# Patient Record
Sex: Male | Born: 1945 | Race: White | Hispanic: No | Marital: Married | Smoking: Former smoker
Health system: Southern US, Community
[De-identification: ages and names within clinical notes are randomized; demographics above are authoritative.]

## PROBLEM LIST (undated history)

## (undated) DIAGNOSIS — I2510 Atherosclerotic heart disease of native coronary artery without angina pectoris: Secondary | ICD-10-CM

## (undated) DIAGNOSIS — I4891 Unspecified atrial fibrillation: Secondary | ICD-10-CM

## (undated) DIAGNOSIS — I719 Aortic aneurysm of unspecified site, without rupture: Secondary | ICD-10-CM

## (undated) DIAGNOSIS — F329 Major depressive disorder, single episode, unspecified: Secondary | ICD-10-CM

## (undated) DIAGNOSIS — F32A Depression, unspecified: Secondary | ICD-10-CM

## (undated) DIAGNOSIS — I10 Essential (primary) hypertension: Secondary | ICD-10-CM

## (undated) DIAGNOSIS — J849 Interstitial pulmonary disease, unspecified: Secondary | ICD-10-CM

## (undated) DIAGNOSIS — C801 Malignant (primary) neoplasm, unspecified: Secondary | ICD-10-CM

## (undated) DIAGNOSIS — M3130 Wegener's granulomatosis without renal involvement: Secondary | ICD-10-CM

## (undated) DIAGNOSIS — E785 Hyperlipidemia, unspecified: Secondary | ICD-10-CM

## (undated) DIAGNOSIS — G4730 Sleep apnea, unspecified: Secondary | ICD-10-CM

## (undated) DIAGNOSIS — I5042 Chronic combined systolic (congestive) and diastolic (congestive) heart failure: Secondary | ICD-10-CM

## (undated) DIAGNOSIS — M1990 Unspecified osteoarthritis, unspecified site: Secondary | ICD-10-CM

## (undated) HISTORY — DX: Unspecified atrial fibrillation: I48.91

## (undated) HISTORY — PX: EYE SURGERY: SHX253

## (undated) HISTORY — DX: Chronic combined systolic (congestive) and diastolic (congestive) heart failure: I50.42

## (undated) HISTORY — DX: Essential (primary) hypertension: I10

## (undated) HISTORY — DX: Aortic aneurysm of unspecified site, without rupture: I71.9

## (undated) HISTORY — DX: Wegener's granulomatosis without renal involvement: M31.30

## (undated) HISTORY — PX: CORONARY ANGIOPLASTY: SHX604

## (undated) HISTORY — PX: CARPAL TUNNEL RELEASE: SHX101

---

## 1999-08-19 ENCOUNTER — Emergency Department (HOSPITAL_COMMUNITY): Admission: EM | Admit: 1999-08-19 | Discharge: 1999-08-19 | Payer: Self-pay | Admitting: Emergency Medicine

## 2001-05-26 ENCOUNTER — Ambulatory Visit (HOSPITAL_COMMUNITY): Admission: RE | Admit: 2001-05-26 | Discharge: 2001-05-27 | Payer: Self-pay | Admitting: Cardiology

## 2001-06-23 ENCOUNTER — Ambulatory Visit (HOSPITAL_COMMUNITY): Admission: RE | Admit: 2001-06-23 | Discharge: 2001-06-24 | Payer: Self-pay | Admitting: Cardiology

## 2001-06-23 ENCOUNTER — Encounter: Payer: Self-pay | Admitting: Cardiology

## 2002-06-03 ENCOUNTER — Inpatient Hospital Stay (HOSPITAL_COMMUNITY): Admission: EM | Admit: 2002-06-03 | Discharge: 2002-06-06 | Payer: Self-pay | Admitting: Emergency Medicine

## 2002-06-03 ENCOUNTER — Encounter: Payer: Self-pay | Admitting: Cardiology

## 2002-11-28 ENCOUNTER — Ambulatory Visit (HOSPITAL_COMMUNITY): Admission: RE | Admit: 2002-11-28 | Discharge: 2002-11-29 | Payer: Self-pay | Admitting: Cardiology

## 2003-03-08 ENCOUNTER — Ambulatory Visit (HOSPITAL_COMMUNITY): Admission: RE | Admit: 2003-03-08 | Discharge: 2003-03-08 | Payer: Self-pay | Admitting: Cardiology

## 2003-07-17 ENCOUNTER — Ambulatory Visit (HOSPITAL_COMMUNITY): Admission: RE | Admit: 2003-07-17 | Discharge: 2003-07-18 | Payer: Self-pay | Admitting: Cardiology

## 2003-12-20 ENCOUNTER — Ambulatory Visit: Payer: Self-pay | Admitting: Internal Medicine

## 2003-12-27 ENCOUNTER — Encounter: Admission: RE | Admit: 2003-12-27 | Discharge: 2003-12-27 | Payer: Self-pay | Admitting: Internal Medicine

## 2004-01-05 HISTORY — PX: CORONARY ARTERY BYPASS GRAFT: SHX141

## 2004-05-21 ENCOUNTER — Observation Stay (HOSPITAL_COMMUNITY): Admission: EM | Admit: 2004-05-21 | Discharge: 2004-05-29 | Payer: Self-pay | Admitting: Emergency Medicine

## 2004-10-01 ENCOUNTER — Ambulatory Visit: Payer: Self-pay | Admitting: Internal Medicine

## 2004-12-03 ENCOUNTER — Ambulatory Visit: Payer: Self-pay | Admitting: Internal Medicine

## 2004-12-08 ENCOUNTER — Ambulatory Visit: Payer: Self-pay | Admitting: Internal Medicine

## 2005-01-19 ENCOUNTER — Ambulatory Visit: Payer: Self-pay | Admitting: Internal Medicine

## 2005-03-01 ENCOUNTER — Ambulatory Visit: Payer: Self-pay | Admitting: Internal Medicine

## 2005-06-09 ENCOUNTER — Ambulatory Visit: Payer: Self-pay | Admitting: Internal Medicine

## 2005-12-23 IMAGING — CR DG CHEST 1V PORT
1 series · 1 of 1 positions shown · non-contrast
Comparison: 05/25/2004

CLINICAL DATA: CABG

PORTABLE CHEST - 1 VIEW:

[view not recorded]
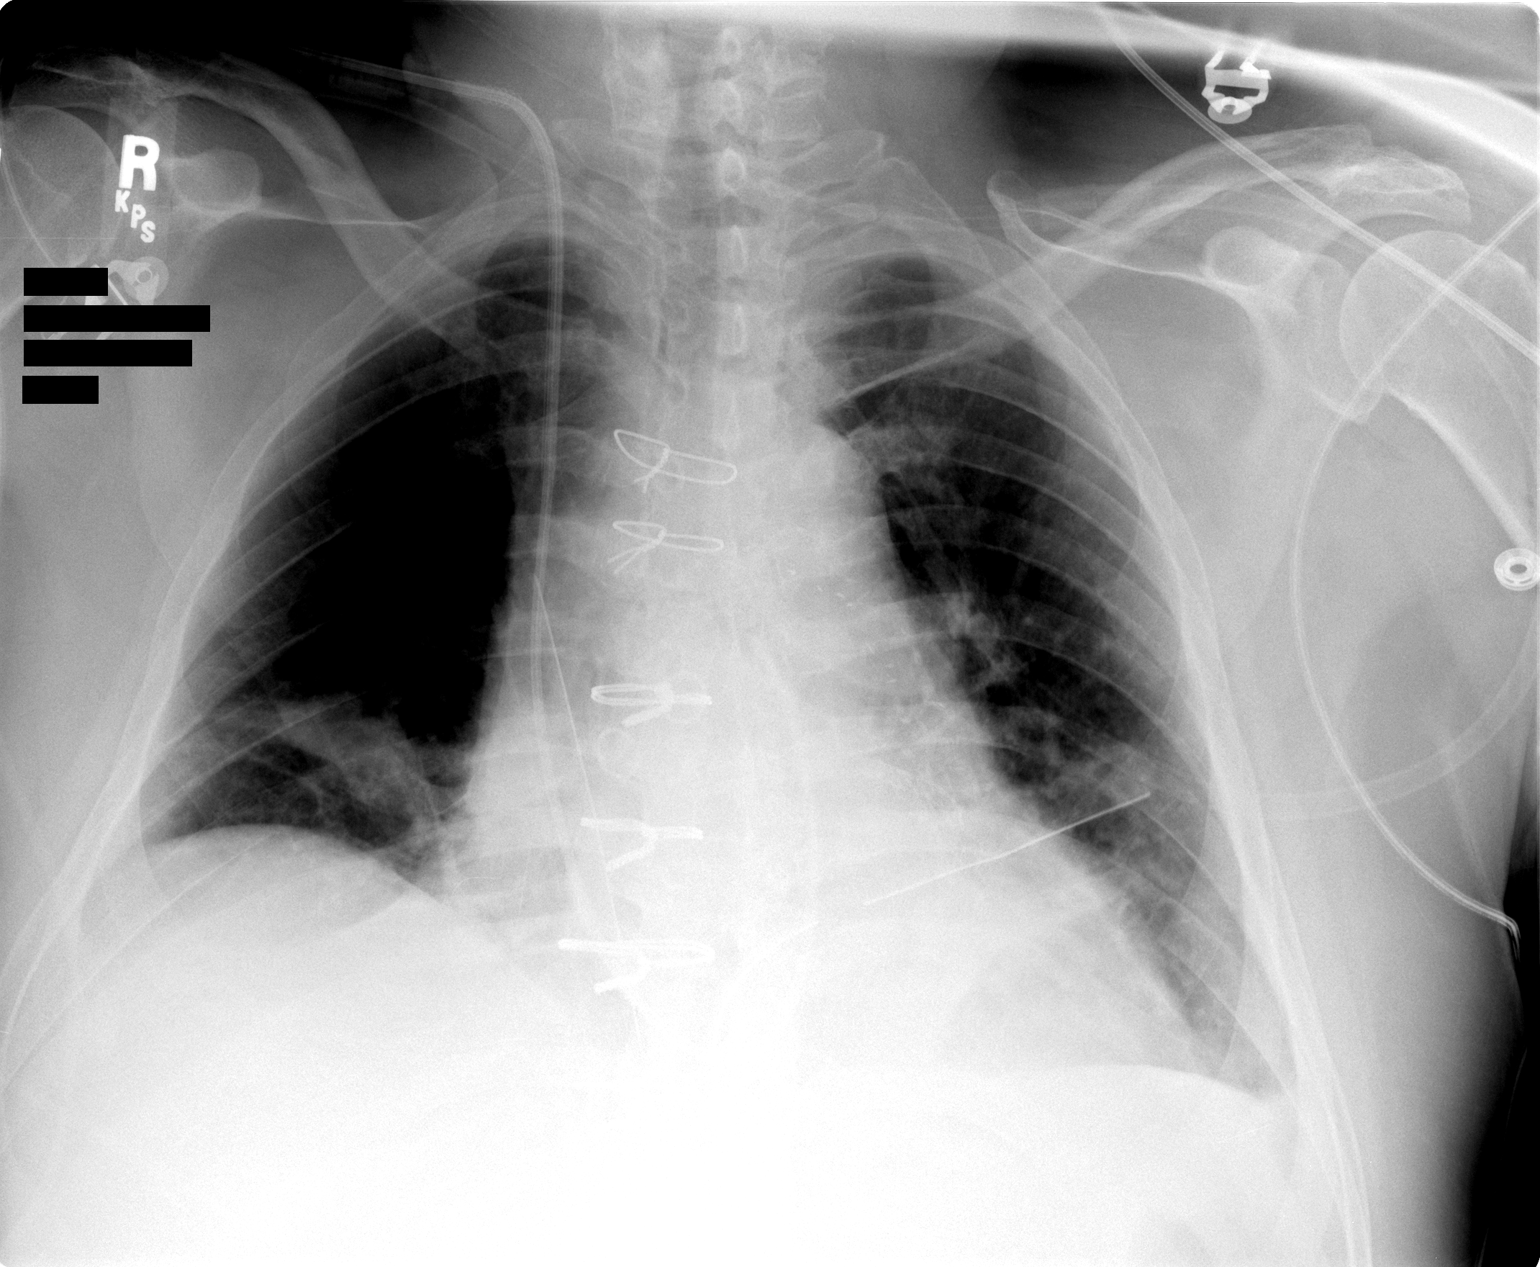

[1 of 1 positions shown; findings below may reference images not displayed]

FINDINGS: Interval removal of endotracheal tube and NG tube. The chest tube
remains in place without pneumothorax. Bibasilar atelectasis again noted. Small
left effusion likely present.
IMPRESSION: Interval intubation. Stable bibasilar atelectasis.

## 2006-11-22 ENCOUNTER — Encounter: Admission: RE | Admit: 2006-11-22 | Discharge: 2006-11-22 | Payer: Self-pay | Admitting: Internal Medicine

## 2010-02-05 ENCOUNTER — Other Ambulatory Visit: Payer: Self-pay | Admitting: Dermatology

## 2010-05-22 NOTE — Cardiovascular Report (Signed)
Gary [name], Gary [name]             ACCOUNT NO.:  [id number]   MEDICAL RECORD NO.:  [id number]          PATIENT TYPE:  INP   LOCATION:  3737                         FACILITY:  [facility]   PHYSICIAN:  Lyn Records III, M.D.DATE OF BIRTH:  [birth year]   DATE OF PROCEDURE:  05/22/2004  DATE OF DISCHARGE:                              CARDIAC CATHETERIZATION   INDICATION FOR CARDIAC CATHETERIZATION:  Unstable angina, progressive over  the past 2 weeks in this patient with history of extensive mid and distal  RCA stenting and proximal and mid LAD stenting primarily, and on 2  subsequent occasions for restenosis.   PROCEDURE PERFORMED:  1.  Left heart catheterization  2.  Selective coronary angiography.  3.  Left ventriculography.  4.  AngioSeal arteriotomy closure.   DESCRIPTION:  After informed consent, a 6-French sheath was placed in the  right femoral artery using a modified Seldinger technique.  A 6-French A2  multipurpose catheter was then used for hemodynamic recordings, left  ventriculography by hand injection, and selective right coronary  angiography.  A #4 left Judkins catheter was used for left coronary  angiography.  The patient tolerated the procedure without complications.  Following digital review the angiogram images, the case was terminated and  AngioSeal arteriotomy closure was performed without complications.   RESULTS:   I. HEMODYNAMIC DATA:  A.  Aortic pressure 155/79.  B.  Left ventricular pressure 156/25.   II. LEFT VENTRICULOGRAPHY:  Left ventricular cavity size and function are  normal.  EF is 55% to 65%.  No MR is noted.   III. CORONARY ANGIOGRAPHY:  A.  Left main coronary:  Widely patent.  B.  Left anterior descending coronary:  The LAD is severely and diffusely  diseased throughout the entire stented proximal and mid-vessel region.  There are multiple overlapping stents within this region.  Some of the  regions have 2 layers of stents.  The degree of  stenosis in multiple sites  is greater than 90%.  Flow to the distal LAD is TIMI grade 2.5 to 3.  Collaterals from the right coronary to the LAD are present.  The first  diagonal contains 90+ percent ostial narrowing.  C.  Circumflex artery:  The circumflex is a small- to moderate-sized vessel  that contains 60% to 70% proximal stenosis.  The circumflex gives origin to  a small first obtuse marginal.  D.  Ramus intermedius branch:  The ramus intermedius contains segmental 50%  proximal narrowing and 50% mid-narrowing.  No high-grade obstruction is  seen.  E.  Right coronary:  The right coronary is a large dominant vessel giving  PDA and at least 2 significant-sized left ventricular branches.  Proximal to  the long region of stents in the right coronary is concentric, relatively  focal 98% stenosis.  TIMI grade 3 flow is noted.   CONCLUSION:  1.  Severe two-vessel coronary artery disease with diffuse in-stent      restenosis involving the proximal and mid left anterior descending      stented regions and also high-grade obstruction in the proximal right      coronary  at the origin of stents within this vessel.  The ramus      intermedius contains moderate proximal disease and the circumflex is      small and also contains moderate proximal disease.  2.  Normal left ventricular function.  3.  Successful Angio-Seal.   PLAN:  CVTS consultation for surgical revascularization.       HWS/MEDQ  D:  05/22/2004  T:  05/23/2004  Job:  [id number]   cc:   Titus Dubin. Alwyn Ren, M.D. [facility]

## 2010-05-22 NOTE — H&P (Signed)
NAMEJAMY, CLECKLER NO.:  [id number]   MEDICAL RECORD NO.:  [id number]          PATIENT TYPE:  EMS   LOCATION:  MAJO                         FACILITY:  [facility]   PHYSICIAN:  Lyn Records, M.D.   DATE OF BIRTH:  1945/10/04   DATE OF ADMISSION:  05/21/2004  DATE OF DISCHARGE:                                HISTORY & PHYSICAL   ADMITTING PHYSICIAN:  Lyn Records, M.D.   PRIMARY CARDIOLOGIST:  [name], M.D.   PRIMARY CARE PHYSICIAN:  Titus Dubin. Alwyn Ren, M.D.   CHIEF COMPLAINT:  Chest pain.   HISTORY OF PRESENT ILLNESS:  Mr. Schappell is a 65 year old male patient of  Dr. Amil Amen, last seen in the office in December of 2005.  He has a history  of typically exertional chest pain when he has required any type of coronary  intervention.  He has had a PCI and stent to the distal RCA in 2003, PCI and  stent to the RCA and LAD in November of 2004, and a PCI and stent to the  proximal mid and distal LAD in July of 2005.  Please refer to those  respective cath reports for details.  He comes into the ER today complaining  of a 2-week history of intermittent chest pain, left anterior chest,  radiating to the left axilla with no specific pattern.  He says sometimes  the pain will occur at rest, sometimes it occurs with exertion, sometimes he  can do the same exertion without experiencing a reproduction of this pain.  He does report that this chest pain is similar to his chest pain at present  in July of 2005, in which he again had stent placement done.  In the past  one week, he has also noticed some increased chest pain after eating.  This  pain usually goes away with rest.  If it does not go away within 3 minutes,  he will take a nitroglycerin, and each time he has taken a nitroglycerin,  the pain has resolved.  He has had no episodes of prolonged chest pain  greater than 3 minutes for several hours.  Over the past 2 nights, he has  slept in a chair secondary to  onset of chest pain when supine.  He has not  had any associated symptoms such as shortness of breath, diaphoresis, or  nausea with this pain.  No dizziness or tachypalpitations.  At the follow up  in December of 2005 with Dr. Amil Amen, he was scheduled for an outpatient  stress Cardiolite to be done in early 2006, but he had to cancel this due to  some work constraints.  The EKG that was done in the ER is actually improved  when compared to the EKG in July of 2005.  He is currently chest pain free  since he took nitroglycerin prior to arrival in the ER.  No additional  laboratory analysis has been done by the ER physician.   REVIEW OF SYSTEMS:  Noncontributory.  No constitutional symptoms.  No recent  fevers, chills, coughs, cold, or flu.  No myalgias.  No  reflux or  indigestion.  No abdominal pain.  No dark/tarry stools.  No bloody stools.  No hematuria.  No dysuria.  No significant lower extremity swelling.  Some  mild trace swelling over the past 24 hours, mainly were the socks come in at  the calves.  Again, no dizziness, syncope, near syncope, tachypalpitations,  dyspnea on exertion, or orthopnea.   FAMILY MEDICAL HISTORY:  Positive for both parents with CAD.   SOCIAL HISTORY:  The patient is married.  His wife works here at [employer].  He is a former tobacco user, and does not drink alcohol.   PAST MEDICAL HISTORY:  1.  Stent to distal RCA in 2003.  2.  Stent to the RCA and LAD in November 2004.  3.  Stent to the proximal mid and distal LAD in July of 2005.  4.  Normal left ventricular ejection fraction of 66% per cath in July of      2005.  5.  Hypertension.  6.  Bradycardia secondary to medications.  7.  Dyslipidemia.  8.  Obesity.  9.  Former tobacco abuse.  10. Hypothyroidism.   ALLERGIES:  No known drug allergies.   CURRENT MEDICATIONS:  1.  Synthroid 50 mcg daily.  2.  Norvasc 5 mg daily.  3.  Lipitor 20 mg daily.  The patient states he thought Dr. Amil Amen  had told      him to double up on this medicine, so he is taking it twice daily.  4.  Zetia 10 mg daily.  5.  Coreg 12.5 mg b.i.d.  6.  Lexapro 10 mg daily.  7.  Stopped Plavix in January of 2006.  8.  Sublingual nitroglycerin p.r.n.   PHYSICAL EXAMINATION:  GENERAL:  Pleasant, well-developed male currently  without chest pain.  VITAL SIGNS:  Temperature 98.7, blood pressure 147/82, pulse 58 and regular,  respirations 18.  HEENT:  Head is normocephalic.  Sclerae are noninjected.  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  NECK:  Supple without adenopathy.  NEUROLOGIC:  The patient is alert and oriented x3, moving all extremities  x4.  No focal neurological deficits.  Strength is 5/5 and symmetrical.  Gait  was not assessment.  CHEST:  Bilateral lung sounds are clear to auscultation posteriorly.  Respiratory effort is non-labored.  CARDIAC:  Heart sounds are S1 and S2.  No rubs, murmurs, thrills, or  gallops.  No JVD.  Carotids 2+ bilaterally without bruits.  ABDOMEN:  Obese, soft, nontender, nondistended, with bowel sounds present in  all four quadrants.  No obvious hepatosplenomegaly, masses, or bruits.  EXTREMITIES:  Symmetrical in appearance with trace edema in the legs;  otherwise, no edema.  Pulses are palpable at 1+ bilaterally, radial,  femoral, and pedal.   LABORATORY DATA:  None have been ordered in the ER.   DIAGNOSTICS:  EKG in the ER today shows sinus bradycardia, ventricular rate  55, __________ R waves in the septal leads.  No ischemic changes.  EKG from  July of 2005 revealed sinus bradycardia with subtle ST elevation and T wave  inversion in V2 and V3.  Myocardial perfusion studies from 2003 showed a  fixed partially reversible inferior wall defect.  Myocardial perfusion study  from July of 2005 revealed the patient had positive stress changes on the EKG, which were not described in the body of the catheterization report, and  I do not have  that actual perfusion study report available at the time  of  dictation.  He also had widespread reversible anteroseptal and apical defect  noted on the perfusion studies.   IMPRESSION:  1.  Chest pain, probable unstable angina, in a patient with known two vessel      coronary artery disease, currently chest pain free.  2.  Hypertension.  3.  Dyslipidemia.  4.  Former tobacco abuse.  5.  Hypothyroidism.  6.  Obesity.  7.  Normal left ventricular ejection fraction with normal systolic function,      ejection fraction 66%.  8.  Bradycardia secondary to medications.   PLAN:  1.  Admit patient to the telemetry unit to Dr. Katrinka Blazing in Dr. Amil Amen'      absence.  2.  Perform serial cardiac isoenzymes and a stress Cardiolite in the      morning.  3.  Continue his home medications; add aspirin.  4.  Lovenox subcutaneous q.12h. per pharmacy.  5.  P.R.N. sublingual nitroglycerin; if has recurrent episodes of chest      pain, will need to add IV nitroglycerin instead.  6.  Will go ahead and check a fasting lipid panel since this is due.  7.  Get baseline CBC, CMET, PT, PTT, and two view chest x-ray.       ALE/MEDQ  D:  05/21/2004  T:  05/21/2004  Job:  [id number]   cc:   Titus Dubin. Alwyn Ren, M.D. [facility]

## 2010-05-22 NOTE — Discharge Summary (Signed)
Gary [name], Gary [name]             ACCOUNT NO.:  [id number]   MEDICAL RECORD NO.:  [id number]          PATIENT TYPE:  INP   LOCATION:  2001                         FACILITY:  [facility]   PHYSICIAN:  Salvatore Decent. [name], M.D.DATE OF BIRTH:  Feb 18, 1945   DATE OF ADMISSION:  05/21/2004  DATE OF DISCHARGE:  05/29/2004                                 DISCHARGE SUMMARY   HISTORY OF PRESENT ILLNESS:  The patient is a 65 year old male patient of  Dr. Amil Amen who is seen in the office in December 2005.  He has a history of  typically exertional chest pain when he has required any type of coronary  intervention.  He has had a PCI and stent of the distal right coronary  artery in 2003, PCI and stent of the right coronary artery and LAD in  November 2004, and a PCI and stent of the proximal mid and distal LAD in  July 2005.  He presented to the emergency room on the day of admission  complaining of a two week history of intermittent chest pain in the left  anterior chest with radiation into left axilla with no noted specific  pattern.  Sometimes, the pain would occur at rest, sometimes with exertion,  but at other times a similar exertion would not result with pain.  The pain  is, however, described as similar to the chest pain on presentation in July  2005 at which time he had a stent placed.  In the past week prior to  admission, he noticed some increased chest pain after eating.  The pain  usually went away with rest.  If the pain did not go away within three  minutes, he would take nitroglycerin and each time he took this, the pain  fully resolved.  He had no episodes of prolonged chest pain greater than  three minutes for several hours.  On the two nights prior to admission, he  had slept in a chair secondary to the onset of chest pain when supine.  He  did not have associated symptoms such as shortness of breath, diaphoresis,  or nausea with this pain.  He denied dizziness or tachy  palpitations.  He  was noted to have an outpatient stress Cardiolite to be done in early 2006  but this was cancelled due to work constraints.  EKG done in the emergency  room  was actually improved when compared to an EKG done in July 2005.  He  was felt to require admission, seen in consultation by Dr. Katrinka Blazing, and  admitted to the hospital.   PAST MEDICAL HISTORY:  1.  Coronary artery disease as described above.  2.  Left ventricular  ejection fraction 66% on catheterization July 2005.  3.  Hypertension.  4.  History of bradycardia secondary to medications.  5.  Dyslipidemia.  6.  Obesity.  7.  Former tobacco abuse.  8.  Hypothyroidism.   ALLERGIES:  No known drug allergies.   MEDICATIONS:  1.  Synthroid 50 mcg daily.  2.  Norvasc 5 mg daily.  3.  Lipitor 20 mg b.i.d.  4.  Zetia  10 mg daily.  5.  Coreg 12.5 mg b.i.d.  6.  Lexapro 10 mg daily.  7.  Stopped taking Plavix in January 2006.  8.  Sublingual nitroglycerin p.r.n.   For family history, social history, review of systems, and physical exam,  please see the history and physical done at the time of admission.   HOSPITAL COURSE:  The patient was admitted to the hospital for further  evaluation and treatment as described.  The patient was felt to require CVTS  evaluation.  The patient was seen initially by Dr. Laneta Simmers and subsequently  referred to Dr. [name] due to scheduling.  A cardiac catheterization  was done on May 22, 2004.  This showed several findings including diffuse  LAD in stent restenosis and severe proximal right coronary lesion, mild to  moderate circumflex proximal disease, and normal left ventricular function.  After reviewing these findings, the procedure was scheduled and on May 25, 2004, was taken to the operating room where he underwent coronary artery  bypass grafting x 3 with the following grafts placed:  1.  Left internal mammary artery to the LAD.  2.  Saphenous vein graft to the posterior  descending artery.  3.  Saphenous vein graft to the obtuse marginal.   The patient tolerated the procedure well and was taken to the surgical  intensive care unit in stable condition.  Postoperative hospital course, the  patient did quite well.  He remained hemodynamically stable.  He was weaned  from the ventilator without difficulties.  His laboratory values revealed a  moderate postoperative anemia.  His most recent hemoglobin and hematocrit  May 28, 2004, are 8.9 and 26 respectively.  The patient is tolerating a  gentle diuresis which will continue post discharge.  All incisions are  healing well without evidence of infection.  He is tolerating routine  activities commiserate with level of postoperative convalescence,  progressing well with cardiac rehabilitation phase 1 modalities.  The  patient's routine lines, monitors, and drains have all been discontinued in  a standard fashion.  Overall, he is felt to be stable for tentative  discharge on the morning of May 29, 2004, pending morning round evaluation.   MEDICATIONS ON DISCHARGE:  1.  Lipitor 40 mg daily.  2.  Lexapro 10 mg daily.  3.  Zetia 10 mg daily.  4.  Synthroid 50 mcg daily.  5.  Aspirin 325 mg daily.  6.  Coreg 6.25 mg b.i.d.  7.  Lasix 40 mg daily for seven days.  8.  K-Dur 20 mEq daily for seven days.  9.  Ultram 50 mg q.4-6h. p.r.n. needed for pain.   DISCHARGE INSTRUCTIONS:  The patient is to follow up with Dr. Amil Amen in two  weeks, Dr. [name] on June 19, 2004, at 11:45.  The patient received  written instructions regarding medications, activity, diet, wound care, and  follow up.   FINAL DIAGNOSIS:  Severe multi-vessel coronary artery disease as described  status post multiple percutaneous procedures and now status post surgical  revascularization.   OTHER DIAGNOSES:  1.  Hypertension.  2.  Dyslipidemia.  3.  Obesity.  4.  Former tobacco abuse. 5.  Hypothyroidism.  6.  Postoperative  anemia.      WEG/MEDQ  D:  05/28/2004  T:  05/28/2004  Job:  [id number]   cc:   Gary [name]. Alwyn Ren, M.D. [facility]   [name], M.D.  [address]  [address]  [place]  [place] [address]  Fax: [phone]

## 2010-05-22 NOTE — Cardiovascular Report (Signed)
NAME:  Gary [name], Gary [name]                       ACCOUNT NO.:  [id number]   MEDICAL RECORD NO.:  [id number]                   PATIENT TYPE:  OIB   LOCATION:  6525                                 FACILITY:  [facility]   PHYSICIAN:  [name], M.D.               DATE OF BIRTH:  12/16/1945   DATE OF PROCEDURE:  07/17/2003  DATE OF DISCHARGE:                              CARDIAC CATHETERIZATION   PROCEDURE PERFORMED:  1. Left heart catheterization.  2. Coronary angiography.  3. Left ventriculogram.  4. PCI/drug-eluting stent implantation x2.  5. Intravascular ultrasound.  6. Percutaneous closure of right femoral artery.   INDICATION:  Mr. Gary [name] is an unfortunate 65 year old man who has  two-vessel coronary disease and is status post three previous interventions  in the anterior descending artery and two interventions in the right  coronary artery.  Despite having a reasonably normal appearing angiogram  with wide patency of the stented segments in early March 2005 at which time  he was complaining of relatively typical angina he has had a recrudescence  and then a worsening of his angina pectoris over the past month. A  myocardial perfusion study with exercise in the office was positive  electrocardiographically and clinically.  As well, there was a relatively  widespread reversible anteroseptal and apical defect.  The more apical  portion of the defect, however, was relatively fixed.  He is brought now to  the cardiac catheterization laboratory again to identify progressive  disease, restenosis, and provide for further therapeutic options.   PROCEDURAL NOTE:  The patient was brought to the cardiac catheterization  laboratory in the fasting state.  The right groin was prepped and draped in  the usual sterile fashion.  Local anesthesia was obtained with the  infiltration of 1% lidocaine.  A 5 French catheter sheath was inserted  percutaneously into the right femoral artery  utilizing an anterior approach  over a guiding J wire.  A 110-cm pigtail catheter was used to measure  pressures in the ascending aorta and in the left ventricle both prior to and  following the ventriculogram.  A 30-degree RAO cine left ventriculogram was  performed utilizing a power injector.  42 mL of contrast material were  injected at 13 mL per second.  The pigtail catheter was then exchanged for a  5 French #4 left Judkins catheter.  Cineangiography of the left coronary  artery was conducted in multiple LAO and RAO projections.  The left Judkins  catheter was then exchanged for a 6 French #4 right Judkins catheter.  Cineangiography of the right coronary artery was conducted in multiple LAO  and RAO projections.  All catheter manipulations were performed using  fluoroscopic observation and exchanges performed over long guiding J wire.   I reviewed the cineangiograms and previous studies and then discussed the  clinical situation with the patient.  There was focal and diffuse restenosis  in the mid  and distal portion of the extensively stented segment in the left  anterior descending artery as would be expected.  Options were given for  repeat (#4) percutaneous intervention versus single vessel coronary bypass.  Left circumflex and right coronary arteries while diseased were widely  patent.  It was discussed that the likelihood of re-restenosis in this  vessel is in the range of 30-40% despite an adequate angiographic result.  He still elected and wished to pursue an interventional option.  Therefore,  we prepared for percutaneous coronary intervention.   The 5 French catheter sheath was exchanged for a 7 French catheter sheath.  The patient received 5500 units of heparin intravenously and a bolus of  Aggrastat at 25 mcg/kg followed by a constant infusion of 0.1 mcg/kg/minute.  This resulted in an ACT of 263 seconds.  A 7 [place] CLS #4 guiding catheter  was advanced through the  ascending aorta where the left coronary os was  engaged.  A 0.014 inch Scimed Luge intracoronary guide wire was passed  across the lesion without difficulty.  Initial balloon dilatation was  performed with a 3.0/15 mm Scimed Maverick intracoronary balloon.  This was  inflated to 8 atmospheres for approximately one minute.  This balloon was  removed and intravascular ultrasound performed with a Scimed Altantis  catheter. A full mechanical pullback run was obtained.  This revealed  extensive atherosclerotic disease which was physiologically significant in  the very distal portion of the stent and extending slightly distal from that  point.  The stented segment measured in the 2.5 x 3.0 range throughout the  remainder.  There was extensive in-stent restenosis tissue present.  I then  proceeded with cutting balloon angioplasty.  A 3.25/15 mm cutting balloon  was advanced into the distal segment and allowed to extend slightly beyond  that to treat the more distal native lesion.  He was then inflated there to  5 atmospheres for approximately 2 minutes.  It was deflated and withdrawn  and inflated four more times through the extended portion of the stent which  was over at least 40 mm.  Each inflation was 8 atmospheres for approximately  2 minutes.  The cutting balloon was removed and initial angiography revealed  what appeared to be a degree of tight coronary spasm distal to the stented  segment.  However, it was resistant to intracoronary nitroglycerin and  intracoronary Verapamil.  The 3.0/15 mm balloon was advanced to this portion  which was distal to the stented segment and inflated to approximately 2-3  atmospheres for approximately one minute.  This balloon was deflated and  removed.  However, it did not resolve what was presumed to be intracoronary  spasm.  In fact, it was not obvious that this was a distal dissection.  Therefore, the Maverick balloon was removed and I prepared for  intracoronary stenting.  A 3.0/32 mm Taxus intracoronary drug-eluting stent was then  advanced into place and extended distally to cover the distal dissection.  It was inflated at this region to 9 atmospheres for approximately one  minute.  This Taxus balloon was again inflated to 9 atmospheres again for  about 30 seconds and then it was removed.  A second Taxus intracoronary  stent measuring 3.5 x 28-mm was advanced in the more proximal section of the  lesion and inflated there to 14 atmospheres for approximately one minute.  Intravascular ultrasound was repeated.  These images revealed wide stent  patency and good apposition with luminal dimensions measuring from  2.7 to  3.1 mm distally to 3.0 x 3.0 mm proximally.  Therefore, a 3.25/15 mm [place]  Monorail balloon was advanced into the distal segment and inflated twice  there to a peak pressure of 16 atmospheres for approximately 30 seconds  each.  This was removed and exchanged for a 3.5/15 mm [place] Monorail which was  inflated in the proximal segment twice to 16 atmospheres for approximately  30 seconds each.  Intravascular ultrasound was repeated and there was  improved luminal dimension especially throughout the more proximal and mid  portion of the stented segment which now extended about 60 mm.  The lumen  measured 3.4 x 3.3 proximally, 3.2 x 3.1 in the mid portion.  However,  distally it was still in the 2.6 x 2.6 mm range.  Therefore, the 3.25/15 mm  [place] Monorail was readvanced in the distal segment.  However, it ruptured at  approximately 8 atmospheres.  It was therefore removed and a 3.25/9 mm [place]  Monorail (only length available in this size) was advanced in the distal  segment and inflated three times over approximately 15 mm of stent to a  maximum pressure of 16 atmospheres.   These maneuvers ultimately resulted in no significant stenosis being evident  angiographically.  In fact, the stent appeared to taper nicely from about  3.5 mm  to 3.0 mm distally.  There was TIMI-3 flow.  The patient received an  additional mcg of IC nitroglycerin and repeat angiography performed both  with and without guide wire in place orthogonally.  The guiding catheter was  then removed and a 45-degree RAO cineangiogram of the femoral artery was  performed via the catheter sheath after it was flushed well.  Final ACT was  obtained and found to be 222 seconds.  The right femoral cineangiogram  documented the arteriotomy site to be well above the bifurcation into the  profunda femoris and superficial femoral arteries.  There was no significant  atherosclerotic disease or obstruction in the femoral artery.  I then  proceeded with percutaneous closure utilizing the Angio-Seal device which  was entirely successful.  The patient was then transferred to the recovery  area in stable condition with an intact distal pulse.   HEMODYNAMICS:  Systemic arterial pressure was 135/75 with a mean of 100 mmHg.  There was no systolic gradient across the aortic valve.  The left  ventricular end-diastolic pressure was 16 mmHg pre ventriculogram.   ANGIOGRAPHY:  The left ventriculogram demonstrated normal chamber size and  normal global systolic function with apical akinesis present.  A widely  stented segment in the proximal and mid LAD as well as proximal mid and  distal right coronary were obvious.  There was no significant coronary  calcification.  The aortic valve was trileaflet and opened normally during  systole.  There was no mitral regurgitation.  The calculated ejection  fraction utilizing a single plane cine method was 66%.   There was a right dominant system present.  The left main left coronary  contained a 30% mid vessel focal stenosis.   The left anterior descending artery and its branches were highly diseased.  The stented segment which extends over about 35-40 mm contained a 99% mid  portion stenosis and diffuse restenosis in the more distal  segment of the  stent extending over about 15 mm.  However, this was underfilled because of  the tight more proximal stenosis.  Several small diagonals and septal  perforators arise all of which have  ostial disease especially within the  stented segment.  The ongoing anterior descending was underfilled, but did  reach and barely traversed the apex.   The left circumflex coronary artery amounted to really a single large ramus  intermedius which was widely patent and had diffuse disease with a 25%  proximal narrowing extending over about 15 mm.  The left circumflex ongoing  itself was small and gave rise to only a small marginal branch that did not  leave the basal portion of the LV.   The right coronary artery and its branches were diseased, but widely patent.  The stented segment extends from the crux to the mid portion of the right  coronary.  This stent is widely patent.  There is a 30% tubular stenosis.  The vessel branches into a moderate size posterior descending artery and a  moderate size posterior lateral segment with two small to moderate size left  ventricular branches.  Again, no significant obstructions are seen in these  distal branches.  There is mild in-stent restenosis, but not greater than  25% seen.   Collateral vessels are seen from the distal posterior descending artery to  the distal LAD via the septal arcade.   INTRAVASCULAR ULTRASOUND:  Please see above discussion within the procedure  note.  It should be noted that all stented segments were widely expanded and  there was excellent apposition to the arterial wall within the layered  stented segments.   FINAL IMPRESSION:  1. Atherosclerotic coronary vascular disease, two vessel.  2. Status post successful extensive drug-eluting stent     implantation/percutaneous coronary intervention in the proximal, mid and     distal portion of the left anterior descending artery extending over     approximately 60 mm. 3.  Intact left ventricular size and global systolic function with regional     wall motion abnormality as noted.  4. Typical angina was reproduced with device insertion and balloon     inflation.                                               [name], M.D.    JHE/MEDQ  D:  07/17/2003  T:  07/17/2003  Job:  [id number]   cc:   Titus Dubin. Alwyn Ren, M.D. [facility]

## 2010-05-22 NOTE — Cardiovascular Report (Signed)
NAME:  Gary [name], Gary [name]                       ACCOUNT NO.:  [id number]   MEDICAL RECORD NO.:  [id number]                   PATIENT TYPE:  INP   LOCATION:  2038                                 FACILITY:  [facility]   PHYSICIAN:  [name], M.D.               DATE OF BIRTH:  1945/06/12   DATE OF PROCEDURE:  06/05/2002  DATE OF DISCHARGE:                              CARDIAC CATHETERIZATION   PROCEDURES:  1. Left heart catheterization.  2. Coronary angiography.  3. Left ventriculogram.  4. Percutaneous transluminal coronary angioplasty/cutting balloon mid left     anterior descending.   INDICATIONS:  The patient is a 65 year old man who is now one year PTCA  stent, LAD and RCA.  The RCA was for a presentation with unstable angina.  He has had recurrent atypical angina with left axillary, left arm discomfort  both with exertion and at rest.  He has been admitted to the hospital with  these symptoms, and myocardial infarction ruled out.  He is brought now to  the catheterization laboratory at this time to identify the extent of  disease and provide for further therapeutic options.   PROCEDURAL NOTE:  The patient was brought to the cardiac catheterization  laboratory in the postabsorptive state.  The right groin was prepped and  draped in the usual sterile fashion.  Local anesthesia was attained with  infiltration of 1% lidocaine.  A 6-French catheter sheath was inserted  percutaneously into the right femoral artery utilizing and anterior  posterior guiding J-wire.  A 6-French 110 cm pigtail catheter was used to  measures pressures in the ascending aorta and in the left ventricle, both  prior to and following the ventriculogram.  A 30-degree RAO cine left  ventriculogram was performed utilizing a power injector.  Following the  sublingual administration of 0.4 mg of nitroglycerin, cine angiography of  each coronary artery was conducted in multiple LAO and RAO projections  utilizing  a 6-French #4 left and right Judkins catheters.  We then proceeded  with coronary intervention.  A 6-French #4 CLS guiding catheter was advanced  to the ascending aorta where the left coronary os was engaged.  A 0.014 inch  Scimed luge intracoronary guidewire was passed across the lesion and the mid-  LAD without significant difficulty.  The patient had previously received  5700 units of heparin intravenously, and a double bolus of Integrilin  followed by a constant infusion.  He had also been given Plavix since his  admission through the emergency room.   A 3.25/10 mm Scimed cutting balloon was then advanced across the lesion and  carefully positioned.  It was inflated there to a peak pressure of 8  atmospheres for a peak duration of 2.5 minutes.  This was deflated and  removed, and followed by a 3.25/20 mm Scimed Quantum Maverick intracoronary  balloon.  This was positioned in the same region, and inflated to a  peak  pressure of 16 atmospheres for one minute.  Following confirmation of  adequate patency, __________ views both with and without the guidewire in  place, the guiding catheter was removed.  Hemostasis was achieved by  suturing the sheath in place.  The patient was transported to the recovery  area in stable condition with an intact distal pulse.  The initial ACT was  248 seconds.  The closing ACT was 323 seconds.   HEMODYNAMICS:  Systemic arterial pressure was 172/88 with a mean of 120  mmHg.  There was no systolic gradient across the aortic valve.  The left  ventricular end diastolic pressure was 22 mmHg.   ANGIOGRAPHY:  The left ventriculogram demonstrated normal chamber size and  normal global systolic function without regional wall motion abnormality.  There was no significant mitral regurgitation.  A visually estimated  ejection fraction is 60%.  A long stented segment in the anterior descending  artery and in the right coronary is easily visible.  There was no  anatomic  abnormality of the aortic valve, and there was no significant mitral  regurgitation.   There was a right dominant coronary system present.  The main left coronary  artery showed a 10% mid vessel narrowing.  The left anterior descending  artery and its branches were highly diseased.  Two overlapping stents extend  from the junction of the mid and distal segment to the proximal portion of  the vessel.  In the middle of this stented segment, there is a focal 95%  stenosis.  It does involve the origin of the second septal perforator.  Two  diagonal branches arise from the proximal and mid portion, neither one of  which is more than small to moderate in size.  The ongoing anterior  descending artery reaches and barely traverses apex.   The left circumflex coronary artery and its branches show some diffuse  narrowing proximally, and this is in a proximal portion of a large ramus  intermedius.  This is the dominant lateral wall vessel.  The circumflex  artery itself is small and gives rise to two small marginal branches.  The  degree of narrowing in the proximal ramus intermedius is approximately 30%.   The right coronary artery and its branches were moderately diseased.  This  vessel contains a 40% tubular mid vessel stenosis.  The stented segment in  the distal and mid portion is widely patent.  There is a proximal 30%  narrowing as well.  The vessel bifurcates distally into a moderate-sized  posterior descending artery and moderate-sized posterolateral segment.  There are two left ventricular branches that are small to moderate in size,  and none of these distal right coronary branches contain any significant  obstructions.   Collateral vessels are not seen.   Following cutting balloon dilatation of the left anterior descending artery,  there was 10% residual stenosis, and there was transient occlusion of the second septal perforator.   FINAL IMPRESSION:  1. Atherosclerotic  coronary vascular disease, two vessel.  2. Status post successful cutting balloon PTCA, mid anterior descending     artery for focal in-stent restenosis, late.  3. Atypical angina was reproduced with the vice insertion and balloon     inflation.  4. Intact left ventricular size and global systolic function with ejection     fraction of 60%.  [name], M.D.    JHE/MEDQ  D:  06/05/2002  T:  06/06/2002  Job:  [id number]   cc:   Titus Dubin. Alwyn Ren, M.D. [facility]   Cardiac Catheterization Laboratory

## 2010-05-22 NOTE — Cardiovascular Report (Signed)
[facility]  Patient:    Gary [name], HAIL Visit Number: [id number] MRN: [id number]          Service Type: CAT Location: [id number] Attending Physician:  Corliss Marcus Dictated by:   [name], M.D. Proc. Date: 05/26/01 Admit Date:  05/26/2001 Discharge Date: 05/27/2001   CC:         Titus Dubin. Alwyn Ren, M.D. [facility]  Cardiac Catheterization Laboratory   Cardiac Catheterization  PROCEDURES PERFORMED: 1. Left heart catheterization. 2. Coronary angiography. 3. Left ventriculogram. 4. Percutaneous transluminal coronary angioplasty/drug-eluting stent    implantation, distal right coronary artery x2.  INDICATIONS: The patient is a 65 year old man who has developed exertional chest, mid substernal burning discomfort, especially with exertion, but occasionally at rest. He has had several episodes even after a heavy meal, but at rest. A myocardial perfusion study with exercise was undertaken, and this revealed a primarily fixed partially reversible inferior wall defect. He is brought now to the cardiac catheterization laboratory to identify the extent of disease and provide for further therapeutic options.  DESCRIPTION OF PROCEDURE: Left heart catheterization was performed following the percutaneous insertion of a 5 French catheter sheath utilizing an anterior approach over a guiding J wire into the right femoral artery. A 110 cm pigtail catheter was used to measure pressures in the ascending aorta and in the left ventricle both prior to and following the ventriculogram. A 30-degree RAO cine left ventriculogram was performed utilizing a power injector. Following the sublingual administration of 0.4 mg nitroglycerin, cineangiography of each coronary artery was conducted in multiple LAO and RAO projections utilizing 5 [place] #4 left and right Judkins catheters. All catheter manipulations were performed using fluoroscopic observation and  exchanges performed over a long guiding J wire.  We then prepared for coronary intervention. The 5 French catheter sheath was exchanged over a long guiding J wire for a 6 French catheter sheath. The patient received 79.5 mg of Angiomax/bivalrudin as well as a continuous infusion during the procedure. A 6 [place], Kathryne Gin, right, #4 horizontal, Mach 1, Scimed guiding catheter was advanced to the ascending aorta where the right coronary os was engaged. A 0.014 inch Scimed luge intracoronary guide wire was passed across the lesion in the distal right coronary with some difficulty. Initial balloon dilatation was performed using a 2.5/15 mm ACS Guidant CrossSail. The balloon was inflated to 8 atmospheres for 1 minute. It was removed and a 3.0/18 mm Cypher, Cordis, drug-eluting intracoronary stent was advanced across the lesion in the distal right coronary and at the crux of the vessel. After carefully positioning it, it was deployed at a peak pressure of 16 atmospheres for 1 minute. This balloon was removed and a second Cordis, Cypher drug-eluting stent, 3.0/33 mm was manipulated into position in the distal right coronary with about 3 mm of overlap with the more distal stent. This stent also was deployed at 16 atmospheres for 1 minute. The balloon was deflated and advanced slightly and again inflated to 20 atmospheres for 30 seconds. The balloon was removed and following conformation of adequate patency in orthogonal views both with and without the guide wire in place, the guiding catheter was removed. Hemostasis was achieved by suturing the sheath in place and the patient was transported to the recovery area in stable condition. ACT at close was 310 seconds.  HEMODYNAMICS: Systemic arterial pressure was 149/75 with a mean of 104 mmHg. There was no systolic gradient across the aortic valve. The left ventricular  end-diastolic pressure was 20 mmHg.  LEFT VENTRICULOGRAM: The left ventriculogram  demonstrated normal chamber size and normal global systolic function with a visually estimated ejection fraction of 65-70%. There was inferobasal severe hypokinesis. Mild left coronary calcification is seen. There is no mitral regurgitation.  There was a right dominant coronary system present. The main left coronary artery was normal.  The left anterior descending artery and its branches were highly diseased. The vessel gives rise to a small first diagonal branch, which has a 80% stenosis at its ostium. The ongoing anterior descending artery then displays a diffuse 25 mm in length, 70% stenotic irregular stenosis that involves the origin of the second septal perforator. The midportion of the anterior descending gives rise to a moderate sized diagonal branch without significant obstruction. The ongoing anterior descending is large, transapical and diseased.  The left circumflex coronary artery and its branches were moderately diseased. The circumflex really amounts to a large ramus intermedius but has a diffuse 30-40% stenosis proximally. This is the major vessel in the distal lateral wall of the heart. The ongoing circumflex is small and gives rise only to a small single circumflex marginal. There does appear to be a third small marginal branch who origin arises from the circumflex about 5 mm up to the second marginal and this vessel is completely occluded filing by left to left collateral flow.  The right coronary artery and its branches are highly diseased. The vessel has a tubular 30% proximal stenosis. It then, in the midportion, displays a irregular, diffuse 50-70% stenosis, which extends well into the distal segment. At the crux, there is a 99% focal stenosis just before the origin of the posterolateral segment and the posterior descending artery. The posterolateral segment and posterior descending artery are small to moderate in size and without significant  obstruction.   Following balloon dilatation and stent dilatation, there was no stenosis in the distal portion of the artery. There was a 50% stenosis at the proximal end of the Cypher stent.  FINAL IMPRESSION: 1. Atherosclerotic coronary vascular disease, three-vessel. 2. Intact left ventricular size and global systolic function. Ejection    fraction 65-70%. 3. Status post successful percutaneous transluminal coronary angioplasty and    stent implantation in distal and mid right coronary artery. 4. Typical angina was reproduced with device insertion and balloon inflation. Dictated by:   [name], M.D. Attending Physician:  Corliss Marcus DD:  05/26/01 TD:  05/29/01 Job: [id number]

## 2010-05-22 NOTE — Cardiovascular Report (Signed)
NAME:  Gary [name], Gary [name]                       ACCOUNT NO.:  [id number]   MEDICAL RECORD NO.:  [id number]                   PATIENT TYPE:  OIB   LOCATION:  2899                                 FACILITY:  [facility]   PHYSICIAN:  [name], M.D.               DATE OF BIRTH:  1945-12-25   DATE OF PROCEDURE:  03/08/2003  DATE OF DISCHARGE:  03/08/2003                              CARDIAC CATHETERIZATION   PROCEDURES PERFORMED:  1. Left heart catheterization.  2. Coronary angiography.  3. Left ventriculogram.   CARDIOLOGIST:  [name], M.D.   INDICATIONS:  Mr. Gary [name] is a 65 year old man who has had multiple  revascularizations of the right and anterior descending arteries.  The last  procedure was in November 2004 wherein he had drug-eluting stents placed in  the anterior descending (stent sandwich) and right coronary artery.  He has  had recurrent angina pectoris described as a left subscapular discomfort  that radiates to the left chest and also discomfort in the right jaw.  This  has occurred with exertion on multiple occasions and resolves with rest.  Because of the recent procedures and the rather typical nature of his chest  discomfort he is brought now to the catheterization laboratory to identify  possible early restenosis or progressive disease.   PROCEDURAL NOTE:  The patient was brought to the cardiac catheterization  laboratory in the fasting state.  The right groin was prepped and draped in  the usual sterile fashion.  Local anesthesia was obtained with the  infiltration of 1% lidocaine.  A 5 French catheter sheath was inserted  percutaneously into the right femoral artery utilizing an anterior approach  over a guiding J-wire.  The 110 cm pigtail catheter was used to measure  pressures in the ascending aorta and in the left ventricle both prior to and  following the ventriculogram.  A 30-degree RAO cine left ventriculogram was  performed utilizing a  power injector.  Following the sublingual  administration of 0.4 mg nitroglycerin cine angiography of each coronary  artery was conducted in multiple LAO and RAO projections utilizing 5 [place]  #4 left and right Judkins catheters.   At the completion of the procedure the catheters and the catheter sheaths  were removed.  Hemostasis was achieved by direct pressure.  The patient was  transported to the recovery area in stable condition with an intact distal  pulse.   HEMODYNAMIC DATA:  Systemic arterial pressure was 149/106 mmHg.  There was  no systolic gradient across the aortic valve.  The left ventricular end  diastolic pressure was 13 mmHg pre- and post ventriculogram.   ANGIOGRAPHIC DATA:  The left ventriculogram demonstrated normal chamber size  and normal global systolic function without regional wall motion  abnormality.  A visual estimate of the ejection fraction is 55-60%.  There  is no mitral regurgitation.  Multiple metallic stents are easily visible in  the  midportion of the anterior descending artery and in the mid and distal  portions of the right coronary artery.  The aortic valve is trileaflet and  opens normally during systole.   There is a right dominant coronary system present.   The main left coronary artery is normal.   The left anterior descending artery is highly diseased, but adequately  patent.  There is approximately 30-35 mm of stent which extends from the  first diagonal branch into the mid-to-distal portion.  The more proximal  portion of this stented segment is where the previous stent was placed in  November.  The entire portion of the artery is widely patent.  There is a 20-  30% narrowing about 20 mm from the proximal opening of the stent.  This is  rather discrete and certainly not physiologically significant.  The ongoing  anterior descending artery reaches and barely traverses the apex.  There are  two small diagonal branches, which arise, both of  which have ostial stenoses  in the range of 70-80%.  There is a small second septal perforator that is  placed in stent jail.  This has a 90% stenosis at its origin.   The left circumflex coronary artery and its branches are diseased, but  widely patent.  The vessel really amounts to a single ramus intermedius that  has a diffuse 20-30% stenosis in the proximal segment.  It gives rise to a  very large vessel which is the major vessel on the lateral wall of the  heart.  There are no significant obstructions in the more distal portion of  this vessel.  The ongoing circumflex gives rise to a single small first  marginal branch and then is dimunitive in the AV groove.   The right coronary artery is diffusely diseased, but again widely patent.  There are multiple 20-30% stenoses throughout the proximal, mid and distal  portions of the vessel.  The vessel is stented from the junction of the  proximal and mid down to the crux.  The previously placed stent in November  in the midportion is widely patent.  The distal vessel bifurcates into a  moderate-to-large-sized posterior descending artery and then a moderate-  sized posterolateral segment.  The posterolateral segment gives rise to two  moderate-sized left ventricular branches.  There are no significant  obstructions in the PDA or posterolateral segment or branches.   Collateral vessels are not seen.   FINAL IMPRESSION:  1. Atherosclerotic coronary vascular disease, two-vessel.  2. Widely patent extensive stented segments of the right coronary and     midportion of the anterior descending artery.  3. No obvious physiologically significant lesion to explain recent onset of     symptoms.  He does have a small-to-moderate-sized sepal perforator in     stent jail, but this is not a new finding.   PLAN:  The patient was presented with this gratifying news.  He will receive continued medical therapy and consideration will be given to exercise   treadmill testing in the office to confirm these findings, and see if these  symptoms can be reproduced.                                               [name], M.D.    JHE/MEDQ  D:  03/08/2003  T:  03/09/2003  Job:  [id number]   cc:   Titus Dubin. Alwyn Ren, M.D. [facility]   Cardiac Catheterization Laboratory

## 2010-05-22 NOTE — Consult Note (Signed)
Gary [name], BRAR             ACCOUNT NO.:  [id number]   MEDICAL RECORD NO.:  [id number]          PATIENT TYPE:  INP   LOCATION:  3737                         FACILITY:  [facility]   PHYSICIAN:  Evelene Croon, M.D.     DATE OF BIRTH:  [birth year]   DATE OF CONSULTATION:  05/22/2004  DATE OF DISCHARGE:                                   CONSULTATION   REFERRING PHYSICIAN:  Dr. Elwanda Brooklyn. Katrinka Blazing, III.   PRIMARY CARDIOLOGIST:  Dr. [name].   REASON FOR CONSULTATION:  Three-vessel coronary artery disease with unstable  angina.   HISTORY OF PRESENT ILLNESS:  The patient is 65 year old gentleman with  history of coronary artery disease who underwent stenting of his distal  right coronary in 2003, followed by stenting of the right coronary artery  and LAD in November of 2004.  The patient subsequently underwent PCI and  stenting of the proximal, mid and distal LAD in July of 2005.  He reports  approximately a 2-week history of intermittent substernal chest pain  radiating to left axilla that has occurred typically with exertion, but also  at rest.  He has also noted pain after eating for the week prior to  admission.  All of his symptoms have been relieved with nitroglycerin within  a few minutes.  The past several nights prior to admission, he has slept in  a chair due to chest pain occurring when he is supine.  His enzymes were  negative.  He underwent cardiac catheterization today.  The LAD had diffuse  in-stent restenosis in the proximal and mid LAD of greater than 90%.  The  right coronary artery has 90% proximal stenosis just before the stented  area, as well as extensive disease within the mid and distal right coronary  stent.  The left circumflex had 50% proximal and mid stenosis.  Left  ventricular ejection fraction was normal with no mitral regurgitation.   REVIEW OF SYSTEMS:  GENERAL:  He denies any fever or chills.  He has had no  recent weight changes.  He has had  months of marked fatigue.  EYES:  Negative.  ENT:  Negative.  ENDOCRINE:  He denies diabetes.  He does have  hypothyroidism.  CARDIOVASCULAR:  As above.  He denies PND or orthopnea.  He  has had nocturnal chest pain.  He denies palpitations and peripheral edema.  RESPIRATORY:  He denies cough and sputum production.  GI:  He has had no  nausea or vomiting.  He denies melena or bright red blood per rectum.  GU:  He denies dysuria and hematuria.  MUSCULOSKELETAL:  He denies arthralgias  and myalgias.  ALLERGIES:  None.  PSYCHIATRIC:  Negative.  NEUROLOGICAL:  He denies any focal weakness  or numbness.  He denies dizziness or syncope.   PAST MEDICAL HISTORY:  Significant for coronary disease as documented above.  He has a history of hypertension and dyslipidemia.  He has a history of  hypothyroidism.  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  He  has had no prior surgeries.   FAMILY HISTORY:  Positive  for coronary artery disease.   SOCIAL HISTORY:  He is married and his wife is here today.  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  He quit smoking over 30  years ago.  He denies alcohol abuse.   PHYSICAL EXAMINATION:  VITAL SIGNS:  Blood pressure was 145/80.  His pulse  was 60 and regular.  Respiratory rate is 18 per minute.  GENERAL:  He is a  large-framed white male in no distress.  HEENT:  The head is normocephalic and atraumatic.  Pupils are equal, round  and reactive to light and accommodation.  Extraocular muscles are intact.  Throat is clear.  NECK:  Exam shows normal carotid pulses bilaterally.  There are no bruits.  There is no adenopathy or thyromegaly.  CARDIAC:  Exam shows regular rate and rhythm with normal S1 and S2.  There  is no murmur, rub or gallop. LUNGS:  Clear.  ABDOMEN:  Exam shows active bowel sounds.  His abdomen is soft and  moderately obese, and there appears there are no palpable masses or  organomegaly.  EXTREMITIES:   Extremity exam shows no peripheral edema.  Pedal pulses are  palpable bilaterally.  SKIN:  Warm and dry.  NEUROLOGIC:  Exam shows him to be alert and oriented x3.  Motor and sensory  are grossly normal.   LABORATORY AND ACCESSORY CLINICAL DATA:  Electrocardiogram shows sinus  bradycardia at rate of 53 beats per minute and EKG is otherwise normal.   Laboratory examination is pending.   IMPRESSION:  Mr. Wojcicki has severe three-vessel coronary artery disease  with unstable angina.  I agree that coronary artery bypass graft surgery is  the best treatment.  I discussed the operative procedure with the patient  and with the daughter including alternatives, benefits and risks including  bleeding, blood transfusion, infection, stroke, myocardial infarction, graft  failure, and death.  I told them that I would not be able to do surgery on  him, but Dr. [name] could do it Monday and the patient and his family  are in agreement with that.  He should be continued on heparin and  nitroglycerin and we will plan surgery on Monday.       BB/MEDQ  D:  05/22/2004  T:  05/23/2004  Job:  [id number]   cc:   [name], M.D.  [address]  [address]  [place]  [place] [address]  Fax: [phone]

## 2010-05-22 NOTE — H&P (Signed)
Gary [name], Gary [name] NO.:  [id number]   MEDICAL RECORD NO.:  [id number]          PATIENT TYPE:  INP   LOCATION:  1828                         FACILITY:  [facility]   PHYSICIAN:  Lyn Records, M.D.   DATE OF BIRTH:  [birth year]   DATE OF ADMISSION:  05/21/2004  DATE OF DISCHARGE:                                HISTORY & PHYSICAL   ADDENDUM:  I had previously dictated that Dr. Katrinka Blazing would be admitting Mr. [name] in  Dr. Arminda Resides absence but Dr. Arminda Resides will be available today and therefore  will be admitting his own patient.       ALE/MEDQ  D:  05/21/2004  T:  05/21/2004  Job:  [id number]

## 2010-05-22 NOTE — H&P (Signed)
NAME:  Gary [name], Gary [name]                       ACCOUNT NO.:  [id number]   MEDICAL RECORD NO.:  [id number]                   PATIENT TYPE:  INP   LOCATION:  2038                                 FACILITY:  [facility]   PHYSICIAN:  Lyn Records, M.D.                DATE OF BIRTH:  1945/04/14   DATE OF ADMISSION:  06/03/2002  DATE OF DISCHARGE:                                HISTORY & PHYSICAL   ADMISSION DIAGNOSES:  1. Atypical chest pain, rule out myocardial infarction.  2. Known coronary artery disease.  3. Hyperlipidemia.  4. History of tobacco abuse remotely.   CHIEF COMPLAINT:  Chest pain.   HISTORY OF PRESENT ILLNESS:  This is a 65 year old gentleman with a history  of coronary artery disease and drug-eluting stent placement x2 of the distal  RCA and LAD in June 2003.  Recently, he had left chest and arm pain.  He was  evaluated by Dr. Amil Amen in the office and the decision was made to do  elective cardiac catheterization on  June 08, 2002; however, the patient came to the emergency room this morning  due to recurrent left arm pain which seemed to be positional but there was  also an exertional component.  No other complaints.   ALLERGIES:  None.   MEDICATIONS:  1. Toprol.  2. Zocor.  3. Celexa.  4. Plavix.  5. Aspirin.  6. Synthroid.   PAST MEDICAL HISTORY:  1. Hyperlipidemia.  2. Hypertension.  3. Coronary artery disease, as mentioned above.  4. Hypothyroidism.  5. Osteoarthritis.   FAMILY HISTORY:  Significant for CAD, as the mom had CAD.  Father had an MI  and CVA.   SOCIAL HISTORY:  The patient is a prior smoker.  He works for [employer].  No current tobacco use or alcohol use.   REVIEW OF SYSTEMS:  See HPI.  He has had increased stress lately.   PHYSICAL EXAMINATION:  (Performed by Dr. Katrinka Blazing)  VITAL SIGNS:  Blood pressure 160/80, heart rate 52.  GENERAL:  The patient is alert and oriented x3.  HEENT:  Normocephalic, atraumatic.  LUNGS:   Clear to auscultation.  CARDIOVASCULAR:  Regular rate and rhythm without murmur or gallop.  ABDOMEN:  Soft, nontender.  No masses.  EXTREMITIES:  Without edema.  Distal pulses intact.  NEUROLOGIC:  Nonfocal.  Mentation intact.  SKIN:  Within normal limits.   LABORATORY DATA:  EKG shows normal sinus rhythm with no acute ST or T-wave  abnormality (actually sinus bradycardia).   Labs are pending.   PLAN:  The patient will be admitted for atypical chest pain with known  coronary artery disease, rule out myocardial infarction.  We will place the  patient on Lovenox, cycle cardiac enzymes, and plan cardiac catheterization  by Dr. Amil Amen on June 05, 2002.   Of note, the patient had a gated exercise Cardiolite study performed  December 14, 2001 which showed evidence of focal anterior wall infarction  with distal occlusion of diagonal branch and peri-infarction ischemia.  Ejection fraction 55%.     Georgiann Cocker Jernejcic, P.A.                   Lyn Records, M.D.    TCJ/MEDQ  D:  06/05/2002  T:  06/05/2002  Job:  [id number]   cc:   Titus Dubin. Alwyn Ren, M.D. [facility]

## 2010-05-22 NOTE — Op Note (Signed)
NAMELONALD, TROIANI             ACCOUNT NO.:  [id number]   MEDICAL RECORD NO.:  [id number]          PATIENT TYPE:  INP   LOCATION:  2311                         FACILITY:  [facility]   PHYSICIAN:  Salvatore Decent. [name], M.D.DATE OF BIRTH:  July 06, 1945   DATE OF PROCEDURE:  05/25/2004  DATE OF DISCHARGE:                                 OPERATIVE REPORT   PREOPERATIVE DIAGNOSIS:  Three-vessel coronary disease with unstable angina.   POSTOPERATIVE DIAGNOSIS:  Three-vessel coronary disease with unstable  angina.   PROCEDURE:  Median sternotomy, extracorporeal circulation, coronary artery  bypass grafting x3 (left internal mammary artery to left anterior  descending, saphenous vein graft to obtuse marginal 1, saphenous vein graft  to posterior descending), endoscopic vein harvest, right thigh.   SURGEON:  Salvatore Decent. [name], M.D.   ASSISTANT:  Pecola Leisure, P.A.   ANESTHESIA:  General.   FINDINGS:  Good quality conduits, good quality targets, left ventricular  hypertrophy.   CLINICAL NOTE:  Mr. Swoveland is a 65 year old gentleman with a history of  coronary disease with previous stents in his right coronary and LAD.  He now  presents with a two-week history of angina, progressive and unstable  symptoms, and a catheterization with severe in-stent restenosis in both the  right coronary and the LAD, also approximately a 50% proximal stenosis in  the large obtuse marginal branch.  The patient was referred for coronary  artery bypass grafting.  He was seen in consultation by Evelene Croon, M.D.  The indications, risks, benefits, and alternatives were discussed in detail  with the patient.  He was felt to be a good candidate to proceed.  I met  with the patient on the morning of surgery, reviewed the chart, cine's, and  met briefly with the patient.  He understood the risks of surgery and agreed  to proceed.   OPERATIVE NOTE:  Mr. Nez was brought to the preoperative holding  area  on May 25, 2004.  Sterile lines were placed to monitor arterial, central  venous, and pulmonary arterial pressure.  EKG leads were placed for  continuous telemetry.  Intravenous antibiotics were administered.  The  patient was taken to the operating room, anesthetized, and intubated.  A  Foley catheter was placed.  The chest, abdomen, and legs were prepped and  draped in the usual fashion.   A median sternotomy was performed, and the left internal mammary artery was  harvested using standard technique.  The mammary was a good quality vessel.  Simultaneously, an incision was made in the medial aspect of the right leg,  and the greater saphenous vein was harvested from the right thigh  endoscopically.  The vein was also a good quality vessel.   After harvesting of the conduits, the patient was heparinized.  The  pericardium was opened.  The ascending aorta was inspected and palpated.  There was no palpable atherosclerotic disease.  The aorta was cannulated via  pledgeted pursestring sutures.  A dual-stage venous cannula was placed via a  pursestring suture in the right septal appendage.  Cardiopulmonary bypass  was instituted, and the patient was cooled  to 32 degrees celsius.  The  coronary arteries were inspected, and anastomotic sites were chosen.  The  conduits were inspected and cut to length.  A foam pad was placed in the  pericardium to protect the left root and nerve, and the temperature probe  was placed in the myocardial septum.  A cardioplegia cannula was placed in  the ascending aorta.   The aorta was cross-clamped.  The left ventricle was emptied via the aortic  root, and cardiac arrest then achieved with antegrade blood cardioplegia and  topical iced saline.  One liter was cardioplegia was administered.  The  myocardial septal temperature was 11 degrees celsius.   Next, a reverse saphenous vein graft was placed end-to-side to the posterior  descending branch of the  right coronary.  This was a 2-mm good quality  target.  The vein was of good quality.  The anastomosis was performed with a  running 7-0 Prolene suture.  The anastomosis probed easily proximally and  distally.  At its completion, cardioplegia was administered.  There was good  flow.  A small leak was repaired with a 7-0 Prolene suture.   Next, a reverse saphenous vein graft was placed end-to-side to the first  obtuse marginal branch of the left circumflex coronary artery.  This was the  only graftable branch off the circumflex.  It had a 50% proximal stenosis.  It was a 2-mm good quality target at the site of the anastomosis.  The vein  graft was anastomosed end-to-side with a running 7-0 Prolene suture.  The  anastomosis probed easily proximally and distally.  Cardioplegia was  administered.  There was good hemostasis and good flow.   Additional cardioplegia was administered down the aortic root.  The left  internal mammary artery then was brought through a window in the  pericardium.  The distal end was spatulated.  It was anastomosed end-to-side  to the LAD just beyond the last stent.  The mid-LAD was a 1.5-mm good  quality target.  The mammary was a 2-mm good quality conduit.  The  anastomosis was performed with a running 8-0 Prolene suture.  At the  completion of the mammary to LAD anastomosis, the Bulldog clamp was briefly  removed and inspected for hemostasis.  Immediate rapid septal rewarming was  noted.  Lidocaine was administered.  Additional cardioplegia was  administered.  The mammary pedicle was tacked to the epicardial surface of  the heart with 6-0 Prolene sutures.   After once again achieving adequate septal cooling, the vein grafts were cut  to length and the proximal vein graft anastomoses were performed to 4.5-mm  punch aortotomies with running 6-0 Prolene sutures.  At the completion of  the final proximal anastomosis, the patient was placed in steep Trendelenburg  position.  Lidocaine was administered.  The aortic root was de-  aired.  The Bulldog clamp was again removed from the left mammary artery.  Immediate rapid septal rewarming was once again noted.  After de-airing the  aortic root, the aortic cross-clamp was removed.  Total cross-clamp time was  58 minutes.   The patient initially fibrillated but returned to sinus rhythm with a single  defibrillation with 20 joules.  He then remained in sinus rhythm thereafter.  While the patient was being rewarmed, all proximal and distal anastomoses  were inspected for hemostasis.  Epicardial pacing wires were placed on the  right ventricle and right atrium.  When the patient had rewarmed to a core  temperature  of 37 degrees celsius, he was weaned from cardiopulmonary bypass  without difficulty.  He was paced but on no inotropic support at the time of  separation from bypass.  Total bypass time was 92 minutes.  The initial  cardiac index was greater than 2 LPM per meter squared, and the patient  remained hemodynamically stable throughout the post-bypass period.   Post-bypass supportive Protamine was administered and was well tolerated.  The atrium and aortic cannulae were removed.  The remainder of the Protamine  was administered without incident.  The chest was irrigated with one liter  of warm normal saline containing 1 g of vancomycin.  Hemostasis was  achieved.  A left pleural and two mediastinal chest tubes were placed  through separate subcostal incisions.  The pericardium was partially  reapproximated with interrupted 3-0 silk sutures.  The sternum then was  closed with a combination of single and double heavy gauge stainless steel  wires.  The remainder of the incision was closed in standard fashion.  A  subcuticular closure was used for the skin.  The patient had a transient  drop in his cardiac index without other hemodynamic changes.  With this  closure of the chest, this responded  appropriately to volume  administrations.  All sponge, needle, and instrument counts were correct at  the end of the procedure.  The patient was taken from the operating room to  the surgical intensive care unit in critical but stable condition.      SCH/MEDQ  D:  05/25/2004  T:  05/25/2004  Job:  [id number]   cc:   [name], M.D.  [address]  [place]  [place] [address]  Fax: [phone]   Magdalene Patricia, M.D.

## 2010-05-22 NOTE — Cardiovascular Report (Signed)
[place]  Patient:    Gary [name], Gary [name] Visit Number: [id number] MRN: [id number]          Service Type: CAT Location: [id number] Attending Physician:  Corliss Marcus Dictated by:   [name], M.D. Proc. Date: 06/23/01 Admit Date:  06/23/2001 Discharge Date: 06/24/2001   CC:         Titus Dubin. Alwyn Ren, M.D. [facility]  Cardiopulmonary Laboratory   Cardiac Catheterization  PROCEDURE PERFORMED: Percutaneous coronary intervention/drug-eluting stent implantation, mid left anterior descending artery.  INDICATIONS: The patient is a 65 year old man who returns now to the cardiac catheterization laboratory to complete revascularization after presenting with angina pectoris and undergoing stent implantation in the right coronary approximately one month ago.  DESCRIPTION OF PROCEDURE: The patient was brought to the cardiac catheterization laboratory in a postabsorptive state. The right groin was prepped and draped in the usual sterile fashion. Local anesthesia was obtained with the infiltration of 1% lidocaine. A 7 French catheter sheath was inserted percutaneously into the right femoral artery utilizing an anterior approach over a guiding J wire. A 7 [place], #4, CLS, Scimed Mach I guiding catheter was advanced to the ascending aorta where the left coronary os was engaged. The patient received 79.5 mg of Angiomax intravenously and was placed on a constant infusion at 1.75 mg/kg per hour during the remainder of the procedure. Initial ACT was 378 seconds.  A 0.014 inch Scimed luge intracoronary guide wire was passed across the lesion without difficulty in the midportion of the anterior descending artery. Initial balloon dilatation was performed with a 3.0/30 mm ACS Guidant CrossSail intracoronary balloon catheter. This was inflated to 6 atmospheres for approximately 1 minute. The balloon was deflated and removed. The patient had already begun to  have angina after wire passage. It intensified at this point. Repeat cineangiography revealed a near complete occlusion distal to the balloon dilated portion of the artery. The CrossSail balloon was advanced again into this near complete occlusion that persisted despite two intracoronary nitroglycerin doses of 0.2 mg each. The balloon again was inflated for about 1 minute at 4 atmospheres. After this balloon inflation, the near complete occlusion persisted and it was apparent there was a small proximal dissection. The whole dissection and near complete occlusion itself was distal, however, to the treated segment that was initially dilated. Therefore, the 3.0/33 mm Cordis Cypher drug-eluting stent was advanced into place more distally and across this dissected portion. It was inflated and deployed there for 1 minute at a peak pressure of 11 atmospheres. This stent balloon was removed. It maneuver did result in restoration of antegrade flow in the anterior descending artery. However, a moderate sized diagonal branch was placed in "stent jail." A second Cordis Cypher stent this time, 3.0/18 mm was advanced in the more proximal segment of the lesion treated. It was deployed there at a peak pressure of 20 atmospheres for approximately 1 minute. This balloon was deflated and advanced into the more distal portion of the treated segment still covered by the drug-eluting stent. It was inflated there to 16 atmospheres for about 30 seconds. Finally, this balloon was removed. Excellent antegrade flow had been established in the anterior descending artery. The diagonal branch itself was still highly stenotic and probably experiencing spasm. An initial dose of intracoronary nitroglycerin was given without significant change. An attempt was made to pass the wire into the diagonal. This was partially successful but I could not advance the wire into the larger  section of the artery. Therefore, further  attempts were discontinued and the wire and guide catheter were removed. Hemostasis was achieved by suturing the sheath in place. Orthogonal views did prove the anterior descending artery to be widely patent after the guide wire was removed. The patient was then removed from the catheterization table and transported to the recovery area in stable condition. He was still experiencing some angina at the time he was removed from the table, but it had significantly decreased.  ANGIOGRAPHY: As mentioned, the lesion treated was in the midportion of the anterior descending and there was a long lesion measuring about 27 mm in length. Initial plans to treat with a 33 mm 3.0 Cypher stent alone had to be discarded after the artery developed a distal dissection. However, following the tandem stent implantation, there was wide patency without residual obstruction and good TIMI grade 3 distal flow.  FINAL IMPRESSION: 1. Atherosclerotic coronary vascular disease, two-vessel. 2. Status post successful percutaneous transluminal coronary angioplasty and    drug-eluting stent implantation in the mid left anterior descending artery. 3. Typical and rather severe angina was reproduced device insertion and    balloon inflation. Dictated by:   [name], M.D. Attending Physician:  Corliss Marcus DD:  06/23/01 TD:  06/25/01 Job: [id number]

## 2010-05-22 NOTE — Discharge Summary (Signed)
NAME:  YERIK, ZERINGUE                       ACCOUNT NO.:  [id number]   MEDICAL RECORD NO.:  [id number]                   PATIENT TYPE:  INP   LOCATION:  6524                                 FACILITY:  [facility]   PHYSICIAN:  [name], M.D.               DATE OF BIRTH:  01/20/1945   DATE OF ADMISSION:  06/03/2002  DATE OF DISCHARGE:  06/06/2002                                 DISCHARGE SUMMARY   ADMISSION DIAGNOSES:  1. Atypical chest pain.  2. Known coronary artery disease.  3. Hyperlipidemia.  4. Obesity.  5. History of tobacco use.   DISCHARGE DIAGNOSES:  1. Atypical chest pain, resolved.  Myocardial infarction ruled out.  2. Status post cardiac catheterization revealing high-grade, 95% end-stent     restenosis in the mid left anterior descending, status post successful     cutting-balloon intervention by Dr. Amil Amen.  3. Known coronary artery disease.  4. Hyperlipidemia.  5. Obesity.  6. History of tobacco use.   HISTORY OF PRESENT ILLNESS:  Mr. Lazcano is a very pleasant 65 year old  gentleman who has known coronary artery disease, status post drug-eluding  stent placement times two to the distal RCA and a drug-eluding stent to the  LAD in June of '03.  Recently, he began complaining of left chest and arm  pain.  He was evaluated by Dr. Amil Amen in the office, and plans were made  for an elective cardiac catheterization on June 08, 2002; however, the  patient presented to [facility] emergency room on Jun 03, 2002, with  complaints of recurrent left arm pain.  Based on his symptoms and plans for  diagnostic cath in the near future, Dr. Katrinka Blazing admitted the patient to rule  out MI and scheduled cardiac catheterization for June 05, 2002.   PROCEDURES:  Cardiac catheterization, June 05, 2002, by Dr. Amil Amen.   COMPLICATIONS:  None.   CONSULTATIONS:  None.   HOSPITAL COURSE:  Mr. Macha was admitted to [facility] on Jun 03, 2002, because of recurrent chest  pain.  EKG was nonacute.  Lovenox was  dosed, per pharmacy, and home medications were continued.  (Patient was  chest pain-free, and therefore nitroglycerin was not started; it was ordered  on a p.r.n. basis.)   Laboratory studies showed cardiac enzymes to be negative times three.  WBC  8.0, hemoglobin 14.0, and platelets 326.  Potassium 4.1, BUN 16, creatinine  1.1.   Total cholesterol 150, triglycerides 107, HDL 41, and LDL 88.   Coagulation studies were within normal limits, and the patient was taken to  the cardiac cath lab by Dr. Amil Amen on June 05, 2002, electively; this  revealed a normal LV with an EF of 60%.  The mid LAD had a 95% end-stent  restenosis.  The circumflex had no significant obstructive lesions, and the  RCA had a 30 to 40% lesion.  Dr. Amil Amen proceeded with cutting-balloon  intervention to the mid LAD, reducing the 95% lesion to less than 10%.  Patient tolerated the procedure well.  He did have some atypical angina  during the procedure, but this resolved post balloon inflation.  Distal  pulses remained intact.   Post procedure, patient remained stable.  Patient tolerated the sheath pull  and had no development of hematoma.   On the morning of June 06, 2002, the patient had slight oozing from the right  groin, and pressure was reapplied.  Hemodynamics remained stable, hemoglobin  was 12.6, and platelets 318.   Once the Integrilin infusion has been completed, the patient will ambulate;  if the groin remains stable, and hemodynamics are stable, the patient will  be discharged home today.   DISCHARGE MEDICATIONS:  1. Plavix 75 mg per day.  2. Aspirin 325 mg per day.  3. Celexa 20 mg per day.  4. Zocor 40 mg per day.  5. Toprol XL 50 mg per day.  6. Synthroid 0.15 mg per day.  7. Nitroglycerin as needed for chest pain.   ACTIVITY:  1. No strenuous activity, lifting over 5 pounds, or driving for two days.  2. He may shower.   DIET:  Low-fat,  low-cholesterol, low-salt diet.   DISCHARGE INSTRUCTIONS:  Advised to call the office if new problems or  questions.   FOLLOW UP:  Follow up with Dr. Amil Amen on Friday, June 22, 2002, at 2  o'clock.     Georgiann Cocker Jernejcic, P.A.                   [name], M.D.    TCJ/MEDQ  D:  06/06/2002  T:  06/06/2002  Job:  [id number]   cc:   [name], M.D.  [address]  [address]  [place]  [place] [address]  Fax: [phone]   Titus Dubin. Alwyn Ren, M.D. [facility]

## 2010-05-27 ENCOUNTER — Other Ambulatory Visit: Payer: Self-pay | Admitting: Internal Medicine

## 2010-05-27 DIAGNOSIS — R7989 Other specified abnormal findings of blood chemistry: Secondary | ICD-10-CM

## 2010-06-03 ENCOUNTER — Ambulatory Visit
Admission: RE | Admit: 2010-06-03 | Discharge: 2010-06-03 | Disposition: A | Discharge status: Home / Self Care | Payer: Medicare Other | Source: Ambulatory Visit | Attending: Internal Medicine | Admitting: Internal Medicine

## 2010-06-03 DIAGNOSIS — R7989 Other specified abnormal findings of blood chemistry: Secondary | ICD-10-CM

## 2010-06-03 MED ORDER — GADOBENATE DIMEGLUMINE 529 MG/ML IV SOLN: 10.0000 mL | Freq: Once | INTRAVENOUS | Status: AC | PRN

## 2010-09-01 ENCOUNTER — Other Ambulatory Visit: Payer: Self-pay | Admitting: Dermatology

## 2011-01-27 DIAGNOSIS — E291 Testicular hypofunction: Secondary | ICD-10-CM | POA: Diagnosis not present

## 2011-01-27 DIAGNOSIS — L989 Disorder of the skin and subcutaneous tissue, unspecified: Secondary | ICD-10-CM | POA: Diagnosis not present

## 2011-01-27 DIAGNOSIS — G4733 Obstructive sleep apnea (adult) (pediatric): Secondary | ICD-10-CM | POA: Diagnosis not present

## 2011-01-27 DIAGNOSIS — E039 Hypothyroidism, unspecified: Secondary | ICD-10-CM | POA: Diagnosis not present

## 2011-01-27 DIAGNOSIS — R7301 Impaired fasting glucose: Secondary | ICD-10-CM | POA: Diagnosis not present

## 2011-02-12 DIAGNOSIS — I10 Essential (primary) hypertension: Secondary | ICD-10-CM | POA: Diagnosis not present

## 2011-02-12 DIAGNOSIS — E785 Hyperlipidemia, unspecified: Secondary | ICD-10-CM | POA: Diagnosis not present

## 2011-02-12 DIAGNOSIS — I2510 Atherosclerotic heart disease of native coronary artery without angina pectoris: Secondary | ICD-10-CM | POA: Diagnosis not present

## 2011-02-12 DIAGNOSIS — E669 Obesity, unspecified: Secondary | ICD-10-CM | POA: Diagnosis not present

## 2011-02-12 DIAGNOSIS — Z79899 Other long term (current) drug therapy: Secondary | ICD-10-CM | POA: Diagnosis not present

## 2011-02-12 DIAGNOSIS — E782 Mixed hyperlipidemia: Secondary | ICD-10-CM | POA: Diagnosis not present

## 2011-02-16 ENCOUNTER — Other Ambulatory Visit: Payer: Self-pay | Admitting: Dermatology

## 2011-02-16 DIAGNOSIS — Z85828 Personal history of other malignant neoplasm of skin: Secondary | ICD-10-CM | POA: Diagnosis not present

## 2011-02-16 DIAGNOSIS — C44519 Basal cell carcinoma of skin of other part of trunk: Secondary | ICD-10-CM | POA: Diagnosis not present

## 2011-02-16 DIAGNOSIS — D239 Other benign neoplasm of skin, unspecified: Secondary | ICD-10-CM | POA: Diagnosis not present

## 2011-03-04 DIAGNOSIS — C44519 Basal cell carcinoma of skin of other part of trunk: Secondary | ICD-10-CM | POA: Diagnosis not present

## 2011-04-21 DIAGNOSIS — F325 Major depressive disorder, single episode, in full remission: Secondary | ICD-10-CM | POA: Diagnosis not present

## 2011-04-21 DIAGNOSIS — R209 Unspecified disturbances of skin sensation: Secondary | ICD-10-CM | POA: Diagnosis not present

## 2011-04-21 DIAGNOSIS — G479 Sleep disorder, unspecified: Secondary | ICD-10-CM | POA: Diagnosis not present

## 2011-04-21 DIAGNOSIS — I209 Angina pectoris, unspecified: Secondary | ICD-10-CM | POA: Diagnosis not present

## 2011-04-21 DIAGNOSIS — E039 Hypothyroidism, unspecified: Secondary | ICD-10-CM | POA: Diagnosis not present

## 2011-04-21 DIAGNOSIS — G4733 Obstructive sleep apnea (adult) (pediatric): Secondary | ICD-10-CM | POA: Diagnosis not present

## 2011-04-21 DIAGNOSIS — I10 Essential (primary) hypertension: Secondary | ICD-10-CM | POA: Diagnosis not present

## 2011-04-21 DIAGNOSIS — R7301 Impaired fasting glucose: Secondary | ICD-10-CM | POA: Diagnosis not present

## 2011-06-24 DIAGNOSIS — IMO0002 Reserved for concepts with insufficient information to code with codable children: Secondary | ICD-10-CM | POA: Diagnosis not present

## 2011-07-26 DIAGNOSIS — M999 Biomechanical lesion, unspecified: Secondary | ICD-10-CM | POA: Diagnosis not present

## 2011-07-26 DIAGNOSIS — M5137 Other intervertebral disc degeneration, lumbosacral region: Secondary | ICD-10-CM | POA: Diagnosis not present

## 2011-07-29 DIAGNOSIS — M5137 Other intervertebral disc degeneration, lumbosacral region: Secondary | ICD-10-CM | POA: Diagnosis not present

## 2011-07-29 DIAGNOSIS — M999 Biomechanical lesion, unspecified: Secondary | ICD-10-CM | POA: Diagnosis not present

## 2011-07-30 DIAGNOSIS — M5137 Other intervertebral disc degeneration, lumbosacral region: Secondary | ICD-10-CM | POA: Diagnosis not present

## 2011-07-30 DIAGNOSIS — M999 Biomechanical lesion, unspecified: Secondary | ICD-10-CM | POA: Diagnosis not present

## 2011-08-14 DIAGNOSIS — M169 Osteoarthritis of hip, unspecified: Secondary | ICD-10-CM | POA: Diagnosis not present

## 2011-08-14 DIAGNOSIS — M1610 Unilateral primary osteoarthritis, unspecified hip: Secondary | ICD-10-CM | POA: Diagnosis not present

## 2011-08-17 DIAGNOSIS — M169 Osteoarthritis of hip, unspecified: Secondary | ICD-10-CM | POA: Diagnosis not present

## 2011-08-17 DIAGNOSIS — M1610 Unilateral primary osteoarthritis, unspecified hip: Secondary | ICD-10-CM | POA: Diagnosis not present

## 2011-08-25 DIAGNOSIS — I10 Essential (primary) hypertension: Secondary | ICD-10-CM | POA: Diagnosis not present

## 2011-08-25 DIAGNOSIS — M79609 Pain in unspecified limb: Secondary | ICD-10-CM | POA: Diagnosis not present

## 2011-08-27 DIAGNOSIS — M1610 Unilateral primary osteoarthritis, unspecified hip: Secondary | ICD-10-CM | POA: Diagnosis not present

## 2011-08-27 DIAGNOSIS — M169 Osteoarthritis of hip, unspecified: Secondary | ICD-10-CM | POA: Diagnosis not present

## 2011-09-03 DIAGNOSIS — E7800 Pure hypercholesterolemia, unspecified: Secondary | ICD-10-CM | POA: Diagnosis not present

## 2011-09-29 DIAGNOSIS — Z23 Encounter for immunization: Secondary | ICD-10-CM | POA: Diagnosis not present

## 2011-10-11 DIAGNOSIS — M5137 Other intervertebral disc degeneration, lumbosacral region: Secondary | ICD-10-CM | POA: Diagnosis not present

## 2011-10-12 DIAGNOSIS — M5137 Other intervertebral disc degeneration, lumbosacral region: Secondary | ICD-10-CM | POA: Diagnosis not present

## 2011-10-18 DIAGNOSIS — M5137 Other intervertebral disc degeneration, lumbosacral region: Secondary | ICD-10-CM | POA: Diagnosis not present

## 2011-10-18 DIAGNOSIS — M1610 Unilateral primary osteoarthritis, unspecified hip: Secondary | ICD-10-CM | POA: Diagnosis not present

## 2011-10-18 DIAGNOSIS — M169 Osteoarthritis of hip, unspecified: Secondary | ICD-10-CM | POA: Diagnosis not present

## 2011-10-28 ENCOUNTER — Other Ambulatory Visit: Payer: Self-pay | Admitting: Dermatology

## 2011-10-28 DIAGNOSIS — D1801 Hemangioma of skin and subcutaneous tissue: Secondary | ICD-10-CM | POA: Diagnosis not present

## 2011-10-28 DIAGNOSIS — L821 Other seborrheic keratosis: Secondary | ICD-10-CM | POA: Diagnosis not present

## 2011-10-28 DIAGNOSIS — D485 Neoplasm of uncertain behavior of skin: Secondary | ICD-10-CM | POA: Diagnosis not present

## 2011-10-28 DIAGNOSIS — D239 Other benign neoplasm of skin, unspecified: Secondary | ICD-10-CM | POA: Diagnosis not present

## 2011-10-28 DIAGNOSIS — C44611 Basal cell carcinoma of skin of unspecified upper limb, including shoulder: Secondary | ICD-10-CM | POA: Diagnosis not present

## 2011-10-28 DIAGNOSIS — D0460 Carcinoma in situ of skin of unspecified upper limb, including shoulder: Secondary | ICD-10-CM | POA: Diagnosis not present

## 2011-10-29 DIAGNOSIS — Z01810 Encounter for preprocedural cardiovascular examination: Secondary | ICD-10-CM | POA: Diagnosis not present

## 2011-10-29 DIAGNOSIS — E7800 Pure hypercholesterolemia, unspecified: Secondary | ICD-10-CM | POA: Diagnosis not present

## 2011-10-29 DIAGNOSIS — I10 Essential (primary) hypertension: Secondary | ICD-10-CM | POA: Diagnosis not present

## 2011-10-29 DIAGNOSIS — I2510 Atherosclerotic heart disease of native coronary artery without angina pectoris: Secondary | ICD-10-CM | POA: Diagnosis not present

## 2011-11-02 DIAGNOSIS — I2510 Atherosclerotic heart disease of native coronary artery without angina pectoris: Secondary | ICD-10-CM | POA: Diagnosis not present

## 2011-11-02 DIAGNOSIS — Z01810 Encounter for preprocedural cardiovascular examination: Secondary | ICD-10-CM | POA: Diagnosis not present

## 2011-11-02 DIAGNOSIS — E7800 Pure hypercholesterolemia, unspecified: Secondary | ICD-10-CM | POA: Diagnosis not present

## 2011-11-02 DIAGNOSIS — I10 Essential (primary) hypertension: Secondary | ICD-10-CM | POA: Diagnosis not present

## 2011-11-15 DIAGNOSIS — E291 Testicular hypofunction: Secondary | ICD-10-CM | POA: Diagnosis not present

## 2011-11-15 DIAGNOSIS — I10 Essential (primary) hypertension: Secondary | ICD-10-CM | POA: Diagnosis not present

## 2011-11-15 DIAGNOSIS — Z0000 Encounter for general adult medical examination without abnormal findings: Secondary | ICD-10-CM | POA: Diagnosis not present

## 2011-11-15 DIAGNOSIS — E039 Hypothyroidism, unspecified: Secondary | ICD-10-CM | POA: Diagnosis not present

## 2011-11-18 DIAGNOSIS — E039 Hypothyroidism, unspecified: Secondary | ICD-10-CM | POA: Diagnosis not present

## 2011-11-18 DIAGNOSIS — I10 Essential (primary) hypertension: Secondary | ICD-10-CM | POA: Diagnosis not present

## 2011-11-18 DIAGNOSIS — E291 Testicular hypofunction: Secondary | ICD-10-CM | POA: Diagnosis not present

## 2011-12-23 ENCOUNTER — Encounter (HOSPITAL_COMMUNITY): Payer: Self-pay

## 2011-12-23 NOTE — Patient Instructions (Addendum)
20 FIDENCIO DUDDY  12/24/2011   Your procedure is scheduled on:  01/03/12   monday  Report to [facility] at 0940      AM.  Call this number if you have problems the morning of surgery: [phone]       Remember: Bring cpap mask mask with tubing with you  Do not eat food  Or drink :After Midnight.   Sunday NIGHT   Take these medicines the morning of surgery with A SIP OF WATER: Carvedilol, Synthyroid   .  Contacts, dentures or partial plates can not be worn to surgery  Leave suitcase in the car. After surgery it may be brought to your room.  For patients admitted to the hospital, checkout time is 11:00 AM day of  discharge.             SPECIAL INSTRUCTIONS- SEE [place] PREPARING FOR SURGERY INSTRUCTION SHEET-     DO NOT WEAR JEWELRY, LOTIONS, POWDERS, OR PERFUMES.  WOMEN-- DO NOT SHAVE LEGS OR UNDERARMS FOR 12 HOURS BEFORE SHOWERS. MEN MAY SHAVE FACE.  Patients discharged the day of surgery will not be allowed to drive home. IF going home the day of surgery, you must have a driver and someone to stay with you for the first 24 hours  Name and phone number of your driver:   wife                                                                     Please read over the following fact sheets that you were given: MRSA Information, Incentive Spirometry Sheet, Blood Transfusion Sheet  Information                                                                                   Johnnay Pleitez  PST 336  [phone]                 FAILURE TO FOLLOW THESE INSTRUCTIONS MAY RESULT IN  CANCELLATION   OF YOUR SURGERY                                                  Patient Signature _____________________________

## 2011-12-24 ENCOUNTER — Encounter (HOSPITAL_COMMUNITY)
Admission: RE | Admit: 2011-12-24 | Discharge: 2011-12-24 | Disposition: A | Discharge status: Home / Self Care | Payer: Medicare Other | Source: Ambulatory Visit | Attending: Orthopedic Surgery | Admitting: Orthopedic Surgery

## 2011-12-24 ENCOUNTER — Ambulatory Visit (HOSPITAL_COMMUNITY)
Admission: RE | Admit: 2011-12-24 | Discharge: 2011-12-24 | Disposition: A | Discharge status: Home / Self Care | Payer: Medicare Other | Source: Ambulatory Visit | Attending: Surgical | Admitting: Surgical

## 2011-12-24 ENCOUNTER — Encounter (HOSPITAL_COMMUNITY): Payer: Self-pay

## 2011-12-24 DIAGNOSIS — Z951 Presence of aortocoronary bypass graft: Secondary | ICD-10-CM | POA: Diagnosis not present

## 2011-12-24 DIAGNOSIS — I517 Cardiomegaly: Secondary | ICD-10-CM | POA: Diagnosis not present

## 2011-12-24 DIAGNOSIS — Z01812 Encounter for preprocedural laboratory examination: Secondary | ICD-10-CM | POA: Insufficient documentation

## 2011-12-24 DIAGNOSIS — J42 Unspecified chronic bronchitis: Secondary | ICD-10-CM | POA: Diagnosis not present

## 2011-12-24 DIAGNOSIS — I10 Essential (primary) hypertension: Secondary | ICD-10-CM | POA: Insufficient documentation

## 2011-12-24 DIAGNOSIS — Z01818 Encounter for other preprocedural examination: Secondary | ICD-10-CM | POA: Insufficient documentation

## 2011-12-24 HISTORY — DX: Essential (primary) hypertension: I10

## 2011-12-24 HISTORY — DX: Depression, unspecified: F32.A

## 2011-12-24 HISTORY — DX: Sleep apnea, unspecified: G47.30

## 2011-12-24 HISTORY — DX: Major depressive disorder, single episode, unspecified: F32.9

## 2011-12-24 HISTORY — DX: Unspecified osteoarthritis, unspecified site: M19.90

## 2011-12-24 HISTORY — DX: Malignant (primary) neoplasm, unspecified: C80.1

## 2011-12-24 HISTORY — DX: Hyperlipidemia, unspecified: E78.5

## 2011-12-24 HISTORY — DX: Atherosclerotic heart disease of native coronary artery without angina pectoris: I25.10

## 2011-12-24 LAB — COMPREHENSIVE METABOLIC PANEL
ALT: 24 U/L (ref 0–53)
AST: 17 U/L (ref 0–37)
Albumin: 3.9 g/dL (ref 3.5–5.2)
Alkaline Phosphatase: 86 U/L (ref 39–117)
BUN: 13 mg/dL (ref 6–23)
CO2: 30 mEq/L (ref 19–32)
Calcium: 9.1 mg/dL (ref 8.4–10.5)
Chloride: 99 mEq/L (ref 96–112)
Creatinine, Ser: 0.69 mg/dL (ref 0.50–1.35)
GFR calc Af Amer: >90 mL/min (ref >90)
GFR calc non Af Amer: >90 mL/min (ref >90)
Glucose, Bld: 116 mg/dL — ABNORMAL HIGH (ref 70–99)
Potassium: 4.4 mEq/L (ref 3.5–5.1)
Sodium: 136 mEq/L (ref 135–145)
Total Bilirubin: 0.3 mg/dL (ref 0.3–1.2)
Total Protein: 7.3 g/dL (ref 6.0–8.3)

## 2011-12-24 LAB — URINALYSIS, ROUTINE W REFLEX MICROSCOPIC
Bilirubin Urine: NEGATIVE
Glucose, UA: NEGATIVE mg/dL
Hgb urine dipstick: NEGATIVE
Ketones, ur: NEGATIVE mg/dL
Leukocytes, UA: NEGATIVE
Nitrite: NEGATIVE
Protein, ur: NEGATIVE mg/dL
Specific Gravity, Urine: 1.022 (ref 1.005–1.030)
Urobilinogen, UA: 0.2 mg/dL (ref 0.0–1.0)
pH: 7 (ref 5.0–8.0)

## 2011-12-24 LAB — PROTIME-INR
INR: 0.91 (ref 0.00–1.49)
Prothrombin Time: 12.2 seconds (ref 11.6–15.2)

## 2011-12-24 LAB — APTT: aPTT: 34 seconds (ref 24–37)

## 2011-12-24 LAB — CBC
Platelets: 336 10*3/uL (ref 150–400)
RDW: 14.7 % (ref 11.5–15.5)
WBC: 7.7 10*3/uL (ref 4.0–10.5)

## 2011-12-24 LAB — SURGICAL PCR SCREEN
MRSA, PCR: NEGATIVE
Staphylococcus aureus: POSITIVE — AB

## 2011-12-24 NOTE — Progress Notes (Signed)
States will be stopping supplements, vitamins and aspirin with last dose 12/30/11

## 2011-12-24 NOTE — Progress Notes (Signed)
Clearance Dr Anne Fu with note and LOV 10/13, EKG 10/13 chart,, stress test report 10/13 chart,  CPAP/BIPAP interpretion report on chart with note setting are at 8.   Last cath 2006

## 2011-12-27 NOTE — Progress Notes (Signed)
Notified patients wife of surgery change time and to be at short stay at 1015 am with nothing to eat or drink after midnight morning of surgery

## 2012-01-02 NOTE — H&P (Signed)
TOTAL HIP ADMISSION H&P  Patient is admitted for right total hip arthroplasty.  Subjective:  Chief Complaint: right hip pain  HPI: Gary [name], 66 y.o. male, has a history of pain and functional disability in the right hip(s) due to arthritis and patient has failed non-surgical conservative treatments for greater than 12 weeks to include NSAID's and/or analgesics, use of assistive devices and activity modification.  Onset of symptoms was gradual starting 3 years ago with gradually worsening course since that time.The patient noted no past surgery on the right hip(s).  Patient currently rates pain in the right hip at 8 out of 10 with activity. Patient has night pain, worsening of pain with activity and weight bearing, pain that interfers with activities of daily living and pain with passive range of motion. Patient has evidence of periarticular osteophytes, joint subluxation and joint space narrowing by imaging studies. This condition presents safety issues increasing the risk of falls. There is no current active infection.  Past Medical History  Diagnosis Date  . Hypertension   . Coronary artery disease   . Depression   . Hyperlipidemia   . Arthritis   . Sleep apnea     CPAP 'Uses half the time"  last study 2009  . Cancer     skin    Past Surgical History  Procedure Date  . Coronary artery bypass graft 2006  . Coronary angioplasty     multiple stents  . Carpal tunnel release   . Eye surgery     right eye-  muscular repair     Current outpatient prescriptions: acetaminophen (TYLENOL) 500 MG tablet, Take 1,000 mg by mouth every 6 (six) hours as needed. Pain, Disp: , Rfl: ;   APPLE CIDER VINEGAR PO, Take 1 tablet by mouth 2 (two) times daily., Disp: , Rfl: ;   aspirin EC 325 MG tablet, Take 325 mg by mouth every evening., Disp: , Rfl: ;   carvedilol (COREG) 12.5 MG tablet, Take 12.5 mg by mouth 2 (two) times daily with a meal., Disp: , Rfl:  cholecalciferol (VITAMIN D) 1000  UNITS tablet, Take 4,000 Units by mouth every morning., Disp: , Rfl: ;  CINNAMON PO, Take 1,000 mg by mouth 2 (two) times daily., Disp: , Rfl: ;   Coenzyme Q10 (CO Q 10) 100 MG CAPS, Take 200 mg by mouth 2 (two) times daily., Disp: , Rfl: ;  cyanocobalamin ([employer] VITAMIN B12) 2000 MCG tablet, Take 2,000 mcg by mouth daily., Disp: , Rfl: ;  ezetimibe (ZETIA) 10 MG tablet, Take 10 mg by mouth every evening., Disp: , Rfl:  Ginger, Zingiber officinalis, (GINGER ROOT) 550 MG CAPS, Take 550 mg by mouth 2 (two) times daily., Disp: , Rfl: ;   Glucos-Chondroit-Hyaluron-MSM (GLUCOSAMINE CHONDROITIN JOINT PO), Take 1 tablet by mouth 2 (two) times daily., Disp: , Rfl: ;   indomethacin (INDOCIN) 25 MG capsule, Take 25 mg by mouth 2 (two) times daily with a meal., Disp: , Rfl:  levothyroxine (SYNTHROID, LEVOTHROID) 137 MCG tablet, Take 137-205.5 mcg by mouth See admin instructions. Pt takes an additional  1/2 tablet on Monday Wednesday  Friday, Disp: , Rfl: ;  Multiple Vitamin (MULTIVITAMIN WITH MINERALS) TABS, Take 1 tablet by mouth every morning., Disp: , Rfl: ;   Omega-3 Fatty Acids (FISH OIL) 1000 MG CAPS, Take 2,000 mg by mouth 2 (two) times daily., Disp: , Rfl:  OVER THE COUNTER MEDICATION, Take 1,800 mg by mouth 3 (three) times daily. cholestoff 1800 mg natural plant,  Disp: , Rfl: ;   oxymetazoline (AFRIN) 0.05 % nasal spray, Place 2 sprays into the nose 2 (two) times daily as needed. Congestion, Disp: , Rfl: ;   pravastatin (PRAVACHOL) 80 MG tablet, Take 80 mg by mouth every evening., Disp: , Rfl:  testosterone cypionate (DEPOTESTOTERONE CYPIONATE) 100 MG/ML injection, Inject 200 mg into the muscle every 14 (fourteen) days. For IM use only, Disp: , Rfl: ;   venlafaxine XR (EFFEXOR-XR) 150 MG 24 hr capsule, Take 150 mg by mouth every evening., Disp: , Rfl:   No Known Allergies  History  Substance Use Topics  . Smoking status: Former Smoker    Types: Cigarettes    Quit date: 12/23/1968  . Smokeless tobacco:  Never Used  . Alcohol Use: No    Family History Father deceased age 27 due to complications from MI and CVA Mother deceased age 28 dur to CHF Brother deceased age 2 due to MI   Review of Systems  Constitutional: Negative.   HENT: Negative.  Negative for neck pain.   Eyes: Negative.   Respiratory: Negative.   Cardiovascular: Negative.   Gastrointestinal: Negative.   Genitourinary: Negative.   Musculoskeletal: Positive for myalgias, back pain and joint pain. Negative for falls.       Right hip pain  Neurological: Negative.   Endo/Heme/Allergies: Negative.   Psychiatric/Behavioral: Negative.     Objective:  Physical Exam  Constitutional: He is oriented to person, place, and time. He appears well-developed and well-nourished. No distress.  HENT:  Head: Normocephalic and atraumatic.  Right Ear: External ear normal.  Left Ear: External ear normal.  Nose: Nose normal.  Mouth/Throat: Oropharynx is clear and moist.  Eyes: Conjunctivae normal and EOM are normal.       Blind in right eye  Neck: Neck supple. No tracheal deviation present. No thyromegaly present.  Cardiovascular: Normal rate, regular rhythm, normal heart sounds and intact distal pulses.   No murmur heard. Respiratory: Breath sounds normal. No respiratory distress. He has no wheezes. He exhibits no tenderness.  GI: Soft. Bowel sounds are normal. He exhibits no distension and no mass. There is no tenderness.  Musculoskeletal:       Right hip: He exhibits decreased range of motion and decreased strength. He exhibits no tenderness.       Left hip: Normal.       Right knee: Normal.       Left knee: Normal.       Legs: Lymphadenopathy:    He has no cervical adenopathy.  Neurological: He is alert and oriented to person, place, and time. He has normal strength and normal reflexes. No sensory deficit.  Skin: No rash noted. No erythema.  Psychiatric: He has a normal mood and affect. His behavior is normal.     Vitals Weight: 235 lb Height: 70 in Body Surface Area: 2.29 m Body Mass Index: 33.72 kg/m Pulse: 62 (Regular) Resp.: 17 (Unlabored) BP: 142/80 (Sitting, Left Arm, Standard)  Imaging Review Plain radiographs demonstrate severe degenerative joint disease of the right hip(s). The bone quality appears to be good for age and reported activity level.  Assessment/Plan:  End stage arthritis, right hip(s)  The patient history, physical examination, clinical judgement of the provider and imaging studies are consistent with end stage degenerative joint disease of the right hip(s) and total hip arthroplasty is deemed medically necessary. The treatment options including medical management, injection therapy, arthroscopy and arthroplasty were discussed at length. The risks and benefits of total hip  arthroplasty were presented and reviewed. The risks due to aseptic loosening, infection, stiffness, dislocation/subluxation,  thromboembolic complications and other imponderables were discussed.  The patient acknowledged the explanation, agreed to proceed with the plan and consent was signed. Patient is being admitted for inpatient treatment for surgery, pain control, PT, OT, prophylactic antibiotics, VTE prophylaxis, progressive ambulation and ADL's and discharge planning.The patient is planning to be discharged home with home health services     [place], [place]

## 2012-01-03 ENCOUNTER — Inpatient Hospital Stay (HOSPITAL_COMMUNITY): Payer: Medicare Other | Admitting: *Deleted

## 2012-01-03 ENCOUNTER — Encounter (HOSPITAL_COMMUNITY): Payer: Self-pay | Admitting: Orthopedic Surgery

## 2012-01-03 ENCOUNTER — Encounter (HOSPITAL_COMMUNITY): Payer: Self-pay | Admitting: *Deleted

## 2012-01-03 ENCOUNTER — Encounter (HOSPITAL_COMMUNITY)
Admission: RE | Disposition: A | Discharge status: Home Health | Payer: Self-pay | Source: Ambulatory Visit | Attending: Orthopedic Surgery

## 2012-01-03 ENCOUNTER — Inpatient Hospital Stay (HOSPITAL_COMMUNITY)
Admission: RE | Admit: 2012-01-03 | Discharge: 2012-01-06 | DRG: 470 | Disposition: A | Discharge status: Home Health | Payer: Medicare Other | Source: Ambulatory Visit | Attending: Orthopedic Surgery | Admitting: Orthopedic Surgery

## 2012-01-03 ENCOUNTER — Inpatient Hospital Stay (HOSPITAL_COMMUNITY): Payer: Medicare Other

## 2012-01-03 DIAGNOSIS — Z471 Aftercare following joint replacement surgery: Secondary | ICD-10-CM | POA: Diagnosis not present

## 2012-01-03 DIAGNOSIS — I2510 Atherosclerotic heart disease of native coronary artery without angina pectoris: Secondary | ICD-10-CM | POA: Diagnosis present

## 2012-01-03 DIAGNOSIS — M25559 Pain in unspecified hip: Secondary | ICD-10-CM | POA: Diagnosis not present

## 2012-01-03 DIAGNOSIS — Z951 Presence of aortocoronary bypass graft: Secondary | ICD-10-CM | POA: Diagnosis not present

## 2012-01-03 DIAGNOSIS — M1610 Unilateral primary osteoarthritis, unspecified hip: Secondary | ICD-10-CM | POA: Diagnosis not present

## 2012-01-03 DIAGNOSIS — I10 Essential (primary) hypertension: Secondary | ICD-10-CM | POA: Diagnosis present

## 2012-01-03 DIAGNOSIS — M1611 Unilateral primary osteoarthritis, right hip: Secondary | ICD-10-CM | POA: Diagnosis present

## 2012-01-03 DIAGNOSIS — Z96649 Presence of unspecified artificial hip joint: Secondary | ICD-10-CM | POA: Diagnosis not present

## 2012-01-03 DIAGNOSIS — M169 Osteoarthritis of hip, unspecified: Secondary | ICD-10-CM | POA: Diagnosis present

## 2012-01-03 HISTORY — PX: TOTAL HIP ARTHROPLASTY: SHX124

## 2012-01-03 LAB — CREATININE, SERUM: Creatinine, Ser: 0.8 mg/dL (ref 0.50–1.35)

## 2012-01-03 LAB — TYPE AND SCREEN
ABO/RH(D): O NEG
Antibody Screen: NEGATIVE

## 2012-01-03 LAB — CBC
MCH: 26.9 pg (ref 26.0–34.0)
MCHC: 33.5 g/dL (ref 30.0–36.0)
MCV: 80.3 fL (ref 78.0–100.0)
Platelets: 310 10*3/uL (ref 150–400)

## 2012-01-03 SURGERY — ARTHROPLASTY, HIP, TOTAL,POSTERIOR APPROACH
Anesthesia: General | Site: Hip | Laterality: Right | Wound class: Clean

## 2012-01-03 MED ORDER — SODIUM CHLORIDE 0.9 % IR SOLN
Status: DC | PRN
  Administered 2012-01-03: 15:00:00

## 2012-01-03 MED ORDER — ALUM & MAG HYDROXIDE-SIMETH 200-200-20 MG/5ML PO SUSP: 30.0000 mL | ORAL | Status: DC | PRN

## 2012-01-03 MED ORDER — OXYCODONE-ACETAMINOPHEN 5-325 MG PO TABS
2.0000 | ORAL_TABLET | ORAL | Status: DC | PRN
  Administered 2012-01-04: 2 via ORAL
  Filled 2012-01-03: qty 2

## 2012-01-03 MED ORDER — CEFAZOLIN SODIUM-DEXTROSE 2-3 GM-% IV SOLR
2.0000 g | INTRAVENOUS | Status: AC
  Administered 2012-01-03: 2 g via INTRAVENOUS

## 2012-01-03 MED ORDER — LACTATED RINGERS IV SOLN
INTRAVENOUS | Status: DC
  Administered 2012-01-03 – 2012-01-05 (×4): via INTRAVENOUS

## 2012-01-03 MED ORDER — HYDROCODONE-ACETAMINOPHEN 7.5-325 MG PO TABS
1.0000 | ORAL_TABLET | ORAL | Status: DC | PRN
  Administered 2012-01-03 – 2012-01-04 (×3): 2 via ORAL
  Administered 2012-01-04: 1 via ORAL
  Administered 2012-01-04: 2 via ORAL
  Administered 2012-01-05 – 2012-01-06 (×3): 1 via ORAL
  Filled 2012-01-03 (×3): qty 2
  Filled 2012-01-03: qty 1
  Filled 2012-01-03 (×3): qty 2
  Filled 2012-01-03 (×2): qty 1

## 2012-01-03 MED ORDER — MENTHOL 3 MG MT LOZG
1.0000 | LOZENGE | OROMUCOSAL | Status: DC | PRN
  Filled 2012-01-03: qty 9

## 2012-01-03 MED ORDER — ONDANSETRON HCL 4 MG/2ML IJ SOLN
4.0000 mg | Freq: Four times a day (QID) | INTRAMUSCULAR | Status: DC | PRN
  Administered 2012-01-04: 4 mg via INTRAVENOUS
  Filled 2012-01-03: qty 2

## 2012-01-03 MED ORDER — HYDROMORPHONE HCL PF 1 MG/ML IJ SOLN
1.0000 mg | INTRAMUSCULAR | Status: DC | PRN
  Administered 2012-01-03 – 2012-01-04 (×4): 1 mg via INTRAVENOUS
  Filled 2012-01-03 (×4): qty 1

## 2012-01-03 MED ORDER — PNEUMOCOCCAL VAC POLYVALENT 25 MCG/0.5ML IJ INJ
0.5000 mL | INJECTION | INTRAMUSCULAR | Status: AC
  Administered 2012-01-04: 0.5 mL via INTRAMUSCULAR
  Filled 2012-01-03: qty 0.5

## 2012-01-03 MED ORDER — KETOROLAC TROMETHAMINE 30 MG/ML IJ SOLN: 15.0000 mg | Freq: Once | INTRAMUSCULAR | Status: DC | PRN

## 2012-01-03 MED ORDER — PHENYLEPHRINE HCL 10 MG/ML IJ SOLN
INTRAMUSCULAR | Status: DC | PRN
  Administered 2012-01-03: 120 ug via INTRAVENOUS
  Administered 2012-01-03: 40 ug via INTRAVENOUS

## 2012-01-03 MED ORDER — DEXAMETHASONE SODIUM PHOSPHATE 4 MG/ML IJ SOLN
INTRAMUSCULAR | Status: DC | PRN
  Administered 2012-01-03: 10 mg via INTRAVENOUS

## 2012-01-03 MED ORDER — ACETAMINOPHEN 325 MG PO TABS: 650.0000 mg | ORAL_TABLET | Freq: Four times a day (QID) | ORAL | Status: DC | PRN

## 2012-01-03 MED ORDER — LEVOTHYROXINE SODIUM 137 MCG PO TABS
137.0000 ug | ORAL_TABLET | ORAL | Status: DC
  Administered 2012-01-04 – 2012-01-06 (×2): 137 ug via ORAL
  Filled 2012-01-03 (×2): qty 1

## 2012-01-03 MED ORDER — HYDROMORPHONE HCL PF 2 MG/ML IJ SOLN
2.0000 mg | Freq: Once | INTRAMUSCULAR | Status: AC
  Administered 2012-01-03: 2 mg via INTRAVENOUS
  Filled 2012-01-03: qty 1

## 2012-01-03 MED ORDER — CARVEDILOL 12.5 MG PO TABS
12.5000 mg | ORAL_TABLET | Freq: Two times a day (BID) | ORAL | Status: DC
  Administered 2012-01-04 – 2012-01-06 (×5): 12.5 mg via ORAL
  Filled 2012-01-03 (×7): qty 1

## 2012-01-03 MED ORDER — FERROUS SULFATE 325 (65 FE) MG PO TABS
325.0000 mg | ORAL_TABLET | Freq: Three times a day (TID) | ORAL | Status: DC
  Administered 2012-01-04 – 2012-01-06 (×7): 325 mg via ORAL
  Filled 2012-01-03 (×10): qty 1

## 2012-01-03 MED ORDER — BUPIVACAINE LIPOSOME 1.3 % IJ SUSP
20.0000 mL | Freq: Once | INTRAMUSCULAR | Status: AC
  Administered 2012-01-03: 20 mL
  Filled 2012-01-03: qty 20

## 2012-01-03 MED ORDER — SODIUM CHLORIDE 0.9 % IJ SOLN
INTRAMUSCULAR | Status: DC | PRN
  Administered 2012-01-03: 20 mL via INTRAVENOUS

## 2012-01-03 MED ORDER — LEVOTHYROXINE SODIUM 137 MCG PO TABS: 137.0000 ug | ORAL_TABLET | ORAL | Status: DC

## 2012-01-03 MED ORDER — ONDANSETRON HCL 4 MG/2ML IJ SOLN
INTRAMUSCULAR | Status: DC | PRN
  Administered 2012-01-03 (×2): 2 mg via INTRAVENOUS

## 2012-01-03 MED ORDER — METOCLOPRAMIDE HCL 5 MG/ML IJ SOLN
INTRAMUSCULAR | Status: DC | PRN
  Administered 2012-01-03: 10 mg via INTRAVENOUS

## 2012-01-03 MED ORDER — ROCURONIUM BROMIDE 100 MG/10ML IV SOLN
INTRAVENOUS | Status: DC | PRN
  Administered 2012-01-03: 50 mg via INTRAVENOUS

## 2012-01-03 MED ORDER — METHOCARBAMOL 100 MG/ML IJ SOLN
500.0000 mg | Freq: Four times a day (QID) | INTRAVENOUS | Status: DC | PRN
  Administered 2012-01-03 – 2012-01-04 (×2): 500 mg via INTRAVENOUS
  Filled 2012-01-03 (×2): qty 5

## 2012-01-03 MED ORDER — THROMBIN 5000 UNITS EX SOLR
CUTANEOUS | Status: DC | PRN
  Administered 2012-01-03: 10000 [IU] via TOPICAL

## 2012-01-03 MED ORDER — HYDROMORPHONE HCL PF 1 MG/ML IJ SOLN
INTRAMUSCULAR | Status: DC | PRN
  Administered 2012-01-03: 0.5 mg via INTRAVENOUS
  Administered 2012-01-03: 1 mg via INTRAVENOUS
  Administered 2012-01-03: 0.5 mg via INTRAVENOUS

## 2012-01-03 MED ORDER — EZETIMIBE 10 MG PO TABS
10.0000 mg | ORAL_TABLET | Freq: Every day | ORAL | Status: DC
  Administered 2012-01-03 – 2012-01-05 (×3): 10 mg via ORAL
  Filled 2012-01-03 (×5): qty 1

## 2012-01-03 MED ORDER — VENLAFAXINE HCL ER 150 MG PO CP24
150.0000 mg | ORAL_CAPSULE | Freq: Every day | ORAL | Status: DC
  Administered 2012-01-03 – 2012-01-05 (×3): 150 mg via ORAL
  Filled 2012-01-03 (×5): qty 1

## 2012-01-03 MED ORDER — MIDAZOLAM HCL 5 MG/5ML IJ SOLN
INTRAMUSCULAR | Status: DC | PRN
  Administered 2012-01-03: 2 mg via INTRAVENOUS

## 2012-01-03 MED ORDER — LEVOTHYROXINE SODIUM 137 MCG PO TABS
205.5000 ug | ORAL_TABLET | ORAL | Status: DC
  Administered 2012-01-05: 205.5 ug via ORAL
  Filled 2012-01-03 (×2): qty 1.5

## 2012-01-03 MED ORDER — RIVAROXABAN 10 MG PO TABS
10.0000 mg | ORAL_TABLET | Freq: Every day | ORAL | Status: DC
  Administered 2012-01-04 – 2012-01-06 (×3): 10 mg via ORAL
  Filled 2012-01-03 (×4): qty 1

## 2012-01-03 MED ORDER — CEFAZOLIN SODIUM 1-5 GM-% IV SOLN
1.0000 g | Freq: Four times a day (QID) | INTRAVENOUS | Status: AC
  Administered 2012-01-03 – 2012-01-04 (×2): 1 g via INTRAVENOUS
  Filled 2012-01-03 (×2): qty 50

## 2012-01-03 MED ORDER — FENTANYL CITRATE 0.05 MG/ML IJ SOLN
INTRAMUSCULAR | Status: DC | PRN
  Administered 2012-01-03 (×5): 50 ug via INTRAVENOUS

## 2012-01-03 MED ORDER — LACTATED RINGERS IV SOLN
INTRAVENOUS | Status: DC
  Administered 2012-01-03: 1000 mL via INTRAVENOUS

## 2012-01-03 MED ORDER — ACETAMINOPHEN 650 MG RE SUPP: 650.0000 mg | Freq: Four times a day (QID) | RECTAL | Status: DC | PRN

## 2012-01-03 MED ORDER — KETAMINE HCL 10 MG/ML IJ SOLN
INTRAMUSCULAR | Status: DC | PRN
  Administered 2012-01-03: 1 mg via INTRAVENOUS
  Administered 2012-01-03: 25 mg via INTRAVENOUS
  Administered 2012-01-03 (×4): 1 mg via INTRAVENOUS

## 2012-01-03 MED ORDER — HYDROMORPHONE HCL PF 1 MG/ML IJ SOLN: 0.2500 mg | INTRAMUSCULAR | Status: DC | PRN

## 2012-01-03 MED ORDER — PROMETHAZINE HCL 25 MG/ML IJ SOLN: 6.2500 mg | INTRAMUSCULAR | Status: DC | PRN

## 2012-01-03 MED ORDER — PROPOFOL 10 MG/ML IV BOLUS
INTRAVENOUS | Status: DC | PRN
  Administered 2012-01-03: 200 mg via INTRAVENOUS

## 2012-01-03 MED ORDER — ACETAMINOPHEN 10 MG/ML IV SOLN
INTRAVENOUS | Status: DC | PRN
  Administered 2012-01-03: 1000 mg via INTRAVENOUS

## 2012-01-03 MED ORDER — ONDANSETRON HCL 4 MG PO TABS
4.0000 mg | ORAL_TABLET | Freq: Four times a day (QID) | ORAL | Status: DC | PRN
  Administered 2012-01-04: 4 mg via ORAL
  Filled 2012-01-03: qty 1

## 2012-01-03 MED ORDER — PHENOL 1.4 % MT LIQD
1.0000 | OROMUCOSAL | Status: DC | PRN
  Filled 2012-01-03: qty 177

## 2012-01-03 MED ORDER — METHOCARBAMOL 500 MG PO TABS
500.0000 mg | ORAL_TABLET | Freq: Four times a day (QID) | ORAL | Status: DC | PRN
  Administered 2012-01-04 – 2012-01-06 (×6): 500 mg via ORAL
  Filled 2012-01-03 (×6): qty 1

## 2012-01-03 MED ORDER — SIMVASTATIN 5 MG PO TABS
5.0000 mg | ORAL_TABLET | Freq: Every day | ORAL | Status: DC
  Administered 2012-01-03 – 2012-01-05 (×3): 5 mg via ORAL
  Filled 2012-01-03 (×4): qty 1

## 2012-01-03 SURGICAL SUPPLY — 63 items
BAG ZIPLOCK 12X15 (MISCELLANEOUS) ×2 IMPLANT
BLADE SAW SAG 73X25 THK (BLADE) ×1
BLADE SAW SGTL 73X25 THK (BLADE) ×1 IMPLANT
CLOTH BEACON ORANGE TIMEOUT ST (SAFETY) ×2 IMPLANT
DERMABOND ADVANCED (GAUZE/BANDAGES/DRESSINGS) ×1
DERMABOND ADVANCED .7 DNX12 (GAUZE/BANDAGES/DRESSINGS) ×1 IMPLANT
DRAPE INCISE IOBAN 66X45 STRL (DRAPES) ×2 IMPLANT
DRAPE INCISE IOBAN 85X60 (DRAPES) ×2 IMPLANT
DRAPE ORTHO SPLIT 77X108 STRL (DRAPES) ×2
DRAPE POUCH INSTRU U-SHP 10X18 (DRAPES) ×2 IMPLANT
DRAPE SURG 17X11 SM STRL (DRAPES) ×2 IMPLANT
DRAPE SURG ORHT 6 SPLT 77X108 (DRAPES) ×2 IMPLANT
DRAPE U-SHAPE 47X51 STRL (DRAPES) ×2 IMPLANT
DRSG AQUACEL AG ADV 3.5X14 (GAUZE/BANDAGES/DRESSINGS) ×2 IMPLANT
DRSG EMULSION OIL 3X16 NADH (GAUZE/BANDAGES/DRESSINGS) ×2 IMPLANT
DRSG PAD ABDOMINAL 8X10 ST (GAUZE/BANDAGES/DRESSINGS) ×4 IMPLANT
DRSG TEGADERM 4X4.75 (GAUZE/BANDAGES/DRESSINGS) ×2 IMPLANT
DURAPREP 26ML APPLICATOR (WOUND CARE) ×2 IMPLANT
ELECT BLADE TIP CTD 4 INCH (ELECTRODE) ×2 IMPLANT
ELECT REM PT RETURN 9FT ADLT (ELECTROSURGICAL) ×2
ELECTRODE REM PT RTRN 9FT ADLT (ELECTROSURGICAL) ×1 IMPLANT
EVACUATOR 1/8 PVC DRAIN (DRAIN) ×2 IMPLANT
FACESHIELD LNG OPTICON STERILE (SAFETY) ×8 IMPLANT
FLOSEAL 10ML (HEMOSTASIS) ×2 IMPLANT
GAUZE SPONGE 2X2 8PLY STRL LF (GAUZE/BANDAGES/DRESSINGS) ×1 IMPLANT
GLOVE BIOGEL M 6.5 STRL (GLOVE) ×2 IMPLANT
GLOVE BIOGEL PI IND STRL 7.0 (GLOVE) ×2 IMPLANT
GLOVE BIOGEL PI IND STRL 7.5 (GLOVE) ×1 IMPLANT
GLOVE BIOGEL PI IND STRL 8 (GLOVE) ×2 IMPLANT
GLOVE BIOGEL PI IND STRL 8.5 (GLOVE) ×1 IMPLANT
GLOVE BIOGEL PI INDICATOR 7.0 (GLOVE) ×2
GLOVE BIOGEL PI INDICATOR 7.5 (GLOVE) ×1
GLOVE BIOGEL PI INDICATOR 8 (GLOVE) ×2
GLOVE BIOGEL PI INDICATOR 8.5 (GLOVE) ×1
GLOVE ECLIPSE 8.0 STRL XLNG CF (GLOVE) ×10 IMPLANT
GLOVE ORTHO TXT STRL SZ7.5 (GLOVE) ×4 IMPLANT
GLOVE SURG SS PI 6.5 STRL IVOR (GLOVE) ×4 IMPLANT
GOWN PREVENTION PLUS LG XLONG (DISPOSABLE) ×4 IMPLANT
GOWN STRL NON-REIN LRG LVL3 (GOWN DISPOSABLE) ×4 IMPLANT
GOWN STRL REIN 2XL XLG LVL4 (GOWN DISPOSABLE) ×2 IMPLANT
GOWN STRL REIN XL XLG (GOWN DISPOSABLE) ×2 IMPLANT
IMMOBILIZER KNEE 20 (SOFTGOODS) ×2
IMMOBILIZER KNEE 20 THIGH 36 (SOFTGOODS) ×1 IMPLANT
KIT BASIN OR (CUSTOM PROCEDURE TRAY) ×2 IMPLANT
MANIFOLD NEPTUNE II (INSTRUMENTS) ×2 IMPLANT
NEEDLE HYPO 22GX1.5 SAFETY (NEEDLE) ×2 IMPLANT
PACK TOTAL JOINT (CUSTOM PROCEDURE TRAY) ×2 IMPLANT
POSITIONER SURGICAL ARM (MISCELLANEOUS) ×2 IMPLANT
SPONGE GAUZE 2X2 STER 10/PKG (GAUZE/BANDAGES/DRESSINGS) ×1
SPONGE LAP 18X18 X RAY DECT (DISPOSABLE) ×4 IMPLANT
SPONGE LAP 4X18 X RAY DECT (DISPOSABLE) ×2 IMPLANT
SPONGE SURGIFOAM ABS GEL 100 (HEMOSTASIS) ×2 IMPLANT
STAPLER VISISTAT 35W (STAPLE) ×2 IMPLANT
SUCTION FRAZIER TIP 10 FR DISP (SUCTIONS) ×2 IMPLANT
SUT MNCRL AB 4-0 PS2 18 (SUTURE) ×2 IMPLANT
SUT VIC AB 0 CT1 27 (SUTURE) ×2
SUT VIC AB 0 CT1 27XBRD ANTBC (SUTURE) ×2 IMPLANT
SUT VIC AB 1 CT1 27 (SUTURE) ×6
SUT VIC AB 1 CT1 27XBRD ANTBC (SUTURE) ×6 IMPLANT
SYR 20CC LL (SYRINGE) ×2 IMPLANT
TOWEL OR 17X26 10 PK STRL BLUE (TOWEL DISPOSABLE) ×4 IMPLANT
TRAY FOLEY CATH 14FRSI W/METER (CATHETERS) ×2 IMPLANT
WATER STERILE IRR 1500ML POUR (IV SOLUTION) ×2 IMPLANT

## 2012-01-03 NOTE — Interval H&P Note (Signed)
History and Physical Interval Note:  01/03/2012 12:36 PM  Gary [name]  has presented today for surgery, with the diagnosis of OA OF THE RIGHT HIP  The various methods of treatment have been discussed with the patient and family. After consideration of risks, benefits and other options for treatment, the patient has consented to  Procedure(s) (LRB) with comments: TOTAL HIP ARTHROPLASTY (Right) as a surgical intervention .  The patient's history has been reviewed, patient examined, no change in status, stable for surgery.  I have reviewed the patient's chart and labs.  Questions were answered to the patient's satisfaction.     Draden Cottingham A

## 2012-01-03 NOTE — Interval H&P Note (Signed)
History and Physical Interval Note:  01/03/2012 12:35 PM  Gary [name]  has presented today for surgery, with the diagnosis of OA OF THE RIGHT HIP  The various methods of treatment have been discussed with the patient and family. After consideration of risks, benefits and other options for treatment, the patient has consented to  Procedure(s) (LRB) with comments: TOTAL HIP ARTHROPLASTY (Right) as a surgical intervention .  The patient's history has been reviewed, patient examined, no change in status, stable for surgery.  I have reviewed the patient's chart and labs.  Questions were answered to the patient's satisfaction.     [name] A

## 2012-01-03 NOTE — Brief Op Note (Signed)
01/03/2012  3:10 PM  PATIENT:  Gary [name]  66 y.o. male  PRE-OPERATIVE DIAGNOSIS:  OA OF THE RIGHT HIP Complex  POST-OPERATIVE DIAGNOSIS:  Osteoarthritis of the right hip,Complex  PROCEDURE:  Procedure(s) (LRB) with comments: TOTAL HIP ARTHROPLASTY (Right)  SURGEON:  Surgeon(s) and Role:    * Shelda Pal, MD - Assisting    * Jacki Cones, MD - Primary  PHYSICIAN ASSISTANT: Freddie Breech PA  ASSISTANTS: Durene Romans MD   ANESTHESIA:   general  EBL:  Total I/O In: 700 [I.V.:700] Out: 500 [Urine:200; Blood:300]  BLOOD ADMINISTERED:none  DRAINS: (one) Hemovact drain(s) in the right with  Suction Open   LOCAL MEDICATIONS USED:  BUPIVICAINE 20cc mixed with 20cc Normal Saline   SPECIMEN:  No Specimen  DISPOSITION OF SPECIMEN:  N/A  COUNTS:  YES  TOURNIQUET:  * No tourniquets in log *  DICTATION: .Other Dictation: Dictation Number [id number]  PLAN OF CARE: Admit to inpatient   PATIENT DISPOSITION:  Stable in OR   Delay start of Pharmacological VTE agent (>24hrs) due to surgical blood loss or risk of bleeding: yes

## 2012-01-03 NOTE — Anesthesia Postprocedure Evaluation (Signed)
  Anesthesia Post-op Note  Patient: Gary [name]  Procedure(s) Performed: Procedure(s) (LRB): TOTAL HIP ARTHROPLASTY (Right)  Patient Location: PACU  Anesthesia Type: General  Level of Consciousness: awake and alert   Airway and Oxygen Therapy: Patient Spontanous Breathing  Post-op Pain: mild  Post-op Assessment: Post-op Vital signs reviewed, Patient's Cardiovascular Status Stable, Respiratory Function Stable, Patent Airway and No signs of Nausea or vomiting  Last Vitals:  Filed Vitals:   01/03/12 1649  BP: 147/73  Pulse: 66  Temp: 36.6 C  Resp: 16    Post-op Vital Signs: stable   Complications: No apparent anesthesia complications

## 2012-01-03 NOTE — Preoperative (Addendum)
Beta Blockers   Reason not to administer Beta Blockers:Not Applicable, took BB today

## 2012-01-03 NOTE — Progress Notes (Signed)
Pt complaining of severe pain at surgical site. Nurse has exhausted current pain med options, nursing comfort measures also provided. Paged on-call PA and reported to next nurse medications given and the fact that the PA has been paged. Med schedule explained to pt. New orders received from PA (Additional pain medicine ordered). Call bell in reach. Will continue to assess pt. Opportunities for questions provided to pt.

## 2012-01-03 NOTE — Transfer of Care (Signed)
Immediate Anesthesia Transfer of Care Note  Patient: Gary [name]  Procedure(s) Performed: Procedure(s) (LRB) with comments: TOTAL HIP ARTHROPLASTY (Right)  Patient Location: PACU  Anesthesia Type:General  Level of Consciousness: awake, alert , oriented and patient cooperative  Airway & Oxygen Therapy: Patient Spontanous Breathing and Patient connected to face mask oxygen  Post-op Assessment: Report given to PACU RN, Post -op Vital signs reviewed and stable, Patient moving all extremities and Patient moving all extremities X 4  Post vital signs: Reviewed and stable  Complications: No apparent anesthesia complications

## 2012-01-03 NOTE — Anesthesia Preprocedure Evaluation (Signed)
Anesthesia Evaluation  Patient identified by MRN, date of birth, ID band Patient awake    Reviewed: Allergy & Precautions, H&P , NPO status , Patient's Chart, lab work & pertinent test results  Airway Mallampati: III TM Distance: <3 FB Neck ROM: Full    Dental No notable dental hx.    Pulmonary sleep apnea ,  breath sounds clear to auscultation  Pulmonary exam normal       Cardiovascular hypertension, + CAD, + Cardiac Stents, + CABG and +CHF negative cardio ROS  Rhythm:Regular Rate:Normal     Neuro/Psych negative neurological ROS  negative psych ROS   GI/Hepatic negative GI ROS, Neg liver ROS,   Endo/Other  Morbid obesity  Renal/GU negative Renal ROS  negative genitourinary   Musculoskeletal negative musculoskeletal ROS (+)   Abdominal   Peds negative pediatric ROS (+)  Hematology negative hematology ROS (+)   Anesthesia Other Findings   Reproductive/Obstetrics negative OB ROS                           Anesthesia Physical Anesthesia Plan  ASA: III  Anesthesia Plan: General   Post-op Pain Management:    Induction: Intravenous  Airway Management Planned: Oral ETT  Additional Equipment:   Intra-op Plan:   Post-operative Plan: Extubation in OR  Informed Consent: I have reviewed the patients History and Physical, chart, labs and discussed the procedure including the risks, benefits and alternatives for the proposed anesthesia with the patient or authorized representative who has indicated his/her understanding and acceptance.   Dental advisory given  Plan Discussed with: CRNA and Surgeon  Anesthesia Plan Comments: (Fluid restrict)        Anesthesia Quick Evaluation

## 2012-01-04 ENCOUNTER — Encounter (HOSPITAL_COMMUNITY): Payer: Self-pay | Admitting: Orthopedic Surgery

## 2012-01-04 LAB — BASIC METABOLIC PANEL
BUN: 24 mg/dL — ABNORMAL HIGH (ref 6–23)
Calcium: 8.4 mg/dL (ref 8.4–10.5)
Creatinine, Ser: 0.93 mg/dL (ref 0.50–1.35)
GFR calc non Af Amer: 84 mL/min — ABNORMAL LOW (ref >90)
Glucose, Bld: 176 mg/dL — ABNORMAL HIGH (ref 70–99)

## 2012-01-04 LAB — CBC
HCT: 34.6 % — ABNORMAL LOW (ref 39.0–52.0)
Hemoglobin: 11.1 g/dL — ABNORMAL LOW (ref 13.0–17.0)
MCH: 26.2 pg (ref 26.0–34.0)
MCHC: 32.1 g/dL (ref 30.0–36.0)
MCV: 81.8 fL (ref 78.0–100.0)

## 2012-01-04 MED ORDER — PREDNISONE (PAK) 5 MG PO TABS
5.0000 mg | ORAL_TABLET | ORAL | Status: AC
  Administered 2012-01-04: 5 mg via ORAL

## 2012-01-04 MED ORDER — ZOLPIDEM TARTRATE 5 MG PO TABS
5.0000 mg | ORAL_TABLET | Freq: Every evening | ORAL | Status: DC | PRN
  Administered 2012-01-04 – 2012-01-05 (×2): 5 mg via ORAL
  Filled 2012-01-04 (×2): qty 1

## 2012-01-04 MED ORDER — PREDNISONE (PAK) 5 MG PO TABS
5.0000 mg | ORAL_TABLET | Freq: Four times a day (QID) | ORAL | Status: DC
  Administered 2012-01-06: 5 mg via ORAL

## 2012-01-04 MED ORDER — PREDNISONE (PAK) 5 MG PO TABS
10.0000 mg | ORAL_TABLET | Freq: Every evening | ORAL | Status: AC
  Administered 2012-01-04: 10 mg via ORAL

## 2012-01-04 MED ORDER — PREDNISONE (PAK) 5 MG PO TABS
10.0000 mg | ORAL_TABLET | Freq: Every morning | ORAL | Status: AC
  Administered 2012-01-04: 10 mg via ORAL
  Filled 2012-01-04: qty 21

## 2012-01-04 MED ORDER — PREDNISONE (PAK) 5 MG PO TABS
10.0000 mg | ORAL_TABLET | Freq: Every evening | ORAL | Status: AC
  Administered 2012-01-05: 10 mg via ORAL

## 2012-01-04 MED ORDER — ZOLPIDEM TARTRATE 10 MG PO TABS: 10.0000 mg | ORAL_TABLET | Freq: Every evening | ORAL | Status: DC | PRN

## 2012-01-04 MED ORDER — PREDNISONE (PAK) 5 MG PO TABS
5.0000 mg | ORAL_TABLET | Freq: Three times a day (TID) | ORAL | Status: AC
  Administered 2012-01-05 (×3): 5 mg via ORAL

## 2012-01-04 NOTE — Progress Notes (Signed)
Subjective: 1 Day Post-Op Procedure(s) (LRB): TOTAL HIP ARTHROPLASTY (Right) Patient reports pain as 3 on 0-10 scale.  Numbness over Dorsum of Right foot and no other areas of numbness. Minimal weakness of right foot dorsiflexors but he is able to Plantar flex and dorsiflex his foot.His hemocac was Dcd and he has some bloody drainage from the drain site.  Objective: Vital signs in last 24 hours: Temp:  [97.4 F (36.3 C)-98.8 F (37.1 C)] 98 F (36.7 C) (12/31 1414) Pulse Rate:  [55-97] 55  (12/31 1414) Resp:  [16-19] 16  (12/31 1414) BP: (107-157)/(65-85) 114/66 mmHg (12/31 1414) SpO2:  [95 %-100 %] 96 % (12/31 1414)  Intake/Output from previous day: 12/30 0701 - 12/31 0700 In: 4251.7 [P.O.:960; I.V.:3236.7; IV Piggyback:55] Out: 1280 [Urine:825; Emesis/NG output:50; Drains:105; Blood:300] Intake/Output this shift: Total I/O In: 1197 [P.O.:480; I.V.:660; IV Piggyback:57] Out: -    Basename 01/04/12 0419 01/03/12 1743  HGB 11.1* 13.0    Basename 01/04/12 0419 01/03/12 1743  WBC 15.8* 23.3*  RBC 4.23 4.83  HCT 34.6* 38.8*  PLT 309 310    Basename 01/04/12 0419 01/03/12 1743  NA 137 --  K 4.5 --  CL 103 --  CO2 27 --  BUN 24* --  CREATININE 0.93 0.80  GLUCOSE 176* --  CALCIUM 8.4 --   No results found for this basename: LABPT:2,INR:2 in the last 72 hours  Compartment soft  Assessment/Plan: 1 Day Post-Op Procedure(s) (LRB): TOTAL HIP ARTHROPLASTY (Right) Up with therapydiscontinued plans for Thursday,if stable.  Amadea Keagy A 01/04/2012, 4:53 PM

## 2012-01-04 NOTE — Op Note (Signed)
NAMEELMAN, DETTMAN             ACCOUNT NO.:  [id number]  MEDICAL RECORD NO.:  [id number]  LOCATION:  1612                         FACILITY:  [facility]  PHYSICIAN:  Georges Lynch. Sahmir Weatherbee, M.D.DATE OF BIRTH:  June 21, 1945  DATE OF PROCEDURE:  01/03/2012 DATE OF DISCHARGE:                              OPERATIVE REPORT   SURGEON:  Georges Lynch. Darrelyn Hillock, M.D.  ASSISTANT:  Madlyn Frankel. Charlann Boxer, M.D. and Lanney Gins, [place].  PREOPERATIVE DIAGNOSIS:  Complex severe degenerative arthritis, bone-on- bone hip on the right.  POSTOPERATIVE DIAGNOSIS:  Complex severe degenerative arthritis, bone-on- bone hip on the right.  OPERATION:  Right total hip arthroplasty utilizing the size 5 Tri-Lock stem high offset with a +5 36 mm diameter ceramic ball.  The cup was a 52 mm pinnacle cup with 1 screw and a hole eliminator used.  The insert was a 36 mm inside diameter AltrX polyethylene liner.  PROCEDURE:  Under general anesthesia, the patient on left side and right side up, routine orthopedic prep and draping of the right hip was carried out.  He had 2 g of IV Ancef.  Appropriate time-out was carried out prior to surgery.  I also marked the appropriate right leg in the holding area.  Posterolateral approach to the hip was carried out. Bleeders were identified and cauterized.  At this time, the incision was carried through the iliotibial band.  I partially detached the external rotators.  Great care was taken not to injure the sciatic nerve.  At this time, I did a capsulectomy, dislocated the hip, amputated the femoral head at the appropriate neck length.  Following that, I utilized my box osteotome to remove the cancellous bone from the greater trochanter.  I then utilized my widening reamer and then my canal finder.  I thoroughly irrigated out the canal.  I then rasped the canal up to a size 5 Tri-Lock stem.  I thoroughly irrigated out the canal, packed the canal, opened and reamed the acetabulum up to 51 mm  for a 52 mm cup.  We completed the capsulectomy.  At this time, we then inserted our permanent Pinnacle cup, size 52 mm in diameter.  We utilized the Charnley guide to make sure we had the appropriate alignment of the cup. Following that, we then made our 1st drill hole into the acetabulum and 1 screw was inserted for the cup fixation.  Following that, we inserted our hole eliminator.  At that particular time, we then inserted our AltrX cup.  We then went through several trials.  We first started out with a 1.5 and then went to a size +5 ball for stability purposes.  We measured leg lengths at all times as well.  We felt that the most stable construct that we could use was the +5 ball.  At this time, we removed our trial components, inserted our permanent Tri-Lock stem with the appropriate anteversion.  We then went through trials once again with a 1.5 and a +5 ball and felt that the +5 ball was most stable.  We also checked leg lengths.  Following that, we also noted we did have a leg length shortening at the beginning of the procedure approximately 0.25  to 0.5 inch and more likely 0.5 inch short.  Following that, we then inserted our permanent 36 mm diameter ceramic ball.  We cleared the acetabulum, made sure there was no soft tissue present.  We reduced the hip, took the hip through motion, and had excellent stability and excellent leg lengths.  We then closed the wound in layers in usual fashion.  I injected a mixture of 20 mL of Exparel with 20 mL of normal saline into the soft tissue structures. Subcu was closed with running locking Monocryl suture.          ______________________________ Georges Lynch Darrelyn Hillock, M.D.     RAG/MEDQ  D:  01/03/2012  T:  01/04/2012  Job:  [id number]

## 2012-01-04 NOTE — Evaluation (Signed)
Physical Therapy Evaluation Patient Details Name: Gary [name] MRN: [id number] DOB: Apr 18, 1945 Today's Date: 01/04/2012 Time: 0911-0940 PT Time Calculation (min): 29 min  PT Assessment / Plan / Recommendation Clinical Impression  pt is s/p right THA and will benefit from PT to maximize independence for home wtih wife assist prn    PT Assessment  Patient needs continued PT services    Follow Up Recommendations  Home health PT    Does the patient have the potential to tolerate intense rehabilitation      Barriers to Discharge        Equipment Recommendations  None recommended by PT    Recommendations for Other Services     Frequency 7X/week    Precautions / Restrictions Precautions Precautions: Posterior Hip Precaution Comments: sign in room Restrictions Weight Bearing Restrictions: No RLE Weight Bearing: Partial weight bearing   Pertinent Vitals/Pain       Mobility  Bed Mobility Bed Mobility: Supine to Sit Supine to Sit: 4: Min assist Details for Bed Mobility Assistance: cues for technique, THP Transfers Transfers: Sit to Stand;Stand to Sit Sit to Stand: 4: Min assist;From bed Stand to Sit: 4: Min assist;To chair/3-in-1 Details for Transfer Assistance: cues for hand placement and THP, RLE position Ambulation/Gait Ambulation/Gait Assistance: 4: Min assist;4: Min Government social research officer (Feet): 55 Feet Assistive device: Rolling walker Ambulation/Gait Assistance Details: cues for sequence, RW distance from self and PWB Gait Pattern: Step-to pattern Gait velocity: decreased    Shoulder Instructions     Exercises Total Joint Exercises Ankle Circles/Pumps: AROM;Both;10 reps Heel Slides: 10 reps;AAROM;Right   PT Diagnosis: Difficulty walking  PT Problem List: Decreased strength;Decreased range of motion;Decreased activity tolerance;Decreased balance;Decreased mobility;Decreased knowledge of use of DME;Decreased knowledge of precautions PT Treatment  Interventions: DME instruction;Gait training;Stair training;Functional mobility training;Therapeutic activities;Therapeutic exercise;Patient/family education   PT Goals Acute Rehab PT Goals PT Goal Formulation: With patient Time For Goal Achievement: 01/11/12 Potential to Achieve Goals: Good Pt will go Supine/Side to Sit: with supervision PT Goal: Supine/Side to Sit - Progress: Goal set today Pt will go Sit to Supine/Side: with supervision PT Goal: Sit to Supine/Side - Progress: Goal set today Pt will go Sit to Stand: with supervision PT Goal: Sit to Stand - Progress: Goal set today Pt will go Stand to Sit: with supervision PT Goal: Stand to Sit - Progress: Goal set today Pt will Ambulate: 51 - 150 feet;with supervision;with rolling walker PT Goal: Ambulate - Progress: Goal set today Pt will Go Up / Down Stairs: 1-2 stairs;with min assist;with least restrictive assistive device PT Goal: Up/Down Stairs - Progress: Goal set today  Visit Information  Last PT Received On: 01/04/12 Assistance Needed: +1    Subjective Data  Subjective: I am ok, hurting Patient Stated Goal: home   Prior Functioning  Home Living Lives With: Spouse Available Help at Discharge: Family Type of Home: House Home Access: Stairs to enter Secretary/administrator of Steps: 2 Home Layout: Two level;Able to live on main level with bedroom/bathroom Home Adaptive Equipment: Walker - rolling;Bedside commode/3-in-1 Prior Function Level of Independence: Independent with assistive device(s) Able to Take Stairs?: Yes Driving: Yes Communication Communication: No difficulties    Cognition  Overall Cognitive Status: Appears within functional limits for tasks assessed/performed Arousal/Alertness: Awake/alert Orientation Level: Appears intact for tasks assessed Behavior During Session: [facility] for tasks performed    Extremity/Trunk Assessment Right Upper Extremity Assessment RUE ROM/Strength/Tone: [facility] for tasks  assessed Left Upper Extremity Assessment LUE ROM/Strength/Tone: [facility] for tasks assessed  Right Lower Extremity Assessment RLE ROM/Strength/Tone: Deficits;Unable to fully assess;Due to pain RLE ROM/Strength/Tone Deficits: ankle AROM WFL, AAROM hip and WFL within precautions see below for deficits RLE Sensation: Deficits RLE Sensation Deficits: intact to light touch,  but states that leg feels "funny, numb and tingly";  Left Lower Extremity Assessment LLE ROM/Strength/Tone: WFL for tasks assessed   Balance    End of Session PT - End of Session Equipment Utilized During Treatment: Gait belt Activity Tolerance: Patient tolerated treatment well Patient left: in chair;with call bell/phone within reach;with family/visitor present Nurse Communication: Mobility status  GP     [facility] 01/04/2012, 9:47 AM

## 2012-01-04 NOTE — Care Management Note (Signed)
    Page 1 of 2   01/07/2012     12:21:21 PM   CARE MANAGEMENT NOTE 01/07/2012  Patient:  Gary [name], Gary [name]   Account Number:  [id number]  Date Initiated:  01/04/2012  Documentation initiated by:  Colleen Can  Subjective/Objective Assessment:   dx total hip replacemnt on day of admission     Action/Plan:   Cm spoke with patient and spouse. Plans  are for patient to return to his home in [place] where spouse will be caregiver. He already has DME including RW.   Anticipated DC Date:  01/06/2011   Anticipated DC Plan:  HOME W HOME HEALTH SERVICES  In-house referral  Clinical Social Worker      DC Planning Services  CM consult      [facility] Choice  HOME HEALTH   Choice offered to / List presented to:  C-1 Patient   DME arranged  NA      DME agency  NA     HH arranged  HH-2 PT      [facility] agency  [facility]   Status of service:  Completed, signed off Medicare Important Message given?  NA - LOS <3 / Initial given by admissions (If response is "NO", the following Medicare IM given date fields will be blank) Date Medicare IM given:   Date Additional Medicare IM given:    Discharge Disposition:  HOME W HOME HEALTH SERVICES  Per UR Regulation:    If discussed at Long Length of Stay Meetings, dates discussed:    Comments:  01/07/2012 Colleen Can BSN RN CCM [phone] Genevieve Norlander will start [facility] services today 01/07/2012.

## 2012-01-04 NOTE — Progress Notes (Signed)
   Subjective: 1 Day Post-Op Procedure(s) (LRB): TOTAL HIP ARTHROPLASTY (Right) Patient reports pain as moderate.   Patient seen in rounds without Dr. Darrelyn Hillock. Patient is feeling better today in regards to his right hip but is having problems with numbness in the right foot. He denies having had this before surgery and just   Noticed it last night. He denies numbness and tingling in the left foot or even in the legs. No issues with chest pain or shortness of breath. He reports that he was in a lot of pain last night and therefore did not sleep well but his pain is better controlled today. He walked with therapy this morning and reports that it went well.  Plan is to go Home after hospital stay.  Objective: Vital signs in last 24 hours: Temp:  [97.4 F (36.3 C)-98.9 F (37.2 C)] 98.8 F (37.1 C) (12/31 1040) Pulse Rate:  [56-97] 70  (12/31 1040) Resp:  [12-19] 16  (12/31 1040) BP: (107-159)/(65-97) 111/65 mmHg (12/31 1040) SpO2:  [95 %-100 %] 99 % (12/31 1040) Weight:  [108.41 kg (239 lb)] 108.41 kg (239 lb) (12/30 1649)  Intake/Output from previous day:  Intake/Output Summary (Last 24 hours) at 01/04/12 1219 Last data filed at 01/04/12 0910  Gross per 24 hour  Intake 4491.67 ml  Output   1280 ml  Net 3211.67 ml    Intake/Output this shift: Total I/O In: 240 [P.O.:240] Out: -   Labs:  Basename 01/04/12 0419 01/03/12 1743  HGB 11.1* 13.0    Basename 01/04/12 0419 01/03/12 1743  WBC 15.8* 23.3*  RBC 4.23 4.83  HCT 34.6* 38.8*  PLT 309 310    Basename 01/04/12 0419 01/03/12 1743  NA 137 --  K 4.5 --  CL 103 --  CO2 27 --  BUN 24* --  CREATININE 0.93 0.80  GLUCOSE 176* --  CALCIUM 8.4 --    EXAM General - Patient is Alert and Oriented Extremity - Neurologically intact Dorsiflexion/Plantar flexion intact No cellulitis present Dressing - moderate drainage from hemovac site once hemovac removed Motor Function - intact, moving foot and toes well on exam.    Decreased sensation in the right foot but no motor deficits appreciated on exam.   Past Medical History  Diagnosis Date  . Hypertension   . Coronary artery disease   . Depression   . Hyperlipidemia   . Arthritis   . Sleep apnea     CPAP 'Uses half the time"  last study 2009  . Cancer     skin    Assessment/Plan: 1 Day Post-Op Procedure(s) (LRB): TOTAL HIP ARTHROPLASTY (Right) Active Problems:  Osteoarthritis of right hip  Estimated Body mass index is 34.29 kg/(m^2) as calculated from the following:   Height as of this encounter: 5\' 10" (1.778 m).   Weight as of this encounter: 239 lb(108.41 kg). Advance diet Up with therapy  DVT Prophylaxis - Xarelto and TED hose PWB 50% right leg Hemovac Pulled Begin Therapy Hip Preacutions  He is doing much better today than last night. We will continue to monitor the numbness in the right foot. No motor dysfunction at this point. hemovac site has moderate bloody drainage and will reinforce as tolerated. Will recheck labs in the morning.   Elad Macphail LAUREN 01/04/2012, 12:19 PM

## 2012-01-04 NOTE — Progress Notes (Signed)
Utilization review completed.  

## 2012-01-04 NOTE — Progress Notes (Signed)
01/04/12 1307  PT Visit Information  Last PT Received On 01/04/12  Assistance Needed +1  PT Time Calculation  PT Start Time 1200  PT Stop Time 1248  PT Time Calculation (min) 48 min  Subjective Data  Patient Stated Goal home  Precautions  Precautions Posterior Hip  Precaution Comments sign in room  Restrictions  RLE Weight Bearing PWB  Other Position/Activity Restrictions instructed in hip precautions again  Cognition  Overall Cognitive Status Appears within functional limits for tasks assessed/performed  Arousal/Alertness Awake/alert  Orientation Level Appears intact for tasks assessed  Behavior During Session [facility] for tasks performed  Bed Mobility  Bed Mobility Not assessed  Transfers  Transfers Sit to Stand;Stand to Sit  Sit to Stand 4: Min assist;4: Min guard;From chair/3-in-1  Stand to Sit 4: Min guard;4: Min assist;To chair/3-in-1;With upper extremity assist  Details for Transfer Assistance cues for hand placement and THP, RLE position  Ambulation/Gait  Ambulation/Gait Assistance 4: Min assist;4: Min guard  Ambulation Distance (Feet) 110 Feet  Assistive device Rolling walker  Ambulation/Gait Assistance Details  cues for sequence, RW distance from self and PWB ; LOB x 1 with min to recover;  Gait Pattern Step-to pattern  Gait velocity decreased  General Gait Details pt bleeding from site where hemovac removed; assisted PA in applying pressure dsg and RN notified  Exercises  Exercises Total Joint  Total Joint Exercises  Ankle Circles/Pumps AROM;Both;10 reps  Quad Sets AROM;Both;10 reps  PT - End of Session  Equipment Utilized During Treatment Gait belt  Activity Tolerance Patient tolerated treatment well  Patient left in chair;with call bell/phone within reach;with family/visitor present  Nurse Communication Other (comment) (bleeding; )  PT - Assessment/Plan  Comments on Treatment Session further ther ex deferred due to bleeding; pt  continues to be concernedabout  numbness R foot  PT Plan Discharge plan remains appropriate;Frequency remains appropriate  PT Frequency 7X/week  Follow Up Recommendations Home health PT  PT equipment None recommended by PT  Acute Rehab PT Goals  Time For Goal Achievement 01/11/12  Potential to Achieve Goals Good  Pt will go Sit to Stand with supervision  PT Goal: Sit to Stand - Progress Progressing toward goal  Pt will go Stand to Sit with supervision  PT Goal: Stand to Sit - Progress Progressing toward goal  Pt will Ambulate 51 - 150 feet;with supervision;with rolling walker  PT Goal: Ambulate - Progress Progressing toward goal  PT General Charges  $$ ACUTE PT VISIT 1 Procedure  PT Treatments  $Gait Training 23-37 mins  $Therapeutic Activity 8-22 mins

## 2012-01-04 NOTE — Evaluation (Signed)
Occupational Therapy Evaluation Patient Details Name: Gary [name] MRN: [id number] DOB: 06/29/1945 Today's Date: 01/04/2012 Time: 5621-3086 OT Time Calculation (min): 30 min  OT Assessment / Plan / Recommendation Clinical Impression  This 66 year old man was admitted for posterior approach R THA.  He has THPs and is PWB.  He will benefit from skilled OT to increase safety and independence with adls to reach a supervision to min guard level in acute.      OT Assessment  Patient needs continued OT Services    Follow Up Recommendations  Home health OT    Barriers to Discharge      Equipment Recommendations  None recommended by OT (has 3:1 commode)    Recommendations for Other Services    Frequency  Min 2X/week    Precautions / Restrictions Precautions Precautions: Posterior Hip Precaution Comments: sign in room Restrictions RLE Weight Bearing: Partial weight bearing   Pertinent Vitals/Pain R hip sore    ADL  Grooming: Simulated;Set up Where Assessed - Grooming: Supported sitting Upper Body Bathing: Simulated;Set up Where Assessed - Upper Body Bathing: Supported sitting Lower Body Bathing: Simulated;Minimal assistance (with AE) Where Assessed - Lower Body Bathing: Supported sit to stand Upper Body Dressing: Simulated;Set up Where Assessed - Upper Body Dressing: Supported sitting Lower Body Dressing: Simulated;Moderate assistance (performed one sock with sock aide; has all other AE) Where Assessed - Lower Body Dressing: Supported sit to stand Toileting - Architect and Hygiene: Simulated;Minimal assistance Where Assessed - Engineer, mining and Hygiene: Sit to stand from 3-in-1 or toilet Equipment Used: Sock aid;Reacher;Rolling walker Transfers/Ambulation Related to ADLs: Performed sit to stand and stepped 2 steps forward and backwards to reinforce sequence.   ADL Comments: Pt has all AE except for sock aid at home.  Educated on ADLs with THPs      OT Diagnosis: Generalized weakness  OT Problem List: Decreased strength;Decreased activity tolerance;Decreased knowledge of use of DME or AE;Pain;Decreased knowledge of precautions OT Treatment Interventions: Self-care/ADL training;DME and/or AE instruction;Therapeutic activities;Patient/family education   OT Goals Acute Rehab OT Goals OT Goal Formulation: With patient Time For Goal Achievement: 01/11/12 Potential to Achieve Goals: Good ADL Goals Pt Will Perform Lower Body Bathing: with supervision;Sit to stand from chair;with adaptive equipment ADL Goal: Lower Body Bathing - Progress: Goal set today Pt Will Perform Lower Body Dressing: with min assist;Sit to stand from chair;with adaptive equipment ADL Goal: Lower Body Dressing - Progress: Goal set today Pt Will Transfer to Toilet: with min assist;Ambulation;3-in-1;Maintaining hip precautions (min guard) ADL Goal: Toilet Transfer - Progress: Goal set today Pt Will Perform Toileting - Hygiene: with supervision;Standing at 3-in-1/toilet ADL Goal: Toileting - Hygiene - Progress: Goal set today Pt Will Perform Tub/Shower Transfer: Shower transfer;with min assist;Ambulation;Shower seat with back (min guard) ADL Goal: Tub/Shower Transfer - Progress: Goal set today  Visit Information  Last OT Received On: 01/04/12 Assistance Needed: +1    Subjective Data  Subjective: Let me ask you this.Marland KitchenMarland KitchenMarland KitchenI don't want to dislocate this! Patient Stated Goal: none stated   Prior Functioning     Home Living Lives With: Spouse Available Help at Discharge: Family Type of Home: House Home Access: [name] to enter [employer] of Steps: 2 Home Layout: Two level;Able to live on main level with bedroom/bathroom Bathroom Shower/Tub: Health visitor: Standard (with 3:1) Home Adaptive Equipment: Walker - rolling;Bedside commode/3-in-1 Prior Function Level of Independence: Independent with assistive device(s) Able to Take  [name]?: Yes Driving: Yes Communication Communication: No  difficulties         Vision/Perception     Cognition  Overall Cognitive Status: Appears within functional limits for tasks assessed/performed Arousal/Alertness: Awake/alert Orientation Level: Appears intact for tasks assessed Behavior During Session: [name] for tasks performed    Extremity/Trunk Assessment Right Upper Extremity Assessment RUE ROM/Strength/Tone: [name] for tasks assessed Left Upper Extremity Assessment LUE ROM/Strength/Tone: [name] for tasks assessed Right Lower Extremity Assessment RLE ROM/Strength/Tone: Deficits;Unable to fully assess;Due to pain RLE ROM/Strength/Tone Deficits: ankle AROM WFL, AAROM hip and WFL within precautions see below for deficits RLE Sensation: Deficits RLE Sensation Deficits: intact to light touch,  but states that leg feels "funny, numb and tingly";  Left Lower Extremity Assessment LLE ROM/Strength/Tone: [name] for tasks assessed     Mobility Bed Mobility Bed Mobility: Not assessed Supine to Sit: 4: Min assist Details for Bed Mobility Assistance: cues for technique, THP Transfers Sit to Stand: 4: Min assist;From bed Stand to Sit: 4: Min assist;To chair/3-in-1 Details for Transfer Assistance: min cues for UE/LE placement     Shoulder Instructions     Exercise    Balance     End of Session OT - End of Session Activity Tolerance: Patient tolerated treatment well Patient left: in chair;with call bell/phone within reach  GO     Harinder Romas 01/04/2012, 1:09 PM Marica Otter, OTR/L [phone] 01/04/2012

## 2012-01-05 LAB — CBC
HCT: 28.4 % — ABNORMAL LOW (ref 39.0–52.0)
Hemoglobin: 9.3 g/dL — ABNORMAL LOW (ref 13.0–17.0)
MCHC: 32.7 g/dL (ref 30.0–36.0)
RDW: 15.5 % (ref 11.5–15.5)
WBC: 12 10*3/uL — ABNORMAL HIGH (ref 4.0–10.5)

## 2012-01-05 LAB — BASIC METABOLIC PANEL
Chloride: 95 mEq/L — ABNORMAL LOW (ref 96–112)
GFR calc Af Amer: 71 mL/min — ABNORMAL LOW (ref >90)
GFR calc non Af Amer: 61 mL/min — ABNORMAL LOW (ref >90)
Glucose, Bld: 136 mg/dL — ABNORMAL HIGH (ref 70–99)
Potassium: 4.7 mEq/L (ref 3.5–5.1)
Sodium: 131 mEq/L — ABNORMAL LOW (ref 135–145)

## 2012-01-05 MED ORDER — POLYETHYLENE GLYCOL 3350 17 G PO PACK
17.0000 g | PACK | Freq: Every day | ORAL | Status: DC
  Administered 2012-01-05 – 2012-01-06 (×2): 17 g via ORAL

## 2012-01-05 NOTE — Progress Notes (Signed)
Physical Therapy Treatment Patient Details Name: Gary [name] MRN: [id number] DOB: [birth year] Today's Date: 01/05/2012 Time: 1100-1115 PT Time Calculation (min): 15 min  PT Assessment / Plan / Recommendation Comments on Treatment Session  Progressing well.  Will plan to practice steps during pm session. Continues to report R dorsal foot numbness-great toe, 2nd toe and up forefoot. Plan is for d/c home tomorrow.     Follow Up Recommendations  Home health PT     Does the patient have the potential to tolerate intense rehabilitation     Barriers to Discharge        Equipment Recommendations  None recommended by PT    Recommendations for Other Services    Frequency 7X/week   Plan Discharge plan remains appropriate    Precautions / Restrictions Precautions Precautions: Posterior Hip Precaution Comments: Verbally reviewed hip precautions. Pt able to recall 2/3 Restrictions Weight Bearing Restrictions: No RLE Weight Bearing: Partial weight bearing RLE Partial Weight Bearing Percentage or Pounds: 50%   Pertinent Vitals/Pain R hip-unrated    Mobility  Bed Mobility Bed Mobility: Not assessed Details for Bed Mobility Assistance: pt sitting in recliner Transfers Transfers: Sit to Stand;Stand to Sit Sit to Stand: 4: Min guard;From chair/3-in-1;With upper extremity assist;With armrests Stand to Sit: 4: Min guard;To chair/3-in-1;With armrests;With upper extremity assist Details for Transfer Assistance: VCs safety, technique, hand/R LE placement.  Ambulation/Gait Ambulation/Gait Assistance: 4: Min guard Ambulation Distance (Feet): 115 Feet Assistive device: Rolling walker Ambulation/Gait Assistance Details: VCs safety, technique, sequence, adherence to PWB. 1 short standing rest break midway through ambulation distance.  Gait Pattern: Step-to pattern;Decreased stride length;Decreased step length - right    Exercises Total Joint Exercises Ankle Circles/Pumps: AROM;Both;10  reps;Supine Quad Sets: AROM;Both;10 reps;Supine Short Arc Quad: AROM;Right;10 reps;Supine Heel Slides: AAROM;Right;10 reps;Supine Hip ABduction/ADduction: AAROM;Right;Supine   PT Diagnosis:    PT Problem List:   PT Treatment Interventions:     PT Goals Acute Rehab PT Goals Pt will go Sit to Stand: with supervision PT Goal: Sit to Stand - Progress: Progressing toward goal Pt will go Stand to Sit: with supervision PT Goal: Stand to Sit - Progress: Progressing toward goal Pt will Ambulate: 51 - 150 feet;with supervision;with rolling walker PT Goal: Ambulate - Progress: Progressing toward goal  Visit Information  Last PT Received On: 01/05/12 Assistance Needed: +1    Subjective Data  Subjective: "That foot is still numb" Patient Stated Goal: home tomorrow   Cognition  Overall Cognitive Status: Appears within functional limits for tasks assessed/performed Arousal/Alertness: Awake/alert Orientation Level: Appears intact for tasks assessed Behavior During Session: [facility] for tasks performed    Balance     End of Session PT - End of Session Equipment Utilized During Treatment: Gait belt Activity Tolerance: Patient tolerated treatment well Patient left: in chair;with call bell/phone within reach;with family/visitor present   GP     Rebeca Alert [facility] 01/05/2012, 12:10 PM [phone]

## 2012-01-05 NOTE — Progress Notes (Signed)
Physical Therapy Treatment Patient Details Name: Gary [name] MRN: [id number] DOB: [birth year] Today's Date: 01/05/2012 Time: 3086-5784 PT Time Calculation (min): 24 min  PT Assessment / Plan / Recommendation Comments on Treatment Session  Progressing well. Practiced steps. Plan is for d/c home tomorrow.     Follow Up Recommendations  Home health PT     Does the patient have the potential to tolerate intense rehabilitation     Barriers to Discharge        Equipment Recommendations  None recommended by PT    Recommendations for Other Services    Frequency 7X/week   Plan Discharge plan remains appropriate    Precautions / Restrictions Precautions Precautions: Posterior Hip Restrictions Weight Bearing Restrictions: No RLE Weight Bearing: Partial weight bearing RLE Partial Weight Bearing Percentage or Pounds: 50%   Pertinent Vitals/Pain "sore"-R hip    Mobility  Bed Mobility Bed Mobility: Sit to Supine Sit to Supine: 4: Min assist Details for Bed Mobility Assistance: Assist for R LE onto bed. VCs safety.  Transfers Transfers: Sit to Stand;Stand to Sit Sit to Stand: 4: Min guard;From chair/3-in-1 Stand to Sit: To bed Details for Transfer Assistance: VCS safety, technique, hand/ R LE placement.  Ambulation/Gait Ambulation/Gait Assistance: 4: Min guard Ambulation Distance (Feet): 115 Feet Assistive device: Rolling walker Ambulation/Gait Assistance Details: VCs safety, technique, sequence, adherence to PWB. 1 short standing rest break midway through ambulation distance Stairs: Yes Stairs Assistance: 4: Min assist Stairs Assistance Details (indicate cue type and reason): Practiced steps-once with RW, once sideways with 1 rail. Pt states he has a post to hold onto to simulate rail.  Stair Management Technique: Backwards;Sideways;One rail Right;With walker Number of Stairs: 3     Exercises     PT Diagnosis:    PT Problem List:   PT Treatment Interventions:     PT  Goals Acute Rehab PT Goals Pt will go Sit to Supine/Side: with supervision PT Goal: Sit to Supine/Side - Progress: Progressing toward goal Pt will go Sit to Stand: with supervision PT Goal: Sit to Stand - Progress: Progressing toward goal Pt will go Stand to Sit: with supervision PT Goal: Stand to Sit - Progress: Progressing toward goal Pt will Ambulate: 51 - 150 feet;with supervision;with rolling walker PT Goal: Ambulate - Progress: Progressing toward goal Pt will Go Up / Down Stairs: 1-2 stairs;with min assist;with least restrictive assistive device PT Goal: Up/Down Stairs - Progress: Progressing toward goal  Visit Information  Last PT Received On: 01/05/12 Assistance Needed: +1    Subjective Data  Subjective: "I haven't been able to get in the bed" Patient Stated Goal: home   Cognition  Overall Cognitive Status: Appears within functional limits for tasks assessed/performed Arousal/Alertness: Awake/alert Orientation Level: Appears intact for tasks assessed Behavior During Session: [name] for tasks performed    Balance     End of Session PT - End of Session Equipment Utilized During Treatment: Gait belt Activity Tolerance: Patient tolerated treatment well Patient left: in bed;with call bell/phone within reach;with family/visitor present   GP     [name] 01/05/2012, 4:21 PM [phone]

## 2012-01-05 NOTE — Progress Notes (Signed)
Subjective: 2 Days Post-Op Procedure(s) (LRB): TOTAL HIP ARTHROPLASTY (Right) Patient reports pain as 4 on 0-10 scale.    Objective: Vital signs in last 24 hours: Temp:  [98 F (36.7 C)-99.1 F (37.3 C)] 99.1 F (37.3 C) (01/01 0515) Pulse Rate:  [55-70] 64  (01/01 0515) Resp:  [16-18] 18  (01/01 0732) BP: (110-138)/(65-77) 138/77 mmHg (01/01 0515) SpO2:  [95 %-99 %] 97 % (01/01 0515)  Intake/Output from previous day: 12/31 0701 - 01/01 0700 In: 1917 [P.O.:1200; I.V.:660; IV Piggyback:57] Out: 675 [Urine:675] Intake/Output this shift:     Basename 01/05/12 0505 01/04/12 0419 01/03/12 1743  HGB 9.3* 11.1* 13.0    Basename 01/05/12 0505 01/04/12 0419  WBC 12.0* 15.8*  RBC 3.49* 4.23  HCT 28.4* 34.6*  PLT 285 309    Basename 01/05/12 0505 01/04/12 0419  NA 131* 137  K 4.7 4.5  CL 95* 103  CO2 30 27  BUN 31* 24*  CREATININE 1.19 0.93  GLUCOSE 136* 176*  CALCIUM 8.9 8.4   No results found for this basename: LABPT:2,INR:2 in the last 72 hours  Compartment soft  Assessment/Plan: 2 Days Post-Op Procedure(s) (LRB): TOTAL HIP ARTHROPLASTY (Right) Up with therapy.continues to have slight weakness and decrease sensation over great toe of right foot. Prednisone dosepak started.Plan to Dc Thursday.  Jaquez Farrington A 01/05/2012, 8:23 AM

## 2012-01-06 LAB — CBC
HCT: 25.3 % — ABNORMAL LOW (ref 39.0–52.0)
Hemoglobin: 8.4 g/dL — ABNORMAL LOW (ref 13.0–17.0)
RBC: 3.14 MIL/uL — ABNORMAL LOW (ref 4.22–5.81)

## 2012-01-06 MED ORDER — METHOCARBAMOL 500 MG PO TABS: 500.0000 mg | ORAL_TABLET | Freq: Four times a day (QID) | ORAL | Status: DC | PRN

## 2012-01-06 MED ORDER — RIVAROXABAN 10 MG PO TABS: 10.0000 mg | ORAL_TABLET | Freq: Every day | ORAL | Status: DC

## 2012-01-06 MED ORDER — POLYETHYLENE GLYCOL 3350 17 G PO PACK: 17.0000 g | PACK | Freq: Every day | ORAL | Status: DC

## 2012-01-06 MED ORDER — FERROUS SULFATE 325 (65 FE) MG PO TABS: 325.0000 mg | ORAL_TABLET | Freq: Three times a day (TID) | ORAL | Status: DC

## 2012-01-06 MED ORDER — PREDNISONE (PAK) 5 MG PO TABS: 5.0000 mg | ORAL_TABLET | Freq: Four times a day (QID) | ORAL | Status: DC

## 2012-01-06 MED ORDER — OXYCODONE-ACETAMINOPHEN 5-325 MG PO TABS: 1.0000 | ORAL_TABLET | ORAL | Status: DC | PRN

## 2012-01-06 MED ORDER — BISACODYL 10 MG RE SUPP
10.0000 mg | Freq: Once | RECTAL | Status: AC
  Administered 2012-01-06: 10 mg via RECTAL
  Filled 2012-01-06: qty 1

## 2012-01-06 NOTE — Progress Notes (Signed)
Subjective: 3 Days Post-Op Procedure(s) (LRB): TOTAL HIP ARTHROPLASTY (Right) Patient reports pain as 1 on 0-10 scale. Much better in regards to dorsiflexion in right foot. Still has numbness in first web space of right foot. No sciatica today. Ambulating with walker .Hbg 8.4.   Objective: Vital signs in last 24 hours: Temp:  [97.6 F (36.4 C)-99 F (37.2 C)] 98.2 F (36.8 C) (01/02 4540) Pulse Rate:  [70-81] 70  (01/02 0614) Resp:  [16-20] 17  (01/02 0614) BP: (116-130)/(63-72) 130/70 mmHg (01/02 0614) SpO2:  [96 %-98 %] 97 % (01/02 0614)  Intake/Output from previous day: 01/01 0701 - 01/02 0700 In: 720 [P.O.:720] Out: 1550 [Urine:1550] Intake/Output this shift:     Basename 01/06/12 0405 01/05/12 0505 01/04/12 0419 01/03/12 1743  HGB 8.4* 9.3* 11.1* 13.0    Basename 01/06/12 0405 01/05/12 0505  WBC 12.0* 12.0*  RBC 3.14* 3.49*  HCT 25.3* 28.4*  PLT 287 285    Basename 01/05/12 0505 01/04/12 0419  NA 131* 137  K 4.7 4.5  CL 95* 103  CO2 30 27  BUN 31* 24*  CREATININE 1.19 0.93  GLUCOSE 136* 176*  CALCIUM 8.9 8.4   No results found for this basename: LABPT:2,INR:2 in the last 72 hours  Dorsiflexion/Plantar flexion intact No cellulitis present Compartment soft  Assessment/Plan: 3 Days Post-Op Procedure(s) (LRB): TOTAL HIP ARTHROPLASTY (Right) Discharge home with home health  Gary [name] A 01/06/2012, 7:10 AM

## 2012-01-06 NOTE — Progress Notes (Signed)
Physical Therapy Treatment Patient Details Name: Gary [name] MRN: [id number] DOB: 1945/06/27 Today's Date: 01/06/2012 Time: 1191-4782 PT Time Calculation (min): 38 min  PT Assessment / Plan / Recommendation Comments on Treatment Session  Reviewed car transfers with pt and spouse.  Pt and spouse with multiple small question/concerns - answerred and addressed    Follow Up Recommendations  Home health PT     Does the patient have the potential to tolerate intense rehabilitation     Barriers to Discharge        Equipment Recommendations  None recommended by PT    Recommendations for Other Services    Frequency 7X/week   Plan Discharge plan remains appropriate    Precautions / Restrictions Precautions Precautions: Posterior Hip Precaution Comments: reviewed THPs with pt and spouse Restrictions Weight Bearing Restrictions: No RLE Weight Bearing: Partial weight bearing RLE Partial Weight Bearing Percentage or Pounds: 50%   Pertinent Vitals/Pain Min c/o pain - "its just sore"    Mobility  Bed Mobility Bed Mobility: Supine to Sit;Sit to Supine Supine to Sit: 4: Min guard Sit to Supine: 4: Min guard Details for Bed Mobility Assistance: cues for sequence and use of L LE - guard to insure adherence to THP Transfers Transfers: Sit to Stand;Stand to Sit Sit to Stand: 4: Min guard;With upper extremity assist;From bed Stand to Sit: 4: Min guard;With upper extremity assist;To chair/3-in-1;To bed Details for Transfer Assistance: verbal cues for THPs and hand placement Ambulation/Gait Ambulation/Gait Assistance: 4: Min guard;5: Supervision Ambulation Distance (Feet): 144 Feet Assistive device: Rolling walker Ambulation/Gait Assistance Details: cues for posture, position from RW, ER on R and adherence to [facility] Gait Pattern: Step-to pattern Stairs:  (pt states he is comfortable with ability after session yeste)    Exercises Total Joint Exercises Ankle Circles/Pumps:  AROM;Both;Supine;20 reps Quad Sets: AROM;Both;10 reps;Supine Gluteal Sets: AROM;10 reps;Supine;Both Heel Slides: AAROM;Right;Supine;20 reps Hip ABduction/ADduction: AAROM;Right;Supine;20 reps Long Arc Quad: AROM;AAROM;20 reps;Supine;Both   PT Diagnosis:    PT Problem List:   PT Treatment Interventions:     PT Goals Acute Rehab PT Goals PT Goal Formulation: With patient Time For Goal Achievement: 01/11/12 Potential to Achieve Goals: Good Pt will go Supine/Side to Sit: with supervision PT Goal: Supine/Side to Sit - Progress: Progressing toward goal Pt will go Sit to Supine/Side: with supervision PT Goal: Sit to Supine/Side - Progress: Progressing toward goal Pt will go Sit to Stand: with supervision PT Goal: Sit to Stand - Progress: Met Pt will go Stand to Sit: with supervision PT Goal: Stand to Sit - Progress: Met Pt will Ambulate: 51 - 150 feet;with supervision;with rolling walker PT Goal: Ambulate - Progress: Met Pt will Go Up / Down Stairs: 1-2 stairs;with min assist;with least restrictive assistive device PT Goal: Up/Down Stairs - Progress: Met  Visit Information  Last PT Received On: 01/06/12 Assistance Needed: +1    Subjective Data  Subjective: I'm going home today.  I have some questions........ Patient Stated Goal: home   Cognition  Overall Cognitive Status: Appears within functional limits for tasks assessed/performed Arousal/Alertness: Awake/alert Orientation Level: Appears intact for tasks assessed Behavior During Session: [facility] for tasks performed Cognition - Other Comments: Pt mildly impulsive    Balance  Balance Balance Assessed: Yes Dynamic Standing Balance Dynamic Standing - Level of Assistance: 5: Stand by assistance  End of Session PT - End of Session Activity Tolerance: Patient tolerated treatment well Patient left: in bed;with call bell/phone within reach;with family/visitor present Nurse Communication: Mobility status  GP      Rabecka Brendel 01/06/2012, 12:37 PM

## 2012-01-06 NOTE — Progress Notes (Signed)
Occupational Therapy Treatment Patient Details Name: Gary [name] MRN: [id number] DOB: [birth year] Today's Date: 01/06/2012 Time: 4098-1191 OT Time Calculation (min): 37 min  OT Assessment / Plan / Recommendation Comments on Treatment Session Pt did well overall with session but could benefit from [facility] to reinforce hip precautions and safety with ADL.    Follow Up Recommendations  Home health OT;Supervision/Assistance - 24 hour    Barriers to Discharge       Equipment Recommendations  None recommended by OT    Recommendations for Other Services    Frequency Min 2X/week   Plan Discharge plan remains appropriate    Precautions / Restrictions Precautions Precautions: Posterior Hip Precaution Comments: reviewed THPs with pt and spouse Restrictions Weight Bearing Restrictions: No RLE Weight Bearing: Partial weight bearing RLE Partial Weight Bearing Percentage or Pounds: 50%        ADL  Lower Body Dressing: Performed;Maximal assistance;Other (comment) (see notes below) Where Assessed - Lower Body Dressing: Supported sit to stand Tub/Shower Transfer: Performed;Minimal assistance Equipment Used: Rolling walker;Long-handled shoe horn;Long-handled sponge;Reacher ADL Comments: Pt donned boxers and jeans with min assist and reacher with mod verbal cues to not break THPs especially 90 degrees at hip. He had difficult with donning tennis shoe on R foot due to dificulty lifting R foot off the floor and possibly some edema in the foot. Tried bedroom slippers with back on them but pt still having difficulty getting R foot in slipper so max assist for shoes overall. He states he doesnt plan to wear socks at home. he has a longer shoe horn to use at home. Wife states she can help with donning shoes/slippers until pt able. Pt asking about shower chair. He has a metal folding chair but as pt describes chair, do not feel this is a safe option for pt. Explained 3in1 can be used as a shower chair but  pt's wife has had a somewhat recent wrist fracture and is still in a splint. Advised  pt and wife that she shouldnt lift 3in1 to place in shower. Pt states his son can come and help with showering and moving 3in1 as needed. Also discussed possbility of purchasing a separate shower chair so they dont have to worry about switching it from toilet to shower. They state they may decide to purchase one on their own. Pt is slightly impulsive at times and needs cues for safety with RW and to adhere to THPs.     OT Diagnosis:    OT Problem List:   OT Treatment Interventions:     OT Goals ADL Goals ADL Goal: Lower Body Dressing - Progress: Not progressing (difficulty with shoes; did well with pants.) ADL Goal: Tub/Shower Transfer - Progress: Progressing toward goals  Visit Information  Last OT Received On: 01/06/12 Assistance Needed: +1    Subjective Data  Subjective: I do have all the pieces except a sock aid Patient Stated Goal: none stated. agreeable to work on getting dressed   Prior Functioning       Cognition  Overall Cognitive Status: Appears within functional limits for tasks assessed/performed Arousal/Alertness: Awake/alert Orientation Level: Appears intact for tasks assessed Behavior During Session: [facility] for tasks performed    Mobility  Shoulder Instructions Transfers Transfers: Sit to Stand;Stand to Sit Sit to Stand: 4: Min guard;With upper extremity assist;From bed Stand to Sit: 4: Min guard;With upper extremity assist;To chair/3-in-1;To bed Details for Transfer Assistance: verbal cues for THPs and hand placement       Exercises  Balance Balance Balance Assessed: Yes Dynamic Standing Balance Dynamic Standing - Level of Assistance: 5: Stand by assistance   End of Session OT - End of Session Activity Tolerance: Patient tolerated treatment well Patient left: in chair;with call bell/phone within reach;with family/visitor present  GO     Lennox Laity [phone] 01/06/2012, 11:49 AM

## 2012-01-07 DIAGNOSIS — F329 Major depressive disorder, single episode, unspecified: Secondary | ICD-10-CM | POA: Diagnosis not present

## 2012-01-07 DIAGNOSIS — Z96649 Presence of unspecified artificial hip joint: Secondary | ICD-10-CM | POA: Diagnosis not present

## 2012-01-07 DIAGNOSIS — IMO0001 Reserved for inherently not codable concepts without codable children: Secondary | ICD-10-CM | POA: Diagnosis not present

## 2012-01-07 DIAGNOSIS — F3289 Other specified depressive episodes: Secondary | ICD-10-CM | POA: Diagnosis not present

## 2012-01-07 DIAGNOSIS — Z7901 Long term (current) use of anticoagulants: Secondary | ICD-10-CM | POA: Diagnosis not present

## 2012-01-07 DIAGNOSIS — Z471 Aftercare following joint replacement surgery: Secondary | ICD-10-CM | POA: Diagnosis not present

## 2012-01-10 DIAGNOSIS — Z7901 Long term (current) use of anticoagulants: Secondary | ICD-10-CM | POA: Diagnosis not present

## 2012-01-10 DIAGNOSIS — Z96649 Presence of unspecified artificial hip joint: Secondary | ICD-10-CM | POA: Diagnosis not present

## 2012-01-10 DIAGNOSIS — Z471 Aftercare following joint replacement surgery: Secondary | ICD-10-CM | POA: Diagnosis not present

## 2012-01-10 DIAGNOSIS — F329 Major depressive disorder, single episode, unspecified: Secondary | ICD-10-CM | POA: Diagnosis not present

## 2012-01-10 DIAGNOSIS — F3289 Other specified depressive episodes: Secondary | ICD-10-CM | POA: Diagnosis not present

## 2012-01-10 DIAGNOSIS — IMO0001 Reserved for inherently not codable concepts without codable children: Secondary | ICD-10-CM | POA: Diagnosis not present

## 2012-01-10 NOTE — Discharge Summary (Signed)
Physician Discharge Summary   Patient ID: RHEN KAWECKI MRN: [id number] DOB/AGE: 1945/05/24 67 y.o.  Admit date: 01/03/2012 Discharge date: 01/06/2012  Primary Diagnosis: Osteoarthritis, right hip  Admission Diagnoses:  Past Medical History  Diagnosis Date  . Hypertension   . Coronary artery disease   . Depression   . Hyperlipidemia   . Arthritis   . Sleep apnea     CPAP 'Uses half the time"  last study 2009  . 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   Weight as of this encounter: 239 lb(108.41 kg).  Classification of overweight in adults according to BMI (WHO, 1998)   Procedure: Procedure(s) (LRB): TOTAL HIP ARTHROPLASTY (Right)   Consults: None  HPI: [name], 67 y.o. male, has a history of pain and functional disability in the right hip(s) due to arthritis and patient has failed non-surgical conservative treatments for greater than 12 weeks to include NSAID's and/or analgesics, use of assistive devices and activity modification. Onset of symptoms was gradual starting 3 years ago with gradually worsening course since that time.The patient noted no past surgery on the right hip(s). Patient currently rates pain in the right hip at 8 out of 10 with activity. Patient has night pain, worsening of pain with activity and weight bearing, pain that interfers with activities of daily living and pain with passive range of motion. Patient has evidence of periarticular osteophytes, joint subluxation and joint space narrowing by imaging studies. This condition presents safety issues increasing the risk of falls. There is no current active infection.  Laboratory Data: Admission on 01/03/2012, Discharged on 01/06/2012  Component Date Value Range Status  . ABO/RH(D)  01/03/2012 O NEG   Final  . Antibody Screen 01/03/2012 NEG   Final  . Sample Expiration 01/03/2012 01/06/2012   Final  . ABO/RH(D) 01/03/2012 O NEG   Final  . WBC 01/04/2012 15.8* 4.0 - 10.5 K/uL Final  . RBC 01/04/2012 4.23  4.22 - 5.81 MIL/uL Final  . Hemoglobin 01/04/2012 11.1* 13.0 - 17.0 g/dL Final  . HCT 40/98/1191 34.6* 39.0 - 52.0 % Final  . MCV 01/04/2012 81.8  78.0 - 100.0 fL Final  . MCH 01/04/2012 26.2  26.0 - 34.0 pg Final  . MCHC 01/04/2012 32.1  30.0 - 36.0 g/dL Final  . RDW 47/82/9562 15.3  11.5 - 15.5 % Final  . Platelets 01/04/2012 309  150 - 400 K/uL Final  . Sodium 01/04/2012 137  135 - 145 mEq/L Final  . Potassium 01/04/2012 4.5  3.5 - 5.1 mEq/L Final  . Chloride 01/04/2012 103  96 - 112 mEq/L Final  . CO2 01/04/2012 27  19 - 32 mEq/L Final  . Glucose, Bld 01/04/2012 176* 70 - 99 mg/dL Final  . BUN 13/08/6576 24* 6 - 23 mg/dL Final  . Creatinine, Ser 01/04/2012 0.93  0.50 - 1.35 mg/dL Final  . Calcium 46/96/2952 8.4  8.4 - 10.5 mg/dL Final  . GFR calc non Af Amer 01/04/2012 84* >90 mL/min Final  . GFR calc Af Amer 01/04/2012 >90  >90 mL/min Final   Comment:  The eGFR has been calculated                          using the CKD EPI equation.                          This calculation has not been                          validated in all clinical                          situations.                          eGFR's persistently                          <90 mL/min signify                          possible Chronic Kidney Disease.  . WBC 01/03/2012 23.3* 4.0 - 10.5 K/uL Final  . RBC 01/03/2012 4.83  4.22 - 5.81 MIL/uL Final  . Hemoglobin 01/03/2012 13.0  13.0 - 17.0 g/dL Final  . HCT 78/29/5621 38.8* 39.0 - 52.0 % Final  . MCV 01/03/2012 80.3  78.0 - 100.0 fL Final  . MCH 01/03/2012 26.9  26.0 - 34.0 pg Final  . MCHC 01/03/2012 33.5  30.0 - 36.0 g/dL Final  . RDW 30/86/5784 15.0  11.5 - 15.5 % Final  . Platelets 01/03/2012 310  150 -  400 K/uL Final  . Creatinine, Ser 01/03/2012 0.80  0.50 - 1.35 mg/dL Final  . GFR calc non Af Amer 01/03/2012 90* >90 mL/min Final  . GFR calc Af Amer 01/03/2012 >90  >90 mL/min Final   Comment:                                 The eGFR has been calculated                          using the CKD EPI equation.                          This calculation has not been                          validated in all clinical                          situations.                          eGFR's persistently                          <90 mL/min signify                          possible Chronic Kidney Disease.  . WBC 01/05/2012 12.0* 4.0 - 10.5 K/uL Final  . RBC 01/05/2012 3.49* 4.22 - 5.81 MIL/uL Final  . Hemoglobin  01/05/2012 9.3* 13.0 - 17.0 g/dL Final  . HCT 45/40/9811 28.4* 39.0 - 52.0 % Final  . MCV 01/05/2012 81.4  78.0 - 100.0 fL Final  . MCH 01/05/2012 26.6  26.0 - 34.0 pg Final  . MCHC 01/05/2012 32.7  30.0 - 36.0 g/dL Final  . RDW 91/47/8295 15.5  11.5 - 15.5 % Final  . Platelets 01/05/2012 285  150 - 400 K/uL Final  . Sodium 01/05/2012 131* 135 - 145 mEq/L Final  . Potassium 01/05/2012 4.7  3.5 - 5.1 mEq/L Final  . Chloride 01/05/2012 95* 96 - 112 mEq/L Final   DELTA CHECK NOTED  . CO2 01/05/2012 30  19 - 32 mEq/L Final  . Glucose, Bld 01/05/2012 136* 70 - 99 mg/dL Final  . BUN 62/13/0865 31* 6 - 23 mg/dL Final  . Creatinine, Ser 01/05/2012 1.19  0.50 - 1.35 mg/dL Final  . Calcium 78/46/9629 8.9  8.4 - 10.5 mg/dL Final  . GFR calc non Af Amer 01/05/2012 61* >90 mL/min Final  . GFR calc Af Amer 01/05/2012 71* >90 mL/min Final   Comment:                                 The eGFR has been calculated                          using the CKD EPI equation.                          This calculation has not been                          validated in all clinical                          situations.                          eGFR's persistently                          <90 mL/min signify                           possible Chronic Kidney Disease.  . WBC 01/06/2012 12.0* 4.0 - 10.5 K/uL Final  . RBC 01/06/2012 3.14* 4.22 - 5.81 MIL/uL Final  . Hemoglobin 01/06/2012 8.4* 13.0 - 17.0 g/dL Final  . HCT 52/84/1324 25.3* 39.0 - 52.0 % Final  . MCV 01/06/2012 80.6  78.0 - 100.0 fL Final  . MCH 01/06/2012 26.8  26.0 - 34.0 pg Final  . MCHC 01/06/2012 33.2  30.0 - 36.0 g/dL Final  . RDW 40/10/2723 15.5  11.5 - 15.5 % Final  . Platelets 01/06/2012 287  150 - 400 K/uL Final  Hospital Outpatient Visit on 12/24/2011  Component Date Value Range Status  . aPTT 12/24/2011 34  24 - 37 seconds Final  . Sodium 12/24/2011 136  135 - 145 mEq/L Final  . Potassium 12/24/2011 4.4  3.5 - 5.1 mEq/L Final  . Chloride 12/24/2011 99  96 - 112 mEq/L Final  . CO2 12/24/2011 30  19 - 32 mEq/L Final  . Glucose, Bld 12/24/2011 116* 70 - 99 mg/dL Final  .  BUN 12/24/2011 13  6 - 23 mg/dL Final  . Creatinine, Ser 12/24/2011 0.69  0.50 - 1.35 mg/dL Final  . Calcium 16/10/9602 9.1  8.4 - 10.5 mg/dL Final  . Total Protein 12/24/2011 7.3  6.0 - 8.3 g/dL Final  . Albumin 54/09/8117 3.9  3.5 - 5.2 g/dL Final  . AST 14/78/2956 17  0 - 37 U/L Final  . ALT 12/24/2011 24  0 - 53 U/L Final  . Alkaline Phosphatase 12/24/2011 86  39 - 117 U/L Final  . Total Bilirubin 12/24/2011 0.3  0.3 - 1.2 mg/dL Final  . GFR calc non Af Amer 12/24/2011 >90  >90 mL/min Final  . GFR calc Af Amer 12/24/2011 >90  >90 mL/min Final   Comment:                                 The eGFR has been calculated                          using the CKD EPI equation.                          This calculation has not been                          validated in all clinical                          situations.                          eGFR's persistently                          <90 mL/min signify                          possible Chronic Kidney Disease.  Marland Kitchen Prothrombin Time 12/24/2011 12.2  11.6 - 15.2 seconds Final  . INR 12/24/2011 0.91  0.00 - 1.49  Final  . Color, Urine 12/24/2011 YELLOW  YELLOW Final  . APPearance 12/24/2011 CLEAR  CLEAR Final  . Specific Gravity, Urine 12/24/2011 1.022  1.005 - 1.030 Final  . pH 12/24/2011 7.0  5.0 - 8.0 Final  . Glucose, UA 12/24/2011 NEGATIVE  NEGATIVE mg/dL Final  . Hgb urine dipstick 12/24/2011 NEGATIVE  NEGATIVE Final  . Bilirubin Urine 12/24/2011 NEGATIVE  NEGATIVE Final  . Ketones, ur 12/24/2011 NEGATIVE  NEGATIVE mg/dL Final  . Protein, ur 21/30/8657 NEGATIVE  NEGATIVE mg/dL Final  . Urobilinogen, UA 12/24/2011 0.2  0.0 - 1.0 mg/dL Final  . Nitrite 84/69/6295 NEGATIVE  NEGATIVE Final  . Leukocytes, UA 12/24/2011 NEGATIVE  NEGATIVE Final   MICROSCOPIC NOT DONE ON URINES WITH NEGATIVE PROTEIN, BLOOD, LEUKOCYTES, NITRITE, OR GLUCOSE <1000 mg/dL.  Marland Kitchen MRSA, PCR 12/24/2011 NEGATIVE  NEGATIVE Final  . Staphylococcus aureus 12/24/2011 POSITIVE* NEGATIVE Final   Comment:                                 The Xpert SA Assay (FDA  approved for NASAL specimens                          in patients over 54 years of age),                          is one component of                          a comprehensive surveillance                          program.  Test performance has                          been validated by [employer] for patients greater                          than or equal to 59 year old.                          It is not intended                          to diagnose infection nor to                          guide or monitor treatment.  . WBC 12/24/2011 7.7  4.0 - 10.5 K/uL Final  . RBC 12/24/2011 5.38  4.22 - 5.81 MIL/uL Final  . Hemoglobin 12/24/2011 14.3  13.0 - 17.0 g/dL Final  . HCT 30/86/5784 43.2  39.0 - 52.0 % Final  . MCV 12/24/2011 80.3  78.0 - 100.0 fL Final  . MCH 12/24/2011 26.6  26.0 - 34.0 pg Final  . MCHC 12/24/2011 33.1  30.0 - 36.0 g/dL Final  . RDW 69/62/9528 14.7  11.5 - 15.5 % Final  . Platelets 12/24/2011 336  150  - 400 K/uL Final     X-Rays:Dg Chest 2 View  12/24/2011  *RADIOLOGY REPORT*  Clinical Data: Preoperative evaluation for hip replacement, hypertension, coronary disease, status post bypass  CHEST - 2 VIEW  Comparison: 05/27/2004  Findings: Coronary bypass changes noted.  Stable cardiomegaly with normal vascularity.  Negative for CHF or focal pneumonia.  Chronic central bronchitic changes.  No focal pneumonia, collapse, consolidation, effusion, pneumothorax.  Trachea is midline.  IMPRESSION: Cardiomegaly without CHF or pneumonia  Prior coronary bypass  Chronic bronchitic changes   Original Report Authenticated By: Judie Petit. Shick, M.D.    Dg Hip Portable 1 View Right  01/03/2012  *RADIOLOGY REPORT*  Clinical Data: Postop  PORTABLE RIGHT HIP - 1 VIEW  Comparison: 11/22/2006  Findings: Right total hip arthroplasty is in place.  No breakage or loosening of the hardware. Anatomic alignment of the osseous and prosthetic structures.  IMPRESSION: Right total hip arthroplasty anatomically aligned.   Original Report Authenticated By: Jolaine Click, M.D.     Hospital Course: Patient was admitted to [facility] and taken to the OR and underwent the above state procedure without complications.  Patient tolerated the procedure well and was  later transferred to the recovery room and then to the orthopaedic floor for postoperative care.  They were given PO and IV analgesics for pain control following their surgery.  They were given 24 hours of postoperative antibiotics of  Anti-infectives     Start     Dose/Rate Route Frequency Ordered Stop   01/03/12 2000   ceFAZolin (ANCEF) IVPB 1 g/50 mL premix        1 g 100 mL/hr over 30 Minutes Intravenous Every 6 hours 01/03/12 1727 01/04/12 0316   01/03/12 1443   polymyxin B 500,000 Units, bacitracin 50,000 Units in sodium chloride irrigation 0.9 % 500 mL irrigation  Status:  Discontinued          As needed 01/03/12 1443 01/03/12 1554   01/03/12 1014   ceFAZolin (ANCEF)  IVPB 2 g/50 mL premix        2 g 100 mL/hr over 30 Minutes Intravenous 60 min pre-op 01/03/12 1014 01/03/12 1342         and started on DVT prophylaxis in the form of Xarelto.   PT and OT were ordered for total hip protocol.  The patient was allowed to be PWB 50% with therapy. Discharge planning was consulted to help with postop disposition and equipment needs.  Patient had a rough night on the evening of surgery, with little sleep and development of numbness over dorsum of right foot.  He started to get up OOB with therapy on day one.  Hemovac drain was pulled without difficulty. He had a significant amount of bloody drainage from the hemovac site, requiring a compression dressing.  The knee immobilizer was removed and discontinued.  Continued to work with therapy into day two.  Dressing was changed on day two and the incision was clean and dry with minimal bloody drainage.  By day three, the patient had progressed with therapy and meeting their goals.  Incision was healing well.  Patient was seen in rounds and was ready to go home.   Discharge Medications: Prior to Admission medications   Medication Sig Start Date End Date Taking? Authorizing Provider  carvedilol (COREG) 12.5 MG tablet Take 12.5 mg by mouth 2 (two) times daily with a meal.   Yes Historical Provider, MD  ezetimibe (ZETIA) 10 MG tablet Take 10 mg by mouth every evening.   Yes Historical Provider, MD  levothyroxine (SYNTHROID, LEVOTHROID) 137 MCG tablet Take 137-205.5 mcg by mouth See admin instructions. Pt takes an additional  1/2 tablet on Monday Wednesday  Friday   Yes Historical Provider, MD  oxymetazoline (AFRIN) 0.05 % nasal spray Place 2 sprays into the nose 2 (two) times daily as needed. Congestion   Yes Historical Provider, MD  pravastatin (PRAVACHOL) 80 MG tablet Take 80 mg by mouth every evening.   Yes Historical Provider, MD  testosterone cypionate (DEPOTESTOTERONE CYPIONATE) 100 MG/ML injection Inject 200 mg into the  muscle every 14 (fourteen) days. For IM use only   Yes Historical Provider, MD  venlafaxine XR (EFFEXOR-XR) 150 MG 24 hr capsule Take 150 mg by mouth every evening.   Yes Historical Provider, MD  ferrous sulfate 325 (65 FE) MG tablet Take 1 tablet (325 mg total) by mouth 3 (three) times daily after meals. 01/06/12   Altus Zaino Tamala Ser, PA  methocarbamol (ROBAXIN) 500 MG tablet Take 1 tablet (500 mg total) by mouth every 6 (six) hours as needed. 01/06/12   Akiem Urieta Tamala Ser, PA  oxyCODONE-acetaminophen (PERCOCET/ROXICET) 5-325 MG per tablet Take 1-2 tablets  by mouth every 4 (four) hours as needed. 01/06/12   Ralpheal Zappone Tamala Ser, PA  polyethylene glycol (MIRALAX / GLYCOLAX) packet Take 17 g by mouth daily. 01/06/12   Daelyn Pettaway Tamala Ser, PA  predniSONE (STERAPRED UNI-PAK) 5 MG TABS Take 1 tablet (5 mg total) by mouth taper from 4 doses each day to 1 dose and stop. 01/06/12   Santos Hardwick Tamala Ser, PA  rivaroxaban (XARELTO) 10 MG TABS tablet Take 1 tablet (10 mg total) by mouth daily before breakfast. 01/06/12   Madix Blowe Tamala Ser, PA    Diet: Cardiac diet Activity:PWB 50% No bending hip over 90 degrees- A "L" Angle Do not cross legs Do not let foot roll inward When turning these patients a pillow should be placed between the patient's legs to prevent crossing. Patients should have the affected knee fully extended when trying to sit or stand from all surfaces to prevent excessive hip flexion. When ambulating and turning toward the affected side the affected leg should have the toes turned out prior to moving the walker and the rest of patient's body as to prevent internal rotation/ turning in of the leg. Abduction pillows are the most effective way to prevent a patient from not crossing legs or turning toes in at rest. If an abduction pillow is not ordered placing a regular pillow length wise between the patient's legs is also an effective reminder. It is imperative that these precautions be  maintained so that the surgical hip does not dislocate. Follow-up:in 2 weeks Disposition - Home Discharged Condition: fair   Discharge Orders    Future Orders Please Complete By Expires   Diet - low sodium heart healthy      Call MD / Call 911      Comments:   If you experience chest pain or shortness of breath, CALL 911 and be transported to the hospital emergency room.  If you develope a fever above 101 F, pus (white drainage) or increased drainage or redness at the wound, or calf pain, call your surgeon's office.   Constipation Prevention      Comments:   Drink plenty of fluids.  Prune juice may be helpful.  You may use a stool softener, such as Colace (over the counter) 100 mg twice a day.  Use MiraLax (over the counter) for constipation as needed.   Increase activity slowly as tolerated      Discharge instructions      Comments:   Walk with your walker. Weight bearing as instructed. 50% [facility] will follow you at home for your therapy. Change your dressing daily. You may leave the dressing over the incision up until one week from surgery. Shower only, no tub bath. Apply saran wrap over the wound for 2 days when showering.  Call if any temperatures greater than 101 or any wound complications: [phone] during the day and ask for Dr. Jeannetta Ellis nurse, [facility]. Follow up in office with Dr. Darrelyn Hillock 2 weeks after surgery   Driving restrictions      Comments:   No driving until checked in the office   Follow the hip precautions as taught in Physical Therapy          Medication List     As of 01/10/2012  7:48 AM    STOP taking these medications         acetaminophen 500 MG tablet   Commonly known as: TYLENOL      APPLE CIDER VINEGAR PO  aspirin EC 325 MG tablet      cholecalciferol 1000 UNITS tablet   Commonly known as: VITAMIN D      CINNAMON PO      Co Q 10 100 MG Caps      [employer] VITAMIN B12 2000 MCG tablet   Generic drug: cyanocobalamin       Fish Oil 1000 MG Caps      Ginger Root 550 MG Caps      GLUCOSAMINE CHONDROITIN JOINT PO      indomethacin 25 MG capsule   Commonly known as: INDOCIN      multivitamin with minerals Tabs      OVER THE COUNTER MEDICATION      TAKE these medications         carvedilol 12.5 MG tablet   Commonly known as: COREG   Take 12.5 mg by mouth 2 (two) times daily with a meal.      ezetimibe 10 MG tablet   Commonly known as: ZETIA   Take 10 mg by mouth every evening.      ferrous sulfate 325 (65 FE) MG tablet   Take 1 tablet (325 mg total) by mouth 3 (three) times daily after meals.      levothyroxine 137 MCG tablet   Commonly known as: SYNTHROID, LEVOTHROID   Take 137-205.5 mcg by mouth See admin instructions. Pt takes an additional  1/2 tablet on Monday Wednesday  Friday      methocarbamol 500 MG tablet   Commonly known as: ROBAXIN   Take 1 tablet (500 mg total) by mouth every 6 (six) hours as needed.      oxyCODONE-acetaminophen 5-325 MG per tablet   Commonly known as: PERCOCET/ROXICET   Take 1-2 tablets by mouth every 4 (four) hours as needed.      oxymetazoline 0.05 % nasal spray   Commonly known as: AFRIN   Place 2 sprays into the nose 2 (two) times daily as needed. Congestion      polyethylene glycol packet   Commonly known as: MIRALAX / GLYCOLAX   Take 17 g by mouth daily.      pravastatin 80 MG tablet   Commonly known as: PRAVACHOL   Take 80 mg by mouth every evening.      predniSONE 5 MG Tabs   Commonly known as: STERAPRED UNI-PAK   Take 1 tablet (5 mg total) by mouth taper from 4 doses each day to 1 dose and stop.      rivaroxaban 10 MG Tabs tablet   Commonly known as: XARELTO   Take 1 tablet (10 mg total) by mouth daily before breakfast.      testosterone cypionate 100 MG/ML injection   Commonly known as: DEPOTESTOTERONE CYPIONATE   Inject 200 mg into the muscle every 14 (fourteen) days. For IM use only      venlafaxine XR 150 MG 24 hr capsule   Commonly  known as: EFFEXOR-XR   Take 150 mg by mouth every evening.         SignedCeledonio Savage, Michael Walrath LAUREN 01/10/2012, 7:48 AM

## 2012-01-11 DIAGNOSIS — Z7901 Long term (current) use of anticoagulants: Secondary | ICD-10-CM | POA: Diagnosis not present

## 2012-01-11 DIAGNOSIS — Z471 Aftercare following joint replacement surgery: Secondary | ICD-10-CM | POA: Diagnosis not present

## 2012-01-11 DIAGNOSIS — F329 Major depressive disorder, single episode, unspecified: Secondary | ICD-10-CM | POA: Diagnosis not present

## 2012-01-11 DIAGNOSIS — IMO0001 Reserved for inherently not codable concepts without codable children: Secondary | ICD-10-CM | POA: Diagnosis not present

## 2012-01-11 DIAGNOSIS — Z96649 Presence of unspecified artificial hip joint: Secondary | ICD-10-CM | POA: Diagnosis not present

## 2012-01-11 DIAGNOSIS — F3289 Other specified depressive episodes: Secondary | ICD-10-CM | POA: Diagnosis not present

## 2012-01-13 DIAGNOSIS — Z7901 Long term (current) use of anticoagulants: Secondary | ICD-10-CM | POA: Diagnosis not present

## 2012-01-13 DIAGNOSIS — Z96649 Presence of unspecified artificial hip joint: Secondary | ICD-10-CM | POA: Diagnosis not present

## 2012-01-13 DIAGNOSIS — F3289 Other specified depressive episodes: Secondary | ICD-10-CM | POA: Diagnosis not present

## 2012-01-13 DIAGNOSIS — Z471 Aftercare following joint replacement surgery: Secondary | ICD-10-CM | POA: Diagnosis not present

## 2012-01-13 DIAGNOSIS — F329 Major depressive disorder, single episode, unspecified: Secondary | ICD-10-CM | POA: Diagnosis not present

## 2012-01-13 DIAGNOSIS — IMO0001 Reserved for inherently not codable concepts without codable children: Secondary | ICD-10-CM | POA: Diagnosis not present

## 2012-01-18 DIAGNOSIS — IMO0001 Reserved for inherently not codable concepts without codable children: Secondary | ICD-10-CM | POA: Diagnosis not present

## 2012-01-18 DIAGNOSIS — Z96649 Presence of unspecified artificial hip joint: Secondary | ICD-10-CM | POA: Diagnosis not present

## 2012-01-18 DIAGNOSIS — F329 Major depressive disorder, single episode, unspecified: Secondary | ICD-10-CM | POA: Diagnosis not present

## 2012-01-18 DIAGNOSIS — Z471 Aftercare following joint replacement surgery: Secondary | ICD-10-CM | POA: Diagnosis not present

## 2012-01-18 DIAGNOSIS — Z7901 Long term (current) use of anticoagulants: Secondary | ICD-10-CM | POA: Diagnosis not present

## 2012-01-18 DIAGNOSIS — F3289 Other specified depressive episodes: Secondary | ICD-10-CM | POA: Diagnosis not present

## 2012-01-19 DIAGNOSIS — F329 Major depressive disorder, single episode, unspecified: Secondary | ICD-10-CM | POA: Diagnosis not present

## 2012-01-19 DIAGNOSIS — IMO0001 Reserved for inherently not codable concepts without codable children: Secondary | ICD-10-CM | POA: Diagnosis not present

## 2012-01-19 DIAGNOSIS — Z471 Aftercare following joint replacement surgery: Secondary | ICD-10-CM | POA: Diagnosis not present

## 2012-01-19 DIAGNOSIS — Z96649 Presence of unspecified artificial hip joint: Secondary | ICD-10-CM | POA: Diagnosis not present

## 2012-01-19 DIAGNOSIS — Z7901 Long term (current) use of anticoagulants: Secondary | ICD-10-CM | POA: Diagnosis not present

## 2012-01-19 DIAGNOSIS — F3289 Other specified depressive episodes: Secondary | ICD-10-CM | POA: Diagnosis not present

## 2012-01-24 DIAGNOSIS — Z96649 Presence of unspecified artificial hip joint: Secondary | ICD-10-CM | POA: Diagnosis not present

## 2012-01-24 DIAGNOSIS — Z471 Aftercare following joint replacement surgery: Secondary | ICD-10-CM | POA: Diagnosis not present

## 2012-01-24 DIAGNOSIS — IMO0001 Reserved for inherently not codable concepts without codable children: Secondary | ICD-10-CM | POA: Diagnosis not present

## 2012-01-24 DIAGNOSIS — F3289 Other specified depressive episodes: Secondary | ICD-10-CM | POA: Diagnosis not present

## 2012-01-24 DIAGNOSIS — F329 Major depressive disorder, single episode, unspecified: Secondary | ICD-10-CM | POA: Diagnosis not present

## 2012-01-24 DIAGNOSIS — Z7901 Long term (current) use of anticoagulants: Secondary | ICD-10-CM | POA: Diagnosis not present

## 2012-01-26 DIAGNOSIS — IMO0001 Reserved for inherently not codable concepts without codable children: Secondary | ICD-10-CM | POA: Diagnosis not present

## 2012-01-26 DIAGNOSIS — Z7901 Long term (current) use of anticoagulants: Secondary | ICD-10-CM | POA: Diagnosis not present

## 2012-01-26 DIAGNOSIS — Z471 Aftercare following joint replacement surgery: Secondary | ICD-10-CM | POA: Diagnosis not present

## 2012-01-26 DIAGNOSIS — F329 Major depressive disorder, single episode, unspecified: Secondary | ICD-10-CM | POA: Diagnosis not present

## 2012-01-26 DIAGNOSIS — F3289 Other specified depressive episodes: Secondary | ICD-10-CM | POA: Diagnosis not present

## 2012-01-26 DIAGNOSIS — Z96649 Presence of unspecified artificial hip joint: Secondary | ICD-10-CM | POA: Diagnosis not present

## 2012-01-28 DIAGNOSIS — Z7901 Long term (current) use of anticoagulants: Secondary | ICD-10-CM | POA: Diagnosis not present

## 2012-01-28 DIAGNOSIS — F3289 Other specified depressive episodes: Secondary | ICD-10-CM | POA: Diagnosis not present

## 2012-01-28 DIAGNOSIS — IMO0001 Reserved for inherently not codable concepts without codable children: Secondary | ICD-10-CM | POA: Diagnosis not present

## 2012-01-28 DIAGNOSIS — F329 Major depressive disorder, single episode, unspecified: Secondary | ICD-10-CM | POA: Diagnosis not present

## 2012-01-28 DIAGNOSIS — Z471 Aftercare following joint replacement surgery: Secondary | ICD-10-CM | POA: Diagnosis not present

## 2012-01-28 DIAGNOSIS — Z96649 Presence of unspecified artificial hip joint: Secondary | ICD-10-CM | POA: Diagnosis not present

## 2012-01-31 DIAGNOSIS — M25569 Pain in unspecified knee: Secondary | ICD-10-CM | POA: Diagnosis not present

## 2012-01-31 DIAGNOSIS — Z4789 Encounter for other orthopedic aftercare: Secondary | ICD-10-CM | POA: Diagnosis not present

## 2012-02-01 DIAGNOSIS — Z471 Aftercare following joint replacement surgery: Secondary | ICD-10-CM | POA: Diagnosis not present

## 2012-02-01 DIAGNOSIS — IMO0001 Reserved for inherently not codable concepts without codable children: Secondary | ICD-10-CM | POA: Diagnosis not present

## 2012-02-01 DIAGNOSIS — Z7901 Long term (current) use of anticoagulants: Secondary | ICD-10-CM | POA: Diagnosis not present

## 2012-02-01 DIAGNOSIS — F329 Major depressive disorder, single episode, unspecified: Secondary | ICD-10-CM | POA: Diagnosis not present

## 2012-02-01 DIAGNOSIS — Z96649 Presence of unspecified artificial hip joint: Secondary | ICD-10-CM | POA: Diagnosis not present

## 2012-02-01 DIAGNOSIS — F3289 Other specified depressive episodes: Secondary | ICD-10-CM | POA: Diagnosis not present

## 2012-02-04 DIAGNOSIS — Z7901 Long term (current) use of anticoagulants: Secondary | ICD-10-CM | POA: Diagnosis not present

## 2012-02-04 DIAGNOSIS — Z96649 Presence of unspecified artificial hip joint: Secondary | ICD-10-CM | POA: Diagnosis not present

## 2012-02-04 DIAGNOSIS — F329 Major depressive disorder, single episode, unspecified: Secondary | ICD-10-CM | POA: Diagnosis not present

## 2012-02-04 DIAGNOSIS — IMO0001 Reserved for inherently not codable concepts without codable children: Secondary | ICD-10-CM | POA: Diagnosis not present

## 2012-02-04 DIAGNOSIS — Z471 Aftercare following joint replacement surgery: Secondary | ICD-10-CM | POA: Diagnosis not present

## 2012-02-04 DIAGNOSIS — F3289 Other specified depressive episodes: Secondary | ICD-10-CM | POA: Diagnosis not present

## 2012-02-11 DIAGNOSIS — E7800 Pure hypercholesterolemia, unspecified: Secondary | ICD-10-CM | POA: Diagnosis not present

## 2012-02-11 DIAGNOSIS — I10 Essential (primary) hypertension: Secondary | ICD-10-CM | POA: Diagnosis not present

## 2012-02-11 DIAGNOSIS — I2510 Atherosclerotic heart disease of native coronary artery without angina pectoris: Secondary | ICD-10-CM | POA: Diagnosis not present

## 2012-02-11 DIAGNOSIS — I428 Other cardiomyopathies: Secondary | ICD-10-CM | POA: Diagnosis not present

## 2012-02-25 DIAGNOSIS — Z96649 Presence of unspecified artificial hip joint: Secondary | ICD-10-CM | POA: Diagnosis not present

## 2012-03-06 DIAGNOSIS — E7800 Pure hypercholesterolemia, unspecified: Secondary | ICD-10-CM | POA: Diagnosis not present

## 2012-04-25 DIAGNOSIS — Z96649 Presence of unspecified artificial hip joint: Secondary | ICD-10-CM | POA: Diagnosis not present

## 2012-05-16 ENCOUNTER — Other Ambulatory Visit: Payer: Self-pay | Admitting: Dermatology

## 2012-05-16 DIAGNOSIS — Z85828 Personal history of other malignant neoplasm of skin: Secondary | ICD-10-CM | POA: Diagnosis not present

## 2012-05-16 DIAGNOSIS — C44519 Basal cell carcinoma of skin of other part of trunk: Secondary | ICD-10-CM | POA: Diagnosis not present

## 2012-05-16 DIAGNOSIS — D485 Neoplasm of uncertain behavior of skin: Secondary | ICD-10-CM | POA: Diagnosis not present

## 2012-05-16 DIAGNOSIS — D1801 Hemangioma of skin and subcutaneous tissue: Secondary | ICD-10-CM | POA: Diagnosis not present

## 2012-05-16 DIAGNOSIS — C44611 Basal cell carcinoma of skin of unspecified upper limb, including shoulder: Secondary | ICD-10-CM | POA: Diagnosis not present

## 2012-05-16 DIAGNOSIS — L738 Other specified follicular disorders: Secondary | ICD-10-CM | POA: Diagnosis not present

## 2012-08-31 DIAGNOSIS — M169 Osteoarthritis of hip, unspecified: Secondary | ICD-10-CM | POA: Diagnosis not present

## 2012-08-31 DIAGNOSIS — M1610 Unilateral primary osteoarthritis, unspecified hip: Secondary | ICD-10-CM | POA: Diagnosis not present

## 2012-10-16 DIAGNOSIS — Z23 Encounter for immunization: Secondary | ICD-10-CM | POA: Diagnosis not present

## 2012-11-01 DIAGNOSIS — Z86010 Personal history of colonic polyps: Secondary | ICD-10-CM | POA: Diagnosis not present

## 2012-11-01 DIAGNOSIS — Z09 Encounter for follow-up examination after completed treatment for conditions other than malignant neoplasm: Secondary | ICD-10-CM | POA: Diagnosis not present

## 2012-11-14 DIAGNOSIS — D1801 Hemangioma of skin and subcutaneous tissue: Secondary | ICD-10-CM | POA: Diagnosis not present

## 2012-11-14 DIAGNOSIS — L821 Other seborrheic keratosis: Secondary | ICD-10-CM | POA: Diagnosis not present

## 2012-11-14 DIAGNOSIS — L819 Disorder of pigmentation, unspecified: Secondary | ICD-10-CM | POA: Diagnosis not present

## 2012-11-14 DIAGNOSIS — Z85828 Personal history of other malignant neoplasm of skin: Secondary | ICD-10-CM | POA: Diagnosis not present

## 2012-11-22 DIAGNOSIS — E039 Hypothyroidism, unspecified: Secondary | ICD-10-CM | POA: Diagnosis not present

## 2012-11-22 DIAGNOSIS — E7800 Pure hypercholesterolemia, unspecified: Secondary | ICD-10-CM | POA: Diagnosis not present

## 2012-11-22 DIAGNOSIS — F329 Major depressive disorder, single episode, unspecified: Secondary | ICD-10-CM | POA: Diagnosis not present

## 2012-11-22 DIAGNOSIS — I10 Essential (primary) hypertension: Secondary | ICD-10-CM | POA: Diagnosis not present

## 2012-11-22 DIAGNOSIS — I2510 Atherosclerotic heart disease of native coronary artery without angina pectoris: Secondary | ICD-10-CM | POA: Diagnosis not present

## 2012-11-22 DIAGNOSIS — Z0000 Encounter for general adult medical examination without abnormal findings: Secondary | ICD-10-CM | POA: Diagnosis not present

## 2012-11-22 DIAGNOSIS — G4733 Obstructive sleep apnea (adult) (pediatric): Secondary | ICD-10-CM | POA: Diagnosis not present

## 2012-11-27 DIAGNOSIS — M1610 Unilateral primary osteoarthritis, unspecified hip: Secondary | ICD-10-CM | POA: Diagnosis not present

## 2012-11-27 DIAGNOSIS — M169 Osteoarthritis of hip, unspecified: Secondary | ICD-10-CM | POA: Diagnosis not present

## 2012-12-15 ENCOUNTER — Telehealth: Payer: Self-pay | Admitting: Cardiology

## 2012-12-15 NOTE — Telephone Encounter (Signed)
Spoke with patient advised i will send to Dr. Anne Fu for Review

## 2012-12-15 NOTE — Telephone Encounter (Signed)
New message  Due to extreme pain in right arm patient has stopped taken his statin. He's been through a lot of medication and doesn't want the muscle pain. He would like to know what he can do ? The only protection he is taken OTC cholest off. He's not sure how effective they are. Please call and advise.

## 2012-12-19 ENCOUNTER — Telehealth: Payer: Self-pay | Admitting: Cardiology

## 2012-12-19 NOTE — Telephone Encounter (Signed)
Spoke with patient ,stop pravastatin, Start back on Zetia 10mg  once a day.  Sample are at the front desk. Patient stopped Zetia due to cost but will soon be out of donut whole with medicare.

## 2013-01-02 DIAGNOSIS — G5600 Carpal tunnel syndrome, unspecified upper limb: Secondary | ICD-10-CM | POA: Diagnosis not present

## 2013-01-02 DIAGNOSIS — M25539 Pain in unspecified wrist: Secondary | ICD-10-CM | POA: Diagnosis not present

## 2013-01-02 DIAGNOSIS — R209 Unspecified disturbances of skin sensation: Secondary | ICD-10-CM | POA: Diagnosis not present

## 2013-01-05 DIAGNOSIS — G5600 Carpal tunnel syndrome, unspecified upper limb: Secondary | ICD-10-CM | POA: Diagnosis not present

## 2013-01-24 DIAGNOSIS — G5600 Carpal tunnel syndrome, unspecified upper limb: Secondary | ICD-10-CM | POA: Diagnosis not present

## 2013-01-28 ENCOUNTER — Other Ambulatory Visit: Payer: Self-pay | Admitting: *Deleted

## 2013-01-28 DIAGNOSIS — E785 Hyperlipidemia, unspecified: Secondary | ICD-10-CM

## 2013-01-28 DIAGNOSIS — Z79899 Other long term (current) drug therapy: Secondary | ICD-10-CM

## 2013-03-06 ENCOUNTER — Other Ambulatory Visit: Payer: Medicare Other

## 2013-03-21 DIAGNOSIS — I10 Essential (primary) hypertension: Secondary | ICD-10-CM | POA: Diagnosis not present

## 2013-03-21 DIAGNOSIS — G4733 Obstructive sleep apnea (adult) (pediatric): Secondary | ICD-10-CM | POA: Diagnosis not present

## 2013-03-21 DIAGNOSIS — E7800 Pure hypercholesterolemia, unspecified: Secondary | ICD-10-CM | POA: Diagnosis not present

## 2013-04-23 DIAGNOSIS — L821 Other seborrheic keratosis: Secondary | ICD-10-CM | POA: Diagnosis not present

## 2013-04-23 DIAGNOSIS — D1801 Hemangioma of skin and subcutaneous tissue: Secondary | ICD-10-CM | POA: Diagnosis not present

## 2013-04-23 DIAGNOSIS — Z85828 Personal history of other malignant neoplasm of skin: Secondary | ICD-10-CM | POA: Diagnosis not present

## 2013-07-22 IMAGING — CR DG CHEST 2V
2 series · 2 of 2 positions shown · non-contrast
Comparison: 05/27/2004

CLINICAL DATA: Preoperative evaluation for hip replacement,
hypertension, coronary disease, status post bypass

CHEST - 2 VIEW

[w chest pa]
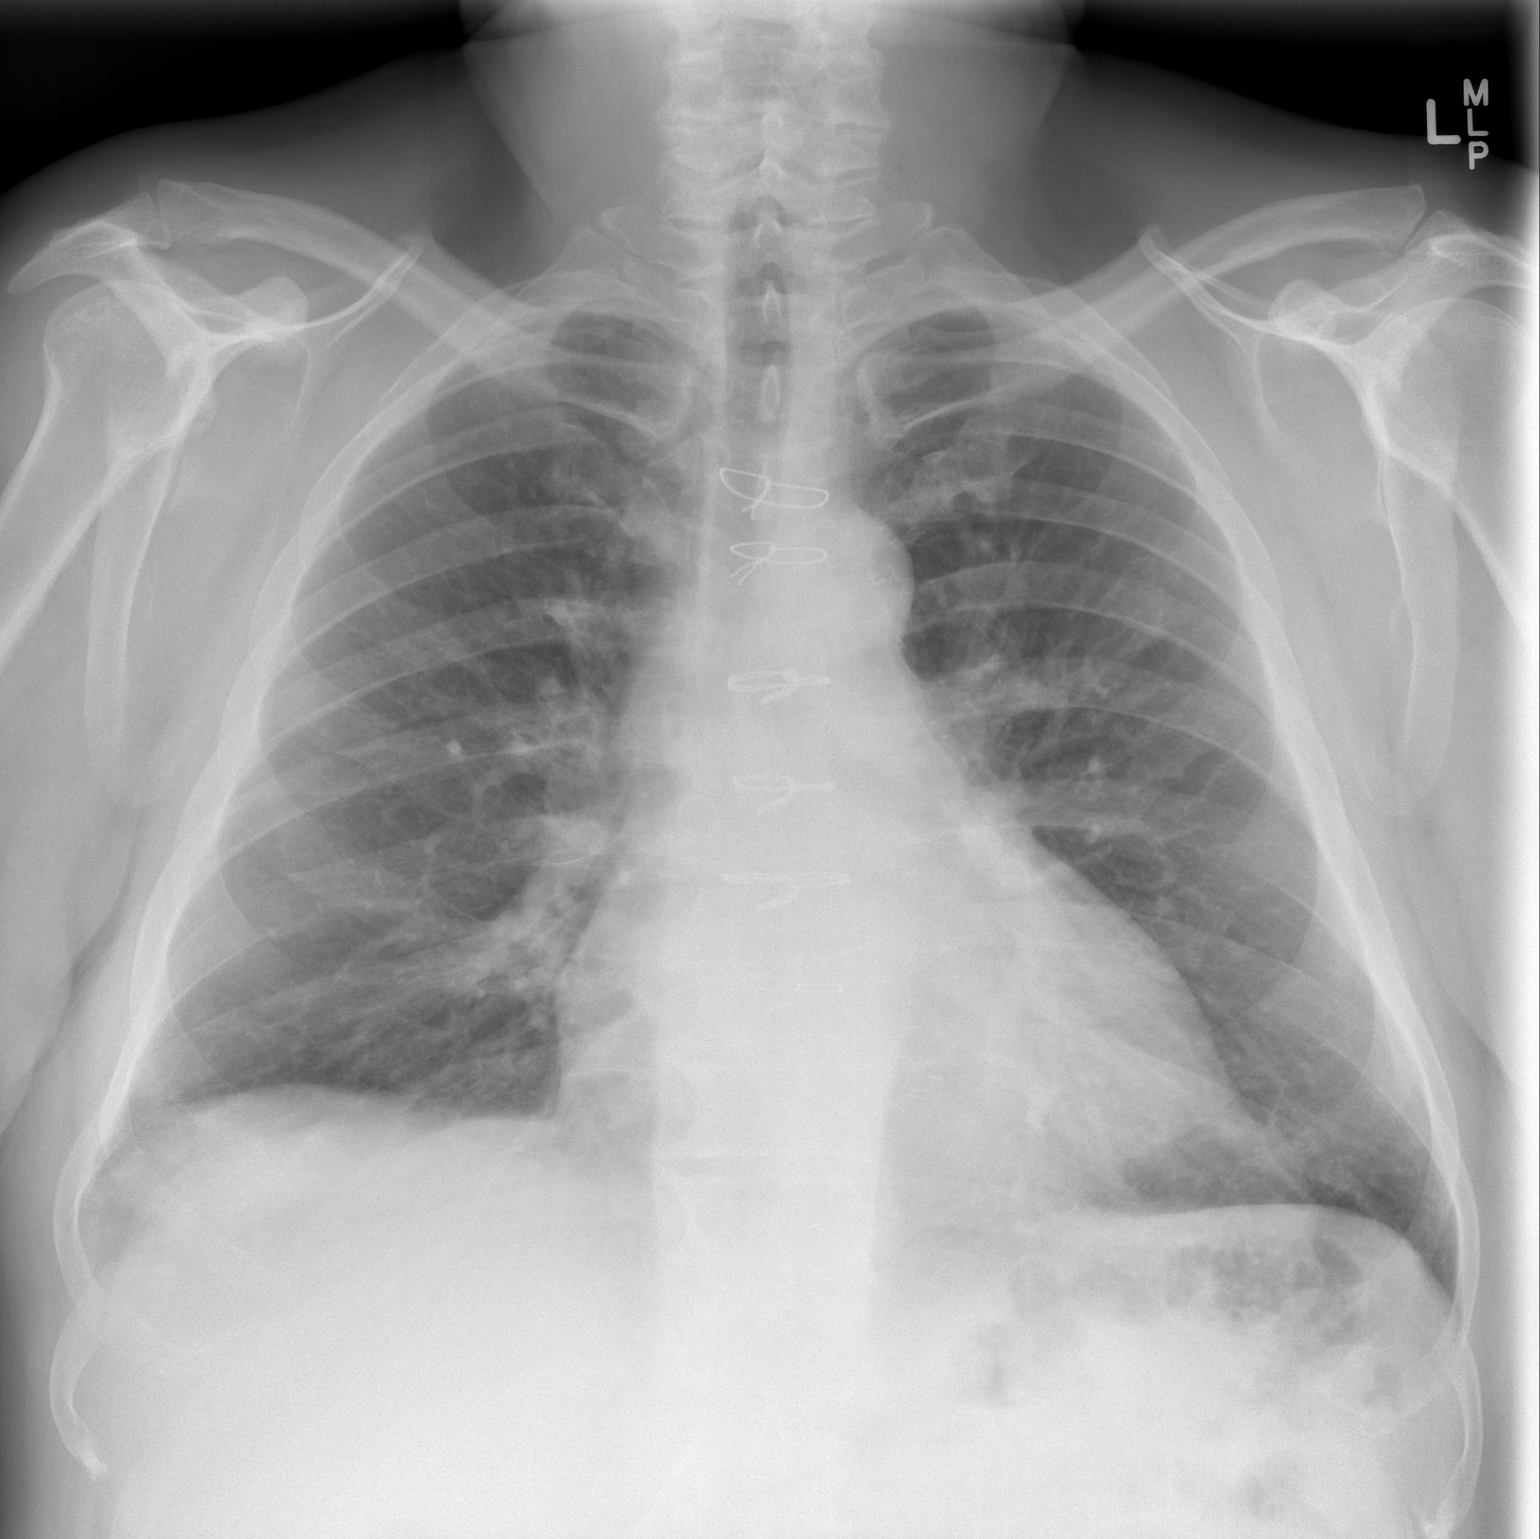

[w chest lat]
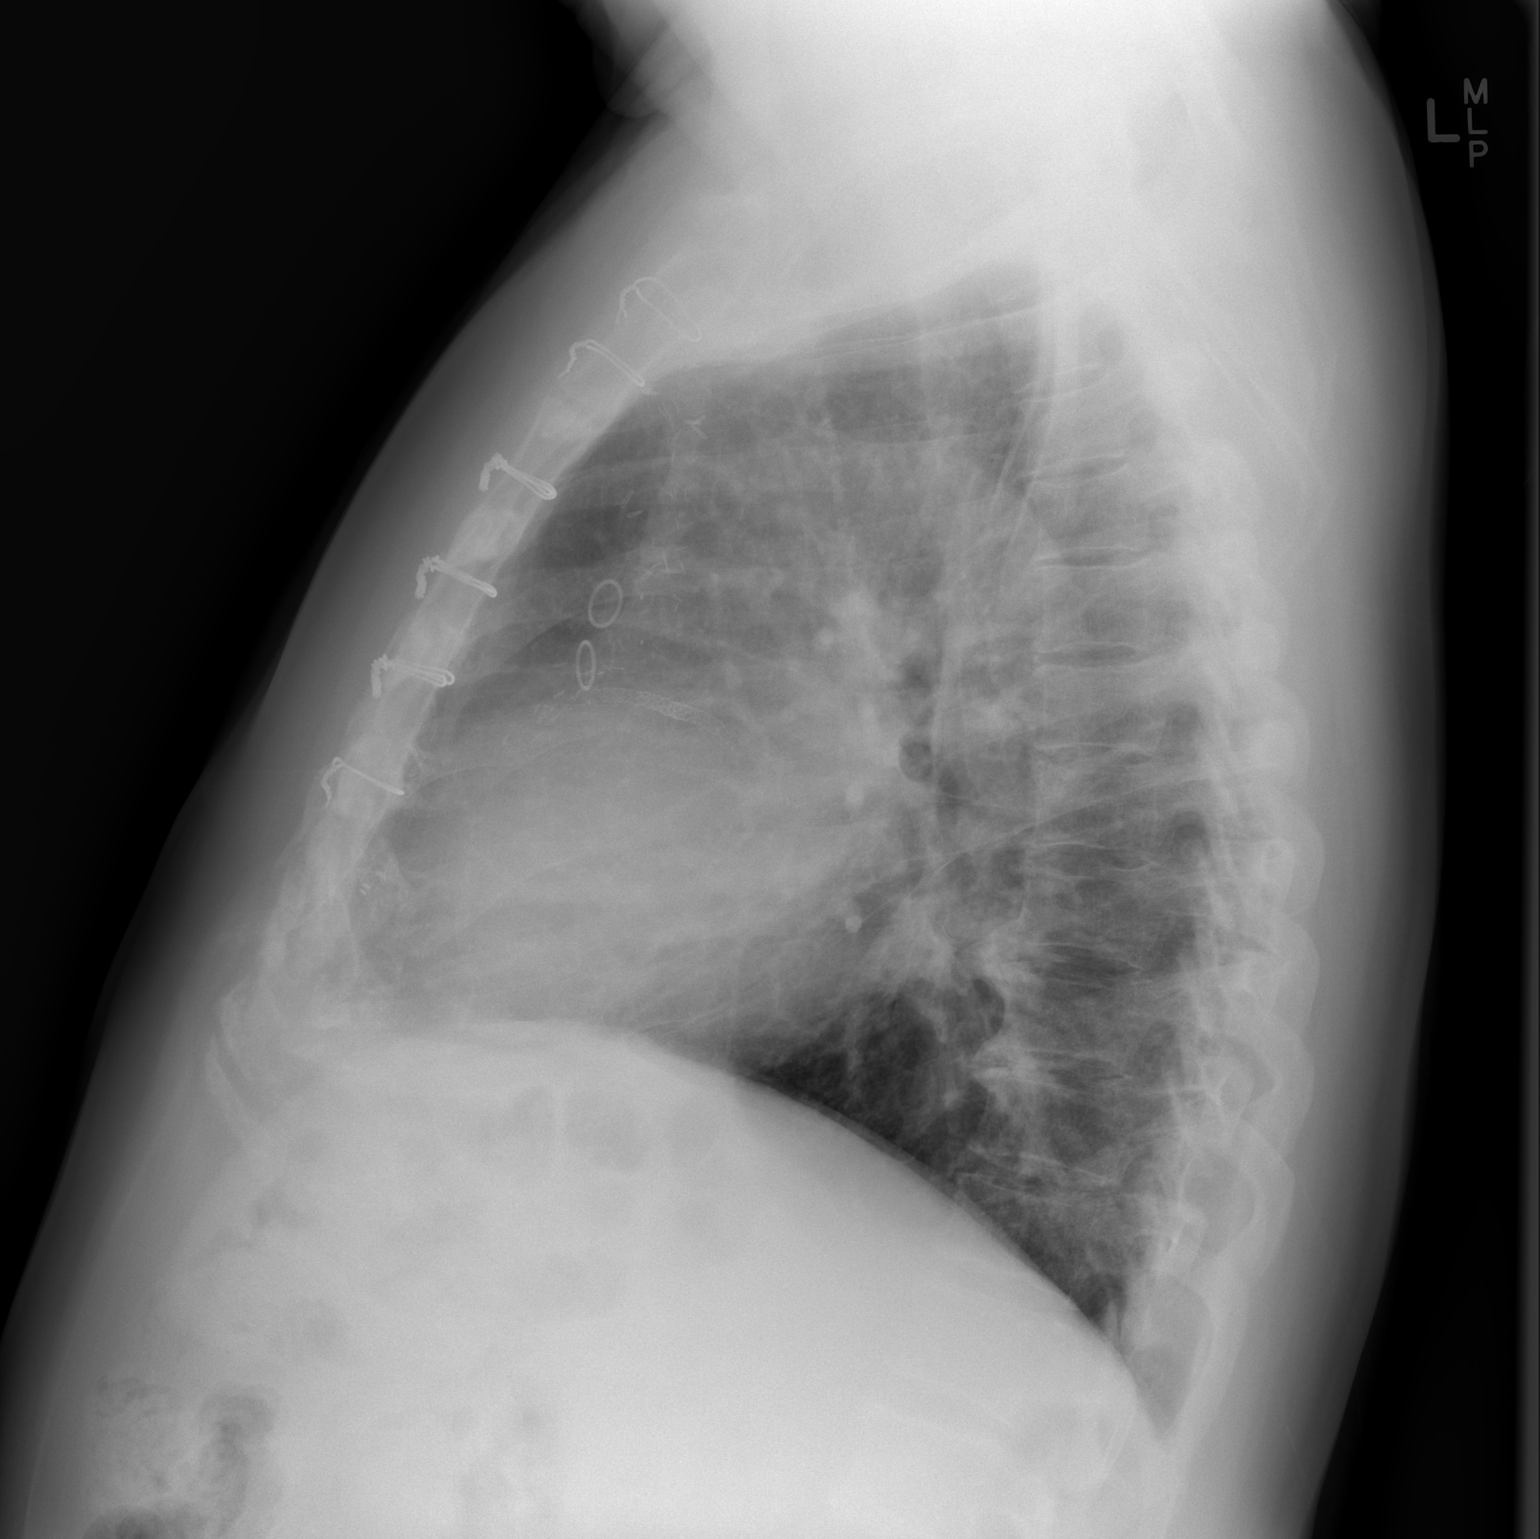

[2 of 2 positions shown; findings below may reference images not displayed]

FINDINGS: Coronary bypass changes noted.  Stable cardiomegaly with
normal vascularity.  Negative for CHF or focal pneumonia.  Chronic
central bronchitic changes.  No focal pneumonia, collapse,
consolidation, effusion, pneumothorax.  Trachea is midline.
IMPRESSION: Cardiomegaly without CHF or pneumonia

Prior coronary bypass

Chronic bronchitic changes

## 2013-08-15 DIAGNOSIS — I10 Essential (primary) hypertension: Secondary | ICD-10-CM | POA: Diagnosis not present

## 2013-10-29 DIAGNOSIS — Z23 Encounter for immunization: Secondary | ICD-10-CM | POA: Diagnosis not present

## 2014-01-02 DIAGNOSIS — M546 Pain in thoracic spine: Secondary | ICD-10-CM | POA: Diagnosis not present

## 2014-01-28 DIAGNOSIS — G4733 Obstructive sleep apnea (adult) (pediatric): Secondary | ICD-10-CM | POA: Diagnosis not present

## 2014-01-28 DIAGNOSIS — I10 Essential (primary) hypertension: Secondary | ICD-10-CM | POA: Diagnosis not present

## 2014-01-28 DIAGNOSIS — E291 Testicular hypofunction: Secondary | ICD-10-CM | POA: Diagnosis not present

## 2014-01-28 DIAGNOSIS — F324 Major depressive disorder, single episode, in partial remission: Secondary | ICD-10-CM | POA: Diagnosis not present

## 2014-01-28 DIAGNOSIS — N400 Enlarged prostate without lower urinary tract symptoms: Secondary | ICD-10-CM | POA: Diagnosis not present

## 2014-01-28 DIAGNOSIS — E039 Hypothyroidism, unspecified: Secondary | ICD-10-CM | POA: Diagnosis not present

## 2014-01-28 DIAGNOSIS — Z0000 Encounter for general adult medical examination without abnormal findings: Secondary | ICD-10-CM | POA: Diagnosis not present

## 2014-01-28 DIAGNOSIS — Z1389 Encounter for screening for other disorder: Secondary | ICD-10-CM | POA: Diagnosis not present

## 2014-01-28 DIAGNOSIS — Z23 Encounter for immunization: Secondary | ICD-10-CM | POA: Diagnosis not present

## 2014-01-28 DIAGNOSIS — I25810 Atherosclerosis of coronary artery bypass graft(s) without angina pectoris: Secondary | ICD-10-CM | POA: Diagnosis not present

## 2014-01-28 DIAGNOSIS — E780 Pure hypercholesterolemia: Secondary | ICD-10-CM | POA: Diagnosis not present

## 2014-01-28 DIAGNOSIS — R7309 Other abnormal glucose: Secondary | ICD-10-CM | POA: Diagnosis not present

## 2014-02-04 DIAGNOSIS — E291 Testicular hypofunction: Secondary | ICD-10-CM | POA: Diagnosis not present

## 2014-02-04 DIAGNOSIS — R7309 Other abnormal glucose: Secondary | ICD-10-CM | POA: Diagnosis not present

## 2014-02-04 DIAGNOSIS — I10 Essential (primary) hypertension: Secondary | ICD-10-CM | POA: Diagnosis not present

## 2014-02-04 DIAGNOSIS — N400 Enlarged prostate without lower urinary tract symptoms: Secondary | ICD-10-CM | POA: Diagnosis not present

## 2014-02-04 DIAGNOSIS — E039 Hypothyroidism, unspecified: Secondary | ICD-10-CM | POA: Diagnosis not present

## 2014-02-04 DIAGNOSIS — E780 Pure hypercholesterolemia: Secondary | ICD-10-CM | POA: Diagnosis not present

## 2014-03-07 DIAGNOSIS — K1329 Other disturbances of oral epithelium, including tongue: Secondary | ICD-10-CM | POA: Diagnosis not present

## 2014-03-18 DIAGNOSIS — E039 Hypothyroidism, unspecified: Secondary | ICD-10-CM | POA: Diagnosis not present

## 2014-04-15 ENCOUNTER — Other Ambulatory Visit: Payer: Self-pay | Admitting: Dermatology

## 2014-04-15 DIAGNOSIS — Z85828 Personal history of other malignant neoplasm of skin: Secondary | ICD-10-CM | POA: Diagnosis not present

## 2014-04-15 DIAGNOSIS — D1801 Hemangioma of skin and subcutaneous tissue: Secondary | ICD-10-CM | POA: Diagnosis not present

## 2014-04-15 DIAGNOSIS — L821 Other seborrheic keratosis: Secondary | ICD-10-CM | POA: Diagnosis not present

## 2014-04-15 DIAGNOSIS — L578 Other skin changes due to chronic exposure to nonionizing radiation: Secondary | ICD-10-CM | POA: Diagnosis not present

## 2014-04-15 DIAGNOSIS — L738 Other specified follicular disorders: Secondary | ICD-10-CM | POA: Diagnosis not present

## 2014-04-15 DIAGNOSIS — D485 Neoplasm of uncertain behavior of skin: Secondary | ICD-10-CM | POA: Diagnosis not present

## 2014-04-15 DIAGNOSIS — L812 Freckles: Secondary | ICD-10-CM | POA: Diagnosis not present

## 2014-06-11 DIAGNOSIS — R7309 Other abnormal glucose: Secondary | ICD-10-CM | POA: Diagnosis not present

## 2014-06-11 DIAGNOSIS — F324 Major depressive disorder, single episode, in partial remission: Secondary | ICD-10-CM | POA: Diagnosis not present

## 2014-06-11 DIAGNOSIS — G4733 Obstructive sleep apnea (adult) (pediatric): Secondary | ICD-10-CM | POA: Diagnosis not present

## 2014-06-11 DIAGNOSIS — E039 Hypothyroidism, unspecified: Secondary | ICD-10-CM | POA: Diagnosis not present

## 2014-06-11 DIAGNOSIS — E780 Pure hypercholesterolemia: Secondary | ICD-10-CM | POA: Diagnosis not present

## 2014-06-11 DIAGNOSIS — I10 Essential (primary) hypertension: Secondary | ICD-10-CM | POA: Diagnosis not present

## 2014-06-11 DIAGNOSIS — Z1389 Encounter for screening for other disorder: Secondary | ICD-10-CM | POA: Diagnosis not present

## 2014-10-24 DIAGNOSIS — Z23 Encounter for immunization: Secondary | ICD-10-CM | POA: Diagnosis not present

## 2014-11-12 DIAGNOSIS — I4891 Unspecified atrial fibrillation: Secondary | ICD-10-CM | POA: Diagnosis not present

## 2014-11-12 DIAGNOSIS — R7309 Other abnormal glucose: Secondary | ICD-10-CM | POA: Diagnosis not present

## 2014-11-12 DIAGNOSIS — I25810 Atherosclerosis of coronary artery bypass graft(s) without angina pectoris: Secondary | ICD-10-CM | POA: Diagnosis not present

## 2014-11-12 DIAGNOSIS — I10 Essential (primary) hypertension: Secondary | ICD-10-CM | POA: Diagnosis not present

## 2014-11-12 DIAGNOSIS — E039 Hypothyroidism, unspecified: Secondary | ICD-10-CM | POA: Diagnosis not present

## 2014-11-12 NOTE — Progress Notes (Signed)
Cardiology Office Note   Date:  11/13/2014   ID:  BEACHER EVERY, DOB January 19, 1945, MRN [id number]  PCP:  Horton Finer, MD  Cardiologist:   Sharol Harness, MD   Chief Complaint  Patient presents with  . New Evaluation    no lightheadedness or dizziness  . Chest Pain    pt c/o no chest pain but states he has pain under the left arm  . Edema    no edema  . Shortness of Breath    no SOB but states lack of energy      History of Present Illness: KEVORK JOYCE is a 69 y.o. male with hypertension, CAD, hyperlipidemia and OSA non-compliant with CPAP who presents for management of atrial fibrillation.  Mr. Cullers saw [name] yesterday, where he was diagnosed with atrial fibrillation and started on Eliquis.  He has been feeling mostly well but has been feeling tired.  He also notes a tired feeling in his legs.  He denies palpitations but notes occasional twinges of chest pain with exertion. This is similar to one of the times when he needed a stent in the past.  These episodes occurred 3 times in the last month, which is increased from his baseline. There is no associated shortness of breath, nausea, vomiting or diaphoresis. The pain is under his left arm and across his left chest.  He commonly notices it when he is doing yard work. This is the most strenuous physical activity that he does. He denies significant shortness of breath.  He sleeps better in a recliner than laying in the bed.  He denies PND. He is noncompliant with his CPAP and his wife notes that he snores loudly.  Mr. Bohlman denies lower extremity edema. He has gained 10 pounds in the last year, though he attributes this to poor diet.  Mr. Steedman had several stents placed in the past (he thinks there were 7).  The last was placed approximately 10 years ago.  He subsequently unerwent CABG due to in-stent restenosis.  He was previously cared for by [name] at [place].   Past Medical History    Diagnosis Date  . Hypertension   . Coronary artery disease   . Depression   . Hyperlipidemia   . Arthritis   . Sleep apnea     CPAP 'Uses half the time"  last study 2009  . Cancer ([place])     skin  . Essential hypertension 11/13/2014  . Hyperlipidemia 11/13/2014    Past Surgical History  Procedure Laterality Date  . Coronary artery bypass graft  2006  . Coronary angioplasty      multiple stents  . Carpal tunnel release    . Eye surgery      right eye-  muscular repair  . Total hip arthroplasty  01/03/2012    Procedure: TOTAL HIP ARTHROPLASTY;  Surgeon: [name], MD;  Location: [facility];  Service: Orthopedics;  Laterality: Right;     Current Outpatient Prescriptions  Medication Sig Dispense Refill  . apixaban (ELIQUIS) 5 MG TABS tablet Take 5 mg by mouth 2 (two) times daily.    . carvedilol (COREG) 12.5 MG tablet Take 12.5 mg by mouth 2 (two) times daily with a meal.    . ezetimibe (ZETIA) 10 MG tablet Take 10 mg by mouth every evening.    Marland Kitchen levothyroxine (SYNTHROID, LEVOTHROID) 137 MCG tablet Take 1 tablet by mouth daily.    Marland Kitchen losartan (COZAAR) 25 MG  tablet Take 25 mg by mouth daily.    Marland Kitchen testosterone cypionate (DEPOTESTOSTERONE CYPIONATE) 200 MG/ML injection Inject 200 mg into the muscle every 14 (fourteen) days.    Marland Kitchen venlafaxine XR (EFFEXOR-XR) 150 MG 24 hr capsule Take 150 mg by mouth every evening.     No current facility-administered medications for this visit.    Allergies:   Review of patient's allergies indicates no known allergies.    Social History:  The patient  reports that he quit smoking about 45 years ago. His smoking use included Cigarettes. He has never used smokeless tobacco. He reports that he does not drink alcohol or use illicit drugs.   Family History:  The patient's family history includes Heart attack in his maternal grandfather, mother, paternal grandfather, and paternal grandmother; Other in his father; Stroke in his maternal grandmother.     ROS:  Please see the history of present illness.   Otherwise, review of systems are positive for none.   All other systems are reviewed and negative.    PHYSICAL EXAM: VS:  BP 124/84 mmHg  Pulse 66  Ht 5\' 10"  (1.778 m)  Wt 110.224 kg (243 lb)  BMI 34.87 kg/m2 , BMI Body mass index is 34.87 kg/(m^2). GENERAL:  Well appearing HEENT:  Pupils equal round and reactive, fundi not visualized, oral mucosa unremarkable NECK:  No jugular venous distention, waveform within normal limits, carotid upstroke brisk and symmetric, no bruits, no thyromegaly LYMPHATICS:  No cervical adenopathy LUNGS:  Clear to auscultation bilaterally HEART:  Irregularly irregular.  PMI not displaced or sustained,S1 and S2 within normal limits, no S3, no S4, no clicks, no rubs, no murmurs ABD:  Flat, positive bowel sounds normal in frequency in pitch, no bruits, no rebound, no guarding, no midline pulsatile mass, no hepatomegaly, no splenomegaly EXT:  2 plus pulses throughout, no edema, no cyanosis no clubbing SKIN:  No rashes no nodules NEURO:  Cranial nerves II through XII grossly intact, motor grossly intact throughout PSYCH:  Cognitively intact, oriented to person place and time    EKG:  EKG is ordered today. The ekg ordered today demonstrates atrial fibrillation, rate 66 bpm. Prior septal infarct.   Recent Labs: No results found for requested labs within last 365 days.    Lipid Panel No results found for: CHOL, TRIG, HDL, CHOLHDL, VLDL, LDLCALC, LDLDIRECT    Wt Readings from Last 3 Encounters:  11/13/14 110.224 kg (243 lb)  01/03/12 108.41 kg (239 lb)  12/24/11 108.41 kg (239 lb)      ASSESSMENT AND PLAN:  # Atrial fibrillation: Mr. Fults continues to be in atrial fibrillation today. His rate is well controlled and he was appropriately started on Eliquis. He does however seem to be symptomatic atrial fibrillation, as he reports fatigue. He discussed options for getting him back into sinus  rhythm. He would like to remain on Eliquis for 3 weeks. After that we will set him up for DC cardioversion.  We'll also order an echo, check his thyroid function, CBC, basic metabolic panel and magnesium.  Additionally I recommend that he use his CPAP machine more faithfully.   He will continue on Eliquis and carvedilol.  This patients CHA2DS2-VASc Score and unadjusted Ischemic Stroke Rate (% per year) is equal to 3.2 % stroke rate/year from a score of 3  Above score calculated as 1 point each if present [CHF, HTN, DM, Vascular=MI/PAD/Aortic Plaque, Age if 44-74, or Male.  Above score calculated as 2 points each if present [Age >  75, or Stroke/TIA/TE]  # Orthopnea:  Mr. Macintyre reports orthopnea and sleeping in a recliner.  He does not have significant lower extremity edema and his JVP is not elevated. We are obtaining an echo as above and we'll also check a BNP for evidence of heart failure.  # Chest pain/CAD s/p CABG and PCI: Mr. Digilio has atypical chest pain. However he does have a significant history of coronary artery disease including stenting and coronary artery bypass. Therefore we will refer him for Lexiscan Myoview  to evaluate for ischemia.  Continue carvedilol.  Aspirin was stopped after he was started on Eliquis yesterday. He is not on a statin. We did not address this today. He is on Zetia, which I presume is due to statin intolerance. We will discuss this at his follow-up appointment.  # Hypertension: Blood pressure well-controlled. Continue carvedilol and losartan.    to evaluate for ischemia.Current medicines are reviewed at length with the patient today.  The patient does not have concerns regarding medicines.  The following changes have been made:  no change  Labs/ tests ordered today include:   Orders Placed This Encounter  Procedures  . ELECTRICAL CARDIOVERSION  . Basic metabolic panel  . Brain natriuretic peptide  . TSH  . CBC  . Magnesium  . Protime-INR  . APTT   . CBC  . Basic metabolic panel  . Myocardial Perfusion Imaging  . ECHOCARDIOGRAM COMPLETE     Disposition:   FU with Desirai Traxler C. Oval Linsey, MD in 3 months   Signed, Sharol Harness, MD  11/13/2014 12:52 PM    [place]

## 2014-11-13 ENCOUNTER — Telehealth: Payer: Self-pay | Admitting: *Deleted

## 2014-11-13 ENCOUNTER — Encounter: Payer: Self-pay | Admitting: Cardiovascular Disease

## 2014-11-13 ENCOUNTER — Ambulatory Visit (INDEPENDENT_AMBULATORY_CARE_PROVIDER_SITE_OTHER): Payer: Medicare Other | Admitting: Cardiovascular Disease

## 2014-11-13 VITALS — BP 124/84 | HR 66 | Ht 70.0 in | Wt 243.0 lb

## 2014-11-13 DIAGNOSIS — R072 Precordial pain: Secondary | ICD-10-CM | POA: Diagnosis not present

## 2014-11-13 DIAGNOSIS — I480 Paroxysmal atrial fibrillation: Secondary | ICD-10-CM | POA: Insufficient documentation

## 2014-11-13 DIAGNOSIS — I25118 Atherosclerotic heart disease of native coronary artery with other forms of angina pectoris: Secondary | ICD-10-CM | POA: Diagnosis not present

## 2014-11-13 DIAGNOSIS — E785 Hyperlipidemia, unspecified: Secondary | ICD-10-CM

## 2014-11-13 DIAGNOSIS — I2510 Atherosclerotic heart disease of native coronary artery without angina pectoris: Secondary | ICD-10-CM | POA: Insufficient documentation

## 2014-11-13 DIAGNOSIS — I4891 Unspecified atrial fibrillation: Secondary | ICD-10-CM | POA: Diagnosis not present

## 2014-11-13 DIAGNOSIS — R0602 Shortness of breath: Secondary | ICD-10-CM | POA: Diagnosis not present

## 2014-11-13 DIAGNOSIS — I10 Essential (primary) hypertension: Secondary | ICD-10-CM

## 2014-11-13 HISTORY — DX: Unspecified atrial fibrillation: I48.91

## 2014-11-13 HISTORY — DX: Essential (primary) hypertension: I10

## 2014-11-13 HISTORY — DX: Hyperlipidemia, unspecified: E78.5

## 2014-11-13 LAB — CBC
HEMATOCRIT: 46.7 % (ref 39.0–52.0)
Hemoglobin: 15.6 g/dL (ref 13.0–17.0)
MCH: 27 pg (ref 26.0–34.0)
MCHC: 33.4 g/dL (ref 30.0–36.0)
MCV: 80.9 fL (ref 78.0–100.0)
MPV: 9.1 fL (ref 8.6–12.4)
Platelets: 401 10*3/uL — ABNORMAL HIGH (ref 150–400)
RBC: 5.77 MIL/uL (ref 4.22–5.81)
RDW: 15.4 % (ref 11.5–15.5)
WBC: 9.5 10*3/uL (ref 4.0–10.5)

## 2014-11-13 LAB — MAGNESIUM: Magnesium: 2.1 mg/dL (ref 1.5–2.5)

## 2014-11-13 LAB — BASIC METABOLIC PANEL
BUN: 14 mg/dL (ref 7–25)
CALCIUM: 9.6 mg/dL (ref 8.6–10.3)
CO2: 27 mmol/L (ref 20–31)
Chloride: 101 mmol/L (ref 98–110)
Creat: 0.89 mg/dL (ref 0.70–1.18)
GLUCOSE: 123 mg/dL — AB (ref 65–99)
Potassium: 4.8 mmol/L (ref 3.5–5.3)
Sodium: 139 mmol/L (ref 135–146)

## 2014-11-13 LAB — TSH: TSH: 0.749 u[IU]/mL (ref 0.350–4.500)

## 2014-11-13 NOTE — Telephone Encounter (Signed)
Spoke to patient  He is aware of cardioversion 12/06/2014. Lab work will be done 11/28/1/6

## 2014-11-13 NOTE — Patient Instructions (Addendum)
Your physician recommends that you schedule a follow-up appointment in [place]. CANCEL APPOINTMENT WITH DR Gillian Shields   Labs--BMP,MG CBC,TSH,BNP  Your physician has requested that you have an echocardiogram at [facility] [address]. Echocardiography is a painless test that uses sound waves to create images of your heart. It provides your doctor with information about the size and shape of your heart and how well your heart's chambers and valves are working. This procedure takes approximately one hour. There are no restrictions for this procedure.  Your physician has requested that you have a lexiscan myoview. For further information please visit [email or web address]. Please follow instruction sheet, as given.   Schedule in 3 weeks-Your physician has recommended that you have a Cardioversion (DCCV). Electrical Cardioversion uses a jolt of electricity to your heart either through paddles or wired patches attached to your chest. This is a controlled, usually prescheduled, procedure. Defibrillation is done under light anesthesia in the hospital, and you usually go home the day of the procedure. This is done to get your heart back into a normal rhythm. You are not awake for the procedure. Please see the instruction sheet given to you today.   If you need a refill on your cardiac medications before your next appointment, please call your pharmacy.

## 2014-11-14 ENCOUNTER — Encounter: Payer: Self-pay | Admitting: Cardiovascular Disease

## 2014-11-14 LAB — BRAIN NATRIURETIC PEPTIDE: Brain Natriuretic Peptide: 242.6 pg/mL — ABNORMAL HIGH (ref 0.0–100.0)

## 2014-11-18 ENCOUNTER — Other Ambulatory Visit: Payer: Self-pay | Admitting: *Deleted

## 2014-11-18 DIAGNOSIS — Z01818 Encounter for other preprocedural examination: Secondary | ICD-10-CM

## 2014-11-20 ENCOUNTER — Ambulatory Visit (HOSPITAL_COMMUNITY): Payer: Medicare Other | Attending: Cardiovascular Disease

## 2014-11-20 ENCOUNTER — Other Ambulatory Visit: Payer: Self-pay

## 2014-11-20 ENCOUNTER — Telehealth: Payer: Self-pay | Admitting: *Deleted

## 2014-11-20 DIAGNOSIS — I4891 Unspecified atrial fibrillation: Secondary | ICD-10-CM | POA: Diagnosis not present

## 2014-11-20 DIAGNOSIS — I517 Cardiomegaly: Secondary | ICD-10-CM | POA: Insufficient documentation

## 2014-11-20 DIAGNOSIS — R0600 Dyspnea, unspecified: Secondary | ICD-10-CM | POA: Diagnosis present

## 2014-11-20 DIAGNOSIS — Z87891 Personal history of nicotine dependence: Secondary | ICD-10-CM | POA: Diagnosis not present

## 2014-11-20 DIAGNOSIS — I77810 Thoracic aortic ectasia: Secondary | ICD-10-CM | POA: Insufficient documentation

## 2014-11-20 DIAGNOSIS — E785 Hyperlipidemia, unspecified: Secondary | ICD-10-CM | POA: Insufficient documentation

## 2014-11-20 DIAGNOSIS — I10 Essential (primary) hypertension: Secondary | ICD-10-CM | POA: Insufficient documentation

## 2014-11-20 DIAGNOSIS — R0602 Shortness of breath: Secondary | ICD-10-CM | POA: Diagnosis not present

## 2014-11-20 DIAGNOSIS — R072 Precordial pain: Secondary | ICD-10-CM | POA: Diagnosis not present

## 2014-11-20 MED ORDER — FUROSEMIDE 20 MG PO TABS: 20.0000 mg | ORAL_TABLET | Freq: Every day | ORAL | Status: DC | PRN

## 2014-11-20 NOTE — Telephone Encounter (Signed)
Spoke to patient. Result given . Verbalized understanding\  AWARE OF HOW TO TAKE FUROSEMIDE. PATIENT WAS NOT FASTING WHEN LABS WERE DONE ROUTED TO PCP

## 2014-11-20 NOTE — Telephone Encounter (Signed)
-----   Message from [name], MD sent at 11/20/2014  9:03 AM EST ----- Labs show that he has heart failure.  Lasix 20 mg daily for 3 days.  Then daily as needed if his weight increases by more than 2 lb in a day or 5 lb in a week.  Blood glucose is elevated.  PLease follow up with PCP.

## 2014-11-22 ENCOUNTER — Telehealth (HOSPITAL_COMMUNITY): Payer: Self-pay

## 2014-11-22 NOTE — Telephone Encounter (Signed)
Encounter complete. 

## 2014-11-25 DIAGNOSIS — L570 Actinic keratosis: Secondary | ICD-10-CM | POA: Diagnosis not present

## 2014-11-25 DIAGNOSIS — Z85828 Personal history of other malignant neoplasm of skin: Secondary | ICD-10-CM | POA: Diagnosis not present

## 2014-11-26 ENCOUNTER — Telehealth (HOSPITAL_COMMUNITY): Payer: Self-pay

## 2014-11-26 NOTE — Telephone Encounter (Signed)
Encounter complete. 

## 2014-11-27 ENCOUNTER — Ambulatory Visit (HOSPITAL_COMMUNITY)
Admission: RE | Admit: 2014-11-27 | Discharge: 2014-11-27 | Disposition: A | Discharge status: Home / Self Care | Payer: Medicare Other | Source: Ambulatory Visit | Attending: Cardiovascular Disease | Admitting: Cardiovascular Disease

## 2014-11-27 DIAGNOSIS — R0602 Shortness of breath: Secondary | ICD-10-CM | POA: Insufficient documentation

## 2014-11-27 DIAGNOSIS — Z8249 Family history of ischemic heart disease and other diseases of the circulatory system: Secondary | ICD-10-CM | POA: Diagnosis not present

## 2014-11-27 DIAGNOSIS — R072 Precordial pain: Secondary | ICD-10-CM | POA: Diagnosis not present

## 2014-11-27 DIAGNOSIS — I10 Essential (primary) hypertension: Secondary | ICD-10-CM | POA: Insufficient documentation

## 2014-11-27 DIAGNOSIS — R002 Palpitations: Secondary | ICD-10-CM | POA: Insufficient documentation

## 2014-11-27 DIAGNOSIS — E669 Obesity, unspecified: Secondary | ICD-10-CM | POA: Insufficient documentation

## 2014-11-27 DIAGNOSIS — Z87891 Personal history of nicotine dependence: Secondary | ICD-10-CM | POA: Insufficient documentation

## 2014-11-27 DIAGNOSIS — Z6834 Body mass index (BMI) 34.0-34.9, adult: Secondary | ICD-10-CM | POA: Diagnosis not present

## 2014-11-27 DIAGNOSIS — I4891 Unspecified atrial fibrillation: Secondary | ICD-10-CM

## 2014-11-27 DIAGNOSIS — R5383 Other fatigue: Secondary | ICD-10-CM | POA: Diagnosis not present

## 2014-11-27 DIAGNOSIS — R0609 Other forms of dyspnea: Secondary | ICD-10-CM | POA: Insufficient documentation

## 2014-11-27 LAB — MYOCARDIAL PERFUSION IMAGING
CHL CUP RESTING HR STRESS: 71 {beats}/min
CSEPPHR: 86 {beats}/min
NUC STRESS TID: 1.23
SDS: 1
SRS: 5
SSS: 6

## 2014-11-27 MED ORDER — TECHNETIUM TC 99M SESTAMIBI GENERIC - CARDIOLITE
10.9000 | Freq: Once | INTRAVENOUS | Status: AC | PRN
Start: 2014-11-27 — End: 2014-11-27
  Administered 2014-11-27: 10.9 via INTRAVENOUS

## 2014-11-27 MED ORDER — TECHNETIUM TC 99M SESTAMIBI GENERIC - CARDIOLITE
30.4000 | Freq: Once | INTRAVENOUS | Status: AC | PRN
  Administered 2014-11-27: 30.4 via INTRAVENOUS

## 2014-11-27 MED ORDER — REGADENOSON 0.4 MG/5ML IV SOLN
0.4000 mg | Freq: Once | INTRAVENOUS | Status: AC
  Administered 2014-11-27: 0.4 mg via INTRAVENOUS

## 2014-11-27 MED ORDER — APIXABAN 5 MG PO TABS: 5.0000 mg | ORAL_TABLET | Freq: Two times a day (BID) | ORAL | Status: DC

## 2014-11-27 NOTE — Telephone Encounter (Signed)
Spoke to patient.  Both Results given . Verbalized understanding  patient also request samples of ELIQUIS Informed patient need to continue with medication without any interruption Patient verbalized  Understanding.

## 2014-11-27 NOTE — Telephone Encounter (Signed)
-----   Message from [name], MD sent at 11/27/2014  1:56 PM EST ----- Low risk stress test.

## 2014-11-27 NOTE — Addendum Note (Signed)
Addended by: Janett Labella A on: 11/27/2014 08:18 AM   Modules accepted: Orders

## 2014-12-02 DIAGNOSIS — R0602 Shortness of breath: Secondary | ICD-10-CM | POA: Diagnosis not present

## 2014-12-02 DIAGNOSIS — I4891 Unspecified atrial fibrillation: Secondary | ICD-10-CM | POA: Diagnosis not present

## 2014-12-02 LAB — BASIC METABOLIC PANEL
BUN: 17 mg/dL (ref 7–25)
CHLORIDE: 103 mmol/L (ref 98–110)
CO2: 28 mmol/L (ref 20–31)
Calcium: 9.5 mg/dL (ref 8.6–10.3)
Creat: 0.79 mg/dL (ref 0.70–1.18)
GLUCOSE: 126 mg/dL — AB (ref 65–99)
POTASSIUM: 5 mmol/L (ref 3.5–5.3)
Sodium: 139 mmol/L (ref 135–146)

## 2014-12-02 LAB — CBC
HEMATOCRIT: 46.1 % (ref 39.0–52.0)
HEMOGLOBIN: 15.3 g/dL (ref 13.0–17.0)
MCH: 27.3 pg (ref 26.0–34.0)
MCHC: 33.2 g/dL (ref 30.0–36.0)
MCV: 82.2 fL (ref 78.0–100.0)
MPV: 9.1 fL (ref 8.6–12.4)
Platelets: 357 10*3/uL (ref 150–400)
RBC: 5.61 MIL/uL (ref 4.22–5.81)
RDW: 14.7 % (ref 11.5–15.5)
WBC: 9.1 10*3/uL (ref 4.0–10.5)

## 2014-12-02 LAB — PROTIME-INR
INR: 1.12 (ref <1.50)
Prothrombin Time: 14.5 seconds (ref 11.6–15.2)

## 2014-12-02 LAB — APTT: aPTT: 41 seconds — ABNORMAL HIGH (ref 24–37)

## 2014-12-05 ENCOUNTER — Telehealth: Payer: Self-pay | Admitting: *Deleted

## 2014-12-05 MED ORDER — APIXABAN 5 MG PO TABS: 5.0000 mg | ORAL_TABLET | Freq: Two times a day (BID) | ORAL | Status: DC

## 2014-12-05 NOTE — Telephone Encounter (Signed)
E-sent 90 day supply Eliquis  #180  confirmed with Dr Oval Linsey- will be on medication indefinitely

## 2014-12-06 ENCOUNTER — Ambulatory Visit (HOSPITAL_COMMUNITY)
Admission: RE | Admit: 2014-12-06 | Discharge: 2014-12-06 | Disposition: A | Discharge status: Home / Self Care | Payer: Medicare Other | Source: Ambulatory Visit | Attending: Cardiovascular Disease | Admitting: Cardiovascular Disease

## 2014-12-06 ENCOUNTER — Ambulatory Visit (HOSPITAL_COMMUNITY): Payer: Medicare Other | Admitting: Certified Registered Nurse Anesthetist

## 2014-12-06 ENCOUNTER — Encounter (HOSPITAL_COMMUNITY): Payer: Self-pay | Admitting: Certified Registered Nurse Anesthetist

## 2014-12-06 ENCOUNTER — Telehealth: Payer: Self-pay | Admitting: *Deleted

## 2014-12-06 ENCOUNTER — Encounter (HOSPITAL_COMMUNITY)
Admission: RE | Disposition: A | Discharge status: Home / Self Care | Payer: Self-pay | Source: Ambulatory Visit | Attending: Cardiovascular Disease

## 2014-12-06 DIAGNOSIS — Z79899 Other long term (current) drug therapy: Secondary | ICD-10-CM | POA: Insufficient documentation

## 2014-12-06 DIAGNOSIS — Z951 Presence of aortocoronary bypass graft: Secondary | ICD-10-CM | POA: Diagnosis not present

## 2014-12-06 DIAGNOSIS — I10 Essential (primary) hypertension: Secondary | ICD-10-CM | POA: Diagnosis not present

## 2014-12-06 DIAGNOSIS — F329 Major depressive disorder, single episode, unspecified: Secondary | ICD-10-CM | POA: Diagnosis not present

## 2014-12-06 DIAGNOSIS — Z96641 Presence of right artificial hip joint: Secondary | ICD-10-CM | POA: Insufficient documentation

## 2014-12-06 DIAGNOSIS — E785 Hyperlipidemia, unspecified: Secondary | ICD-10-CM | POA: Diagnosis not present

## 2014-12-06 DIAGNOSIS — Z85828 Personal history of other malignant neoplasm of skin: Secondary | ICD-10-CM | POA: Insufficient documentation

## 2014-12-06 DIAGNOSIS — Z01818 Encounter for other preprocedural examination: Secondary | ICD-10-CM

## 2014-12-06 DIAGNOSIS — I2510 Atherosclerotic heart disease of native coronary artery without angina pectoris: Secondary | ICD-10-CM | POA: Insufficient documentation

## 2014-12-06 DIAGNOSIS — M1990 Unspecified osteoarthritis, unspecified site: Secondary | ICD-10-CM | POA: Diagnosis not present

## 2014-12-06 DIAGNOSIS — G4733 Obstructive sleep apnea (adult) (pediatric): Secondary | ICD-10-CM | POA: Insufficient documentation

## 2014-12-06 DIAGNOSIS — I4891 Unspecified atrial fibrillation: Secondary | ICD-10-CM | POA: Insufficient documentation

## 2014-12-06 HISTORY — PX: CARDIOVERSION: SHX1299

## 2014-12-06 SURGERY — CARDIOVERSION
Anesthesia: Monitor Anesthesia Care

## 2014-12-06 MED ORDER — LIDOCAINE HCL (CARDIAC) 20 MG/ML IV SOLN
INTRAVENOUS | Status: DC | PRN
  Administered 2014-12-06: 60 mg via INTRAVENOUS

## 2014-12-06 MED ORDER — HYDROCORTISONE 1 % EX CREA: 1.0000 "application " | TOPICAL_CREAM | Freq: Three times a day (TID) | CUTANEOUS | Status: DC | PRN

## 2014-12-06 MED ORDER — SODIUM CHLORIDE 0.9 % IJ SOLN: 3.0000 mL | Freq: Two times a day (BID) | INTRAMUSCULAR | Status: DC

## 2014-12-06 MED ORDER — SODIUM CHLORIDE 0.9 % IV SOLN
INTRAVENOUS | Status: DC
  Administered 2014-12-06: 12:00:00 via INTRAVENOUS

## 2014-12-06 MED ORDER — PROPOFOL 10 MG/ML IV BOLUS
INTRAVENOUS | Status: DC | PRN
  Administered 2014-12-06: 30 mg via INTRAVENOUS
  Administered 2014-12-06: 70 mg via INTRAVENOUS

## 2014-12-06 NOTE — Discharge Instructions (Signed)
Electrical Cardioversion, Care After °Refer to this sheet in the next few weeks. These instructions provide you with information on caring for yourself after your procedure. Your health care provider may also give you more specific instructions. Your treatment has been planned according to current medical practices, but problems sometimes occur. Call your health care provider if you have any problems or questions after your procedure. °WHAT TO EXPECT AFTER THE PROCEDURE °After your procedure, it is typical to have the following sensations: °· Some redness on the skin where the shocks were delivered. If this is tender, a sunburn lotion or hydrocortisone cream may help. °· Possible return of an abnormal heart rhythm within hours or days after the procedure. °HOME CARE INSTRUCTIONS °· Take medicines only as directed by your health care provider. Be sure you understand how and when to take your medicine. °· Learn how to feel your pulse and check it often. °· Limit your activity for 48 hours after the procedure or as directed by your health care provider. °· Avoid or minimize caffeine and other stimulants as directed by your health care provider. °SEEK MEDICAL CARE IF: °· You feel like your heart is beating too fast or your pulse is not regular. °· You have any questions about your medicines. °· You have bleeding that will not stop. °SEEK IMMEDIATE MEDICAL CARE IF: °· You are dizzy or feel faint. °· It is hard to breathe or you feel short of breath. °· There is a change in discomfort in your chest. °· Your speech is slurred or you have trouble moving an arm or leg on one side of your body. °· You get a serious muscle cramp that does not go away. °· Your fingers or toes turn cold or blue. °  °This information is not intended to replace advice given to you by your health care provider. Make sure you discuss any questions you have with your health care provider. °  °Document Released: 10/11/2012 Document Revised: 01/11/2014  Document Reviewed: 10/11/2012 °Elsevier Interactive Patient Education ©2016 [employer] Inc[employer] ° °

## 2014-12-06 NOTE — Telephone Encounter (Signed)
-----   Message from [name], MD sent at 12/06/2014  1:14 PM EST ----- Manley Mason,  I just cardioverted Mr. Jani today.  He is unable to afford Eliquis.  He has Medicare but no private insurance for medications.  Do you know if any of the other NOACs will be any more affordable?  If not, we need to bring him in to start warfarin.  I'd like to at least get him one month of NOAC to ensure he is adequately anticoagulated post cardioversion.  He has 2 weeks of samples and may need 2 more.  Lets have him come to clinic within 2 weeks to discuss.  Thanks, [employer]

## 2014-12-06 NOTE — CV Procedure (Signed)
Electrical Cardioversion Procedure Note Gary DUBOW [id number] May 08, 1945  Procedure: Electrical Cardioversion Indications:  Atrial Fibrillation  Procedure Details Consent: Risks of procedure as well as the alternatives and risks of each were explained to the (patient/caregiver).  Consent for procedure obtained. Time Out: Verified patient identification, verified procedure, site/side was marked, verified correct patient position, special equipment/implants available, medications/allergies/relevent history reviewed, required imaging and test results available.  Performed  Patient placed on cardiac monitor, pulse oximetry, supplemental oxygen as necessary.  Sedation given: Propofol Pacer pads placed anterior and posterior chest.  Cardioverted 1 time(s).  Cardioverted at 150J.  Evaluation Findings: Post procedure EKG shows: NSR Complications: None Patient did tolerate procedure well.   Sharol Harness, MD 12/06/2014, 1:12 PM

## 2014-12-06 NOTE — H&P (View-Only) (Signed)
  Cardiology Office Note   Date:  11/13/2014   ID:  Gary [name], DOB 02/11/1945, MRN [id number]  PCP:  OSBORNE,JAMES CHARLES, MD  Cardiologist:   [facility], Bransen Fassnacht P, MD   Chief Complaint  Patient presents with  . New Evaluation    no lightheadedness or dizziness  . Chest Pain    pt c/o no chest pain but states he has pain under the left arm  . Edema    no edema  . Shortness of Breath    no SOB but states lack of energy      History of Present Illness: Gary [name] is a 69 y.o. male with hypertension, CAD, hyperlipidemia and OSA non-compliant with CPAP who presents for management of atrial fibrillation.  Gary [name] saw Dr. Osborne yesterday, where he was diagnosed with atrial fibrillation and started on Eliquis.  He has been feeling mostly well but has been feeling tired.  He also notes a tired feeling in his legs.  He denies palpitations but notes occasional twinges of chest pain with exertion. This is similar to one of the times when he needed a stent in the past.  These episodes occurred 3 times in the last month, which is increased from his baseline. There is no associated shortness of breath, nausea, vomiting or diaphoresis. The pain is under his left arm and across his left chest.  He commonly notices it when he is doing yard work. This is the most strenuous physical activity that he does. He denies significant shortness of breath.  He sleeps better in a recliner than laying in the bed.  He denies PND. He is noncompliant with his CPAP and his wife notes that he snores loudly.  Gary [name] denies lower extremity edema. He has gained 10 pounds in the last year, though he attributes this to poor diet.  Gary [name] had several stents placed in the past (he thinks there were 7).  The last was placed approximately 10 years ago.  He subsequently unerwent CABG due to in-stent restenosis.  He was previously cared for by Dr. Edmond at [facility].   Past Medical History    Diagnosis Date  . Hypertension   . Coronary artery disease   . Depression   . Hyperlipidemia   . Arthritis   . Sleep apnea     CPAP 'Uses half the time"  last study 2009  . Cancer ([facility])     skin  . Essential hypertension 11/13/2014  . Hyperlipidemia 11/13/2014    Past Surgical History  Procedure Laterality Date  . Coronary artery bypass graft  2006  . Coronary angioplasty      multiple stents  . Carpal tunnel release    . Eye surgery      right eye-  muscular repair  . Total hip arthroplasty  01/03/2012    Procedure: TOTAL HIP ARTHROPLASTY;  Surgeon: Ronald A Gioffre, MD;  Location: [facility];  Service: Orthopedics;  Laterality: Right;     Current Outpatient Prescriptions  Medication Sig Dispense Refill  . apixaban (ELIQUIS) 5 MG TABS tablet Take 5 mg by mouth 2 (two) times daily.    . carvedilol (COREG) 12.5 MG tablet Take 12.5 mg by mouth 2 (two) times daily with a meal.    . ezetimibe (ZETIA) 10 MG tablet Take 10 mg by mouth every evening.    . levothyroxine (SYNTHROID, LEVOTHROID) 137 MCG tablet Take 1 tablet by mouth daily.    . losartan (COZAAR) 25 MG   tablet Take 25 mg by mouth daily.    . testosterone cypionate (DEPOTESTOSTERONE CYPIONATE) 200 MG/ML injection Inject 200 mg into the muscle every 14 (fourteen) days.    . venlafaxine XR (EFFEXOR-XR) 150 MG 24 hr capsule Take 150 mg by mouth every evening.     No current facility-administered medications for this visit.    Allergies:   Review of patient's allergies indicates no known allergies.    Social History:  The patient  reports that he quit smoking about 45 years ago. His smoking use included Cigarettes. He has never used smokeless tobacco. He reports that he does not drink alcohol or use illicit drugs.   Family History:  The patient's family history includes Heart attack in his maternal grandfather, mother, paternal grandfather, and paternal grandmother; Other in his father; Stroke in his maternal grandmother.     ROS:  Please see the history of present illness.   Otherwise, review of systems are positive for none.   All other systems are reviewed and negative.    PHYSICAL EXAM: VS:  BP 124/84 mmHg  Pulse 66  Ht 5' 10" (1.778 m)  Wt 110.224 kg (243 lb)  BMI 34.87 kg/m2 , BMI Body mass index is 34.87 kg/(m^2). GENERAL:  Well appearing HEENT:  Pupils equal round and reactive, fundi not visualized, oral mucosa unremarkable NECK:  No jugular venous distention, waveform within normal limits, carotid upstroke brisk and symmetric, no bruits, no thyromegaly LYMPHATICS:  No cervical adenopathy LUNGS:  Clear to auscultation bilaterally HEART:  Irregularly irregular.  PMI not displaced or sustained,S1 and S2 within normal limits, no S3, no S4, no clicks, no rubs, no murmurs ABD:  Flat, positive bowel sounds normal in frequency in pitch, no bruits, no rebound, no guarding, no midline pulsatile mass, no hepatomegaly, no splenomegaly EXT:  2 plus pulses throughout, no edema, no cyanosis no clubbing SKIN:  No rashes no nodules NEURO:  Cranial nerves II through XII grossly intact, motor grossly intact throughout PSYCH:  Cognitively intact, oriented to person place and time    EKG:  EKG is ordered today. The ekg ordered today demonstrates atrial fibrillation, rate 66 bpm. Prior septal infarct.   Recent Labs: No results found for requested labs within last 365 days.    Lipid Panel No results found for: CHOL, TRIG, HDL, CHOLHDL, VLDL, LDLCALC, LDLDIRECT    Wt Readings from Last 3 Encounters:  11/13/14 110.224 kg (243 lb)  01/03/12 108.41 kg (239 lb)  12/24/11 108.41 kg (239 lb)      ASSESSMENT AND PLAN:  # Atrial fibrillation: Gary [name] continues to be in atrial fibrillation today. His rate is well controlled and he was appropriately started on Eliquis. He does however seem to be symptomatic atrial fibrillation, as he reports fatigue. He discussed options for getting him back into sinus  rhythm. He would like to remain on Eliquis for 3 weeks. After that we will set him up for DC cardioversion.  We'll also order an echo, check his thyroid function, CBC, basic metabolic panel and magnesium.  Additionally I recommend that he use his CPAP machine more faithfully.   He will continue on Eliquis and carvedilol.  This patients CHA2DS2-VASc Score and unadjusted Ischemic Stroke Rate (% per year) is equal to 3.2 % stroke rate/year from a score of 3  Above score calculated as 1 point each if present [CHF, HTN, DM, Vascular=MI/PAD/Aortic Plaque, Age if 65-74, or Male.  Above score calculated as 2 points each if present [Age >   75, or Stroke/TIA/TE]  # Orthopnea:  Gary [name] reports orthopnea and sleeping in a recliner.  He does not have significant lower extremity edema and his JVP is not elevated. We are obtaining an echo as above and we'll also check a BNP for evidence of heart failure.  # Chest pain/CAD s/p CABG and PCI: Gary [name] has atypical chest pain. However he does have a significant history of coronary artery disease including stenting and coronary artery bypass. Therefore we will refer him for Lexiscan Myoview  to evaluate for ischemia.  Continue carvedilol.  Aspirin was stopped after he was started on Eliquis yesterday. He is not on a statin. We did not address this today. He is on Zetia, which I presume is due to statin intolerance. We will discuss this at his follow-up appointment.  # Hypertension: Blood pressure well-controlled. Continue carvedilol and losartan.    to evaluate for ischemia.Current medicines are reviewed at length with the patient today.  The patient does not have concerns regarding medicines.  The following changes have been made:  no change  Labs/ tests ordered today include:   Orders Placed This Encounter  Procedures  . ELECTRICAL CARDIOVERSION  . Basic metabolic panel  . Brain natriuretic peptide  . TSH  . CBC  . Magnesium  . Protime-INR  . APTT   . CBC  . Basic metabolic panel  . Myocardial Perfusion Imaging  . ECHOCARDIOGRAM COMPLETE     Disposition:   FU with Alisyn Lequire C. Cricket, MD in 3 months   Signed, [place], Rhyleigh Grassel P, MD  11/13/2014 12:52 PM    [facility]  

## 2014-12-06 NOTE — Transfer of Care (Addendum)
Immediate Anesthesia Transfer of Care Note  Patient: Gary [name]  Procedure(s) Performed: Procedure(s): CARDIOVERSION (N/A)  Patient Location: Endoscopy Unit  Anesthesia Type:MAC  Level of Consciousness: awake, alert , oriented and patient cooperative  Airway & Oxygen Therapy: Patient Spontanous Breathing  Post-op Assessment: Report given to RN and Post -op Vital signs reviewed and stable  Post vital signs: Reviewed and stable  Last Vitals:  Filed Vitals:   12/06/14 1308 12/06/14 1311  BP:    Pulse: 76 73  Temp:  36.6 C  Resp: 19 16    Complications: No apparent anesthesia complications

## 2014-12-06 NOTE — Interval H&P Note (Signed)
History and Physical Interval Note:  12/06/2014 1:11 PM  Gary [name]  has presented today for surgery, with the diagnosis of AFIB  The various methods of treatment have been discussed with the patient and family. After consideration of risks, benefits and other options for treatment, the patient has consented to  Procedure(s): CARDIOVERSION (N/A) as a surgical intervention .  The patient's history has been reviewed, patient examined, no change in status, stable for surgery.  I have reviewed the patient's chart and labs.  Questions were answered to the patient's satisfaction.     Sharol Harness, MD

## 2014-12-06 NOTE — Telephone Encounter (Signed)
SPOKE TO PATIENT APPOINTMENT MADE TO DISCUSS WITH Gary [name] PATIENT UNABLE TO AFFORD ELIQIUS - $1400 FOR 3 MONTH SUPPLY - HIT THE "DOUNT HOLE" PATIENT HAS ENOUGH MEDICATION UNTIL 12/20/14 APPOINTMENT. APPOINTMENT ALSO SCHEDULE WITH DR [facility] 12/116/16 PATIENT VERBALIZED UNDERSTANDING.

## 2014-12-06 NOTE — Anesthesia Preprocedure Evaluation (Signed)
Anesthesia Evaluation  Patient identified by MRN, date of birth, ID band Patient awake    Reviewed: Allergy & Precautions, NPO status , Patient's Chart, lab work & pertinent test results  Airway Mallampati: II   Neck ROM: full    Dental   Pulmonary sleep apnea , former smoker,    breath sounds clear to auscultation       Cardiovascular hypertension, + CAD   Rhythm:irregular Rate:Normal     Neuro/Psych PSYCHIATRIC DISORDERS Depression    GI/Hepatic   Endo/Other  Obese.  Renal/GU      Musculoskeletal  (+) Arthritis ,   Abdominal   Peds  Hematology   Anesthesia Other Findings   Reproductive/Obstetrics                             Anesthesia Physical Anesthesia Plan  ASA: III  Anesthesia Plan: MAC   Post-op Pain Management:    Induction: Intravenous  Airway Management Planned: Mask  Additional Equipment:   Intra-op Plan:   Post-operative Plan:   Informed Consent: I have reviewed the patients History and Physical, chart, labs and discussed the procedure including the risks, benefits and alternatives for the proposed anesthesia with the patient or authorized representative who has indicated his/her understanding and acceptance.     Plan Discussed with: CRNA, Anesthesiologist and Surgeon  Anesthesia Plan Comments:         Anesthesia Quick Evaluation

## 2014-12-09 ENCOUNTER — Encounter (HOSPITAL_COMMUNITY): Payer: Self-pay | Admitting: Cardiovascular Disease

## 2014-12-09 NOTE — Anesthesia Postprocedure Evaluation (Signed)
Anesthesia Post Note  Patient: Gary [name]  Procedure(s) Performed: Procedure(s) (LRB): CARDIOVERSION (N/A)  Patient location during evaluation: PACU Anesthesia Type: MAC Level of consciousness: awake and alert Pain management: pain level controlled Vital Signs Assessment: post-procedure vital signs reviewed and stable Respiratory status: spontaneous breathing, nonlabored ventilation, respiratory function stable and patient connected to nasal cannula oxygen Cardiovascular status: stable and blood pressure returned to baseline Anesthetic complications: no    Last Vitals:  Filed Vitals:   12/06/14 1311 12/06/14 1320  BP:  132/71  Pulse: 73 76  Temp: 36.6 C   Resp: 16 19    Last Pain: There were no vitals filed for this visit.               [place]

## 2014-12-20 ENCOUNTER — Encounter: Payer: Self-pay | Admitting: Pharmacist Clinician (PhC)/ Clinical Pharmacy Specialist

## 2014-12-20 ENCOUNTER — Encounter: Payer: Self-pay | Admitting: Physician Assistant

## 2014-12-20 ENCOUNTER — Ambulatory Visit (INDEPENDENT_AMBULATORY_CARE_PROVIDER_SITE_OTHER): Payer: Medicare Other | Admitting: Pharmacist Clinician (PhC)/ Clinical Pharmacy Specialist

## 2014-12-20 ENCOUNTER — Ambulatory Visit (INDEPENDENT_AMBULATORY_CARE_PROVIDER_SITE_OTHER): Payer: Medicare Other | Admitting: Physician Assistant

## 2014-12-20 ENCOUNTER — Ambulatory Visit: Payer: Medicare Other | Admitting: Cardiovascular Disease

## 2014-12-20 VITALS — BP 150/84 | HR 66 | Ht 70.0 in | Wt 236.5 lb

## 2014-12-20 DIAGNOSIS — I4891 Unspecified atrial fibrillation: Secondary | ICD-10-CM

## 2014-12-20 DIAGNOSIS — I2510 Atherosclerotic heart disease of native coronary artery without angina pectoris: Secondary | ICD-10-CM

## 2014-12-20 DIAGNOSIS — E785 Hyperlipidemia, unspecified: Secondary | ICD-10-CM | POA: Diagnosis not present

## 2014-12-20 DIAGNOSIS — I10 Essential (primary) hypertension: Secondary | ICD-10-CM | POA: Diagnosis not present

## 2014-12-20 DIAGNOSIS — I480 Paroxysmal atrial fibrillation: Secondary | ICD-10-CM

## 2014-12-20 DIAGNOSIS — I2583 Coronary atherosclerosis due to lipid rich plaque: Secondary | ICD-10-CM

## 2014-12-20 NOTE — Progress Notes (Signed)
Pt in for OV with [name] post DCCV.  Has been on Eliquis, but is in donut hole with insurance, copay would be $1400 for 90 day supply.  We will give him samples x 4 weeks, then he can re-order in January thru his insurance.  He is looking to get several of his generic medications thur [facility] next year, to see if that will help him avoid the donut hole later in 2017.  Advised that he call and we can send those prescriptions to [facility] in January.

## 2014-12-20 NOTE — Patient Instructions (Signed)
Keep scheduled appointment with Dr. Oval Linsey on February 10th. No changes were made today in your therapy.

## 2014-12-20 NOTE — Progress Notes (Signed)
Patient ID: Gary [name], male   DOB: April 24, 1945, 69 y.o.   MRN: [id number]    Date:  12/20/2014   ID:  Gary [name], DOB Nov 30, 1945, MRN [id number]  PCP:  [name], MD  Primary Cardiologist:   Oval Linsey  Chief Complaint  Patient presents with  . Follow-up    post cardioversion//pt states no Sx.     History of Present Illness: Gary [name] is a 69 y.o. male with hypertension, CAD, hyperlipidemia and OSA non-compliant with CPAP who presented for management of atrial fibrillation on Nov 9. Gary [name] saw Gary [name] Nov 8, where he was diagnosed with atrial fibrillation and started on Eliquis.  He is noncompliant with his CPAP and his wife notes that he snores loudly.  Gary [name] had several stents placed in the past (he thinks there were 7). The last was placed approximately 10 years ago. He subsequently unerwent CABG due to in-stent restenosis.    patient underwent DC CV  On 12/06/2014.Is here for follow-up today and is maintaing sinus rhytm on his EKG.  He also underwent a stress test which revealed a small size, moderate intensity fixed inferior lateral wall defect. There was no significant reversible ischemia stress test was considered low risk.   According to our scale the patient has lost 9 pounds since his last visit.   He feels well and has no particular complaints.  He denies nausea, vomiting, fever, chest pain, shortness of breath, orthopnea, dizziness, PND, cough, congestion, abdominal pain, hematochezia, melena, lower extremity edema.  Wt Readings from Last 3 Encounters:  12/20/14 236 lb 8 oz (107.276 kg)  12/06/14 234 lb (106.142 kg)  11/27/14 243 lb (110.224 kg)     Past Medical History  Diagnosis Date  . Hypertension   . Coronary artery disease   . Depression   . Hyperlipidemia   . Arthritis   . Sleep apnea     CPAP 'Uses half the time"  last study 2009  . Cancer ([place])     skin  . Essential hypertension 11/13/2014  . Hyperlipidemia 11/13/2014    . Atrial fibrillation ([place]) 11/13/2014    Current Outpatient Prescriptions  Medication Sig Dispense Refill  . apixaban (ELIQUIS) 5 MG TABS tablet Take 1 tablet (5 mg total) by mouth 2 (two) times daily. 180 tablet 3  . APPLE CIDER VINEGAR PO Take 625 mg by mouth daily.    Marland Kitchen CALCIUM-MAGNESIUM-ZINC PO Take 1 tablet by mouth daily.    . carvedilol (COREG) 12.5 MG tablet Take 12.5 mg by mouth 2 (two) times daily with a meal.    . Cholecalciferol (VITAMIN D3) 5000 UNITS TABS Take 5,000 Units by mouth daily.    . Cinnamon 500 MG capsule Take 500 mg by mouth daily.    Marland Kitchen co-enzyme Q-10 30 MG capsule Take 30 mg by mouth daily.    Marland Kitchen ezetimibe (ZETIA) 10 MG tablet Take 10 mg by mouth every evening.    . furosemide (LASIX) 20 MG tablet Take 1 tablet (20 mg total) by mouth daily as needed. (Patient taking differently: Take 20 mg by mouth daily as needed for fluid or edema (over weight of 239 pounds). ) 30 tablet 3  . levothyroxine (SYNTHROID, LEVOTHROID) 137 MCG tablet Take 137 mcg by mouth daily. Take 1 1/2 tablet only on wednesday    . losartan (COZAAR) 25 MG tablet Take 25 mg by mouth daily.    . Multiple Vitamin (MULTIVITAMIN WITH MINERALS) TABS tablet Take 1 tablet by  mouth 2 (two) times daily.    Marland Kitchen testosterone cypionate (DEPOTESTOSTERONE CYPIONATE) 200 MG/ML injection Inject 200 mg into the muscle every 14 (fourteen) days.    Marland Kitchen venlafaxine XR (EFFEXOR-XR) 150 MG 24 hr capsule Take 150 mg by mouth every evening.     No current facility-administered medications for this visit.    Allergies:   No Known Allergies  Social History:  The patient  reports that he quit smoking about 46 years ago. His smoking use included Cigarettes. He has never used smokeless tobacco. He reports that he does not drink alcohol or use illicit drugs.   Family history:   Family History  Problem Relation Age of Onset  . Heart attack Mother   . Other Father     odd stomach disorder  . Stroke Maternal Grandmother   .  Heart attack Maternal Grandfather   . Heart attack Paternal Grandmother   . Heart attack Paternal Grandfather     ROS:  Please see the history of present illness.  All other systems reviewed and negative.   PHYSICAL EXAM: VS:  BP 150/84 mmHg  Pulse 66  Ht 5\' 10"  ([id number] m)  Wt 236 lb 8 oz (107.276 kg)  BMI 33.93 kg/m2 Well nourished, well developed, in no acute distress HEENT: Pupils are equal round react to light accommodation extraocular movements are intact.  Neck: no JVDNo cervical lymphadenopathy. Cardiac: Regular rate and rhythm without murmurs rubs or gallops. Lungs:  clear to auscultation bilaterally, no wheezing, rhonchi or rales Abd: soft, nontender, positive bowel sounds all quadrants, no hepatosplenomegaly Ext: no lower extremity edema.  2+ radial and dorsalis pedis pulses. Skin: warm and dry Neuro:  Grossly normal  EKG:   Normal sinus rhythm rate 66 bpm.     ASSESSMENT AND PLAN:  Problem List Items Addressed This Visit    Hyperlipidemia   Essential hypertension   CAD (coronary artery disease)   Atrial fibrillation ([place]) - Primary   Relevant Orders   EKG 12-Lead     Patient presented for post cardioversion follow-up. He is maintaining normal sinus rhythm at 66 bpm. Patient feels much better. His weight is dropped 9 pounds since our last office visit. He feels this is due to fluid. He does not watch his sodium intake and we discussed this. He'll continue taking the 20 mg of Lasix as needed. His blood pressure is elevated this morning however at his last office visit  it was controlled.  No medication changes at this time.  Potentially could go up on his ARB.   Continues on zetie as well as eliquis.

## 2015-01-07 ENCOUNTER — Other Ambulatory Visit: Payer: Self-pay | Admitting: Pharmacist Clinician (PhC)/ Clinical Pharmacy Specialist

## 2015-01-07 MED ORDER — APIXABAN 5 MG PO TABS: 5.0000 mg | ORAL_TABLET | Freq: Two times a day (BID) | ORAL | Status: DC

## 2015-01-13 ENCOUNTER — Encounter: Payer: Self-pay | Admitting: Physician Assistant

## 2015-01-16 ENCOUNTER — Telehealth: Payer: Self-pay | Admitting: Pharmacist Clinician (PhC)/ Clinical Pharmacy Specialist

## 2015-01-16 MED ORDER — APIXABAN 5 MG PO TABS: 5.0000 mg | ORAL_TABLET | Freq: Two times a day (BID) | ORAL | Status: DC

## 2015-01-16 NOTE — Telephone Encounter (Signed)
Pt was unable to fill Eliquis rx thru [facility].  States when he called they told him that because he cancelled a previous order for Eliquis within the last 60 days, they would not fill it for 60 days (from now or that date - not sure).  [facility] [address], he is to call if unable to pick up there.

## 2015-02-13 NOTE — Progress Notes (Signed)
Cardiology Office Note   Date:  02/14/2015   ID:  Gary [name], DOB 1945/10/26, MRN [id number]  PCP:  [name], MD  Cardiologist:   Sharol Harness, MD   Chief Complaint  Patient presents with  . 3 month visit    no chest pain , no sob , no swelling  . Atrial Fibrillation      Patient ID: Gary [name] is a 70 y.o. male with hypertension, CAD, hyperlipidemia and OSA non-compliant with CPAP who presents for management of atrial fibrillation.  Interval History 02/14/15:  After his last appointment, [name] underwent successful cardioversion on 12/3.  He followed up with [name] on 12/16 and was doing well.  At that time he was still in sinus rhythm and had lost 9 lb.  He was referred for [facility] that revealed a fixed inferolateral defect consistent with either artifact or scar.  There was no inducible ischemia.  He has been doing well and denies any chest pain or shortness of breath.  He has not noticed any lower extremity edema, orthopnea or PND.  He thinks that he has remained in sinus rhythm.  He has difficulty affording Eliquis and has been taking 2.5mg  in the am on some days along with aspirin in order to stretch out the pills.   History of Present Illness 11/13/14: Gary [name] saw [name] yesterday, where he was diagnosed with atrial fibrillation and started on Eliquis.  He has been feeling mostly well but has been feeling tired.  He also notes a tired feeling in his legs.  He denies palpitations but notes occasional twinges of chest pain with exertion. This is similar to one of the times when he needed a stent in the past.  These episodes occurred 3 times in the last month, which is increased from his baseline. There is no associated shortness of breath, nausea, vomiting or diaphoresis. The pain is under his left arm and across his left chest.  He commonly notices it when he is doing yard work. This is the most strenuous physical activity that he  does. He denies significant shortness of breath.  He sleeps better in a recliner than laying in the bed.  He denies PND. He is noncompliant with his CPAP and his wife notes that he snores loudly.  Gary [name] denies lower extremity edema. He has gained 10 pounds in the last year, though he attributes this to poor diet.  Gary [name] had several stents placed in the past (he thinks there were 7).  The last was placed approximately 10 years ago.  He subsequently unerwent CABG due to in-stent restenosis.  He was previously cared for by [name] at [facility].   Past Medical History  Diagnosis Date  . Hypertension   . Coronary artery disease   . Depression   . Hyperlipidemia   . Arthritis   . Sleep apnea     CPAP 'Uses half the time"  last study 2009  . Cancer ([place])     skin  . Essential hypertension 11/13/2014  . Hyperlipidemia 11/13/2014  . Atrial fibrillation ([name]) 11/13/2014    Past Surgical History  Procedure Laterality Date  . Coronary artery bypass graft  2006  . Coronary angioplasty      multiple stents  . Carpal tunnel release    . Eye surgery      right eye-  muscular repair  . Total hip arthroplasty  01/03/2012    Procedure: TOTAL HIP  ARTHROPLASTY;  Surgeon: [name], MD;  Location: [facility] ORS;  Service: Orthopedics;  Laterality: Right;  . Cardioversion N/A 12/06/2014    Procedure: CARDIOVERSION;  Surgeon: [name], MD;  Location: [facility] ENDOSCOPY;  Service: Cardiovascular;  Laterality: N/A;     Current Outpatient Prescriptions  Medication Sig Dispense Refill  . apixaban (ELIQUIS) 5 MG TABS tablet Take 1 tablet (5 mg total) by mouth 2 (two) times daily. 180 tablet 3  . APPLE CIDER VINEGAR PO Take 625 mg by mouth daily.    Marland Kitchen CALCIUM-MAGNESIUM-ZINC PO Take 1 tablet by mouth daily.    . carvedilol (COREG) 12.5 MG tablet Take 12.5 mg by mouth 2 (two) times daily with a meal.    . Cholecalciferol (VITAMIN D3) 5000 UNITS TABS Take 5,000 Units by mouth daily.      . Cinnamon 500 MG capsule Take 500 mg by mouth daily.    Marland Kitchen co-enzyme Q-10 30 MG capsule Take 30 mg by mouth daily.    Marland Kitchen ezetimibe (ZETIA) 10 MG tablet Take 5 mg by mouth every evening.     . furosemide (LASIX) 20 MG tablet Take 1 tablet (20 mg total) by mouth daily as needed. (Patient taking differently: Take 20 mg by mouth daily as needed for fluid or edema (over weight of 239 pounds). ) 30 tablet 3  . levothyroxine (SYNTHROID, LEVOTHROID) 137 MCG tablet Take 137 mcg by mouth daily. Take 1 1/2 tablet only on wednesday    . losartan (COZAAR) 25 MG tablet Take 25 mg by mouth daily.    . Multiple Vitamin (MULTIVITAMIN WITH MINERALS) TABS tablet Take 1 tablet by mouth 2 (two) times daily.    Marland Kitchen OVER THE COUNTER MEDICATION Take 1 tablet by mouth at bedtime. Take BLOOD SUGAR SUPPLEMENT    . testosterone cypionate (DEPOTESTOSTERONE CYPIONATE) 200 MG/ML injection Inject 200 mg into the muscle every 14 (fourteen) days.    Marland Kitchen venlafaxine XR (EFFEXOR-XR) 150 MG 24 hr capsule Take 150 mg by mouth every evening.     No current facility-administered medications for this visit.    Allergies:   Review of patient's allergies indicates no known allergies.    Social History:  The patient  reports that he quit smoking about 31 years ago. His smoking use included Cigarettes. He has never used smokeless tobacco. He reports that he does not drink alcohol or use illicit drugs.   Family History:  The patient's family history includes Heart attack in his maternal grandfather, mother, paternal grandfather, and paternal grandmother; Other in his father; Stroke in his maternal grandmother.    ROS:  Please see the history of present illness.   Otherwise, review of systems are positive for none.   All other systems are reviewed and negative.    PHYSICAL EXAM: VS:  BP 120/70 mmHg  Ht 5\' 10"  (1.778 m)  Wt 111.267 kg (245 lb 4.8 oz)  BMI 35.20 kg/m2 , BMI Body mass index is 35.2 kg/(m^2). GENERAL:  Well  appearing HEENT:  Pupils equal round and reactive, fundi not visualized, oral mucosa unremarkable NECK:  No jugular venous distention, waveform within normal limits, carotid upstroke brisk and symmetric, R carotid bruit noted LYMPHATICS:  No cervical adenopathy LUNGS:  Clear to auscultation bilaterally HEART:  RRR.  PMI not displaced or sustained,S1 and S2 within normal limits, no S3, no S4, no clicks, no rubs, no murmurs ABD:  Flat, positive bowel sounds normal in frequency in pitch, no bruits, no rebound, no guarding, no midline  pulsatile mass, no hepatomegaly, no splenomegaly EXT:  2 plus pulses throughout, no edema, no cyanosis no clubbing SKIN:  No rashes no nodules NEURO:  Cranial nerves II through XII grossly intact, motor grossly intact throughout PSYCH:  Cognitively intact, oriented to person place and time   EKG:  EKG is ordered today. The ekg demonstrated sinus bradycardia with sinus arrhythmia.  Rate 59 bpm.  Lexiscan Myoview 11/27/14:  There was no ST segment deviation noted during stress.  No T wave inversion was noted during stress.  Defect 1: There is a small defect of moderate severity.  Small size, moderate-intensity fixed inferolateral wall defect - could represent artifact or scar. The study was non-gated, therefore LVEF was not calculated and wall motion abnormalities could not be determined. Based on the lack of significant reversible ischemia, this is a low risk study.  Recent Labs: 11/13/2014: Magnesium 2.1; TSH 0.749 12/02/2014: BUN 17; Creat 0.79; Hemoglobin 15.3; Platelets 357; Potassium 5.0; Sodium 139    Lipid Panel No results found for: CHOL, TRIG, HDL, CHOLHDL, VLDL, LDLCALC, LDLDIRECT    Wt Readings from Last 3 Encounters:  02/14/15 111.267 kg (245 lb 4.8 oz)  12/20/14 107.276 kg (236 lb 8 oz)  12/06/14 106.142 kg (234 lb)      ASSESSMENT AND PLAN:  # Atrial fibrillation: Mr. Penza remains in NSR after DCCV.  We discussed the importance  of taking Eliquis as prescribed and will provide him samples.  He is not interested in switching to warfarin.  Continue carvedilol.  This patients CHA2DS2-VASc Score and unadjusted Ischemic Stroke Rate (% per year) is equal to 3.2 % stroke rate/year from a score of 3  Above score calculated as 1 point each if present [CHF, HTN, DM, Vascular=MI/PAD/Aortic Plaque, Age if 56-74, or Male.  Above score calculated as 2 points each if present [Age > 75, or Stroke/TIA/TE]  # Chest pain/CAD s/p CABG and PCI: Resolved.  Cardiolite showed a fixed defect, either infarct or artifact but no ischemia.  Continue losartan and carvedilol.  No aspirin on Eliquis.    # Carotid bruit: Mr. Hollan was noted to have a right carotid bruit.  Will refer for carotid ultrasound.  # Hypertension: Blood pressure well-controlled. Continue carvedilol and losartan.  # Hyperlipidemia: Check lipids. He has been intolerant of statins and may be a candidate for PCSK9 inhibitor.  However, this may not be an affordable option for him at this time.  For now continue Zetia.  Will decide on additional therapies once lipids are available.   The patient does not have concerns regarding medicines.  The following changes have been made:  no change  Labs/ tests ordered today include:   Orders Placed This Encounter  Procedures  . Lipid panel  . Comprehensive metabolic panel  . EKG 12-Lead     Disposition:   FU with Latrise Bowland C. Oval Linsey, MD in 1 year.   Signed, Sharol Harness, MD  02/14/2015 9:36 AM    [facility]

## 2015-02-14 ENCOUNTER — Encounter: Payer: Self-pay | Admitting: Cardiovascular Disease

## 2015-02-14 ENCOUNTER — Ambulatory Visit (INDEPENDENT_AMBULATORY_CARE_PROVIDER_SITE_OTHER): Payer: Medicare Other | Admitting: Cardiovascular Disease

## 2015-02-14 VITALS — BP 120/70 | Ht 70.0 in | Wt 245.3 lb

## 2015-02-14 DIAGNOSIS — Z79899 Other long term (current) drug therapy: Secondary | ICD-10-CM | POA: Diagnosis not present

## 2015-02-14 DIAGNOSIS — I119 Hypertensive heart disease without heart failure: Secondary | ICD-10-CM

## 2015-02-14 DIAGNOSIS — I2510 Atherosclerotic heart disease of native coronary artery without angina pectoris: Secondary | ICD-10-CM | POA: Diagnosis not present

## 2015-02-14 DIAGNOSIS — I2583 Coronary atherosclerosis due to lipid rich plaque: Secondary | ICD-10-CM

## 2015-02-14 DIAGNOSIS — E785 Hyperlipidemia, unspecified: Secondary | ICD-10-CM

## 2015-02-14 DIAGNOSIS — R0989 Other specified symptoms and signs involving the circulatory and respiratory systems: Secondary | ICD-10-CM | POA: Diagnosis not present

## 2015-02-14 DIAGNOSIS — I480 Paroxysmal atrial fibrillation: Secondary | ICD-10-CM

## 2015-02-14 MED ORDER — APIXABAN 5 MG PO TABS: 5.0000 mg | ORAL_TABLET | Freq: Two times a day (BID) | ORAL | Status: DC

## 2015-02-14 NOTE — Patient Instructions (Addendum)
LABS- lipids, cmp- do not eat or drink the morning of the test  Your physician has requested that you have a carotid duplex. This test is an ultrasound of the carotid arteries in your neck. It looks at blood flow through these arteries that supply the brain with blood. Allow one hour for this exam. There are no restrictions or special instructions.  Will call with results  Your physician wants you to follow-up in 12 months with Dr Buddy Duty.  You will receive a reminder letter in the mail two months in advance. If you don't receive a letter, please call our office to schedule the follow-up appointment.  If you need a refill on your cardiac medications before your next appointment, please call your pharmacy.

## 2015-02-17 ENCOUNTER — Telehealth: Payer: Self-pay

## 2015-02-17 NOTE — Telephone Encounter (Signed)
Eliquis has been approved through 01/04/2016.

## 2015-02-19 ENCOUNTER — Ambulatory Visit (HOSPITAL_COMMUNITY)
Admission: RE | Admit: 2015-02-19 | Discharge: 2015-02-19 | Disposition: A | Discharge status: Home / Self Care | Payer: Medicare Other | Source: Ambulatory Visit | Attending: Cardiovascular Disease | Admitting: Cardiovascular Disease

## 2015-02-19 DIAGNOSIS — Z79899 Other long term (current) drug therapy: Secondary | ICD-10-CM

## 2015-02-19 DIAGNOSIS — R0989 Other specified symptoms and signs involving the circulatory and respiratory systems: Secondary | ICD-10-CM | POA: Diagnosis not present

## 2015-02-19 DIAGNOSIS — E785 Hyperlipidemia, unspecified: Secondary | ICD-10-CM

## 2015-02-19 DIAGNOSIS — I6523 Occlusion and stenosis of bilateral carotid arteries: Secondary | ICD-10-CM | POA: Diagnosis not present

## 2015-02-19 DIAGNOSIS — I10 Essential (primary) hypertension: Secondary | ICD-10-CM | POA: Diagnosis not present

## 2015-02-20 LAB — COMPREHENSIVE METABOLIC PANEL
ALT: 14 U/L (ref 9–46)
AST: 14 U/L (ref 10–35)
Albumin: 4 g/dL (ref 3.6–5.1)
Alkaline Phosphatase: 92 U/L (ref 40–115)
BILIRUBIN TOTAL: 0.4 mg/dL (ref 0.2–1.2)
BUN: 17 mg/dL (ref 7–25)
CALCIUM: 8.9 mg/dL (ref 8.6–10.3)
CO2: 27 mmol/L (ref 20–31)
Chloride: 105 mmol/L (ref 98–110)
Creat: 0.8 mg/dL (ref 0.70–1.18)
GLUCOSE: 126 mg/dL — AB (ref 65–99)
Potassium: 4.5 mmol/L (ref 3.5–5.3)
Sodium: 140 mmol/L (ref 135–146)
Total Protein: 6.7 g/dL (ref 6.1–8.1)

## 2015-02-20 LAB — LIPID PANEL
CHOL/HDL RATIO: 5.4 ratio — AB (ref <=5.0)
CHOLESTEROL: 193 mg/dL (ref 125–200)
HDL: 36 mg/dL — ABNORMAL LOW (ref >=40)
LDL CALC: 135 mg/dL — AB (ref <130)
Triglycerides: 110 mg/dL (ref <150)
VLDL: 22 mg/dL (ref <30)

## 2015-02-21 ENCOUNTER — Telehealth: Payer: Self-pay | Admitting: *Deleted

## 2015-02-21 NOTE — Telephone Encounter (Signed)
-----   Message from [name], MD sent at 02/21/2015  4:17 PM EST ----- Cholesterol levels are above goal given his heart history.  Please refer to lipid clinic to discuss options.  He may be a candidate for PCSK9 inhibitors.

## 2015-02-21 NOTE — Telephone Encounter (Signed)
-----   Message from [name], MD sent at 02/19/2015  7:33 PM EST ----- Very mild blockages.  No need to change any meds yet.

## 2015-02-21 NOTE — Telephone Encounter (Signed)
LEFT MESSAGE TO CALL BACK RESULTS OF LABS,CAROTID RESULTS

## 2015-02-22 DIAGNOSIS — H109 Unspecified conjunctivitis: Secondary | ICD-10-CM | POA: Diagnosis not present

## 2015-02-24 ENCOUNTER — Telehealth: Payer: Self-pay | Admitting: Cardiovascular Disease

## 2015-02-24 ENCOUNTER — Encounter: Payer: Self-pay | Admitting: Cardiology

## 2015-02-24 NOTE — Telephone Encounter (Signed)
Follow up     Patient returning call back to nurse from Friday evening.

## 2015-02-24 NOTE — Telephone Encounter (Signed)
Patient called and notified of results. Voiced understanding & agreed to appointment with Erasmo Downer for lipid management.   Staff message sent to scheduling pool to arrange.

## 2015-03-10 ENCOUNTER — Ambulatory Visit (INDEPENDENT_AMBULATORY_CARE_PROVIDER_SITE_OTHER): Payer: Medicare Other | Admitting: Pharmacist Clinician (PhC)/ Clinical Pharmacy Specialist

## 2015-03-10 VITALS — Ht 70.0 in | Wt 243.4 lb

## 2015-03-10 DIAGNOSIS — E785 Hyperlipidemia, unspecified: Secondary | ICD-10-CM | POA: Diagnosis not present

## 2015-03-10 DIAGNOSIS — I2510 Atherosclerotic heart disease of native coronary artery without angina pectoris: Secondary | ICD-10-CM

## 2015-03-11 ENCOUNTER — Encounter: Payer: Self-pay | Admitting: Pharmacist Clinician (PhC)/ Clinical Pharmacy Specialist

## 2015-03-11 NOTE — Assessment & Plan Note (Signed)
Gary [name] is willing to take a PCSK-9 inhibitor except that it will most likely be cost prohibitive.  He already has difficulty with costs of Eliquis and ezetimibe.  We reviewed the option of the CLEAR trial at [facility] and he is happy to go that route.  I will send his information to them today.  We also reviewed dietary choices to help with cholesterol and diabetes control.  We will discuss further options should he not be a candidate for the research group.

## 2015-03-11 NOTE — Progress Notes (Signed)
03/11/2015 [name] 1945/03/10 [id number]   HPI:  Gary [name] is a 70 y.o. male patient of Dr Oval Linsey who presents today for a lipid clinic evaluation.  Mr Obie cardiac history is significant for CABG in 2006 and multiple stents.  He believes there were 7 in total, with the last being placed about 10 years ago.  He is concerned that his cholesterol labs drawn on February 15 are not a reflection of his normal diet, as he ate "all the wrong foods" in December and much of January.  He has been advised to watch his diet as his A1c is rising, but he has not yet made any dietary adjustments.  He also has atrial fibrillation for which he takes Eliquis.  He admits that it is costly to him.    Meds: ezetimibe 5 mg daily, Cholestoff 3 tablets daily   Intolerant/previously tried: pravastatin 80 mg; rosuvastatin, simvastatin and atorvastatin - doses unknown, tried while a patient of Dr. Marlou Porch at [facility] and Dr. Quay Burow  Family history: father had first MI at 74, debilitating stroke at 11, died at 23; mother with HF and MI in her 61's; brother died from MI at 72  Diet: admits it is not what it should be; avoids high fiber foods for GI issues; eats out 75% of meals, likes pastas   Exercise: no specific exercise, does yard and house work, goes for occasional walk    Labs:  02/2015:  TC 193, TG 110, HDL 36, LDL 135   Current Outpatient Prescriptions  Medication Sig Dispense Refill  . apixaban (ELIQUIS) 5 MG TABS tablet Take 1 tablet (5 mg total) by mouth 2 (two) times daily. 180 tablet 3  . APPLE CIDER VINEGAR PO Take 625 mg by mouth daily.    Marland Kitchen CALCIUM-MAGNESIUM-ZINC PO Take 1 tablet by mouth daily.    . carvedilol (COREG) 12.5 MG tablet Take 12.5 mg by mouth 2 (two) times daily with a meal.    . Cholecalciferol (VITAMIN D3) 5000 UNITS TABS Take 5,000 Units by mouth daily.    . Cinnamon 500 MG capsule Take 500 mg by mouth daily.    Marland Kitchen co-enzyme Q-10 30 MG capsule Take 30 mg by mouth  daily.    Marland Kitchen ezetimibe (ZETIA) 10 MG tablet Take 5 mg by mouth every evening.     . furosemide (LASIX) 20 MG tablet Take 1 tablet (20 mg total) by mouth daily as needed. (Patient taking differently: Take 20 mg by mouth daily as needed for fluid or edema (over weight of 239 pounds). ) 30 tablet 3  . levothyroxine (SYNTHROID, LEVOTHROID) 137 MCG tablet Take 137 mcg by mouth daily. Take 1 1/2 tablet only on wednesday    . losartan (COZAAR) 25 MG tablet Take 25 mg by mouth daily.    . Multiple Vitamin (MULTIVITAMIN WITH MINERALS) TABS tablet Take 1 tablet by mouth 2 (two) times daily.    Marland Kitchen OVER THE COUNTER MEDICATION Take 1 tablet by mouth at bedtime. Take BLOOD SUGAR SUPPLEMENT    . testosterone cypionate (DEPOTESTOSTERONE CYPIONATE) 200 MG/ML injection Inject 200 mg into the muscle every 14 (fourteen) days.    Marland Kitchen venlafaxine XR (EFFEXOR-XR) 150 MG 24 hr capsule Take 150 mg by mouth every evening.     No current facility-administered medications for this visit.    No Known Allergies  Past Medical History  Diagnosis Date  . Hypertension   . Coronary artery disease   . Depression   .  Hyperlipidemia   . Arthritis   . Sleep apnea     CPAP 'Uses half the time"  last study 2009  . Cancer ([place])     skin  . Essential hypertension 11/13/2014  . Hyperlipidemia 11/13/2014  . Atrial fibrillation ([place]) 11/13/2014    Height 5\' 10"  (1.778 m), weight 243 lb 6.4 oz (110.406 kg).   Tommy Medal PharmD CPP [facility]

## 2015-04-07 DIAGNOSIS — L570 Actinic keratosis: Secondary | ICD-10-CM | POA: Diagnosis not present

## 2015-04-07 DIAGNOSIS — Z85828 Personal history of other malignant neoplasm of skin: Secondary | ICD-10-CM | POA: Diagnosis not present

## 2015-04-07 DIAGNOSIS — L821 Other seborrheic keratosis: Secondary | ICD-10-CM | POA: Diagnosis not present

## 2015-04-07 DIAGNOSIS — L738 Other specified follicular disorders: Secondary | ICD-10-CM | POA: Diagnosis not present

## 2015-04-07 DIAGNOSIS — D1801 Hemangioma of skin and subcutaneous tissue: Secondary | ICD-10-CM | POA: Diagnosis not present

## 2015-04-07 DIAGNOSIS — L812 Freckles: Secondary | ICD-10-CM | POA: Diagnosis not present

## 2015-04-16 DIAGNOSIS — E7800 Pure hypercholesterolemia, unspecified: Secondary | ICD-10-CM | POA: Diagnosis not present

## 2015-04-16 DIAGNOSIS — F324 Major depressive disorder, single episode, in partial remission: Secondary | ICD-10-CM | POA: Diagnosis not present

## 2015-04-16 DIAGNOSIS — I4891 Unspecified atrial fibrillation: Secondary | ICD-10-CM | POA: Diagnosis not present

## 2015-04-16 DIAGNOSIS — E039 Hypothyroidism, unspecified: Secondary | ICD-10-CM | POA: Diagnosis not present

## 2015-04-16 DIAGNOSIS — Z125 Encounter for screening for malignant neoplasm of prostate: Secondary | ICD-10-CM | POA: Diagnosis not present

## 2015-04-16 DIAGNOSIS — R739 Hyperglycemia, unspecified: Secondary | ICD-10-CM | POA: Diagnosis not present

## 2015-04-16 DIAGNOSIS — I25810 Atherosclerosis of coronary artery bypass graft(s) without angina pectoris: Secondary | ICD-10-CM | POA: Diagnosis not present

## 2015-04-16 DIAGNOSIS — I10 Essential (primary) hypertension: Secondary | ICD-10-CM | POA: Diagnosis not present

## 2015-04-16 DIAGNOSIS — Z1389 Encounter for screening for other disorder: Secondary | ICD-10-CM | POA: Diagnosis not present

## 2015-04-16 DIAGNOSIS — Z0000 Encounter for general adult medical examination without abnormal findings: Secondary | ICD-10-CM | POA: Diagnosis not present

## 2015-04-16 DIAGNOSIS — F322 Major depressive disorder, single episode, severe without psychotic features: Secondary | ICD-10-CM | POA: Diagnosis not present

## 2015-04-16 DIAGNOSIS — E291 Testicular hypofunction: Secondary | ICD-10-CM | POA: Diagnosis not present

## 2015-07-03 DIAGNOSIS — G5631 Lesion of radial nerve, right upper limb: Secondary | ICD-10-CM | POA: Diagnosis not present

## 2015-08-01 DIAGNOSIS — G5631 Lesion of radial nerve, right upper limb: Secondary | ICD-10-CM | POA: Diagnosis not present

## 2015-08-01 DIAGNOSIS — R200 Anesthesia of skin: Secondary | ICD-10-CM | POA: Diagnosis not present

## 2015-09-01 DIAGNOSIS — H2512 Age-related nuclear cataract, left eye: Secondary | ICD-10-CM | POA: Diagnosis not present

## 2015-09-01 DIAGNOSIS — H5212 Myopia, left eye: Secondary | ICD-10-CM | POA: Diagnosis not present

## 2015-09-01 DIAGNOSIS — H25811 Combined forms of age-related cataract, right eye: Secondary | ICD-10-CM | POA: Diagnosis not present

## 2015-09-01 DIAGNOSIS — H52222 Regular astigmatism, left eye: Secondary | ICD-10-CM | POA: Diagnosis not present

## 2015-10-21 DIAGNOSIS — E291 Testicular hypofunction: Secondary | ICD-10-CM | POA: Diagnosis not present

## 2015-10-21 DIAGNOSIS — I10 Essential (primary) hypertension: Secondary | ICD-10-CM | POA: Diagnosis not present

## 2015-10-21 DIAGNOSIS — E039 Hypothyroidism, unspecified: Secondary | ICD-10-CM | POA: Diagnosis not present

## 2015-10-21 DIAGNOSIS — G4733 Obstructive sleep apnea (adult) (pediatric): Secondary | ICD-10-CM | POA: Diagnosis not present

## 2015-10-21 DIAGNOSIS — G5600 Carpal tunnel syndrome, unspecified upper limb: Secondary | ICD-10-CM | POA: Diagnosis not present

## 2015-10-21 DIAGNOSIS — Z23 Encounter for immunization: Secondary | ICD-10-CM | POA: Diagnosis not present

## 2015-10-21 DIAGNOSIS — F324 Major depressive disorder, single episode, in partial remission: Secondary | ICD-10-CM | POA: Diagnosis not present

## 2015-10-21 DIAGNOSIS — F322 Major depressive disorder, single episode, severe without psychotic features: Secondary | ICD-10-CM | POA: Diagnosis not present

## 2015-10-21 DIAGNOSIS — I25810 Atherosclerosis of coronary artery bypass graft(s) without angina pectoris: Secondary | ICD-10-CM | POA: Diagnosis not present

## 2015-10-21 DIAGNOSIS — I4891 Unspecified atrial fibrillation: Secondary | ICD-10-CM | POA: Diagnosis not present

## 2015-10-21 DIAGNOSIS — R7303 Prediabetes: Secondary | ICD-10-CM | POA: Diagnosis not present

## 2015-10-21 DIAGNOSIS — E7800 Pure hypercholesterolemia, unspecified: Secondary | ICD-10-CM | POA: Diagnosis not present

## 2015-10-27 DIAGNOSIS — J029 Acute pharyngitis, unspecified: Secondary | ICD-10-CM | POA: Diagnosis not present

## 2015-12-04 DIAGNOSIS — G5631 Lesion of radial nerve, right upper limb: Secondary | ICD-10-CM | POA: Diagnosis not present

## 2015-12-11 DIAGNOSIS — M79631 Pain in right forearm: Secondary | ICD-10-CM | POA: Diagnosis not present

## 2015-12-15 ENCOUNTER — Encounter: Payer: Self-pay | Admitting: Cardiovascular Disease

## 2015-12-19 ENCOUNTER — Telehealth: Payer: Self-pay | Admitting: *Deleted

## 2015-12-19 DIAGNOSIS — G5631 Lesion of radial nerve, right upper limb: Secondary | ICD-10-CM | POA: Diagnosis not present

## 2015-12-19 MED ORDER — LOSARTAN POTASSIUM 50 MG PO TABS: 50.0000 mg | ORAL_TABLET | Freq: Every day | ORAL | 3 refills | Status: DC

## 2015-12-19 NOTE — Telephone Encounter (Signed)
Received patient as a walk in-requesting new RX for Losartan, states it was recently increased and he is running out.  Per MD on 12/11 pt was to increase Losartan from 25mg  to 50mg .  Pt verbalized understanding.  New rx sent to preferred pharmacy, advised to keep BP log and call with further questions/concerns.    Appt made for 2 week f/u with pharmacy.

## 2016-01-07 NOTE — Progress Notes (Addendum)
Patient ID: TERRIL JUNKINS                 DOB: October 06, 1945                      MRN: [id number]     HPI: Gary [name] is a 71 y.o. male referred by Dr. Lucretia Field to HTN clinic.  PMH includes HTN, hyperlipidemia, CAD with multiple stents, CABG in 2006 and Afib.  Losartan dose increased from 25 mg daily to 50 mg daily on Dec/12/2015.  Patient presents to clinic for evaluation.   Denies shortness of breath, dizziness or fatigue. Tolerating losartan 50mg  without problems. Patient stated taking furosemide 20mg  daily schedule ,not PRN, since December when his blood pressure was ~200s.   Current HTN meds: losartan 50 mg daily, furosemide 20 mg daily (not PRN), carvedilol 12.5mg  twice daily  Previously tried: losartan 25mg   BP goal: <130/80  Family History: Family history: father had first MI at 63, debilitating stroke at 56, died at 37; mother with HF and MI in her 48's; brother died from MI at 98  Diet: cutting back on salt, uses table, avoids high fiber foods for GI issues; eats out 75% of meals, likes pastas   Exercise: no specific exercise, does yard and house work, goes for occasional walk   Home BP readings: 15 reading; average 131/68 with range 112-148/54-81   Wt Readings from Last 3 Encounters:  03/11/15 243 lb 6.4 oz (110.4 kg)  02/14/15 245 lb 4.8 oz (111.3 kg)  12/20/14 236 lb 8 oz (107.3 kg)   BP Readings from Last 3 Encounters:  02/14/15 120/70  12/20/14 (!) 150/84  12/06/14 132/71   Pulse Readings from Last 3 Encounters:  12/20/14 66  12/06/14 76  11/13/14 66     Past Medical History:  Diagnosis Date  . Arthritis   . Atrial fibrillation ([place]) 11/13/2014  . Cancer ([place])    skin  . Coronary artery disease   . Depression   . Essential hypertension 11/13/2014  . Hyperlipidemia   . Hyperlipidemia 11/13/2014  . Hypertension   . Sleep apnea    CPAP 'Uses half the time"  last study 2009    Current Outpatient Prescriptions on File Prior to Visit  Medication Sig  Dispense Refill  . apixaban (ELIQUIS) 5 MG TABS tablet Take 1 tablet (5 mg total) by mouth 2 (two) times daily. 180 tablet 3  . APPLE CIDER VINEGAR PO Take 625 mg by mouth daily.    Marland Kitchen CALCIUM-MAGNESIUM-ZINC PO Take 1 tablet by mouth daily.    . carvedilol (COREG) 12.5 MG tablet Take 12.5 mg by mouth 2 (two) times daily with a meal.    . Cholecalciferol (VITAMIN D3) 5000 UNITS TABS Take 5,000 Units by mouth daily.    . Cinnamon 500 MG capsule Take 500 mg by mouth daily.    Marland Kitchen co-enzyme Q-10 30 MG capsule Take 30 mg by mouth daily.    Marland Kitchen ezetimibe (ZETIA) 10 MG tablet Take 5 mg by mouth every evening.     . furosemide (LASIX) 20 MG tablet Take 1 tablet (20 mg total) by mouth daily as needed. (Patient taking differently: Take 20 mg by mouth daily as needed for fluid or edema (over weight of 239 pounds). ) 30 tablet 3  . levothyroxine (SYNTHROID, LEVOTHROID) 137 MCG tablet Take 137 mcg by mouth daily. Take 1 1/2 tablet only on wednesday    . losartan (COZAAR) 50 MG tablet  Take 1 tablet (50 mg total) by mouth daily. 90 tablet 3  . Multiple Vitamin (MULTIVITAMIN WITH MINERALS) TABS tablet Take 1 tablet by mouth 2 (two) times daily.    Marland Kitchen OVER THE COUNTER MEDICATION Take 1 tablet by mouth at bedtime. Take BLOOD SUGAR SUPPLEMENT    . testosterone cypionate (DEPOTESTOSTERONE CYPIONATE) 200 MG/ML injection Inject 200 mg into the muscle every 14 (fourteen) days.    Marland Kitchen venlafaxine XR (EFFEXOR-XR) 150 MG 24 hr capsule Take 150 mg by mouth every evening.     No current facility-administered medications on file prior to visit.     No Known Allergies   Assessment/Plan:  1. Hypertension -  Blood pressure above goal of < 130/80 during office visit. Patient took all morning medication about 1 hours prior to visit except for furosemide 20mg . Original order for furosemide 20mg  was PRN ,but patient had been taking daily since mid-December.  Home BP readings are at desired goal when patient using losartan 50mg ,  carvedilol 12.5mg  and furosemide 20mg .  Will continue current anti-hypertension regimen with furosemide 20mg  daily instead of as needed.  BMET in 2 weeks to assess potassium level and renal function, but patient prefers to coordinate with PCP for insurance reasons.  Clinic follow up in 3 weeks, patient will call back to schedule appoinment once BMET and lipid labs ordered by PCP.     Naika Noto Rodriguez-Guzman PharmD, [place] [id number] 01/08/2016 8:54 AM   Patient called back. He is taking Mucinex D for a cold and forgot to tell us. Also stated he will prefer to hold on blood work until mid-February.  Instructed to go back to take Coricidin D and stop taking Mucinex.  Okay to start taking  furosemide 20mg  daily on February 1 (as requested by patient), and have BMET and lipids test on February 9-12 prior to appointment with Dr Oval Linsey. Will assess renal function, BP and potassium level 2 weeks after furosemide initiated.  Jennell Janosik Rodriguez-Guzman PharmD, [place] [id number] 01/08/2016 4:34 PM

## 2016-01-08 ENCOUNTER — Ambulatory Visit (INDEPENDENT_AMBULATORY_CARE_PROVIDER_SITE_OTHER): Payer: Medicare Other | Admitting: Pharmacist

## 2016-01-08 VITALS — BP 142/80 | HR 61

## 2016-01-08 DIAGNOSIS — I10 Essential (primary) hypertension: Secondary | ICD-10-CM

## 2016-01-08 MED ORDER — FUROSEMIDE 20 MG PO TABS: 20.0000 mg | ORAL_TABLET | Freq: Every day | ORAL | 3 refills | Status: DC

## 2016-01-08 NOTE — Patient Instructions (Addendum)
Blood pressure today was 142/80 ; pulse 61  1.  Continue taking carvedilol 12.5mg  twice daily and losartan 50 mg daily  2.  Start furosemide 20 mg daily in the morning  3.  Blood work in 2 weeks (coordinate with primary care physician)  4. Continue to monitor BP at home and keep records for next office visit  5. Follow up with hypertension clinic in 3 weeks (patient to call for appointment day and time) [phone]    Hypertension Hypertension, commonly called high blood pressure, is when the force of blood pumping through your arteries is too strong. Your arteries are the blood vessels that carry blood from your heart throughout your body. A blood pressure reading consists of a higher number over a lower number, such as 110/72. The higher number (systolic) is the pressure inside your arteries when your heart pumps. The lower number (diastolic) is the pressure inside your arteries when your heart relaxes. Ideally you want your blood pressure below 120/80. Hypertension forces your heart to work harder to pump blood. Your arteries may become narrow or stiff. Having untreated or uncontrolled hypertension can cause heart attack, stroke, kidney disease, and other problems. What increases the risk? Some risk factors for high blood pressure are controllable. Others are not. Risk factors you cannot control include:  Race. You may be at higher risk if you are African American.  Age. Risk increases with age.  Gender. Men are at higher risk than women before age 39 years. After age 62, women are at higher risk than men. Risk factors you can control include:  Not getting enough exercise or physical activity.  Being overweight.  Getting too much fat, sugar, calories, or salt in your diet.  Drinking too much alcohol. What are the signs or symptoms? Hypertension does not usually cause signs or symptoms. Extremely high blood pressure (hypertensive crisis) may cause headache, anxiety, shortness  of breath, and nosebleed. How is this diagnosed? To check if you have hypertension, your health care provider will measure your blood pressure while you are seated, with your arm held at the level of your heart. It should be measured at least twice using the same arm. Certain conditions can cause a difference in blood pressure between your right and left arms. A blood pressure reading that is higher than normal on one occasion does not mean that you need treatment. If it is not clear whether you have high blood pressure, you may be asked to return on a different day to have your blood pressure checked again. Or, you may be asked to monitor your blood pressure at home for 1 or more weeks. How is this treated? Treating high blood pressure includes making lifestyle changes and possibly taking medicine. Living a healthy lifestyle can help lower high blood pressure. You may need to change some of your habits. Lifestyle changes may include:  Following the DASH diet. This diet is high in fruits, vegetables, and whole grains. It is low in salt, red meat, and added sugars.  Keep your sodium intake below 2,300 mg per day.  Getting at least 30-45 minutes of aerobic exercise at least 4 times per week.  Losing weight if necessary.  Not smoking.  Limiting alcoholic beverages.  Learning ways to reduce stress. Your health care provider may prescribe medicine if lifestyle changes are not enough to get your blood pressure under control, and if one of the following is true:  You are 68-70 years of age and your systolic blood pressure  is above 140.  You are 44 years of age or older, and your systolic blood pressure is above 150.  Your diastolic blood pressure is above 90.  You have diabetes, and your systolic blood pressure is over [id number] or your diastolic blood pressure is over 90.  You have kidney disease and your blood pressure is above 140/90.  You have heart disease and your blood pressure is above  140/90. Your personal target blood pressure may vary depending on your medical conditions, your age, and other factors. Follow these instructions at home:  Have your blood pressure rechecked as directed by your health care provider.  Take medicines only as directed by your health care provider. Follow the directions carefully. Blood pressure medicines must be taken as prescribed. The medicine does not work as well when you skip doses. Skipping doses also puts you at risk for problems.  Do not smoke.  Monitor your blood pressure at home as directed by your health care provider. Contact a health care provider if:  You think you are having a reaction to medicines taken.  You have recurrent headaches or feel dizzy.  You have swelling in your ankles.  You have trouble with your vision. Get help right away if:  You develop a severe headache or confusion.  You have unusual weakness, numbness, or feel faint.  You have severe chest or abdominal pain.  You vomit repeatedly.  You have trouble breathing. This information is not intended to replace advice given to you by your health care provider. Make sure you discuss any questions you have with your health care provider. Document Released: 12/21/2004 Document Revised: 05/29/2015 Document Reviewed: 10/13/2012 Elsevier Interactive Patient Education  2017 [employer].

## 2016-02-10 ENCOUNTER — Telehealth: Payer: Self-pay | Admitting: Pharmacist

## 2016-02-10 ENCOUNTER — Encounter: Payer: Self-pay | Admitting: Pharmacist

## 2016-02-10 NOTE — Telephone Encounter (Signed)
Patient repots BP in [id number] systolic after patient stopped taking high dose Mucinex.  Not taking furosemide 20 mg at this time and feeling well.   Patient instructed to bring copy of most recent lab work from PCP ( nov/2017) and records of home BP to next clinic appointment.

## 2016-02-20 ENCOUNTER — Ambulatory Visit: Payer: Medicare Other | Admitting: Cardiovascular Disease

## 2016-02-20 ENCOUNTER — Ambulatory Visit (INDEPENDENT_AMBULATORY_CARE_PROVIDER_SITE_OTHER): Payer: Medicare Other | Admitting: Cardiovascular Disease

## 2016-02-20 ENCOUNTER — Encounter: Payer: Self-pay | Admitting: Cardiovascular Disease

## 2016-02-20 VITALS — BP 126/72 | HR 66 | Ht 70.0 in | Wt 245.0 lb

## 2016-02-20 DIAGNOSIS — Z951 Presence of aortocoronary bypass graft: Secondary | ICD-10-CM | POA: Diagnosis not present

## 2016-02-20 DIAGNOSIS — I480 Paroxysmal atrial fibrillation: Secondary | ICD-10-CM | POA: Diagnosis not present

## 2016-02-20 DIAGNOSIS — Z955 Presence of coronary angioplasty implant and graft: Secondary | ICD-10-CM

## 2016-02-20 DIAGNOSIS — I6523 Occlusion and stenosis of bilateral carotid arteries: Secondary | ICD-10-CM | POA: Diagnosis not present

## 2016-02-20 DIAGNOSIS — E7800 Pure hypercholesterolemia, unspecified: Secondary | ICD-10-CM | POA: Diagnosis not present

## 2016-02-20 DIAGNOSIS — I712 Thoracic aortic aneurysm, without rupture: Secondary | ICD-10-CM | POA: Diagnosis not present

## 2016-02-20 DIAGNOSIS — I5042 Chronic combined systolic (congestive) and diastolic (congestive) heart failure: Secondary | ICD-10-CM | POA: Diagnosis not present

## 2016-02-20 DIAGNOSIS — I719 Aortic aneurysm of unspecified site, without rupture: Secondary | ICD-10-CM

## 2016-02-20 DIAGNOSIS — I7121 Aneurysm of the ascending aorta, without rupture: Secondary | ICD-10-CM

## 2016-02-20 DIAGNOSIS — I5032 Chronic diastolic (congestive) heart failure: Secondary | ICD-10-CM | POA: Insufficient documentation

## 2016-02-20 HISTORY — DX: Chronic combined systolic (congestive) and diastolic (congestive) heart failure: I50.42

## 2016-02-20 HISTORY — DX: Aortic aneurysm of unspecified site, without rupture: I71.9

## 2016-02-20 NOTE — Progress Notes (Signed)
Cardiology Office Note   1020 Date:  02/20/2016   ID:  Gary [name], DOB 30-Apr-1945, MRN [id number]  PCP:  Gary Low, MD  Cardiologist:   Gary Latch, MD   Chief Complaint  Patient presents with  . Follow-up    1 year.     Years. Patient ID: Gary [name] is a 71 y.o. male with hypertension, CAD s/p PCI and CABG, Mild ascending aortic aneurysm, chronic systolic and diastolic heart failure, hyperlipidemia, paroxysmal atrial fibrillation, and OSA non-compliant with CPAP who presents for follow up.  Gary [name] was first seen 11/2014 after being diagnosed with atrial fibrillation.  He felt tired and underwent successful DCCV on 12/2014.  He had an echo 11/2014 that revealed LVEF 45-50% with inferolateral hypokinesis.  He also was noted to have an ascending aorta aneurysm (4.0cm).  He then had a Lexiscan Myoview 11/27/14 that revealed a small, fixed inferolateral defect.  At his last appointment he was noted to have a carotid bruit so he was referred for carotid Doppler 02/2015 that revealed 1-39% ICA stenosis bilaterally.  Gary [name] has been doing well. He denies chest pain, shortness of breath, or palpitations. He hasn't experienced any lightheadedness or dizziness and does not think he's had any recurrent atrial fibrillation. He hasn't been exercising regularly and hopes to start another the weather is improving. He uses Lasix as needed. He doesn't typically noticed edema but does sometimes have weight gain and shortness of breath. He takes Lasix approximately 12 times per month. He has been working with the pharmacist for improved blood pressure control and increase losartan to 50 mg daily. Blood pressure log shows that most of his blood pressures have been less than 130/80 with occasional episodes in the 130s.     Past Medical History:  Diagnosis Date  . Aortic aneurysm ([place]) 02/20/2016   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  . Arthritis   . Atrial fibrillation ([place])  11/13/2014  . Cancer ([place])    skin  . Chronic combined systolic and diastolic heart failure ([place]) 02/20/2016   LVEF 45-50% 11/2014  . Coronary artery disease   . Depression   . Essential hypertension 11/13/2014  . Hyperlipidemia   . Hyperlipidemia 11/13/2014  . Hypertension   . Sleep apnea    CPAP 'Uses half the time"  last study 2009    Past Surgical History:  Procedure Laterality Date  . CARDIOVERSION N/A 12/06/2014   Procedure: CARDIOVERSION;  Surgeon: Gary Latch, MD;  Location: [facility];  Service: Cardiovascular;  Laterality: N/A;  . CARPAL TUNNEL RELEASE    . CORONARY ANGIOPLASTY     multiple stents  . CORONARY ARTERY BYPASS GRAFT  2006  . EYE SURGERY     right eye-  muscular repair  . TOTAL HIP ARTHROPLASTY  01/03/2012   Procedure: TOTAL HIP ARTHROPLASTY;  Surgeon: [name], MD;  Location: [facility];  Service: Orthopedics;  Laterality: Right;     Current Outpatient Prescriptions  Medication Sig Dispense Refill  . apixaban (ELIQUIS) 5 MG TABS tablet Take 1 tablet (5 mg total) by mouth 2 (two) times daily. 180 tablet 3  . B Complex Vitamins (B COMPLEX PO) Take 1 tablet by mouth daily.    . carvedilol (COREG) 12.5 MG tablet Take 12.5 mg by mouth 2 (two) times daily with a meal.    . Cholecalciferol (VITAMIN D3) 5000 UNITS TABS Take 5,000 Units by mouth daily.    Marland Kitchen co-enzyme Q-10 30 MG capsule Take 30  mg by mouth daily.    Marland Kitchen ezetimibe (ZETIA) 10 MG tablet Take 10 mg by mouth every evening.     Marland Kitchen levothyroxine (SYNTHROID, LEVOTHROID) 137 MCG tablet Take 137 mcg by mouth daily. Take 1 1/2 tablet only on wednesday    . losartan (COZAAR) 50 MG tablet Take 1 tablet (50 mg total) by mouth daily. (Patient taking differently: Take 25 mg by mouth 2 (two) times daily. ) 90 tablet 3  . Misc Natural Products (TART CHERRY ADVANCED PO) Take 1,200 mg by mouth 2 (two) times daily.    . Multiple Vitamin (MULTIVITAMIN WITH MINERALS) TABS tablet Take 1 tablet by mouth 2 (two) times  daily.    Marland Kitchen OVER THE COUNTER MEDICATION Take 1 tablet by mouth at bedtime. Take BLOOD SUGAR SUPPLEMENT    . testosterone cypionate (DEPOTESTOSTERONE CYPIONATE) 200 MG/ML injection Inject 200 mg into the muscle every 14 (fourteen) days.    . TURMERIC PO Take 1 tablet by mouth daily.    Marland Kitchen venlafaxine XR (EFFEXOR-XR) 150 MG 24 hr capsule Take 150 mg by mouth every evening.     No current facility-administered medications for this visit.     Allergies:   Patient has no known allergies.    Social History:  The patient  reports that he quit smoking about 32 years ago. His smoking use included Cigarettes. He has never used smokeless tobacco. He reports that he does not drink alcohol or use drugs.   Family History:  The patient's family history includes Heart attack in his maternal grandfather, mother, paternal grandfather, and paternal grandmother; Other in his father; Stroke in his maternal grandmother.    ROS:  Please see the history of present illness.   Otherwise, review of systems are positive for none.   All other systems are reviewed and negative.    PHYSICAL EXAM: VS:  BP 126/72   Pulse 66   Ht 5\' 10"  (1.778 m)   Wt 111.1 kg (245 lb)   BMI 35.15 kg/m  , BMI Body mass index is 35.15 kg/m. GENERAL:  Well appearing HEENT:  Pupils equal round and reactive, fundi not visualized, oral mucosa unremarkable NECK:  No jugular venous distention, waveform within normal limits, carotid upstroke brisk and symmetric, mild R carotid bruit noted LYMPHATICS:  No cervical adenopathy LUNGS:  Clear to auscultation bilaterally HEART:  RRR.  PMI not displaced or sustained,S1 and S2 within normal limits, no S3, no S4, no clicks, no rubs, no murmurs ABD:  Flat, positive bowel sounds normal in frequency in pitch, no bruits, no rebound, no guarding, no midline pulsatile mass, no hepatomegaly, no splenomegaly EXT:  2 plus pulses throughout, no edema, no cyanosis no clubbing SKIN:  No rashes no  nodules NEURO:  Cranial nerves II through XII grossly intact, motor grossly intact throughout PSYCH:  Cognitively intact, oriented to person place and time   EKG:  EKG is ordered today. 02/20/16: Sinus rhythm. Rate 66 bpm. Left ventricular hypertrophy with repolarization abnormality.   Echo 11/20/14: Study Conclusions  - Left ventricle: The cavity size was normal. Wall thickness was   increased in a pattern of moderate LVH. Systolic function was   mildly reduced. The estimated ejection fraction was in the range   of 45% to 50%. Inferiorlateral hypokinesis. Abnormal GLPSS at   -11%, with inferolateral strain abnormality. The study is not   technically sufficient to allow evaluation of LV diastolic   function. - Aorta: Ascending aortic diameter: 40 mm (S). - Ascending  aorta: The ascending aorta was mildly dilated. - Left atrium: The atrium was normal in size. - Right atrium: Severely dilated at 30 cm2. - Systemic veins: The IVC measures <2.1 cm, but does not collapse   >50%, suggesting an elevated RA pressure of 8 mmHg.  Lexiscan Myoview 11/27/14:  There was no ST segment deviation noted during stress.  No T wave inversion was noted during stress.  Defect 1: There is a small defect of moderate severity.  Small size, moderate-intensity fixed inferolateral wall defect - could represent artifact or scar. The study was non-gated, therefore LVEF was not calculated and wall motion abnormalities could not be determined. Based on the lack of significant reversible ischemia, this is a [name] risk study.  Carotid Doppler 02/19/15: 1-39% ICA stenosis bilaterally  Recent Labs: No results found for requested labs within last 8760 hours.    Lipid Panel    Component Value Date/Time   CHOL 193 02/19/2015 0821   TRIG 110 02/19/2015 0821   HDL 36 (L) 02/19/2015 0821   CHOLHDL 5.4 (H) 02/19/2015 0821   VLDL 22 02/19/2015 0821   LDLCALC 135 (H) 02/19/2015 0821      Wt Readings from Last  3 Encounters:  02/20/16 111.1 kg (245 lb)  03/11/15 110.4 kg (243 lb 6.4 oz)  02/14/15 111.3 kg (245 lb 4.8 oz)      ASSESSMENT AND PLAN:  # Atrial fibrillation: Mr. Vaswani remains in NSR after DCCV.  Continue Eliquis and carvedilol.  This patients CHA2DS2-VASc Score and unadjusted Ischemic Stroke Rate (% per year) is equal to 3.2 % stroke rate/year from a score of 3  Above score calculated as 1 point each if present [CHF, HTN, DM, Vascular=MI/PAD/Aortic Plaque, Age if 25-74, or Male.  Above score calculated as 2 points each if present [Age > 75, or Stroke/TIA/TE]  # Chest pain/CAD s/p CABG and PCI: Resolved.  Cardiolite showed a fixed defect, either infarct or artifact but no ischemia.  Continue losartan and carvedilol.  No aspirin on Eliquis.    # Carotid stenosis:  Mild carotid stenosis noted on carotid Dopplers 02/2015. We'll repeat 02/2017.  # Hypertension: Blood pressure well-controlled. Continue carvedilol and losartan.  # Hyperlipidemia: We will get a copy of his lipids from 11/2015.  He hasn't tolerated statins.  He is willing to talk with our pharmacist regarding a PCSK9 inhibitor.   # Aortic aneurysm: Ascending aorta was 4.0 cm on echocardiogram 11/2013. We will repeat his echo at this time.  The patient does not have concerns regarding medicines.  The following changes have been made:  no change  Labs/ tests ordered today include:    Disposition:   FU with Khara Renaud C. Oval Linsey, MD in  6 months.  F/U with Raquel for hyperlipidemia.   Signed, Gary Latch, MD  02/20/2016 11:55 AM    [place]

## 2016-02-20 NOTE — Patient Instructions (Addendum)
Medication Instructions:  Your physician recommends that you continue on your current medications as directed. Please refer to the Current Medication list given to you today.  Labwork: NONE  Testing/Procedures: Your physician has requested that you have an echocardiogram. Echocardiography is a painless test that uses sound waves to create images of your heart. It provides your doctor with information about the size and shape of your heart and how well your heart's chambers and valves are working. This procedure takes approximately one hour. There are no restrictions for this procedure. [place] [address]  Follow-Up: Your physician recommends that you schedule a follow-up appointment in: PHARM D, SHE WILL CALL YOU WITH A DATE AND TIME   Your physician wants you to follow-up in: [place] will receive a reminder letter in the mail two months in advance. If you don't receive a letter, please call our office to schedule the follow-up appointment.  If you need a refill on your cardiac medications before your next appointment, please call your pharmacy.

## 2016-02-21 ENCOUNTER — Other Ambulatory Visit: Payer: Self-pay | Admitting: Cardiovascular Disease

## 2016-02-23 NOTE — Telephone Encounter (Signed)
Please review for refill. Thanks!  

## 2016-02-27 ENCOUNTER — Ambulatory Visit: Payer: Medicare Other | Admitting: Cardiovascular Disease

## 2016-03-02 ENCOUNTER — Ambulatory Visit (INDEPENDENT_AMBULATORY_CARE_PROVIDER_SITE_OTHER): Payer: Medicare Other | Admitting: Pharmacist

## 2016-03-02 DIAGNOSIS — E7800 Pure hypercholesterolemia, unspecified: Secondary | ICD-10-CM | POA: Diagnosis not present

## 2016-03-02 DIAGNOSIS — I6523 Occlusion and stenosis of bilateral carotid arteries: Secondary | ICD-10-CM | POA: Diagnosis not present

## 2016-03-02 NOTE — Progress Notes (Signed)
Patient ID: Gary [name]                 DOB: 1945-08-28                    MRN: [id number]     HPI:  Gary [name] is a 71 y.o. male patient referred to lipid clinic by Dr Oval Linsey.  Prior medical history includes CAD s/p PCI and CABG, hypertension, Afib, CHF, aortic aneurysm and hyperlipidemia.  Patient currently taking ezetimibe 10mg  every evening. Reports intolerance to statins with prior trial to pravastatin 80mg  daily, pravastatin 40mg  daily, atorvastatin 10mg  daily, and rosuvastatin 10mg  daily. Patient present to lipid clinic for evaluation and potential PCSK9i initiation.  Current Medications: ezetimibe 10mg  qPM  Intolerances:  Atorvastatin 10mg  - severe upper body myalgia  Rosuvastatin 10mg  - pain and shoulder pain Pravastatin 80mg  - sever upper body myalgias Pravastatin 40mg  - able to tolerate for > 6 months but not enough potency  Risk Factors: CAD s/p PCI and CABG, hypertension, hyperlipidemia  LDL goal: <70  Diet: decrease portion size , low carbohydrates, low fat diet  Exercise: yard work and some walk with good weather  Family History: mother MI , father stroke and MI  Social History: quit smoking about 32 years ago. His smoking use included Cigarettes. He has never used smokeless tobacco. Denies alcohol or illicit drugs.   Labs: CHO 220; TG 162, HDL 36, LDL 151 (October/17/2017)  Past Medical History:  Diagnosis Date  . Aortic aneurysm ([place]) 02/20/2016   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  . Arthritis   . Atrial fibrillation ([place]) 11/13/2014  . Cancer ([place])    skin  . Chronic combined systolic and diastolic heart failure ([place]) 02/20/2016   LVEF 45-50% 11/2014  . Coronary artery disease   . Depression   . Essential hypertension 11/13/2014  . Hyperlipidemia   . Hyperlipidemia 11/13/2014  . Hypertension   . Sleep apnea    CPAP 'Uses half the time"  last study 2009    Current Outpatient Prescriptions on File Prior to Visit  Medication Sig Dispense Refill    . B Complex Vitamins (B COMPLEX PO) Take 1 tablet by mouth daily.    . carvedilol (COREG) 12.5 MG tablet Take 12.5 mg by mouth 2 (two) times daily with a meal.    . Cholecalciferol (VITAMIN D3) 5000 UNITS TABS Take 5,000 Units by mouth daily.    Marland Kitchen co-enzyme Q-10 30 MG capsule Take 30 mg by mouth daily.    Marland Kitchen ELIQUIS 5 MG TABS tablet TAKE 1 TABLET BY MOUTH TWICE A DAY 180 tablet 1  . ezetimibe (ZETIA) 10 MG tablet Take 10 mg by mouth every evening.     Marland Kitchen levothyroxine (SYNTHROID, LEVOTHROID) 137 MCG tablet Take 137 mcg by mouth daily. Take 1 1/2 tablet only on wednesday    . losartan (COZAAR) 50 MG tablet Take 1 tablet (50 mg total) by mouth daily. (Patient taking differently: Take 25 mg by mouth 2 (two) times daily. ) 90 tablet 3  . Misc Natural Products (TART CHERRY ADVANCED PO) Take 1,200 mg by mouth 2 (two) times daily.    . Multiple Vitamin (MULTIVITAMIN WITH MINERALS) TABS tablet Take 1 tablet by mouth 2 (two) times daily.    Marland Kitchen OVER THE COUNTER MEDICATION Take 1 tablet by mouth at bedtime. Take BLOOD SUGAR SUPPLEMENT    . testosterone cypionate (DEPOTESTOSTERONE CYPIONATE) 200 MG/ML injection Inject 200 mg into the muscle every 14 (fourteen)  days.    . TURMERIC PO Take 1 tablet by mouth daily.    Marland Kitchen venlafaxine XR (EFFEXOR-XR) 150 MG 24 hr capsule Take 150 mg by mouth every evening.     No current facility-administered medications on file prior to visit.     No Known Allergies  Hyperlipidemia:  Patient with significant cardiac history including CAD s/p PCI and CABG, hypertension, Afib, CHF, aortic aneurysm and hyperlipidemia.  Intolerance to statin documented. Patient current on ezetimibe 10mg  with LDL above desired goal of <70.  Insurance prior authorization, administration, potential ADR, and storage for Praluent and Repatha discussed during this office visit. Also discussed patient assistant programs available. Will start paper for insurance prior authorization and keep patient undated by  phone.  Kissy Cielo Rodriguez-Guzman PharmD, [place] [address] 03/02/2016 7:58 AM

## 2016-03-04 NOTE — Patient Instructions (Addendum)
Cholesterol Cholesterol is a fat. Your body needs a small amount of cholesterol. Cholesterol (plaque) may build up in your blood vessels (arteries). That makes you more likely to have a heart attack or stroke. You cannot feel your cholesterol level. Having a blood test is the only way to find out if your level is high. Keep your test results. Work with your doctor to keep your cholesterol at a good level. What do the results mean?  Total cholesterol is how much cholesterol is in your blood.  LDL is bad cholesterol. This is the type that can build up. Try to have low LDL.  HDL is good cholesterol. It cleans your blood vessels and carries LDL away. Try to have high HDL.  Triglycerides are fat that the body can store or burn for energy. What are good levels of cholesterol?  Total cholesterol below 200.  LDL below 100 is good for people who have health risks. LDL below 70 is good for people who have very high risks.  HDL above 40 is good. It is best to have HDL of 60 or higher.  Triglycerides below 150. How can I lower my cholesterol? Diet  Follow your diet program as told by your doctor.  Choose fish, white meat chicken, or turkey that is roasted or baked. Try not to eat red meat, fried foods, sausage, or lunch meats.  Eat lots of fresh fruits and vegetables.  Choose whole grains, beans, pasta, potatoes, and cereals.  Choose olive oil, corn oil, or canola oil. Only use small amounts.  Try not to eat butter, mayonnaise, shortening, or palm kernel oils.  Try not to eat foods with trans fats.  Choose low-fat or nonfat dairy foods.  Drink skim or nonfat milk.  Eat low-fat or nonfat yogurt and cheeses.  Try not to drink whole milk or cream.  Try not to eat ice cream, egg yolks, or full-fat cheeses.  Healthy desserts include angel food cake, ginger snaps, animal crackers, hard candy, popsicles, and low-fat or nonfat frozen yogurt. Try not to eat pastries, cakes, pies, and  cookies. Exercise  Follow your exercise program as told by your doctor.  Be more active. Try gardening, walking, and taking the stairs.  Ask your doctor about ways that you can be more active. Medicine  Take over-the-counter and prescription medicines only as told by your doctor. This information is not intended to replace advice given to you by your health care provider. Make sure you discuss any questions you have with your health care provider. Document Released: 03/19/2008 Document Revised: 07/23/2015 Document Reviewed: 07/03/2015 Elsevier Interactive Patient Education  2017 [employer] Inc.  

## 2016-03-08 ENCOUNTER — Telehealth: Payer: Self-pay | Admitting: Pharmacist

## 2016-03-08 MED ORDER — ALIROCUMAB 150 MG/ML ~~LOC~~ SOPN: 1.0000 "pen " | PEN_INJECTOR | SUBCUTANEOUS | 11 refills | Status: DC

## 2016-03-08 NOTE — Telephone Encounter (Signed)
Praluent was pre-approved by insurance.  Patient informed we sent Rx to specialty pharmacy today. Will need co-pay information prior to submit paperwork for Patient assistant program.

## 2016-03-09 ENCOUNTER — Other Ambulatory Visit (HOSPITAL_COMMUNITY): Payer: Medicare Other

## 2016-03-09 ENCOUNTER — Other Ambulatory Visit: Payer: Self-pay | Admitting: Pharmacist

## 2016-03-09 MED ORDER — ALIROCUMAB 150 MG/ML ~~LOC~~ SOPN: 1.0000 "pen " | PEN_INJECTOR | SUBCUTANEOUS | 11 refills | Status: DC

## 2016-03-17 ENCOUNTER — Telehealth: Payer: Self-pay | Admitting: Pharmacist

## 2016-03-17 NOTE — Telephone Encounter (Signed)
Left message to call back  

## 2016-03-17 NOTE — Telephone Encounter (Signed)
Rescheduled echo for 3/16  Patient aware of date, time, and location

## 2016-03-17 NOTE — Telephone Encounter (Signed)
Patient called. He missed his appointment for an ECHO on 03/09/2016 by mistake.   Please call patient with new appointment when possible.

## 2016-03-19 ENCOUNTER — Other Ambulatory Visit (HOSPITAL_COMMUNITY): Payer: Medicare Other

## 2016-04-02 ENCOUNTER — Other Ambulatory Visit: Payer: Self-pay

## 2016-04-02 ENCOUNTER — Ambulatory Visit (HOSPITAL_COMMUNITY): Payer: Medicare Other | Attending: Cardiology

## 2016-04-02 DIAGNOSIS — E785 Hyperlipidemia, unspecified: Secondary | ICD-10-CM | POA: Insufficient documentation

## 2016-04-02 DIAGNOSIS — I110 Hypertensive heart disease with heart failure: Secondary | ICD-10-CM | POA: Diagnosis not present

## 2016-04-02 DIAGNOSIS — I340 Nonrheumatic mitral (valve) insufficiency: Secondary | ICD-10-CM | POA: Insufficient documentation

## 2016-04-02 DIAGNOSIS — I4891 Unspecified atrial fibrillation: Secondary | ICD-10-CM | POA: Diagnosis not present

## 2016-04-02 DIAGNOSIS — I2510 Atherosclerotic heart disease of native coronary artery without angina pectoris: Secondary | ICD-10-CM | POA: Diagnosis not present

## 2016-04-02 DIAGNOSIS — I7121 Aneurysm of the ascending aorta, without rupture: Secondary | ICD-10-CM

## 2016-04-02 DIAGNOSIS — I712 Thoracic aortic aneurysm, without rupture: Secondary | ICD-10-CM | POA: Diagnosis not present

## 2016-04-02 DIAGNOSIS — I5042 Chronic combined systolic (congestive) and diastolic (congestive) heart failure: Secondary | ICD-10-CM | POA: Insufficient documentation

## 2016-04-12 DIAGNOSIS — L821 Other seborrheic keratosis: Secondary | ICD-10-CM | POA: Diagnosis not present

## 2016-04-12 DIAGNOSIS — L738 Other specified follicular disorders: Secondary | ICD-10-CM | POA: Diagnosis not present

## 2016-04-12 DIAGNOSIS — Z85828 Personal history of other malignant neoplasm of skin: Secondary | ICD-10-CM | POA: Diagnosis not present

## 2016-04-12 DIAGNOSIS — D1801 Hemangioma of skin and subcutaneous tissue: Secondary | ICD-10-CM | POA: Diagnosis not present

## 2016-04-12 DIAGNOSIS — L812 Freckles: Secondary | ICD-10-CM | POA: Diagnosis not present

## 2016-04-14 ENCOUNTER — Telehealth: Payer: Self-pay | Admitting: Cardiovascular Disease

## 2016-04-14 NOTE — Telephone Encounter (Signed)
Patient calling states that the new prolene medication has arrived and he will take his first shot tomorrow (04-15-16). Patient wanted to know if he should stop taking the cholecalciferol and the zetia. Patient is aware that ou are out the office today and still wanted me to a send a message back. Patient will be available tomorrow from 1-5pm. Thanks.

## 2016-04-15 NOTE — Telephone Encounter (Signed)
Talked to patient today. Okay to continue taking Zetia and okay to stop cholestoff OTC . Will f/u lipid panel and liver enzymes in 3 months.

## 2016-04-21 DIAGNOSIS — E039 Hypothyroidism, unspecified: Secondary | ICD-10-CM | POA: Diagnosis not present

## 2016-04-21 DIAGNOSIS — I4891 Unspecified atrial fibrillation: Secondary | ICD-10-CM | POA: Diagnosis not present

## 2016-04-21 DIAGNOSIS — I10 Essential (primary) hypertension: Secondary | ICD-10-CM | POA: Diagnosis not present

## 2016-04-21 DIAGNOSIS — I25810 Atherosclerosis of coronary artery bypass graft(s) without angina pectoris: Secondary | ICD-10-CM | POA: Diagnosis not present

## 2016-04-21 DIAGNOSIS — G4733 Obstructive sleep apnea (adult) (pediatric): Secondary | ICD-10-CM | POA: Diagnosis not present

## 2016-04-21 DIAGNOSIS — E236 Other disorders of pituitary gland: Secondary | ICD-10-CM | POA: Diagnosis not present

## 2016-04-21 DIAGNOSIS — R7303 Prediabetes: Secondary | ICD-10-CM | POA: Diagnosis not present

## 2016-04-21 DIAGNOSIS — F324 Major depressive disorder, single episode, in partial remission: Secondary | ICD-10-CM | POA: Diagnosis not present

## 2016-04-21 DIAGNOSIS — N400 Enlarged prostate without lower urinary tract symptoms: Secondary | ICD-10-CM | POA: Diagnosis not present

## 2016-04-21 DIAGNOSIS — Z0000 Encounter for general adult medical examination without abnormal findings: Secondary | ICD-10-CM | POA: Diagnosis not present

## 2016-04-21 DIAGNOSIS — Z1389 Encounter for screening for other disorder: Secondary | ICD-10-CM | POA: Diagnosis not present

## 2016-04-21 DIAGNOSIS — E291 Testicular hypofunction: Secondary | ICD-10-CM | POA: Diagnosis not present

## 2016-04-21 DIAGNOSIS — E7800 Pure hypercholesterolemia, unspecified: Secondary | ICD-10-CM | POA: Diagnosis not present

## 2016-05-12 DIAGNOSIS — G4733 Obstructive sleep apnea (adult) (pediatric): Secondary | ICD-10-CM | POA: Diagnosis not present

## 2016-07-01 ENCOUNTER — Telehealth: Payer: Self-pay | Admitting: Pharmacist

## 2016-07-01 DIAGNOSIS — E7800 Pure hypercholesterolemia, unspecified: Secondary | ICD-10-CM

## 2016-07-01 NOTE — Telephone Encounter (Signed)
1st praluent 150mg  injection on April/19/2018. Due to repeat lipid panel and LFT.   Orders entered in [place]. LMOM for patient to stop by the office within next 2 weeks to complete blood work.

## 2016-07-21 DIAGNOSIS — R7303 Prediabetes: Secondary | ICD-10-CM | POA: Diagnosis not present

## 2016-07-21 DIAGNOSIS — E7800 Pure hypercholesterolemia, unspecified: Secondary | ICD-10-CM | POA: Diagnosis not present

## 2016-07-21 DIAGNOSIS — I10 Essential (primary) hypertension: Secondary | ICD-10-CM | POA: Diagnosis not present

## 2016-07-21 DIAGNOSIS — E039 Hypothyroidism, unspecified: Secondary | ICD-10-CM | POA: Diagnosis not present

## 2016-07-21 DIAGNOSIS — N400 Enlarged prostate without lower urinary tract symptoms: Secondary | ICD-10-CM | POA: Diagnosis not present

## 2016-07-21 DIAGNOSIS — E291 Testicular hypofunction: Secondary | ICD-10-CM | POA: Diagnosis not present

## 2016-07-21 DIAGNOSIS — Z125 Encounter for screening for malignant neoplasm of prostate: Secondary | ICD-10-CM | POA: Diagnosis not present

## 2016-08-19 ENCOUNTER — Other Ambulatory Visit: Payer: Self-pay | Admitting: Pharmacist

## 2016-08-19 MED ORDER — APIXABAN 5 MG PO TABS: 5.0000 mg | ORAL_TABLET | Freq: Two times a day (BID) | ORAL | 2 refills | Status: DC

## 2016-08-24 DIAGNOSIS — F324 Major depressive disorder, single episode, in partial remission: Secondary | ICD-10-CM | POA: Diagnosis not present

## 2016-08-24 DIAGNOSIS — M1990 Unspecified osteoarthritis, unspecified site: Secondary | ICD-10-CM | POA: Diagnosis not present

## 2016-08-24 DIAGNOSIS — N400 Enlarged prostate without lower urinary tract symptoms: Secondary | ICD-10-CM | POA: Diagnosis not present

## 2016-08-24 DIAGNOSIS — I10 Essential (primary) hypertension: Secondary | ICD-10-CM | POA: Diagnosis not present

## 2016-08-24 DIAGNOSIS — F322 Major depressive disorder, single episode, severe without psychotic features: Secondary | ICD-10-CM | POA: Diagnosis not present

## 2016-08-24 DIAGNOSIS — I4891 Unspecified atrial fibrillation: Secondary | ICD-10-CM | POA: Diagnosis not present

## 2016-08-24 DIAGNOSIS — I25810 Atherosclerosis of coronary artery bypass graft(s) without angina pectoris: Secondary | ICD-10-CM | POA: Diagnosis not present

## 2016-08-24 DIAGNOSIS — E039 Hypothyroidism, unspecified: Secondary | ICD-10-CM | POA: Diagnosis not present

## 2016-08-30 ENCOUNTER — Ambulatory Visit (INDEPENDENT_AMBULATORY_CARE_PROVIDER_SITE_OTHER): Payer: Medicare Other | Admitting: Cardiovascular Disease

## 2016-08-30 ENCOUNTER — Encounter: Payer: Self-pay | Admitting: Cardiovascular Disease

## 2016-08-30 VITALS — BP 138/74 | HR 66 | Ht 70.0 in | Wt 245.8 lb

## 2016-08-30 DIAGNOSIS — I6523 Occlusion and stenosis of bilateral carotid arteries: Secondary | ICD-10-CM | POA: Diagnosis not present

## 2016-08-30 DIAGNOSIS — I5042 Chronic combined systolic (congestive) and diastolic (congestive) heart failure: Secondary | ICD-10-CM | POA: Diagnosis not present

## 2016-08-30 DIAGNOSIS — E7800 Pure hypercholesterolemia, unspecified: Secondary | ICD-10-CM

## 2016-08-30 DIAGNOSIS — I480 Paroxysmal atrial fibrillation: Secondary | ICD-10-CM | POA: Diagnosis not present

## 2016-08-30 DIAGNOSIS — I7121 Aneurysm of the ascending aorta, without rupture: Secondary | ICD-10-CM

## 2016-08-30 DIAGNOSIS — I712 Thoracic aortic aneurysm, without rupture: Secondary | ICD-10-CM | POA: Diagnosis not present

## 2016-08-30 DIAGNOSIS — Z951 Presence of aortocoronary bypass graft: Secondary | ICD-10-CM

## 2016-08-30 NOTE — Progress Notes (Signed)
Cardiology Office Note   1020 Date:  08/30/2016   ID:  Gary [name], DOB [birth year], MRN [id number]  PCP:  [name], MD  Cardiologist:   [name], MD   Chief Complaint  Patient presents with  . 6 month visit    pt states no Sx.     Patient ID: Gary [name] is a 71 y.o. male with hypertension, CAD s/p PCI and CABG, mild ascending aortic aneurysm, chronic systolic and diastolic heart failure, hyperlipidemia, paroxysmal atrial fibrillation, and OSA non-compliant with CPAP who presents for follow up.  [name] was first seen 11/2014 after being diagnosed with atrial fibrillation.  He felt tired and underwent successful DCCV on 12/2014.  He had an echo 11/2014 that revealed LVEF 45-50% with inferolateral hypokinesis.  He also was noted to have an ascending aorta aneurysm (4.0cm).  Repeat echo 03/2016 was unchanged. He then had a Lexiscan Myoview 11/27/14 that revealed a small, fixed inferolateral defect.  He was noted to have a carotid bruit so he was referred for carotid Doppler 02/2015 that revealed 1-39% ICA stenosis bilaterally.    Since his last appointment [name] started [employer].  He had lipids checked 07/21/16 where his LDL was found to be 49.  He stopped taking Zetia.  He has been feeling well and denies chest pain or shortness of breath.  When in atrial fibrillation he has profound fatigue.  He hasn't experienced that since his cardioversion. He hasn't been exercising, which he attributes to hot weather.  He plans to start exercising once it cools off.  He denies lower extremity edema, orthopnea or PND.  His blood pressure at home has been in the 116-120s/130s/50s-60s.   Past Medical History:  Diagnosis Date  . Aortic aneurysm ([place]) 02/20/2016   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  . Arthritis   . Atrial fibrillation ([place]) 11/13/2014  . Cancer ([place])    skin  . Chronic combined systolic and diastolic heart failure ([place]) 02/20/2016   LVEF 45-50% 11/2014   . Coronary artery disease   . Depression   . Essential hypertension 11/13/2014  . Hyperlipidemia   . Hyperlipidemia 11/13/2014  . Hypertension   . Sleep apnea    CPAP 'Uses half the time"  last study 2009    Past Surgical History:  Procedure Laterality Date  . CARDIOVERSION N/A 12/06/2014   Procedure: CARDIOVERSION;  Surgeon: [name], MD;  Location: [facility];  Service: Cardiovascular;  Laterality: N/A;  . CARPAL TUNNEL RELEASE    . CORONARY ANGIOPLASTY     multiple stents  . CORONARY ARTERY BYPASS GRAFT  2006  . EYE SURGERY     right eye-  muscular repair  . TOTAL HIP ARTHROPLASTY  01/03/2012   Procedure: TOTAL HIP ARTHROPLASTY;  Surgeon: [name], MD;  Location: [facility];  Service: Orthopedics;  Laterality: Right;     Current Outpatient Prescriptions  Medication Sig Dispense Refill  . Alirocumab (PRALUENT) 150 MG/ML SOPN Inject 1 pen into the skin every 14 (fourteen) days. 2 pen 11  . apixaban (ELIQUIS) 5 MG TABS tablet Take 1 tablet (5 mg total) by mouth 2 (two) times daily. 180 tablet 2  . B Complex Vitamins (B COMPLEX PO) Take 1 tablet by mouth daily.    . carvedilol (COREG) 12.5 MG tablet Take 12.5 mg by mouth 2 (two) times daily with a meal.    . Cholecalciferol (VITAMIN D3) 5000 UNITS TABS Take 5,000 Units by mouth daily.    Marland Kitchen  levothyroxine (SYNTHROID, LEVOTHROID) 137 MCG tablet Take 137 mcg by mouth daily. Take 1 1/2 tablet only on wednesday    . Misc Natural Products (TART CHERRY ADVANCED PO) Take 1,200 mg by mouth 2 (two) times daily.    . Multiple Vitamin (MULTIVITAMIN WITH MINERALS) TABS tablet Take 1 tablet by mouth 2 (two) times daily.    Marland Kitchen OVER THE COUNTER MEDICATION Take 1 tablet by mouth at bedtime. Take BLOOD SUGAR SUPPLEMENT    . testosterone cypionate (DEPOTESTOSTERONE CYPIONATE) 200 MG/ML injection Inject 200 mg into the muscle every 14 (fourteen) days.    . TURMERIC PO Take 1 tablet by mouth daily.    Marland Kitchen venlafaxine XR (EFFEXOR-XR) 150 MG  24 hr capsule Take 150 mg by mouth every evening.    Marland Kitchen losartan (COZAAR) 50 MG tablet Take 1 tablet (50 mg total) by mouth daily. (Patient taking differently: Take 25 mg by mouth 2 (two) times daily. ) 90 tablet 3   No current facility-administered medications for this visit.     Allergies:   Patient has no known allergies.    Social History:  The patient  reports that he quit smoking about 32 years ago. His smoking use included Cigarettes. He has never used smokeless tobacco. He reports that he does not drink alcohol or use drugs.   Family History:  The patient's family history includes Heart attack in his maternal grandfather, mother, paternal grandfather, and paternal grandmother; Other in his father; Stroke in his maternal grandmother.    ROS:  Please see the history of present illness.   Otherwise, review of systems are positive for none.   All other systems are reviewed and negative.    PHYSICAL EXAM: VS:  BP 138/74 (BP Location: Left Arm, Patient Position: Sitting, Cuff Size: Normal)   Pulse 66   Ht 5\' 10"  (1.778 m)   Wt 111.5 kg (245 lb 12.8 oz)   BMI 35.27 kg/m  , BMI Body mass index is 35.27 kg/m. GENERAL:  Well appearing. No acute distress. HEENT:  Pupils equal round and reactive, fundi not visualized, oral mucosa unremarkable NECK:  No jugular venous distention, waveform within normal limits, carotid upstroke brisk and symmetric, mild R carotid bruit noted LYMPHATICS:  No cervical adenopathy LUNGS:  Clear to auscultation bilaterally.  No crackles, wheezes, or rhonchi. HEART:  RRR.  PMI not displaced or sustained,S1 and S2 within normal limits, no S3, no S4, no clicks, no rubs, no murmurs ABD:  Flat, positive bowel sounds normal in frequency in pitch, no bruits, no rebound, no guarding, no midline pulsatile mass, no hepatomegaly, no splenomegaly EXT:  2 plus pulses throughout, no edema, no cyanosis no clubbing SKIN:  No rashes no nodules NEURO:  Cranial nerves II through  XII grossly intact, motor grossly intact throughout PSYCH:  Cognitively intact, oriented to person place and time   EKG:  EKG is ordered today. 02/20/16: Sinus rhythm. Rate 66 bpm. Left ventricular hypertrophy with repolarization abnormality. 08/30/16:  Sinus rhythm. Rate 66 bpm. LAFB. Poor R wave progression.   Echo 11/20/14: Study Conclusions  - Left ventricle: The cavity size was normal. Wall thickness was   increased in a pattern of moderate LVH. Systolic function was   mildly reduced. The estimated ejection fraction was in the range   of 45% to 50%. Inferiorlateral hypokinesis. Abnormal GLPSS at   -11%, with inferolateral strain abnormality. The study is not   technically sufficient to allow evaluation of LV diastolic   function. - Aorta:  Ascending aortic diameter: 40 mm (S). - Ascending aorta: The ascending aorta was mildly dilated. - Left atrium: The atrium was normal in size. - Right atrium: Severely dilated at 30 cm2. - Systemic veins: The IVC measures <2.1 cm, but does not collapse   >50%, suggesting an elevated RA pressure of 8 mmHg.  Lexiscan Myoview 11/27/14:  There was no ST segment deviation noted during stress.  No T wave inversion was noted during stress.  Defect 1: There is a small defect of moderate severity.  Small size, moderate-intensity fixed inferolateral wall defect - could represent artifact or scar. The study was non-gated, therefore LVEF was not calculated and wall motion abnormalities could not be determined. Based on the lack of significant reversible ischemia, this is a low risk study.  Carotid Doppler 02/19/15: 1-39% ICA stenosis bilaterally  Recent Labs: No results found for requested labs within last 8760 hours.    Lipid Panel    Component Value Date/Time   CHOL 193 02/19/2015 0821   TRIG 110 02/19/2015 0821   HDL 36 (L) 02/19/2015 0821   CHOLHDL 5.4 (H) 02/19/2015 0821   VLDL 22 02/19/2015 0821   LDLCALC 135 (H) 02/19/2015 3810      07/21/16: Total cholesterol 105, triglycerides 94, HDL 36, LDL 49  Wt Readings from Last 3 Encounters:  08/30/16 111.5 kg (245 lb 12.8 oz)  02/20/16 111.1 kg (245 lb)  03/11/15 110.4 kg (243 lb 6.4 oz)      ASSESSMENT AND PLAN:  # Atrial fibrillation: Mr. Kaito Schulenburg to be in sinus rhythm. He is doing well and denies any recurrent episodes.  Continue Eliquis and carvedilol.  This patients CHA2DS2-VASc Score and unadjusted Ischemic Stroke Rate (% per year) is equal to 3.2 % stroke rate/year from a score of 3  Above score calculated as 1 point each if present [CHF, HTN, DM, Vascular=MI/PAD/Aortic Plaque, Age if 70-74, or Male.  Above score calculated as 2 points each if present [Age > 75, or Stroke/TIA/TE]  # Chest pain/CAD s/p CABG and PCI: Resolved.  Cardiolite showed a fixed defect, either infarct or artifact but no ischemia.  Continue losartan and carvedilol.  No aspirin on Eliquis.    # Carotid stenosis:  Mild carotid stenosis noted on carotid Dopplers 02/2015.  Repeat 02/2017.  # Hypertension: Blood pressure is slightly above goal but has been well-controlled at home.   Continue carvedilol and losartan.  Recommended that he increase his exercise to 30-40 minutes most days of the week.  # Hyperlipidemia: Lipids are very well-controlled since starting Praluent. Stop Co-Q10.  # Aortic aneurysm: Ascending aorta was 4.0 cm 11/2013 and 03/2016.  The patient does not have concerns regarding medicines.  The following changes have been made:  no change  Labs/ tests ordered today include:    Disposition:   FU with Saralyn Willison C. Oval Linsey, MD in  6 months.   Signed, [name], MD  08/30/2016 9:24 AM    [facility]

## 2016-08-30 NOTE — Patient Instructions (Signed)

## 2016-09-21 DIAGNOSIS — E039 Hypothyroidism, unspecified: Secondary | ICD-10-CM | POA: Diagnosis not present

## 2016-09-21 DIAGNOSIS — I10 Essential (primary) hypertension: Secondary | ICD-10-CM | POA: Diagnosis not present

## 2016-09-21 DIAGNOSIS — F322 Major depressive disorder, single episode, severe without psychotic features: Secondary | ICD-10-CM | POA: Diagnosis not present

## 2016-09-21 DIAGNOSIS — F324 Major depressive disorder, single episode, in partial remission: Secondary | ICD-10-CM | POA: Diagnosis not present

## 2016-09-21 DIAGNOSIS — M1990 Unspecified osteoarthritis, unspecified site: Secondary | ICD-10-CM | POA: Diagnosis not present

## 2016-09-21 DIAGNOSIS — N400 Enlarged prostate without lower urinary tract symptoms: Secondary | ICD-10-CM | POA: Diagnosis not present

## 2016-09-21 DIAGNOSIS — I4891 Unspecified atrial fibrillation: Secondary | ICD-10-CM | POA: Diagnosis not present

## 2016-09-21 DIAGNOSIS — I25810 Atherosclerosis of coronary artery bypass graft(s) without angina pectoris: Secondary | ICD-10-CM | POA: Diagnosis not present

## 2016-09-28 ENCOUNTER — Other Ambulatory Visit: Payer: Self-pay | Admitting: Internal Medicine

## 2016-09-28 ENCOUNTER — Ambulatory Visit
Admission: RE | Admit: 2016-09-28 | Discharge: 2016-09-28 | Disposition: A | Discharge status: Home / Self Care | Payer: Medicare Other | Source: Ambulatory Visit | Attending: Internal Medicine | Admitting: Internal Medicine

## 2016-09-28 DIAGNOSIS — M79601 Pain in right arm: Secondary | ICD-10-CM | POA: Diagnosis not present

## 2016-09-28 DIAGNOSIS — G4733 Obstructive sleep apnea (adult) (pediatric): Secondary | ICD-10-CM | POA: Diagnosis not present

## 2016-09-28 DIAGNOSIS — M25521 Pain in right elbow: Secondary | ICD-10-CM | POA: Diagnosis not present

## 2016-09-28 DIAGNOSIS — S59901A Unspecified injury of right elbow, initial encounter: Secondary | ICD-10-CM | POA: Diagnosis not present

## 2016-09-30 DIAGNOSIS — M25521 Pain in right elbow: Secondary | ICD-10-CM | POA: Diagnosis not present

## 2016-10-20 DIAGNOSIS — F324 Major depressive disorder, single episode, in partial remission: Secondary | ICD-10-CM | POA: Diagnosis not present

## 2016-10-20 DIAGNOSIS — I4891 Unspecified atrial fibrillation: Secondary | ICD-10-CM | POA: Diagnosis not present

## 2016-10-20 DIAGNOSIS — E7800 Pure hypercholesterolemia, unspecified: Secondary | ICD-10-CM | POA: Diagnosis not present

## 2016-10-20 DIAGNOSIS — G4733 Obstructive sleep apnea (adult) (pediatric): Secondary | ICD-10-CM | POA: Diagnosis not present

## 2016-10-20 DIAGNOSIS — I25810 Atherosclerosis of coronary artery bypass graft(s) without angina pectoris: Secondary | ICD-10-CM | POA: Diagnosis not present

## 2016-10-20 DIAGNOSIS — Z23 Encounter for immunization: Secondary | ICD-10-CM | POA: Diagnosis not present

## 2016-10-20 DIAGNOSIS — I10 Essential (primary) hypertension: Secondary | ICD-10-CM | POA: Diagnosis not present

## 2016-10-20 DIAGNOSIS — E039 Hypothyroidism, unspecified: Secondary | ICD-10-CM | POA: Diagnosis not present

## 2016-10-20 DIAGNOSIS — E291 Testicular hypofunction: Secondary | ICD-10-CM | POA: Diagnosis not present

## 2017-03-01 ENCOUNTER — Ambulatory Visit: Payer: Medicare Other | Admitting: Cardiovascular Disease

## 2017-03-21 DIAGNOSIS — J329 Chronic sinusitis, unspecified: Secondary | ICD-10-CM | POA: Diagnosis not present

## 2017-04-12 ENCOUNTER — Encounter: Payer: Self-pay | Admitting: Cardiovascular Disease

## 2017-04-12 ENCOUNTER — Ambulatory Visit (INDEPENDENT_AMBULATORY_CARE_PROVIDER_SITE_OTHER): Payer: Medicare Other | Admitting: Cardiovascular Disease

## 2017-04-12 VITALS — BP 166/80 | HR 65 | Ht 70.0 in | Wt 245.0 lb

## 2017-04-12 DIAGNOSIS — I10 Essential (primary) hypertension: Secondary | ICD-10-CM | POA: Diagnosis not present

## 2017-04-12 DIAGNOSIS — I6523 Occlusion and stenosis of bilateral carotid arteries: Secondary | ICD-10-CM | POA: Diagnosis not present

## 2017-04-12 DIAGNOSIS — Z951 Presence of aortocoronary bypass graft: Secondary | ICD-10-CM

## 2017-04-12 DIAGNOSIS — I7121 Aneurysm of the ascending aorta, without rupture: Secondary | ICD-10-CM

## 2017-04-12 DIAGNOSIS — I712 Thoracic aortic aneurysm, without rupture: Secondary | ICD-10-CM | POA: Diagnosis not present

## 2017-04-12 DIAGNOSIS — I480 Paroxysmal atrial fibrillation: Secondary | ICD-10-CM | POA: Diagnosis not present

## 2017-04-12 DIAGNOSIS — I5042 Chronic combined systolic (congestive) and diastolic (congestive) heart failure: Secondary | ICD-10-CM | POA: Diagnosis not present

## 2017-04-12 DIAGNOSIS — Z955 Presence of coronary angioplasty implant and graft: Secondary | ICD-10-CM

## 2017-04-12 NOTE — Patient Instructions (Signed)
Medication Instructions:  Your physician recommends that you continue on your current medications as directed. Please refer to the Current Medication list given to you today.  Labwork: NONE  Testing/Procedures: Your physician has requested that you have a carotid duplex. This test is an ultrasound of the carotid arteries in your neck. It looks at blood flow through these arteries that supply the brain with blood. Allow one hour for this exam. There are no restrictions or special instructions.  Follow-Up: Your physician recommends that you schedule a follow-up appointment in: 2-4 [place] D  Your physician recommends that you schedule a follow-up appointment in: 3 MONTH OV   Any Other Special Instructions Will Be Listed Below (If Applicable). MONITOR AND TRACK YOUR BLOOD PRESSURE TWICE A DAY. BRING THE READINGS TO YOUR FOLLOW UP WITH PHARM D  If you need a refill on your cardiac medications before your next appointment, please call your pharmacy.

## 2017-04-12 NOTE — Progress Notes (Signed)
Cardiology Office Note   Date:  04/12/2017   ID:  CONLIN BRAHM, DOB 1945/06/08, MRN [id number]  PCP:  [name], MD  Cardiologist:   [name], MD   No chief complaint on file.    Patient ID: Gary [name] is a 72 y.o. male with hypertension, CAD s/p PCI and CABG, mild ascending aortic aneurysm, chronic systolic and diastolic heart failure, hyperlipidemia, paroxysmal atrial fibrillation, and OSA non-compliant with CPAP who presents for follow up.  Gary [name] was first seen 11/2014 after being diagnosed with atrial fibrillation.  He felt tired and underwent successful DCCV on 12/2014.  He had an echo 11/2014 that revealed LVEF 45-50% with inferolateral hypokinesis.  He also was noted to have an ascending aorta aneurysm (4.0cm).  Repeat echo 03/2016 was unchanged. He then had a Lexiscan Myoview 11/27/14 that revealed a small, fixed inferolateral defect.  He was noted to have a carotid bruit so he was referred for carotid Doppler 02/2015 that revealed 1-39% ICA stenosis bilaterally.    Gary [name] has been feeling well.  He has no chest pain but does get short of breath with activity.  He attributes this to being overweight.  His PCP told him that he needs to lose 7% of his body weight in order to avoid taking diabetes medication.  He has not been exercising.  He plans to start back walking soon.  He has not expressed any lower extremity edema, orthopnea, or PND.  He denies any recurrent atrial fibrillation.  He checks his blood pressure at home and on average it is in the 120s-130s over 50s-70s.  It has been as low as 778 systolic.  He got a new blood pressure cuff and is unsure if whether this is accurate.  He does try to limit the salt in his diet.   Past Medical History:  Diagnosis Date  . Aortic aneurysm ([place]) 02/20/2016   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  . Arthritis   . Atrial fibrillation ([place]) 11/13/2014  . Cancer ([place])    skin  . Chronic combined systolic and  diastolic heart failure ([place]) 02/20/2016   LVEF 45-50% 11/2014  . Coronary artery disease   . Depression   . Essential hypertension 11/13/2014  . Hyperlipidemia   . Hyperlipidemia 11/13/2014  . Hypertension   . Sleep apnea    CPAP 'Uses half the time"  last study 2009    Past Surgical History:  Procedure Laterality Date  . CARDIOVERSION N/A 12/06/2014   Procedure: CARDIOVERSION;  Surgeon: [name], MD;  Location: [facility];  Service: Cardiovascular;  Laterality: N/A;  . CARPAL TUNNEL RELEASE    . CORONARY ANGIOPLASTY     multiple stents  . CORONARY ARTERY BYPASS GRAFT  2006  . EYE SURGERY     right eye-  muscular repair  . TOTAL HIP ARTHROPLASTY  01/03/2012   Procedure: TOTAL HIP ARTHROPLASTY;  Surgeon: [name], MD;  Location: [facility];  Service: Orthopedics;  Laterality: Right;     Current Outpatient Medications  Medication Sig Dispense Refill  . Alirocumab (PRALUENT) 150 MG/ML SOPN Inject 1 pen into the skin every 14 (fourteen) days. 2 pen 11  . apixaban (ELIQUIS) 5 MG TABS tablet Take 1 tablet (5 mg total) by mouth 2 (two) times daily. 180 tablet 2  . B Complex Vitamins (B COMPLEX PO) Take 1 tablet by mouth daily.    . carvedilol (COREG) 12.5 MG tablet Take 12.5 mg by mouth 2 (two) times  daily with a meal.    . Cholecalciferol (VITAMIN D3) 5000 UNITS TABS Take 5,000 Units by mouth daily.    Marland Kitchen levothyroxine (SYNTHROID, LEVOTHROID) 137 MCG tablet Take 137 mcg by mouth daily. Take 1 1/2 tablet only on wednesday    . losartan (COZAAR) 25 MG tablet Take 25 mg by mouth 2 (two) times daily.    . Misc Natural Products (TART CHERRY ADVANCED PO) Take 1,200 mg by mouth 2 (two) times daily.    . Multiple Vitamin (MULTIVITAMIN WITH MINERALS) TABS tablet Take 1 tablet by mouth 2 (two) times daily.    Marland Kitchen OVER THE COUNTER MEDICATION Take 1 tablet by mouth at bedtime. Take BLOOD SUGAR SUPPLEMENT    . testosterone cypionate (DEPOTESTOSTERONE CYPIONATE) 200 MG/ML injection Inject  200 mg into the muscle every 14 (fourteen) days.    . TURMERIC PO Take 1 tablet by mouth daily.    Marland Kitchen venlafaxine XR (EFFEXOR-XR) 150 MG 24 hr capsule Take 150 mg by mouth every evening.     No current facility-administered medications for this visit.     Allergies:   Patient has no known allergies.    Social History:  The patient  reports that he quit smoking about 33 years ago. His smoking use included cigarettes. He has never used smokeless tobacco. He reports that he does not drink alcohol or use drugs.   Family History:  The patient's family history includes Heart attack in his maternal grandfather, mother, paternal grandfather, and paternal grandmother; Other in his father; Stroke in his maternal grandmother.    ROS:  Please see the history of present illness.   Otherwise, review of systems are positive for none.   All other systems are reviewed and negative.    PHYSICAL EXAM: VS:  BP (!) 166/80   Pulse 65   Ht 5\' 10"  (1.778 m)   Wt 245 lb (111.1 kg)   BMI 35.15 kg/m  , BMI Body mass index is 35.15 kg/m. GENERAL:  Well appearing HEENT: Pupils equal round and reactive, fundi not visualized, oral mucosa unremarkable NECK:  No jugular venous distention, waveform within normal limits, carotid upstroke brisk and symmetric, no bruits LUNGS:  Clear to auscultation bilaterally HEART:  RRR.  PMI not displaced or sustained,S1 and S2 within normal limits, no S3, no S4, no clicks, no rubs, no murmurs ABD:  Flat, positive bowel sounds normal in frequency in pitch, no bruits, no rebound, no guarding, no midline pulsatile mass, no hepatomegaly, no splenomegaly EXT:  2 plus pulses throughout, no edema, no cyanosis no clubbing SKIN:  No rashes no nodules NEURO:  Cranial nerves II through XII grossly intact, motor grossly intact throughout PSYCH:  Cognitively intact, oriented to person place and time   EKG:  EKG is ordered today. 02/20/16: Sinus rhythm. Rate 66 bpm. Left ventricular  hypertrophy with repolarization abnormality. 08/30/16:  Sinus rhythm. Rate 66 bpm. LAFB. Poor R wave progression. 04/12/17: Sinus rhythm.  Rate 65 bpm.  Prior anteroseptal infarct.  Nonspecific ST changes.  No recurrent episodes   Echo 11/20/14: Study Conclusions  - Left ventricle: The cavity size was normal. Wall thickness was   increased in a pattern of moderate LVH. Systolic function was   mildly reduced. The estimated ejection fraction was in the range   of 45% to 50%. Inferiorlateral hypokinesis. Abnormal GLPSS at   -11%, with inferolateral strain abnormality. The study is not   technically sufficient to allow evaluation of LV diastolic   function. - Aorta:  Ascending aortic diameter: 40 mm (S). - Ascending aorta: The ascending aorta was mildly dilated. - Left atrium: The atrium was normal in size. - Right atrium: Severely dilated at 30 cm2. - Systemic veins: The IVC measures <2.1 cm, but does not collapse   >50%, suggesting an elevated RA pressure of 8 mmHg.  Lexiscan Myoview 11/27/14:  There was no ST segment deviation noted during stress.  No T wave inversion was noted during stress.  Defect 1: There is a small defect of moderate severity.  Small size, moderate-intensity fixed inferolateral wall defect - could represent artifact or scar. The study was non-gated, therefore LVEF was not calculated and wall motion abnormalities could not be determined. Based on the lack of significant reversible ischemia, this is a low risk study.  Carotid Doppler 02/19/15: 1-39% ICA stenosis bilaterally  Recent Labs: No results found for requested labs within last 8760 hours.    Lipid Panel    Component Value Date/Time   CHOL 193 02/19/2015 0821   TRIG 110 02/19/2015 0821   HDL 36 (L) 02/19/2015 0821   CHOLHDL 5.4 (H) 02/19/2015 0821   VLDL 22 02/19/2015 0821   LDLCALC 135 (H) 02/19/2015 0786     07/21/16: Total cholesterol 105, triglycerides 94, HDL 36, LDL 49  Wt Readings from  Last 3 Encounters:  04/12/17 245 lb (111.1 kg)  08/30/16 245 lb 12.8 oz (111.5 kg)  02/20/16 245 lb (111.1 kg)      ASSESSMENT AND PLAN:  # Atrial fibrillation: No recurrent episodes at this time.  Continue carvedilol and Eliquis.   This patients CHA2DS2-VASc Score and unadjusted Ischemic Stroke Rate (% per year) is equal to 3.2 % stroke rate/year from a score of 3  Above score calculated as 1 point each if present [CHF, HTN, DM, Vascular=MI/PAD/Aortic Plaque, Age if 72-74, or Male.  Above score calculated as 2 points each if present [Age > 75, or Stroke/TIA/TE]  # CAD s/p CABG and PCI: 11/2014 Gary [name] has not expands any chest pain recently.  Cardiolite  showed a fixed defect, either infarct or artifact but no ischemia.  Continue losartan and carvedilol.  No aspirin on Eliquis.    # Carotid stenosis:  Mild carotid stenosis noted on carotid Dopplers 02/2015.  Repeat carotid Dopplers.  # Hypertension: Blood pressure is above goal.  He reports that it has been well controlled at home.  He will check his blood pressures twice daily for 2-4 weeks and bring this to a follow-up appointment with our pharmacist.  He will also bring his blood pressure machine at that appointment.  Goal is <130/80.  # Hyperlipidemia: Lipids are very well-controlled since starting Praluent. Repeat lipids 07/2017.  # Aortic aneurysm: Ascending aorta was 4.0 cm 11/2013 and 03/2016.  The patient does not have concerns regarding medicines.  The following changes have been made:  no change  Labs/ tests ordered today include:    Disposition:   FU with Brevon Dewald C. Oval Linsey, MD in  3 months.  PharmD in 2-4 weeks.    Signed, [name], MD  04/12/2017 8:51 AM    [place]

## 2017-04-18 ENCOUNTER — Ambulatory Visit (HOSPITAL_COMMUNITY)
Admission: RE | Admit: 2017-04-18 | Discharge: 2017-04-18 | Disposition: A | Discharge status: Home / Self Care | Payer: Medicare Other | Source: Ambulatory Visit | Attending: Cardiovascular Disease | Admitting: Cardiovascular Disease

## 2017-04-18 DIAGNOSIS — I6523 Occlusion and stenosis of bilateral carotid arteries: Secondary | ICD-10-CM | POA: Insufficient documentation

## 2017-05-03 ENCOUNTER — Encounter (HOSPITAL_BASED_OUTPATIENT_CLINIC_OR_DEPARTMENT_OTHER): Payer: Self-pay | Admitting: Emergency Medicine

## 2017-05-03 ENCOUNTER — Emergency Department (HOSPITAL_BASED_OUTPATIENT_CLINIC_OR_DEPARTMENT_OTHER): Payer: Medicare Other

## 2017-05-03 ENCOUNTER — Other Ambulatory Visit (HOSPITAL_BASED_OUTPATIENT_CLINIC_OR_DEPARTMENT_OTHER): Payer: Self-pay | Admitting: Radiology

## 2017-05-03 ENCOUNTER — Other Ambulatory Visit: Payer: Self-pay

## 2017-05-03 ENCOUNTER — Inpatient Hospital Stay (HOSPITAL_BASED_OUTPATIENT_CLINIC_OR_DEPARTMENT_OTHER)
Admission: EM | Admit: 2017-05-03 | Discharge: 2017-05-06 | DRG: 291 | Disposition: A | Discharge status: Home / Self Care | Payer: Medicare Other | Attending: Internal Medicine | Admitting: Internal Medicine

## 2017-05-03 DIAGNOSIS — I248 Other forms of acute ischemic heart disease: Secondary | ICD-10-CM | POA: Diagnosis present

## 2017-05-03 DIAGNOSIS — I480 Paroxysmal atrial fibrillation: Secondary | ICD-10-CM | POA: Diagnosis not present

## 2017-05-03 DIAGNOSIS — J9600 Acute respiratory failure, unspecified whether with hypoxia or hypercapnia: Secondary | ICD-10-CM | POA: Diagnosis present

## 2017-05-03 DIAGNOSIS — J209 Acute bronchitis, unspecified: Secondary | ICD-10-CM | POA: Diagnosis not present

## 2017-05-03 DIAGNOSIS — R509 Fever, unspecified: Secondary | ICD-10-CM | POA: Diagnosis not present

## 2017-05-03 DIAGNOSIS — I110 Hypertensive heart disease with heart failure: Principal | ICD-10-CM | POA: Diagnosis present

## 2017-05-03 DIAGNOSIS — E785 Hyperlipidemia, unspecified: Secondary | ICD-10-CM | POA: Diagnosis present

## 2017-05-03 DIAGNOSIS — Z8249 Family history of ischemic heart disease and other diseases of the circulatory system: Secondary | ICD-10-CM | POA: Diagnosis not present

## 2017-05-03 DIAGNOSIS — I719 Aortic aneurysm of unspecified site, without rupture: Secondary | ICD-10-CM | POA: Diagnosis present

## 2017-05-03 DIAGNOSIS — I509 Heart failure, unspecified: Secondary | ICD-10-CM | POA: Diagnosis not present

## 2017-05-03 DIAGNOSIS — J810 Acute pulmonary edema: Secondary | ICD-10-CM | POA: Diagnosis not present

## 2017-05-03 DIAGNOSIS — Z955 Presence of coronary angioplasty implant and graft: Secondary | ICD-10-CM

## 2017-05-03 DIAGNOSIS — E039 Hypothyroidism, unspecified: Secondary | ICD-10-CM | POA: Diagnosis not present

## 2017-05-03 DIAGNOSIS — I6529 Occlusion and stenosis of unspecified carotid artery: Secondary | ICD-10-CM | POA: Diagnosis present

## 2017-05-03 DIAGNOSIS — I429 Cardiomyopathy, unspecified: Secondary | ICD-10-CM | POA: Diagnosis present

## 2017-05-03 DIAGNOSIS — Z951 Presence of aortocoronary bypass graft: Secondary | ICD-10-CM

## 2017-05-03 DIAGNOSIS — I2510 Atherosclerotic heart disease of native coronary artery without angina pectoris: Secondary | ICD-10-CM | POA: Diagnosis present

## 2017-05-03 DIAGNOSIS — Z96649 Presence of unspecified artificial hip joint: Secondary | ICD-10-CM | POA: Diagnosis present

## 2017-05-03 DIAGNOSIS — Z823 Family history of stroke: Secondary | ICD-10-CM | POA: Diagnosis not present

## 2017-05-03 DIAGNOSIS — J208 Acute bronchitis due to other specified organisms: Secondary | ICD-10-CM | POA: Diagnosis not present

## 2017-05-03 DIAGNOSIS — I501 Left ventricular failure: Secondary | ICD-10-CM | POA: Diagnosis present

## 2017-05-03 DIAGNOSIS — G4733 Obstructive sleep apnea (adult) (pediatric): Secondary | ICD-10-CM | POA: Diagnosis present

## 2017-05-03 DIAGNOSIS — Z87891 Personal history of nicotine dependence: Secondary | ICD-10-CM | POA: Diagnosis not present

## 2017-05-03 DIAGNOSIS — J811 Chronic pulmonary edema: Secondary | ICD-10-CM | POA: Diagnosis not present

## 2017-05-03 DIAGNOSIS — F329 Major depressive disorder, single episode, unspecified: Secondary | ICD-10-CM | POA: Diagnosis present

## 2017-05-03 DIAGNOSIS — Z7901 Long term (current) use of anticoagulants: Secondary | ICD-10-CM | POA: Diagnosis not present

## 2017-05-03 DIAGNOSIS — I5043 Acute on chronic combined systolic (congestive) and diastolic (congestive) heart failure: Secondary | ICD-10-CM | POA: Diagnosis present

## 2017-05-03 DIAGNOSIS — J9601 Acute respiratory failure with hypoxia: Secondary | ICD-10-CM | POA: Diagnosis not present

## 2017-05-03 DIAGNOSIS — I5033 Acute on chronic diastolic (congestive) heart failure: Secondary | ICD-10-CM | POA: Diagnosis not present

## 2017-05-03 DIAGNOSIS — J9690 Respiratory failure, unspecified, unspecified whether with hypoxia or hypercapnia: Secondary | ICD-10-CM | POA: Diagnosis present

## 2017-05-03 LAB — CBC WITH DIFFERENTIAL/PLATELET
Basophils Absolute: 0 10*3/uL (ref 0.0–0.1)
Basophils Relative: 0 %
Eosinophils Absolute: 0.2 10*3/uL (ref 0.0–0.7)
Eosinophils Relative: 2 %
HEMATOCRIT: 41.3 % (ref 39.0–52.0)
HEMOGLOBIN: 13.9 g/dL (ref 13.0–17.0)
LYMPHS ABS: 0.9 10*3/uL (ref 0.7–4.0)
LYMPHS PCT: 10 %
MCH: 27.7 pg (ref 26.0–34.0)
MCHC: 33.7 g/dL (ref 30.0–36.0)
MCV: 82.3 fL (ref 78.0–100.0)
MONOS PCT: 11 %
Monocytes Absolute: 1 10*3/uL (ref 0.1–1.0)
NEUTROS ABS: 7 10*3/uL (ref 1.7–7.7)
Neutrophils Relative %: 77 %
Platelets: 283 10*3/uL (ref 150–400)
RBC: 5.02 MIL/uL (ref 4.22–5.81)
RDW: 16.1 % — ABNORMAL HIGH (ref 11.5–15.5)
WBC: 9 10*3/uL (ref 4.0–10.5)

## 2017-05-03 LAB — MRSA PCR SCREENING: MRSA BY PCR: NEGATIVE

## 2017-05-03 LAB — URINALYSIS, ROUTINE W REFLEX MICROSCOPIC
BILIRUBIN URINE: NEGATIVE
Glucose, UA: NEGATIVE mg/dL
Ketones, ur: NEGATIVE mg/dL
Leukocytes, UA: NEGATIVE
Nitrite: NEGATIVE
Protein, ur: NEGATIVE mg/dL
SPECIFIC GRAVITY, URINE: 1.02 (ref 1.005–1.030)
pH: 6 (ref 5.0–8.0)

## 2017-05-03 LAB — COMPREHENSIVE METABOLIC PANEL
ALBUMIN: 3.5 g/dL (ref 3.5–5.0)
ALK PHOS: 97 U/L (ref 38–126)
ALT: 25 U/L (ref 17–63)
AST: 31 U/L (ref 15–41)
Anion gap: 9 (ref 5–15)
BILIRUBIN TOTAL: 0.6 mg/dL (ref 0.3–1.2)
BUN: 17 mg/dL (ref 6–20)
CALCIUM: 8.4 mg/dL — AB (ref 8.9–10.3)
CO2: 23 mmol/L (ref 22–32)
Chloride: 104 mmol/L (ref 101–111)
Creatinine, Ser: 0.7 mg/dL (ref 0.61–1.24)
GFR calc non Af Amer: >60 mL/min (ref >60)
GLUCOSE: 166 mg/dL — AB (ref 65–99)
Potassium: 3.6 mmol/L (ref 3.5–5.1)
Sodium: 136 mmol/L (ref 135–145)
TOTAL PROTEIN: 6.9 g/dL (ref 6.5–8.1)

## 2017-05-03 LAB — URINALYSIS, MICROSCOPIC (REFLEX): WBC, UA: NONE SEEN WBC/hpf (ref 0–5)

## 2017-05-03 LAB — INFLUENZA PANEL BY PCR (TYPE A & B)
INFLAPCR: NEGATIVE
Influenza B By PCR: NEGATIVE

## 2017-05-03 LAB — BRAIN NATRIURETIC PEPTIDE: B Natriuretic Peptide: 350.9 pg/mL — ABNORMAL HIGH (ref 0.0–100.0)

## 2017-05-03 LAB — TROPONIN I
Troponin I: 0.07 ng/mL (ref <0.03)
Troponin I: 0.08 ng/mL (ref <0.03)

## 2017-05-03 MED ORDER — FUROSEMIDE 10 MG/ML IJ SOLN
40.0000 mg | Freq: Once | INTRAMUSCULAR | Status: AC
  Administered 2017-05-03: 40 mg via INTRAVENOUS
  Filled 2017-05-03: qty 4

## 2017-05-03 MED ORDER — IPRATROPIUM-ALBUTEROL 0.5-2.5 (3) MG/3ML IN SOLN: 3.0000 mL | RESPIRATORY_TRACT | Status: DC | PRN

## 2017-05-03 MED ORDER — NITROGLYCERIN 2 % TD OINT
0.5000 [in_us] | TOPICAL_OINTMENT | Freq: Once | TRANSDERMAL | Status: DC
  Filled 2017-05-03: qty 1

## 2017-05-03 MED ORDER — VENLAFAXINE HCL ER 150 MG PO CP24
150.0000 mg | ORAL_CAPSULE | Freq: Every evening | ORAL | Status: DC
  Administered 2017-05-04 – 2017-05-05 (×2): 150 mg via ORAL
  Filled 2017-05-03 (×5): qty 1

## 2017-05-03 MED ORDER — ONDANSETRON HCL 4 MG/2ML IJ SOLN: 4.0000 mg | Freq: Four times a day (QID) | INTRAMUSCULAR | Status: DC | PRN

## 2017-05-03 MED ORDER — LEVOTHYROXINE SODIUM 25 MCG PO TABS
137.0000 ug | ORAL_TABLET | Freq: Every day | ORAL | Status: DC
  Administered 2017-05-03 – 2017-05-06 (×4): 137 ug via ORAL
  Filled 2017-05-03 (×6): qty 1

## 2017-05-03 MED ORDER — ADULT MULTIVITAMIN W/MINERALS CH
1.0000 | ORAL_TABLET | Freq: Two times a day (BID) | ORAL | Status: DC
  Administered 2017-05-03 – 2017-05-06 (×6): 1 via ORAL
  Filled 2017-05-03 (×5): qty 1

## 2017-05-03 MED ORDER — ACETAMINOPHEN 325 MG PO TABS
650.0000 mg | ORAL_TABLET | Freq: Once | ORAL | Status: AC
  Administered 2017-05-03: 650 mg via ORAL
  Filled 2017-05-03: qty 2

## 2017-05-03 MED ORDER — APIXABAN 5 MG PO TABS
5.0000 mg | ORAL_TABLET | Freq: Two times a day (BID) | ORAL | Status: DC
  Administered 2017-05-03 – 2017-05-06 (×7): 5 mg via ORAL
  Filled 2017-05-03 (×8): qty 1

## 2017-05-03 MED ORDER — SODIUM CHLORIDE 0.9% FLUSH: 3.0000 mL | INTRAVENOUS | Status: DC | PRN

## 2017-05-03 MED ORDER — LOSARTAN POTASSIUM 25 MG PO TABS
25.0000 mg | ORAL_TABLET | Freq: Every day | ORAL | Status: DC
  Administered 2017-05-04: 25 mg via ORAL
  Filled 2017-05-03: qty 1

## 2017-05-03 MED ORDER — IPRATROPIUM-ALBUTEROL 0.5-2.5 (3) MG/3ML IN SOLN
3.0000 mL | Freq: Four times a day (QID) | RESPIRATORY_TRACT | Status: DC
  Administered 2017-05-03 (×5): 3 mL via RESPIRATORY_TRACT
  Filled 2017-05-03 (×5): qty 3

## 2017-05-03 MED ORDER — FUROSEMIDE 10 MG/ML IJ SOLN
40.0000 mg | Freq: Two times a day (BID) | INTRAMUSCULAR | Status: DC
  Administered 2017-05-03 – 2017-05-04 (×3): 40 mg via INTRAVENOUS
  Filled 2017-05-03 (×4): qty 4

## 2017-05-03 MED ORDER — ACETAMINOPHEN 500 MG PO TABS
1000.0000 mg | ORAL_TABLET | Freq: Once | ORAL | Status: AC
  Administered 2017-05-03: 1000 mg via ORAL
  Filled 2017-05-03: qty 2

## 2017-05-03 MED ORDER — GUAIFENESIN-DM 100-10 MG/5ML PO SYRP
5.0000 mL | ORAL_SOLUTION | Freq: Four times a day (QID) | ORAL | Status: DC
  Administered 2017-05-03: 5 mL via ORAL
  Filled 2017-05-03: qty 10

## 2017-05-03 MED ORDER — BENZONATATE 100 MG PO CAPS
100.0000 mg | ORAL_CAPSULE | Freq: Once | ORAL | Status: AC
  Administered 2017-05-03: 100 mg via ORAL
  Filled 2017-05-03: qty 1

## 2017-05-03 MED ORDER — BENZONATATE 100 MG PO CAPS
200.0000 mg | ORAL_CAPSULE | Freq: Once | ORAL | Status: AC
  Administered 2017-05-03: 200 mg via ORAL
  Filled 2017-05-03: qty 2

## 2017-05-03 MED ORDER — ACETAMINOPHEN 325 MG PO TABS: 650.0000 mg | ORAL_TABLET | Freq: Four times a day (QID) | ORAL | Status: DC | PRN

## 2017-05-03 MED ORDER — POTASSIUM CHLORIDE CRYS ER 20 MEQ PO TBCR
40.0000 meq | EXTENDED_RELEASE_TABLET | Freq: Once | ORAL | Status: AC
  Administered 2017-05-03: 40 meq via ORAL
  Filled 2017-05-03: qty 2

## 2017-05-03 MED ORDER — LOSARTAN POTASSIUM 25 MG PO TABS
25.0000 mg | ORAL_TABLET | Freq: Two times a day (BID) | ORAL | Status: DC
  Administered 2017-05-03: 25 mg via ORAL
  Filled 2017-05-03 (×3): qty 1

## 2017-05-03 MED ORDER — ONDANSETRON HCL 4 MG PO TABS: 4.0000 mg | ORAL_TABLET | Freq: Four times a day (QID) | ORAL | Status: DC | PRN

## 2017-05-03 MED ORDER — SODIUM CHLORIDE 0.9 % IV SOLN: 250.0000 mL | INTRAVENOUS | Status: DC | PRN

## 2017-05-03 MED ORDER — ACETAMINOPHEN 650 MG RE SUPP: 650.0000 mg | Freq: Four times a day (QID) | RECTAL | Status: DC | PRN

## 2017-05-03 MED ORDER — SODIUM CHLORIDE 0.9% FLUSH: 3.0000 mL | Freq: Two times a day (BID) | INTRAVENOUS | Status: DC

## 2017-05-03 MED ORDER — CARVEDILOL 12.5 MG PO TABS
12.5000 mg | ORAL_TABLET | Freq: Two times a day (BID) | ORAL | Status: DC
  Administered 2017-05-03 – 2017-05-06 (×6): 12.5 mg via ORAL
  Filled 2017-05-03 (×7): qty 1

## 2017-05-03 NOTE — ED Notes (Signed)
Pt's wife to nurse's station and sts "monitor says afib"; HR noted to be irregular, rate in the 60s; Pt denies pain or other change in condition. EDP made aware.

## 2017-05-03 NOTE — ED Provider Notes (Addendum)
[facility] EMERGENCY DEPARTMENT Provider Note   CSN: [id number] Arrival date & time: 05/03/17  0401     History   Chief Complaint Chief Complaint  Patient presents with  . Shortness of Breath    HPI Gary [name] is a 71 y.o. male.  The history is provided by the patient and the spouse.  Shortness of Breath  This is a new problem. The average episode lasts 6 days. The problem occurs continuously.The current episode started more than 2 days ago. The problem has been gradually worsening. Associated symptoms include a fever, cough and wheezing. Pertinent negatives include no rhinorrhea, no sore throat, no ear pain, no sputum production, no hemoptysis, no chest pain, no syncope, no vomiting, no abdominal pain, no rash, no leg pain and no leg swelling. It is unknown what precipitated the problem. Treatments tried: coricidin hbp. The treatment provided no relief.  Patient with known CAD status post stenting and CABG and CHF who presents with cough congestion and fever.  Wife states patient has an non productive cough since Wednesday and with chest congestion.  Patient states it started more like Friday.  Patient reports fevers on Sunday, then went away on Monday and now have returned.  States he just completed treatment for a sinus infection.  No leg swelling/pain nor chest pain.  No abdominal pain.  No n/v/d.    Past Medical History:  Diagnosis Date  . Aortic aneurysm ([place]) 02/20/2016   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  . Arthritis   . Atrial fibrillation ([place]) 11/13/2014  . Cancer ([place])    skin  . Chronic combined systolic and diastolic heart failure ([place]) 02/20/2016   LVEF 45-50% 11/2014  . Coronary artery disease   . Depression   . Essential hypertension 11/13/2014  . Hyperlipidemia   . Hyperlipidemia 11/13/2014  . Hypertension   . Sleep apnea    CPAP 'Uses half the time"  last study 2009    Patient Active Problem List   Diagnosis Date Noted  . Chronic combined  systolic and diastolic heart failure ([place]) 02/20/2016  . Aortic aneurysm ([place]) 02/20/2016  . CAD (coronary artery disease) 11/13/2014  . Essential hypertension 11/13/2014  . Hyperlipidemia 11/13/2014  . Atrial fibrillation ([place]) 11/13/2014  . Osteoarthritis of right hip 01/03/2012    Past Surgical History:  Procedure Laterality Date  . CARDIOVERSION N/A 12/06/2014   Procedure: CARDIOVERSION;  Surgeon: [name], MD;  Location: [facility];  Service: Cardiovascular;  Laterality: N/A;  . CARPAL TUNNEL RELEASE    . CORONARY ANGIOPLASTY     multiple stents  . CORONARY ARTERY BYPASS GRAFT  2006  . EYE SURGERY     right eye-  muscular repair  . TOTAL HIP ARTHROPLASTY  01/03/2012   Procedure: TOTAL HIP ARTHROPLASTY;  Surgeon: [name], MD;  Location: [facility];  Service: Orthopedics;  Laterality: Right;        Home Medications    Prior to Admission medications   Medication Sig Start Date End Date Taking? Authorizing Provider  Alirocumab (PRALUENT) 150 MG/ML SOPN Inject 1 pen into the skin every 14 (fourteen) days. 03/09/16   [name], MD  apixaban (ELIQUIS) 5 MG TABS tablet Take 1 tablet (5 mg total) by mouth 2 (two) times daily. 08/19/16   [name], MD  B Complex Vitamins (B COMPLEX PO) Take 1 tablet by mouth daily.    [provider]  carvedilol (COREG) 12.5 MG tablet Take 12.5 mg by mouth 2 (two)  times daily with a meal.    [provider]  Cholecalciferol (VITAMIN D3) 5000 UNITS TABS Take 5,000 Units by mouth daily.    [provider]  levothyroxine (SYNTHROID, LEVOTHROID) 137 MCG tablet Take 137 mcg by mouth daily. Take 1 1/2 tablet only on wednesday 10/27/14   [provider]  losartan (COZAAR) 25 MG tablet Take 25 mg by mouth 2 (two) times daily.    [provider]  Misc Natural Products (TART CHERRY ADVANCED PO) Take 1,200 mg by mouth 2 (two) times daily.    [provider]  Multiple Vitamin  (MULTIVITAMIN WITH MINERALS) TABS tablet Take 1 tablet by mouth 2 (two) times daily.    [provider]  OVER THE COUNTER MEDICATION Take 1 tablet by mouth at bedtime. Take BLOOD SUGAR SUPPLEMENT    [provider]  testosterone cypionate (DEPOTESTOSTERONE CYPIONATE) 200 MG/ML injection Inject 200 mg into the muscle every 14 (fourteen) days. 10/09/14   [provider]  TURMERIC PO Take 1 tablet by mouth daily.    [provider]  venlafaxine XR (EFFEXOR-XR) 150 MG 24 hr capsule Take 150 mg by mouth every evening.    [provider]    Family History Family History  Problem Relation Age of Onset  . Heart attack Mother   . Other Father        odd stomach disorder  . Stroke Maternal Grandmother   . Heart attack Maternal Grandfather   . Heart attack Paternal Grandmother   . Heart attack Paternal Grandfather     Social History Social History   Tobacco Use  . Smoking status: Former Smoker    Types: Cigarettes    Last attempt to quit: 12/24/1983    Years since quitting: 33.3  . Smokeless tobacco: Never Used  Substance Use Topics  . Alcohol use: No    Alcohol/week: 0.0 oz  . Drug use: No     Allergies   Patient has no known allergies.   Review of Systems Review of Systems  Constitutional: Positive for fever. Negative for diaphoresis.  HENT: Negative for ear pain, facial swelling, rhinorrhea and sore throat.   Respiratory: Positive for cough, shortness of breath and wheezing. Negative for hemoptysis, sputum production and stridor.   Cardiovascular: Negative for chest pain, palpitations, leg swelling and syncope.  Gastrointestinal: Negative for abdominal pain and vomiting.  Genitourinary: Negative for dysuria.  Skin: Negative for rash.  All other systems reviewed and are negative.    Physical Exam Updated Vital Signs BP (!) 180/84 (BP Location: Right Arm)   Pulse 77   Temp (!) 101.8 F (38.8 C) (Oral)   Resp (!) 32   Ht 5'  10" (1.778 m)   Wt 108.9 kg (240 lb)   SpO2 94%   BMI 34.44 kg/m   Physical Exam  Constitutional: He is oriented to person, place, and time. He appears well-developed and well-nourished. No distress.  HENT:  Head: Normocephalic and atraumatic.  Nose: Nose normal.  Mouth/Throat: No oropharyngeal exudate.  Eyes: Pupils are equal, round, and reactive to light. Conjunctivae are normal.  Neck: Normal range of motion. Neck supple. No JVD present.  Cardiovascular: Normal rate, regular rhythm, normal heart sounds and intact distal pulses.  Pulmonary/Chest: Tachypnea noted. He has wheezes. He has rhonchi.  Abdominal: Soft. Bowel sounds are normal. He exhibits no mass. There is no tenderness. There is no rebound and no guarding.  Musculoskeletal: Normal range of motion.  Lymphadenopathy:  He has no cervical adenopathy.  Neurological: He is alert and oriented to person, place, and time. He displays normal reflexes.  Skin: Skin is warm and dry. Capillary refill takes less than 2 seconds.  Psychiatric: He has a normal mood and affect.     ED Treatments / Results  Labs (all labs ordered are listed, but only abnormal results are displayed) Results for orders placed or performed during the hospital encounter of 05/03/17  CBC with Differential/Platelet  Result Value Ref Range   WBC 9.0 4.0 - 10.5 K/uL   RBC 5.02 4.22 - 5.81 MIL/uL   Hemoglobin 13.9 13.0 - 17.0 g/dL   HCT 41.3 39.0 - 52.0 %   MCV 82.3 78.0 - 100.0 fL   MCH 27.7 26.0 - 34.0 pg   MCHC 33.7 30.0 - 36.0 g/dL   RDW 16.1 (H) 11.5 - 15.5 %   Platelets 283 150 - 400 K/uL   Neutrophils Relative % 77 %   Neutro Abs 7.0 1.7 - 7.7 K/uL   Lymphocytes Relative 10 %   Lymphs Abs 0.9 0.7 - 4.0 K/uL   Monocytes Relative 11 %   Monocytes Absolute 1.0 0.1 - 1.0 K/uL   Eosinophils Relative 2 %   Eosinophils Absolute 0.2 0.0 - 0.7 K/uL   Basophils Relative 0 %   Basophils Absolute 0.0 0.0 - 0.1 K/uL  Comprehensive metabolic panel    Result Value Ref Range   Sodium 136 135 - 145 mmol/L   Potassium 3.6 3.5 - 5.1 mmol/L   Chloride 104 101 - 111 mmol/L   CO2 23 22 - 32 mmol/L   Glucose, Bld 166 (H) 65 - 99 mg/dL   BUN 17 6 - 20 mg/dL   Creatinine, Ser 0.70 0.61 - 1.24 mg/dL   Calcium 8.4 (L) 8.9 - 10.3 mg/dL   Total Protein 6.9 6.5 - 8.1 g/dL   Albumin 3.5 3.5 - 5.0 g/dL   AST 31 15 - 41 U/L   ALT 25 17 - 63 U/L   Alkaline Phosphatase 97 38 - 126 U/L   Total Bilirubin 0.6 0.3 - 1.2 mg/dL   GFR calc non Af Amer >60 >60 mL/min   GFR calc Af Amer >60 >60 mL/min   Anion gap 9 5 - 15  Troponin I  Result Value Ref Range   Troponin I 0.07 (HH) <0.03 ng/mL  Brain natriuretic peptide  Result Value Ref Range   B Natriuretic Peptide 350.9 (H) 0.0 - 100.0 pg/mL   No results found.  EKG EKG Interpretation  Date/Time:  Tuesday Tamecka Milham 30 2019 04:13:18 EDT Ventricular Rate:  73 PR Interval:    QRS Duration: 95 QT Interval:  377 QTC Calculation: 416 R Axis:   33 Text Interpretation:  Sinus rhythm Probable anteroseptal infarct, old Confirmed by Randal Buba, Davelyn Gwinn ([id number]) on 05/03/2017 4:20:19 AM  Procedures Procedures (including critical care time)  Medications Ordered in ED Medications  ipratropium-albuterol (DUONEB) 0.5-2.5 (3) MG/3ML nebulizer solution 3 mL (has no administration in time range)  nitroGLYCERIN (NITROGLYN) 2 % ointment 0.5 inch (has no administration in time range)  acetaminophen (TYLENOL) tablet 1,000 mg (1,000 mg Oral Given 05/03/17 0432)  benzonatate (TESSALON) capsule 200 mg (200 mg Oral Given 05/03/17 0527)  furosemide (LASIX) injection 40 mg (40 mg Intravenous Given 05/03/17 0532)    MDM Reviewed: previous chart, nursing note and vitals Interpretation: labs, ECG and x-ray (elevated BNP and troponin, negative white count.  Pulmonary edema by me on cxr) Total time providing  critical care: 75-105 minutes (bipap initiated by me in the ED). This excludes time spent performing separately reportable  procedures and services. Consults: admitting MD   CRITICAL CARE Performed by: Carlisle Beers Total critical care time: 75 minutes Critical care time was exclusive of separately billable procedures and treating other patients. Critical care was necessary to treat or prevent imminent or life-threatening deterioration. Critical care was time spent personally by me on the following activities: development of treatment plan with patient and/or surrogate as well as nursing, discussions with consultants, evaluation of patient's response to treatment, examination of patient, obtaining history from patient or surrogate, ordering and performing treatments and interventions, ordering and review of laboratory studies, ordering and review of radiographic studies, pulse oximetry and re-evaluation of patient's condition.  Final Clinical Impressions(s) / ED Diagnoses   Influenza sent.  CXR, exam, vitals and labs are consistent with pulmonary edema.  Patient likely has secondary problem in the form of a viral febrile illness.  The patient is definitively not septic, patient is hypertensive with normal HR.  RR is elevated secondary to pulmonary edema.  Patient denies, urinary and abdominal complaints.  UA is negative for UTI.  Will admit to step down as on BIPAP which has markedly decreased patient's WOB.     Mykayla Brinton, MD 05/03/17 [id number]    Veatrice Kells, MD 05/03/17 [place], Caylyn Tedeschi, MD 05/03/17 0569

## 2017-05-03 NOTE — ED Notes (Signed)
Date and time results received: 05/03/17 0506 (use smartphrase ".now" to insert current time)  Test: troponin Critical Value: 0.07  Name of Provider Notified: Dr. Randal Buba  Orders Received? Or Actions Taken?: no new orders

## 2017-05-03 NOTE — Plan of Care (Signed)
72 yo M presents to ED with cough of several days duration.  Has fever, cough started several days before fever, non productive.  Today CXR shows pulm edema.  EDP and radiologist "no pneumonia on x ray" despite fever.  Patient on BIPAP.  Got lasix, flu PCR pending.  Hypertensive with nl HR in ED.  Will put on list for transfer to SDU, no SDUs at [facility] either.

## 2017-05-03 NOTE — ED Triage Notes (Signed)
Pt c/o cold like symptoms since last Friday, with chest congestion, SOB and fever. Denies any pain at this time.

## 2017-05-03 NOTE — H&P (Signed)
History and Physical    AVON MERGENTHALER BJS:[id number] DOB: June 13, 1945 DOA: 05/03/2017  PCP: [name], MD   Patient coming from: [facility]  Chief Complaint: dyspnea   HPI: Gary [name] is a 72 y.o. male with medical history significant for hypertension, coronary artery disease status post PCI and CABG, diastolic heart failure, dyslipidemia, paroxysmal atrial fibrillation and sleep apnea.  Patient has been developing worsening dyspnea for the last 6 days prior to hospitalization, moderate to severe in intensity, no improving factors, worse with exertion and coughing, reports about 6 pound weight gain, orthopnea and PND.  No lower extremity edema.  Over the last 48 hours he was noted to have elevated temperature up to 100 F.  Denies any sick contacts.  He was seen at the [facility], and referred for further hospitalization.    ED Course: Patient was found volume overloaded, received 40 mg of intravenous furosemide.  He was referred for further admission and management.  Review of Systems:  1. General: Positive for chills, and elevated temperature, positive weight gain 6 lbs.   2. ENT: No runny nose or sore throat, no hearing disturbances 3. Pulmonary: positive dyspnea, dry cough, and severe wheezing, no hemoptysis 4. Cardiovascular: No angina, claudication, lower extremity edema, positive pnd and orthopnea 5. Gastrointestinal: No nausea or vomiting, no diarrhea or constipation 6. Hematology: No easy bruisability or frequent infections 7. Urology: No dysuria, hematuria or increased urinary frequency 8. Dermatology: No rashes. 9. Neurology: No seizures or paresthesias 10. Musculoskeletal: No joint pain or deformities  Past Medical History:  Diagnosis Date  . Aortic aneurysm ([place]) 02/20/2016   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  . Arthritis   . Atrial fibrillation ([place]) 11/13/2014  . Cancer ([place])    skin  . Chronic combined  systolic and diastolic heart failure ([place]) 02/20/2016   LVEF 45-50% 11/2014  . Coronary artery disease   . Depression   . Essential hypertension 11/13/2014  . Hyperlipidemia   . Hyperlipidemia 11/13/2014  . Hypertension   . Sleep apnea    CPAP 'Uses half the time"  last study 2009    Past Surgical History:  Procedure Laterality Date  . CARDIOVERSION N/A 12/06/2014   Procedure: CARDIOVERSION;  Surgeon: [name], MD;  Location: [place];  Service: Cardiovascular;  Laterality: N/A;  . CARPAL TUNNEL RELEASE    . CORONARY ANGIOPLASTY     multiple stents  . CORONARY ARTERY BYPASS GRAFT  2006  . EYE SURGERY     right eye-  muscular repair  . TOTAL HIP ARTHROPLASTY  01/03/2012   Procedure: TOTAL HIP ARTHROPLASTY;  Surgeon: [name], MD;  Location: [facility];  Service: Orthopedics;  Laterality: Right;     reports that he quit smoking about 33 years ago. His smoking use included cigarettes. He has never used smokeless tobacco. He reports that he does not drink alcohol or use drugs.  No Known Allergies  Family History  Problem Relation Age of Onset  . Heart attack Mother   . Other Father        odd stomach disorder  . Stroke Maternal Grandmother   . Heart attack Maternal Grandfather   . Heart attack Paternal Grandmother   . Heart attack Paternal Grandfather      Prior to Admission medications   Medication Sig Start Date End Date Taking? Authorizing Provider  apixaban (ELIQUIS) 5 MG TABS tablet Take 1 tablet (5 mg total) by mouth  2 (two) times daily. 08/19/16  Yes [name], MD  B Complex Vitamins (B COMPLEX PO) Take 1 tablet by mouth daily.   Yes [provider]  carvedilol (COREG) 12.5 MG tablet Take 12.5 mg by mouth 2 (two) times daily with a meal.   Yes [provider]  Cholecalciferol (VITAMIN D3) 5000 UNITS TABS Take 5,000 Units by mouth daily.   Yes [provider]  Dextromethorphan-guaiFENesin (CORICIDIN HBP CONGESTION/COUGH)  10-200 MG CAPS Take 2 tablets by mouth as needed.   Yes [provider]  ibuprofen (ADVIL,MOTRIN) 200 MG tablet Take 200 mg by mouth every 6 (six) hours as needed.   Yes [provider]  levothyroxine (SYNTHROID, LEVOTHROID) 137 MCG tablet Take 137 mcg by mouth daily. Take 1 1/2 tablet only on wednesday 10/27/14  Yes [provider]  losartan (COZAAR) 25 MG tablet Take 25 mg by mouth daily.    Yes [provider]  Multiple Vitamin (MULTIVITAMIN WITH MINERALS) TABS tablet Take 1 tablet by mouth 2 (two) times daily.   Yes [provider]  OVER THE COUNTER MEDICATION Take 1 tablet by mouth 2 (two) times daily. Take BLOOD SUGAR SUPPLEMENT    Yes [provider]  OVER THE COUNTER MEDICATION Take 1 tablet by mouth 2 (two) times daily.   Yes [provider]  sodium chloride (OCEAN) 0.65 % SOLN nasal spray Place 1 spray into both nostrils daily.   Yes [provider]  TURMERIC PO Take 500 mg by mouth 2 (two) times daily.    Yes [provider]  venlafaxine XR (EFFEXOR-XR) 150 MG 24 hr capsule Take 150 mg by mouth every morning.    Yes [provider]  Alirocumab (PRALUENT) 150 MG/ML SOPN Inject 1 pen into the skin every 14 (fourteen) days. Patient taking differently: Inject 1 pen into the skin every 14 (fourteen) days. On Thursday 03/09/16   [name], MD  testosterone cypionate (DEPOTESTOSTERONE CYPIONATE) 200 MG/ML injection Inject 200 mg into the muscle every 14 (fourteen) days. 10/09/14   [provider]    Physical Exam: Vitals:   05/03/17 1415 05/03/17 1500 05/03/17 1505 05/03/17 1548  BP: 125/76 140/66    Pulse: 73 65    Resp: (!) 27 (!) 23    Temp:   99.2 F (37.3 C)   TempSrc:   Oral   SpO2: 96% 94%  100%  Weight:      Height:        Constitutional: deconditioned and ill looking appearing Vitals:   05/03/17 1415 05/03/17 1500 05/03/17 1505 05/03/17 1548  BP: 125/76 140/66      Pulse: 73 65    Resp: (!) 27 (!) 23    Temp:   99.2 F (37.3 C)   TempSrc:   Oral   SpO2: 96% 94%  100%  Weight:      Height:       Eyes: PERRL, lids and conjunctivae normal Head normocephalic, nose and ears with no deformities ENMT: Mucous membranes are dry. Posterior pharynx clear of any exudate or lesions.Normal dentition.  Neck: normal, supple, no masses, no thyromegaly Respiratory: decreased breath sounds auscultation bilaterally, positive expiratory wheezing, basilar crackles. Normal respiratory effort. No accessory muscle use.  Cardiovascular: Regular rate and rhythm, no murmurs / rubs / gallops. No extremity edema. 2+ pedal pulses. No carotid bruits.  Abdomen: protuberant with no tenderness, no masses palpated. No hepatosplenomegaly. Bowel sounds positive.  Musculoskeletal: no clubbing / cyanosis. No joint deformity upper  and lower extremities. Good ROM, no contractures. Normal muscle tone.  Skin: no rashes, lesions, ulcers. No induration Neurologic: CN 2-12 grossly intact. Sensation intact, DTR normal. Strength 5/5 in all 4.     Labs on Admission: I have personally reviewed following labs and imaging studies  CBC: Recent Labs  Lab 05/03/17 0424  WBC 9.0  NEUTROABS 7.0  HGB 13.9  HCT 41.3  MCV 82.3  PLT 132   Basic Metabolic Panel: Recent Labs  Lab 05/03/17 0424  NA 136  K 3.6  CL 104  CO2 23  GLUCOSE 166*  BUN 17  CREATININE 0.70  CALCIUM 8.4*   GFR: Estimated Creatinine Clearance: 100.1 mL/min (by C-G formula based on SCr of 0.7 mg/dL). Liver Function Tests: Recent Labs  Lab 05/03/17 0424  AST 31  ALT 25  ALKPHOS 97  BILITOT 0.6  PROT 6.9  ALBUMIN 3.5   No results for input(s): LIPASE, AMYLASE in the last 168 hours. No results for input(s): AMMONIA in the last 168 hours. Coagulation Profile: No results for input(s): INR, PROTIME in the last 168 hours. Cardiac Enzymes: Recent Labs  Lab 05/03/17 0428 05/03/17 1022  TROPONINI 0.07* 0.08*    BNP (last 3 results) No results for input(s): PROBNP in the last 8760 hours. HbA1C: No results for input(s): HGBA1C in the last 72 hours. CBG: No results for input(s): GLUCAP in the last 168 hours. Lipid Profile: No results for input(s): CHOL, HDL, LDLCALC, TRIG, CHOLHDL, LDLDIRECT in the last 72 hours. Thyroid Function Tests: No results for input(s): TSH, T4TOTAL, FREET4, T3FREE, THYROIDAB in the last 72 hours. Anemia Panel: No results for input(s): VITAMINB12, FOLATE, FERRITIN, TIBC, IRON, RETICCTPCT in the last 72 hours. Urine analysis:    Component Value Date/Time   COLORURINE YELLOW 05/03/2017 0610   APPEARANCEUR CLEAR 05/03/2017 0610   LABSPEC 1.020 05/03/2017 0610   PHURINE 6.0 05/03/2017 0610   GLUCOSEU NEGATIVE 05/03/2017 0610   HGBUR TRACE (A) 05/03/2017 0610   BILIRUBINUR NEGATIVE 05/03/2017 0610   KETONESUR NEGATIVE 05/03/2017 0610   PROTEINUR NEGATIVE 05/03/2017 0610   UROBILINOGEN 0.2 12/24/2011 0805   NITRITE NEGATIVE 05/03/2017 0610   LEUKOCYTESUR NEGATIVE 05/03/2017 0610    Radiological Exams on Admission: Dg Chest 2 View  Result Date: 05/03/2017 CLINICAL DATA:  Cold like symptoms for 5 days. Fever, shortness of breath, and chest congestion. EXAM: CHEST - 2 VIEW COMPARISON:  12/24/2011 FINDINGS: Postoperative changes in the mediastinum. Cardiac enlargement with prominent pulmonary vascular congestion and diffuse interstitial edema most prominent in the bases. No focal consolidation. No blunting of costophrenic angles. No pneumothorax. Degenerative changes in the spine and shoulders. IMPRESSION: Congestive changes with cardiac enlargement, pulmonary vascular congestion, and interstitial edema. No focal consolidation. Electronically Signed   By: Lucienne Capers M.D.   On: 05/03/2017 05:53    EKG: Independently reviewed.  Sinus rhythm, normal axis, normal intervals, poor R wave progression.  Assessment/Plan Active Problems:   Acute respiratory failure (HCC)    Pulmonary edema cardiac cause (HCC)   Respiratory failure ([place])  71 year old male who presents with worsening dyspnea for the last 6 days, associated with wheezing, PND, orthopnea and 6 pound weight gain.  On his initial physical examination his temperature was 101.8 F, blood pressure 180/84, heart rate 77, respiratory rate 32, oxygen saturation 94% on room air.  Dry mucous membranes, unable to appreciate JVD, his lungs had decreased breath sounds, poor air movement and positive expiratory wheezing, and bibasilar rales.  Heart S1-S2 present and rhythmic,  the abdomen protuberant, nontender, no lower extremity edema.  Sodium 136, potassium 3.6, chloride 104, bicarb 23, glucose 166, BUN 17, creatinine 0.70, troponin 0.07, BNP 350, white count 9.0, hemoglobin 13.9, hematocrit 41.3, platelets 283.  Influenza a and B were negative.  Urinalysis specific gravity 1.020, negative protein, 0-5 red cells, no white cells.  EKG sinus rhythm, no ischemic changes.  Chest x-ray with cardiomegaly, increase bilateral interstitial markings, vascular congestion, fluid in the right fissure, peribronchial cuffing.   Patient will be admitted to the stepdown unit with the working diagnosis of decompensated acute on chronic systolic heart failure, complicated by cardiogenic pulmonary edema and fever.   1.  Acute on chronic diastolic heart failure decompensation.  Patient will be admitted to the stepdown unit, aggressive diuresis with furosemide, 40 mg intravenously twice daily, will target a negative fluid balance.  Continue heart failure management with carvedilol 12.5 g twice daily, and 25 mg of losartan daily.  Strict and outs plus daily weights.  Last echocardiography March 2018 with left ventricle ejection fraction 45 to 50%, diffuse hypokinesis (old records personally reviewed).  Will repeat echocardiography on this admission.  Close monitoring of electrolytes, target potassium of 4 and magnesium of 2.   2.  Acute  cardiogenic pulmonary edema.  Present on admission.  Will continue with aggressive diuresis with furosemide, oximetry monitoring and submental oxygen per nasal cannula.  Bronchodilator therapy every 6 hours and as needed every 2 hours.  3.  Fever.  Patient tested negative for influenza, will send a viral respiratory panel, it is likely that a viral illness had a trigger his heart failure decompensation.  Continue supportive medical care, cough suppressive therapy.   4.  Paroxysmal atrial fibrillation.  Currently patient's heart rate is controlled with carvedilol, continue anticoagulation with apixaban, close telemetry monitoring.  5.  Hypothyroidism.  Continue levothyroxine  6.  Depression.  Continue venlafaxine.    DVT prophylaxis: apixaban Code Status:  full  Family Communication: I spoke with patient's wife at the bedside and all questions were addressed.   Disposition Plan: Inpatient step down unit  Consults called: none  Admission status: Inpatient     Sabin Gibeault Gerome Apley MD [facility] Pager [phone]  If 7PM-7AM, please contact night-coverage [email or web address] Password [email or web address]  05/03/2017, 5:30 PM

## 2017-05-03 NOTE — ED Notes (Signed)
Pt removed from Bipap at this time. No complications noted. Pt placed on 2lpm .

## 2017-05-04 ENCOUNTER — Inpatient Hospital Stay (HOSPITAL_COMMUNITY): Payer: Medicare Other

## 2017-05-04 DIAGNOSIS — I5043 Acute on chronic combined systolic (congestive) and diastolic (congestive) heart failure: Secondary | ICD-10-CM

## 2017-05-04 DIAGNOSIS — I480 Paroxysmal atrial fibrillation: Secondary | ICD-10-CM

## 2017-05-04 LAB — COMPREHENSIVE METABOLIC PANEL
ALT: 23 U/L (ref 17–63)
AST: 29 U/L (ref 15–41)
Albumin: 3.5 g/dL (ref 3.5–5.0)
Alkaline Phosphatase: 81 U/L (ref 38–126)
Anion gap: 10 (ref 5–15)
BILIRUBIN TOTAL: 0.4 mg/dL (ref 0.3–1.2)
BUN: 13 mg/dL (ref 6–20)
CO2: 25 mmol/L (ref 22–32)
CREATININE: 0.8 mg/dL (ref 0.61–1.24)
Calcium: 8.8 mg/dL — ABNORMAL LOW (ref 8.9–10.3)
Chloride: 104 mmol/L (ref 101–111)
GFR calc Af Amer: >60 mL/min (ref >60)
Glucose, Bld: 127 mg/dL — ABNORMAL HIGH (ref 65–99)
POTASSIUM: 3.6 mmol/L (ref 3.5–5.1)
Sodium: 139 mmol/L (ref 135–145)
TOTAL PROTEIN: 7.5 g/dL (ref 6.5–8.1)

## 2017-05-04 LAB — RESPIRATORY PANEL BY PCR
Adenovirus: NOT DETECTED
BORDETELLA PERTUSSIS-RVPCR: NOT DETECTED
CHLAMYDOPHILA PNEUMONIAE-RVPPCR: NOT DETECTED
Coronavirus 229E: NOT DETECTED
Coronavirus HKU1: NOT DETECTED
Coronavirus NL63: NOT DETECTED
Coronavirus OC43: NOT DETECTED
INFLUENZA A-RVPPCR: NOT DETECTED
Influenza B: NOT DETECTED
Metapneumovirus: DETECTED — AB
Mycoplasma pneumoniae: NOT DETECTED
PARAINFLUENZA VIRUS 3-RVPPCR: NOT DETECTED
PARAINFLUENZA VIRUS 4-RVPPCR: NOT DETECTED
Parainfluenza Virus 1: NOT DETECTED
Parainfluenza Virus 2: NOT DETECTED
RHINOVIRUS / ENTEROVIRUS - RVPPCR: NOT DETECTED
Respiratory Syncytial Virus: NOT DETECTED

## 2017-05-04 LAB — ECHOCARDIOGRAM COMPLETE
Height: 70 in
Weight: 3858.93 oz

## 2017-05-04 LAB — MAGNESIUM: MAGNESIUM: 1.8 mg/dL (ref 1.7–2.4)

## 2017-05-04 MED ORDER — GUAIFENESIN-DM 100-10 MG/5ML PO SYRP
5.0000 mL | ORAL_SOLUTION | ORAL | Status: DC | PRN
  Administered 2017-05-04 – 2017-05-05 (×4): 5 mL via ORAL
  Filled 2017-05-04 (×4): qty 10

## 2017-05-04 MED ORDER — LOSARTAN POTASSIUM 50 MG PO TABS
50.0000 mg | ORAL_TABLET | Freq: Every day | ORAL | Status: DC
  Administered 2017-05-05 – 2017-05-06 (×2): 50 mg via ORAL
  Filled 2017-05-04 (×2): qty 1

## 2017-05-04 MED ORDER — IPRATROPIUM-ALBUTEROL 0.5-2.5 (3) MG/3ML IN SOLN
3.0000 mL | Freq: Four times a day (QID) | RESPIRATORY_TRACT | Status: DC
  Administered 2017-05-04: 3 mL via RESPIRATORY_TRACT
  Filled 2017-05-04: qty 3

## 2017-05-04 MED ORDER — PERFLUTREN LIPID MICROSPHERE
1.0000 mL | INTRAVENOUS | Status: AC | PRN
  Administered 2017-05-04: 2 mL via INTRAVENOUS
  Filled 2017-05-04: qty 10

## 2017-05-04 MED ORDER — DM-GUAIFENESIN ER 30-600 MG PO TB12
1.0000 | ORAL_TABLET | Freq: Two times a day (BID) | ORAL | Status: DC
  Administered 2017-05-04 – 2017-05-06 (×5): 1 via ORAL
  Filled 2017-05-04 (×5): qty 1

## 2017-05-04 MED ORDER — IPRATROPIUM-ALBUTEROL 0.5-2.5 (3) MG/3ML IN SOLN
3.0000 mL | Freq: Three times a day (TID) | RESPIRATORY_TRACT | Status: DC
  Administered 2017-05-04 – 2017-05-05 (×2): 3 mL via RESPIRATORY_TRACT
  Filled 2017-05-04 (×2): qty 3

## 2017-05-04 MED ORDER — AZITHROMYCIN 250 MG PO TABS
500.0000 mg | ORAL_TABLET | Freq: Every day | ORAL | Status: DC
  Administered 2017-05-04 – 2017-05-06 (×3): 500 mg via ORAL
  Filled 2017-05-04 (×3): qty 2

## 2017-05-04 NOTE — Progress Notes (Signed)
  Echocardiogram 2D Echocardiogram has been performed.  Gary [name] 05/04/2017, 3:11 PM

## 2017-05-04 NOTE — Consult Note (Addendum)
Cardiology Consultation:   Patient ID: KAIGE WHISTLER; [id number]; [birth year]   Admit date: 05/03/2017 Date of Consult: 05/04/2017  Primary Care Provider: [name], MD Primary Cardiologist: [name], MD   Patient Profile:   Gary [name] is a 72 y.o. 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 Horris Latino.  History of Present Illness:   Mr. Orsino presented to the [facility] at [facility] with worsening dyspnea over the last 6 days, worse with exertion and coughing, reported 6 pound weight gain, orthopnea and PND.  The patient also noted an elevated temperature up to 100 F for the prior 48 hours.  He was referred for hospitalization and admitted at [facility] on 05/03/2017.    He was febrile on admission with temperature 101.8 F and BP was elevated at 180/84.  He tested negative for flu.  His BNP was measured at 350.9.  He had mildly elevated troponins and flat pattern 0.07, 0.08.  Chest x-ray showed congestive changes with cardiac enlargement, pulmonary vascular congestion and interstitial edema, no focal consolidation.  EKG showed atrial fibrillation at 56 bpm with borderline repolarization abnormality.   Upon my assessment he tells me that he started with a cough last week. He was using bottled water in his CPAP instead of distilled and wondered if he was developing a respiratory infection related to that. His cough got progressively worse and bothersome. On Monday it was much worse with wheezing and fever and he was unable to sleep due to cough. Yesterday around 4am he felt really bad with shortness of breath and very frequent cough and weakness and decided to go the urgent care. He does not feel like he had dyspnea with  exertion, it was more related to coughing. He denies orthopnea, PND or edema. He has abdominal pain from coughing but no chest pain. He states that he does not feel any symptoms that he relates to his heart. Currently he is breathing better and cough is better but still present. He still feels a little wheezy.   He reports that his home BP's have been well controlled with SBP 110-120. He even got a new BP cuff and it reports similar readings. It is a wrist cuff.   He was last seen in the office on 04/12/2017 by Dr. Oval Linsey at which time he reported some shortness of breath with activity that he attributed to being overweight.  He had no orthopnea, PND or lower extremity edema and he denied any recurrent atrial fibrillation.  Blood pressure was well controlled.  His most recent echocardiogram on 04/02/2016 showed moderate concentric hypertrophy with mildly reduced LV systolic function, EF 09-32%, diffuse hypokinesis and grade 1 diastolic dysfunction.  This was stable compared to his previous echocardiogram in 11/2014.  He had a low risk Myoview in 11/2014.  He had a cardioversion in 12/2014  Past Medical History:  Diagnosis Date  . Aortic aneurysm ([place]) 02/20/2016   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  . Arthritis   . Atrial fibrillation ([place]) 11/13/2014  . Cancer ([place])    skin  . 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 Coronary artery disease   . Depression   . Essential hypertension 11/13/2014  .  Hyperlipidemia   . Hyperlipidemia 11/13/2014  . Hypertension   . Sleep apnea    CPAP 'Uses half the time"  last study 2009    Past Surgical History:  Procedure Laterality Date  . CARDIOVERSION N/A 12/06/2014   Procedure: CARDIOVERSION;  Surgeon: [name], MD;  Location: [facility];  Service: Cardiovascular;  Laterality: N/A;  . CARPAL TUNNEL RELEASE    . CORONARY ANGIOPLASTY     multiple stents  . CORONARY ARTERY BYPASS GRAFT  2006    . EYE SURGERY     right eye-  muscular repair  . TOTAL HIP ARTHROPLASTY  01/03/2012   Procedure: TOTAL HIP ARTHROPLASTY;  Surgeon: [name], MD;  Location: [facility];  Service: Orthopedics;  Laterality: Right;     Home Medications:  Prior to Admission medications   Medication Sig Start Date End Date Taking? Authorizing Provider  apixaban (ELIQUIS) 5 MG TABS tablet Take 1 tablet (5 mg total) by mouth 2 (two) times daily. 08/19/16  Yes [name], MD  B Complex Vitamins (B COMPLEX PO) Take 1 tablet by mouth daily.   Yes [provider]  carvedilol (COREG) 12.5 MG tablet Take 12.5 mg by mouth 2 (two) times daily with a meal.   Yes [provider]  Cholecalciferol (VITAMIN D3) 5000 UNITS TABS Take 5,000 Units by mouth daily.   Yes [provider]  Dextromethorphan-guaiFENesin (CORICIDIN HBP CONGESTION/COUGH) 10-200 MG CAPS Take 2 tablets by mouth as needed.   Yes [provider]  ibuprofen (ADVIL,MOTRIN) 200 MG tablet Take 200 mg by mouth every 6 (six) hours as needed.   Yes [provider]  levothyroxine (SYNTHROID, LEVOTHROID) 137 MCG tablet Take 137 mcg by mouth daily. Take 1 1/2 tablet only on wednesday 10/27/14  Yes [provider]  losartan (COZAAR) 25 MG tablet Take 25 mg by mouth daily.    Yes [provider]  Multiple Vitamin (MULTIVITAMIN WITH MINERALS) TABS tablet Take 1 tablet by mouth 2 (two) times daily.   Yes [provider]  OVER THE COUNTER MEDICATION Take 1 tablet by mouth 2 (two) times daily. Take BLOOD SUGAR SUPPLEMENT    Yes [provider]  OVER THE COUNTER MEDICATION Take 1 tablet by mouth 2 (two) times daily.   Yes [provider]  sodium chloride (OCEAN) 0.65 % SOLN nasal spray Place 1 spray into both nostrils daily.   Yes [provider]  TURMERIC PO Take 500 mg by mouth 2 (two) times daily.    Yes [provider]  venlafaxine XR (EFFEXOR-XR) 150 MG 24  hr capsule Take 150 mg by mouth every morning.    Yes [provider]  Alirocumab (PRALUENT) 150 MG/ML SOPN Inject 1 pen into the skin every 14 (fourteen) days. Patient taking differently: Inject 1 pen into the skin every 14 (fourteen) days. On Thursday 03/09/16   [name], MD  testosterone cypionate (DEPOTESTOSTERONE CYPIONATE) 200 MG/ML injection Inject 200 mg into the muscle every 14 (fourteen) days. 10/09/14   [provider]    Inpatient Medications: Scheduled Meds: . apixaban  5 mg Oral BID  . azithromycin  500 mg Oral Daily  . carvedilol  12.5 mg Oral BID WC  . dextromethorphan-guaiFENesin  1 tablet Oral BID  . furosemide  40 mg Intravenous Q12H  . ipratropium-albuterol  3 mL Nebulization TID  . levothyroxine  137 mcg Oral Daily  . losartan  25 mg Oral Daily  . multivitamin with minerals  1 tablet  Oral BID  . nitroGLYCERIN  0.5 inch Topical Once  . venlafaxine XR  150 mg Oral QPM   Continuous Infusions:  PRN Meds: acetaminophen **OR** acetaminophen, guaiFENesin-dextromethorphan, ipratropium-albuterol, ondansetron **OR** ondansetron (ZOFRAN) IV  Allergies:   No Known Allergies  Social History:   Social History   Socioeconomic History  . Marital status: Married    Spouse name: Not on file  . Number of children: Not on file  . Years of education: Not on file  . Highest education level: Not on file  Occupational History  . Not on file  Social Needs  . Financial resource strain: Not on file  . Food insecurity:    Worry: Not on file    Inability: Not on file  . Transportation needs:    Medical: Not on file    Non-medical: Not on file  Tobacco Use  . Smoking status: Former Smoker    Types: Cigarettes    Last attempt to quit: 12/24/1983    Years since quitting: 33.3  . Smokeless tobacco: Never Used  Substance and Sexual Activity  . Alcohol use: No    Alcohol/week: 0.0 oz  . Drug use: No  . Sexual activity: Not on file  Lifestyle  .  Physical activity:    Days per week: Not on file    Minutes per session: Not on file  . Stress: Not on file  Relationships  . Social connections:    Talks on phone: Not on file    Gets together: Not on file    Attends religious service: Not on file    Active member of club or organization: Not on file    Attends meetings of clubs or organizations: Not on file    Relationship status: Not on file  . Intimate partner violence:    Fear of current or ex partner: Not on file    Emotionally abused: Not on file    Physically abused: Not on file    Forced sexual activity: Not on file  Other Topics Concern  . Not on file  Social History Narrative  . Not on file    Family History:    Family History  Problem Relation Age of Onset  . Heart attack Mother   . Other Father        odd stomach disorder  . Stroke Maternal Grandmother   . Heart attack Maternal Grandfather   . Heart attack Paternal Grandmother   . Heart attack Paternal Grandfather      ROS:  Please see the history of present illness.   All other ROS reviewed and negative.     Physical Exam/Data:   Vitals:   05/04/17 0400 05/04/17 0800 05/04/17 0839 05/04/17 1000  BP: (!) 178/122 (!) 146/104  (!) 165/74  Pulse: 99 84  84  Resp: (!) 22 (!) 22  18  Temp: 98.6 F (37 C) 97.9 F (36.6 C)    TempSrc: Oral Oral    SpO2: 95% 93% 97% 94%  Weight:      Height:        Intake/Output Summary (Last 24 hours) at 05/04/2017 1146 Last data filed at 05/04/2017 1000 Gross per 24 hour  Intake 540 ml  Output 2000 ml  Net -1460 ml   Filed Weights   05/03/17 0408 05/03/17 1800  Weight: 240 lb (108.9 kg) 241 lb 2.9 oz (109.4 kg)   Body mass index is 34.61 kg/m.  General:  Well nourished, well developed, in no acute distress  HEENT: normal Lymph: no adenopathy Neck: no JVD Endocrine:  No thryomegaly Vascular: No carotid bruits; FA pulses 2+ bilaterally without bruits  Cardiac:  normal S1, S2; irregularly irregular rhythm; no  murmur  Lungs:  clear to auscultation bilaterally except for scattered end inspiratory and end expiratory wheezes. Frequent NP cough Abd: soft, nontender, no hepatomegaly  Ext: no edema Musculoskeletal:  No deformities, BUE and BLE strength normal and equal Skin: warm and dry  Neuro:  CNs 2-12 intact, no focal abnormalities noted Psych:  Normal affect   EKG:  The EKG was personally reviewed and demonstrates:  atrial fibrillation at 56 bpm with borderline repolarization abnormality.  Telemetry:  Telemetry was personally reviewed and demonstrates:  Atrial fibrillation in the 80's.   Relevant CV Studies:  Echocardiogram 04/02/2016 Study Conclusions - Left ventricle: The cavity size was normal. There was moderate   concentric hypertrophy. Systolic function was mildly reduced. The   estimated ejection fraction was in the range of 45% to 50%.   Diffuse hypokinesis. Doppler parameters are consistent with   abnormal left ventricular relaxation (grade 1 diastolic   dysfunction). - Aortic valve: Trileaflet; mildly thickened, mildly calcified   leaflets. - Aorta: Ascending aortic diameter: 41 mm (S). - Ascending aorta: The ascending aorta was mildly dilated. - Mitral valve: Calcified annulus. There was mild regurgitation. - Left atrium: The atrium was moderately dilated. - Pulmonary arteries: Systolic pressure was mildly increased. PA   peak pressure: 39 mm Hg (S).  Impressions: - No significant change from prior echocardiogram.  Nuclear stress test 11/27/2014  There was no ST segment deviation noted during stress.  No T wave inversion was noted during stress.  Defect 1: There is a small defect of moderate severity.   Small size, moderate-intensity fixed inferolateral wall defect - could represent artifact or scar. The study was non-gated, therefore LVEF was not calculated and wall motion abnormalities could not be determined. Based on the lack of significant reversible ischemia, this  is a low risk study.  Laboratory Data:  Chemistry Recent Labs  Lab 05/03/17 0424 05/04/17 0324  NA 136 139  K 3.6 3.6  CL 104 104  CO2 23 25  GLUCOSE 166* 127*  BUN 17 13  CREATININE 0.70 0.80  CALCIUM 8.4* 8.8*  GFRNONAA >60 >60  GFRAA >60 >60  ANIONGAP 9 10    Recent Labs  Lab 05/03/17 0424 05/04/17 0324  PROT 6.9 7.5  ALBUMIN 3.5 3.5  AST 31 29  ALT 25 23  ALKPHOS 97 81  BILITOT 0.6 0.4   Hematology Recent Labs  Lab 05/03/17 0424  WBC 9.0  RBC 5.02  HGB 13.9  HCT 41.3  MCV 82.3  MCH 27.7  MCHC 33.7  RDW 16.1*  PLT 283   Cardiac Enzymes Recent Labs  Lab 05/03/17 0428 05/03/17 1022  TROPONINI 0.07* 0.08*   No results for input(s): TROPIPOC in the last 168 hours.  BNP Recent Labs  Lab 05/03/17 0420  BNP 350.9*    DDimer No results for input(s): DDIMER in the last 168 hours.  Radiology/Studies:  Dg Chest 2 View  Result Date: 05/03/2017 CLINICAL DATA:  Cold like symptoms for 5 days. Fever, shortness of breath, and chest congestion. EXAM: CHEST - 2 VIEW COMPARISON:  12/24/2011 FINDINGS: Postoperative changes in the mediastinum. Cardiac enlargement with prominent pulmonary vascular congestion and diffuse interstitial edema most prominent in the bases. No focal consolidation. No blunting of costophrenic angles. No pneumothorax. Degenerative changes in the spine and shoulders.  IMPRESSION: Congestive changes with cardiac enlargement, pulmonary vascular congestion, and interstitial edema. No focal consolidation. Electronically Signed   By: Lucienne Capers M.D.   On: 05/03/2017 05:53    Assessment and Plan:   Acute on chronic combined systolic and diastolic heart failure -Most recent echocardiogram in 03/2016 showed moderate concentric hypertrophy with mildly reduced LV systolic function, EF 93-23%, diffuse hypokinesis and grade 1 diastolic dysfunction.  This was stable compared to his previous echocardiogram in 11/2014.  -Home management includes  carvedilol 12.25 mg twice daily, losartan 25 mg daily.  Not routinely on a diuretic. -BNP 350.9.  Mildly elevated troponins and flat pattern 0.07, 0.08, consistent with CHF. -Weight is 241 pounds.  Office weight on 4/9 was 245 pounds and in 08/2016 was 245 pounds. -The patient has had fever, cough and wheezing, tested negative for flu.  It is likely that viral illness has triggered his heart failure decompensation, but also afib could be contributing.  -chest x-ray showed congestive changes with cardiac enlargement, pulmonary vascular congestion and interstitial edema, no focal consolidation.   -he does not appear signifiantly volume overloaded at present.  -Aggressive diuresis has been initiated by the primary team with Lasix 40 mg IV twice daily.  So far he is diuresing well with 2.4 L urine output in last 24 hours and a -2.8 L fluid balance since admission. -Beta-blocker and ARB are continued. -Echocardiogram ordered. -Continue with current diuretic therapy with monitoring of renal functions and electrolytes.  Paroxysmal atrial fibrillation -By recent office notes the patient has been maintaining sinus rhythm however he is currently in atrial fibrillation, rate controlled.  -Office notes indicate that patient has had profound fatigue when in atrial fibrillation in the past.  He was cardioverted in 12/2014 for this reason. -His being in A. fib is likely related to the viral respiratory illness and hopefully will resolve once he recovers. -Continue carvedilol and monitoring on telemetry -CHA2DS2/VAS Stroke Risk Score is 4 (CHF, vasc dz, HTN, age).  He is anticoagulated with Eliquis 5 mg twice daily. He has not missed any doses. We will follow up after recovery from this viral illness and if he continues in afib would consider arranging for cardioversion.    CAD S/P CABG and PCI  -Had low risk Myoview in 11/2014. -He is currently treated with carvedilol and losartan, no aspirin due to need for  anticoagulation.  On PCSK9 inhibitor. -No chest pain or exertional symptoms.   Hypertension -He continues on his home carvedilol 12.5 mg twice daily and losartan 25 mg daily along with IV diuresis. -Blood pressure is elevated, possibly related to his viral illness.  We will continue to monitor and consider increasing his losartan to 50 mg daily with normal renal function. -Elevated BP could be contributing to heart failure.  OSA -Pt reports compliance with CPAP.   Hyperlipidemia -Previous intolerance to atorvastatin, rosuvastatin, pravastatin.  Patient is on PCSK9 inhibitor therapy.  LDL very well controlled- 49 by labs in 07/2016.  LDL at goal of less than 70.  Carotid artery stenosis -Recent carotid ultrasound in 04/2017 showed stable mild disease   Aortic aneurysm -Ascending aorta was 4.0 cm 11/2013 and 03/2016. Stable.   For questions or updates, please contact [place] Please consult [email or web address] for contact info under Cardiology/STEMI.   Signed, Daune Perch, NP  05/04/2017 11:46 AM  Personally seen and examined. Agree with above.  72 year old with paroxysmal atrial fibrillation, has been in sinus rhythm for the past 2 years with return of atrial  fibrillation, fever, cough, dyspnea on exertion on chronic anticoagulation with Eliquis with ejection fraction 45 to 50% at his post CABG in 02/2004.  Currently is feeling better.  Still having some trouble with his breathing.  He also notes that he has had 3 loose stools a day for the past 3 weeks.  He also wonders if perhaps heavy activity in his garden over the past weekend tipped him over the edge so to speak.  GEN: Well nourished, well developed, in no acute distress  HEENT: normal  Neck: no JVD, carotid bruits, or masses Cardiac: irreg; no murmurs, rubs, or gallops,no lower extremity edema, CABG scar noted Respiratory: Mild end expiratory wheeze bilaterally, normal work of breathing GI: soft, nontender, nondistended, +  BS MS: no deformity or atrophy  Skin: warm and dry, no rash Neuro:  Alert and Oriented x 3, Strength and sensation are intact Psych: euthymic mood, full affect  Labs: Personally viewed ECG: Personally viewed-atrial fibrillation heart rate 56 bpm Telemetry: Personally viewed-currently atrial fibrillation heart rate in the 70s  Assessment and plan:  Acute on chronic diastolic/systolic heart failure - Ejection fraction mildly reduced.  Agree with IV Lasix.  Goal urine output between 1 to 2 L/day.  Still has mild wheezing and expiratory noted in his lung fields.  Heart failure alone should not cause elevated temperature unless there is been an inflammatory response associated with this.  Continue to treat for possible bronchitis as well, possible viral illness.  Paroxysmal atrial fibrillation - Interestingly, has been maintaining sinus rhythm over the past 2 years he states since last cardioversion.  Currently in atrial fibrillation with good rate control.  He just saw Dr. Oval Linsey and he was in normal rhythm then.  After he is over this current episode, if he still remains in atrial fibrillation as an outpatient, we can set him up for a cardioversion.  He has not missed any doses of Eliquis.  It is also possible that return of atrial fibrillation may have tipped him over to diastolic heart failure since he is losing his atrial kick.  If we are unable to make him feel better with diuresis alone, we may consider cardioversion here during this hospitalization.  Hopefully this will not need to be the case.  CAD -Post CABG, stable.  Candee Furbish, MD

## 2017-05-04 NOTE — Progress Notes (Addendum)
PROGRESS NOTE  ERRON [name] EZM:[id number] DOB: 11-30-1945 DOA: 05/03/2017 PCP: [name], MD  HPI/Recap of past 24 hours: MCIHAEL [name] is a 72 y.o. male with medical history significant for hypertension, coronary artery disease status post PCI and CABG, diastolic heart failure, dyslipidemia, paroxysmal atrial fibrillation and sleep apnea.  Patient has been developing worsening dyspnea for the last 6 days prior to hospitalization, moderate to severe in intensity, no improving factors, worse with exertion and coughing, reports about 6 pound weight gain, orthopnea and PND.  No lower extremity edema.  Over the last 48 hours he was noted to have elevated temperature up to 101.8 F.  Denies any sick contacts.  He was seen at the [facility] at [facility], and referred for further hospitalization. In the ED, CXR showed vascular congestion, admitted for further management  Today, pt still noted have dyspnea with persistent non-productive cough. Denies any L sided chest pain, but does report his sides hurting due to cough. No fever noted, denies any abdominal pain/N/V   Assessment/Plan: Active Problems:   Acute respiratory failure (HCC)   Pulmonary edema cardiac cause (HCC)   Respiratory failure (HCC)  Acute decompensated combined CHF BNP 350, CXR vascular congestion Likely triggered by ?acute bronchitis Troponin 0.07-->0.08, EKG no acute ST changes ECHO 03/2016: EF of 45 to 50%, diffuse hypokinesis Repeat echo pending Continue IV Lasix 40 mg twice daily Continue Coreg, losartan Strict I's and O's, daily weights Requesting cardiology consult, will see patient  ?Acute bronchitis Persistent non-productive cough Currently afebrile, with no leukocytosis  Influenza negative, ordered resp viral panel CXR personally reviewed, no obvious infiltrate seen Start PO Azithromycin for 5 days Cough meds, duonebs   Paroxysmal atrial fibrillation HR controlled, goes up with activity,  in afib Continue carvedilol, apixaban for [facility]  HTN Continue losartan, coreg  HLD LDL at goal On PCSK9 inhibitor therapy  Hypothyroidism Continue levothyroxine  OSA Advised to bring home CPAP  Depression Continue venlafaxine     Code Status: Full  Family Communication: Wife at bedside  Disposition Plan: Home once significant improvement   Consultants: Cardiology consulted  Procedures:  Known  Antimicrobials:  Azithromycin  DVT prophylaxis: Eliquis   Objective: Vitals:   05/04/17 0400 05/04/17 0800 05/04/17 0839 05/04/17 1000  BP: (!) 178/122 (!) 146/104  (!) 165/74  Pulse: 99 84  84  Resp: (!) 22 (!) 22  18  Temp: 98.6 F (37 C) 97.9 F (36.6 C)    TempSrc: Oral Oral    SpO2: 95% 93% 97% 94%  Weight:      Height:        Intake/Output Summary (Last 24 hours) at 05/04/2017 1254 Last data filed at 05/04/2017 1000 Gross per 24 hour  Intake 540 ml  Output 2000 ml  Net -1460 ml   Filed Weights   05/03/17 0408 05/03/17 1800  Weight: 108.9 kg (240 lb) 109.4 kg (241 lb 2.9 oz)    Exam:   General: Mild distress due to dyspnea  Cardiovascular: S1, S2 irregular  Respiratory: Diminished breath sounds bilaterally with very mild wheezing noted  Abdomen: Soft, obese, nontender, bowel sounds present  Musculoskeletal: No pedal edema bilaterally  Skin: Normal  Psychiatry: Normal mood   Data Reviewed: CBC: Recent Labs  Lab 05/03/17 0424  WBC 9.0  NEUTROABS 7.0  HGB 13.9  HCT 41.3  MCV 82.3  PLT 503   Basic Metabolic Panel: Recent Labs  Lab 05/03/17 0424 05/04/17 0324  NA 136 139  K 3.6 3.6  CL 104 104  CO2 23 25  GLUCOSE 166* 127*  BUN 17 13  CREATININE 0.70 0.80  CALCIUM 8.4* 8.8*  MG  --  1.8   GFR: Estimated Creatinine Clearance: 100.4 mL/min (by C-G formula based on SCr of 0.8 mg/dL). Liver Function Tests: Recent Labs  Lab 05/03/17 0424 05/04/17 0324  AST 31 29  ALT 25 23  ALKPHOS 97 81  BILITOT 0.6 0.4  PROT  6.9 7.5  ALBUMIN 3.5 3.5   No results for input(s): LIPASE, AMYLASE in the last 168 hours. No results for input(s): AMMONIA in the last 168 hours. Coagulation Profile: No results for input(s): INR, PROTIME in the last 168 hours. Cardiac Enzymes: Recent Labs  Lab 05/03/17 0428 05/03/17 1022  TROPONINI 0.07* 0.08*   BNP (last 3 results) No results for input(s): PROBNP in the last 8760 hours. HbA1C: No results for input(s): HGBA1C in the last 72 hours. CBG: No results for input(s): GLUCAP in the last 168 hours. Lipid Profile: No results for input(s): CHOL, HDL, LDLCALC, TRIG, CHOLHDL, LDLDIRECT in the last 72 hours. Thyroid Function Tests: No results for input(s): TSH, T4TOTAL, FREET4, T3FREE, THYROIDAB in the last 72 hours. Anemia Panel: No results for input(s): VITAMINB12, FOLATE, FERRITIN, TIBC, IRON, RETICCTPCT in the last 72 hours. Urine analysis:    Component Value Date/Time   COLORURINE YELLOW 05/03/2017 0610   APPEARANCEUR CLEAR 05/03/2017 0610   LABSPEC 1.020 05/03/2017 0610   PHURINE 6.0 05/03/2017 0610   GLUCOSEU NEGATIVE 05/03/2017 0610   HGBUR TRACE (A) 05/03/2017 0610   BILIRUBINUR NEGATIVE 05/03/2017 0610   KETONESUR NEGATIVE 05/03/2017 0610   PROTEINUR NEGATIVE 05/03/2017 0610   UROBILINOGEN 0.2 12/24/2011 0805   NITRITE NEGATIVE 05/03/2017 0610   LEUKOCYTESUR NEGATIVE 05/03/2017 0610   Sepsis Labs: @LABRCNTIP (procalcitonin:4,lacticidven:4)  ) Recent Results (from the past 240 hour(s))  MRSA PCR Screening     Status: None   Collection Time: 05/03/17  5:47 PM  Result Value Ref Range Status   MRSA by PCR NEGATIVE NEGATIVE Final    Comment:        The GeneXpert MRSA Assay (FDA approved for NASAL specimens only), is one component of a comprehensive MRSA colonization surveillance program. It is not intended to diagnose MRSA infection nor to guide or monitor treatment for MRSA infections. Performed at [facility], [place] [address] [place], [place] [address]       Studies: No results found.  Scheduled Meds: . apixaban  5 mg Oral BID  . azithromycin  500 mg Oral Daily  . carvedilol  12.5 mg Oral BID WC  . dextromethorphan-guaiFENesin  1 tablet Oral BID  . furosemide  40 mg Intravenous Q12H  . ipratropium-albuterol  3 mL Nebulization TID  . levothyroxine  137 mcg Oral Daily  . losartan  25 mg Oral Daily  . multivitamin with minerals  1 tablet Oral BID  . nitroGLYCERIN  0.5 inch Topical Once  . venlafaxine XR  150 mg Oral QPM    Continuous Infusions:   LOS: 1 day     Alma Friendly, MD [facility]   If 7PM-7AM, please contact night-coverage [email or web address] Password [email or web address] 05/04/2017, 12:54 PM

## 2017-05-04 NOTE — Progress Notes (Signed)
SBAR REPORT RECEIVED FROM Assunta Found, RN; CARE ASSUMED. PATEINT UP TO ROOM COMMODE; DENIES NEEDS. WIFE AT BEDSIDE.

## 2017-05-05 DIAGNOSIS — J9601 Acute respiratory failure with hypoxia: Secondary | ICD-10-CM

## 2017-05-05 LAB — CBC WITH DIFFERENTIAL/PLATELET
Basophils Absolute: 0.1 10*3/uL (ref 0.0–0.1)
Basophils Relative: 2 %
EOS ABS: 0.2 10*3/uL (ref 0.0–0.7)
Eosinophils Relative: 3 %
HEMATOCRIT: 47.2 % (ref 39.0–52.0)
HEMOGLOBIN: 15.5 g/dL (ref 13.0–17.0)
LYMPHS PCT: 32 %
Lymphs Abs: 2.3 10*3/uL (ref 0.7–4.0)
MCH: 27.1 pg (ref 26.0–34.0)
MCHC: 32.8 g/dL (ref 30.0–36.0)
MCV: 82.5 fL (ref 78.0–100.0)
Monocytes Absolute: 1.2 10*3/uL — ABNORMAL HIGH (ref 0.1–1.0)
Monocytes Relative: 16 %
Neutro Abs: 3.5 10*3/uL (ref 1.7–7.7)
Neutrophils Relative %: 47 %
Platelets: 323 10*3/uL (ref 150–400)
RBC: 5.72 MIL/uL (ref 4.22–5.81)
RDW: 15.5 % (ref 11.5–15.5)
WBC: 7.3 10*3/uL (ref 4.0–10.5)

## 2017-05-05 LAB — BASIC METABOLIC PANEL
Anion gap: 12 (ref 5–15)
BUN: 19 mg/dL (ref 6–20)
CHLORIDE: 101 mmol/L (ref 101–111)
CO2: 26 mmol/L (ref 22–32)
CREATININE: 0.81 mg/dL (ref 0.61–1.24)
Calcium: 9 mg/dL (ref 8.9–10.3)
GFR calc Af Amer: >60 mL/min (ref >60)
GFR calc non Af Amer: >60 mL/min (ref >60)
Glucose, Bld: 139 mg/dL — ABNORMAL HIGH (ref 65–99)
Potassium: 3.5 mmol/L (ref 3.5–5.1)
SODIUM: 139 mmol/L (ref 135–145)

## 2017-05-05 MED ORDER — FUROSEMIDE 10 MG/ML IJ SOLN
60.0000 mg | Freq: Two times a day (BID) | INTRAMUSCULAR | Status: DC
  Administered 2017-05-05 – 2017-05-06 (×2): 60 mg via INTRAVENOUS
  Filled 2017-05-05 (×2): qty 6

## 2017-05-05 NOTE — Progress Notes (Signed)
PROGRESS NOTE  Gary [name] BOF:[id number] DOB: May 08, 1945 DOA: 05/03/2017 PCP: [name], MD  HPI/Recap of past 24 hours: Gary [name] is a 72 y.o. male with medical history significant for hypertension, coronary artery disease status post PCI and CABG, diastolic heart failure, dyslipidemia, paroxysmal atrial fibrillation and sleep apnea.  Patient has been developing worsening dyspnea for the last 6 days prior to hospitalization, moderate to severe in intensity, no improving factors, worse with exertion and coughing, reports about 6 pound weight gain, orthopnea and PND.  No lower extremity edema.  Over the last 48 hours he was noted to have elevated temperature up to 101.8 F.  Denies any sick contacts.  He was seen at the [facility] at [facility], and referred for further hospitalization. In the ED, CXR showed vascular congestion, admitted for further management  Today, pt reported feeling better, cough with some improvement. Denies any L sided chest pain, worsening SOB, fever/chills.   Assessment/Plan: Active Problems:   Acute respiratory failure (HCC)   Pulmonary edema cardiac cause (HCC)   Respiratory failure (HCC)  Acute decompensated combined CHF BNP 350, CXR vascular congestion Likely triggered by ?acute bronchitis Troponin 0.07-->0.08, EKG no acute ST changes ECHO: EF of 45 to 50%, diffuse hypokinesis, unchanged from previous in 3/18 Continue IV Lasix 40 mg twice daily Continue Coreg, losartan Strict I's and O's, daily weights Cardiology on board, appreciate rces  Acute viral bronchitis Improving non-productive cough Currently afebrile, with no leukocytosis  Resp viral panel positive for metapneumovirus Influenza negative CXR personally reviewed, no obvious infiltrate seen Continue PO Azithromycin for 5 days Cough meds, duonebs   Paroxysmal atrial fibrillation HR controlled, goes up with activity, in afib Continue carvedilol, apixaban for  [facility]  HTN Continue losartan, coreg  HLD LDL at goal On PCSK9 inhibitor therapy  Hypothyroidism Continue levothyroxine  OSA Compliant with home CPAP  Depression Continue venlafaxine     Code Status: Full  Family Communication: None at bedside  Disposition Plan: Home once significant improvement   Consultants: Cardiology  Procedures:  NOne  Antimicrobials:  Azithromycin  DVT prophylaxis: Eliquis   Objective: Vitals:   05/05/17 0800 05/05/17 0853 05/05/17 0858 05/05/17 1200  BP: 122/68   (!) 146/83  Pulse: 72   (!) 50  Resp: 19   18  Temp: 98.2 F (36.8 C)     TempSrc: Oral     SpO2: 95% 98% 98% 95%  Weight:      Height:        Intake/Output Summary (Last 24 hours) at 05/05/2017 1428 Last data filed at 05/05/2017 1300 Gross per 24 hour  Intake 740 ml  Output 200 ml  Net 540 ml   Filed Weights   05/03/17 0408 05/03/17 1800  Weight: 108.9 kg (240 lb) 109.4 kg (241 lb 2.9 oz)    Exam:   General: NAD  Cardiovascular: S1, S2 irregular  Respiratory: Diminished breath sounds bilaterally with very mild wheezing noted  Abdomen: Soft, obese, nontender, bowel sounds present  Musculoskeletal: No pedal edema bilaterally  Skin: Normal  Psychiatry: Normal mood   Data Reviewed: CBC: Recent Labs  Lab 05/03/17 0424 05/05/17 0317  WBC 9.0 7.3  NEUTROABS 7.0 3.5  HGB 13.9 15.5  HCT 41.3 47.2  MCV 82.3 82.5  PLT 283 778   Basic Metabolic Panel: Recent Labs  Lab 05/03/17 0424 05/04/17 0324 05/05/17 0317  NA 136 139 139  K 3.6 3.6 3.5  CL 104 104 101  CO2 23  25 26  GLUCOSE 166* 127* 139*  BUN 17 13 19   CREATININE 0.70 0.80 0.81  CALCIUM 8.4* 8.8* 9.0  MG  --  1.8  --    GFR: Estimated Creatinine Clearance: 99.1 mL/min (by C-G formula based on SCr of 0.81 mg/dL). Liver Function Tests: Recent Labs  Lab 05/03/17 0424 05/04/17 0324  AST 31 29  ALT 25 23  ALKPHOS 97 81  BILITOT 0.6 0.4  PROT 6.9 7.5  ALBUMIN 3.5 3.5   No  results for input(s): LIPASE, AMYLASE in the last 168 hours. No results for input(s): AMMONIA in the last 168 hours. Coagulation Profile: No results for input(s): INR, PROTIME in the last 168 hours. Cardiac Enzymes: Recent Labs  Lab 05/03/17 0428 05/03/17 1022  TROPONINI 0.07* 0.08*   BNP (last 3 results) No results for input(s): PROBNP in the last 8760 hours. HbA1C: No results for input(s): HGBA1C in the last 72 hours. CBG: No results for input(s): GLUCAP in the last 168 hours. Lipid Profile: No results for input(s): CHOL, HDL, LDLCALC, TRIG, CHOLHDL, LDLDIRECT in the last 72 hours. Thyroid Function Tests: No results for input(s): TSH, T4TOTAL, FREET4, T3FREE, THYROIDAB in the last 72 hours. Anemia Panel: No results for input(s): VITAMINB12, FOLATE, FERRITIN, TIBC, IRON, RETICCTPCT in the last 72 hours. Urine analysis:    Component Value Date/Time   COLORURINE YELLOW 05/03/2017 0610   APPEARANCEUR CLEAR 05/03/2017 0610   LABSPEC 1.020 05/03/2017 0610   PHURINE 6.0 05/03/2017 0610   GLUCOSEU NEGATIVE 05/03/2017 0610   HGBUR TRACE (A) 05/03/2017 0610   BILIRUBINUR NEGATIVE 05/03/2017 0610   KETONESUR NEGATIVE 05/03/2017 0610   PROTEINUR NEGATIVE 05/03/2017 0610   UROBILINOGEN 0.2 12/24/2011 0805   NITRITE NEGATIVE 05/03/2017 0610   LEUKOCYTESUR NEGATIVE 05/03/2017 0610   Sepsis Labs: @LABRCNTIP (procalcitonin:4,lacticidven:4)  ) Recent Results (from the past 240 hour(s))  MRSA PCR Screening     Status: None   Collection Time: 05/03/17  5:47 PM  Result Value Ref Range Status   MRSA by PCR NEGATIVE NEGATIVE Final    Comment:        The GeneXpert MRSA Assay (FDA approved for NASAL specimens only), is one component of a comprehensive MRSA colonization surveillance program. It is not intended to diagnose MRSA infection nor to guide or monitor treatment for MRSA infections. Performed at [facility], [place] [address], [place], [place] [address]    Respiratory Panel by PCR     Status: Abnormal   Collection Time: 05/04/17  1:09 PM  Result Value Ref Range Status   Adenovirus NOT DETECTED NOT DETECTED Final   Coronavirus 229E NOT DETECTED NOT DETECTED Final   Coronavirus HKU1 NOT DETECTED NOT DETECTED Final   Coronavirus NL63 NOT DETECTED NOT DETECTED Final   Coronavirus OC43 NOT DETECTED NOT DETECTED Final   Metapneumovirus DETECTED (A) NOT DETECTED Final   Rhinovirus / Enterovirus NOT DETECTED NOT DETECTED Final   Influenza A NOT DETECTED NOT DETECTED Final   Influenza B NOT DETECTED NOT DETECTED Final   Parainfluenza Virus 1 NOT DETECTED NOT DETECTED Final   Parainfluenza Virus 2 NOT DETECTED NOT DETECTED Final   Parainfluenza Virus 3 NOT DETECTED NOT DETECTED Final   Parainfluenza Virus 4 NOT DETECTED NOT DETECTED Final   Respiratory Syncytial Virus NOT DETECTED NOT DETECTED Final   Bordetella pertussis NOT DETECTED NOT DETECTED Final   Chlamydophila pneumoniae NOT DETECTED NOT DETECTED Final   Mycoplasma pneumoniae NOT DETECTED NOT DETECTED Final  Studies: No results found.  Scheduled Meds: . apixaban  5 mg Oral BID  . azithromycin  500 mg Oral Daily  . carvedilol  12.5 mg Oral BID WC  . dextromethorphan-guaiFENesin  1 tablet Oral BID  . furosemide  60 mg Intravenous Q12H  . levothyroxine  137 mcg Oral Daily  . losartan  50 mg Oral Daily  . multivitamin with minerals  1 tablet Oral BID  . nitroGLYCERIN  0.5 inch Topical Once  . venlafaxine XR  150 mg Oral QPM    Continuous Infusions:   LOS: 2 days     Alma Friendly, MD [facility]   If 7PM-7AM, please contact night-coverage [email or web address] Password [email or web address] 05/05/2017, 2:28 PM

## 2017-05-05 NOTE — Progress Notes (Signed)
Transferred to room 1429 via wheelchair. Report given to RN.

## 2017-05-05 NOTE — Progress Notes (Addendum)
Progress Note  Patient Name: Gary [name] Date of Encounter: 05/05/2017  Primary Cardiologist: [name], MD   Subjective   Cough is better, but still present. He was able to sleep last night. No chest pain or shortness of breath.   Inpatient Medications    Scheduled Meds: . apixaban  5 mg Oral BID  . azithromycin  500 mg Oral Daily  . carvedilol  12.5 mg Oral BID WC  . dextromethorphan-guaiFENesin  1 tablet Oral BID  . furosemide  40 mg Intravenous Q12H  . ipratropium-albuterol  3 mL Nebulization TID  . levothyroxine  137 mcg Oral Daily  . losartan  50 mg Oral Daily  . multivitamin with minerals  1 tablet Oral BID  . nitroGLYCERIN  0.5 inch Topical Once  . venlafaxine XR  150 mg Oral QPM   Continuous Infusions:  PRN Meds: acetaminophen **OR** acetaminophen, guaiFENesin-dextromethorphan, ipratropium-albuterol, ondansetron **OR** ondansetron (ZOFRAN) IV   Vital Signs    Vitals:   05/05/17 0353 05/05/17 0600 05/05/17 0738 05/05/17 0800  BP:  (!) 166/99 (!) 166/99 122/68  Pulse:  68  72  Resp:  18  19  Temp: 97.9 F (36.6 C)     TempSrc: Oral     SpO2:  94%  95%  Weight:      Height:        Intake/Output Summary (Last 24 hours) at 05/05/2017 0841 Last data filed at 05/05/2017 0800 Gross per 24 hour  Intake 580 ml  Output 200 ml  Net 380 ml   Filed Weights   05/03/17 0408 05/03/17 1800  Weight: 240 lb (108.9 kg) 241 lb 2.9 oz (109.4 kg)    Telemetry    Atrial fibrillation in the 70's-80's - Personally Reviewed  ECG    No new tracings - Personally Reviewed  Physical Exam   GEN: No acute distress.   Neck: No JVD Cardiac: irregularly irregular rhythm, no murmurs, rubs, or gallops.  Respiratory: Scattered wheezes, crackle in the bases GI: Soft, nontender, non-distended  MS: No edema; No deformity. Neuro:  Nonfocal  Psych: Normal affect   Labs    Chemistry Recent Labs  Lab 05/03/17 0424 05/04/17 0324 05/05/17 0317  NA 136 139 139    K 3.6 3.6 3.5  CL 104 104 101  CO2 23 25 26   GLUCOSE 166* 127* 139*  BUN 17 13 19   CREATININE 0.70 0.80 0.81  CALCIUM 8.4* 8.8* 9.0  PROT 6.9 7.5  --   ALBUMIN 3.5 3.5  --   AST 31 29  --   ALT 25 23  --   ALKPHOS 97 81  --   BILITOT 0.6 0.4  --   GFRNONAA >60 >60 >60  GFRAA >60 >60 >60  ANIONGAP 9 10 12      Hematology Recent Labs  Lab 05/03/17 0424 05/05/17 0317  WBC 9.0 7.3  RBC 5.02 5.72  HGB 13.9 15.5  HCT 41.3 47.2  MCV 82.3 82.5  MCH 27.7 27.1  MCHC 33.7 32.8  RDW 16.1* 15.5  PLT 283 323    Cardiac Enzymes Recent Labs  Lab 05/03/17 0428 05/03/17 1022  TROPONINI 0.07* 0.08*   No results for input(s): TROPIPOC in the last 168 hours.   BNP Recent Labs  Lab 05/03/17 0420  BNP 350.9*     DDimer No results for input(s): DDIMER in the last 168 hours.   Radiology    No results found.  Cardiac Studies   Echocardiogram 05/04/17 Study Conclusions  -  Left ventricle: The cavity size was normal. There was moderate   concentric hypertrophy. Systolic function was mildly to   moderately reduced. The estimated ejection fraction was in the   range of 40% to 45%. Diffuse hypokinesis. - Aortic valve: Transvalvular velocity was within the normal range.   There was no stenosis. There was no regurgitation. - Mitral valve: Calcified annulus. Transvalvular velocity was   within the normal range. There was no evidence for stenosis.   There was no regurgitation. - Right ventricle: The cavity size was normal. Wall thickness was   normal. Systolic function was mildly reduced. - Atrial septum: No defect or patent foramen ovale was identified   by color flow Doppler. - Tricuspid valve: There was no regurgitation.  Echo 04/02/17: moderate concentric hypertrophy, EF 45-50%, grade 1 DD, moderately dilated LA.   Low risk Myoview 11/2014  Patient Profile     72 y.o. male with a hx of CAD s/p multiple PCI and CABG (2006), mild ascending aortic aneurysm, chronic  systolic and diastolic heart failure EF 45-50%, dyslipidemia, paroxysmal atrial fibrillation on Eliquis and OSA on CPAP who is being seen today for the evaluation of CHFand afib in the setting of possible respiratory illness.with fever. BNP 350.  Assessment & Plan    Acute on chronic combined systolic and diastolic CHF -In setting of possible bronchitis.  -Echo done yesterday is unchanged from previous Echo in 03/2016, EF 40-45% with moderate concentric hypertrophy.  -He is continued on carvedilol and losartan. Has not needed diuretic as an outpatient in the past.  -Diuresing with lasix 40 mg IV BID. Has had only 600 ml documented but pt says there was more not documented, although the pt feels that his urine output has not responded as well as he expected. 2.4L prior day. Wt is unchanged.  -Lungs with crackles more prominent in left base, not sure hos much is fluid and how much is respiratory illness.  -Renal function stable with SCr 0.81. Will increase lasix to 60 mg IV BID  Paroxysmal atrial fibrillation  -Hx of poorly tolerated afib in 2016, was cardioverted and has been maintaining SR on carvedilol until this episode.  -Currently continues in afib, rate controlled in the 70's -May have be stimulated by his respiratory illness.  -would not increase carvedilol at this time with his current wheezing. - After he is over this current episode, if he still remains in atrial fibrillation as an outpatient, we can set him up for a cardioversion. He has not missed any doses of Eliquis.  It is also possible that return of atrial fibrillation may have tipped him over to diastolic heart failure since he is losing his atrial kick.  If we are unable to make him feel better with diuresis alone, we may consider cardioversion here during this hospitalization.  Hopefully this will not need to be the case.  Hypertension -blood pressure has been elevated. Losartan increased from 25 to 50 mg. First dose today.  Monitor response  Possible Acute bronchitis -Being managed by primary team -Negative for flu but positive for Metpneumovirus -PO azithromycin was started. Nebs.  -Cough is improving. Still wheezing.  CAD -No chest discomfort  OSA -continue CPAP   For questions or updates, please contact [place] Please consult [email or web address] for contact info under Cardiology/STEMI.      Signed, Daune Perch, NP  05/05/2017, 8:41 AM    Personally seen and examined. Agree with above.  Mild improvement. Treating bronchitis and CHF.  Continue with  IV lasix. Ensure accurate I:0 Irreg irreg. PAF Agree with increased losartan to 50. HTN  Candee Furbish, MD

## 2017-05-05 NOTE — Plan of Care (Signed)

## 2017-05-05 NOTE — Progress Notes (Signed)
Patient arrived to 4W29 from ICU at this time. VSS. Safety precautions and orders reviewed with pt/family. TELE applied and confirmed. Will continue to monitor.  Samantha Ragen. RN

## 2017-05-06 ENCOUNTER — Telehealth: Payer: Self-pay | Admitting: Cardiovascular Disease

## 2017-05-06 DIAGNOSIS — J208 Acute bronchitis due to other specified organisms: Secondary | ICD-10-CM

## 2017-05-06 DIAGNOSIS — J209 Acute bronchitis, unspecified: Secondary | ICD-10-CM

## 2017-05-06 DIAGNOSIS — I5033 Acute on chronic diastolic (congestive) heart failure: Secondary | ICD-10-CM

## 2017-05-06 DIAGNOSIS — I509 Heart failure, unspecified: Secondary | ICD-10-CM

## 2017-05-06 DIAGNOSIS — I501 Left ventricular failure: Secondary | ICD-10-CM

## 2017-05-06 LAB — BASIC METABOLIC PANEL
ANION GAP: 12 (ref 5–15)
BUN: 23 mg/dL — ABNORMAL HIGH (ref 6–20)
CALCIUM: 9 mg/dL (ref 8.9–10.3)
CO2: 27 mmol/L (ref 22–32)
Chloride: 102 mmol/L (ref 101–111)
Creatinine, Ser: 0.79 mg/dL (ref 0.61–1.24)
GFR calc Af Amer: >60 mL/min (ref >60)
GLUCOSE: 119 mg/dL — AB (ref 65–99)
POTASSIUM: 3.3 mmol/L — AB (ref 3.5–5.1)
Sodium: 141 mmol/L (ref 135–145)

## 2017-05-06 LAB — CBC WITH DIFFERENTIAL/PLATELET
Basophils Absolute: 0.1 10*3/uL (ref 0.0–0.1)
Basophils Relative: 1 %
EOS ABS: 0.2 10*3/uL (ref 0.0–0.7)
Eosinophils Relative: 2 %
HCT: 46.6 % (ref 39.0–52.0)
Hemoglobin: 15.4 g/dL (ref 13.0–17.0)
LYMPHS ABS: 2.3 10*3/uL (ref 0.7–4.0)
Lymphocytes Relative: 24 %
MCH: 27.3 pg (ref 26.0–34.0)
MCHC: 33 g/dL (ref 30.0–36.0)
MCV: 82.6 fL (ref 78.0–100.0)
Monocytes Absolute: 1.3 10*3/uL (ref 0.1–1.0)
Monocytes Relative: 13 %
NEUTROS PCT: 60 %
Neutro Abs: 5.8 10*3/uL (ref 1.7–7.7)
PLATELETS: 361 10*3/uL (ref 150–400)
RBC: 5.64 MIL/uL (ref 4.22–5.81)
RDW: 15.3 % (ref 11.5–15.5)
WBC: 9.6 10*3/uL (ref 4.0–10.5)

## 2017-05-06 MED ORDER — FUROSEMIDE 20 MG PO TABS: 20.0000 mg | ORAL_TABLET | Freq: Every day | ORAL | Status: DC

## 2017-05-06 MED ORDER — AZITHROMYCIN 500 MG PO TABS: 500.0000 mg | ORAL_TABLET | Freq: Every day | ORAL | 0 refills | Status: AC

## 2017-05-06 MED ORDER — AZITHROMYCIN 500 MG PO TABS: 500.0000 mg | ORAL_TABLET | Freq: Every day | ORAL | 0 refills | Status: DC

## 2017-05-06 MED ORDER — FUROSEMIDE 20 MG PO TABS: 20.0000 mg | ORAL_TABLET | Freq: Every day | ORAL | 0 refills | Status: DC

## 2017-05-06 MED ORDER — LOSARTAN POTASSIUM 50 MG PO TABS: 50.0000 mg | ORAL_TABLET | Freq: Every day | ORAL | 0 refills | Status: DC

## 2017-05-06 MED ORDER — POTASSIUM CHLORIDE CRYS ER 20 MEQ PO TBCR
40.0000 meq | EXTENDED_RELEASE_TABLET | Freq: Once | ORAL | Status: AC
  Administered 2017-05-06: 40 meq via ORAL
  Filled 2017-05-06: qty 2

## 2017-05-06 MED ORDER — ALBUTEROL SULFATE HFA 108 (90 BASE) MCG/ACT IN AERS: 2.0000 | INHALATION_SPRAY | Freq: Four times a day (QID) | RESPIRATORY_TRACT | 0 refills | Status: DC | PRN

## 2017-05-06 NOTE — Telephone Encounter (Signed)
New Message   Patient is calling in reference to scheduling a cardioversion. Please call to discuss.

## 2017-05-06 NOTE — Progress Notes (Addendum)
Progress Note  Patient Name: Gary [name] Date of Encounter: 05/06/2017  Primary Cardiologist: [name], MD   Subjective   Pt up in room, he is feeling better. He still has a cough and feels weak but overall feels like he can go home.   Inpatient Medications    Scheduled Meds: . apixaban  5 mg Oral BID  . azithromycin  500 mg Oral Daily  . carvedilol  12.5 mg Oral BID WC  . dextromethorphan-guaiFENesin  1 tablet Oral BID  . furosemide  60 mg Intravenous Q12H  . levothyroxine  137 mcg Oral Daily  . losartan  50 mg Oral Daily  . multivitamin with minerals  1 tablet Oral BID  . nitroGLYCERIN  0.5 inch Topical Once  . potassium chloride  40 mEq Oral Once  . venlafaxine XR  150 mg Oral QPM   Continuous Infusions:  PRN Meds: acetaminophen **OR** acetaminophen, guaiFENesin-dextromethorphan, ipratropium-albuterol, ondansetron **OR** ondansetron (ZOFRAN) IV   Vital Signs    Vitals:   05/05/17 1200 05/05/17 1552 05/05/17 2051 05/06/17 0414  BP: (!) 146/83 139/77 119/76 135/74  Pulse: (!) 50 70 73 72  Resp: 18 18 20 18   Temp: 98.1 F (36.7 C) 97.8 F (36.6 C) 97.9 F (36.6 C) 98.2 F (36.8 C)  TempSrc: Oral Oral Oral Oral  SpO2: 95% 96% 94% 96%  Weight:  239 lb 6.4 oz (108.6 kg)    Height:  5\' 10"  (1.778 m)      Intake/Output Summary (Last 24 hours) at 05/06/2017 0820 Last data filed at 05/05/2017 2238 Gross per 24 hour  Intake 1120 ml  Output 700 ml  Net 420 ml   Filed Weights   05/03/17 0408 05/03/17 1800 05/05/17 1552  Weight: 240 lb (108.9 kg) 241 lb 2.9 oz (109.4 kg) 239 lb 6.4 oz (108.6 kg)    Telemetry    AF, VR 70-95, PVCs - Personally Reviewed  ECG    Non new - Personally Reviewed  Physical Exam   GEN: Overweight male, No acute distress.   Neck: No JVD Cardiac: irregularly iregular Respiratory: faint wheezing throughout GI: Soft, nontender, non-distended  MS: No edema; No deformity. Neuro:  Nonfocal  Psych: Normal affect   Labs      Chemistry Recent Labs  Lab 05/03/17 0424 05/04/17 0324 05/05/17 0317 05/06/17 0413  NA 136 139 139 141  K 3.6 3.6 3.5 3.3*  CL 104 104 101 102  CO2 23 25 26 27   GLUCOSE 166* 127* 139* 119*  BUN 17 13 19  23*  CREATININE 0.70 0.80 0.81 0.79  CALCIUM 8.4* 8.8* 9.0 9.0  PROT 6.9 7.5  --   --   ALBUMIN 3.5 3.5  --   --   AST 31 29  --   --   ALT 25 23  --   --   ALKPHOS 97 81  --   --   BILITOT 0.6 0.4  --   --   GFRNONAA >60 >60 >60 >60  GFRAA >60 >60 >60 >60  ANIONGAP 9 10 12 12      Hematology Recent Labs  Lab 05/03/17 0424 05/05/17 0317  WBC 9.0 7.3  RBC 5.02 5.72  HGB 13.9 15.5  HCT 41.3 47.2  MCV 82.3 82.5  MCH 27.7 27.1  MCHC 33.7 32.8  RDW 16.1* 15.5  PLT 283 323    Cardiac Enzymes Recent Labs  Lab 05/03/17 0428 05/03/17 1022  TROPONINI 0.07* 0.08*   No results for input(s):  TROPIPOC in the last 168 hours.   BNP Recent Labs  Lab 05/03/17 0420  BNP 350.9*     DDimer No results for input(s): DDIMER in the last 168 hours.   Radiology    No results found.  Cardiac Studies   Echo 05/04/17- Study Conclusions  - Left ventricle: The cavity size was normal. There was moderate   concentric hypertrophy. Systolic function was mildly to   moderately reduced. The estimated ejection fraction was in the   range of 40% to 45%. Diffuse hypokinesis. - Aortic valve: Transvalvular velocity was within the normal range.   There was no stenosis. There was no regurgitation. - Mitral valve: Calcified annulus. Transvalvular velocity was   within the normal range. There was no evidence for stenosis.   There was no regurgitation. - Right ventricle: The cavity size was normal. Wall thickness was   normal. Systolic function was mildly reduced. - Atrial septum: No defect or patent foramen ovale was identified   by color flow Doppler. - Tricuspid valve: There was no regurgitation.  Patient Profile     72 y.o. male with a history of CAD and PAF, admitted  05/03/17 with recurrent AF, CHF, in setting of viral URI.   Assessment & Plan    Acute CHF- In the setting of recurrent atrial fibrillation and viral URI. He has improved symptomatically with 2L diuresis  Cardiomyopathy EF 40-45% in AF (45-50% in NSR)  Viral URI- Metapneumovirus detected  CAD- CABG '06. Slight bump in Troponin- suspect this was demand ischemia from AF  PAF-  He had been in NSR for the past couple of years till this episode  Chronic anticoagulation- He has not missed Eliquis  Plan: He looks ready to go. I would probably discharge him on low dose Lasix- 20 mg daily (on no diuretic prior to admission). We will arrange for OP follow with Dr Oval Linsey in 1-2 weeks then consider OP DCCV if he has recovered completely from his URI. MD to review above recommendations.     For questions or updates, please contact [place] Please consult [email or web address] for contact info under Cardiology/STEMI.      Signed, Kerin Ransom, PA-C  05/06/2017, 8:20 AM    Personally seen and examined. Agree with above.  Agree with diuretic at home, Lasix 20 mg a day is reasonable.  Daily weights. Feels much better.  No chest pain, decreased shortness of breath. Mild wheezes still heard on exam but improved EF 40 to 45% when atrial fibrillation, slightly better in normal sinus rhythm 45-50. I think would be reasonable to perform DC cardioversion in 3 weeks if he still remains in atrial fibrillation.  Quite well for the past 2 years in sinus rhythm.  His current upper respiratory virus likely kicked in his atrial fibrillation.  Prior CABG CAD is stable.  Very low subtle increase in troponin, early demand ischemia in the setting of his underlying medical illness.  Continue with Eliquis.  Okay with discharge from my perspective.  Consider inhaler on discharge  Candee Furbish, MD

## 2017-05-06 NOTE — Discharge Summary (Signed)
Discharge Summary  Gary [name]:[id number] DOB: 06-Nov-1945  PCP: [name], MD  Admit date: 05/03/2017 Discharge date: 05/06/2017  Time spent: 35 mins  Recommendations for Outpatient Follow-up:  1. PCP 2. Cardiology  Discharge Diagnoses:  Active Hospital Problems   Diagnosis Date Noted  . Acute respiratory failure (Gary [name]) 05/03/2017  . Pulmonary edema cardiac cause (Gary [name]) 05/03/2017  . Respiratory failure ([place]) 05/03/2017    Resolved Hospital Problems  No resolved problems to display.    Discharge Condition: Stable  Diet recommendation: Heart healthy   Vitals:   05/05/17 2051 05/06/17 0414  BP: 119/76 135/74  Pulse: 73 72  Resp: 20 18  Temp: 97.9 F (36.6 C) 98.2 F (36.8 C)  SpO2: 94% 96%    History of present illness:  Gary [name] a 72 y.o.malewith medical history significantfor hypertension, coronary artery disease status post PCI and CABG,diastolicheart failure, dyslipidemia, paroxysmal atrial fibrillationand sleep apnea.Patient has been developing worsening dyspnea for the last 6 days prior to hospitalization,moderate to severe in intensity,no improving factors, worse with exertion and coughing,reports about 6 pound weight gain,orthopnea and PND.No lower extremity edema.Over the last 48 hours he was noted to have elevated temperature up to 101.8 F.Denies any sick contacts.He was [facility] at the [facility], and referred for further hospitalization. In the ED, CXR showed vascular congestion, admitted for further management  Today, pt reported feeling better overall, improvement in cough. Denies any L sided chest pain, worsening SOB, fever/chills. Pt stable for discharge to follow up with PCP and cardiology   Hospital Course:  Active Problems:   Acute respiratory failure (HCC)   Pulmonary edema cardiac cause (HCC)   Respiratory failure (HCC)  Acute decompensated combined CHF BNP 350, CXR vascular  congestion Likely triggered by ?acute viral bronchitis Troponin 0.07-->0.08, EKG no acute ST changes ECHO: EF of 45 to 50%, diffuse hypokinesis, unchanged from previous in 3/18 Pt is -1.87L after diuresis with IV Lasix Continue with PO Lasix 20 mg daily Continue Coreg, losartan Follow up with cardiology  Acute viral bronchitis Improving non-productive cough Currently afebrile, with no leukocytosis  Resp viral panel positive for metapneumovirus Influenza negative CXR personally reviewed, no obvious infiltrate seen Continue PO Azithromycin for 5 days, last dose on 05/08/17 Cough meds, started on prn inhaler  Paroxysmal atrial fibrillation HR controlled, still in afib Continue carvedilol, apixaban for [facility] will follow up in 1-2 weeks and plan for outpt cardioversion if pt hasn't converted to NSR  HTN Increased losartan to 50 mg daily, continue coreg PCP to follow up  HLD LDL at goal On PCSK9 inhibitor therapy  Hypothyroidism Continue levothyroxine  OSA Compliant with home CPAP  Depression Continue venlafaxine     Procedures:  None   Consultations:  Cardiology  Discharge Exam: BP 135/74 (BP Location: Left Arm)   Pulse 72   Temp 98.2 F (36.8 C) (Oral)   Resp 18   Ht 5\' 10"  (1.778 m)   Wt 108.6 kg (239 lb 6.4 oz)   SpO2 96%   BMI 34.35 kg/m   General: NAD  Cardiovascular: S1, S2 present, irregular Respiratory: Diminished BS b/l with very mild wheezing noted   Discharge Instructions You were cared for by a hospitalist during your hospital stay. If you have any questions about your discharge medications or the care you received while you were in the hospital after you are discharged, you can call the unit and asked to speak with the hospitalist on call if the  hospitalist that took care of you is not available. Once you are discharged, your primary care physician will handle any further medical issues. Please note that NO REFILLS for any  discharge medications will be authorized once you are discharged, as it is imperative that you return to your primary care physician (or establish a relationship with a primary care physician if you do not have one) for your aftercare needs so that they can reassess your need for medications and monitor your lab values.  Discharge Instructions    Diet - low sodium heart healthy   Complete by:  As directed    Increase activity slowly   Complete by:  As directed      Allergies as of 05/06/2017   No Known Allergies     Medication List    TAKE these medications   albuterol 108 (90 Base) MCG/ACT inhaler Commonly known as:  PROVENTIL HFA;VENTOLIN HFA Inhale 2 puffs into the lungs every 6 (six) hours as needed for wheezing or shortness of breath.   Alirocumab 150 MG/ML Sopn Commonly known as:  PRALUENT Inject 1 pen into the skin every 14 (fourteen) days. What changed:  additional instructions   apixaban 5 MG Tabs tablet Commonly known as:  ELIQUIS Take 1 tablet (5 mg total) by mouth 2 (two) times daily.   azithromycin 500 MG tablet Commonly known as:  ZITHROMAX Take 1 tablet (500 mg total) by mouth daily for 2 days. Start taking on:  05/07/2017   B COMPLEX PO Take 1 tablet by mouth daily.   carvedilol 12.5 MG tablet Commonly known as:  COREG Take 12.5 mg by mouth 2 (two) times daily with a meal.   CORICIDIN HBP CONGESTION/COUGH 10-200 MG Caps Generic drug:  Dextromethorphan-guaiFENesin Take 2 tablets by mouth as needed.   furosemide 20 MG tablet Commonly known as:  LASIX Take 1 tablet (20 mg total) by mouth daily. Start taking on:  05/07/2017   ibuprofen 200 MG tablet Commonly known as:  ADVIL,MOTRIN Take 200 mg by mouth every 6 (six) hours as needed.   levothyroxine 137 MCG tablet Commonly known as:  SYNTHROID, LEVOTHROID Take 137 mcg by mouth daily. Take 1 1/2 tablet only on wednesday   losartan 50 MG tablet Commonly known as:  COZAAR Take 1 tablet (50 mg total) by  mouth daily. What changed:    medication strength  how much to take   multivitamin with minerals Tabs tablet Take 1 tablet by mouth 2 (two) times daily.   OVER THE COUNTER MEDICATION Take 1 tablet by mouth 2 (two) times daily. Take BLOOD SUGAR SUPPLEMENT   OVER THE COUNTER MEDICATION Take 1 tablet by mouth 2 (two) times daily.   sodium chloride 0.65 % Soln nasal spray Commonly known as:  OCEAN Place 1 spray into both nostrils daily.   testosterone cypionate 200 MG/ML injection Commonly known as:  DEPOTESTOSTERONE CYPIONATE Inject 200 mg into the muscle every 14 (fourteen) days.   TURMERIC PO Take 500 mg by mouth 2 (two) times daily.   venlafaxine XR 150 MG 24 hr capsule Commonly known as:  EFFEXOR-XR Take 150 mg by mouth every morning.   Vitamin D3 5000 units Tabs Take 5,000 Units by mouth daily.      No Known Allergies Follow-up Information    [name], MD. Schedule an appointment as soon as possible for a visit in 1 week(s).   Specialty:  Internal Medicine Contact information: [address] [place] [address] [phone]  [name], MD .   Specialty:  Cardiology Contact information: [address] [place] [address] [phone]            The results of significant diagnostics from this hospitalization (including imaging, microbiology, ancillary and laboratory) are listed below for reference.    Significant Diagnostic Studies: Dg Chest 2 View  Result Date: 05/03/2017 CLINICAL DATA:  Cold like symptoms for 5 days. Fever, shortness of breath, and chest congestion. EXAM: CHEST - 2 VIEW COMPARISON:  12/24/2011 FINDINGS: Postoperative changes in the mediastinum. Cardiac enlargement with prominent pulmonary vascular congestion and diffuse interstitial edema most prominent in the bases. No focal consolidation. No blunting of costophrenic angles. No pneumothorax. Degenerative changes in the spine and  shoulders. IMPRESSION: Congestive changes with cardiac enlargement, pulmonary vascular congestion, and interstitial edema. No focal consolidation. Electronically Signed   By: Lucienne Capers M.D.   On: 05/03/2017 05:53    Microbiology: Recent Results (from the past 240 hour(s))  MRSA PCR Screening     Status: None   Collection Time: 05/03/17  5:47 PM  Result Value Ref Range Status   MRSA by PCR NEGATIVE NEGATIVE Final    Comment:        The GeneXpert MRSA Assay (FDA approved for NASAL specimens only), is one component of a comprehensive MRSA colonization surveillance program. It is not intended to diagnose MRSA infection nor to guide or monitor treatment for MRSA infections. Performed at [facility], [place] [address], [place],  [address]   Respiratory Panel by PCR     Status: Abnormal   Collection Time: 05/04/17  1:09 PM  Result Value Ref Range Status   Adenovirus NOT DETECTED NOT DETECTED Final   Coronavirus 229E NOT DETECTED NOT DETECTED Final   Coronavirus HKU1 NOT DETECTED NOT DETECTED Final   Coronavirus NL63 NOT DETECTED NOT DETECTED Final   Coronavirus OC43 NOT DETECTED NOT DETECTED Final   Metapneumovirus DETECTED (A) NOT DETECTED Final   Rhinovirus / Enterovirus NOT DETECTED NOT DETECTED Final   Influenza A NOT DETECTED NOT DETECTED Final   Influenza B NOT DETECTED NOT DETECTED Final   Parainfluenza Virus 1 NOT DETECTED NOT DETECTED Final   Parainfluenza Virus 2 NOT DETECTED NOT DETECTED Final   Parainfluenza Virus 3 NOT DETECTED NOT DETECTED Final   Parainfluenza Virus 4 NOT DETECTED NOT DETECTED Final   Respiratory Syncytial Virus NOT DETECTED NOT DETECTED Final   Bordetella pertussis NOT DETECTED NOT DETECTED Final   Chlamydophila pneumoniae NOT DETECTED NOT DETECTED Final   Mycoplasma pneumoniae NOT DETECTED NOT DETECTED Final     Labs: Basic Metabolic Panel: Recent Labs  Lab 05/03/17 0424 05/04/17 0324 05/05/17 0317  05/06/17 0413  NA 136 139 139 141  K 3.6 3.6 3.5 3.3*  CL 104 104 101 102  CO2 23 25 26 27   GLUCOSE 166* 127* 139* 119*  BUN 17 13 19  23*  CREATININE 0.70 0.80 0.81 0.79  CALCIUM 8.4* 8.8* 9.0 9.0  MG  --  1.8  --   --    Liver Function Tests: Recent Labs  Lab 05/03/17 0424 05/04/17 0324  AST 31 29  ALT 25 23  ALKPHOS 97 81  BILITOT 0.6 0.4  PROT 6.9 7.5  ALBUMIN 3.5 3.5   No results for input(s): LIPASE, AMYLASE in the last 168 hours. No results for input(s): AMMONIA in the last 168 hours. CBC: Recent Labs  Lab 05/03/17 0424 05/05/17 0317 05/06/17 0413  WBC 9.0 7.3  9.6  NEUTROABS 7.0 3.5 5.8  HGB 13.9 15.5 15.4  HCT 41.3 47.2 46.6  MCV 82.3 82.5 82.6  PLT 283 323 361   Cardiac Enzymes: Recent Labs  Lab 05/03/17 0428 05/03/17 1022  TROPONINI 0.07* 0.08*   BNP: BNP (last 3 results) Recent Labs    05/03/17 0420  BNP 350.9*    ProBNP (last 3 results) No results for input(s): PROBNP in the last 8760 hours.  CBG: No results for input(s): GLUCAP in the last 168 hours.     Signed:  Alma Friendly, MD [facility] 05/06/2017, 11:20 AM

## 2017-05-06 NOTE — Telephone Encounter (Signed)
Left message to call back  

## 2017-05-09 ENCOUNTER — Encounter: Payer: Self-pay | Admitting: Physician Assistant

## 2017-05-10 ENCOUNTER — Ambulatory Visit: Payer: Medicare Other

## 2017-05-10 NOTE — Telephone Encounter (Signed)
Spoke with Pt who informed he has an appointment on 5/16 with Rosaria Ferries, PA and they will discuss then the possibility of a cardioversion.

## 2017-05-16 DIAGNOSIS — M1990 Unspecified osteoarthritis, unspecified site: Secondary | ICD-10-CM | POA: Diagnosis not present

## 2017-05-16 DIAGNOSIS — Z1211 Encounter for screening for malignant neoplasm of colon: Secondary | ICD-10-CM | POA: Diagnosis not present

## 2017-05-16 DIAGNOSIS — E291 Testicular hypofunction: Secondary | ICD-10-CM | POA: Diagnosis not present

## 2017-05-16 DIAGNOSIS — I10 Essential (primary) hypertension: Secondary | ICD-10-CM | POA: Diagnosis not present

## 2017-05-16 DIAGNOSIS — E039 Hypothyroidism, unspecified: Secondary | ICD-10-CM | POA: Diagnosis not present

## 2017-05-16 DIAGNOSIS — G4733 Obstructive sleep apnea (adult) (pediatric): Secondary | ICD-10-CM | POA: Diagnosis not present

## 2017-05-16 DIAGNOSIS — Z0000 Encounter for general adult medical examination without abnormal findings: Secondary | ICD-10-CM | POA: Diagnosis not present

## 2017-05-16 DIAGNOSIS — F324 Major depressive disorder, single episode, in partial remission: Secondary | ICD-10-CM | POA: Diagnosis not present

## 2017-05-16 DIAGNOSIS — Z125 Encounter for screening for malignant neoplasm of prostate: Secondary | ICD-10-CM | POA: Diagnosis not present

## 2017-05-16 DIAGNOSIS — R7309 Other abnormal glucose: Secondary | ICD-10-CM | POA: Diagnosis not present

## 2017-05-16 DIAGNOSIS — Z1389 Encounter for screening for other disorder: Secondary | ICD-10-CM | POA: Diagnosis not present

## 2017-05-16 DIAGNOSIS — I25810 Atherosclerosis of coronary artery bypass graft(s) without angina pectoris: Secondary | ICD-10-CM | POA: Diagnosis not present

## 2017-05-16 DIAGNOSIS — E7800 Pure hypercholesterolemia, unspecified: Secondary | ICD-10-CM | POA: Diagnosis not present

## 2017-05-16 DIAGNOSIS — N400 Enlarged prostate without lower urinary tract symptoms: Secondary | ICD-10-CM | POA: Diagnosis not present

## 2017-05-16 DIAGNOSIS — I4891 Unspecified atrial fibrillation: Secondary | ICD-10-CM | POA: Diagnosis not present

## 2017-05-18 ENCOUNTER — Encounter: Payer: Self-pay | Admitting: Physician Assistant

## 2017-05-19 ENCOUNTER — Ambulatory Visit (INDEPENDENT_AMBULATORY_CARE_PROVIDER_SITE_OTHER): Payer: Medicare Other | Admitting: Physician Assistant

## 2017-05-19 ENCOUNTER — Encounter: Payer: Self-pay | Admitting: Physician Assistant

## 2017-05-19 VITALS — BP 124/74 | HR 73 | Ht 70.0 in | Wt 242.2 lb

## 2017-05-19 DIAGNOSIS — Z7901 Long term (current) use of anticoagulants: Secondary | ICD-10-CM

## 2017-05-19 DIAGNOSIS — I481 Persistent atrial fibrillation: Secondary | ICD-10-CM | POA: Diagnosis not present

## 2017-05-19 DIAGNOSIS — I10 Essential (primary) hypertension: Secondary | ICD-10-CM | POA: Diagnosis not present

## 2017-05-19 DIAGNOSIS — I4819 Other persistent atrial fibrillation: Secondary | ICD-10-CM

## 2017-05-19 DIAGNOSIS — I6523 Occlusion and stenosis of bilateral carotid arteries: Secondary | ICD-10-CM

## 2017-05-19 DIAGNOSIS — I5042 Chronic combined systolic (congestive) and diastolic (congestive) heart failure: Secondary | ICD-10-CM

## 2017-05-19 NOTE — H&P (Signed)
Cardiology History and Physical   Date:  05/19/2017   ID:  Gary [name], DOB [birth year], MRN [id number]  PCP:  [name], MD  Cardiologist: Dr. Oval Linsey, 08/30/2016 Rosaria Ferries, PA-C   Chief Complaint  Patient presents with  .  Atrial fibrillation    pt denies chest pains, pt does complain of SOB/weakness, denies swelling in hands/feet    History of Present Illness: Gary [name] is a 72 y.o. 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 fib and outpatient visit to discuss cardioversion planned, losartan increased for better blood pressure control, EF 40-45%  Gary [name] presents for cardiology follow up.  He saw Dr Deforest Hoyles last week, had complete blood work done.   He sometimes feels better, but cannot exert himself significantly. Had to hire someone to do yard work.   Feels his SOB is better, but still has significant fatigue. Feels the fatigue is from the atrial fib.   Still coughing but it is now a dry cough, was a wet cough till a few days ago. No LE edema, no orthopnea, no PND. Is not doing daily weights, but he is weighing regularly. He does not think his weight has changed much although he notices it will jump up 3-4 lbs temporarily.   He is interested in the DCCV, is sure he has not missed any doses of Eliquis since d/c and was on Eliquis during his stay.   His weight is stable within about a 4 lb range. He has become unable to eat some things, such as ham, because they taste very salty.  He is very conscious of not adding salt to foods and choosing foods that are naturally low in sodium.  He never gets chest pain. His angina was a stinging sensation, he has not had that since the CABG.   No bleeding issues on the Eliquis.   Past Medical  History:  Diagnosis Date  . Aortic aneurysm ([place]) 02/20/2016   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  . Arthritis   . Atrial fibrillation ([place]) 11/13/2014  . Cancer ([place])    skin  . Chronic combined systolic and diastolic heart failure ([place]) 02/20/2016   LVEF 45-50% 11/2014  . Coronary artery disease   . Depression   . Essential hypertension 11/13/2014  . Hyperlipidemia   . Hyperlipidemia 11/13/2014  . Hypertension   . Sleep apnea    CPAP 'Uses half the time"  last study 2009    Past Surgical History:  Procedure Laterality Date  . CARDIOVERSION N/A 12/06/2014   Procedure: CARDIOVERSION;  Surgeon: [name], MD;  Location: [facility];  Service: Cardiovascular;  Laterality: N/A;  . CARPAL TUNNEL RELEASE    . CORONARY ANGIOPLASTY     multiple stents  . CORONARY ARTERY BYPASS GRAFT  2006  . EYE SURGERY     right eye-  muscular repair  . TOTAL HIP ARTHROPLASTY  01/03/2012   Procedure: TOTAL HIP ARTHROPLASTY;  Surgeon: [name], MD;  Location: [facility];  Service: Orthopedics;  Laterality: Right;    Current Outpatient Medications  Medication Sig Dispense Refill  . albuterol (PROVENTIL HFA;VENTOLIN HFA) 108 (90 Base) MCG/ACT inhaler Inhale 2 puffs into the lungs every 6 (six) hours as needed for wheezing or shortness of breath. 1 Inhaler 0  .  Alirocumab (PRALUENT) 150 MG/ML SOPN Inject 1 pen into the skin every 14 (fourteen) days. (Patient taking differently: Inject 1 pen into the skin every 14 (fourteen) days. On Thursday) 2 pen 11  . apixaban (ELIQUIS) 5 MG TABS tablet Take 1 tablet (5 mg total) by mouth 2 (two) times daily. 180 tablet 2  . carvedilol (COREG) 12.5 MG tablet Take 12.5 mg by mouth 2 (two) times daily with a meal.    . furosemide (LASIX) 20 MG tablet Take 1 tablet (20 mg total) by mouth daily. 30 tablet 0  . levothyroxine (SYNTHROID, LEVOTHROID) 137 MCG tablet Take 137 mcg by mouth daily. Take 1 1/2 tablet only on wednesday    . losartan (COZAAR) 50 MG tablet  Take 1 tablet (50 mg total) by mouth daily. 30 tablet 0  . Multiple Vitamin (MULTIVITAMIN WITH MINERALS) TABS tablet Take 1 tablet by mouth 2 (two) times daily.    Marland Kitchen OVER THE COUNTER MEDICATION Take 1 tablet by mouth 2 (two) times daily. Take BLOOD SUGAR SUPPLEMENT     . sodium chloride (OCEAN) 0.65 % SOLN nasal spray Place 1 spray into both nostrils daily.    Marland Kitchen testosterone cypionate (DEPOTESTOSTERONE CYPIONATE) 200 MG/ML injection Inject 200 mg into the muscle every 14 (fourteen) days.    . TURMERIC PO Take 500 mg by mouth 2 (two) times daily.     Marland Kitchen venlafaxine XR (EFFEXOR-XR) 150 MG 24 hr capsule Take 150 mg by mouth every morning.     . B Complex Vitamins (B COMPLEX PO) Take 1 tablet by mouth daily.    . Cholecalciferol (VITAMIN D3) 5000 UNITS TABS Take 5,000 Units by mouth daily.    Marland Kitchen Dextromethorphan-guaiFENesin (CORICIDIN HBP CONGESTION/COUGH) 10-200 MG CAPS Take 2 tablets by mouth as needed.    Marland Kitchen ibuprofen (ADVIL,MOTRIN) 200 MG tablet Take 200 mg by mouth every 6 (six) hours as needed.     No current facility-administered medications for this visit.     Allergies:   Patient has no known allergies.    Social History:  The patient  reports that he quit smoking about 33 years ago. His smoking use included cigarettes. He has never used smokeless tobacco. He reports that he does not drink alcohol or use drugs.   Family Status: The patient indicated that his mother is deceased. He indicated that his father is deceased. He indicated that his brother is deceased. He indicated that his maternal grandmother is deceased. He indicated that his maternal grandfather is deceased. He indicated that his paternal grandmother is deceased. He indicated that his paternal grandfather is deceased.  Family History:  The patient's family history includes Heart attack in his maternal grandfather, mother, paternal grandfather, and paternal grandmother; Other in his father; Stroke in his maternal grandmother.     ROS:  Please see the history of present illness. All other systems are reviewed and negative.    PHYSICAL EXAM: VS:  BP 124/74 (BP Location: Left Arm)   Pulse 73   Ht 5\' 10"  (1.778 m)   Wt 242 lb 3.2 oz (109.9 kg)   BMI 34.75 kg/m  , BMI Body mass index is 34.75 kg/m. GEN: Well nourished, well developed, male in no acute distress  HEENT: normal for age  Neck: no JVD, no carotid bruit, no masses Cardiac: Irregular rate and rhythm; no murmur, no rubs, or gallops Respiratory:  clear to auscultation bilaterally, normal work of breathing GI: soft, nontender, nondistended, + BS MS: no deformity or atrophy; no  edema; distal pulses are 2+ in all 4 extremities   Skin: warm and dry, no rash Neuro:  Strength and sensation are intact Psych: euthymic mood, full affect   EKG:  EKG is ordered today. The ekg ordered today demonstrates atrial fib, heart rate 73, no significant morphology changes from 05/03/2017  ECHO: 05/04/2017 - Left ventricle: The cavity size was normal. There was moderate   concentric hypertrophy. Systolic function was mildly to   moderately reduced. The estimated ejection fraction was in the   range of 40% to 45%. Diffuse hypokinesis. - Aortic valve: Transvalvular velocity was within the normal range.   There was no stenosis. There was no regurgitation. - Mitral valve: Calcified annulus. Transvalvular velocity was   within the normal range. There was no evidence for stenosis.   There was no regurgitation. - Right ventricle: The cavity size was normal. Wall thickness was   normal. Systolic function was mildly reduced. - Atrial septum: No defect or patent foramen ovale was identified   by color flow Doppler. - Tricuspid valve: There was no regurgitation.   Recent Labs: 05/03/2017: B Natriuretic Peptide 350.9 05/04/2017: ALT 23; Magnesium 1.8 05/06/2017: BUN 23; Creatinine, Ser 0.79; Hemoglobin 15.4; Platelets 361; Potassium 3.3; Sodium 141    Lipid Panel    Component  Value Date/Time   CHOL 193 02/19/2015 0821   TRIG 110 02/19/2015 0821   HDL 36 (L) 02/19/2015 0821   CHOLHDL 5.4 (H) 02/19/2015 0821   VLDL 22 02/19/2015 0821   LDLCALC 135 (H) 02/19/2015 0821     Wt Readings from Last 3 Encounters:  05/19/17 242 lb 3.2 oz (109.9 kg)  05/05/17 239 lb 6.4 oz (108.6 kg)  04/12/17 245 lb (111.1 kg)     Other studies Reviewed: Additional studies/ records that were reviewed today include: Office notes, hospital records and testing.  ASSESSMENT AND PLAN:  1.  Persistent atrial fibrillation: He is having significant fatigue despite baseline volume status.  He believes this is from the atrial fib.  He wishes to pursue sinus rhythm.  I explained that if he waits until May 23, he does not have to have a TEE and this is preferable for him. -He prefers Dr. Oval Linsey to the cardioversion, she was contacted and agreed to do so. -It was set up for May 23 at 1 PM, he was given instructions. -Labs were drawn in the office today.  2.  Chronic anticoagulation: He was on Eliquis while he was in the hospital 5/1 and has not missed any doses since discharge.  He will have 3 weeks of continuous anticoagulation by 5/23  3.  Chronic combined systolic and diastolic CHF: By exam, his volume status is at baseline.  He is encouraged to do daily weights to follow this more carefully.  No medication changes.  Current medicines are reviewed at length with the patient today.  The patient does not have concerns regarding medicines.  The following changes have been made:  no change  Labs/ tests at his office visit 5/16 include:   Orders Placed This Encounter  Procedures  . INR/PT  . PT and PTT  . CBC  . Basic metabolic panel  . EKG 12-Lead    Disposition:   FU with Dr. Oval Linsey  Signed, Rosaria Ferries, PA-C  05/19/2017 4:54 PM    [place] Phone: [phone]; Fax: [phone]  This note was written with the assistance of speech  recognition software. Please excuse any transcriptional errors.

## 2017-05-19 NOTE — Patient Instructions (Addendum)
Medication Instructions:  Your physician recommends that you continue on your current medications as directed. Please refer to the Current Medication list given to you today.  Labwork: Your physician recommends that you return for lab work in: TODAY-PT/INR, BMP, CBC  Testing/Procedures: Your physician has recommended that you have a Cardioversion (DCCV). Electrical Cardioversion uses a jolt of electricity to your heart either through paddles or wired patches attached to your chest. This is a controlled, usually prescheduled, procedure. Defibrillation is done under light anesthesia in the hospital, and you usually go home the day of the procedure. This is done to get your heart back into a normal rhythm. You are not awake for the procedure. Please see the instruction sheet given to you today.  Follow-Up: KEEP YOUR FOLLOW UP APPOINTMENT AS SCHEDULED  Any Other Special Instructions Will Be Listed Below (If Applicable).  If you need a refill on your cardiac medications before your next appointment, please call your pharmacy.

## 2017-05-19 NOTE — Progress Notes (Signed)
Cardiology Office Note   Date:  05/19/2017   ID:  CADENCE Gary [name], DOB [birth year], MRN [id number]  PCP:  [name], MD  Cardiologist: Dr. Oval Linsey, 08/30/2016 Rosaria Ferries, PA-C   Chief Complaint  Patient presents with  . office visit    pt denies chest pains, pt does complain of SOB/weakness, denies swelling in hands/feet    History of Present Illness: Gary [name] is a 72 y.o. 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 fib and outpatient visit to discuss cardioversion planned, losartan increased for better blood pressure control, EF 40-45%  [name] presents for cardiology follow up.  He saw Dr Deforest Hoyles last week, had complete blood work done.   He sometimes feels better, but cannot exert himself significantly. Had to hire someone to do yard work.   Feels his SOB is better, but still has significant fatigue. Feels the fatigue is from the atrial fib.   Still coughing but it is now a dry cough, was a wet cough till a few days ago. No LE edema, no orthopnea, no PND. Is not doing daily weights, but he is weighing regularly. He does not think his weight has changed much although he notices it will jump up 3-4 lbs temporarily.   He is interested in the DCCV, is sure he has not missed any doses of Eliquis since d/c and was on Eliquis during his stay.   His weight is stable within about a 4 lb range. He has become unable to eat some things, such as ham, because they taste very salty.  He is very conscious of not adding salt to foods and choosing foods that are naturally low in sodium.  He never gets chest pain. His angina was a stinging sensation, he has not had that since the CABG.   No bleeding issues on the Eliquis.   Past Medical History:    Diagnosis Date  . Aortic aneurysm ([place]) 02/20/2016   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  . Arthritis   . Atrial fibrillation (Gary [name]) 11/13/2014  . Cancer (Gary [name])    skin  . Chronic combined systolic and diastolic heart failure (Gary [name]) 02/20/2016   LVEF 45-50% 11/2014  . Coronary artery disease   . Depression   . Essential hypertension 11/13/2014  . Hyperlipidemia   . Hyperlipidemia 11/13/2014  . Hypertension   . Sleep apnea    CPAP 'Uses half the time"  last study 2009    Past Surgical History:  Procedure Laterality Date  . CARDIOVERSION N/A 12/06/2014   Procedure: CARDIOVERSION;  Surgeon: Gary Latch, MD;  Location: [facility];  Service: Cardiovascular;  Laterality: N/A;  . CARPAL TUNNEL RELEASE    . CORONARY ANGIOPLASTY     multiple stents  . CORONARY ARTERY BYPASS GRAFT  2006  . EYE SURGERY     right eye-  muscular repair  . TOTAL HIP ARTHROPLASTY  01/03/2012   Procedure: TOTAL HIP ARTHROPLASTY;  Surgeon: Gary Bastos, MD;  Location: [facility];  Service: Orthopedics;  Laterality: Right;    Current Outpatient Medications  Medication Sig Dispense Refill  . albuterol (PROVENTIL HFA;VENTOLIN HFA) 108 (90 Base) MCG/ACT inhaler Inhale 2 puffs into the lungs every 6 (six) hours as needed for wheezing or shortness of breath. 1 Inhaler 0  . Alirocumab (  PRALUENT) 150 MG/ML SOPN Inject 1 pen into the skin every 14 (fourteen) days. (Patient taking differently: Inject 1 pen into the skin every 14 (fourteen) days. On Thursday) 2 pen 11  . apixaban (ELIQUIS) 5 MG TABS tablet Take 1 tablet (5 mg total) by mouth 2 (two) times daily. 180 tablet 2  . carvedilol (COREG) 12.5 MG tablet Take 12.5 mg by mouth 2 (two) times daily with a meal.    . furosemide (LASIX) 20 MG tablet Take 1 tablet (20 mg total) by mouth daily. 30 tablet 0  . levothyroxine (SYNTHROID, LEVOTHROID) 137 MCG tablet Take 137 mcg by mouth daily. Take 1 1/2 tablet only on wednesday    . losartan (COZAAR) 50 MG tablet Take 1  tablet (50 mg total) by mouth daily. 30 tablet 0  . Multiple Vitamin (MULTIVITAMIN WITH MINERALS) TABS tablet Take 1 tablet by mouth 2 (two) times daily.    Gary [name] OVER THE COUNTER MEDICATION Take 1 tablet by mouth 2 (two) times daily. Take BLOOD SUGAR SUPPLEMENT     . sodium chloride (OCEAN) 0.65 % SOLN nasal spray Place 1 spray into both nostrils daily.    Gary [name] testosterone cypionate (DEPOTESTOSTERONE CYPIONATE) 200 MG/ML injection Inject 200 mg into the muscle every 14 (fourteen) days.    . TURMERIC PO Take 500 mg by mouth 2 (two) times daily.     Gary [name] venlafaxine XR (EFFEXOR-XR) 150 MG 24 hr capsule Take 150 mg by mouth every morning.     . B Complex Vitamins (B COMPLEX PO) Take 1 tablet by mouth daily.    . Cholecalciferol (VITAMIN D3) 5000 UNITS TABS Take 5,000 Units by mouth daily.    Gary [name] Dextromethorphan-guaiFENesin (CORICIDIN HBP CONGESTION/COUGH) 10-200 MG CAPS Take 2 tablets by mouth as needed.    Gary [name] ibuprofen (ADVIL,MOTRIN) 200 MG tablet Take 200 mg by mouth every 6 (six) hours as needed.     No current facility-administered medications for this visit.     Allergies:   Patient has no known allergies.    Social History:  The patient  reports that he quit smoking about 33 years ago. His smoking use included cigarettes. He has never used smokeless tobacco. He reports that he does not drink alcohol or use drugs.   Family Status: The patient indicated that his mother is deceased. He indicated that his father is deceased. He indicated that his brother is deceased. He indicated that his maternal grandmother is deceased. He indicated that his maternal grandfather is deceased. He indicated that his paternal grandmother is deceased. He indicated that his paternal grandfather is deceased.  Family History:  The patient's family history includes Heart attack in his maternal grandfather, mother, paternal grandfather, and paternal grandmother; Other in his father; Stroke in his maternal grandmother.    ROS:   Please see the history of present illness. All other systems are reviewed and negative.    PHYSICAL EXAM: VS:  BP 124/74 (BP Location: Left Arm)   Pulse 73   Ht 5\' 10"  (1.778 m)   Wt 242 lb 3.2 oz (109.9 kg)   BMI 34.75 kg/m  , BMI Body mass index is 34.75 kg/m. GEN: Well nourished, well developed, male in no acute distress  HEENT: normal for age  Neck: no JVD, no carotid bruit, no masses Cardiac: Irregular rate and rhythm; no murmur, no rubs, or gallops Respiratory:  clear to auscultation bilaterally, normal work of breathing GI: soft, nontender, nondistended, + BS MS: no deformity or atrophy; no edema;  distal pulses are 2+ in all 4 extremities   Skin: warm and dry, no rash Neuro:  Strength and sensation are intact Psych: euthymic mood, full affect   EKG:  EKG is ordered today. The ekg ordered today demonstrates atrial fib, heart rate 73, no significant morphology changes from 05/03/2017  ECHO: 05/04/2017 - Left ventricle: The cavity size was normal. There was moderate   concentric hypertrophy. Systolic function was mildly to   moderately reduced. The estimated ejection fraction was in the   range of 40% to 45%. Diffuse hypokinesis. - Aortic valve: Transvalvular velocity was within the normal range.   There was no stenosis. There was no regurgitation. - Mitral valve: Calcified annulus. Transvalvular velocity was   within the normal range. There was no evidence for stenosis.   There was no regurgitation. - Right ventricle: The cavity size was normal. Wall thickness was   normal. Systolic function was mildly reduced. - Atrial septum: No defect or patent foramen ovale was identified   by color flow Doppler. - Tricuspid valve: There was no regurgitation.   Recent Labs: 05/03/2017: B Natriuretic Peptide 350.9 05/04/2017: ALT 23; Magnesium 1.8 05/06/2017: BUN 23; Creatinine, Ser 0.79; Hemoglobin 15.4; Platelets 361; Potassium 3.3; Sodium 141    Lipid Panel    Component Value  Date/Time   CHOL 193 02/19/2015 0821   TRIG 110 02/19/2015 0821   HDL 36 (L) 02/19/2015 0821   CHOLHDL 5.4 (H) 02/19/2015 0821   VLDL 22 02/19/2015 0821   LDLCALC 135 (H) 02/19/2015 0821     Wt Readings from Last 3 Encounters:  05/19/17 242 lb 3.2 oz (109.9 kg)  05/05/17 239 lb 6.4 oz (108.6 kg)  04/12/17 245 lb (111.1 kg)     Other studies Reviewed: Additional studies/ records that were reviewed today include: Office notes, hospital records and testing.  ASSESSMENT AND PLAN:  1.  Persistent atrial fibrillation: He is having significant fatigue despite baseline volume status.  He believes this is from the atrial fib.  He wishes to pursue sinus rhythm.  I explained that if he waits until May 23, he does not have to have a TEE and this is preferable for him. -He prefers Dr. Oval Linsey to the cardioversion, she was contacted and agreed to do so. -It was set up for May 23 at 1 PM, he was given instructions. -Labs were drawn in the office today.  2.  Chronic anticoagulation: He was on Eliquis while he was in the hospital 5/1 and has not missed any doses since discharge.  He will have 3 weeks of continuous anticoagulation by 5/23  3.  Chronic combined systolic and diastolic CHF: By exam, his volume status is at baseline.  He is encouraged to do daily weights to follow this more carefully.  No medication changes.  4.  Hypertension: Blood pressure is under good control today.  No med changes  Current medicines are reviewed at length with the patient today.  The patient does not have concerns regarding medicines.  The following changes have been made:  no change  Labs/ tests ordered today include:   Orders Placed This Encounter  Procedures  . INR/PT  . PT and PTT  . CBC  . Basic metabolic panel  . EKG 12-Lead     Disposition:   FU with Dr. Oval Linsey  Signed, Rosaria Ferries, PA-C  05/19/2017 4:54 PM    [place] Phone: [phone]; Fax: [phone]  This note was written with  the assistance of speech recognition software. Please excuse any transcriptional errors.

## 2017-05-20 DIAGNOSIS — L738 Other specified follicular disorders: Secondary | ICD-10-CM | POA: Diagnosis not present

## 2017-05-20 DIAGNOSIS — L570 Actinic keratosis: Secondary | ICD-10-CM | POA: Diagnosis not present

## 2017-05-20 DIAGNOSIS — D1801 Hemangioma of skin and subcutaneous tissue: Secondary | ICD-10-CM | POA: Diagnosis not present

## 2017-05-20 DIAGNOSIS — Z85828 Personal history of other malignant neoplasm of skin: Secondary | ICD-10-CM | POA: Diagnosis not present

## 2017-05-20 DIAGNOSIS — L821 Other seborrheic keratosis: Secondary | ICD-10-CM | POA: Diagnosis not present

## 2017-05-20 LAB — PT AND PTT
INR: 1 (ref 0.8–1.2)
Prothrombin Time: 10.4 s (ref 9.1–12.0)
aPTT: 31 s (ref 24–33)

## 2017-05-20 LAB — CBC
HEMATOCRIT: 44 % (ref 37.5–51.0)
HEMOGLOBIN: 14.1 g/dL (ref 13.0–17.7)
MCH: 26.6 pg (ref 26.6–33.0)
MCHC: 32 g/dL (ref 31.5–35.7)
MCV: 83 fL (ref 79–97)
Platelets: 433 10*3/uL — ABNORMAL HIGH (ref 150–379)
RBC: 5.3 x10E6/uL (ref 4.14–5.80)
RDW: 15.4 % (ref 12.3–15.4)
WBC: 10.2 10*3/uL (ref 3.4–10.8)

## 2017-05-20 LAB — BASIC METABOLIC PANEL
BUN/Creatinine Ratio: 20 (ref 10–24)
BUN: 18 mg/dL (ref 8–27)
CALCIUM: 9.4 mg/dL (ref 8.6–10.2)
CO2: 26 mmol/L (ref 20–29)
Chloride: 101 mmol/L (ref 96–106)
Creatinine, Ser: 0.89 mg/dL (ref 0.76–1.27)
GFR, EST AFRICAN AMERICAN: 97 mL/min/{1.73_m2} (ref >59)
GFR, EST NON AFRICAN AMERICAN: 84 mL/min/{1.73_m2} (ref >59)
Glucose: 97 mg/dL (ref 65–99)
POTASSIUM: 5.2 mmol/L (ref 3.5–5.2)
Sodium: 139 mmol/L (ref 134–144)

## 2017-05-26 ENCOUNTER — Ambulatory Visit (HOSPITAL_COMMUNITY): Payer: Medicare Other | Admitting: Certified Registered"

## 2017-05-26 ENCOUNTER — Encounter (HOSPITAL_COMMUNITY)
Admission: RE | Disposition: A | Discharge status: Home / Self Care | Payer: Self-pay | Source: Ambulatory Visit | Attending: Cardiovascular Disease

## 2017-05-26 ENCOUNTER — Ambulatory Visit (HOSPITAL_COMMUNITY)
Admission: RE | Admit: 2017-05-26 | Discharge: 2017-05-26 | Disposition: A | Discharge status: Home / Self Care | Payer: Medicare Other | Source: Ambulatory Visit | Attending: Cardiovascular Disease | Admitting: Cardiovascular Disease

## 2017-05-26 ENCOUNTER — Encounter (HOSPITAL_COMMUNITY): Payer: Self-pay | Admitting: *Deleted

## 2017-05-26 DIAGNOSIS — G4730 Sleep apnea, unspecified: Secondary | ICD-10-CM | POA: Diagnosis not present

## 2017-05-26 DIAGNOSIS — I4891 Unspecified atrial fibrillation: Secondary | ICD-10-CM | POA: Diagnosis not present

## 2017-05-26 DIAGNOSIS — E785 Hyperlipidemia, unspecified: Secondary | ICD-10-CM | POA: Diagnosis not present

## 2017-05-26 DIAGNOSIS — Z7901 Long term (current) use of anticoagulants: Secondary | ICD-10-CM | POA: Diagnosis not present

## 2017-05-26 DIAGNOSIS — I2510 Atherosclerotic heart disease of native coronary artery without angina pectoris: Secondary | ICD-10-CM | POA: Insufficient documentation

## 2017-05-26 DIAGNOSIS — F329 Major depressive disorder, single episode, unspecified: Secondary | ICD-10-CM | POA: Insufficient documentation

## 2017-05-26 DIAGNOSIS — I481 Persistent atrial fibrillation: Secondary | ICD-10-CM | POA: Diagnosis not present

## 2017-05-26 DIAGNOSIS — Z951 Presence of aortocoronary bypass graft: Secondary | ICD-10-CM | POA: Diagnosis not present

## 2017-05-26 DIAGNOSIS — I712 Thoracic aortic aneurysm, without rupture: Secondary | ICD-10-CM | POA: Insufficient documentation

## 2017-05-26 DIAGNOSIS — I5042 Chronic combined systolic (congestive) and diastolic (congestive) heart failure: Secondary | ICD-10-CM | POA: Insufficient documentation

## 2017-05-26 DIAGNOSIS — Z8249 Family history of ischemic heart disease and other diseases of the circulatory system: Secondary | ICD-10-CM | POA: Insufficient documentation

## 2017-05-26 DIAGNOSIS — I110 Hypertensive heart disease with heart failure: Secondary | ICD-10-CM | POA: Insufficient documentation

## 2017-05-26 DIAGNOSIS — Z87891 Personal history of nicotine dependence: Secondary | ICD-10-CM | POA: Diagnosis not present

## 2017-05-26 DIAGNOSIS — M1990 Unspecified osteoarthritis, unspecified site: Secondary | ICD-10-CM | POA: Diagnosis not present

## 2017-05-26 HISTORY — PX: CARDIOVERSION: SHX1299

## 2017-05-26 SURGERY — CARDIOVERSION
Anesthesia: General

## 2017-05-26 MED ORDER — SODIUM CHLORIDE 0.9 % IV SOLN: INTRAVENOUS | Status: DC

## 2017-05-26 MED ORDER — PROPOFOL 10 MG/ML IV BOLUS
INTRAVENOUS | Status: DC | PRN
  Administered 2017-05-26: 80 mg via INTRAVENOUS

## 2017-05-26 MED ORDER — LIDOCAINE 2% (20 MG/ML) 5 ML SYRINGE
INTRAMUSCULAR | Status: DC | PRN
  Administered 2017-05-26: 60 mg via INTRAVENOUS

## 2017-05-26 NOTE — Anesthesia Postprocedure Evaluation (Signed)
Anesthesia Post Note  Patient: Gary [name]  Procedure(s) Performed: CARDIOVERSION (N/A )     Patient location during evaluation: PACU Anesthesia Type: General Level of consciousness: awake and alert Pain management: pain level controlled Vital Signs Assessment: post-procedure vital signs reviewed and stable Respiratory status: spontaneous breathing, nonlabored ventilation, respiratory function stable and patient connected to nasal cannula oxygen Cardiovascular status: blood pressure returned to baseline and stable Postop Assessment: no apparent nausea or vomiting Anesthetic complications: no    Last Vitals:  Vitals:   05/26/17 1139 05/26/17 1339  BP: (!) 169/97 135/73  Pulse: 77 72  Resp: 19 (!) 24  Temp: 36.8 C 36.6 C  SpO2: 98% 96%    Last Pain:  Vitals:   05/26/17 1339  TempSrc: Oral  PainSc: 0-No pain                 [name]

## 2017-05-26 NOTE — Interval H&P Note (Signed)
History and Physical Interval Note:  05/26/2017 12:45 PM  Gary [name]  has presented today for surgery, with the diagnosis of AFIB  The various methods of treatment have been discussed with the patient and family. After consideration of risks, benefits and other options for treatment, the patient has consented to  Procedure(s): CARDIOVERSION (N/A) as a surgical intervention .  The patient's history has been reviewed, patient examined, no change in status, stable for surgery.  I have reviewed the patient's chart and labs.  Questions were answered to the patient's satisfaction.     [name], MD 05/26/2017 12:45 PM

## 2017-05-26 NOTE — Anesthesia Preprocedure Evaluation (Signed)
Anesthesia Evaluation  Patient identified by MRN, date of birth, ID band Patient awake    Reviewed: Allergy & Precautions, NPO status , Patient's Chart, lab work & pertinent test results  Airway Mallampati: I  TM Distance: >3 FB Neck ROM: Full    Dental   Pulmonary sleep apnea , former smoker,    Pulmonary exam normal        Cardiovascular hypertension, Pt. on medications Normal cardiovascular exam+ dysrhythmias Atrial Fibrillation      Neuro/Psych Depression    GI/Hepatic   Endo/Other    Renal/GU      Musculoskeletal   Abdominal   Peds  Hematology   Anesthesia Other Findings   Reproductive/Obstetrics                             Anesthesia Physical Anesthesia Plan  ASA: III  Anesthesia Plan: General   Post-op Pain Management:    Induction: Intravenous  PONV Risk Score and Plan: 2 and Ondansetron and Treatment may vary due to age or medical condition  Airway Management Planned: Mask  Additional Equipment:   Intra-op Plan:   Post-operative Plan:   Informed Consent: I have reviewed the patients History and Physical, chart, labs and discussed the procedure including the risks, benefits and alternatives for the proposed anesthesia with the patient or authorized representative who has indicated his/her understanding and acceptance.     Plan Discussed with: CRNA and Surgeon  Anesthesia Plan Comments:         Anesthesia Quick Evaluation

## 2017-05-26 NOTE — CV Procedure (Signed)
Electrical Cardioversion Procedure Note Gary [name] [id number] May 11, 1945  Procedure: Electrical Cardioversion Indications:  Atrial Fibrillation  Procedure Details Consent: Risks of procedure as well as the alternatives and risks of each were explained to the (patient/caregiver).  Consent for procedure obtained. Time Out: Verified patient identification, verified procedure, site/side was marked, verified correct patient position, special equipment/implants available, medications/allergies/relevent history reviewed, required imaging and test results available.  Performed  Patient placed on cardiac monitor, pulse oximetry, supplemental oxygen as necessary.  Sedation given: propofol Pacer pads placed anterior and posterior chest.  Cardioverted 1 time(s).  Cardioverted at 150J.  Evaluation Findings: Post procedure EKG shows: NSR Complications: None Patient did tolerate procedure well.   [name], MD 05/26/2017, 1:29 PM

## 2017-05-26 NOTE — Anesthesia Procedure Notes (Signed)
Procedure Name: General with mask airway Date/Time: 05/26/2017 1:25 PM Performed by: Imagene Riches, CRNA Pre-anesthesia Checklist: Patient identified, Emergency Drugs available, Suction available and Patient being monitored Patient Re-evaluated:Patient Re-evaluated prior to induction Oxygen Delivery Method: Ambu bag Preoxygenation: Pre-oxygenation with 100% oxygen

## 2017-05-26 NOTE — Discharge Instructions (Signed)
Please come to our [place] office in one week to have a basic metabolic panel checked.  Electrical Cardioversion, Care After This sheet gives you information about how to care for yourself after your procedure. Your health care provider may also give you more specific instructions. If you have problems or questions, contact your health care provider. What can I expect after the procedure? After the procedure, it is common to have:  Some redness on the skin where the shocks were given.  Follow these instructions at home:  Do not drive for 24 hours if you were given a medicine to help you relax (sedative).  Take over-the-counter and prescription medicines only as told by your health care provider.  Ask your health care provider how to check your pulse. Check it often.  Rest for 48 hours after the procedure or as told by your health care provider.  Avoid or limit your caffeine use as told by your health care provider. Contact a health care provider if:  You feel like your heart is beating too quickly or your pulse is not regular.  You have a serious muscle cramp that does not go away. Get help right away if:  You have discomfort in your chest.  You are dizzy or you feel faint.  You have trouble breathing or you are short of breath.  Your speech is slurred.  You have trouble moving an arm or leg on one side of your body.  Your fingers or toes turn cold or blue. This information is not intended to replace advice given to you by your health care provider. Make sure you discuss any questions you have with your health care provider. Document Released: 10/11/2012 Document Revised: 07/25/2015 Document Reviewed: 06/27/2015 Elsevier Interactive Patient Education  [employer].

## 2017-05-26 NOTE — Transfer of Care (Signed)
Immediate Anesthesia Transfer of Care Note  Patient: Gary [name]  Procedure(s) Performed: CARDIOVERSION (N/A )  Patient Location: Endoscopy Unit  Anesthesia Type:General  Level of Consciousness: drowsy  Airway & Oxygen Therapy: Patient Spontanous Breathing  Post-op Assessment: Report given to RN and Post -op Vital signs reviewed and stable  Post vital signs: Reviewed and stable  Last Vitals:  Vitals Value Taken Time  BP    Temp    Pulse    Resp    SpO2      Last Pain:  Vitals:   05/26/17 1139  TempSrc: Oral  PainSc: 0-No pain         Complications: No apparent anesthesia complications

## 2017-05-31 ENCOUNTER — Encounter: Payer: Self-pay | Admitting: Cardiovascular Disease

## 2017-05-31 DIAGNOSIS — H1089 Other conjunctivitis: Secondary | ICD-10-CM | POA: Diagnosis not present

## 2017-06-01 ENCOUNTER — Telehealth: Payer: Self-pay | Admitting: *Deleted

## 2017-06-01 DIAGNOSIS — Z5181 Encounter for therapeutic drug level monitoring: Secondary | ICD-10-CM

## 2017-06-01 DIAGNOSIS — I10 Essential (primary) hypertension: Secondary | ICD-10-CM

## 2017-06-01 NOTE — Telephone Encounter (Signed)
Left message to call back  

## 2017-06-01 NOTE — Telephone Encounter (Signed)
-----   Message from [name], MD sent at 05/26/2017  1:31 PM EDT ----- Please order BMP for next week.  Drug monitoring.

## 2017-06-07 ENCOUNTER — Encounter: Payer: Self-pay | Admitting: Cardiovascular Disease

## 2017-06-07 NOTE — Telephone Encounter (Signed)
Received my chart message patient coming tomorrow for labs

## 2017-06-08 DIAGNOSIS — Z5181 Encounter for therapeutic drug level monitoring: Secondary | ICD-10-CM | POA: Diagnosis not present

## 2017-06-08 DIAGNOSIS — I10 Essential (primary) hypertension: Secondary | ICD-10-CM | POA: Diagnosis not present

## 2017-06-08 LAB — BASIC METABOLIC PANEL
BUN / CREAT RATIO: 21 (ref 10–24)
BUN: 15 mg/dL (ref 8–27)
CHLORIDE: 105 mmol/L (ref 96–106)
CO2: 23 mmol/L (ref 20–29)
Calcium: 9.2 mg/dL (ref 8.6–10.2)
Creatinine, Ser: 0.73 mg/dL — ABNORMAL LOW (ref 0.76–1.27)
GFR calc Af Amer: 106 mL/min/{1.73_m2} (ref >59)
GFR calc non Af Amer: 91 mL/min/{1.73_m2} (ref >59)
GLUCOSE: 96 mg/dL (ref 65–99)
POTASSIUM: 4.6 mmol/L (ref 3.5–5.2)
Sodium: 140 mmol/L (ref 134–144)

## 2017-06-15 ENCOUNTER — Telehealth: Payer: Self-pay | Admitting: *Deleted

## 2017-06-15 NOTE — Telephone Encounter (Signed)
Spoke with patient and he thinks monitor not very accurate and has a new one. Readings better. He will bring his monitor to his follow up to check with office blood pressure    If BP is still running high recommend continuing carvedilol at 25mg  bid and increase losartan to 100mg  daily. Check BMP in one week.      ----- Message -----  From: Cristopher Estimable, RN  Sent: 05/31/2017  3:39 PM  To: [name], MD, Earvin Hansen  Subject: Melton Alar: Non-Urgent Medical Question               ----- Message -----  From: [name]  Sent: 05/31/2017  5:44 AM  To: [facility]  Subject: Non-Urgent Medical Question             ----- Message from [place], Generic sent at 05/31/2017 5:44 AM EDT -----    Dr. Oval Linsey: High blood pressure reading for last 2-3 days. Last 24 hours range has bee from 160 to 190 with lower reading ranging from 79 to 98, and h.b. in 60s. Doubled my Carvedilol (12.5 mg) last 12 hours from 1 to 2 so I have taken four in last 12 hours. Any suggestions? Cardio version last Thursday also appears I have a sinus infection, left eye hurts all time as if I stuck my finger in it. Have contacted int. Dr. about it.   Gary [name], [place] 1945-04-28   Telephone: [phone]

## 2017-06-16 ENCOUNTER — Telehealth: Payer: Self-pay

## 2017-06-16 NOTE — Telephone Encounter (Signed)
   [facility] Pre-operative Risk Assessment    Request for surgical clearance:  1. What type of surgery is being performed? Colonoscopy   2. When is this surgery scheduled? 07/25/17   3. What type of clearance is required (medical clearance vs. Pharmacy clearance to hold med vs. Both)? Both  4. Are there any medications that need to be held prior to surgery and how long? Eliquis   5. Practice name and name of physician performing surgery?  [facility]   6. What is your office phone number [phone]   7.   What is your office fax number [phone]   8.   Anesthesia type (None, local, MAC, general) ? Unknown   Meryl Crutch 06/16/2017, 5:38 PM  _________________________________________________________________   (provider comments below)

## 2017-06-20 DIAGNOSIS — H1032 Unspecified acute conjunctivitis, left eye: Secondary | ICD-10-CM | POA: Diagnosis not present

## 2017-06-20 NOTE — Telephone Encounter (Signed)
S/w pt is aware needs to keep appointment with Dr.[place] for upcoming colonoscopy to discuss Eliquis, or colonoscopy will be canceled. Pt is agreeable.

## 2017-06-20 NOTE — Telephone Encounter (Signed)
   Primary Corona, MD  Chart reviewed as part of pre-operative protocol coverage. Because of Gary [name] past medical history and time since last visit, he/she will require a follow-up visit in order to better assess preoperative cardiovascular risk.  He underwent Cardioversion 05/26/17 and should not come off Eliquis for 4 weeks so will have him seen the end of June or first of July for EKG and clearance.    Pre-op covering staff: - Please schedule appointment and call patient to inform them. - Please contact requesting surgeon's office via preferred method (i.e, phone, fax) to inform them of need for appointment prior to surgery.  Cecilie Kicks, NP  06/20/2017, 1:56 PM

## 2017-06-30 DIAGNOSIS — D473 Essential (hemorrhagic) thrombocythemia: Secondary | ICD-10-CM | POA: Diagnosis not present

## 2017-06-30 DIAGNOSIS — R7989 Other specified abnormal findings of blood chemistry: Secondary | ICD-10-CM | POA: Diagnosis not present

## 2017-07-04 DIAGNOSIS — H2512 Age-related nuclear cataract, left eye: Secondary | ICD-10-CM | POA: Diagnosis not present

## 2017-07-04 DIAGNOSIS — H26101 Unspecified traumatic cataract, right eye: Secondary | ICD-10-CM | POA: Diagnosis not present

## 2017-07-04 DIAGNOSIS — H524 Presbyopia: Secondary | ICD-10-CM | POA: Diagnosis not present

## 2017-07-20 ENCOUNTER — Encounter: Payer: Self-pay | Admitting: Cardiovascular Disease

## 2017-07-20 ENCOUNTER — Ambulatory Visit (INDEPENDENT_AMBULATORY_CARE_PROVIDER_SITE_OTHER): Payer: Medicare Other | Admitting: Cardiovascular Disease

## 2017-07-20 VITALS — BP 158/84 | HR 68 | Ht 70.0 in | Wt 243.0 lb

## 2017-07-20 DIAGNOSIS — I10 Essential (primary) hypertension: Secondary | ICD-10-CM | POA: Diagnosis not present

## 2017-07-20 DIAGNOSIS — I6523 Occlusion and stenosis of bilateral carotid arteries: Secondary | ICD-10-CM | POA: Diagnosis not present

## 2017-07-20 DIAGNOSIS — I480 Paroxysmal atrial fibrillation: Secondary | ICD-10-CM | POA: Diagnosis not present

## 2017-07-20 DIAGNOSIS — Z5181 Encounter for therapeutic drug level monitoring: Secondary | ICD-10-CM | POA: Diagnosis not present

## 2017-07-20 DIAGNOSIS — I481 Persistent atrial fibrillation: Secondary | ICD-10-CM

## 2017-07-20 DIAGNOSIS — I4819 Other persistent atrial fibrillation: Secondary | ICD-10-CM

## 2017-07-20 DIAGNOSIS — E7800 Pure hypercholesterolemia, unspecified: Secondary | ICD-10-CM | POA: Diagnosis not present

## 2017-07-20 MED ORDER — LOSARTAN POTASSIUM-HCTZ 100-12.5 MG PO TABS: 1.0000 | ORAL_TABLET | Freq: Every day | ORAL | 3 refills | Status: DC

## 2017-07-20 NOTE — Patient Instructions (Signed)
Medication Instructions:  STOP LOSARTAN   START LOSARTAN HCT 100-12.5 MG DAILY   Labwork: BMET IN 1 WEEK   Testing/Procedures: NONE  Follow-Up: Your physician recommends that you schedule a follow-up appointment in: [place]  Your physician recommends that you schedule a follow-up appointment in: [place]   If you need a refill on your cardiac medications before your next appointment, please call your pharmacy.

## 2017-07-20 NOTE — Progress Notes (Signed)
Cardiology Office Note   Date:  07/20/2017   ID:  Gary [name], DOB 11-Sep-1945, MRN [id number]  PCP:  [name], MD  Cardiologist:   [name], MD   No chief complaint on file.   Patient ID: Gary [name] is a 72 y.o. male with hypertension, CAD s/p PCI and CABG, mild ascending aortic aneurysm, chronic systolic and diastolic heart failure, hyperlipidemia, paroxysmal atrial fibrillation, and OSA non-compliant with CPAP who presents for follow up.  [name] was first seen 11/2014 after being diagnosed with atrial fibrillation.  He felt tired and underwent successful DCCV on 12/2014.  He had an echo 11/2014 that revealed LVEF 45-50% with inferolateral hypokinesis.  He also was noted to have an ascending aorta aneurysm (4.0cm).  Repeat echo 03/2016 was unchanged. He then had a Lexiscan Myoview 11/27/14 that revealed a small, fixed inferolateral defect.  He was noted to have a carotid bruit so he was referred for carotid Doppler 02/2015 that revealed 1-39% ICA stenosis bilaterally.    At his last appointment [name]'s blood pressure is elevated.  He was asked to keep a log of his pressure at home given that he felt his otherwise been well controlled.  However it was elevated at home so he was instructed to increase losartan to 100 mg and carvedilol to 25 mg.  He did not make these changes because they were called into the pharmacy.  His blood pressure continues to run in the 374M systolic when he checks his arm but 110s to 120s when he checks on his wrist.  He has been feeling poorly mostly due to sinus congestion and eye infections.  He has not had any chest pain or shortness of breath.  He denies lower extremity edema, orthopnea, or PND.  He ran out of furosemide and has not needed any lately.He wonders if his sinus congestion is due to his CPAP machine.   Past Medical History:  Diagnosis Date  . Aortic aneurysm ([place]) 02/20/2016   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  .  Arthritis   . Atrial fibrillation ([place]) 11/13/2014  . Cancer ([place])    skin  . Chronic combined systolic and diastolic heart failure ([place]) 02/20/2016   LVEF 45-50% 11/2014  . Coronary artery disease   . Depression   . Essential hypertension 11/13/2014  . Hyperlipidemia   . Hyperlipidemia 11/13/2014  . Hypertension   . Sleep apnea    CPAP 'Uses half the time"  last study 2009    Past Surgical History:  Procedure Laterality Date  . CARDIOVERSION N/A 12/06/2014   Procedure: CARDIOVERSION;  Surgeon: [name], MD;  Location: [facility];  Service: Cardiovascular;  Laterality: N/A;  . CARDIOVERSION N/A 05/26/2017   Procedure: CARDIOVERSION;  Surgeon: [name], MD;  Location: [facility];  Service: Cardiovascular;  Laterality: N/A;  . CARPAL TUNNEL RELEASE    . CORONARY ANGIOPLASTY     multiple stents  . CORONARY ARTERY BYPASS GRAFT  2006  . EYE SURGERY     right eye-  muscular repair  . TOTAL HIP ARTHROPLASTY  01/03/2012   Procedure: TOTAL HIP ARTHROPLASTY;  Surgeon: [name], MD;  Location: [facility];  Service: Orthopedics;  Laterality: Right;     Current Outpatient Medications  Medication Sig Dispense Refill  . Alirocumab (PRALUENT) 150 MG/ML SOPN Inject 1 pen into the skin every 14 (fourteen) days. (Patient taking differently: Inject 1 pen into the skin every 14 (fourteen) days. On Thursday) 2 pen 11  .  ALPRAZolam (XANAX) 1 MG tablet Take 0.5 mg by mouth at bedtime.  1  . apixaban (ELIQUIS) 5 MG TABS tablet Take 1 tablet (5 mg total) by mouth 2 (two) times daily. 180 tablet 2  . carvedilol (COREG) 12.5 MG tablet Take 12.5 mg by mouth 2 (two) times daily with a meal.    . CINNAMON PO Take 1,200 mg by mouth 2 (two) times daily.    . Coenzyme Q10 (CO Q 10 PO) Take 1 capsule by mouth daily.    Marland Kitchen levothyroxine (SYNTHROID, LEVOTHROID) 137 MCG tablet Take 137-205.5 mcg by mouth See admin instructions. Take 205.5 mcg by mouth on Wednesday. Take 137 mcg by mouth daily on  all other days    . Multiple Vitamin (MULTIVITAMIN WITH MINERALS) TABS tablet Take 1 tablet by mouth daily.     . sodium chloride (OCEAN) 0.65 % SOLN nasal spray Place 1 spray into both nostrils at bedtime.     Marland Kitchen testosterone cypionate (DEPOTESTOSTERONE CYPIONATE) 200 MG/ML injection Inject 200 mg into the muscle every 14 (fourteen) days.    . Turmeric 500 MG CAPS Take 500 mg by mouth 2 (two) times daily.     Marland Kitchen venlafaxine XR (EFFEXOR-XR) 150 MG 24 hr capsule Take 150 mg by mouth every morning.     Marland Kitchen albuterol (PROVENTIL HFA;VENTOLIN HFA) 108 (90 Base) MCG/ACT inhaler Inhale 2 puffs into the lungs every 6 (six) hours as needed for wheezing or shortness of breath. (Patient not taking: Reported on 07/20/2017) 1 Inhaler 0  . losartan-hydrochlorothiazide (HYZAAR) 100-12.5 MG tablet Take 1 tablet by mouth daily. 90 tablet 3   No current facility-administered medications for this visit.     Allergies:   Patient has no known allergies.    Social History:  The patient  reports that he quit smoking about 33 years ago. His smoking use included cigarettes. He has never used smokeless tobacco. He reports that he does not drink alcohol or use drugs.   Family History:  The patient's family history includes Heart attack in his maternal grandfather, mother, paternal grandfather, and paternal grandmother; Other in his father; Stroke in his maternal grandmother.    ROS:  Please see the history of present illness.   Otherwise, review of systems are positive for none.   All other systems are reviewed and negative.    PHYSICAL EXAM: VS:  BP (!) 158/84   Pulse 68   Ht 5\' 10"  (1.778 m)   Wt 243 lb (110.2 kg)   SpO2 96%   BMI 34.87 kg/m  , BMI Body mass index is 34.87 kg/m. GENERAL:  Well appearing HEENT: Pupils equal round and reactive, fundi not visualized, oral mucosa unremarkable NECK:  No jugular venous distention, waveform within normal limits, carotid upstroke brisk and symmetric, no bruits, no  thyromegaly LYMPHATICS:  No cervical adenopathy LUNGS:  Clear to auscultation bilaterally HEART:  RRR.  PMI not displaced or sustained,S1 and S2 within normal limits, no S3, no S4, no clicks, no rubs, no 4/62/7035: Sinus rhythm.  Rate 68 bpm.  Murmurs ABD:  Flat, positive bowel sounds normal in frequency in pitch, no bruits, no rebound, no guarding, no midline pulsatile mass, no hepatomegaly, no splenomegaly EXT:  2 plus pulses throughout, no edema, no cyanosis no clubbing SKIN:  No rashes no nodules NEURO:  Cranial nerves II through XII grossly intact, motor grossly intact throughout PSYCH:  Cognitively intact, oriented to person place and time   EKG:  EKG is ordered today. 02/20/16:  Sinus rhythm. Rate 66 bpm. Left ventricular hypertrophy with repolarization abnormality. 08/30/16:  Sinus rhythm. Rate 66 bpm. LAFB. Poor R wave progression. 04/12/17: Sinus rhythm.  Rate 65 bpm.  Prior anteroseptal infarct.  Nonspecific ST changes.  No recurrent episodes Prior septal infarct.   Echo 11/20/14: Study Conclusions  - Left ventricle: The cavity size was normal. Wall thickness was   increased in a pattern of moderate LVH. Systolic function was   mildly reduced. The estimated ejection fraction was in the range   of 45% to 50%. Inferiorlateral hypokinesis. Abnormal GLPSS at   -11%, with inferolateral strain abnormality. The study is not   technically sufficient to allow evaluation of LV diastolic   function. - Aorta: Ascending aortic diameter: 40 mm (S). - Ascending aorta: The ascending aorta was mildly dilated. - Left atrium: The atrium was normal in size. - Right atrium: Severely dilated at 30 cm2. - Systemic veins: The IVC measures <2.1 cm, but does not collapse   >50%, suggesting an elevated RA pressure of 8 mmHg.  Lexiscan Myoview 11/27/14:  There was no ST segment deviation noted during stress.  No T wave inversion was noted during stress.  Defect 1: There is a small defect of  moderate severity.  Small size, moderate-intensity fixed inferolateral wall defect - could represent artifact or scar. The study was non-gated, therefore LVEF was not calculated and wall motion abnormalities could not be determined. Based on the lack of significant reversible ischemia, this is a low risk study.  Carotid Doppler 02/19/15: 1-39% ICA stenosis bilaterally  Recent Labs: 05/03/2017: B Natriuretic Peptide 350.9 05/04/2017: ALT 23; Magnesium 1.8 05/19/2017: Hemoglobin 14.1; Platelets 433 06/08/2017: BUN 15; Creatinine, Ser 0.73; Potassium 4.6; Sodium 140    Lipid Panel    Component Value Date/Time   CHOL 193 02/19/2015 0821   TRIG 110 02/19/2015 0821   HDL 36 (L) 02/19/2015 0821   CHOLHDL 5.4 (H) 02/19/2015 0821   VLDL 22 02/19/2015 0821   LDLCALC 135 (H) 02/19/2015 2263     07/21/16: Total cholesterol 105, triglycerides 94, HDL 36, LDL 49  Wt Readings from Last 3 Encounters:  07/20/17 243 lb (110.2 kg)  05/26/17 242 lb 3.2 oz (109.9 kg)  05/19/17 242 lb 3.2 oz (109.9 kg)      ASSESSMENT AND PLAN:  # Atrial fibrillation: No recurrent episodes.  Continue carvedilol and Eliquis.  OK to hold Eliquis for colonoscopy but he has decided not to have it anymore. This patients CHA2DS2-VASc Score and unadjusted Ischemic Stroke Rate (% per year) is equal to 3.2 % stroke rate/year from a score of 3  Above score calculated as 1 point each if present [CHF, HTN, DM, Vascular=MI/PAD/Aortic Plaque, Age if 63-74, or Male.  Above score calculated as 2 points each if present [Age > 75, or Stroke/TIA/TE]  # CAD s/p CABG and PCI: 11/2014 [name] has not experienced any chest pain recently.  Cardiolite  showed a fixed defect, either infarct or artifact but no ischemia.  Continue losartan and carvedilol.  No aspirin on Eliquis.    # Carotid stenosis:  Mild carotid stenosis noted on carotid Dopplers 02/2015.  Repeat carotid Dopplers.  # Hypertension:  BP poorly controlled.  [name]  notes fatigue on carvedilol.  We will not increase the dose.  We will switch his losartan to losartan/HCTZ 100/12.5 mg daily.  He will check a basic metabolic panel in 1 week.  He will follow-up with our pharmacist in 1 month.  # Hyperlipidemia:  Lipids are very well-controlled since starting Praluent. Repeat lipids at follow up.  # Aortic aneurysm: Ascending aorta was 4.0 cm 11/2013 and 03/2016.  The patient does not have concerns regarding medicines.  The following changes have been made:  no change  Labs/ tests ordered today include:    Disposition:   FU with Omeka Holben C. Oval Linsey, MD in  3 months.  PharmD in one month.    Signed, [name], MD  07/20/2017 4:59 PM    [facility]

## 2017-07-27 DIAGNOSIS — Z5181 Encounter for therapeutic drug level monitoring: Secondary | ICD-10-CM | POA: Diagnosis not present

## 2017-07-27 DIAGNOSIS — I10 Essential (primary) hypertension: Secondary | ICD-10-CM | POA: Diagnosis not present

## 2017-07-28 LAB — BASIC METABOLIC PANEL
BUN/Creatinine Ratio: 18 (ref 10–24)
BUN: 13 mg/dL (ref 8–27)
CALCIUM: 9 mg/dL (ref 8.6–10.2)
CHLORIDE: 101 mmol/L (ref 96–106)
CO2: 26 mmol/L (ref 20–29)
CREATININE: 0.73 mg/dL — AB (ref 0.76–1.27)
GFR calc Af Amer: 106 mL/min/{1.73_m2} (ref >59)
GFR calc non Af Amer: 91 mL/min/{1.73_m2} (ref >59)
GLUCOSE: 179 mg/dL — AB (ref 65–99)
Potassium: 4.5 mmol/L (ref 3.5–5.2)
Sodium: 141 mmol/L (ref 134–144)

## 2017-08-02 DIAGNOSIS — R6884 Jaw pain: Secondary | ICD-10-CM | POA: Diagnosis not present

## 2017-08-02 DIAGNOSIS — J0100 Acute maxillary sinusitis, unspecified: Secondary | ICD-10-CM | POA: Diagnosis not present

## 2017-08-02 DIAGNOSIS — J329 Chronic sinusitis, unspecified: Secondary | ICD-10-CM | POA: Diagnosis not present

## 2017-08-02 DIAGNOSIS — J342 Deviated nasal septum: Secondary | ICD-10-CM | POA: Diagnosis not present

## 2017-08-09 DIAGNOSIS — R7303 Prediabetes: Secondary | ICD-10-CM | POA: Diagnosis not present

## 2017-08-09 DIAGNOSIS — R0981 Nasal congestion: Secondary | ICD-10-CM | POA: Diagnosis not present

## 2017-08-09 DIAGNOSIS — M2550 Pain in unspecified joint: Secondary | ICD-10-CM | POA: Diagnosis not present

## 2017-08-22 ENCOUNTER — Encounter: Payer: Self-pay | Admitting: Pharmacist Clinician (PhC)/ Clinical Pharmacy Specialist

## 2017-08-22 ENCOUNTER — Ambulatory Visit (INDEPENDENT_AMBULATORY_CARE_PROVIDER_SITE_OTHER): Payer: Medicare Other | Admitting: Pharmacist Clinician (PhC)/ Clinical Pharmacy Specialist

## 2017-08-22 DIAGNOSIS — I6523 Occlusion and stenosis of bilateral carotid arteries: Secondary | ICD-10-CM | POA: Diagnosis not present

## 2017-08-22 DIAGNOSIS — I10 Essential (primary) hypertension: Secondary | ICD-10-CM | POA: Diagnosis not present

## 2017-08-22 MED ORDER — AMLODIPINE BESYLATE 2.5 MG PO TABS: 2.5000 mg | ORAL_TABLET | Freq: Every day | ORAL | 6 refills | Status: DC

## 2017-08-22 NOTE — Progress Notes (Signed)
08/22/2017 Gary [name] 06-Nov-1945 [id number]   HPI:  Gary [name] is a 72 y.o. male patient of Dr Oval Linsey, with a [place] below who presents today for hypertension clinic evaluation.  In addition to hypertension, his medical history is significant for CAD s/p PCI and CABG, ascending aortic aneurysm, chronic systolic/diastolic heart failure, hyperlipidemia, PAF and OSA (not currently compliant with CPAP).    His blood pressure was elevated in June based on home readings, and patient called into office for advice.  It was recommended that he increase his carvedilol to 25 mg bid and losartan to 100 mg qd.  He did not increase either medication, as apparently they were not called to pharmacy with increased dose.  He then saw Dr. Oval Linsey on 7/17 and was noted to have a pressure of 158/84.  Patient reported that carvedilol makes him feel fatigued, so his losartan was changed to losartan hctz 100/12.5.  He returns today for follow up.  Patient was hospitalized in late April and diagnosed with metapneumovirus.  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  Has to d/c CPAP because the congestion makes using CPAP difficult for him.  Has been using coricidin max strength for past week or two to help.    Blood Pressure Goal:  130/80  Current Medications:  Carvedilol 12.5 mg bid  Losartan hctz 100/12.5 mg qd - pm  Family Hx:  Both parents had hypertension,  Mother had CHF (several siblings with MI and/or stroke)  Father had stroke, MI in his 35's (was 44 of 56, 5 with MI's)  2 brothers, 1 died at 42 with MI, other 60 now with CABG  2 sons - neither has been to MD in years.     Social Hx:  No tobacco, rare alcohol, no soda, 1-2 coffee per day  Diet:  Eats out regularly, avoids fast food; does add some salt ) to only few foods (eggs and potatoes), has cut way back in past couple of years.    Exercise:  Not currently, having problems with legs being tired, arthritis in knees  Home BP readings:  Has 3 cuffs, 2 wrist, 1 arm;  Arm running 160-200/90-105; wrist cuffs reading 120-140. Did not bring any of them with him today.    Intolerances:   knda  Labs:  07/27/17:  Na 141, K 4.5, Glu 179, BUN 13, SCr 0.73  (CrCl 138.4) Wt Readings from Last 3 Encounters:  07/20/17 243 lb (110.2 kg)  05/26/17 242 lb 3.2 oz (109.9 kg)  05/19/17 242 lb 3.2 oz (109.9 kg)   BP Readings from Last 3 Encounters:  08/22/17 (!) 152/88  07/20/17 (!) 158/84  05/26/17 137/78   Pulse Readings from Last 3 Encounters:  08/22/17 66  07/20/17 68  05/26/17 73    Current Outpatient Medications  Medication Sig Dispense Refill  . albuterol (PROVENTIL HFA;VENTOLIN HFA) 108 (90 Base) MCG/ACT inhaler Inhale 2 puffs into the lungs every 6 (six) hours as needed for wheezing or shortness of breath. (Patient not taking: Reported on 07/20/2017) 1 Inhaler 0  . Alirocumab (PRALUENT) 150 MG/ML SOPN Inject 1 pen into the skin every 14 (fourteen) days. (Patient taking differently: Inject 1 pen into the skin every 14 (fourteen) days. On Thursday) 2 pen 11  . ALPRAZolam (XANAX) 1 MG tablet Take 0.5 mg by mouth at bedtime.  1  . amLODipine (NORVASC) 2.5 MG tablet Take 1 tablet (2.5  mg total) by mouth daily. 30 tablet 6  . apixaban (ELIQUIS) 5 MG TABS tablet Take 1 tablet (5 mg total) by mouth 2 (two) times daily. 180 tablet 2  . carvedilol (COREG) 12.5 MG tablet Take 12.5 mg by mouth 2 (two) times daily with a meal.    . CINNAMON PO Take 1,200 mg by mouth 2 (two) times daily.    . Coenzyme Q10 (CO Q 10 PO) Take 1 capsule by mouth daily.    Marland Kitchen levothyroxine (SYNTHROID, LEVOTHROID) 137 MCG tablet Take 137-205.5 mcg by mouth See admin instructions. Take 205.5 mcg by mouth on Wednesday. Take 137 mcg by mouth daily on all other days    . losartan-hydrochlorothiazide (HYZAAR) 100-12.5 MG tablet Take 1 tablet by mouth daily. 90  tablet 3  . Multiple Vitamin (MULTIVITAMIN WITH MINERALS) TABS tablet Take 1 tablet by mouth daily.     . sodium chloride (OCEAN) 0.65 % SOLN nasal spray Place 1 spray into both nostrils at bedtime.     Marland Kitchen testosterone cypionate (DEPOTESTOSTERONE CYPIONATE) 200 MG/ML injection Inject 200 mg into the muscle every 14 (fourteen) days.    . Turmeric 500 MG CAPS Take 500 mg by mouth 2 (two) times daily.     Marland Kitchen venlafaxine XR (EFFEXOR-XR) 150 MG 24 hr capsule Take 150 mg by mouth every morning.      No current facility-administered medications for this visit.     No Known Allergies  Past Medical History:  Diagnosis Date  . Aortic aneurysm ([place]) 02/20/2016   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  . Arthritis   . Atrial fibrillation ([place]) 11/13/2014  . Cancer ([place])    skin  . Chronic combined systolic and diastolic heart failure ([place]) 02/20/2016   LVEF 45-50% 11/2014  . Coronary artery disease   . Depression   . Essential hypertension 11/13/2014  . Hyperlipidemia   . Hyperlipidemia 11/13/2014  . Hypertension   . Sleep apnea    CPAP 'Uses half the time"  last study 2009    Blood pressure (!) 152/88, pulse 66.  Essential hypertension Patient with essential hypertension, currently not well controlled.  Will start him on amlodipine 2.5 mg daily in the mornings, have him continue with all other medications.  He is to check his BP at home daily with the arm cuff and record his readings.  We will see him back in 3 weeks for follow up at which time he can bring all 3 cuffs as well as the log of home readings.  He was encouraged to start some exercise.     Tommy Medal PharmD CPP [facility] [address] [place], [place] [address] [phone]

## 2017-08-22 NOTE — Patient Instructions (Addendum)
You were diagnosed with Metapneumovirus back in May  Return for a a follow up appointment in 3 weeks - Sept 9 at 9:30am  Your blood pressure today is 152/88  Check your blood pressure at home daily and keep record of the readings.  Take your BP meds as follows:  Start amlodipine 2.5 mg once daily in the mornings  Continue with all other medications  Bring all of your meds, your BP cuff and your record of home blood pressures to your next appointment.  Exercise as you're able, try to walk approximately 30 minutes per day.  Keep salt intake to a minimum, especially watch canned and prepared boxed foods.  Eat more fresh fruits and vegetables and fewer canned items.  Avoid eating in fast food restaurants.    HOW TO TAKE YOUR BLOOD PRESSURE: . Rest 5 minutes before taking your blood pressure. .  Don't smoke or drink caffeinated beverages for at least 30 minutes before. . Take your blood pressure before (not after) you eat. . Sit comfortably with your back supported and both feet on the floor (don't cross your legs). . Elevate your arm to heart level on a table or a desk. . Use the proper sized cuff. It should fit smoothly and snugly around your bare upper arm. There should be enough room to slip a fingertip under the cuff. The bottom edge of the cuff should be 1 inch above the crease of the elbow. . Ideally, take 3 measurements at one sitting and record the average.

## 2017-08-22 NOTE — Assessment & Plan Note (Signed)
Patient with essential hypertension, currently not well controlled.  Will start him on amlodipine 2.5 mg daily in the mornings, have him continue with all other medications.  He is to check his BP at home daily with the arm cuff and record his readings.  We will see him back in 3 weeks for follow up at which time he can bring all 3 cuffs as well as the log of home readings.  He was encouraged to start some exercise.

## 2017-08-29 DIAGNOSIS — R0981 Nasal congestion: Secondary | ICD-10-CM | POA: Diagnosis not present

## 2017-08-29 DIAGNOSIS — M2550 Pain in unspecified joint: Secondary | ICD-10-CM | POA: Diagnosis not present

## 2017-08-29 DIAGNOSIS — M791 Myalgia, unspecified site: Secondary | ICD-10-CM | POA: Diagnosis not present

## 2017-08-30 ENCOUNTER — Telehealth: Payer: Self-pay | Admitting: Cardiovascular Disease

## 2017-08-30 MED ORDER — EZETIMIBE 10 MG PO TABS: 10.0000 mg | ORAL_TABLET | Freq: Every day | ORAL | 1 refills | Status: DC

## 2017-08-30 NOTE — Telephone Encounter (Signed)
New Message:      Pt states his internist feels like this medication praluent is causing a reaction so they would like for him to be off this medication for about 4-6 wks and so he would like a new prescription for Zadia to take in the mean time.

## 2017-08-30 NOTE — Telephone Encounter (Signed)
Praluent ON HOLD, eztimibe 10mg  Rx sent to prefer pharmacy

## 2017-09-12 ENCOUNTER — Ambulatory Visit (INDEPENDENT_AMBULATORY_CARE_PROVIDER_SITE_OTHER): Payer: Medicare Other | Admitting: Pharmacist Clinician (PhC)/ Clinical Pharmacy Specialist

## 2017-09-12 ENCOUNTER — Other Ambulatory Visit: Payer: Self-pay | Admitting: Cardiovascular Disease

## 2017-09-12 DIAGNOSIS — I6523 Occlusion and stenosis of bilateral carotid arteries: Secondary | ICD-10-CM | POA: Diagnosis not present

## 2017-09-12 DIAGNOSIS — I10 Essential (primary) hypertension: Secondary | ICD-10-CM

## 2017-09-12 NOTE — Patient Instructions (Addendum)
Return for a a follow up appointment in 3-4 weeks  Call when you get a new cuff and we will have you stop by and verify its accuracy  Your blood pressure today is 138/80  Check your blood pressure at home daily and keep record of the readings.  Take your BP meds as follows:  Continue with all your current medications  Bring all of your meds, your BP cuff and your record of home blood pressures to your next appointment.  Exercise as you're able, try to walk approximately 30 minutes per day.  Keep salt intake to a minimum, especially watch canned and prepared boxed foods.  Eat more fresh fruits and vegetables and fewer canned items.  Avoid eating in fast food restaurants.    HOW TO TAKE YOUR BLOOD PRESSURE: . Rest 5 minutes before taking your blood pressure. .  Don't smoke or drink caffeinated beverages for at least 30 minutes before. . Take your blood pressure before (not after) you eat. . Sit comfortably with your back supported and both feet on the floor (don't cross your legs). . Elevate your arm to heart level on a table or a desk. . Use the proper sized cuff. It should fit smoothly and snugly around your bare upper arm. There should be enough room to slip a fingertip under the cuff. The bottom edge of the cuff should be 1 inch above the crease of the elbow. . Ideally, take 3 measurements at one sitting and record the average.

## 2017-09-12 NOTE — Progress Notes (Signed)
09/13/2017 Gary [name] [birth year] [id number]   HPI:  Gary [name] is a 72 y.o. male patient of Dr Oval Linsey, with a [place] below who presents today for hypertension clinic evaluation.  In addition to hypertension, his medical history is significant for CAD s/p PCI and CABG, ascending aortic aneurysm, chronic systolic/diastolic heart failure, hyperlipidemia, PAF and OSA (not currently compliant with CPAP).    His blood pressure was elevated in June based on home readings, and patient called into office for advice.  It was recommended that he increase his carvedilol to 25 mg bid and losartan to 100 mg qd.  He did not increase either medication, as apparently they were not called to pharmacy with increased dose.  He then saw Dr. Oval Linsey on 7/17 and was noted to have a pressure of 158/84.  Patient reported that carvedilol makes him feel fatigued, therefore his losartan was changed to losartan hctz 100/12.5.  At his last visit we added amlodipine 2.5 mg.  He returns today for follow up.   Patient was hospitalized in late April and diagnosed with metapneumovirus.  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  Today he reports that except for the arthritis, these symptoms have mostly resolved.     Blood Pressure Goal:  130/80  Current Medications:  Carvedilol 12.5 mg bid  Losartan hctz 100/12.5 mg qd - pm  Amlodipine 2.5 mg qd  Family Hx:  Both parents had hypertension,  Mother had CHF (several siblings with MI and/or stroke)  Father had stroke, MI in his 3's (was 4 of 90, 15 with MI's)  2 brothers, 1 died at 84 with MI, other 51 now with CABG  2 sons - neither has been to MD in years.     Social Hx:  No tobacco, rare alcohol, no soda, 1-2 coffee per day  Diet:  Eats out regularly, avoids fast food; does add some salt ) to only few foods (eggs and potatoes), has cut way back in past couple of  years.   Exercise:  Not currently, having problems with legs being tired, arthritis in knees  Home BP readings:  Has 3 cuffs, 2 wrist, 1 arm;  He brought arm and 1 wrist cuff in today.  Unfortunately, when compared to the office readings, his arm cuff (avg 164/93) read 20/10 points high, while the wrist cuff (avg 127/71) read 20/8 points low.   So unfortunately, we don't have any good home readings on which to base decisions.    Intolerances:   knda  Labs:  07/27/17:  Na 141, K 4.5, Glu 179, BUN 13, SCr 0.73  (CrCl 138.4) Wt Readings from Last 3 Encounters:  07/20/17 243 lb (110.2 kg)  05/26/17 242 lb 3.2 oz (109.9 kg)  05/19/17 242 lb 3.2 oz (109.9 kg)   BP Readings from Last 3 Encounters:  09/12/17 138/80  08/22/17 (!) 152/88  07/20/17 (!) 158/84   Pulse Readings from Last 3 Encounters:  09/12/17 68  08/22/17 66  07/20/17 68    Current Outpatient Medications  Medication Sig Dispense Refill  . albuterol (PROVENTIL HFA;VENTOLIN HFA) 108 (90 Base) MCG/ACT inhaler Inhale 2 puffs into the lungs every 6 (six) hours as needed for wheezing or shortness of breath. (Patient not taking: Reported on 07/20/2017) 1 Inhaler 0  . Alirocumab (PRALUENT) 150 MG/ML SOPN Inject 1 pen into the skin every 14 (fourteen) days. (Patient taking differently: Inject 1 pen into  the skin every 14 (fourteen) days. On Thursday) 2 pen 11  . ALPRAZolam (XANAX) 1 MG tablet Take 0.5 mg by mouth at bedtime.  1  . amLODipine (NORVASC) 2.5 MG tablet Take 1 tablet (2.5 mg total) by mouth daily. 30 tablet 6  . apixaban (ELIQUIS) 5 MG TABS tablet Take 1 tablet (5 mg total) by mouth 2 (two) times daily. 180 tablet 2  . carvedilol (COREG) 12.5 MG tablet Take 12.5 mg by mouth 2 (two) times daily with a meal.    . CINNAMON PO Take 1,200 mg by mouth 2 (two) times daily.    . Coenzyme Q10 (CO Q 10 PO) Take 1 capsule by mouth daily.    Marland Kitchen ezetimibe (ZETIA) 10 MG tablet Take 1 tablet (10 mg total) by mouth daily. 45 tablet 1  .  levothyroxine (SYNTHROID, LEVOTHROID) 137 MCG tablet Take 137-205.5 mcg by mouth See admin instructions. Take 205.5 mcg by mouth on Wednesday. Take 137 mcg by mouth daily on all other days    . losartan-hydrochlorothiazide (HYZAAR) 100-12.5 MG tablet Take 1 tablet by mouth daily. 90 tablet 3  . Multiple Vitamin (MULTIVITAMIN WITH MINERALS) TABS tablet Take 1 tablet by mouth daily.     . sodium chloride (OCEAN) 0.65 % SOLN nasal spray Place 1 spray into both nostrils at bedtime.     Marland Kitchen testosterone cypionate (DEPOTESTOSTERONE CYPIONATE) 200 MG/ML injection Inject 200 mg into the muscle every 14 (fourteen) days.    . Turmeric 500 MG CAPS Take 500 mg by mouth 2 (two) times daily.     Marland Kitchen venlafaxine XR (EFFEXOR-XR) 150 MG 24 hr capsule Take 150 mg by mouth every morning.      No current facility-administered medications for this visit.     No Known Allergies  Past Medical History:  Diagnosis Date  . Aortic aneurysm ([place]) 02/20/2016   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  . Arthritis   . Atrial fibrillation ([place]) 11/13/2014  . Cancer ([place])    skin  . Chronic combined systolic and diastolic heart failure ([place]) 02/20/2016   LVEF 45-50% 11/2014  . Coronary artery disease   . Depression   . Essential hypertension 11/13/2014  . Hyperlipidemia   . Hyperlipidemia 11/13/2014  . Hypertension   . Sleep apnea    CPAP 'Uses half the time"  last study 2009    Blood pressure 138/80, pulse 68.  Essential hypertension Patient with hypertension, still not well controlled.  Despite both home BP cuffs being inaccurate, they both did note a drop in BP of about 5-10 points within 10 days of starting amlodipine.  For now I will leave his medications unchanged, and he is going to get a new cuff and bring it by the office this week for calibration.  He will then continue with home checks and return in a month for follow up.     Tommy Medal PharmD CPP [facility] [address]  [place], [place] [address] [phone]

## 2017-09-13 ENCOUNTER — Encounter: Payer: Self-pay | Admitting: Pharmacist Clinician (PhC)/ Clinical Pharmacy Specialist

## 2017-09-13 ENCOUNTER — Other Ambulatory Visit: Payer: Self-pay | Admitting: Pharmacist Clinician (PhC)/ Clinical Pharmacy Specialist

## 2017-09-13 MED ORDER — EZETIMIBE 10 MG PO TABS: 10.0000 mg | ORAL_TABLET | Freq: Every day | ORAL | 3 refills | Status: DC

## 2017-09-13 NOTE — Assessment & Plan Note (Signed)
Patient with hypertension, still not well controlled.  Despite both home BP cuffs being inaccurate, they both did note a drop in BP of about 5-10 points within 10 days of starting amlodipine.  For now I will leave his medications unchanged, and he is going to get a new cuff and bring it by the office this week for calibration.  He will then continue with home checks and return in a month for follow up.

## 2017-09-23 DIAGNOSIS — Z6835 Body mass index (BMI) 35.0-35.9, adult: Secondary | ICD-10-CM | POA: Diagnosis not present

## 2017-09-23 DIAGNOSIS — E669 Obesity, unspecified: Secondary | ICD-10-CM | POA: Diagnosis not present

## 2017-09-23 DIAGNOSIS — M2550 Pain in unspecified joint: Secondary | ICD-10-CM | POA: Diagnosis not present

## 2017-09-23 DIAGNOSIS — R5383 Other fatigue: Secondary | ICD-10-CM | POA: Diagnosis not present

## 2017-09-26 DIAGNOSIS — Z23 Encounter for immunization: Secondary | ICD-10-CM | POA: Diagnosis not present

## 2017-09-29 DIAGNOSIS — E1169 Type 2 diabetes mellitus with other specified complication: Secondary | ICD-10-CM | POA: Diagnosis not present

## 2017-09-29 DIAGNOSIS — M3130 Wegener's granulomatosis without renal involvement: Secondary | ICD-10-CM | POA: Diagnosis not present

## 2017-09-29 DIAGNOSIS — E119 Type 2 diabetes mellitus without complications: Secondary | ICD-10-CM | POA: Insufficient documentation

## 2017-10-11 DIAGNOSIS — M3130 Wegener's granulomatosis without renal involvement: Secondary | ICD-10-CM | POA: Diagnosis not present

## 2017-10-13 ENCOUNTER — Encounter: Payer: Self-pay | Admitting: Pharmacist Clinician (PhC)/ Clinical Pharmacy Specialist

## 2017-10-13 ENCOUNTER — Ambulatory Visit (INDEPENDENT_AMBULATORY_CARE_PROVIDER_SITE_OTHER): Payer: Medicare Other | Admitting: Pharmacist Clinician (PhC)/ Clinical Pharmacy Specialist

## 2017-10-13 DIAGNOSIS — E7800 Pure hypercholesterolemia, unspecified: Secondary | ICD-10-CM | POA: Diagnosis not present

## 2017-10-13 DIAGNOSIS — Z6834 Body mass index (BMI) 34.0-34.9, adult: Secondary | ICD-10-CM | POA: Diagnosis not present

## 2017-10-13 DIAGNOSIS — I10 Essential (primary) hypertension: Secondary | ICD-10-CM

## 2017-10-13 DIAGNOSIS — E669 Obesity, unspecified: Secondary | ICD-10-CM | POA: Diagnosis not present

## 2017-10-13 DIAGNOSIS — M3130 Wegener's granulomatosis without renal involvement: Secondary | ICD-10-CM | POA: Diagnosis not present

## 2017-10-13 DIAGNOSIS — Z7952 Long term (current) use of systemic steroids: Secondary | ICD-10-CM | POA: Diagnosis not present

## 2017-10-13 DIAGNOSIS — I6523 Occlusion and stenosis of bilateral carotid arteries: Secondary | ICD-10-CM

## 2017-10-13 NOTE — Patient Instructions (Signed)
Return for a a follow up with Dr. Oval Linsey in November  Your blood pressure today is 122/84  Check your blood pressure at home daily and keep record of the readings.  Take your BP meds as follows:  Continue with all your current medications  Talk to Dr. Amil Amen about restarting Praluent  Call if you have any questions or concerns.  Gary [name]/Raquel at [phone]  Bring all of your meds, your BP cuff and your record of home blood pressures to your next appointment.  Exercise as you're able, try to walk approximately 30 minutes per day.  Keep salt intake to a minimum, especially watch canned and prepared boxed foods.  Eat more fresh fruits and vegetables and fewer canned items.  Avoid eating in fast food restaurants.    HOW TO TAKE YOUR BLOOD PRESSURE: . Rest 5 minutes before taking your blood pressure. .  Don't smoke or drink caffeinated beverages for at least 30 minutes before. . Take your blood pressure before (not after) you eat. . Sit comfortably with your back supported and both feet on the floor (don't cross your legs). . Elevate your arm to heart level on a table or a desk. . Use the proper sized cuff. It should fit smoothly and snugly around your bare upper arm. There should be enough room to slip a fingertip under the cuff. The bottom edge of the cuff should be 1 inch above the crease of the elbow. . Ideally, take 3 measurements at one sitting and record the average.

## 2017-10-13 NOTE — Assessment & Plan Note (Signed)
Patient will speak to rheumatologist for clearance before re-starting his Praluent.  Once he is back on this for 5-6 doses we can repeat labs

## 2017-10-13 NOTE — Progress Notes (Signed)
10/13/2017 Gary [name] 01-01-1946 [id number]   HPI:  Gary [name] is a 72 y.o. male patient of Dr Oval Linsey, with a [place] below who presents today for hypertension clinic evaluation.  In addition to hypertension, his medical history is significant for CAD s/p PCI and CABG, ascending aortic aneurysm, chronic systolic/diastolic heart failure, hyperlipidemia, PAF and OSA (not currently compliant with CPAP).    We have worked over the past several months adjusting medications and he has been doing well with this recently.  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  He started treatments in the past week (Rituxan IV, prednisone, smx/tmp ss) and other than some insomnia, is doing well.    Did not bring his home BP readings today, but he notes that until about 4-5 days ago, he was in the 140-150's.  These past few days it has dropped more into the 716'R systolic.  In looking at side effects of the Ritulxan, there is about a 10% incidence of hypotension.  Hopefully this is the cause of his BP drop, as we would expect an increased BP with prednisone.    Blood Pressure Goal:  130/80  Current Medications:  Carvedilol 12.5 mg bid  Losartan hctz 100/12.5 mg qd - pm  Amlodipine 2.5 mg qd  Family Hx:  Both parents had hypertension,  Mother had CHF (several siblings with MI and/or stroke)  Father had stroke, MI in his 16's (was 46 of 95, 12 with MI's)  2 brothers, 1 died at 54 with MI, other 73 now with CABG  2 sons - neither has been to MD in years.     Social Hx:  No tobacco, rare alcohol, no soda, 1-2 coffee per day  Diet:  Eats out regularly, avoids fast food; does add some salt ) to only few foods (eggs and potatoes), has cut way back in past couple of years.   Exercise:  Not currently, having problems with legs being tired, arthritis in knees  Home BP readings:  Has 3 cuffs, 2 wrist, 1 arm;  He brought arm and 1 wrist cuff in today.  Unfortunately, when  compared to the office readings, his arm cuff (avg 164/93) read 20/10 points high, while the wrist cuff (avg 127/71) read 20/8 points low.   So unfortunately, we don't have any good home readings on which to base decisions.    Intolerances:   knda  Labs:  07/27/17:  Na 141, K 4.5, Glu 179, BUN 13, SCr 0.73  (CrCl 138.4) Wt Readings from Last 3 Encounters:  07/20/17 243 lb (110.2 kg)  05/26/17 242 lb 3.2 oz (109.9 kg)  05/19/17 242 lb 3.2 oz (109.9 kg)   BP Readings from Last 3 Encounters:  10/13/17 122/84  09/12/17 138/80  08/22/17 (!) 152/88   Pulse Readings from Last 3 Encounters:  10/13/17 70  09/12/17 68  08/22/17 66    Current Outpatient Medications  Medication Sig Dispense Refill  . riTUXimab (RITUXAN IV) Inject into the vein.    Marland Kitchen sulfamethoxazole-trimethoprim (BACTRIM,SEPTRA) 400-80 MG tablet Take 1 tablet by mouth daily.    Marland Kitchen albuterol (PROVENTIL HFA;VENTOLIN HFA) 108 (90 Base) MCG/ACT inhaler Inhale 2 puffs into the lungs every 6 (six) hours as needed for wheezing or shortness of breath. (Patient not taking: Reported on 07/20/2017) 1 Inhaler 0  . Alirocumab (PRALUENT) 150 MG/ML SOPN Inject 1 pen into the skin every 14 (fourteen) days. (Patient taking differently: Inject 1 pen into the skin every  14 (fourteen) days. On Thursday) 2 pen 11  . ALPRAZolam (XANAX) 1 MG tablet Take 0.5 mg by mouth at bedtime.  1  . amLODipine (NORVASC) 2.5 MG tablet Take 1 tablet (2.5 mg total) by mouth daily. 30 tablet 6  . carvedilol (COREG) 12.5 MG tablet Take 12.5 mg by mouth 2 (two) times daily with a meal.    . CINNAMON PO Take 1,200 mg by mouth 2 (two) times daily.    . Coenzyme Q10 (CO Q 10 PO) Take 1 capsule by mouth daily.    Marland Kitchen ELIQUIS 5 MG TABS tablet TAKE 1 TABLET BY MOUTH TWICE A DAY 180 tablet 1  . ezetimibe (ZETIA) 10 MG tablet Take 1 tablet (10 mg total) by mouth daily. 90 tablet 3  . levothyroxine (SYNTHROID, LEVOTHROID) 137 MCG tablet Take 137-205.5 mcg by mouth See admin  instructions. Take 205.5 mcg by mouth on Wednesday. Take 137 mcg by mouth daily on all other days    . losartan-hydrochlorothiazide (HYZAAR) 100-12.5 MG tablet Take 1 tablet by mouth daily. 90 tablet 3  . Multiple Vitamin (MULTIVITAMIN WITH MINERALS) TABS tablet Take 1 tablet by mouth daily.     . sodium chloride (OCEAN) 0.65 % SOLN nasal spray Place 1 spray into both nostrils at bedtime.     Marland Kitchen testosterone cypionate (DEPOTESTOSTERONE CYPIONATE) 200 MG/ML injection Inject 200 mg into the muscle every 14 (fourteen) days.    . Turmeric 500 MG CAPS Take 500 mg by mouth 2 (two) times daily.     Marland Kitchen venlafaxine XR (EFFEXOR-XR) 150 MG 24 hr capsule Take 150 mg by mouth every morning.      No current facility-administered medications for this visit.     No Known Allergies  Past Medical History:  Diagnosis Date  . Aortic aneurysm ([place]) 02/20/2016   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  . Arthritis   . Atrial fibrillation ([place]) 11/13/2014  . Cancer ([place])    skin  . Chronic combined systolic and diastolic heart failure ([place]) 02/20/2016   LVEF 45-50% 11/2014  . Coronary artery disease   . Depression   . Essential hypertension 11/13/2014  . Hyperlipidemia   . Hyperlipidemia 11/13/2014  . Hypertension   . Sleep apnea    CPAP 'Uses half the time"  last study 2009    Blood pressure 122/84, pulse 70.  Essential hypertension Patient with hypertension, now somewhat improved at home.  He has recently started Rituxan, prednisone and SMX/TMP SS.  I suspect that some of the recent drop in home BP readings is related to his Rituxan.  He is to continue with his current medications and continue monitoring home BP readings.    Hyperlipidemia Patient will speak to rheumatologist for clearance before re-starting his Praluent.  Once he is back on this for 5-6 doses we can repeat labs   Tommy Medal PharmD CPP [facility] [address] [place], [place]  [address] [phone]

## 2017-10-13 NOTE — Assessment & Plan Note (Signed)
Patient with hypertension, now somewhat improved at home.  He has recently started Rituxan, prednisone and SMX/TMP SS.  I suspect that some of the recent drop in home BP readings is related to his Rituxan.  He is to continue with his current medications and continue monitoring home BP readings.

## 2017-10-18 DIAGNOSIS — M3130 Wegener's granulomatosis without renal involvement: Secondary | ICD-10-CM | POA: Diagnosis not present

## 2017-10-20 ENCOUNTER — Telehealth: Payer: Self-pay | Admitting: *Deleted

## 2017-10-20 MED ORDER — LOSARTAN POTASSIUM 100 MG PO TABS: 100.0000 mg | ORAL_TABLET | Freq: Every day | ORAL | 3 refills | Status: DC

## 2017-10-20 MED ORDER — HYDROCHLOROTHIAZIDE 12.5 MG PO TABS: 12.5000 mg | ORAL_TABLET | Freq: Every day | ORAL | 3 refills | Status: DC

## 2017-10-20 NOTE — Telephone Encounter (Signed)
Received fax from [facility] that patients Losartan HCTZ 100-12.5 mg on back order. Will Rx Losartan 100 mg and HCTZ 12.5 mg in place for now Left message to call back

## 2017-10-21 NOTE — Telephone Encounter (Signed)
Follow Up: ° ° ° °Returning your call from yesterday. °

## 2017-10-21 NOTE — Telephone Encounter (Signed)
Advised patient, verbalized understanding  

## 2017-10-25 DIAGNOSIS — M3130 Wegener's granulomatosis without renal involvement: Secondary | ICD-10-CM | POA: Diagnosis not present

## 2017-10-28 ENCOUNTER — Telehealth: Payer: Self-pay | Admitting: Pharmacist

## 2017-10-28 DIAGNOSIS — E7800 Pure hypercholesterolemia, unspecified: Secondary | ICD-10-CM

## 2017-10-28 NOTE — Telephone Encounter (Signed)
PASS form for 2020 mailed to patient for completion.  Completed form and labs needed ASAP for renewal

## 2017-11-01 DIAGNOSIS — M3130 Wegener's granulomatosis without renal involvement: Secondary | ICD-10-CM | POA: Diagnosis not present

## 2017-11-09 DIAGNOSIS — H6983 Other specified disorders of Eustachian tube, bilateral: Secondary | ICD-10-CM | POA: Diagnosis not present

## 2017-11-09 DIAGNOSIS — J342 Deviated nasal septum: Secondary | ICD-10-CM | POA: Diagnosis not present

## 2017-11-09 DIAGNOSIS — G4733 Obstructive sleep apnea (adult) (pediatric): Secondary | ICD-10-CM | POA: Diagnosis not present

## 2017-11-09 DIAGNOSIS — M3130 Wegener's granulomatosis without renal involvement: Secondary | ICD-10-CM | POA: Diagnosis not present

## 2017-11-09 DIAGNOSIS — J3489 Other specified disorders of nose and nasal sinuses: Secondary | ICD-10-CM | POA: Diagnosis not present

## 2017-11-09 DIAGNOSIS — H6121 Impacted cerumen, right ear: Secondary | ICD-10-CM | POA: Diagnosis not present

## 2017-11-09 DIAGNOSIS — R040 Epistaxis: Secondary | ICD-10-CM | POA: Diagnosis not present

## 2017-11-09 DIAGNOSIS — Z87891 Personal history of nicotine dependence: Secondary | ICD-10-CM | POA: Diagnosis not present

## 2017-11-09 DIAGNOSIS — Z9989 Dependence on other enabling machines and devices: Secondary | ICD-10-CM | POA: Diagnosis not present

## 2017-11-09 DIAGNOSIS — Z7289 Other problems related to lifestyle: Secondary | ICD-10-CM | POA: Diagnosis not present

## 2017-11-15 ENCOUNTER — Encounter: Payer: Self-pay | Admitting: Cardiovascular Disease

## 2017-11-15 ENCOUNTER — Ambulatory Visit (INDEPENDENT_AMBULATORY_CARE_PROVIDER_SITE_OTHER): Payer: Medicare Other | Admitting: Cardiovascular Disease

## 2017-11-15 VITALS — BP 108/80 | HR 87 | Ht 70.0 in | Wt 239.8 lb

## 2017-11-15 DIAGNOSIS — I6523 Occlusion and stenosis of bilateral carotid arteries: Secondary | ICD-10-CM

## 2017-11-15 DIAGNOSIS — Z951 Presence of aortocoronary bypass graft: Secondary | ICD-10-CM | POA: Diagnosis not present

## 2017-11-15 DIAGNOSIS — Z5181 Encounter for therapeutic drug level monitoring: Secondary | ICD-10-CM | POA: Diagnosis not present

## 2017-11-15 DIAGNOSIS — I4819 Other persistent atrial fibrillation: Secondary | ICD-10-CM | POA: Diagnosis not present

## 2017-11-15 DIAGNOSIS — E7800 Pure hypercholesterolemia, unspecified: Secondary | ICD-10-CM

## 2017-11-15 DIAGNOSIS — I10 Essential (primary) hypertension: Secondary | ICD-10-CM | POA: Diagnosis not present

## 2017-11-15 NOTE — Patient Instructions (Signed)
Medication Instructions:  Your physician recommends that you continue on your current medications as directed. Please refer to the Current Medication list given to you today.  If you need a refill on your cardiac medications before your next appointment, please call your pharmacy.   Lab work: FASTING LP/CMET SOON   If you have labs (blood work) drawn today and your tests are completely normal, you will receive your results only by: . MyChart Message (if you have MyChart) OR . A paper copy in the mail If you have any lab test that is abnormal or we need to change your treatment, we will call you to review the results.  Testing/Procedures: NONE  Follow-Up: At [facility], you and your health needs are our priority.  As part of our continuing mission to provide you with exceptional heart care, we have created designated Provider Care Teams.  These Care Teams include your primary Cardiologist (physician) and Advanced Practice Providers (APPs -  Physician Assistants and Nurse Practitioners) who all work together to provide you with the care you need, when you need it. You will need a follow up appointment in 6 months.  Please call our office 2 months in advance to schedule this appointment.  You may see Tiffany Maurice, MD  or one of the following Advanced Practice Providers on your designated Care Team:   Luke Kilroy, PA-C Krista Kroeger, PA-C . Callie Goodrich, PA-C      

## 2017-11-15 NOTE — Progress Notes (Signed)
Cardiology Office Note   Date:  11/15/2017   ID:  Gary [name], DOB [birth year], MRN [id number]  PCP:  [name], MD  Cardiologist:   [name], MD   Chief Complaint  Patient presents with  . Follow-up    Patient ID: Gary [name] is a 72 y.o. male with hypertension, CAD s/p PCI and CABG, mild ascending aortic aneurysm, chronic systolic and diastolic heart failure, hyperlipidemia, paroxysmal atrial fibrillation, and OSA non-compliant with CPAP who presents for follow up.  Mr. Gary [name] was first seen 11/2014 after being diagnosed with atrial fibrillation.  He felt tired and underwent successful DCCV on 12/2014.  He had an echo 11/2014 that revealed LVEF 45-50% with inferolateral hypokinesis.  He also was noted to have an ascending aorta aneurysm (4.0cm).  Repeat echo 03/2016 was unchanged. He then had a Lexiscan Myoview 11/27/14 that revealed a small, fixed inferolateral defect.  He was noted to have a carotid bruit so he was referred for carotid Doppler 02/2015 that revealed 1-39% ICA stenosis bilaterally.    Mr. Gary [name] blood pressure has been elevated. Losartan was increased to 100 mg and carvedilol to 25 mg.  His blood pressure remained elevated so HCTZ was added to his regimen.  He followed up with our pharmacist and his BP was unchanged so amlodipine 2.5 mg was added.  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  He has finished 4 7 infusions of rituximab.  He has been feeling well.  His main complaint is bad sinus congestion.  His blood pressure at home has been running in the 120s to 130s before he takes his medications in the mornings.  He has no chest pain or shortness of breath.  He denies lower extremity edema.  He also has no orthopnea or PND.  He has been more mindful about limiting his carbohydrate intake.  He notes that his legs have been wobbly but he denies syncope or falls.    Past Medical History:  Diagnosis Date  . Aortic  aneurysm ([place]) 02/20/2016   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  . Arthritis   . Atrial fibrillation ([place]) 11/13/2014  . Cancer ([place])    skin  . Chronic combined systolic and diastolic heart failure ([place]) 02/20/2016   LVEF 45-50% 11/2014  . Coronary artery disease   . Depression   . Essential hypertension 11/13/2014  . Hyperlipidemia   . Hyperlipidemia 11/13/2014  . Hypertension   . Sleep apnea    CPAP 'Uses half the time"  last study 2009    Past Surgical History:  Procedure Laterality Date  . CARDIOVERSION N/A 12/06/2014   Procedure: CARDIOVERSION;  Surgeon: [name], MD;  Location: [facility];  Service: Cardiovascular;  Laterality: N/A;  . CARDIOVERSION N/A 05/26/2017   Procedure: CARDIOVERSION;  Surgeon: [name], MD;  Location: [facility];  Service: Cardiovascular;  Laterality: N/A;  . CARPAL TUNNEL RELEASE    . CORONARY ANGIOPLASTY     multiple stents  . CORONARY ARTERY BYPASS GRAFT  2006  . EYE SURGERY     right eye-  muscular repair  . TOTAL HIP ARTHROPLASTY  01/03/2012   Procedure: TOTAL HIP ARTHROPLASTY;  Surgeon: [name], MD;  Location: [facility];  Service: Orthopedics;  Laterality: Right;     Current Outpatient Medications  Medication Sig Dispense Refill  . Alirocumab (PRALUENT) 150 MG/ML SOPN Inject 1 pen into the skin every 14 (fourteen) days. (Patient taking differently: Inject 1 pen into the skin  every 14 (fourteen) days. On Thursday) 2 pen 11  . ALPRAZolam (XANAX) 1 MG tablet Take 0.5 mg by mouth at bedtime.  1  . amLODipine (NORVASC) 2.5 MG tablet Take 1 tablet (2.5 mg total) by mouth daily. 30 tablet 6  . carvedilol (COREG) 25 MG tablet Take 25 mg by mouth 2 (two) times daily.  3  . Coenzyme Q10 (CO Q 10 PO) Take 1 capsule by mouth daily.    Gary [name] ELIQUIS 5 MG TABS tablet TAKE 1 TABLET BY MOUTH TWICE A DAY 180 tablet 1  . ezetimibe (ZETIA) 10 MG tablet Take 5 mg by mouth daily. 1/2 tablet daily    . hydrochlorothiazide (HYDRODIURIL) 12.5 MG  tablet Take 1 tablet (12.5 mg total) by mouth daily. 90 tablet 3  . levothyroxine (SYNTHROID, LEVOTHROID) 137 MCG tablet Take 137-205.5 mcg by mouth See admin instructions. Take 205.5 mcg by mouth on Wednesday. Take 137 mcg by mouth daily on all other days    . losartan (COZAAR) 100 MG tablet Take 1 tablet (100 mg total) by mouth daily. 90 tablet 3  . metFORMIN (GLUCOPHAGE) 500 MG tablet Take 500 mg by mouth 2 (two) times daily.  12  . Multiple Vitamin (MULTIVITAMIN WITH MINERALS) TABS tablet Take 1 tablet by mouth daily.     . riTUXimab (RITUXAN IV) Inject into the vein.    . sodium chloride (OCEAN) 0.65 % SOLN nasal spray Place 1 spray into both nostrils at bedtime.     . sulfamethoxazole-trimethoprim (BACTRIM,SEPTRA) 400-80 MG tablet Take 1 tablet by mouth daily.    Gary [name] testosterone cypionate (DEPOTESTOSTERONE CYPIONATE) 200 MG/ML injection Inject 200 mg into the muscle every 14 (fourteen) days.    . Turmeric 500 MG CAPS Take 500 mg by mouth 2 (two) times daily.     Gary [name] venlafaxine XR (EFFEXOR-XR) 150 MG 24 hr capsule Take 150 mg by mouth every morning.      No current facility-administered medications for this visit.     Allergies:   Patient has no known allergies.    Social History:  The patient  reports that he quit smoking about 33 years ago. His smoking use included cigarettes. He has never used smokeless tobacco. He reports that he does not drink alcohol or use drugs.   Family History:  The patient's family history includes Heart attack in his maternal grandfather, mother, paternal grandfather, and paternal grandmother; Other in his father; Stroke in his maternal grandmother.    ROS:  Please see the history of present illness.   Otherwise, review of systems are positive for none.   All other systems are reviewed and negative.    PHYSICAL EXAM: VS:  BP 108/80   Pulse 87   Ht 5\' 10"  (1.778 m)   Wt 239 lb 12.8 oz (108.8 kg)   BMI 34.41 kg/m  , BMI Body mass index is 34.41  kg/m. GENERAL:  Well appearing HEENT: Pupils equal round and reactive, fundi not visualized, oral mucosa unremarkable NECK:  No jugular venous distention, waveform within normal limits, carotid upstroke brisk and symmetric, no bruits LUNGS:  Clear to auscultation bilaterally HEART:  RRR.  PMI not displaced or sustained,S1 and S2 within normal limits, no S3, no S4, no clicks, no rubs, no murmurs ABD:  Flat, positive bowel sounds normal in frequency in pitch, no bruits, no rebound, no guarding, no midline pulsatile mass, no hepatomegaly, no splenomegaly EXT:  2 plus pulses throughout, no edema, no cyanosis no clubbing SKIN:  No  rashes no nodules NEURO:  Cranial nerves II through XII grossly intact, motor grossly intact throughout PSYCH:  Cognitively intact, oriented to person place and time   EKG:  EKG is not ordered today. 02/20/16: Sinus rhythm. Rate 66 bpm. Left ventricular hypertrophy with repolarization abnormality. 08/30/16:  Sinus rhythm. Rate 66 bpm. LAFB. Poor R wave progression. 04/12/17: Sinus rhythm.  Rate 65 bpm.  Prior anteroseptal infarct.  Nonspecific ST changes.     Echo 11/20/14: Study Conclusions  - Left ventricle: The cavity size was normal. Wall thickness was   increased in a pattern of moderate LVH. Systolic function was   mildly reduced. The estimated ejection fraction was in the range   of 45% to 50%. Inferiorlateral hypokinesis. Abnormal GLPSS at   -11%, with inferolateral strain abnormality. The study is not   technically sufficient to allow evaluation of LV diastolic   function. - Aorta: Ascending aortic diameter: 40 mm (S). - Ascending aorta: The ascending aorta was mildly dilated. - Left atrium: The atrium was normal in size. - Right atrium: Severely dilated at 30 cm2. - Systemic veins: The IVC measures <2.1 cm, but does not collapse   >50%, suggesting an elevated RA pressure of 8 mmHg.  Lexiscan Myoview 11/27/14:  There was no ST segment deviation noted  during stress.  No T wave inversion was noted during stress.  Defect 1: There is a small defect of moderate severity.  Small size, moderate-intensity fixed inferolateral wall defect - could represent artifact or scar. The study was non-gated, therefore LVEF was not calculated and wall motion abnormalities could not be determined. Based on the lack of significant reversible ischemia, this is a low risk study.  Carotid Doppler 02/19/15: 1-39% ICA stenosis bilaterally  Recent Labs: 05/03/2017: B Natriuretic Peptide 350.9 05/04/2017: ALT 23; Magnesium 1.8 05/19/2017: Hemoglobin 14.1; Platelets 433 07/27/2017: BUN 13; Creatinine, Ser 0.73; Potassium 4.5; Sodium 141    Lipid Panel    Component Value Date/Time   CHOL 193 02/19/2015 0821   TRIG 110 02/19/2015 0821   HDL 36 (L) 02/19/2015 0821   CHOLHDL 5.4 (H) 02/19/2015 0821   VLDL 22 02/19/2015 0821   LDLCALC 135 (H) 02/19/2015 1856     07/21/16: Total cholesterol 105, triglycerides 94, HDL 36, LDL 49  Wt Readings from Last 3 Encounters:  11/15/17 239 lb 12.8 oz (108.8 kg)  07/20/17 243 lb (110.2 kg)  05/26/17 242 lb 3.2 oz (109.9 kg)      ASSESSMENT AND PLAN:  # Atrial fibrillation: No recurrent episodes.  Continue carvedilol and Eliquis.  This patients CHA2DS2-VASc Score and unadjusted Ischemic Stroke Rate (% per year) is equal to 3.2 % stroke rate/year from a score of 3  Above score calculated as 1 point each if present [CHF, HTN, DM, Vascular=MI/PAD/Aortic Plaque, Age if 57-74, or Male.  Above score calculated as 2 points each if present [Age > 75, or Stroke/TIA/TE]  # CAD s/p CABG and PCI: 11/2014 [name] has not experienced any chest pain recently.  Cardiolite  showed a fixed defect, either infarct or artifact but no ischemia.  Continue losartan and carvedilol.  No aspirin on Eliquis.    # Carotid stenosis:  Mild carotid stenosis noted on carotid Dopplers 02/2015 and unchanged 04/2017.  # Hypertension:  BP better  controlled.  Continue amlodipine, carvedilol, HCTZ, and losartan.  # Hyperlipidemia: Check fasting lipids.  Continue Praluent.  # Aortic aneurysm: Ascending aorta was 4.0 cm 11/2013 and 03/2016.  The patient does not have  concerns regarding medicines.  The following changes have been made:  no change  Labs/ tests ordered today include:    Disposition:   FU with Theoden Mauch C. Oval Linsey, MD in  6 months.  PharmD in one month.    Signed, [name], MD  11/15/2017 1:28 PM    [facility]

## 2017-11-18 ENCOUNTER — Encounter: Payer: Self-pay | Admitting: Cardiovascular Disease

## 2017-11-18 DIAGNOSIS — M3130 Wegener's granulomatosis without renal involvement: Secondary | ICD-10-CM

## 2017-11-18 HISTORY — DX: Wegener's granulomatosis without renal involvement: M31.30

## 2017-11-22 DIAGNOSIS — M3130 Wegener's granulomatosis without renal involvement: Secondary | ICD-10-CM | POA: Diagnosis not present

## 2017-11-22 DIAGNOSIS — Z6834 Body mass index (BMI) 34.0-34.9, adult: Secondary | ICD-10-CM | POA: Diagnosis not present

## 2017-11-22 DIAGNOSIS — E669 Obesity, unspecified: Secondary | ICD-10-CM | POA: Diagnosis not present

## 2017-11-22 DIAGNOSIS — Z7952 Long term (current) use of systemic steroids: Secondary | ICD-10-CM | POA: Diagnosis not present

## 2017-11-22 DIAGNOSIS — M6281 Muscle weakness (generalized): Secondary | ICD-10-CM | POA: Diagnosis not present

## 2017-11-28 ENCOUNTER — Other Ambulatory Visit: Payer: Self-pay

## 2017-11-28 MED ORDER — HYDROCHLOROTHIAZIDE 12.5 MG PO TABS: 12.5000 mg | ORAL_TABLET | Freq: Every day | ORAL | 3 refills | Status: DC

## 2017-11-28 MED ORDER — LOSARTAN POTASSIUM 100 MG PO TABS: 100.0000 mg | ORAL_TABLET | Freq: Every day | ORAL | 3 refills | Status: DC

## 2017-12-12 DIAGNOSIS — Z5181 Encounter for therapeutic drug level monitoring: Secondary | ICD-10-CM | POA: Diagnosis not present

## 2017-12-12 DIAGNOSIS — E7800 Pure hypercholesterolemia, unspecified: Secondary | ICD-10-CM | POA: Diagnosis not present

## 2017-12-12 DIAGNOSIS — I10 Essential (primary) hypertension: Secondary | ICD-10-CM | POA: Diagnosis not present

## 2017-12-12 LAB — COMPREHENSIVE METABOLIC PANEL
A/G RATIO: 1.9 (ref 1.2–2.2)
ALK PHOS: 67 IU/L (ref 39–117)
ALT: 26 IU/L (ref 0–44)
AST: 18 IU/L (ref 0–40)
Albumin: 3.8 g/dL (ref 3.5–4.8)
BUN/Creatinine Ratio: 26 — ABNORMAL HIGH (ref 10–24)
BUN: 22 mg/dL (ref 8–27)
Bilirubin Total: 0.4 mg/dL (ref 0.0–1.2)
CALCIUM: 9.1 mg/dL (ref 8.6–10.2)
CHLORIDE: 99 mmol/L (ref 96–106)
CO2: 24 mmol/L (ref 20–29)
CREATININE: 0.85 mg/dL (ref 0.76–1.27)
GFR calc Af Amer: 99 mL/min/{1.73_m2} (ref >59)
GFR calc non Af Amer: 86 mL/min/{1.73_m2} (ref >59)
Globulin, Total: 2 g/dL (ref 1.5–4.5)
Glucose: 137 mg/dL — ABNORMAL HIGH (ref 65–99)
Potassium: 3.9 mmol/L (ref 3.5–5.2)
Sodium: 142 mmol/L (ref 134–144)
Total Protein: 5.8 g/dL — ABNORMAL LOW (ref 6.0–8.5)

## 2017-12-12 LAB — LIPID PANEL
CHOL/HDL RATIO: 2.3 ratio (ref 0.0–5.0)
Cholesterol, Total: 134 mg/dL (ref 100–199)
HDL: 58 mg/dL (ref >39)
LDL CALC: 39 mg/dL (ref 0–99)
Triglycerides: 185 mg/dL — ABNORMAL HIGH (ref 0–149)
VLDL Cholesterol Cal: 37 mg/dL (ref 5–40)

## 2017-12-16 DIAGNOSIS — H5212 Myopia, left eye: Secondary | ICD-10-CM | POA: Diagnosis not present

## 2017-12-16 DIAGNOSIS — H26101 Unspecified traumatic cataract, right eye: Secondary | ICD-10-CM | POA: Diagnosis not present

## 2017-12-16 DIAGNOSIS — H2512 Age-related nuclear cataract, left eye: Secondary | ICD-10-CM | POA: Diagnosis not present

## 2017-12-16 DIAGNOSIS — H40033 Anatomical narrow angle, bilateral: Secondary | ICD-10-CM | POA: Diagnosis not present

## 2017-12-26 DIAGNOSIS — E7800 Pure hypercholesterolemia, unspecified: Secondary | ICD-10-CM | POA: Diagnosis not present

## 2017-12-26 DIAGNOSIS — E291 Testicular hypofunction: Secondary | ICD-10-CM | POA: Diagnosis not present

## 2017-12-26 DIAGNOSIS — E039 Hypothyroidism, unspecified: Secondary | ICD-10-CM | POA: Diagnosis not present

## 2017-12-26 DIAGNOSIS — F324 Major depressive disorder, single episode, in partial remission: Secondary | ICD-10-CM | POA: Diagnosis not present

## 2017-12-26 DIAGNOSIS — M79604 Pain in right leg: Secondary | ICD-10-CM | POA: Diagnosis not present

## 2017-12-26 DIAGNOSIS — I4891 Unspecified atrial fibrillation: Secondary | ICD-10-CM | POA: Diagnosis not present

## 2017-12-26 DIAGNOSIS — I25810 Atherosclerosis of coronary artery bypass graft(s) without angina pectoris: Secondary | ICD-10-CM | POA: Diagnosis not present

## 2017-12-26 DIAGNOSIS — G4733 Obstructive sleep apnea (adult) (pediatric): Secondary | ICD-10-CM | POA: Diagnosis not present

## 2017-12-26 DIAGNOSIS — I10 Essential (primary) hypertension: Secondary | ICD-10-CM | POA: Diagnosis not present

## 2017-12-26 DIAGNOSIS — Z79899 Other long term (current) drug therapy: Secondary | ICD-10-CM | POA: Diagnosis not present

## 2017-12-26 DIAGNOSIS — E1169 Type 2 diabetes mellitus with other specified complication: Secondary | ICD-10-CM | POA: Diagnosis not present

## 2017-12-26 DIAGNOSIS — G629 Polyneuropathy, unspecified: Secondary | ICD-10-CM | POA: Diagnosis not present

## 2017-12-26 DIAGNOSIS — Z7984 Long term (current) use of oral hypoglycemic drugs: Secondary | ICD-10-CM | POA: Diagnosis not present

## 2018-01-18 ENCOUNTER — Telehealth: Payer: Self-pay | Admitting: Cardiovascular Disease

## 2018-01-18 NOTE — Telephone Encounter (Signed)
error 

## 2018-01-19 ENCOUNTER — Other Ambulatory Visit: Payer: Self-pay | Admitting: *Deleted

## 2018-01-19 MED ORDER — HYDROCHLOROTHIAZIDE 12.5 MG PO TABS: 12.5000 mg | ORAL_TABLET | Freq: Every day | ORAL | 3 refills | Status: DC

## 2018-01-19 MED ORDER — LOSARTAN POTASSIUM 100 MG PO TABS: 100.0000 mg | ORAL_TABLET | Freq: Every day | ORAL | 3 refills | Status: DC

## 2018-01-23 ENCOUNTER — Telehealth: Payer: Self-pay | Admitting: Cardiovascular Disease

## 2018-01-23 NOTE — Telephone Encounter (Signed)
Returned call to patient he stated he needed new registration sent in to receive Praluent.Stated he has been taking prednisone his cholesterol may be elevated he wanted pharmacy to be aware.Advised I will send message to pharmacy.

## 2018-01-23 NOTE — Telephone Encounter (Signed)
New message    Pt stated that he's having a medication issue and not telling me anything else.

## 2018-01-23 NOTE — Telephone Encounter (Signed)
Called pt to let them know that we are still missing PASS paperwork and I will put up front with check in. Pt states that he on high doses of prednisone and stated that he has one shot left advised pt that we will do pa asap for praluent and try to expedite the process and that if it gets closer to date of next dose to call us back and try to see if we have samples left.

## 2018-01-25 ENCOUNTER — Telehealth: Payer: Self-pay

## 2018-01-27 NOTE — Telephone Encounter (Signed)
Called pt to discuss insurance info for cholesterol med

## 2018-01-30 ENCOUNTER — Other Ambulatory Visit: Payer: Self-pay

## 2018-01-30 DIAGNOSIS — M6281 Muscle weakness (generalized): Secondary | ICD-10-CM | POA: Diagnosis not present

## 2018-01-30 DIAGNOSIS — M3130 Wegener's granulomatosis without renal involvement: Secondary | ICD-10-CM | POA: Diagnosis not present

## 2018-01-30 DIAGNOSIS — E669 Obesity, unspecified: Secondary | ICD-10-CM | POA: Diagnosis not present

## 2018-01-30 DIAGNOSIS — Z7952 Long term (current) use of systemic steroids: Secondary | ICD-10-CM | POA: Diagnosis not present

## 2018-01-30 DIAGNOSIS — Z6835 Body mass index (BMI) 35.0-35.9, adult: Secondary | ICD-10-CM | POA: Diagnosis not present

## 2018-01-30 MED ORDER — ALIROCUMAB 150 MG/ML ~~LOC~~ SOAJ: 150.0000 mg | SUBCUTANEOUS | 6 refills | Status: DC

## 2018-02-07 ENCOUNTER — Telehealth: Payer: Self-pay

## 2018-02-07 NOTE — Telephone Encounter (Signed)
Called pt to let them know that PASS needs them to call them with the [employer] card info

## 2018-02-13 ENCOUNTER — Other Ambulatory Visit: Payer: Self-pay | Admitting: Cardiovascular Disease

## 2018-02-17 ENCOUNTER — Telehealth: Payer: Self-pay | Admitting: Pharmacist

## 2018-02-17 NOTE — Telephone Encounter (Signed)
Received call from pharmacy to verify [place] for Gary [name].  Order for Praluent 150mg  [place] every 14 days verified

## 2018-03-08 DIAGNOSIS — H40033 Anatomical narrow angle, bilateral: Secondary | ICD-10-CM | POA: Diagnosis not present

## 2018-03-10 DIAGNOSIS — H1820 Unspecified corneal edema: Secondary | ICD-10-CM | POA: Diagnosis not present

## 2018-03-10 DIAGNOSIS — H40033 Anatomical narrow angle, bilateral: Secondary | ICD-10-CM | POA: Diagnosis not present

## 2018-03-22 DIAGNOSIS — H1820 Unspecified corneal edema: Secondary | ICD-10-CM | POA: Diagnosis not present

## 2018-03-31 DIAGNOSIS — H1820 Unspecified corneal edema: Secondary | ICD-10-CM | POA: Diagnosis not present

## 2018-04-10 DIAGNOSIS — M3130 Wegener's granulomatosis without renal involvement: Secondary | ICD-10-CM | POA: Diagnosis not present

## 2018-04-10 DIAGNOSIS — Z7952 Long term (current) use of systemic steroids: Secondary | ICD-10-CM | POA: Diagnosis not present

## 2018-04-13 ENCOUNTER — Other Ambulatory Visit: Payer: Self-pay | Admitting: Cardiovascular Disease

## 2018-04-13 DIAGNOSIS — H1820 Unspecified corneal edema: Secondary | ICD-10-CM | POA: Diagnosis not present

## 2018-04-13 NOTE — Telephone Encounter (Signed)
Last OV with [facility] 11/2017  Wt 108kg Scr = 0.85 on 12/2017 73yo

## 2018-04-24 DIAGNOSIS — M3130 Wegener's granulomatosis without renal involvement: Secondary | ICD-10-CM | POA: Diagnosis not present

## 2018-04-26 DIAGNOSIS — H1820 Unspecified corneal edema: Secondary | ICD-10-CM | POA: Diagnosis not present

## 2018-05-02 ENCOUNTER — Telehealth: Payer: Self-pay | Admitting: *Deleted

## 2018-05-02 NOTE — Telephone Encounter (Signed)
05/02/18  LM with wife to call our office @ 11:51 am.

## 2018-05-18 DIAGNOSIS — F324 Major depressive disorder, single episode, in partial remission: Secondary | ICD-10-CM | POA: Diagnosis not present

## 2018-05-18 DIAGNOSIS — Z7984 Long term (current) use of oral hypoglycemic drugs: Secondary | ICD-10-CM | POA: Diagnosis not present

## 2018-05-18 DIAGNOSIS — E1169 Type 2 diabetes mellitus with other specified complication: Secondary | ICD-10-CM | POA: Diagnosis not present

## 2018-05-18 DIAGNOSIS — Z0000 Encounter for general adult medical examination without abnormal findings: Secondary | ICD-10-CM | POA: Diagnosis not present

## 2018-05-18 DIAGNOSIS — E7800 Pure hypercholesterolemia, unspecified: Secondary | ICD-10-CM | POA: Diagnosis not present

## 2018-05-18 DIAGNOSIS — I4891 Unspecified atrial fibrillation: Secondary | ICD-10-CM | POA: Diagnosis not present

## 2018-05-18 DIAGNOSIS — I25810 Atherosclerosis of coronary artery bypass graft(s) without angina pectoris: Secondary | ICD-10-CM | POA: Diagnosis not present

## 2018-05-18 DIAGNOSIS — E291 Testicular hypofunction: Secondary | ICD-10-CM | POA: Diagnosis not present

## 2018-05-18 DIAGNOSIS — I10 Essential (primary) hypertension: Secondary | ICD-10-CM | POA: Diagnosis not present

## 2018-05-18 DIAGNOSIS — N400 Enlarged prostate without lower urinary tract symptoms: Secondary | ICD-10-CM | POA: Diagnosis not present

## 2018-05-18 DIAGNOSIS — E039 Hypothyroidism, unspecified: Secondary | ICD-10-CM | POA: Diagnosis not present

## 2018-05-18 DIAGNOSIS — G629 Polyneuropathy, unspecified: Secondary | ICD-10-CM | POA: Diagnosis not present

## 2018-05-31 DIAGNOSIS — M6281 Muscle weakness (generalized): Secondary | ICD-10-CM | POA: Diagnosis not present

## 2018-05-31 DIAGNOSIS — Z7952 Long term (current) use of systemic steroids: Secondary | ICD-10-CM | POA: Diagnosis not present

## 2018-05-31 DIAGNOSIS — Z6835 Body mass index (BMI) 35.0-35.9, adult: Secondary | ICD-10-CM | POA: Diagnosis not present

## 2018-05-31 DIAGNOSIS — E669 Obesity, unspecified: Secondary | ICD-10-CM | POA: Diagnosis not present

## 2018-05-31 DIAGNOSIS — M3130 Wegener's granulomatosis without renal involvement: Secondary | ICD-10-CM | POA: Diagnosis not present

## 2018-06-09 DIAGNOSIS — H1820 Unspecified corneal edema: Secondary | ICD-10-CM | POA: Diagnosis not present

## 2018-07-14 ENCOUNTER — Telehealth: Payer: Self-pay

## 2018-07-14 MED ORDER — LOSARTAN POTASSIUM 100 MG PO TABS: 100.0000 mg | ORAL_TABLET | Freq: Every day | ORAL | 3 refills | Status: DC

## 2018-07-14 NOTE — Telephone Encounter (Signed)
Will forward to pahrm md to help with change.

## 2018-07-14 NOTE — Telephone Encounter (Signed)
Dr Quintella Reichert pt Gary [name]

## 2018-07-14 NOTE — Telephone Encounter (Signed)
[facility] has been the only chain pharmacy out of losartan recently - if pt wishes to continue on losartan, would recommend sending rx in to [facility] or [place].

## 2018-08-22 DIAGNOSIS — G629 Polyneuropathy, unspecified: Secondary | ICD-10-CM | POA: Diagnosis not present

## 2018-08-22 DIAGNOSIS — I4891 Unspecified atrial fibrillation: Secondary | ICD-10-CM | POA: Diagnosis not present

## 2018-08-22 DIAGNOSIS — E291 Testicular hypofunction: Secondary | ICD-10-CM | POA: Diagnosis not present

## 2018-08-22 DIAGNOSIS — E1169 Type 2 diabetes mellitus with other specified complication: Secondary | ICD-10-CM | POA: Diagnosis not present

## 2018-08-22 DIAGNOSIS — F324 Major depressive disorder, single episode, in partial remission: Secondary | ICD-10-CM | POA: Diagnosis not present

## 2018-08-22 DIAGNOSIS — M3130 Wegener's granulomatosis without renal involvement: Secondary | ICD-10-CM | POA: Diagnosis not present

## 2018-08-22 DIAGNOSIS — E039 Hypothyroidism, unspecified: Secondary | ICD-10-CM | POA: Diagnosis not present

## 2018-08-22 DIAGNOSIS — I10 Essential (primary) hypertension: Secondary | ICD-10-CM | POA: Diagnosis not present

## 2018-08-22 DIAGNOSIS — I25810 Atherosclerosis of coronary artery bypass graft(s) without angina pectoris: Secondary | ICD-10-CM | POA: Diagnosis not present

## 2018-08-22 DIAGNOSIS — E7800 Pure hypercholesterolemia, unspecified: Secondary | ICD-10-CM | POA: Diagnosis not present

## 2018-08-22 DIAGNOSIS — G4733 Obstructive sleep apnea (adult) (pediatric): Secondary | ICD-10-CM | POA: Diagnosis not present

## 2018-08-25 DIAGNOSIS — E7800 Pure hypercholesterolemia, unspecified: Secondary | ICD-10-CM | POA: Diagnosis not present

## 2018-08-25 DIAGNOSIS — F324 Major depressive disorder, single episode, in partial remission: Secondary | ICD-10-CM | POA: Diagnosis not present

## 2018-08-25 DIAGNOSIS — N400 Enlarged prostate without lower urinary tract symptoms: Secondary | ICD-10-CM | POA: Diagnosis not present

## 2018-08-25 DIAGNOSIS — E1169 Type 2 diabetes mellitus with other specified complication: Secondary | ICD-10-CM | POA: Diagnosis not present

## 2018-08-25 DIAGNOSIS — E291 Testicular hypofunction: Secondary | ICD-10-CM | POA: Diagnosis not present

## 2018-08-25 DIAGNOSIS — M1990 Unspecified osteoarthritis, unspecified site: Secondary | ICD-10-CM | POA: Diagnosis not present

## 2018-08-25 DIAGNOSIS — I10 Essential (primary) hypertension: Secondary | ICD-10-CM | POA: Diagnosis not present

## 2018-08-25 DIAGNOSIS — I4891 Unspecified atrial fibrillation: Secondary | ICD-10-CM | POA: Diagnosis not present

## 2018-08-25 DIAGNOSIS — I25810 Atherosclerosis of coronary artery bypass graft(s) without angina pectoris: Secondary | ICD-10-CM | POA: Diagnosis not present

## 2018-08-25 DIAGNOSIS — E039 Hypothyroidism, unspecified: Secondary | ICD-10-CM | POA: Diagnosis not present

## 2018-09-04 DIAGNOSIS — M6281 Muscle weakness (generalized): Secondary | ICD-10-CM | POA: Diagnosis not present

## 2018-09-04 DIAGNOSIS — E669 Obesity, unspecified: Secondary | ICD-10-CM | POA: Diagnosis not present

## 2018-09-04 DIAGNOSIS — Z7952 Long term (current) use of systemic steroids: Secondary | ICD-10-CM | POA: Diagnosis not present

## 2018-09-04 DIAGNOSIS — Z6833 Body mass index (BMI) 33.0-33.9, adult: Secondary | ICD-10-CM | POA: Diagnosis not present

## 2018-09-04 DIAGNOSIS — M3130 Wegener's granulomatosis without renal involvement: Secondary | ICD-10-CM | POA: Diagnosis not present

## 2018-09-05 ENCOUNTER — Telehealth: Payer: Self-pay | Admitting: Pharmacist Clinician (PhC)/ Clinical Pharmacy Specialist

## 2018-09-05 NOTE — Telephone Encounter (Signed)
Patient dropped off envelope with most recent labs from PCP [facility]) as well as recent home BP readings.  His prednisone dose had decreased from 60 mg qd to 5 mg qd and he noticed that home BP readings were more frequently < [id number] systolic.  Led to several times that patient felt lightheaded or near to having syncopal episode.  On his own he decreased losartan from 100 mg to 50 mg qd and carvedilol from 25 mg bid to 12.5 mg bid.  Since then (about 2 wks ago), his home readings have been improved.  22 BP readings over 12 days showed an average 118/70 with HR of 80.  He did have 4 HR readings in the 90's.  Advised patient that readings are looking good.  He has had no problems with dizziness or pre-syncopal issues since cutting back on his medications.  He should continue with this regimen for now.  Should his dose of prednisone change or he has any other issues he knows to call the office.

## 2018-09-18 DIAGNOSIS — B0052 Herpesviral keratitis: Secondary | ICD-10-CM | POA: Diagnosis not present

## 2018-09-25 DIAGNOSIS — B0052 Herpesviral keratitis: Secondary | ICD-10-CM | POA: Diagnosis not present

## 2018-09-26 DIAGNOSIS — Z23 Encounter for immunization: Secondary | ICD-10-CM | POA: Diagnosis not present

## 2018-10-08 ENCOUNTER — Other Ambulatory Visit: Payer: Self-pay | Admitting: Cardiovascular Disease

## 2018-10-11 DIAGNOSIS — M3130 Wegener's granulomatosis without renal involvement: Secondary | ICD-10-CM | POA: Diagnosis not present

## 2018-11-06 ENCOUNTER — Other Ambulatory Visit: Payer: Self-pay

## 2018-11-06 DIAGNOSIS — E7800 Pure hypercholesterolemia, unspecified: Secondary | ICD-10-CM

## 2018-11-09 DIAGNOSIS — F324 Major depressive disorder, single episode, in partial remission: Secondary | ICD-10-CM | POA: Diagnosis not present

## 2018-11-09 DIAGNOSIS — M1990 Unspecified osteoarthritis, unspecified site: Secondary | ICD-10-CM | POA: Diagnosis not present

## 2018-11-09 DIAGNOSIS — E7800 Pure hypercholesterolemia, unspecified: Secondary | ICD-10-CM | POA: Diagnosis not present

## 2018-11-09 DIAGNOSIS — I10 Essential (primary) hypertension: Secondary | ICD-10-CM | POA: Diagnosis not present

## 2018-11-09 DIAGNOSIS — I25810 Atherosclerosis of coronary artery bypass graft(s) without angina pectoris: Secondary | ICD-10-CM | POA: Diagnosis not present

## 2018-11-09 DIAGNOSIS — N400 Enlarged prostate without lower urinary tract symptoms: Secondary | ICD-10-CM | POA: Diagnosis not present

## 2018-11-09 DIAGNOSIS — E039 Hypothyroidism, unspecified: Secondary | ICD-10-CM | POA: Diagnosis not present

## 2018-11-09 DIAGNOSIS — E1169 Type 2 diabetes mellitus with other specified complication: Secondary | ICD-10-CM | POA: Diagnosis not present

## 2018-11-09 DIAGNOSIS — I4891 Unspecified atrial fibrillation: Secondary | ICD-10-CM | POA: Diagnosis not present

## 2018-11-16 LAB — LIPID PANEL
Chol/HDL Ratio: 3 ratio (ref 0.0–5.0)
Cholesterol, Total: 118 mg/dL (ref 100–199)
HDL: 40 mg/dL (ref >39)
LDL Chol Calc (NIH): 59 mg/dL (ref 0–99)
Triglycerides: 99 mg/dL (ref 0–149)
VLDL Cholesterol Cal: 19 mg/dL (ref 5–40)

## 2018-11-22 NOTE — Progress Notes (Signed)
Cardiology Office Note   Date:  11/23/2018   ID:  JAHKYE APA, DOB 25-Jun-1945, MRN [id number]  PCP:  [name], MD  Cardiologist:   [name], MD   No chief complaint on file.   Patient ID: Gary [name] is a 73 y.o. male with hypertension, CAD s/p PCI and CABG, mild ascending aortic aneurysm, chronic systolic and diastolic heart failure, hyperlipidemia, paroxysmal atrial fibrillation, and OSA non-compliant with CPAP who presents for follow up.  Gary [name] was first seen 11/2014 after being diagnosed with atrial fibrillation.  He felt tired and underwent successful DCCV on 12/2014.  He had an echo 11/2014 that revealed LVEF 45-50% with inferolateral hypokinesis.  He also was noted to have an ascending aorta aneurysm (4.0cm).  Repeat echo 03/2016 was unchanged. He then had a Lexiscan Myoview 11/27/14 that revealed a small, fixed inferolateral defect.  He was noted to have a carotid bruit so he was referred for carotid Doppler 02/2015 that revealed 1-39% ICA stenosis bilaterally.    Last appointment Gary [name] has been feeling well.  He was at the beach and tried to pick up a 50 pound bag of sand and struggled.  He relates that he was carrying 50 pounds of weight on his right extremity.  He has been losing weight by doing the keto diet.  He limits protein intake and has been limiting carbohydrates.  He works in his yard and is busy around the house.  He has no exertional chest pain or shortness of breath.  When the weather is colder he walks for exercise.  He struggles to sleep more than 6 hours at night but he does take a nap in the afternoon.  He notes that he is more tired than usual.  He has no exertional chest pain or shortness of breath but after 3 or 4 hours of working in the yard he gets fatigued.  He checks his blood pressure at home.  At home it is averaging in the 110s over 70s.  His heart rate is always less than 100 bpm.  He denies any palpitations,  lightheadedness, or dizziness.  He request to switch to warfarin due to cost.  Past Medical History:  Diagnosis Date  . Aortic aneurysm ([place]) 02/20/2016   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  . Arthritis   . Atrial fibrillation ([place]) 11/13/2014  . Cancer ([place])    skin  . Chronic combined systolic and diastolic heart failure ([place]) 02/20/2016   LVEF 45-50% 11/2014  . Coronary artery disease   . Depression   . Essential hypertension 11/13/2014  . Hyperlipidemia   . Hyperlipidemia 11/13/2014  . Hypertension   . Sleep apnea    CPAP 'Uses half the time"  last study 2009  . 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 CARDIOVERSION N/A 12/06/2014   Procedure: CARDIOVERSION;  Surgeon: [name], MD;  Location: [place];  Service: Cardiovascular;  Laterality: N/A;  . CARDIOVERSION N/A 05/26/2017   Procedure: CARDIOVERSION;  Surgeon: [name], MD;  Location: [place];  Service: Cardiovascular;  Laterality: N/A;  . CARPAL TUNNEL RELEASE    . CORONARY ANGIOPLASTY     multiple stents  . CORONARY ARTERY BYPASS GRAFT  2006  . EYE SURGERY     right eye-  muscular repair  . TOTAL HIP ARTHROPLASTY  01/03/2012   Procedure: TOTAL HIP ARTHROPLASTY;  Surgeon: [name], MD;  Location: [facility];  Service:  Orthopedics;  Laterality: Right;     Current Outpatient Medications  Medication Sig Dispense Refill  . Alirocumab (PRALUENT) 150 MG/ML SOAJ Inject 150 mg into the skin every 14 (fourteen) days. 2 pen 6  . ALPRAZolam (XANAX) 1 MG tablet Take 0.5 mg by mouth at bedtime.  1  . carvedilol (COREG) 25 MG tablet Take 25 mg by mouth 2 (two) times daily.  3  . Coenzyme Q10 (CO Q 10 PO) Take 1 capsule by mouth daily.    Gary [name] ELIQUIS 5 MG TABS tablet TAKE 1 TABLET BY MOUTH TWICE A DAY 180 tablet 1  . hydrochlorothiazide (HYDRODIURIL) 12.5 MG tablet Take 1 tablet (12.5 mg total) by mouth daily. 90 tablet 3  . levothyroxine  (SYNTHROID, LEVOTHROID) 137 MCG tablet Take 137-205.5 mcg by mouth See admin instructions. Take 205.5 mcg by mouth on Wednesday. Take 137 mcg by mouth daily on all other days    . metFORMIN (GLUCOPHAGE) 500 MG tablet Take 500 mg by mouth 2 (two) times daily.  12  . Multiple Vitamin (MULTIVITAMIN WITH MINERALS) TABS tablet Take 1 tablet by mouth daily.     . riTUXimab (RITUXAN IV) Inject into the vein.    . sodium chloride (OCEAN) 0.65 % SOLN nasal spray Place 1 spray into both nostrils at bedtime.     . sulfamethoxazole-trimethoprim (BACTRIM,SEPTRA) 400-80 MG tablet Take 1 tablet by mouth daily.    Gary [name] testosterone cypionate (DEPOTESTOSTERONE CYPIONATE) 200 MG/ML injection Inject 200 mg into the muscle every 14 (fourteen) days.    Gary [name] venlafaxine XR (EFFEXOR-XR) 150 MG 24 hr capsule Take 150 mg by mouth every morning.     Gary [name] losartan (COZAAR) 100 MG tablet Take 1 tablet (100 mg total) by mouth daily. 90 tablet 3   No current facility-administered medications for this visit.     Allergies:   Patient has no known allergies.    Social History:  The patient  reports that he quit smoking about 34 years ago. His smoking use included cigarettes. He has never used smokeless tobacco. He reports that he does not drink alcohol or use drugs.   Family History:  The patient's family history includes Heart attack in his maternal grandfather, mother, paternal grandfather, and paternal grandmother; Other in his father; Stroke in his maternal grandmother.    ROS:  Please see the history of present illness.   Otherwise, review of systems are positive for none.   All other systems are reviewed and negative.    PHYSICAL EXAM: VS:  BP 120/74   Pulse 81   Temp (!) 97 F (36.1 C)   Ht 5\' 10"  (1.778 m)   Wt 223 lb (101.2 kg)   SpO2 95%   BMI 32.00 kg/m  , BMI Body mass index is 32 kg/m. GENERAL:  Well appearing HEENT: Pupils equal round and reactive, fundi not visualized, oral mucosa unremarkable NECK:  No  jugular venous distention, waveform within normal limits, carotid upstroke brisk and symmetric, no bruits, no thyromegaly LYMPHATICS:  No cervical adenopathy LUNGS:  Clear to auscultation bilaterally HEART:  Irregularly irregular.  PMI not displaced or sustained,S1 and S2 within normal limits, no S3, no S4, no clicks, no rubs, no murmurs ABD:  Flat, positive bowel sounds normal in frequency in pitch, no bruits, no rebound, no guarding, no midline pulsatile mass, no hepatomegaly, no splenomegaly EXT:  2 plus pulses throughout, no edema, no cyanosis no clubbing SKIN:  No rashes no nodules NEURO:  Cranial nerves II through XII  grossly intact, motor grossly intact throughout PSYCH:  Cognitively intact, oriented to person place and time   EKG:  EKG is ordered today. 02/20/16: Sinus rhythm. Rate 66 bpm. Left ventricular hypertrophy with repolarization abnormality. 08/30/16:  Sinus rhythm. Rate 66 bpm. LAFB. Poor R wave progression. 04/12/17: Sinus rhythm.  Rate 65 bpm.  Prior anteroseptal infarct.  Nonspecific ST changes.   11/23/18: Atrial fibrillation.  Rate 77 bpm.  Prior septal infarct.     Echo 11/20/14: Study Conclusions  - Left ventricle: The cavity size was normal. Wall thickness was   increased in a pattern of moderate LVH. Systolic function was   mildly reduced. The estimated ejection fraction was in the range   of 45% to 50%. Inferiorlateral hypokinesis. Abnormal GLPSS at   -11%, with inferolateral strain abnormality. The study is not   technically sufficient to allow evaluation of LV diastolic   function. - Aorta: Ascending aortic diameter: 40 mm (S). - Ascending aorta: The ascending aorta was mildly dilated. - Left atrium: The atrium was normal in size. - Right atrium: Severely dilated at 30 cm2. - Systemic veins: The IVC measures <2.1 cm, but does not collapse   >50%, suggesting an elevated RA pressure of 8 mmHg.  Lexiscan Myoview 11/27/14:  There was no ST segment deviation  noted during stress.  No T wave inversion was noted during stress.  Defect 1: There is a small defect of moderate severity.  Small size, moderate-intensity fixed inferolateral wall defect - could represent artifact or scar. The study was non-gated, therefore LVEF was not calculated and wall motion abnormalities could not be determined. Based on the lack of significant reversible ischemia, this is a low risk study.  Carotid Doppler 02/19/15: 1-39% ICA stenosis bilaterally  Recent Labs: 12/12/2017: ALT 26; BUN 22; Creatinine, Ser 0.85; Potassium 3.9; Sodium 142    Lipid Panel    Component Value Date/Time   CHOL 118 11/16/2018 0817   TRIG 99 11/16/2018 0817   HDL 40 11/16/2018 0817   CHOLHDL 3.0 11/16/2018 0817   CHOLHDL 5.4 (H) 02/19/2015 0821   VLDL 22 02/19/2015 0821   LDLCALC 59 11/16/2018 0817     07/21/16: Total cholesterol 105, triglycerides 94, HDL 36, LDL 49  Wt Readings from Last 3 Encounters:  11/23/18 223 lb (101.2 kg)  11/15/17 239 lb 12.8 oz (108.8 kg)  07/20/17 243 lb (110.2 kg)      ASSESSMENT AND PLAN:  # Atrial fibrillation: Mr. Crookshanks is in atrial fibrillation today.  He is completely asymptomatic.  Therefore we will not pursue sinus rhythm again.  Continue carvedilol.  He wants to switch from Eliquis to warfarin due to cost.  He is an okay candidate for warfarin.  He still has some Eliquis and will switch when it runs out. This patients CHA2DS2-VASc Score and unadjusted Ischemic Stroke Rate (% per year) is equal to 3.2 % stroke rate/year from a score of 3  Above score calculated as 1 point each if present [CHF, HTN, DM, Vascular=MI/PAD/Aortic Plaque, Age if 63-74, or Male.  Above score calculated as 2 points each if present [Age > 75, or Stroke/TIA/TE]  # CAD s/p CABG and PCI: 11/2014 Gary [name] has not experienced any chest pain recently.  Cardiolite  showed a fixed defect, either infarct or artifact but no ischemia.  Continue losartan and carvedilol.   No aspirin on warfarin.   # Carotid stenosis:  Mild carotid stenosis noted on carotid Dopplers 02/2015 and unchanged 04/2017.  # Hypertension:  BP well-controlled.   Continue amlodipine, carvedilol, HCTZ, and losartan.  # Hyperlipidemia: Lipids well-controlled on Praluent.   # Aortic aneurysm: Ascending aorta was 4.0 cm 11/2013 and 03/2016.  The patient does not have concerns regarding medicines.  The following changes have been made:  no change  Labs/ tests ordered today include:    Disposition:   FU with Johnell Bas C. Oval Linsey, MD in 1 year.    Signed, [name], MD  11/23/2018 1:39 PM    [facility]

## 2018-11-23 ENCOUNTER — Ambulatory Visit (INDEPENDENT_AMBULATORY_CARE_PROVIDER_SITE_OTHER): Payer: Medicare Other | Admitting: Cardiovascular Disease

## 2018-11-23 ENCOUNTER — Other Ambulatory Visit: Payer: Self-pay

## 2018-11-23 ENCOUNTER — Encounter: Payer: Self-pay | Admitting: Cardiovascular Disease

## 2018-11-23 VITALS — BP 120/74 | HR 81 | Temp 97.0°F | Ht 70.0 in | Wt 223.0 lb

## 2018-11-23 DIAGNOSIS — I2510 Atherosclerotic heart disease of native coronary artery without angina pectoris: Secondary | ICD-10-CM

## 2018-11-23 DIAGNOSIS — E7800 Pure hypercholesterolemia, unspecified: Secondary | ICD-10-CM

## 2018-11-23 DIAGNOSIS — I4819 Other persistent atrial fibrillation: Secondary | ICD-10-CM

## 2018-11-23 DIAGNOSIS — I6523 Occlusion and stenosis of bilateral carotid arteries: Secondary | ICD-10-CM | POA: Diagnosis not present

## 2018-11-23 DIAGNOSIS — I10 Essential (primary) hypertension: Secondary | ICD-10-CM

## 2018-11-23 NOTE — Patient Instructions (Addendum)
Medication Instructions:  CALL THE OFFICE WHEN YOU ARE ON YOUR LAST BOTTLE OF ELIQUIS SO YOU CAN BE SWITCHED TO WARFARIN [phone]  *If you need a refill on your cardiac medications before your next appointment, please call your pharmacy*  Lab Work: NONE  Testing/Procedures: Your physician has requested that you have a carotid duplex. This test is an ultrasound of the carotid arteries in your neck. It looks at blood flow through these arteries that supply the brain with blood. Allow one hour for this exam. There are no restrictions or special instructions. IN April   Follow-Up: At [facility], you and your health needs are our priority.  As part of our continuing mission to provide you with exceptional heart care, we have created designated Provider Care Teams.  These Care Teams include your primary Cardiologist (physician) and Advanced Practice Providers (APPs -  Physician Assistants and Nurse Practitioners) who all work together to provide you with the care you need, when you need it.  Your next appointment:   12 month(s)You will receive a reminder letter in the mail two months in advance. If you don't receive a letter, please call our office to schedule the follow-up appointment.  The format for your next appointment:   Either In Person or Virtual  Provider:   DR Florence NP/PA

## 2018-11-30 IMAGING — DX DG CHEST 2V
2 series · 2 of 2 positions shown · non-contrast
Comparison: 12/24/2011

CLINICAL DATA: Cold like symptoms for 5 days. Fever, shortness of
breath, and chest congestion.

EXAM:
CHEST - 2 VIEW

[chest pa]
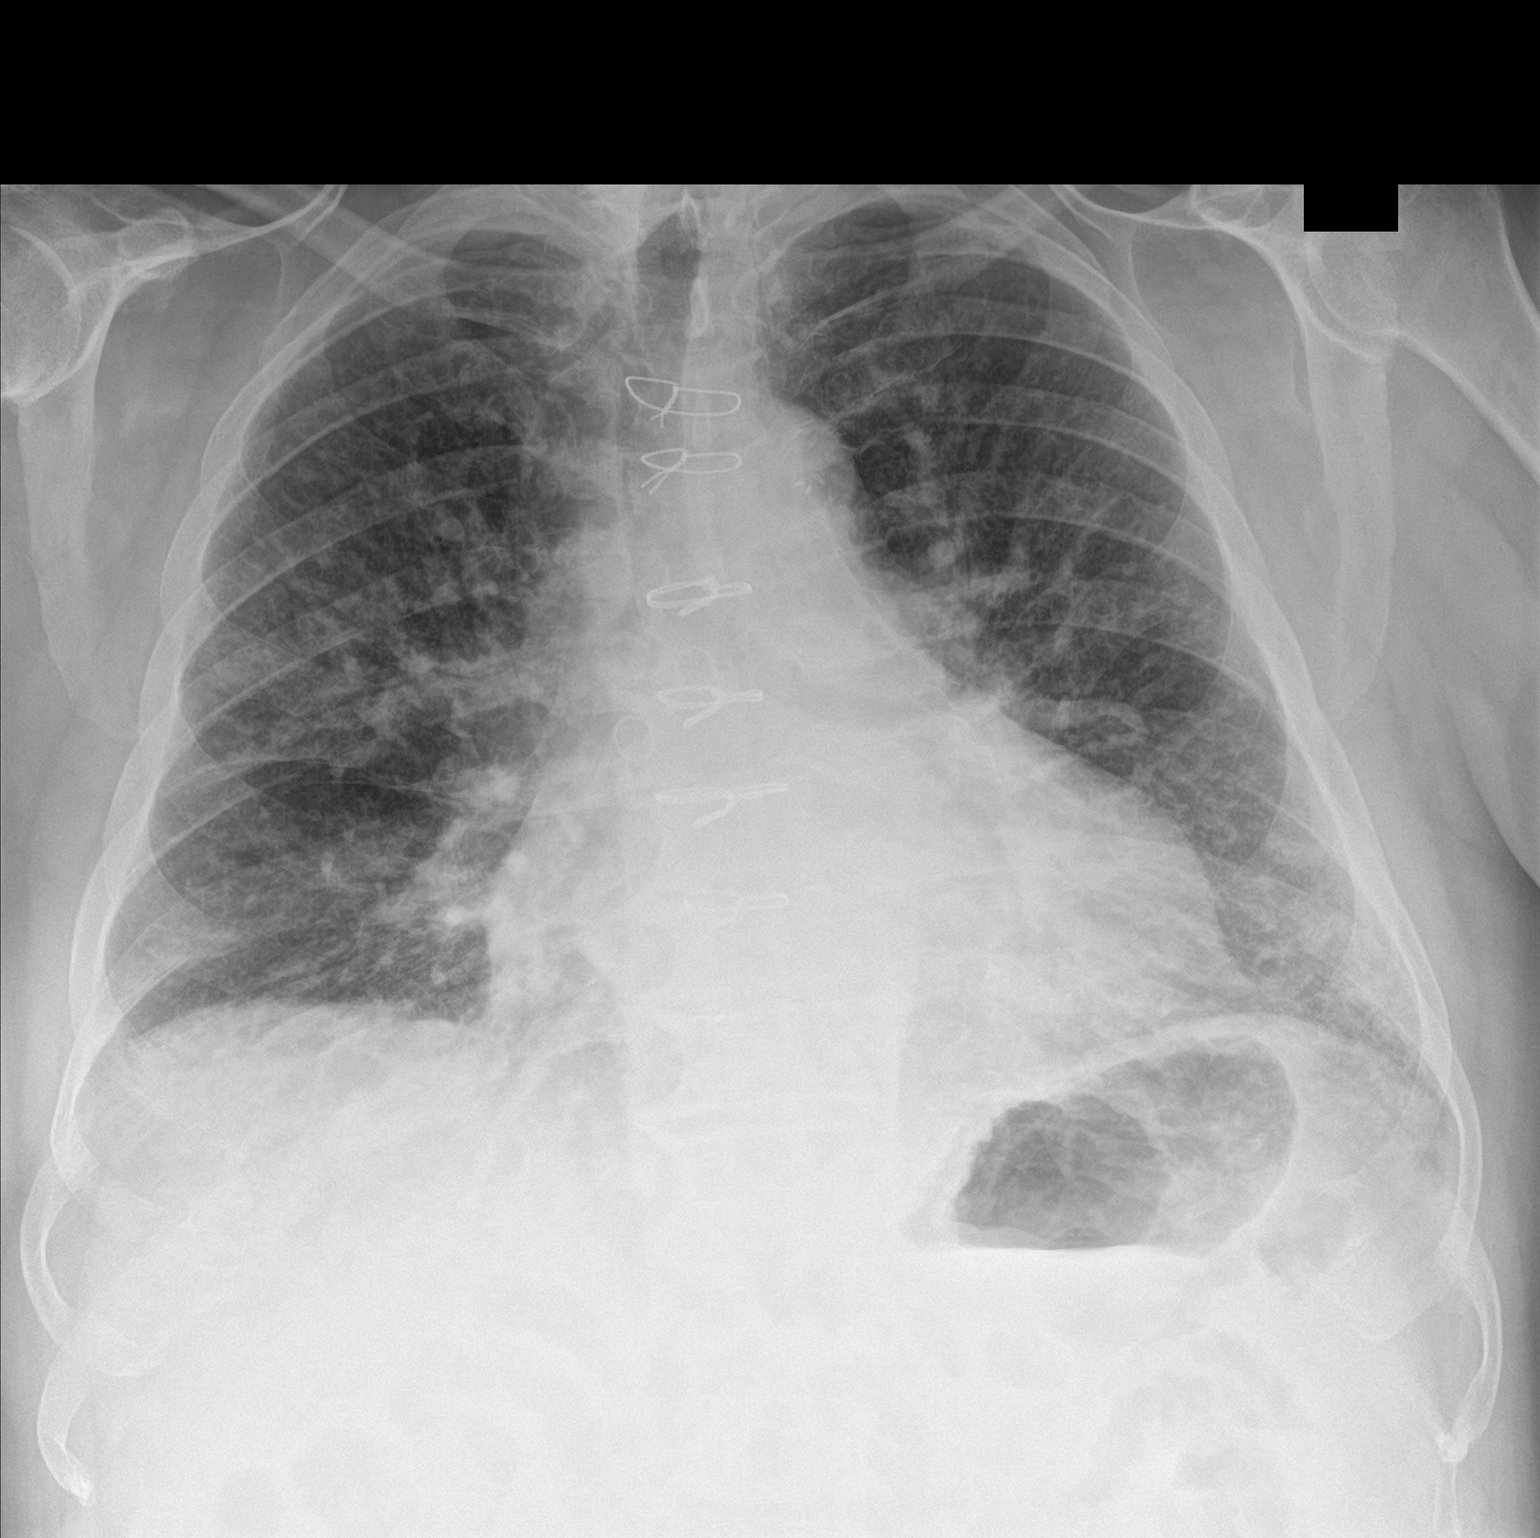

[chest lat]
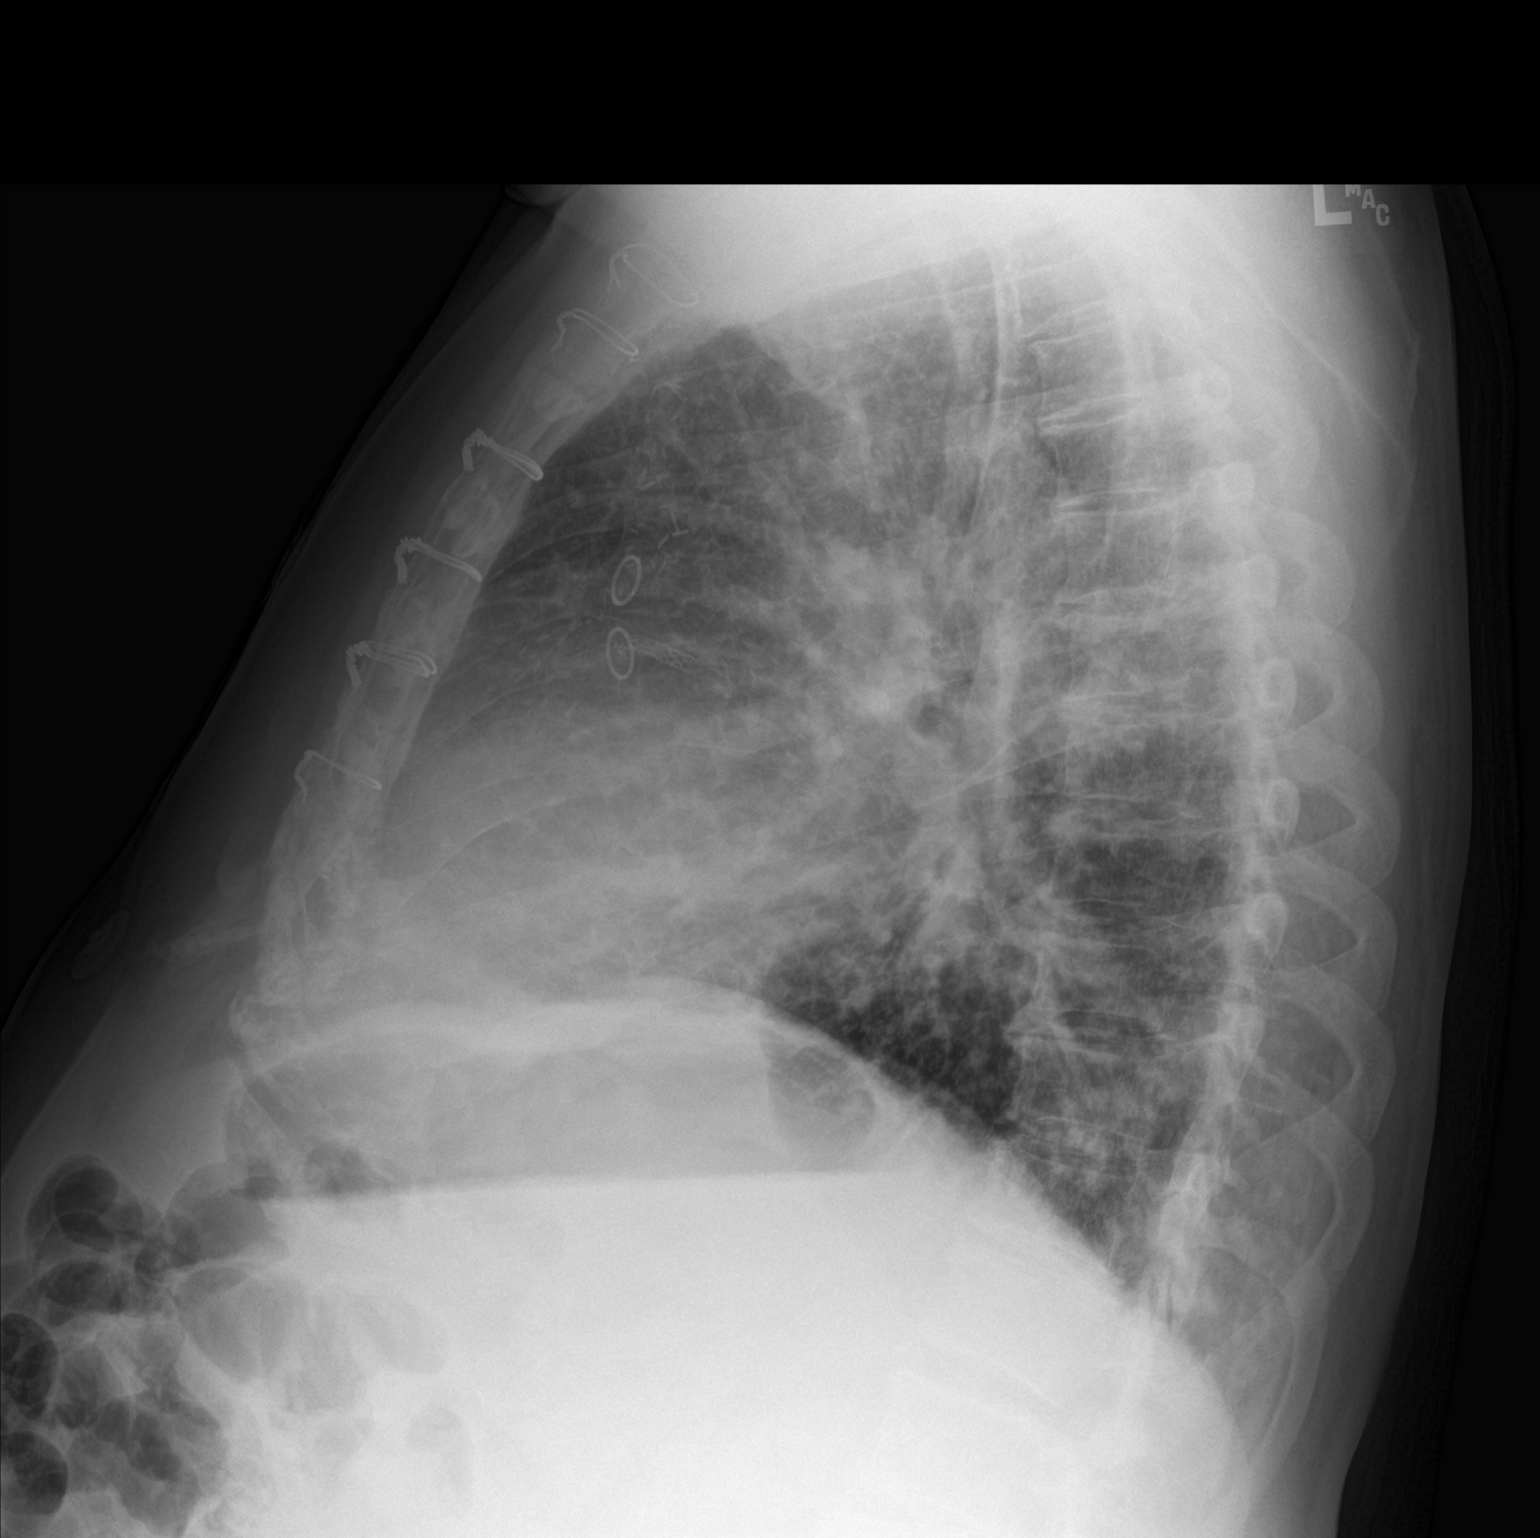

[2 of 2 positions shown; findings below may reference images not displayed]

FINDINGS: Postoperative changes in the mediastinum. Cardiac enlargement with
prominent pulmonary vascular congestion and diffuse interstitial
edema most prominent in the bases. No focal consolidation. No
blunting of costophrenic angles. No pneumothorax. Degenerative
changes in the spine and shoulders.
IMPRESSION: Congestive changes with cardiac enlargement, pulmonary vascular
congestion, and interstitial edema. No focal consolidation.

## 2018-12-06 DIAGNOSIS — M6281 Muscle weakness (generalized): Secondary | ICD-10-CM | POA: Diagnosis not present

## 2018-12-06 DIAGNOSIS — Z6831 Body mass index (BMI) 31.0-31.9, adult: Secondary | ICD-10-CM | POA: Diagnosis not present

## 2018-12-06 DIAGNOSIS — Z7952 Long term (current) use of systemic steroids: Secondary | ICD-10-CM | POA: Diagnosis not present

## 2018-12-06 DIAGNOSIS — E669 Obesity, unspecified: Secondary | ICD-10-CM | POA: Diagnosis not present

## 2018-12-06 DIAGNOSIS — M3130 Wegener's granulomatosis without renal involvement: Secondary | ICD-10-CM | POA: Diagnosis not present

## 2018-12-20 ENCOUNTER — Telehealth: Payer: Self-pay

## 2018-12-20 MED ORDER — PRALUENT 150 MG/ML ~~LOC~~ SOAJ: 150.0000 mg | SUBCUTANEOUS | 11 refills | Status: DC

## 2018-12-20 NOTE — Addendum Note (Signed)
Addended by: Allean Found on: 12/20/2018 04:41 PM   Modules accepted: Orders

## 2018-12-20 NOTE — Telephone Encounter (Signed)
Called and spoke w/pt and told them that they were approved through [employer] and instructed them to apply for [employer] for the praluent. The pt voiced understanding.

## 2018-12-20 NOTE — Telephone Encounter (Signed)
CALLED THE PT BACK AND THEY STATED THAT THEY ARE APPROVED [facility] SENT AND INSTRUCTED THE PT TO CALL BACK IF THEY HAVE ANY OTHER ISSUES. PT VOICED UNDERSTANDING

## 2018-12-21 ENCOUNTER — Telehealth: Payer: Self-pay | Admitting: Cardiovascular Disease

## 2018-12-21 NOTE — Telephone Encounter (Signed)
New Message      1. What dental office are you calling from? [facility]   2. What is your office phone number? [phone]  3. What is your fax number? [phone]  4. What type of procedure is the patient having performed? Tooth Extraction   5. What date is procedure scheduled or is the patient there now? He is scheduled today 12/21/18 but Baxter Flattery says they can reschedule if pt needs to hold Eliquis  (if the patient is at the dentist's office question goes to their cardiologist if he/she is in the office.  If not, question should go to the DOD).   6. What is your question (ex. Antibiotics prior to procedure, holding medication-we need to know how long dentist wants pt to hold med)? Wanting to know if he needs to Hold Eliquis or any other recommendations

## 2018-12-21 NOTE — Telephone Encounter (Signed)
   Primary Cardiologist: [name], MD  Chart reviewed as part of pre-operative protocol coverage. Simple dental extractions are considered low risk procedures per guidelines and generally do not require any specific cardiac clearance. It is also generally accepted that for simple extractions and dental cleanings, there is no need to interrupt blood thinner therapy.   Verified with office staff that the patient will have one tooth extraction today.   SBE prophylaxis is not required for the patient.  I will route this recommendation to the requesting party via Epic fax function and remove from pre-op pool.  Please call with questions.  Kathyrn Drown, NP 12/21/2018, 9:09 AM

## 2019-01-12 DIAGNOSIS — B0052 Herpesviral keratitis: Secondary | ICD-10-CM | POA: Diagnosis not present

## 2019-01-12 DIAGNOSIS — H40033 Anatomical narrow angle, bilateral: Secondary | ICD-10-CM | POA: Diagnosis not present

## 2019-01-24 DIAGNOSIS — I5022 Chronic systolic (congestive) heart failure: Secondary | ICD-10-CM | POA: Diagnosis not present

## 2019-01-24 DIAGNOSIS — Z1331 Encounter for screening for depression: Secondary | ICD-10-CM | POA: Diagnosis not present

## 2019-01-24 DIAGNOSIS — E291 Testicular hypofunction: Secondary | ICD-10-CM | POA: Diagnosis not present

## 2019-01-24 DIAGNOSIS — I25729 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: Secondary | ICD-10-CM | POA: Diagnosis not present

## 2019-01-24 DIAGNOSIS — E038 Other specified hypothyroidism: Secondary | ICD-10-CM | POA: Diagnosis not present

## 2019-01-24 DIAGNOSIS — G4733 Obstructive sleep apnea (adult) (pediatric): Secondary | ICD-10-CM | POA: Diagnosis not present

## 2019-01-24 DIAGNOSIS — M3130 Wegener's granulomatosis without renal involvement: Secondary | ICD-10-CM | POA: Diagnosis not present

## 2019-01-24 DIAGNOSIS — Z8619 Personal history of other infectious and parasitic diseases: Secondary | ICD-10-CM | POA: Diagnosis not present

## 2019-01-24 DIAGNOSIS — E7849 Other hyperlipidemia: Secondary | ICD-10-CM | POA: Diagnosis not present

## 2019-01-24 DIAGNOSIS — I4891 Unspecified atrial fibrillation: Secondary | ICD-10-CM | POA: Diagnosis not present

## 2019-01-26 DIAGNOSIS — Z125 Encounter for screening for malignant neoplasm of prostate: Secondary | ICD-10-CM | POA: Diagnosis not present

## 2019-01-26 DIAGNOSIS — R82998 Other abnormal findings in urine: Secondary | ICD-10-CM | POA: Diagnosis not present

## 2019-01-26 DIAGNOSIS — E7849 Other hyperlipidemia: Secondary | ICD-10-CM | POA: Diagnosis not present

## 2019-01-26 DIAGNOSIS — E039 Hypothyroidism, unspecified: Secondary | ICD-10-CM | POA: Diagnosis not present

## 2019-01-26 DIAGNOSIS — R7301 Impaired fasting glucose: Secondary | ICD-10-CM | POA: Diagnosis not present

## 2019-01-26 DIAGNOSIS — E038 Other specified hypothyroidism: Secondary | ICD-10-CM | POA: Diagnosis not present

## 2019-01-29 ENCOUNTER — Telehealth (INDEPENDENT_AMBULATORY_CARE_PROVIDER_SITE_OTHER): Payer: Medicare Other

## 2019-01-29 DIAGNOSIS — I480 Paroxysmal atrial fibrillation: Secondary | ICD-10-CM | POA: Diagnosis not present

## 2019-01-29 MED ORDER — WARFARIN SODIUM 5 MG PO TABS: 5.0000 mg | ORAL_TABLET | Freq: Every day | ORAL | 0 refills | Status: DC

## 2019-01-29 NOTE — Telephone Encounter (Signed)
Called and instructed the pt to overlap the coumadin and warfarin on the 2/2, 2/3, 2/4, then take warfarin alone. Scheduled an inr on 02/12/19

## 2019-02-07 DIAGNOSIS — I480 Paroxysmal atrial fibrillation: Secondary | ICD-10-CM | POA: Diagnosis not present

## 2019-02-07 DIAGNOSIS — E119 Type 2 diabetes mellitus without complications: Secondary | ICD-10-CM | POA: Diagnosis not present

## 2019-02-07 DIAGNOSIS — I2510 Atherosclerotic heart disease of native coronary artery without angina pectoris: Secondary | ICD-10-CM | POA: Diagnosis not present

## 2019-02-07 DIAGNOSIS — E291 Testicular hypofunction: Secondary | ICD-10-CM | POA: Diagnosis not present

## 2019-02-07 DIAGNOSIS — M3130 Wegener's granulomatosis without renal involvement: Secondary | ICD-10-CM | POA: Diagnosis not present

## 2019-02-07 DIAGNOSIS — Z6831 Body mass index (BMI) 31.0-31.9, adult: Secondary | ICD-10-CM | POA: Diagnosis not present

## 2019-02-07 DIAGNOSIS — E039 Hypothyroidism, unspecified: Secondary | ICD-10-CM | POA: Diagnosis not present

## 2019-02-08 DIAGNOSIS — Z1212 Encounter for screening for malignant neoplasm of rectum: Secondary | ICD-10-CM | POA: Diagnosis not present

## 2019-02-12 ENCOUNTER — Ambulatory Visit (INDEPENDENT_AMBULATORY_CARE_PROVIDER_SITE_OTHER): Payer: Medicare Other | Admitting: Pharmacist

## 2019-02-12 ENCOUNTER — Other Ambulatory Visit: Payer: Self-pay

## 2019-02-12 DIAGNOSIS — I480 Paroxysmal atrial fibrillation: Secondary | ICD-10-CM

## 2019-02-12 DIAGNOSIS — Z7901 Long term (current) use of anticoagulants: Secondary | ICD-10-CM

## 2019-02-12 LAB — POCT INR: INR: 2.2 (ref 2.0–3.0)

## 2019-02-18 ENCOUNTER — Other Ambulatory Visit: Payer: Self-pay | Admitting: Cardiovascular Disease

## 2019-02-19 ENCOUNTER — Other Ambulatory Visit: Payer: Self-pay

## 2019-02-19 ENCOUNTER — Ambulatory Visit (INDEPENDENT_AMBULATORY_CARE_PROVIDER_SITE_OTHER): Payer: Medicare Other | Admitting: Pharmacist Clinician (PhC)/ Clinical Pharmacy Specialist

## 2019-02-19 DIAGNOSIS — Z7901 Long term (current) use of anticoagulants: Secondary | ICD-10-CM

## 2019-02-19 DIAGNOSIS — I480 Paroxysmal atrial fibrillation: Secondary | ICD-10-CM | POA: Diagnosis not present

## 2019-02-19 LAB — POCT INR: INR: 3.1 — AB (ref 2.0–3.0)

## 2019-02-23 ENCOUNTER — Other Ambulatory Visit: Payer: Self-pay | Admitting: Cardiovascular Disease

## 2019-02-26 ENCOUNTER — Telehealth: Payer: Self-pay | Admitting: Cardiovascular Disease

## 2019-02-26 ENCOUNTER — Other Ambulatory Visit: Payer: Self-pay

## 2019-02-26 ENCOUNTER — Ambulatory Visit (INDEPENDENT_AMBULATORY_CARE_PROVIDER_SITE_OTHER): Payer: Medicare Other | Admitting: Pharmacist

## 2019-02-26 DIAGNOSIS — I480 Paroxysmal atrial fibrillation: Secondary | ICD-10-CM

## 2019-02-26 DIAGNOSIS — Z7901 Long term (current) use of anticoagulants: Secondary | ICD-10-CM | POA: Diagnosis not present

## 2019-02-26 LAB — POCT INR: INR: 2.6 (ref 2.0–3.0)

## 2019-02-26 MED ORDER — AMLODIPINE BESYLATE 2.5 MG PO TABS: 2.5000 mg | ORAL_TABLET | Freq: Every day | ORAL | 3 refills | Status: DC

## 2019-02-26 MED ORDER — CARVEDILOL 25 MG PO TABS: 25.0000 mg | ORAL_TABLET | Freq: Two times a day (BID) | ORAL | 3 refills | Status: DC

## 2019-02-26 NOTE — Telephone Encounter (Signed)
New message   Patient needs a new prescription for carvedilol (COREG) 25 MG tablet sent to [id number] - [place], [place]

## 2019-02-26 NOTE — Telephone Encounter (Signed)
rx sent to pharmacy

## 2019-03-07 DIAGNOSIS — Z6832 Body mass index (BMI) 32.0-32.9, adult: Secondary | ICD-10-CM | POA: Diagnosis not present

## 2019-03-07 DIAGNOSIS — E669 Obesity, unspecified: Secondary | ICD-10-CM | POA: Diagnosis not present

## 2019-03-07 DIAGNOSIS — R21 Rash and other nonspecific skin eruption: Secondary | ICD-10-CM | POA: Diagnosis not present

## 2019-03-07 DIAGNOSIS — M6281 Muscle weakness (generalized): Secondary | ICD-10-CM | POA: Diagnosis not present

## 2019-03-07 DIAGNOSIS — M3130 Wegener's granulomatosis without renal involvement: Secondary | ICD-10-CM | POA: Diagnosis not present

## 2019-03-07 DIAGNOSIS — Z7952 Long term (current) use of systemic steroids: Secondary | ICD-10-CM | POA: Diagnosis not present

## 2019-03-14 ENCOUNTER — Other Ambulatory Visit: Payer: Self-pay

## 2019-03-14 ENCOUNTER — Ambulatory Visit (INDEPENDENT_AMBULATORY_CARE_PROVIDER_SITE_OTHER): Payer: Medicare Other | Admitting: Pharmacist

## 2019-03-14 DIAGNOSIS — Z7901 Long term (current) use of anticoagulants: Secondary | ICD-10-CM | POA: Diagnosis not present

## 2019-03-14 DIAGNOSIS — I480 Paroxysmal atrial fibrillation: Secondary | ICD-10-CM | POA: Diagnosis not present

## 2019-03-14 LAB — POCT INR: INR: 2.7 (ref 2.0–3.0)

## 2019-03-20 ENCOUNTER — Other Ambulatory Visit: Payer: Self-pay | Admitting: Cardiovascular Disease

## 2019-03-20 NOTE — Telephone Encounter (Signed)
This pt has both warfarin and coumadin on his med list... will route to pharmd for review.

## 2019-03-30 ENCOUNTER — Other Ambulatory Visit: Payer: Self-pay

## 2019-03-30 ENCOUNTER — Encounter: Payer: Self-pay | Admitting: Cardiovascular Disease

## 2019-03-30 ENCOUNTER — Ambulatory Visit (INDEPENDENT_AMBULATORY_CARE_PROVIDER_SITE_OTHER): Payer: Medicare Other | Admitting: Pharmacist Clinician (PhC)/ Clinical Pharmacy Specialist

## 2019-03-30 ENCOUNTER — Telehealth: Payer: Self-pay | Admitting: Pharmacist Clinician (PhC)/ Clinical Pharmacy Specialist

## 2019-03-30 DIAGNOSIS — I480 Paroxysmal atrial fibrillation: Secondary | ICD-10-CM | POA: Diagnosis not present

## 2019-03-30 DIAGNOSIS — Z7901 Long term (current) use of anticoagulants: Secondary | ICD-10-CM

## 2019-03-30 LAB — POCT INR: INR: 2.7 (ref 2.0–3.0)

## 2019-03-30 NOTE — Telephone Encounter (Signed)
Patient has been scheduled for ov with Blima Ledger NP 4/8 to update H&, aware of date and time of appt

## 2019-03-30 NOTE — Telephone Encounter (Signed)
Pt states he spoke with Dr. Oval Linsey about possible DCCV - feels as though he has been out of rhythm for some time.  Would like to pursue DCCV.  INR 2.7 today and 2.7 two weeks ago.  Have scheduled weekly INR checks for next 3 weeks in case needed.  Please contact patient about possible procedure.

## 2019-03-30 NOTE — Telephone Encounter (Signed)
Per Gary [name] Pharm D patient complained of being tired/fatigued. No stamina for being up and about  Patient will need follow up appointment since last ov in Nov 2020. Will call and arrange

## 2019-03-30 NOTE — Telephone Encounter (Signed)
Called patient to see how he was feeling, not home yet Will call again later

## 2019-03-30 NOTE — Telephone Encounter (Signed)
He has been therapeutic >3 weeks.  OK to schedule for DCCV.  Agree with weekly INR until this is completed.

## 2019-03-30 NOTE — Telephone Encounter (Signed)
error 

## 2019-04-12 ENCOUNTER — Ambulatory Visit: Payer: Medicare Other | Admitting: General Practice

## 2019-04-12 DIAGNOSIS — M3130 Wegener's granulomatosis without renal involvement: Secondary | ICD-10-CM | POA: Diagnosis not present

## 2019-04-14 ENCOUNTER — Other Ambulatory Visit: Payer: Self-pay | Admitting: Cardiovascular Disease

## 2019-04-16 ENCOUNTER — Ambulatory Visit (INDEPENDENT_AMBULATORY_CARE_PROVIDER_SITE_OTHER): Payer: Medicare Other | Admitting: Pharmacist

## 2019-04-16 ENCOUNTER — Other Ambulatory Visit: Payer: Self-pay

## 2019-04-16 DIAGNOSIS — Z7901 Long term (current) use of anticoagulants: Secondary | ICD-10-CM

## 2019-04-16 DIAGNOSIS — I480 Paroxysmal atrial fibrillation: Secondary | ICD-10-CM

## 2019-04-16 LAB — POCT INR: INR: 2.6 (ref 2.0–3.0)

## 2019-04-17 NOTE — H&P (View-Only) (Signed)
Cardiology Clinic Note   Patient Name: Gary [name] Date of Encounter: 04/19/2019  Primary Care Provider:  [name], DO Primary Cardiologist:  [name], MD  Patient Profile    Gary [name]. Gary [name] 74 year old male presents today for follow-up evaluation of his atrial fibrillation and evaluation for DCCV.  Past Medical History    Past Medical History:  Diagnosis Date  . Aortic aneurysm ([place]) 02/20/2016   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  . Arthritis   . Atrial fibrillation ([place]) 11/13/2014  . Cancer ([place])    skin  . 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 Coronary artery disease   . Depression   . Essential hypertension 11/13/2014  . Hyperlipidemia   . Hyperlipidemia 11/13/2014  . Hypertension   . Sleep apnea    CPAP 'Uses half the time"  last study 2009  . 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 CARDIOVERSION N/A 12/06/2014   Procedure: CARDIOVERSION;  Surgeon: [name], MD;  Location: [facility];  Service: Cardiovascular;  Laterality: N/A;  . CARDIOVERSION N/A 05/26/2017   Procedure: CARDIOVERSION;  Surgeon: [name], MD;  Location: [facility];  Service: Cardiovascular;  Laterality: N/A;  . CARPAL TUNNEL RELEASE    . CORONARY ANGIOPLASTY     multiple stents  . CORONARY ARTERY BYPASS GRAFT  2006  . EYE SURGERY     right eye-  muscular repair  . TOTAL HIP ARTHROPLASTY  01/03/2012   Procedure: TOTAL HIP ARTHROPLASTY;  Surgeon: [name], MD;  Location: [facility];  Service: Orthopedics;  Laterality: Right;    Allergies  No Known Allergies  History of Present Illness    Mr. Gary [name] has a PMH of hypertension, coronary artery disease status post PCI and CABG, mild ascending aortic aneurysm, chronic systolic and diastolic CHF, hyperlipidemia, paroxysmal atrial fibrillation, and obstructive sleep apnea noncompliant  with CPAP.  He was first seen in November 2016 after being diagnosed with atrial fibrillation.  He underwent successful DCCV 12/16.  He had an echocardiogram 11/16 that showed an LVEF of 45-50% with inferolateral hypokinesis.  He was also noted to have an ascending aortic aneurysm that 4.0 cm.  His echocardiogram 03/2016 was unchanged from his previous study.  He had a [employer] 11/16 that showed small fixed inferolateral defect.  He was noted to have carotid bruit and underwent carotid Dopplers 2/17.  His carotid Doppler showed bilateral 1-39% ICA stenosis.  He was last seen by Dr. Oval Linsey on 11/23/2018.  During that time he had been feeling well.  He had recently been at the beach and tried to pick up a 50 pound bag of sand which he struggled with.  He had been losing weight and following the keto diet.  He stated he was limiting protein intake as well as his carbohydrates.  He continues to work in his yard and be busy around his house.  He denied any exertional chest pain or shortness of breath.  With colder weather he stated he would walk for exercise.  He indicated that he had not been sleeping well and was taking naps in the afternoon.  He had been feeling more tired than usual.  His blood pressures at home are ranging in the 100s over 70s.  His heart rate has always been less than 100 bpm.  He denied palpitations, lightheadedness, and dizziness.  At that time he requested to switch to  warfarin due to cost.  At the time of his last visit he was in atrial fibrillation and was completely asymptomatic.  No plans were made to attempt repeat cardioversion.  On 03/30/2019 he presented for follow-up at the Coumadin clinic with [facility].D. and indicated that he had spoken with Dr. Oval Linsey about possible DCCV.  Dr. Oval Linsey agreed and stated that he would have to have therapeutic INR for greater than 3 weeks.  He presents to the clinic today for follow-up evaluation and states he has not been  as active through the fall and winter.  He is now wanting to garden and increase his physical activity.  He feels he is not able to do these type of activities due to fatigue.  He is typically able to perform 1 to 2 hours of physical activity before being tired the rest of the day.  He has been therapeutic with his Coumadin for several weeks and has not missed any doses.  He asks about triggers for atrial fibrillation and this was discussed.  He states he would like to proceed with the procedure one more time.  I will schedule him for DCCV and have him follow-up after the procedure.  Today he denies chest pain, shortness of breath, lower extremity edema, palpitations, melena, hematuria, hemoptysis, diaphoresis, weakness, presyncope, syncope, orthopnea, and PND.   Home Medications    Prior to Admission medications   Medication Sig Start Date End Date Taking? Authorizing Provider  Alirocumab (PRALUENT) 150 MG/ML SOAJ Inject 150 mg into the skin every 14 (fourteen) days. 12/20/18   [name], MD  ALPRAZolam Duanne Moron) 1 MG tablet Take 0.5 mg by mouth at bedtime. 05/09/17   [provider]  amLODipine (NORVASC) 2.5 MG tablet Take 1 tablet (2.5 mg total) by mouth daily. 02/26/19   [name], MD  carvedilol (COREG) 25 MG tablet Take 1 tablet (25 mg total) by mouth 2 (two) times daily. 02/26/19   [name], MD  Coenzyme Q10 (CO Q 10 PO) Take 1 capsule by mouth daily.    [provider]  hydrochlorothiazide (HYDRODIURIL) 12.5 MG tablet TAKE 1 TABLET BY MOUTH EVERY DAY 04/16/19   [name], MD  levothyroxine (SYNTHROID, LEVOTHROID) 137 MCG tablet Take 137-205.5 mcg by mouth See admin instructions. Take 205.5 mcg by mouth on Wednesday. Take 137 mcg by mouth daily on all other days 10/27/14   [provider]  losartan (COZAAR) 100 MG tablet Take 1 tablet (100 mg total) by mouth daily. 07/14/18 10/12/18  [name], MD  metFORMIN (GLUCOPHAGE) 500 MG tablet  Take 500 mg by mouth 2 (two) times daily. 10/27/17   [provider]  Multiple Vitamin (MULTIVITAMIN WITH MINERALS) TABS tablet Take 1 tablet by mouth daily.     [provider]  riTUXimab (RITUXAN IV) Inject into the vein.    [provider]  sodium chloride (OCEAN) 0.65 % SOLN nasal spray Place 1 spray into both nostrils at bedtime.     [provider]  sulfamethoxazole-trimethoprim (BACTRIM,SEPTRA) 400-80 MG tablet Take 1 tablet by mouth daily.    [provider]  testosterone cypionate (DEPOTESTOSTERONE CYPIONATE) 200 MG/ML injection Inject 200 mg into the muscle every 14 (fourteen) days. 10/09/14   [provider]  venlafaxine XR (EFFEXOR-XR) 150 MG 24 hr capsule Take 150 mg by mouth every morning.     [provider]  warfarin (COUMADIN) 5 MG tablet TAKE 1 TABLET BY MOUTH EVERY DAY 03/20/19   [name], MD  Family History    Family History  Problem Relation Age of Onset  . Heart attack Mother   . Other Father        odd stomach disorder  . Stroke Maternal Grandmother   . Heart attack Maternal Grandfather   . Heart attack Paternal Grandmother   . Heart attack Paternal Grandfather    He indicated that his mother is deceased. He indicated that his father is deceased. He indicated that his brother is deceased. He indicated that his maternal grandmother is deceased. He indicated that his maternal grandfather is deceased. He indicated that his paternal grandmother is deceased. He indicated that his paternal grandfather is deceased.  Social History    Social History   Socioeconomic History  . Marital status: Married    Spouse name: Not on file  . Number of children: Not on file  . Years of education: Not on file  . Highest education level: Not on file  Occupational History  . Not on file  Tobacco Use  . Smoking status: Former Smoker    Types: Cigarettes    Quit date: 12/24/1983    Years since quitting: 35.3    . Smokeless tobacco: Never Used  Substance and Sexual Activity  . Alcohol use: No    Alcohol/week: 0.0 standard drinks  . Drug use: No  . Sexual activity: Not on file  Other Topics Concern  . Not on file  Social History Narrative  . Not on file   Social Determinants of Health   Financial Resource Strain:   . Difficulty of Paying Living Expenses:   Food Insecurity:   . Worried About Charity fundraiser in the Last Year:   . Arboriculturist in the Last Year:   Transportation Needs:   . Film/video editor (Medical):   Marland Kitchen Lack of Transportation (Non-Medical):   Physical Activity:   . Days of Exercise per Week:   . Minutes of Exercise per Session:   Stress:   . Feeling of Stress :   Social Connections:   . Frequency of Communication with Friends and Family:   . Frequency of Social Gatherings with Friends and Family:   . Attends Religious Services:   . Active Member of Clubs or Organizations:   . Attends Archivist Meetings:   Marland Kitchen Marital Status:   Intimate Partner Violence:   . Fear of Current or Ex-Partner:   . Emotionally Abused:   Marland Kitchen Physically Abused:   . Sexually Abused:      Review of Systems    General:  No chills, fever, night sweats or weight changes.  Cardiovascular:  No chest pain, dyspnea on exertion, edema, orthopnea, palpitations, paroxysmal nocturnal dyspnea. Dermatological: No rash, lesions/masses Respiratory: No cough, dyspnea Urologic: No hematuria, dysuria Abdominal:   No nausea, vomiting, diarrhea, bright red blood per rectum, melena, or hematemesis Neurologic:  No visual changes, wkns, changes in mental status. All other systems reviewed and are otherwise negative except as noted above.  Physical Exam    VS:  BP (!) 100/52 (BP Location: Left Arm, Patient Position: Sitting, Cuff Size: Normal)   Pulse 77   Temp (!) 97.2 F (36.2 C)   Ht 5\' 10"  (1.778 m)   Wt 221 lb (100.2 kg)   BMI 31.71 kg/m  , BMI Body mass index is 31.71  kg/m. GEN: Well nourished, well developed, in no acute distress. HEENT: normal. Neck: Supple, no JVD, carotid bruits, or masses. Cardiac: RRR, no murmurs,  rubs, or gallops. No clubbing, cyanosis, edema.  Radials/DP/PT 2+ and equal bilaterally.  Respiratory:  Respirations regular and unlabored, clear to auscultation bilaterally. GI: Soft, nontender, nondistended, BS + x 4. MS: no deformity or atrophy. Skin: warm and dry, no rash. Neuro:  Strength and sensation are intact. Psych: Normal affect.  Accessory Clinical Findings    ECG personally reviewed by me today-atrial fibrillation septal infarct undetermined age 37 bpm  EKG 11/23/2018 Atrial fibrillation septal infarct undetermined age 44 bpm  Echocardiogram 11/20/2014 Study Conclusions  - Left ventricle: The cavity size was normal. Wall thickness was increased in a pattern of moderate LVH. Systolic function was mildly reduced. The estimated ejection fraction was in the range of 45% to 50%. Inferiorlateral hypokinesis. Abnormal GLPSS at -11%, with inferolateral strain abnormality. The study is not technically sufficient to allow evaluation of LV diastolic function. - Aorta: Ascending aortic diameter: 40 mm (S). - Ascending aorta: The ascending aorta was mildly dilated. - Left atrium: The atrium was normal in size. - Right atrium: Severely dilated at 30 cm2. - Systemic veins: The IVC measures <2.1 cm, but does not collapse >50%, suggesting an elevated RA pressure of 8 mmHg.  Nuclear stress test 11/27/14:  There was no ST segment deviation noted during stress.  No T wave inversion was noted during stress.  Defect 1: There is a small defect of moderate severity.  Small size, moderate-intensity fixed inferolateral wall defect - could represent artifact or scar. The study was non-gated, therefore LVEF was not calculated and wall motion abnormalities could not be determined. Based on the lack of significant  reversible ischemia, this is a low risk study.  Carotid Doppler ultrasound  02/19/15: 1-39% ICA stenosis bilaterally  Assessment & Plan   1.  Atrial fibrillation-EKG today shows atrial fibrillation septal infarct undetermined age 63 bpm.  Previously contacted Dr. Oval Linsey with concern for DCCV.  Has been therapeutic on Coumadin since 02/12/2019.  CHA2DS2-VASc score 3.2% stroke rate/year from a score of 3 (CHF, hypertension, and monitor) Continue Coumadin Schedule DCCV Order BMP and CBC.  The patient understands that risks include but are not limited to stroke (1 in 1000), death (1 in 7), kidney failure [usually temporary] (1 in 500), bleeding (1 in 200), allergic reaction [possibly serious] (1 in 200), and agrees to proceed.   Coronary artery disease-no chest pain today.  Status post CABG 2006 and PCI with multiple stents with last cardiac catheter around 2006.  Nuclear stress test 11-16 showed fixed the fact, no ischemia. Continue losartan Continue carvedilol On warfarin-no ASA  Carotid stenosis-carotid ultrasound 02/19/2015 showed bilateral 1-39% ICA stenosis Continue Praluent  Essential hypertension-BP today110/52.  Well-controlled at home Continue losartan Continue carvedilol Heart healthy low-sodium diet-salty 6 given Increase physical activity as tolerated  Aortic aneurysm-no recent abdominal or back pain.  Previously evaluated 11/15 and 3/18 stable at 4.0 cm. Recommend repeat echocardiogram after DCCV. Continue to monitor.  Disposition: Follow-up with Dr. Oval Linsey after DCCV and echocardiogram.   Jossie Ng. [facility] [address] Office [phone] Fax [phone]

## 2019-04-17 NOTE — Progress Notes (Addendum)
Cardiology Clinic Note   Patient Name: Gary [name] Date of Encounter: 04/19/2019  Primary Care Provider:  [name], DO Primary Cardiologist:  Gary Latch, MD  Patient Profile    Gary [name] 74 year old male presents today for follow-up evaluation of his atrial fibrillation and evaluation for DCCV.  Past Medical History    Past Medical History:  Diagnosis Date  . Aortic aneurysm ([place]) 02/20/2016   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  . Arthritis   . Atrial fibrillation ([place]) 11/13/2014  . Cancer ([place])    skin  . Chronic combined systolic and diastolic heart failure ([place]) 02/20/2016   LVEF 45-50% 11/2014  . Coronary artery disease   . Depression   . Essential hypertension 11/13/2014  . Hyperlipidemia   . Hyperlipidemia 11/13/2014  . Hypertension   . Sleep apnea    CPAP 'Uses half the time"  last study 2009  . 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 CARDIOVERSION N/A 12/06/2014   Procedure: CARDIOVERSION;  Surgeon: Gary Latch, MD;  Location: [facility];  Service: Cardiovascular;  Laterality: N/A;  . CARDIOVERSION N/A 05/26/2017   Procedure: CARDIOVERSION;  Surgeon: Gary Latch, MD;  Location: [facility];  Service: Cardiovascular;  Laterality: N/A;  . CARPAL TUNNEL RELEASE    . CORONARY ANGIOPLASTY     multiple stents  . CORONARY ARTERY BYPASS GRAFT  2006  . EYE SURGERY     right eye-  muscular repair  . TOTAL HIP ARTHROPLASTY  01/03/2012   Procedure: TOTAL HIP ARTHROPLASTY;  Surgeon: [name], MD;  Location: [facility];  Service: Orthopedics;  Laterality: Right;    Allergies  No Known Allergies  History of Present Illness    [name] has a PMH of hypertension, coronary artery disease status post PCI and CABG, mild ascending aortic aneurysm, chronic systolic and diastolic CHF, hyperlipidemia, paroxysmal atrial fibrillation, and obstructive sleep apnea noncompliant  with CPAP.  He was first seen in November 2016 after being diagnosed with atrial fibrillation.  He underwent successful DCCV 12/16.  He had an echocardiogram 11/16 that showed an LVEF of 45-50% with inferolateral hypokinesis.  He was also noted to have an ascending aortic aneurysm that 4.0 cm.  His echocardiogram 03/2016 was unchanged from his previous study.  He had a [name] 11/16 that showed small fixed inferolateral defect.  He was noted to have carotid bruit and underwent carotid Dopplers 2/17.  His carotid Doppler showed bilateral 1-39% ICA stenosis.  He was last seen by Dr. Oval [name] on 11/23/2018.  During that time he had been feeling well.  He had recently been at the beach and tried to pick up a 50 pound bag of sand which he struggled with.  He had been losing weight and following the keto diet.  He stated he was limiting protein intake as well as his carbohydrates.  He continues to work in his yard and be busy around his house.  He denied any exertional chest pain or shortness of breath.  With colder weather he stated he would walk for exercise.  He indicated that he had not been sleeping well and was taking naps in the afternoon.  He had been feeling more tired than usual.  His blood pressures at home are ranging in the 100s over 70s.  His heart rate has always been less than 100 bpm.  He denied palpitations, lightheadedness, and dizziness.  At that time he requested to switch to  warfarin due to cost.  At the time of his last visit he was in atrial fibrillation and was completely asymptomatic.  No plans were made to attempt repeat cardioversion.  On 03/30/2019 he presented for follow-up at the Coumadin clinic with [facility].D. and indicated that he had spoken with Dr. Oval [name] about possible DCCV.  Dr. Oval [name] agreed and stated that he would have to have therapeutic INR for greater than 3 weeks.  He presents to the clinic today for follow-up evaluation and states he has not been  as active through the fall and winter.  He is now wanting to garden and increase his physical activity.  He feels he is not able to do these type of activities due to fatigue.  He is typically able to perform 1 to 2 hours of physical activity before being tired the rest of the day.  He has been therapeutic with his Coumadin for several weeks and has not missed any doses.  He asks about triggers for atrial fibrillation and this was discussed.  He states he would like to proceed with the procedure one more time.  I will schedule him for DCCV and have him follow-up after the procedure.  Today he denies chest pain, shortness of breath, lower extremity edema, palpitations, melena, hematuria, hemoptysis, diaphoresis, weakness, presyncope, syncope, orthopnea, and PND.   Home Medications    Prior to Admission medications   Medication Sig Start Date End Date Taking? Authorizing Provider  Alirocumab (PRALUENT) 150 MG/ML SOAJ Inject 150 mg into the skin every 14 (fourteen) days. 12/20/18   Gary Latch, MD  ALPRAZolam Duanne Moron) 1 MG tablet Take 0.5 mg by mouth at bedtime. 05/09/17   [provider]  amLODipine (NORVASC) 2.5 MG tablet Take 1 tablet (2.5 mg total) by mouth daily. 02/26/19   Gary Latch, MD  carvedilol (COREG) 25 MG tablet Take 1 tablet (25 mg total) by mouth 2 (two) times daily. 02/26/19   Gary Latch, MD  Coenzyme Q10 (CO Q 10 PO) Take 1 capsule by mouth daily.    [provider]  hydrochlorothiazide (HYDRODIURIL) 12.5 MG tablet TAKE 1 TABLET BY MOUTH EVERY DAY 04/16/19   Gary Latch, MD  levothyroxine (SYNTHROID, LEVOTHROID) 137 MCG tablet Take 137-205.5 mcg by mouth See admin instructions. Take 205.5 mcg by mouth on Wednesday. Take 137 mcg by mouth daily on all other days 10/27/14   [provider]  losartan (COZAAR) 100 MG tablet Take 1 tablet (100 mg total) by mouth daily. 07/14/18 10/12/18  Gary Latch, MD  metFORMIN (GLUCOPHAGE) 500 MG tablet  Take 500 mg by mouth 2 (two) times daily. 10/27/17   [provider]  Multiple Vitamin (MULTIVITAMIN WITH MINERALS) TABS tablet Take 1 tablet by mouth daily.     [provider]  riTUXimab (RITUXAN IV) Inject into the vein.    [provider]  sodium chloride (OCEAN) 0.65 % SOLN nasal spray Place 1 spray into both nostrils at bedtime.     [provider]  sulfamethoxazole-trimethoprim (BACTRIM,SEPTRA) 400-80 MG tablet Take 1 tablet by mouth daily.    [provider]  testosterone cypionate (DEPOTESTOSTERONE CYPIONATE) 200 MG/ML injection Inject 200 mg into the muscle every 14 (fourteen) days. 10/09/14   [provider]  venlafaxine XR (EFFEXOR-XR) 150 MG 24 hr capsule Take 150 mg by mouth every morning.     [provider]  warfarin (COUMADIN) 5 MG tablet TAKE 1 TABLET BY MOUTH EVERY DAY 03/20/19   Gary Latch, MD  Family History    Family History  Problem Relation Age of Onset  . Heart attack Mother   . Other Father        odd stomach disorder  . Stroke Maternal Grandmother   . Heart attack Maternal Grandfather   . Heart attack Paternal Grandmother   . Heart attack Paternal Grandfather    He indicated that his mother is deceased. He indicated that his father is deceased. He indicated that his brother is deceased. He indicated that his maternal grandmother is deceased. He indicated that his maternal grandfather is deceased. He indicated that his paternal grandmother is deceased. He indicated that his paternal grandfather is deceased.  Social History    Social History   Socioeconomic History  . Marital status: Married    Spouse name: Not on file  . Number of children: Not on file  . Years of education: Not on file  . Highest education level: Not on file  Occupational History  . Not on file  Tobacco Use  . Smoking status: Former Smoker    Types: Cigarettes    Quit date: 12/24/1983    Years since quitting: 35.3    . Smokeless tobacco: Never Used  Substance and Sexual Activity  . Alcohol use: No    Alcohol/week: 0.0 standard drinks  . Drug use: No  . Sexual activity: Not on file  Other Topics Concern  . Not on file  Social History Narrative  . Not on file   Social Determinants of Health   Financial Resource Strain:   . Difficulty of Paying Living Expenses:   Food Insecurity:   . Worried About Charity fundraiser in the Last Year:   . Arboriculturist in the Last Year:   Transportation Needs:   . Film/video editor (Medical):   Marland Kitchen Lack of Transportation (Non-Medical):   Physical Activity:   . Days of Exercise per Week:   . Minutes of Exercise per Session:   Stress:   . Feeling of Stress :   Social Connections:   . Frequency of Communication with Friends and Family:   . Frequency of Social Gatherings with Friends and Family:   . Attends Religious Services:   . Active Member of Clubs or Organizations:   . Attends Archivist Meetings:   Marland Kitchen Marital Status:   Intimate Partner Violence:   . Fear of Current or Ex-Partner:   . Emotionally Abused:   Marland Kitchen Physically Abused:   . Sexually Abused:      Review of Systems    General:  No chills, fever, night sweats or weight changes.  Cardiovascular:  No chest pain, dyspnea on exertion, edema, orthopnea, palpitations, paroxysmal nocturnal dyspnea. Dermatological: No rash, lesions/masses Respiratory: No cough, dyspnea Urologic: No hematuria, dysuria Abdominal:   No nausea, vomiting, diarrhea, bright red blood per rectum, melena, or hematemesis Neurologic:  No visual changes, wkns, changes in mental status. All other systems reviewed and are otherwise negative except as noted above.  Physical Exam    VS:  BP (!) 100/52 (BP Location: Left Arm, Patient Position: Sitting, Cuff Size: Normal)   Pulse 77   Temp (!) 97.2 F (36.2 C)   Ht 5\' 10"  (1.778 m)   Wt 221 lb (100.2 kg)   BMI 31.71 kg/m  , BMI Body mass index is 31.71  kg/m. GEN: Well nourished, well developed, in no acute distress. HEENT: normal. Neck: Supple, no JVD, carotid bruits, or masses. Cardiac: RRR, no murmurs,  rubs, or gallops. No clubbing, cyanosis, edema.  Radials/DP/PT 2+ and equal bilaterally.  Respiratory:  Respirations regular and unlabored, clear to auscultation bilaterally. GI: Soft, nontender, nondistended, BS + x 4. MS: no deformity or atrophy. Skin: warm and dry, no rash. Neuro:  Strength and sensation are intact. Psych: Normal affect.  Accessory Clinical Findings    ECG personally reviewed by me today-atrial fibrillation septal infarct undetermined age 77 bpm  EKG 11/23/2018 Atrial fibrillation septal infarct undetermined age 36 bpm  Echocardiogram 11/20/2014 Study Conclusions  - Left ventricle: The cavity size was normal. Wall thickness was increased in a pattern of moderate LVH. Systolic function was mildly reduced. The estimated ejection fraction was in the range of 45% to 50%. Inferiorlateral hypokinesis. Abnormal GLPSS at -11%, with inferolateral strain abnormality. The study is not technically sufficient to allow evaluation of LV diastolic function. - Aorta: Ascending aortic diameter: 40 mm (S). - Ascending aorta: The ascending aorta was mildly dilated. - Left atrium: The atrium was normal in size. - Right atrium: Severely dilated at 30 cm2. - Systemic veins: The IVC measures <2.1 cm, but does not collapse >50%, suggesting an elevated RA pressure of 8 mmHg.  Nuclear stress test 11/27/14:  There was no ST segment deviation noted during stress.  No T wave inversion was noted during stress.  Defect 1: There is a small defect of moderate severity.  Small size, moderate-intensity fixed inferolateral wall defect - could represent artifact or scar. The study was non-gated, therefore LVEF was not calculated and wall motion abnormalities could not be determined. Based on the lack of significant  reversible ischemia, this is a low risk study.  Carotid Doppler ultrasound  02/19/15: 1-39% ICA stenosis bilaterally  Assessment & Plan   1.  Atrial fibrillation-EKG today shows atrial fibrillation septal infarct undetermined age 54 bpm.  Previously contacted Dr. Oval [name] with concern for DCCV.  Has been therapeutic on Coumadin since 02/12/2019.  CHA2DS2-VASc score 3.2% stroke rate/year from a score of 3 (CHF, hypertension, and monitor) Continue Coumadin Schedule DCCV Order BMP and CBC.  The patient understands that risks include but are not limited to stroke (1 in 1000), death (1 in 49), kidney failure [usually temporary] (1 in 500), bleeding (1 in 200), allergic reaction [possibly serious] (1 in 200), and agrees to proceed.   Coronary artery disease-no chest pain today.  Status post CABG 2006 and PCI with multiple stents with last cardiac catheter around 2006.  Nuclear stress test 11-16 showed fixed the fact, no ischemia. Continue losartan Continue carvedilol On warfarin-no ASA  Carotid stenosis-carotid ultrasound 02/19/2015 showed bilateral 1-39% ICA stenosis Continue Praluent  Essential hypertension-BP today110/52.  Well-controlled at home Continue losartan Continue carvedilol Heart healthy low-sodium diet-salty 6 given Increase physical activity as tolerated  Aortic aneurysm-no recent abdominal or back pain.  Previously evaluated 11/15 and 3/18 stable at 4.0 cm. Recommend repeat echocardiogram after DCCV. Continue to monitor.  Disposition: Follow-up with Dr. Oval [name] after DCCV and echocardiogram.   Jossie Ng. [facility] [address] Office [phone] Fax [phone]

## 2019-04-19 ENCOUNTER — Ambulatory Visit (INDEPENDENT_AMBULATORY_CARE_PROVIDER_SITE_OTHER): Payer: Medicare Other | Admitting: General Practice

## 2019-04-19 ENCOUNTER — Other Ambulatory Visit: Payer: Self-pay

## 2019-04-19 ENCOUNTER — Encounter: Payer: Self-pay | Admitting: General Practice

## 2019-04-19 VITALS — BP 100/52 | HR 77 | Temp 97.2°F | Ht 70.0 in | Wt 221.0 lb

## 2019-04-19 DIAGNOSIS — I2510 Atherosclerotic heart disease of native coronary artery without angina pectoris: Secondary | ICD-10-CM | POA: Diagnosis not present

## 2019-04-19 DIAGNOSIS — I10 Essential (primary) hypertension: Secondary | ICD-10-CM | POA: Diagnosis not present

## 2019-04-19 DIAGNOSIS — I480 Paroxysmal atrial fibrillation: Secondary | ICD-10-CM | POA: Diagnosis not present

## 2019-04-19 DIAGNOSIS — I712 Thoracic aortic aneurysm, without rupture: Secondary | ICD-10-CM | POA: Diagnosis not present

## 2019-04-19 DIAGNOSIS — I6523 Occlusion and stenosis of bilateral carotid arteries: Secondary | ICD-10-CM | POA: Diagnosis not present

## 2019-04-19 DIAGNOSIS — Z79899 Other long term (current) drug therapy: Secondary | ICD-10-CM

## 2019-04-19 DIAGNOSIS — I7121 Aneurysm of the ascending aorta, without rupture: Secondary | ICD-10-CM

## 2019-04-19 NOTE — Addendum Note (Signed)
Addended by: Deberah Pelton on: 04/19/2019 11:57 AM   Modules accepted: Orders

## 2019-04-19 NOTE — Patient Instructions (Signed)
Special Instructions LAB TODAY BMET AND CBC TODAY.  Please try to avoid these triggers:  Do notuse any products that have nicotine or tobacco in them. These include cigarettes, e-cigarettes, and chewing tobacco. If you need help quitting, ask your doctor.  Eat heart-healthy foods. Talk with your doctor about the right eating plan for you.  Exercise regularly as told by your doctor.  Do notdrink alcohol, Caffeine or chocolate.  Lose weight if you are overweight.  Do notuse drugs, including cannabis  PLEASE READ AND FOLLOW SALTY 6 ATTACHED  Follow up: [place], FNP-C ON 05-11-2019 @ 1145AM.      Dear Clance Boll T. Normon Radford are scheduled for a Cardioversion on 04-25-2019 with Dr. Sallyanne Kuster.  Please arrive at the [facility] (Main Entrance A) at [facility]: [address] [place], [place] [address] at [place]. (1 hour prior to procedure unless lab work is needed; if lab work is needed arrive 1.5 hours ahead)  DIET: Nothing to eat or drink after midnight except a sip of water with medications (see medication instructions below)  Medication Instructions: Continue your anticoagulant: You will need to continue your anticoagulant after your procedure until you are told by your Provider that it is safe to stop.   Labs: TODAY 04-19-2019  You must have a responsible person to drive you home and stay in the waiting area during your procedure. Failure to do so could result in cancellation.  Bring your insurance cards.  *Special Note: Every effort is made to have your procedure done on time. Occasionally there are emergencies that occur at the hospital that may cause delays. Please be patient if a delay does occur.

## 2019-04-20 LAB — BASIC METABOLIC PANEL
BUN/Creatinine Ratio: 33 — ABNORMAL HIGH (ref 10–24)
BUN: 26 mg/dL (ref 8–27)
CO2: 25 mmol/L (ref 20–29)
Calcium: 9.1 mg/dL (ref 8.6–10.2)
Chloride: 103 mmol/L (ref 96–106)
Creatinine, Ser: 0.79 mg/dL (ref 0.76–1.27)
GFR calc Af Amer: 101 mL/min/{1.73_m2} (ref >59)
GFR calc non Af Amer: 87 mL/min/{1.73_m2} (ref >59)
Glucose: 89 mg/dL (ref 65–99)
Potassium: 4.8 mmol/L (ref 3.5–5.2)
Sodium: 141 mmol/L (ref 134–144)

## 2019-04-20 LAB — CBC
Hematocrit: 42.9 % (ref 37.5–51.0)
Hemoglobin: 14.3 g/dL (ref 13.0–17.7)
MCH: 27.4 pg (ref 26.6–33.0)
MCHC: 33.3 g/dL (ref 31.5–35.7)
MCV: 82 fL (ref 79–97)
Platelets: 374 10*3/uL (ref 150–450)
RBC: 5.22 x10E6/uL (ref 4.14–5.80)
RDW: 14.4 % (ref 11.6–15.4)
WBC: 8.9 10*3/uL (ref 3.4–10.8)

## 2019-04-21 ENCOUNTER — Other Ambulatory Visit (HOSPITAL_COMMUNITY)
Admission: RE | Admit: 2019-04-21 | Discharge: 2019-04-21 | Disposition: A | Discharge status: Home / Self Care | Payer: Medicare Other | Source: Ambulatory Visit | Attending: Cardiovascular Disease | Admitting: Cardiovascular Disease

## 2019-04-21 DIAGNOSIS — Z20822 Contact with and (suspected) exposure to covid-19: Secondary | ICD-10-CM | POA: Diagnosis not present

## 2019-04-21 DIAGNOSIS — Z01812 Encounter for preprocedural laboratory examination: Secondary | ICD-10-CM | POA: Diagnosis not present

## 2019-04-21 LAB — SARS CORONAVIRUS 2 (TAT 6-24 HRS): SARS Coronavirus 2: NEGATIVE

## 2019-04-22 ENCOUNTER — Other Ambulatory Visit: Payer: Self-pay | Admitting: Cardiovascular Disease

## 2019-04-24 NOTE — Anesthesia Preprocedure Evaluation (Addendum)
Anesthesia Evaluation  Patient identified by MRN, date of birth, ID band Patient awake    Reviewed: Allergy & Precautions, NPO status , Patient's Chart, lab work & pertinent test results, reviewed documented beta blocker date and time   Airway Mallampati: II  TM Distance: >3 FB Neck ROM: Full    Dental  (+) Teeth Intact, Dental Advisory Given   Pulmonary sleep apnea and Continuous Positive Airway Pressure Ventilation , former smoker,    Pulmonary exam normal breath sounds clear to auscultation       Cardiovascular hypertension, Pt. on medications and Pt. on home beta blockers + CAD, + Peripheral Vascular Disease and +CHF  + dysrhythmias Atrial Fibrillation  Rhythm:Irregular Rate:Abnormal     Neuro/Psych PSYCHIATRIC DISORDERS Depression negative neurological ROS     GI/Hepatic negative GI ROS, Neg liver ROS,   Endo/Other  diabetes, Type 2, Oral Hypoglycemic AgentsHypothyroidism Obesity   Renal/GU negative Renal ROS     Musculoskeletal  (+) Arthritis ,   Abdominal   Peds  Hematology  (+) Blood dyscrasia (Warfarin), ,   Anesthesia Other Findings Day of surgery medications reviewed with the patient.  Reproductive/Obstetrics                           Anesthesia Physical Anesthesia Plan  ASA: III  Anesthesia Plan: General   Post-op Pain Management:    Induction: Intravenous  PONV Risk Score and Plan: 2 and Propofol infusion and Treatment may vary due to age or medical condition  Airway Management Planned: Mask  Additional Equipment:   Intra-op Plan:   Post-operative Plan:   Informed Consent: I have reviewed the patients History and Physical, chart, labs and discussed the procedure including the risks, benefits and alternatives for the proposed anesthesia with the patient or authorized representative who has indicated his/her understanding and acceptance.     Dental advisory  given  Plan Discussed with: CRNA  Anesthesia Plan Comments:        Anesthesia Quick Evaluation

## 2019-04-25 ENCOUNTER — Ambulatory Visit (HOSPITAL_COMMUNITY)
Admission: RE | Admit: 2019-04-25 | Discharge: 2019-04-25 | Disposition: A | Discharge status: Home / Self Care | Payer: Medicare Other | Attending: Cardiovascular Disease | Admitting: Cardiovascular Disease

## 2019-04-25 ENCOUNTER — Ambulatory Visit (HOSPITAL_COMMUNITY): Payer: Medicare Other | Admitting: Anesthesiology

## 2019-04-25 ENCOUNTER — Other Ambulatory Visit: Payer: Self-pay

## 2019-04-25 ENCOUNTER — Encounter (HOSPITAL_COMMUNITY)
Admission: RE | Disposition: A | Discharge status: Home / Self Care | Payer: Self-pay | Source: Home / Self Care | Attending: Cardiovascular Disease

## 2019-04-25 DIAGNOSIS — I6523 Occlusion and stenosis of bilateral carotid arteries: Secondary | ICD-10-CM | POA: Insufficient documentation

## 2019-04-25 DIAGNOSIS — E785 Hyperlipidemia, unspecified: Secondary | ICD-10-CM | POA: Insufficient documentation

## 2019-04-25 DIAGNOSIS — I4891 Unspecified atrial fibrillation: Secondary | ICD-10-CM

## 2019-04-25 DIAGNOSIS — Z7984 Long term (current) use of oral hypoglycemic drugs: Secondary | ICD-10-CM | POA: Diagnosis not present

## 2019-04-25 DIAGNOSIS — Z9119 Patient's noncompliance with other medical treatment and regimen: Secondary | ICD-10-CM | POA: Diagnosis not present

## 2019-04-25 DIAGNOSIS — Z8249 Family history of ischemic heart disease and other diseases of the circulatory system: Secondary | ICD-10-CM | POA: Insufficient documentation

## 2019-04-25 DIAGNOSIS — Z7901 Long term (current) use of anticoagulants: Secondary | ICD-10-CM | POA: Diagnosis not present

## 2019-04-25 DIAGNOSIS — M1990 Unspecified osteoarthritis, unspecified site: Secondary | ICD-10-CM | POA: Diagnosis not present

## 2019-04-25 DIAGNOSIS — Z96641 Presence of right artificial hip joint: Secondary | ICD-10-CM | POA: Diagnosis not present

## 2019-04-25 DIAGNOSIS — I4819 Other persistent atrial fibrillation: Secondary | ICD-10-CM

## 2019-04-25 DIAGNOSIS — I110 Hypertensive heart disease with heart failure: Secondary | ICD-10-CM | POA: Diagnosis not present

## 2019-04-25 DIAGNOSIS — G4733 Obstructive sleep apnea (adult) (pediatric): Secondary | ICD-10-CM | POA: Diagnosis not present

## 2019-04-25 DIAGNOSIS — Z951 Presence of aortocoronary bypass graft: Secondary | ICD-10-CM | POA: Diagnosis not present

## 2019-04-25 DIAGNOSIS — I712 Thoracic aortic aneurysm, without rupture: Secondary | ICD-10-CM | POA: Diagnosis not present

## 2019-04-25 DIAGNOSIS — I2510 Atherosclerotic heart disease of native coronary artery without angina pectoris: Secondary | ICD-10-CM | POA: Diagnosis not present

## 2019-04-25 DIAGNOSIS — I5042 Chronic combined systolic (congestive) and diastolic (congestive) heart failure: Secondary | ICD-10-CM | POA: Insufficient documentation

## 2019-04-25 DIAGNOSIS — F329 Major depressive disorder, single episode, unspecified: Secondary | ICD-10-CM | POA: Insufficient documentation

## 2019-04-25 DIAGNOSIS — Z79899 Other long term (current) drug therapy: Secondary | ICD-10-CM | POA: Diagnosis not present

## 2019-04-25 DIAGNOSIS — I480 Paroxysmal atrial fibrillation: Secondary | ICD-10-CM | POA: Diagnosis not present

## 2019-04-25 DIAGNOSIS — Z87891 Personal history of nicotine dependence: Secondary | ICD-10-CM | POA: Insufficient documentation

## 2019-04-25 DIAGNOSIS — Z79890 Hormone replacement therapy (postmenopausal): Secondary | ICD-10-CM | POA: Insufficient documentation

## 2019-04-25 DIAGNOSIS — Z955 Presence of coronary angioplasty implant and graft: Secondary | ICD-10-CM | POA: Insufficient documentation

## 2019-04-25 HISTORY — PX: CARDIOVERSION: SHX1299

## 2019-04-25 LAB — PROTIME-INR
INR: 2.3 — ABNORMAL HIGH (ref 0.8–1.2)
Prothrombin Time: 24.9 seconds — ABNORMAL HIGH (ref 11.4–15.2)

## 2019-04-25 SURGERY — CARDIOVERSION
Anesthesia: General

## 2019-04-25 MED ORDER — SODIUM CHLORIDE 0.9 % IV SOLN
INTRAVENOUS | Status: AC | PRN
  Administered 2019-04-25: 500 mL via INTRAMUSCULAR

## 2019-04-25 MED ORDER — LIDOCAINE 2% (20 MG/ML) 5 ML SYRINGE
INTRAMUSCULAR | Status: DC | PRN
  Administered 2019-04-25: 60 mg via INTRAVENOUS

## 2019-04-25 MED ORDER — PROPOFOL 10 MG/ML IV BOLUS
INTRAVENOUS | Status: DC | PRN
  Administered 2019-04-25: 50 mg via INTRAVENOUS
  Administered 2019-04-25: 15 mg via INTRAVENOUS

## 2019-04-25 NOTE — Op Note (Signed)
Procedure: Electrical Cardioversion Indications:  Atrial Fibrillation  Procedure Details:  Consent: Risks of procedure as well as the alternatives and risks of each were explained to the (patient/caregiver).  Consent for procedure obtained.  Time Out: Verified patient identification, verified procedure, site/side was marked, verified correct patient position, special equipment/implants available, medications/allergies/relevent history reviewed, required imaging and test results available.  Performed  Patient placed on cardiac monitor, pulse oximetry, supplemental oxygen as necessary.  Sedation given: Propofol 65 mg IV, Dr. Gifford Shave, Anesthesiology Pacer pads placed anterior and posterior chest.  Cardioverted 1 time(s).  Cardioversion with synchronized biphasic 120J shock.  Evaluation: Findings: Post procedure EKG shows: NSR Complications: None Patient did tolerate procedure well.  Time Spent Directly with the Patient:  30 minutes   Gary [name] 04/25/2019, 9:25 AM

## 2019-04-25 NOTE — Transfer of Care (Signed)
Immediate Anesthesia Transfer of Care Note  Patient: Gary [name]  Procedure(s) Performed: CARDIOVERSION (N/A )  Patient Location: Endoscopy Unit  Anesthesia Type:General  Level of Consciousness: awake, alert  and oriented  Airway & Oxygen Therapy: Patient Spontanous Breathing and Patient connected to nasal cannula oxygen  Post-op Assessment: Report given to RN, Post -op Vital signs reviewed and stable and Patient moving all extremities  Post vital signs: Reviewed and stable  Last Vitals:  Vitals Value Taken Time  BP 103/53 04/25/19 0935  Temp 36.4 C 04/25/19 0924  Pulse 62 04/25/19 0935  Resp 14 04/25/19 0935  SpO2 98 % 04/25/19 0935    Last Pain:  Vitals:   04/25/19 0935  TempSrc:   PainSc: 0-No pain         Complications: No apparent anesthesia complications

## 2019-04-25 NOTE — Anesthesia Procedure Notes (Signed)
Procedure Name: MAC Date/Time: 04/25/2019 9:20 AM Performed by: Amadeo Garnet, CRNA Pre-anesthesia Checklist: Patient identified, Emergency Drugs available, Suction available and Patient being monitored Patient Re-evaluated:Patient Re-evaluated prior to induction Oxygen Delivery Method: Ambu bag Preoxygenation: Pre-oxygenation with 100% oxygen Induction Type: IV induction Placement Confirmation: positive ETCO2 Dental Injury: Teeth and Oropharynx as per pre-operative assessment

## 2019-04-25 NOTE — Anesthesia Postprocedure Evaluation (Signed)
Anesthesia Post Note  Patient: Gary [name]  Procedure(s) Performed: CARDIOVERSION (N/A )     Patient location during evaluation: Endoscopy Anesthesia Type: General Level of consciousness: awake and alert Pain management: pain level controlled Vital Signs Assessment: post-procedure vital signs reviewed and stable Respiratory status: spontaneous breathing, nonlabored ventilation, respiratory function stable and patient connected to nasal cannula oxygen Cardiovascular status: blood pressure returned to baseline and stable Postop Assessment: no apparent nausea or vomiting Anesthetic complications: no    Last Vitals:  Vitals:   04/25/19 0924 04/25/19 0935  BP: 123/64 (!) 103/53  Pulse: 60 62  Resp: 16 14  Temp: 36.4 C   SpO2: 100% 98%    Last Pain:  Vitals:   04/25/19 0935  TempSrc:   PainSc: 0-No pain                 [name]

## 2019-04-25 NOTE — Interval H&P Note (Signed)
History and Physical Interval Note:  04/25/2019 8:15 AM  Gary [name]  has presented today for surgery, with the diagnosis of A-FIB.  The various methods of treatment have been discussed with the patient and family. After consideration of risks, benefits and other options for treatment, the patient has consented to  Procedure(s): CARDIOVERSION (N/A) as a surgical intervention.  The patient's history has been reviewed, patient examined, no change in status, stable for surgery.  I have reviewed the patient's chart and labs.  Questions were answered to the patient's satisfaction.     Corday Wyka

## 2019-04-26 DIAGNOSIS — I719 Aortic aneurysm of unspecified site, without rupture: Secondary | ICD-10-CM | POA: Diagnosis not present

## 2019-04-26 DIAGNOSIS — I4891 Unspecified atrial fibrillation: Secondary | ICD-10-CM | POA: Diagnosis not present

## 2019-04-26 DIAGNOSIS — E291 Testicular hypofunction: Secondary | ICD-10-CM | POA: Diagnosis not present

## 2019-04-26 DIAGNOSIS — E038 Other specified hypothyroidism: Secondary | ICD-10-CM | POA: Diagnosis not present

## 2019-04-26 DIAGNOSIS — M3130 Wegener's granulomatosis without renal involvement: Secondary | ICD-10-CM | POA: Diagnosis not present

## 2019-04-26 DIAGNOSIS — I10 Essential (primary) hypertension: Secondary | ICD-10-CM | POA: Diagnosis not present

## 2019-04-26 DIAGNOSIS — G4733 Obstructive sleep apnea (adult) (pediatric): Secondary | ICD-10-CM | POA: Diagnosis not present

## 2019-04-26 DIAGNOSIS — I25729 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: Secondary | ICD-10-CM | POA: Diagnosis not present

## 2019-04-27 ENCOUNTER — Other Ambulatory Visit: Payer: Self-pay

## 2019-04-27 ENCOUNTER — Encounter: Payer: Self-pay | Admitting: *Deleted

## 2019-04-27 ENCOUNTER — Ambulatory Visit (HOSPITAL_COMMUNITY)
Admission: RE | Admit: 2019-04-27 | Discharge: 2019-04-27 | Disposition: A | Discharge status: Home / Self Care | Payer: Medicare Other | Source: Ambulatory Visit | Attending: Cardiovascular Disease | Admitting: Cardiovascular Disease

## 2019-04-27 DIAGNOSIS — I6523 Occlusion and stenosis of bilateral carotid arteries: Secondary | ICD-10-CM

## 2019-05-04 ENCOUNTER — Ambulatory Visit (INDEPENDENT_AMBULATORY_CARE_PROVIDER_SITE_OTHER): Payer: Medicare Other | Admitting: Pharmacist

## 2019-05-04 ENCOUNTER — Other Ambulatory Visit: Payer: Self-pay

## 2019-05-04 DIAGNOSIS — I480 Paroxysmal atrial fibrillation: Secondary | ICD-10-CM | POA: Diagnosis not present

## 2019-05-04 DIAGNOSIS — Z7901 Long term (current) use of anticoagulants: Secondary | ICD-10-CM

## 2019-05-04 LAB — POCT INR: INR: 2.8 (ref 2.0–3.0)

## 2019-05-07 DIAGNOSIS — E291 Testicular hypofunction: Secondary | ICD-10-CM | POA: Diagnosis not present

## 2019-05-07 DIAGNOSIS — I2510 Atherosclerotic heart disease of native coronary artery without angina pectoris: Secondary | ICD-10-CM | POA: Diagnosis not present

## 2019-05-07 DIAGNOSIS — E039 Hypothyroidism, unspecified: Secondary | ICD-10-CM | POA: Diagnosis not present

## 2019-05-10 DIAGNOSIS — Z6831 Body mass index (BMI) 31.0-31.9, adult: Secondary | ICD-10-CM | POA: Diagnosis not present

## 2019-05-10 DIAGNOSIS — M3130 Wegener's granulomatosis without renal involvement: Secondary | ICD-10-CM | POA: Diagnosis not present

## 2019-05-10 DIAGNOSIS — E119 Type 2 diabetes mellitus without complications: Secondary | ICD-10-CM | POA: Diagnosis not present

## 2019-05-10 DIAGNOSIS — I2510 Atherosclerotic heart disease of native coronary artery without angina pectoris: Secondary | ICD-10-CM | POA: Diagnosis not present

## 2019-05-10 DIAGNOSIS — E039 Hypothyroidism, unspecified: Secondary | ICD-10-CM | POA: Diagnosis not present

## 2019-05-10 DIAGNOSIS — I480 Paroxysmal atrial fibrillation: Secondary | ICD-10-CM | POA: Diagnosis not present

## 2019-05-10 DIAGNOSIS — E291 Testicular hypofunction: Secondary | ICD-10-CM | POA: Diagnosis not present

## 2019-05-18 ENCOUNTER — Other Ambulatory Visit: Payer: Self-pay | Admitting: Cardiovascular Disease

## 2019-06-01 ENCOUNTER — Ambulatory Visit (INDEPENDENT_AMBULATORY_CARE_PROVIDER_SITE_OTHER): Payer: Medicare Other | Admitting: Pharmacist

## 2019-06-01 ENCOUNTER — Other Ambulatory Visit: Payer: Self-pay

## 2019-06-01 DIAGNOSIS — Z7901 Long term (current) use of anticoagulants: Secondary | ICD-10-CM

## 2019-06-01 DIAGNOSIS — I480 Paroxysmal atrial fibrillation: Secondary | ICD-10-CM | POA: Diagnosis not present

## 2019-06-01 DIAGNOSIS — I4819 Other persistent atrial fibrillation: Secondary | ICD-10-CM | POA: Diagnosis not present

## 2019-06-01 LAB — POCT INR: INR: 2.9 (ref 2.0–3.0)

## 2019-06-07 DIAGNOSIS — Z6832 Body mass index (BMI) 32.0-32.9, adult: Secondary | ICD-10-CM | POA: Diagnosis not present

## 2019-06-07 DIAGNOSIS — Z7952 Long term (current) use of systemic steroids: Secondary | ICD-10-CM | POA: Diagnosis not present

## 2019-06-07 DIAGNOSIS — E669 Obesity, unspecified: Secondary | ICD-10-CM | POA: Diagnosis not present

## 2019-06-07 DIAGNOSIS — M6281 Muscle weakness (generalized): Secondary | ICD-10-CM | POA: Diagnosis not present

## 2019-06-07 DIAGNOSIS — M3130 Wegener's granulomatosis without renal involvement: Secondary | ICD-10-CM | POA: Diagnosis not present

## 2019-06-10 NOTE — Progress Notes (Signed)
Cardiology Clinic Note   Patient Name: Gary [name] Date of Encounter: 06/11/2019  Primary Care Provider:  [name], DO Primary Cardiologist:  [name], MD  Patient Profile    Gary [name] 74 year old male presents today for follow-up evaluation of his atrial fibrillation and evaluation for DCCV.  Past Medical History    Past Medical History:  Diagnosis Date  . Aortic aneurysm ([place]) 02/20/2016   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  . Arthritis   . Atrial fibrillation ([place]) 11/13/2014  . Cancer ([place])    skin  . Chronic combined systolic and diastolic heart failure ([place]) 02/20/2016   LVEF 45-50% 11/2014  . Coronary artery disease   . Depression   . Essential hypertension 11/13/2014  . Hyperlipidemia   . Hyperlipidemia 11/13/2014  . Hypertension   . Sleep apnea    CPAP 'Uses half the time"  last study 2009  . 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 CARDIOVERSION N/A 12/06/2014   Procedure: CARDIOVERSION;  Surgeon: [name], MD;  Location: [facility];  Service: Cardiovascular;  Laterality: N/A;  . CARDIOVERSION N/A 05/26/2017   Procedure: CARDIOVERSION;  Surgeon: [name], MD;  Location: [facility] ENDOSCOPY;  Service: Cardiovascular;  Laterality: N/A;  . CARDIOVERSION N/A 04/25/2019   Procedure: CARDIOVERSION;  Surgeon: Sanda Klein, MD;  Location: [facility] ENDOSCOPY;  Service: Cardiovascular;  Laterality: N/A;  . CARPAL TUNNEL RELEASE    . CORONARY ANGIOPLASTY     multiple stents  . CORONARY ARTERY BYPASS GRAFT  2006  . EYE SURGERY     right eye-  muscular repair  . TOTAL HIP ARTHROPLASTY  01/03/2012   Procedure: TOTAL HIP ARTHROPLASTY;  Surgeon: [name], MD;  Location: [facility] ORS;  Service: Orthopedics;  Laterality: Right;    Allergies  Allergies  Allergen Reactions  . Statins Other (See Comments)    Muscle pain    History of Present Illness   [name] has a PMH of  hypertension, coronary artery disease status post PCI and CABG, mild ascending aortic aneurysm, chronic systolic and diastolic CHF, hyperlipidemia, paroxysmal atrial fibrillation, and obstructive sleep apnea noncompliant with CPAP.  He was first seen in November 2016 after being diagnosed with atrial fibrillation.  He underwent successful DCCV 12/16.  He had an echocardiogram 11/16 that showed an LVEF of 45-50% with inferolateral hypokinesis.  He was also noted to have an ascending aortic aneurysm that 4.0 cm.  His echocardiogram 03/2016 was unchanged from his previous study.  He had a [employer] 11/16 that showed small fixed inferolateral defect.  He was noted to have carotid bruit and underwent carotid Dopplers 2/17.  His carotid Doppler showed bilateral 1-39% ICA stenosis.  He was last seen by Dr. Oval Linsey on 11/23/2018.  During that time he had been feeling well.  He had recently been at the beach and tried to pick up a 50 pound bag of sand which he struggled with.  He had been losing weight and following the keto diet.  He stated he was limiting protein intake as well as his carbohydrates.  He continues to work in his yard and be busy around his house.  He denied any exertional chest pain or shortness of breath.  With colder weather he stated he would walk for exercise.  He indicated that he had not been sleeping well and was taking naps in the afternoon.  He had been feeling more tired than usual.  His blood  pressures at home are ranging in the 100s over 70s.  His heart rate has always been less than 100 bpm.  He denied palpitations, lightheadedness, and dizziness.  At that time he requested to switch to warfarin due to cost.  At the time of his last visit he was in atrial fibrillation and was completely asymptomatic.  No plans were made to attempt repeat cardioversion.  On 03/30/2019 he presented for follow-up at the Coumadin clinic with [facility].D. and indicated that he had spoken with  Dr. Oval Linsey about possible DCCV.  Dr. Oval Linsey agreed and stated that he would have to have therapeutic INR for greater than 3 weeks.  He presented to the clinic 04/19/19 for follow-up evaluation and stated he had not been as active through the fall and winter.  He was  wanting to garden and increase his physical activity.  He felt he was not able to do these type of activities due to fatigue.  He was typically able to perform 1 to 2 hours of physical activity before being tired the rest of the day.  He had been therapeutic with his Coumadin for several weeks and has not missed any doses.  He asked about triggers for atrial fibrillation and this was discussed.  He stated he would like to proceed with the procedure one more time.  I  scheduled him for DCCV and have him follow-up after the procedure.  Underwent successful DCCV with 120 J x 1 on 04/25/19  He presents to the clinic today for follow-up and states he feels well.  He definitely feels better after DCCV than he did before DCCV.  He has been able to plan his garden.  He states that he still has some fatigue however, he attributes this to age, increased weight, and lack of physical activity.  He states that when he is off prednisone he notices more trouble with his right joints due to his Wegener's disease.  I will have him follow-up with Dr. Oval Linsey in 3 months or sooner if needed.  Today he denies chest pain, shortness of breath, lower extremity edema, palpitations, melena, hematuria, hemoptysis, diaphoresis, weakness, presyncope, syncope, orthopnea, and PND.   Home Medications    Prior to Admission medications   Medication Sig Start Date End Date Taking? Authorizing Provider  Alirocumab (PRALUENT) 150 MG/ML SOAJ Inject 150 mg into the skin every 14 (fourteen) days. 12/20/18   [name], MD  ALPRAZolam Duanne Moron) 1 MG tablet Take 1 mg by mouth at bedtime.  05/09/17   [provider]  amLODipine (NORVASC) 2.5 MG tablet Take 1  tablet (2.5 mg total) by mouth daily. 02/26/19   [name], MD  calcium carbonate (CALCIUM 600) 600 MG TABS tablet Take 600 mg by mouth daily with breakfast.    [provider]  carvedilol (COREG) 25 MG tablet Take 1 tablet (25 mg total) by mouth 2 (two) times daily. 02/26/19   [name], MD  cetirizine (ZYRTEC) 10 MG tablet Take 10 mg by mouth daily.    [provider]  Cholecalciferol (VITAMIN D3) 125 MCG (5000 UT) TABS Take 5,000 Units by mouth daily.    [provider]  Coenzyme Q10 (CO Q 10) 100 MG CAPS Take 300 mg by mouth daily.     [provider]  hydrochlorothiazide (HYDRODIURIL) 12.5 MG tablet TAKE 1 TABLET BY MOUTH EVERY DAY Patient taking differently: Take 12.5 mg by mouth at bedtime.  04/16/19   [name], MD  levothyroxine Wil[place] Floor,  LEVOTHROID) 137 MCG tablet Take 137-205.5 mcg by mouth See admin instructions. Take 205.5 mcg by mouth on Wednesday.  Take 137 mcg by mouth daily on all other days 10/27/14   [provider]  losartan (COZAAR) 100 MG tablet Take 1 tablet (100 mg total) by mouth daily. 07/14/18 04/19/19  [name], MD  metFORMIN (GLUCOPHAGE) 500 MG tablet Take 500 mg by mouth 2 (two) times daily. 10/27/17   [provider]  Multiple Vitamin (MULTIVITAMIN WITH MINERALS) TABS tablet Take 1 tablet by mouth daily. Centrum silver    [provider]  prednisoLONE acetate (PRED FORTE) 1 % ophthalmic suspension Place 1 drop into the right eye as needed (if feel like sand in eye).    [provider]  riTUXimab (RITUXAN IV) Inject 1 application into the vein every 6 (six) months.     [provider]  sodium chloride (OCEAN) 0.65 % SOLN nasal spray Place 1 spray into both nostrils daily as needed for congestion.     [provider]  sulfamethoxazole-trimethoprim (BACTRIM,SEPTRA) 400-80 MG tablet Take 1 tablet by mouth daily.    [provider]  testosterone  cypionate (DEPOTESTOSTERONE CYPIONATE) 200 MG/ML injection Inject 200 mg into the muscle every 14 (fourteen) days. 10/09/14   [provider]  venlafaxine XR (EFFEXOR-XR) 150 MG 24 hr capsule Take 150 mg by mouth daily.     [provider]  warfarin (COUMADIN) 5 MG tablet TAKE 1 TABLET BY MOUTH EVERY DAY 05/18/19   [name], MD    Family History    Family History  Problem Relation Age of Onset  . Heart attack Mother   . Other Father        odd stomach disorder  . Stroke Maternal Grandmother   . Heart attack Maternal Grandfather   . Heart attack Paternal Grandmother   . Heart attack Paternal Grandfather    He indicated that his mother is deceased. He indicated that his father is deceased. He indicated that his brother is deceased. He indicated that his maternal grandmother is deceased. He indicated that his maternal grandfather is deceased. He indicated that his paternal grandmother is deceased. He indicated that his paternal grandfather is deceased.  Social History    Social History   Socioeconomic History  . Marital status: Married    Spouse name: Not on file  . Number of children: Not on file  . Years of education: Not on file  . Highest education level: Not on file  Occupational History  . Not on file  Tobacco Use  . Smoking status: Former Smoker    Types: Cigarettes    Quit date: 12/24/1983    Years since quitting: 35.4  . Smokeless tobacco: Never Used  Substance and Sexual Activity  . Alcohol use: No    Alcohol/week: 0.0 standard drinks  . Drug use: No  . Sexual activity: Not on file  Other Topics Concern  . Not on file  Social History Narrative  . Not on file   Social Determinants of Health   Financial Resource Strain:   . Difficulty of Paying Living Expenses:   Food Insecurity:   . Worried About Charity fundraiser in the Last Year:   . Arboriculturist in the Last Year:   Transportation Needs:   . Film/video editor (Medical):    Marland Kitchen Lack of Transportation (Non-Medical):   Physical Activity:   . Days of Exercise per Week:   . Minutes of  Exercise per Session:   Stress:   . Feeling of Stress :   Social Connections:   . Frequency of Communication with Friends and Family:   . Frequency of Social Gatherings with Friends and Family:   . Attends Religious Services:   . Active Member of Clubs or Organizations:   . Attends Archivist Meetings:   Marland Kitchen Marital Status:   Intimate Partner Violence:   . Fear of Current or Ex-Partner:   . Emotionally Abused:   Marland Kitchen Physically Abused:   . Sexually Abused:      Review of Systems    General:  No chills, fever, night sweats or weight changes.  Cardiovascular:  No chest pain, dyspnea on exertion, edema, orthopnea, palpitations, paroxysmal nocturnal dyspnea. Dermatological: No rash, lesions/masses Respiratory: No cough, dyspnea Urologic: No hematuria, dysuria Abdominal:   No nausea, vomiting, diarrhea, bright red blood per rectum, melena, or hematemesis Neurologic:  No visual changes, wkns, changes in mental status. All other systems reviewed and are otherwise negative except as noted above.  Physical Exam    VS:  BP (!) 104/58 (BP Location: Left Arm, Patient Position: Sitting, Cuff Size: Normal)   Pulse (!) 53   Ht 5\' 10"  (1.778 m)   Wt 237 lb 3.2 oz (107.6 kg)   SpO2 96% Comment: at rest  BMI 34.03 kg/m  , BMI Body mass index is 34.03 kg/m. GEN: Well nourished, well developed, in no acute distress. HEENT: normal. Neck: Supple, no JVD, carotid bruits, or masses. Cardiac: RRR, no murmurs, rubs, or gallops. No clubbing, cyanosis, edema.  Radials/DP/PT 2+ and equal bilaterally.  Respiratory:  Respirations regular and unlabored, clear to auscultation bilaterally. GI: Soft, nontender, nondistended, BS + x 4. MS: no deformity or atrophy. Skin: warm and dry, no rash. Neuro:  Strength and sensation are intact. Psych: Normal affect.  Accessory Clinical Findings     ECG personally reviewed by me today-sinus bradycardia septal infarct undetermined age 58 bpm- No acute changes   EKG 04/19/19 atrial fibrillation septal infarct undetermined age 47 bpm  EKG 11/23/2018 Atrial fibrillation septal infarct undetermined age 30 bpm  Echocardiogram 11/20/2014 Study Conclusions  - Left ventricle: The cavity size was normal. Wall thickness was increased in a pattern of moderate LVH. Systolic function was mildly reduced. The estimated ejection fraction was in the range of 45% to 50%. Inferiorlateral hypokinesis. Abnormal GLPSS at -11%, with inferolateral strain abnormality. The study is not technically sufficient to allow evaluation of LV diastolic function. - Aorta: Ascending aortic diameter: 40 mm (S). - Ascending aorta: The ascending aorta was mildly dilated. - Left atrium: The atrium was normal in size. - Right atrium: Severely dilated at 30 cm2. - Systemic veins: The IVC measures <2.1 cm, but does not collapse >50%, suggesting an elevated RA pressure of 8 mmHg.  Nuclear stress test 11/27/14:  There was no ST segment deviation noted during stress.  No T wave inversion was noted during stress.  Defect 1: There is a small defect of moderate severity.  Small size, moderate-intensity fixed inferolateral wall defect - could represent artifact or scar. The study was non-gated, therefore LVEF was not calculated and wall motion abnormalities could not be determined. Based on the lack of significant reversible ischemia, this is a low risk study.  Carotid Doppler ultrasound  02/19/15: 1-39% ICA stenosis bilaterally  Assessment & Plan   1.  Atrial fibrillation-EKG today shows sinus bradycardia septal infarct undetermined age 64 bpm. Has been therapeutic on Coumadin since 02/12/2019.  CHA2DS2-VASc score 3.2% stroke rate/year from a score of 3 (CHF, hypertension, and monitor). Underwent successful DCCV with 120 J x 1 on 04/25/19 Continue  Coumadin Avoid trigger Caffeine, chocolate, and EtoH.    Coronary artery disease-no chest pain today.  Status post CABG 2006 and PCI with multiple stents with last cardiac catheter around 2006.  Nuclear stress test 11-16 showed fixed the fact, no ischemia. Heart healthy low-sodium diet Increase physical activity as tolerated Continue losartan Continue carvedilol On warfarin-no ASA  Carotid stenosis-carotid ultrasound 02/19/2015 showed bilateral 1-39% ICA stenosis Continue Praluent  Essential hypertension-BP today 104/58.  Well-controlled at home Continue losartan Continue carvedilol Heart healthy low-sodium diet-salty 6 given Increase physical activity as tolerated  Aortic aneurysm-no recent abdominal or back pain.  Previously evaluated 11/15 and 3/18 stable at 4.0 cm. Recommend repeat echocardiogram after DCCV. Continue to monitor.  Disposition: Follow-up with Dr. Oval Linsey in 3 months   Jossie Ng. Benji Poynter NP-C    06/11/2019, 12:06 PM [address] Office [phone] Fax [phone]

## 2019-06-11 ENCOUNTER — Telehealth: Payer: Self-pay | Admitting: General Practice

## 2019-06-11 ENCOUNTER — Other Ambulatory Visit: Payer: Self-pay

## 2019-06-11 ENCOUNTER — Ambulatory Visit (INDEPENDENT_AMBULATORY_CARE_PROVIDER_SITE_OTHER): Payer: Medicare Other | Admitting: General Practice

## 2019-06-11 ENCOUNTER — Encounter: Payer: Self-pay | Admitting: General Practice

## 2019-06-11 VITALS — BP 104/58 | HR 53 | Ht 70.0 in | Wt 237.2 lb

## 2019-06-11 DIAGNOSIS — I4819 Other persistent atrial fibrillation: Secondary | ICD-10-CM | POA: Diagnosis not present

## 2019-06-11 DIAGNOSIS — I6523 Occlusion and stenosis of bilateral carotid arteries: Secondary | ICD-10-CM

## 2019-06-11 DIAGNOSIS — I10 Essential (primary) hypertension: Secondary | ICD-10-CM | POA: Diagnosis not present

## 2019-06-11 DIAGNOSIS — I7121 Aneurysm of the ascending aorta, without rupture: Secondary | ICD-10-CM

## 2019-06-11 DIAGNOSIS — I712 Thoracic aortic aneurysm, without rupture: Secondary | ICD-10-CM

## 2019-06-11 DIAGNOSIS — I2510 Atherosclerotic heart disease of native coronary artery without angina pectoris: Secondary | ICD-10-CM | POA: Diagnosis not present

## 2019-06-11 NOTE — Patient Instructions (Addendum)
Medication Instructions:  Your physician recommends that you continue on your current medications as directed. Please refer to the Current Medication list given to you today.  *If you need a refill on your cardiac medications before your next appointment, please call your pharmacy*   Lab Work: None ordered  If you have labs (blood work) drawn today and your tests are completely normal, you will receive your results only by: Marland Kitchen MyChart Message (if you have MyChart) OR . A paper copy in the mail If you have any lab test that is abnormal or we need to change your treatment, we will call you to review the results.   Testing/Procedures: None ordered   Follow-Up: At [facility], you and your health needs are our priority.  As part of our continuing mission to provide you with exceptional heart care, we have created designated Provider Care Teams.  These Care Teams include your primary Cardiologist (physician) and Advanced Practice Providers (APPs -  Physician Assistants and Nurse Practitioners) who all work together to provide you with the care you need, when you need it.  We recommend signing up for the patient portal called "MyChart".  Sign up information is provided on this After Visit Summary.  MyChart is used to connect with patients for Virtual Visits (Telemedicine).  Patients are able to view lab/test results, encounter notes, upcoming appointments, etc.  Non-urgent messages can be sent to your provider as well.   To learn more about what you can do with MyChart, go to [email or web address].    Your next appointment:   3 month(s)    09/11/19 ARRIVE AT 10:45 TO SEE DR. [place]  The format for your next appointment:   In Person  Provider:   You may see [name], MD or one of the following Advanced Practice Providers on your designated Care Team:    Kerin Ransom, PA-C  [place], [place]  Coletta Memos, [place]    Other Instructions

## 2019-06-11 NOTE — Telephone Encounter (Signed)
I called and verified patient insurance through the company on 06/11/19 and the reference number [id number]

## 2019-07-13 ENCOUNTER — Other Ambulatory Visit: Payer: Self-pay

## 2019-07-13 ENCOUNTER — Ambulatory Visit (INDEPENDENT_AMBULATORY_CARE_PROVIDER_SITE_OTHER): Payer: Medicare Other | Admitting: Pharmacist

## 2019-07-13 DIAGNOSIS — Z7901 Long term (current) use of anticoagulants: Secondary | ICD-10-CM

## 2019-07-13 DIAGNOSIS — I4819 Other persistent atrial fibrillation: Secondary | ICD-10-CM

## 2019-07-13 DIAGNOSIS — I480 Paroxysmal atrial fibrillation: Secondary | ICD-10-CM

## 2019-07-13 DIAGNOSIS — I4891 Unspecified atrial fibrillation: Secondary | ICD-10-CM

## 2019-07-13 LAB — POCT INR: INR: 2.5 (ref 2.0–3.0)

## 2019-07-13 NOTE — Patient Instructions (Signed)
Description   Continue taking warfarin 5mg  daily and repeat INR in 6 weeks.

## 2019-07-18 DIAGNOSIS — C44619 Basal cell carcinoma of skin of left upper limb, including shoulder: Secondary | ICD-10-CM | POA: Diagnosis not present

## 2019-07-18 DIAGNOSIS — L570 Actinic keratosis: Secondary | ICD-10-CM | POA: Diagnosis not present

## 2019-07-18 DIAGNOSIS — D1801 Hemangioma of skin and subcutaneous tissue: Secondary | ICD-10-CM | POA: Diagnosis not present

## 2019-07-18 DIAGNOSIS — D225 Melanocytic nevi of trunk: Secondary | ICD-10-CM | POA: Diagnosis not present

## 2019-07-18 DIAGNOSIS — L738 Other specified follicular disorders: Secondary | ICD-10-CM | POA: Diagnosis not present

## 2019-07-18 DIAGNOSIS — Z85828 Personal history of other malignant neoplasm of skin: Secondary | ICD-10-CM | POA: Diagnosis not present

## 2019-07-18 DIAGNOSIS — L812 Freckles: Secondary | ICD-10-CM | POA: Diagnosis not present

## 2019-07-18 DIAGNOSIS — D485 Neoplasm of uncertain behavior of skin: Secondary | ICD-10-CM | POA: Diagnosis not present

## 2019-07-18 DIAGNOSIS — C4359 Malignant melanoma of other part of trunk: Secondary | ICD-10-CM | POA: Diagnosis not present

## 2019-07-18 DIAGNOSIS — L821 Other seborrheic keratosis: Secondary | ICD-10-CM | POA: Diagnosis not present

## 2019-07-18 DIAGNOSIS — L0889 Other specified local infections of the skin and subcutaneous tissue: Secondary | ICD-10-CM | POA: Diagnosis not present

## 2019-07-23 DIAGNOSIS — B0052 Herpesviral keratitis: Secondary | ICD-10-CM | POA: Diagnosis not present

## 2019-07-23 DIAGNOSIS — H40033 Anatomical narrow angle, bilateral: Secondary | ICD-10-CM | POA: Diagnosis not present

## 2019-07-24 DIAGNOSIS — Z125 Encounter for screening for malignant neoplasm of prostate: Secondary | ICD-10-CM | POA: Diagnosis not present

## 2019-07-24 DIAGNOSIS — E038 Other specified hypothyroidism: Secondary | ICD-10-CM | POA: Diagnosis not present

## 2019-07-24 DIAGNOSIS — E7849 Other hyperlipidemia: Secondary | ICD-10-CM | POA: Diagnosis not present

## 2019-07-24 DIAGNOSIS — R7301 Impaired fasting glucose: Secondary | ICD-10-CM | POA: Diagnosis not present

## 2019-07-24 DIAGNOSIS — I10 Essential (primary) hypertension: Secondary | ICD-10-CM | POA: Diagnosis not present

## 2019-07-27 DIAGNOSIS — I5022 Chronic systolic (congestive) heart failure: Secondary | ICD-10-CM | POA: Diagnosis not present

## 2019-07-27 DIAGNOSIS — G4733 Obstructive sleep apnea (adult) (pediatric): Secondary | ICD-10-CM | POA: Diagnosis not present

## 2019-07-27 DIAGNOSIS — E291 Testicular hypofunction: Secondary | ICD-10-CM | POA: Diagnosis not present

## 2019-07-27 DIAGNOSIS — I4891 Unspecified atrial fibrillation: Secondary | ICD-10-CM | POA: Diagnosis not present

## 2019-07-27 DIAGNOSIS — E039 Hypothyroidism, unspecified: Secondary | ICD-10-CM | POA: Diagnosis not present

## 2019-07-27 DIAGNOSIS — I719 Aortic aneurysm of unspecified site, without rupture: Secondary | ICD-10-CM | POA: Diagnosis not present

## 2019-07-27 DIAGNOSIS — Z0000 Encounter for general adult medical examination without abnormal findings: Secondary | ICD-10-CM | POA: Diagnosis not present

## 2019-07-27 DIAGNOSIS — I10 Essential (primary) hypertension: Secondary | ICD-10-CM | POA: Diagnosis not present

## 2019-07-27 DIAGNOSIS — Z1212 Encounter for screening for malignant neoplasm of rectum: Secondary | ICD-10-CM | POA: Diagnosis not present

## 2019-07-27 DIAGNOSIS — I25729 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: Secondary | ICD-10-CM | POA: Diagnosis not present

## 2019-07-27 DIAGNOSIS — Z23 Encounter for immunization: Secondary | ICD-10-CM | POA: Diagnosis not present

## 2019-07-27 DIAGNOSIS — R82998 Other abnormal findings in urine: Secondary | ICD-10-CM | POA: Diagnosis not present

## 2019-07-27 DIAGNOSIS — R7301 Impaired fasting glucose: Secondary | ICD-10-CM | POA: Diagnosis not present

## 2019-07-27 DIAGNOSIS — E7849 Other hyperlipidemia: Secondary | ICD-10-CM | POA: Diagnosis not present

## 2019-07-31 DIAGNOSIS — H40033 Anatomical narrow angle, bilateral: Secondary | ICD-10-CM | POA: Diagnosis not present

## 2019-07-31 DIAGNOSIS — H40032 Anatomical narrow angle, left eye: Secondary | ICD-10-CM | POA: Diagnosis not present

## 2019-08-02 DIAGNOSIS — K921 Melena: Secondary | ICD-10-CM | POA: Diagnosis not present

## 2019-08-08 DIAGNOSIS — C4362 Malignant melanoma of left upper limb, including shoulder: Secondary | ICD-10-CM | POA: Diagnosis not present

## 2019-08-08 DIAGNOSIS — Z85820 Personal history of malignant melanoma of skin: Secondary | ICD-10-CM | POA: Diagnosis not present

## 2019-08-08 DIAGNOSIS — D0359 Melanoma in situ of other part of trunk: Secondary | ICD-10-CM | POA: Diagnosis not present

## 2019-08-08 DIAGNOSIS — Z85828 Personal history of other malignant neoplasm of skin: Secondary | ICD-10-CM | POA: Diagnosis not present

## 2019-08-09 DIAGNOSIS — E039 Hypothyroidism, unspecified: Secondary | ICD-10-CM | POA: Diagnosis not present

## 2019-08-14 DIAGNOSIS — Z86010 Personal history of colonic polyps: Secondary | ICD-10-CM | POA: Diagnosis not present

## 2019-08-14 DIAGNOSIS — R195 Other fecal abnormalities: Secondary | ICD-10-CM | POA: Diagnosis not present

## 2019-08-15 ENCOUNTER — Telehealth: Payer: Self-pay | Admitting: Cardiovascular Disease

## 2019-08-15 NOTE — Telephone Encounter (Signed)
Patient with diagnosis of afib on warfarin for anticoagulation.    Procedure: colonoscopy Date of procedure: 09/04/19  CHADS2-VASc score of 5 (age x2, CHF, HTN, CAD), also prediabetic. Underwent cardioversion 04/25/19.  CrCl 49mL/min using adjusted body weight Platelet count 374K  Per office protocol, patient can hold warfarin for 5 days prior to procedure.

## 2019-08-15 NOTE — Telephone Encounter (Signed)
   [facility] Pre-operative Risk Assessment    Request for surgical clearance:  1. What type of surgery is being performed? Colonoscopy - heme positive stools/history of colon polyps   2. When is this surgery scheduled? 09/04/2019   3. What type of clearance is required (medical clearance vs. Pharmacy clearance to hold med vs. Both)? Both   4. Are there any medications that need to be held prior to surgery and how long? Warfarin    5. Practice name and name of physician performing surgery? Dr. Penelope Coop with [facility]    6. What is the office phone number? [phone]   7.   What is the office fax number?  [phone]  8.   Anesthesia type (None, local, MAC, general) ? Not specified    Fidel Levy 08/15/2019, 8:03 AM  _________________________________________________________________   (provider comments below)

## 2019-08-15 NOTE — Telephone Encounter (Signed)
   Primary Cardiologist: [name], MD  Chart reviewed and the patient was contacted by phone today as part of pre-operative protocol coverage. Given past medical history and time since last visit, based on ACC/AHA guidelines, DAVIEON STOCKHAM would be at acceptable risk for the planned procedure without further cardiovascular testing.   Okay to hold warfarin 5 days preop.  Resume as soon as safe postop.  I will route this recommendation to the requesting party via Epic fax function and remove from pre-op pool.  Please call with questions.  Kerin Ransom, PA-C 08/15/2019, 3:58 PM

## 2019-08-15 NOTE — Telephone Encounter (Signed)
Pharmacy please comment on anticoagulation and then will contact the patient for cardiac clearance.  Kerin Ransom PA-C 08/15/2019 3:02 PM

## 2019-08-16 DIAGNOSIS — I2510 Atherosclerotic heart disease of native coronary artery without angina pectoris: Secondary | ICD-10-CM | POA: Diagnosis not present

## 2019-08-16 DIAGNOSIS — E119 Type 2 diabetes mellitus without complications: Secondary | ICD-10-CM | POA: Diagnosis not present

## 2019-08-16 DIAGNOSIS — M3130 Wegener's granulomatosis without renal involvement: Secondary | ICD-10-CM | POA: Diagnosis not present

## 2019-08-16 DIAGNOSIS — E291 Testicular hypofunction: Secondary | ICD-10-CM | POA: Diagnosis not present

## 2019-08-16 DIAGNOSIS — I480 Paroxysmal atrial fibrillation: Secondary | ICD-10-CM | POA: Diagnosis not present

## 2019-08-16 DIAGNOSIS — Z6831 Body mass index (BMI) 31.0-31.9, adult: Secondary | ICD-10-CM | POA: Diagnosis not present

## 2019-08-16 DIAGNOSIS — E039 Hypothyroidism, unspecified: Secondary | ICD-10-CM | POA: Diagnosis not present

## 2019-08-24 ENCOUNTER — Ambulatory Visit (INDEPENDENT_AMBULATORY_CARE_PROVIDER_SITE_OTHER): Payer: Medicare Other

## 2019-08-24 ENCOUNTER — Other Ambulatory Visit: Payer: Self-pay

## 2019-08-24 DIAGNOSIS — Z7901 Long term (current) use of anticoagulants: Secondary | ICD-10-CM | POA: Diagnosis not present

## 2019-08-24 DIAGNOSIS — I480 Paroxysmal atrial fibrillation: Secondary | ICD-10-CM

## 2019-08-24 LAB — POCT INR: INR: 2.4 (ref 2.0–3.0)

## 2019-08-24 NOTE — Patient Instructions (Signed)
Continue taking warfarin 5mg  daily and repeat INR in 4 weeks.

## 2019-08-30 DIAGNOSIS — Z1159 Encounter for screening for other viral diseases: Secondary | ICD-10-CM | POA: Diagnosis not present

## 2019-09-04 DIAGNOSIS — R195 Other fecal abnormalities: Secondary | ICD-10-CM | POA: Diagnosis not present

## 2019-09-04 DIAGNOSIS — K5730 Diverticulosis of large intestine without perforation or abscess without bleeding: Secondary | ICD-10-CM | POA: Diagnosis not present

## 2019-09-04 DIAGNOSIS — K5520 Angiodysplasia of colon without hemorrhage: Secondary | ICD-10-CM | POA: Diagnosis not present

## 2019-09-06 DIAGNOSIS — Z6832 Body mass index (BMI) 32.0-32.9, adult: Secondary | ICD-10-CM | POA: Diagnosis not present

## 2019-09-06 DIAGNOSIS — Z7952 Long term (current) use of systemic steroids: Secondary | ICD-10-CM | POA: Diagnosis not present

## 2019-09-06 DIAGNOSIS — M6281 Muscle weakness (generalized): Secondary | ICD-10-CM | POA: Diagnosis not present

## 2019-09-06 DIAGNOSIS — Z23 Encounter for immunization: Secondary | ICD-10-CM | POA: Diagnosis not present

## 2019-09-06 DIAGNOSIS — E669 Obesity, unspecified: Secondary | ICD-10-CM | POA: Diagnosis not present

## 2019-09-06 DIAGNOSIS — M3130 Wegener's granulomatosis without renal involvement: Secondary | ICD-10-CM | POA: Diagnosis not present

## 2019-09-11 ENCOUNTER — Encounter: Payer: Self-pay | Admitting: Cardiovascular Disease

## 2019-09-11 ENCOUNTER — Other Ambulatory Visit: Payer: Self-pay

## 2019-09-11 ENCOUNTER — Ambulatory Visit (INDEPENDENT_AMBULATORY_CARE_PROVIDER_SITE_OTHER): Payer: Medicare Other | Admitting: Cardiovascular Disease

## 2019-09-11 ENCOUNTER — Ambulatory Visit (INDEPENDENT_AMBULATORY_CARE_PROVIDER_SITE_OTHER): Payer: Medicare Other | Admitting: Pharmacist Clinician (PhC)/ Clinical Pharmacy Specialist

## 2019-09-11 VITALS — BP 110/62 | HR 56 | Ht 70.0 in | Wt 225.0 lb

## 2019-09-11 DIAGNOSIS — I2510 Atherosclerotic heart disease of native coronary artery without angina pectoris: Secondary | ICD-10-CM | POA: Diagnosis not present

## 2019-09-11 DIAGNOSIS — Z7901 Long term (current) use of anticoagulants: Secondary | ICD-10-CM

## 2019-09-11 DIAGNOSIS — I2583 Coronary atherosclerosis due to lipid rich plaque: Secondary | ICD-10-CM | POA: Diagnosis not present

## 2019-09-11 DIAGNOSIS — I4819 Other persistent atrial fibrillation: Secondary | ICD-10-CM

## 2019-09-11 DIAGNOSIS — I712 Thoracic aortic aneurysm, without rupture, unspecified: Secondary | ICD-10-CM

## 2019-09-11 DIAGNOSIS — I5042 Chronic combined systolic (congestive) and diastolic (congestive) heart failure: Secondary | ICD-10-CM

## 2019-09-11 DIAGNOSIS — E7800 Pure hypercholesterolemia, unspecified: Secondary | ICD-10-CM | POA: Diagnosis not present

## 2019-09-11 DIAGNOSIS — M3130 Wegener's granulomatosis without renal involvement: Secondary | ICD-10-CM | POA: Diagnosis not present

## 2019-09-11 DIAGNOSIS — I480 Paroxysmal atrial fibrillation: Secondary | ICD-10-CM | POA: Diagnosis not present

## 2019-09-11 DIAGNOSIS — I739 Peripheral vascular disease, unspecified: Secondary | ICD-10-CM | POA: Diagnosis not present

## 2019-09-11 DIAGNOSIS — I6523 Occlusion and stenosis of bilateral carotid arteries: Secondary | ICD-10-CM

## 2019-09-11 DIAGNOSIS — I10 Essential (primary) hypertension: Secondary | ICD-10-CM | POA: Diagnosis not present

## 2019-09-11 LAB — POCT INR: INR: 1.3 — AB (ref 2.0–3.0)

## 2019-09-11 NOTE — Patient Instructions (Signed)
Medication Instructions:  Your physician recommends that you continue on your current medications as directed. Please refer to the Current Medication list given to you today.  *If you need a refill on your cardiac medications before your next appointment, please call your pharmacy*  Lab Work: NONE  Testing/Procedures: Your physician has requested that you have an ankle brachial index (ABI). During this test an ultrasound and blood pressure cuff are used to evaluate the arteries that supply the arms and legs with blood. Allow thirty minutes for this exam. There are no restrictions or special instructions.  Follow-Up: At [facility], you and your health needs are our priority.  As part of our continuing mission to provide you with exceptional heart care, we have created designated Provider Care Teams.  These Care Teams include your primary Cardiologist (physician) and Advanced Practice Providers (APPs -  Physician Assistants and Nurse Practitioners) who all work together to provide you with the care you need, when you need it.  We recommend signing up for the patient portal called "MyChart".  Sign up information is provided on this After Visit Summary.  MyChart is used to connect with patients for Virtual Visits (Telemedicine).  Patients are able to view lab/test results, encounter notes, upcoming appointments, etc.  Non-urgent messages can be sent to your provider as well.   To learn more about what you can do with MyChart, go to [email or web address].    Your next appointment:   12 month(s)  You will receive a reminder letter in the mail two months in advance. If you don't receive a letter, please call our office to schedule the follow-up appointment.  The format for your next appointment:   In Person  Provider:   You may see [name], MD or one of the following Advanced Practice Providers on your designated Care Team:    Kerin Ransom, PA-C  [place], [place]  Coletta Memos, [place]

## 2019-09-11 NOTE — Progress Notes (Signed)
Cardiology Office Note   Date:  09/11/2019   ID:  DERRICH GABY, DOB 10-Aug-1945, MRN [id number]  PCP:  [name], DO  Cardiologist:   [name], MD   No chief complaint on file.   Patient ID: Gary [name] is a 74 y.o. male with hypertension, CAD s/p PCI and CABG, mild ascending aortic aneurysm, chronic systolic and diastolic heart failure, hyperlipidemia, paroxysmal atrial fibrillation, and OSA non-compliant with CPAP who presents for follow up.  Gary [name] was first seen 11/2014 after being diagnosed with atrial fibrillation.  He felt tired and underwent successful DCCV on 12/2014.  He had an echo 11/2014 that revealed LVEF 45-50% with inferolateral hypokinesis.  He also was noted to have an ascending aorta aneurysm (4.0cm).  Repeat echo 03/2016 was unchanged. He then had a Lexiscan Myoview 11/27/14 that revealed a small, fixed inferolateral defect.  He was noted to have a carotid bruit so he was referred for carotid Doppler 02/2015 that revealed 1-39% ICA stenosis bilaterally.   He request to switch to warfarin due to cost.  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  It was removed with clean margins.  He feels like his legs are tired.  His legs feel like they are heavy when walking.  Its like he is wearing concrete shoes.  He has no pain.  He has no discomfort at rest.  He denies any back pain.  His exercise consists of walking.  However he is not able to walk long distances due to the heavy feeling in his legs.  He has no chest pain and his breathing is stable.  He denies any lower extremity edema, orthopnea, or PND.  His blood pressure remains well have been controlled at home.  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    Past Surgical History:  Procedure Laterality Date   CARDIOVERSION N/A 12/06/2014   Procedure: CARDIOVERSION;  Surgeon: [name], MD;  Location: [place];  Service: Cardiovascular;  Laterality: N/A;   CARDIOVERSION N/A 05/26/2017   Procedure: CARDIOVERSION;  Surgeon: [name], MD;  Location: [place];  Service: Cardiovascular;  Laterality: N/A;   CARDIOVERSION N/A 04/25/2019   Procedure: CARDIOVERSION;  Surgeon: Sanda Klein, MD;  [facility];  Service: Cardiovascular;  Laterality: N/A;   [place]     multiple stents   CORONARY ARTERY BYPASS GRAFT  2006   EYE SURGERY     right eye-  muscular repair   TOTAL HIP ARTHROPLASTY  01/03/2012   Procedure: TOTAL HIP ARTHROPLASTY;  Surgeon: [name], MD;  Location: [facility];  Service: Orthopedics;  Laterality: Right;     Current Outpatient Medications  Medication Sig Dispense Refill   Alirocumab (PRALUENT) 150 MG/ML SOAJ Inject 150 mg into the skin every 14 (fourteen) days. 2 pen 11   ALPRAZolam (  XANAX) 1 MG tablet Take 1 mg by mouth at bedtime.   1   amLODipine (NORVASC) 2.5 MG tablet Take 1 tablet (2.5 mg total) by mouth daily. 90 tablet 3   calcium carbonate (CALCIUM 600) 600 MG TABS tablet Take 600 mg by mouth daily with breakfast.     carvedilol (COREG) 25 MG tablet Take 1 tablet (25 mg total) by mouth 2 (two) times daily. 180 tablet 3   cetirizine (ZYRTEC) 10 MG tablet Take 10 mg by mouth daily.     Cholecalciferol (VITAMIN D3) 125 MCG (5000 UT) TABS Take 5,000 Units by mouth daily.     Coenzyme Q10 (CO Q 10) 100 MG CAPS Take 300 mg by  mouth daily.      hydrochlorothiazide (HYDRODIURIL) 12.5 MG tablet TAKE 1 TABLET BY MOUTH EVERY DAY (Patient taking differently: Take 12.5 mg by mouth at bedtime. ) 90 tablet 1   levothyroxine (SYNTHROID, LEVOTHROID) 137 MCG tablet Take 137-205.5 mcg by mouth See admin instructions. Take 205.5 mcg by mouth on Wednesday.  Take 137 mcg by mouth daily on all other days     metFORMIN (GLUCOPHAGE) 500 MG tablet Take 500 mg by mouth 2 (two) times daily.  12   Multiple Vitamin (MULTIVITAMIN WITH MINERALS) TABS tablet Take 1 tablet by mouth daily. Centrum silver     prednisoLONE acetate (PRED FORTE) 1 % ophthalmic suspension Place 1 drop into the right eye as needed (if feel like sand in eye).     predniSONE (DELTASONE) 5 MG tablet Take 5 mg by mouth as needed (for wagoners).     riTUXimab (RITUXAN IV) Inject 1 application into the vein every 6 (six) months.      sodium chloride (OCEAN) 0.65 % SOLN nasal spray Place 1 spray into both nostrils daily as needed for congestion.      testosterone cypionate (DEPOTESTOSTERONE CYPIONATE) 200 MG/ML injection Inject 50 mg into the muscle every 7 (seven) days.      venlafaxine XR (EFFEXOR-XR) 150 MG 24 hr capsule Take 150 mg by mouth daily.      warfarin (COUMADIN) 5 MG tablet TAKE 1 TABLET BY MOUTH EVERY DAY 30 tablet 5   losartan (COZAAR) 100 MG tablet Take 1 tablet (100 mg total) by mouth daily. 90 tablet 3   No current facility-administered medications for this visit.    Allergies:   Statins    Social History:  The patient  reports that he quit smoking about 35 years ago. His smoking use included cigarettes. He has never used smokeless tobacco. He reports that he does not drink alcohol and does not use drugs.   Family History:  The patient's family history includes Heart attack in his maternal grandfather, mother, paternal grandfather, and paternal grandmother; Other in his father; Stroke in his maternal grandmother.    ROS:  Please see the  history of present illness.   Otherwise, review of systems are positive for none.   All other systems are reviewed and negative.    PHYSICAL EXAM: VS:  BP 110/62    Pulse (!) 56    Ht 5\' 10"  (1.778 m)    Wt 225 lb (102.1 kg)    SpO2 96%    BMI 32.28 kg/m  , BMI Body mass index is 32.28 kg/m. GENERAL:  Well appearing HEENT: Pupils equal round and reactive, fundi not visualized, oral mucosa unremarkable NECK:  No jugular venous distention, waveform within normal limits, carotid upstroke brisk and symmetric, no bruits LUNGS:  Clear to auscultation bilaterally HEART: Regular rate and rhythm.  PMI not displaced or sustained,S1 and S2 within normal limits, no S3, no S4, no clicks, no rubs, no murmurs ABD:  Flat, positive bowel sounds normal in frequency in pitch, no bruits, no rebound, no guarding, no midline pulsatile mass, no hepatomegaly, no splenomegaly EXT:  2 plus pulses throughout, no edema, no cyanosis no clubbing SKIN:  No rashes no nodules NEURO:  Cranial nerves II through XII grossly intact, motor grossly intact throughout PSYCH:  Cognitively intact, oriented to person place and time   EKG:  EKG is ordered today. 02/20/16: Sinus rhythm. Rate 66 bpm. Left ventricular hypertrophy with repolarization abnormality. 08/30/16:  Sinus rhythm. Rate 66 bpm. LAFB. Poor R wave progression. 04/12/17: Sinus rhythm.  Rate 65 bpm.  Prior anteroseptal infarct.  Nonspecific ST changes.   11/23/18: Atrial fibrillation.  Rate 77 bpm.  Prior septal infarct.   09/11/19: Sinus rhythm.  Rate 56 bpm.  Prior septal infarct  Echo 11/20/14: Study Conclusions  - Left ventricle: The cavity size was normal. Wall thickness was   increased in a pattern of moderate LVH. Systolic function was   mildly reduced. The estimated ejection fraction was in the range   of 45% to 50%. Inferiorlateral hypokinesis. Abnormal GLPSS at   -11%, with inferolateral strain abnormality. The study is not   technically sufficient to allow  evaluation of LV diastolic   function. - Aorta: Ascending aortic diameter: 40 mm (S). - Ascending aorta: The ascending aorta was mildly dilated. - Left atrium: The atrium was normal in size. - Right atrium: Severely dilated at 30 cm2. - Systemic veins: The IVC measures <2.1 cm, but does not collapse   >50%, suggesting an elevated RA pressure of 8 mmHg.  Lexiscan Myoview 11/27/14:  There was no ST segment deviation noted during stress.  No T wave inversion was noted during stress.  Defect 1: There is a small defect of moderate severity.  Small size, moderate-intensity fixed inferolateral wall defect - could represent artifact or scar. The study was non-gated, therefore LVEF was not calculated and wall motion abnormalities could not be determined. Based on the lack of significant reversible ischemia, this is a low risk study.  Carotid Doppler 02/19/15: 1-39% ICA stenosis bilaterally  Recent Labs: 04/19/2019: BUN 26; Creatinine, Ser 0.79; Hemoglobin 14.3; Platelets 374; Potassium 4.8; Sodium 141    Lipid Panel    Component Value Date/Time   CHOL 118 11/16/2018 0817   TRIG 99 11/16/2018 0817   HDL 40 11/16/2018 0817   CHOLHDL 3.0 11/16/2018 0817   CHOLHDL 5.4 (H) 02/19/2015 0821   VLDL 22 02/19/2015 0821   LDLCALC 59 11/16/2018 0817     07/21/16: Total cholesterol 105, triglycerides 94, HDL 36, LDL 49  Wt Readings from Last 3 Encounters:  09/11/19 225 lb (102.1 kg)  06/11/19 237 lb 3.2 oz (107.6 kg)  04/25/19 220 lb 14.4 oz (100.2 kg)      ASSESSMENT AND PLAN:  # Atrial fibrillation: Gary [name] is in sinus rhythm today.  He is unable to tell when he goes in and out of atrial fibrillation.  Continue with rate control.  He was switched from Eliquis to warfarin due to cost.  Continue carvedilol.  This patients CHA2DS2-VASc Score and unadjusted Ischemic Stroke Rate (% per year) is equal to 7.2 % stroke rate/year from a score of 5  Above score calculated as 1 point each if  present [CHF, HTN, DM, Vascular=MI/PAD/Aortic Plaque, Age if  65-74, or Male.  Above score calculated as 2 points each if present [Age > 75, or Stroke/TIA/TE]  # CAD s/p CABG and PCI: 11/2014 Gary [name] has not experienced any chest pain recently.  Cardiolite showed a fixed defect, either infarct or artifact but no ischemia.  Continue losartan and carvedilol.  No aspirin on warfarin.   # Carotid stenosis:  Mild carotid stenosis noted on carotid Dopplers 02/2015 and unchanged 04/2017.  # Hypertension:  BP well-controlled.   Continue amlodipine, carvedilol, HCTZ, and losartan.  # Hyperlipidemia: Lipids well-controlled on Praluent.   # Aortic aneurysm: Ascending aorta was 4.0 cm 11/2013 and 03/2016.  # Heavy leg: He has good peripheral pulses.  However his symptoms are concerning for claudication.  Check ABIs and Dopplers.   The patient does not have concerns regarding medicines.  The following changes have been made:  no change  Labs/ tests ordered today include:    Disposition:   FU with Gary Dredge C. Oval Linsey, MD in 1 year.    Signed, [name], MD  09/11/2019 1:19 PM    [facility]

## 2019-09-19 DIAGNOSIS — Z20822 Contact with and (suspected) exposure to covid-19: Secondary | ICD-10-CM | POA: Diagnosis not present

## 2019-09-24 ENCOUNTER — Other Ambulatory Visit: Payer: Self-pay

## 2019-09-24 ENCOUNTER — Ambulatory Visit (INDEPENDENT_AMBULATORY_CARE_PROVIDER_SITE_OTHER): Payer: Medicare Other

## 2019-09-24 ENCOUNTER — Other Ambulatory Visit: Payer: Self-pay | Admitting: Cardiovascular Disease

## 2019-09-24 DIAGNOSIS — I480 Paroxysmal atrial fibrillation: Secondary | ICD-10-CM

## 2019-09-24 DIAGNOSIS — Z7901 Long term (current) use of anticoagulants: Secondary | ICD-10-CM | POA: Diagnosis not present

## 2019-09-24 LAB — POCT INR: INR: 2.9 (ref 2.0–3.0)

## 2019-09-24 NOTE — Patient Instructions (Signed)
Continue taking warfarin 5mg  daily and repeat INR in 4 weeks.  CHADS-VASc = 4

## 2019-10-01 ENCOUNTER — Encounter (HOSPITAL_COMMUNITY): Payer: Medicare Other

## 2019-10-06 ENCOUNTER — Other Ambulatory Visit: Payer: Self-pay | Admitting: Cardiovascular Disease

## 2019-10-08 ENCOUNTER — Ambulatory Visit (HOSPITAL_COMMUNITY)
Admission: RE | Admit: 2019-10-08 | Discharge: 2019-10-08 | Disposition: A | Discharge status: Home / Self Care | Payer: Medicare Other | Source: Ambulatory Visit | Attending: Cardiology | Admitting: Cardiology

## 2019-10-08 ENCOUNTER — Other Ambulatory Visit: Payer: Self-pay

## 2019-10-08 DIAGNOSIS — I739 Peripheral vascular disease, unspecified: Secondary | ICD-10-CM

## 2019-10-09 ENCOUNTER — Other Ambulatory Visit: Payer: Self-pay | Admitting: Cardiovascular Disease

## 2019-10-10 DIAGNOSIS — M3130 Wegener's granulomatosis without renal involvement: Secondary | ICD-10-CM | POA: Diagnosis not present

## 2019-10-22 ENCOUNTER — Ambulatory Visit (INDEPENDENT_AMBULATORY_CARE_PROVIDER_SITE_OTHER): Payer: Medicare Other

## 2019-10-22 ENCOUNTER — Other Ambulatory Visit: Payer: Self-pay

## 2019-10-22 DIAGNOSIS — Z7901 Long term (current) use of anticoagulants: Secondary | ICD-10-CM | POA: Diagnosis not present

## 2019-10-22 DIAGNOSIS — I480 Paroxysmal atrial fibrillation: Secondary | ICD-10-CM

## 2019-10-22 LAB — POCT INR: INR: 2.6 (ref 2.0–3.0)

## 2019-10-22 NOTE — Patient Instructions (Signed)
Continue taking warfarin 5mg  daily and repeat INR in 6 weeks.  CHADS-VASc = 4

## 2019-10-23 ENCOUNTER — Ambulatory Visit (INDEPENDENT_AMBULATORY_CARE_PROVIDER_SITE_OTHER): Payer: Medicare Other | Admitting: Cardiovascular Disease

## 2019-10-23 ENCOUNTER — Encounter: Payer: Self-pay | Admitting: Cardiovascular Disease

## 2019-10-23 VITALS — BP 124/60 | HR 69 | Ht 70.0 in | Wt 225.0 lb

## 2019-10-23 DIAGNOSIS — I6523 Occlusion and stenosis of bilateral carotid arteries: Secondary | ICD-10-CM | POA: Diagnosis not present

## 2019-10-23 DIAGNOSIS — I480 Paroxysmal atrial fibrillation: Secondary | ICD-10-CM | POA: Diagnosis not present

## 2019-10-23 DIAGNOSIS — I10 Essential (primary) hypertension: Secondary | ICD-10-CM

## 2019-10-23 DIAGNOSIS — I739 Peripheral vascular disease, unspecified: Secondary | ICD-10-CM | POA: Diagnosis not present

## 2019-10-23 DIAGNOSIS — I2510 Atherosclerotic heart disease of native coronary artery without angina pectoris: Secondary | ICD-10-CM | POA: Diagnosis not present

## 2019-10-23 DIAGNOSIS — E785 Hyperlipidemia, unspecified: Secondary | ICD-10-CM

## 2019-10-23 NOTE — Progress Notes (Signed)
Cardiology Office Note   Date:  10/23/2019   ID:  Gary [name], DOB [birth year], MRN [id number]  PCP:  Gary Margarita, DO  Cardiologist: Dr. Oval [name]  Chief Complaint  Patient presents with  . Follow-up    ABN LEA.      History of Present Illness: Gary [name] is a 74 y.o. male who was referred by Dr. Oval [name] for evaluation and management of peripheral arterial disease.  He has known history of coronary artery disease status post PCI and CABG, small ascending aortic aneurysm, chronic systolic/diastolic heart failure, hyperlipidemia, Wegener's disease, paroxysmal atrial fibrillation and anticoagulation and obstructive sleep apnea.  He had recent melanoma removed from the left chest area. He reports bilateral leg fatigue and tiredness without pain.  This starts in the thigh area and goes all the way down.  No rest pain no lower extremity ulceration.  He underwent noninvasive vascular studies earlier this month which showed normal ABI and toe pressure bilaterally.  Duplex showed occluded mid anterior tibial artery on the right and the left.  He denies chest pain.  He reports stable exertional dyspnea.  Past Medical History:  Diagnosis Date  . Aortic aneurysm ([place]) 02/20/2016   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  . Arthritis   . Atrial fibrillation ([place]) 11/13/2014  . Cancer ([place])    skin  . Chronic combined systolic and diastolic heart failure (Gary [name]) 02/20/2016   LVEF 45-50% 11/2014  . Coronary artery disease   . Depression   . Essential hypertension 11/13/2014  . Hyperlipidemia   . Hyperlipidemia 11/13/2014  . Hypertension   . Sleep apnea    CPAP 'Uses half the time"  last study 2009  . 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 CARDIOVERSION N/A 12/06/2014   Procedure: CARDIOVERSION;  Surgeon: Gary Latch, MD;  Location: [place];  Service: Cardiovascular;  Laterality: N/A;  . CARDIOVERSION N/A 05/26/2017     Procedure: CARDIOVERSION;  Surgeon: Gary Latch, MD;  Location: [facility];  Service: Cardiovascular;  Laterality: N/A;  . CARDIOVERSION N/A 04/25/2019   Procedure: CARDIOVERSION;  Surgeon: Gary Klein, MD;  Location: [facility];  Service: Cardiovascular;  Laterality: N/A;  . CARPAL TUNNEL RELEASE    . CORONARY ANGIOPLASTY     multiple stents  . CORONARY ARTERY BYPASS GRAFT  2006  . EYE SURGERY     right eye-  muscular repair  . TOTAL HIP ARTHROPLASTY  01/03/2012   Procedure: TOTAL HIP ARTHROPLASTY;  Surgeon: Gary Bastos, MD;  Location: [facility];  Service: Orthopedics;  Laterality: Right;     Current Outpatient Medications  Medication Sig Dispense Refill  . Alirocumab (PRALUENT) 150 MG/ML SOAJ Inject 150 mg into the skin every 14 (fourteen) days. 2 pen 11  . ALPRAZolam (XANAX) 1 MG tablet Take 1 mg by mouth at bedtime.   1  . amLODipine (NORVASC) 2.5 MG tablet Take 1 tablet (2.5 mg total) by mouth daily. 90 tablet 3  . calcium carbonate (CALCIUM 600) 600 MG TABS tablet Take 600 mg by mouth daily with breakfast.    . carvedilol (COREG) 25 MG tablet Take 1 tablet (25 mg total) by mouth 2 (two) times daily. 180 tablet 3  . cetirizine (ZYRTEC) 10 MG tablet Take 10 mg by mouth daily.    . Cholecalciferol (VITAMIN D3) 125 MCG (5000 UT) TABS Take 5,000 Units by mouth daily.    . Coenzyme Q10 (CO Q 10) 100 MG CAPS  Take 300 mg by mouth daily.     . hydrochlorothiazide (HYDRODIURIL) 12.5 MG tablet TAKE 1 TABLET BY MOUTH EVERY DAY 90 tablet 3  . levothyroxine (SYNTHROID, LEVOTHROID) 137 MCG tablet Take 137-205.5 mcg by mouth See admin instructions. Take 205.5 mcg by mouth on Wednesday.  Take 137 mcg by mouth daily on all other days    . losartan (COZAAR) 100 MG tablet TAKE 1 TABLET BY MOUTH EVERY DAY 90 tablet 3  . metFORMIN (GLUCOPHAGE) 500 MG tablet Take 500 mg by mouth 2 (two) times daily.  12  . Multiple Vitamin (MULTIVITAMIN WITH MINERALS) TABS tablet Take 1 tablet by mouth  daily. Centrum silver    . prednisoLONE acetate (PRED FORTE) 1 % ophthalmic suspension Place 1 drop into the right eye as needed (if feel like sand in eye).    . predniSONE (DELTASONE) 5 MG tablet Take 5 mg by mouth as needed (for wagoners).    . riTUXimab (RITUXAN IV) Inject 1 application into the vein every 6 (six) months.     . sodium chloride (OCEAN) 0.65 % SOLN nasal spray Place 1 spray into both nostrils daily as needed for congestion.     Marland Kitchen testosterone cypionate (DEPOTESTOSTERONE CYPIONATE) 200 MG/ML injection Inject 50 mg into the muscle every 7 (seven) days.     Marland Kitchen venlafaxine XR (EFFEXOR-XR) 150 MG 24 hr capsule Take 150 mg by mouth daily.     Marland Kitchen warfarin (COUMADIN) 5 MG tablet TAKE 1 TABLET BY MOUTH EVERY DAY 90 tablet 1   No current facility-administered medications for this visit.    Allergies:   Statins    Social History:  The patient  reports that he quit smoking about 35 years ago. His smoking use included cigarettes. He has never used smokeless tobacco. He reports that he does not drink alcohol and does not use drugs.   Family History:  The patient's family history includes Heart attack in his maternal grandfather, mother, paternal grandfather, and paternal grandmother; Other in his father; Stroke in his maternal grandmother.    ROS:  Please see the history of present illness.   Otherwise, review of systems are positive for none.   All other systems are reviewed and negative.    PHYSICAL EXAM: VS:  BP 124/60 (BP Location: Left Arm, Patient Position: Sitting, Cuff Size: Normal)   Pulse 69   Ht 5\' 10"  (1.778 m)   Wt 225 lb (102.1 kg)   BMI 32.28 kg/m  , BMI Body mass index is 32.28 kg/m. GEN: Well nourished, well developed, in no acute distress  HEENT: normal  Neck: no JVD, carotid bruits, or masses Cardiac: RRR; no murmurs, rubs, or gallops,no edema  Respiratory:  clear to auscultation bilaterally, normal work of breathing GI: soft, nontender, nondistended, +  BS MS: no deformity or atrophy  Skin: warm and dry, no rash Neuro:  Strength and sensation are intact Psych: euthymic mood, full affect Vascular: Femoral pulses slightly diminished bilaterally.  Dorsalis pedis is +1 bilaterally and posterior tibial is +2 bilaterally.  EKG:  EKG is not ordered today.    Recent Labs: 04/19/2019: BUN 26; Creatinine, Ser 0.79; Hemoglobin 14.3; Platelets 374; Potassium 4.8; Sodium 141    Lipid Panel    Component Value Date/Time   CHOL 118 11/16/2018 0817   TRIG 99 11/16/2018 0817   HDL 40 11/16/2018 0817   CHOLHDL 3.0 11/16/2018 0817   CHOLHDL 5.4 (H) 02/19/2015 0821   VLDL 22 02/19/2015 0821   LDLCALC 59  11/16/2018 0817      Wt Readings from Last 3 Encounters:  10/23/19 225 lb (102.1 kg)  09/11/19 225 lb (102.1 kg)  06/11/19 237 lb 3.2 oz (107.6 kg)       No flowsheet data found.    ASSESSMENT AND PLAN:  1.  Peripheral arterial disease: Bilateral leg fatigue without pain.  This does not seem to be due to claudication.  He only has disease affecting the anterior tibial artery which could cause foot claudication but he does not have that.  In addition, by physical exam, his pulses are almost all well preserved bilaterally. There is nothing to suggest iliac disease given triphasic waveforms throughout. Anterior tibial artery disease should be treated medically and there is no indication for revascularization.  2.  Paroxysmal atrial fibrillation: He seems to be maintaining in sinus rhythm and on long-term anticoagulation with warfarin.  3.  Coronary artery disease involving native coronary arteries without angina: Continue medical therapy.  4.  Essential hypertension: Blood pressure is controlled.  5.  Hyperlipidemia: He is on Praluent.   Disposition:   FU with me as needed.  Signed,  Kathlyn Sacramento, MD  10/23/2019 11:02 AM    [place]

## 2019-10-23 NOTE — Patient Instructions (Signed)

## 2019-11-26 DIAGNOSIS — E785 Hyperlipidemia, unspecified: Secondary | ICD-10-CM

## 2019-12-03 ENCOUNTER — Other Ambulatory Visit: Payer: Self-pay

## 2019-12-03 ENCOUNTER — Ambulatory Visit (INDEPENDENT_AMBULATORY_CARE_PROVIDER_SITE_OTHER): Payer: Medicare Other

## 2019-12-03 DIAGNOSIS — Z7901 Long term (current) use of anticoagulants: Secondary | ICD-10-CM

## 2019-12-03 DIAGNOSIS — I480 Paroxysmal atrial fibrillation: Secondary | ICD-10-CM | POA: Diagnosis not present

## 2019-12-03 DIAGNOSIS — E785 Hyperlipidemia, unspecified: Secondary | ICD-10-CM | POA: Diagnosis not present

## 2019-12-03 LAB — HEPATIC FUNCTION PANEL
ALT: 25 IU/L (ref 0–44)
AST: 27 IU/L (ref 0–40)
Albumin: 4.1 g/dL (ref 3.7–4.7)
Alkaline Phosphatase: 118 IU/L (ref 44–121)
Bilirubin Total: 0.3 mg/dL (ref 0.0–1.2)
Bilirubin, Direct: 0.11 mg/dL (ref 0.00–0.40)
Total Protein: 6.5 g/dL (ref 6.0–8.5)

## 2019-12-03 LAB — LIPID PANEL
Chol/HDL Ratio: 3.5 ratio (ref 0.0–5.0)
Cholesterol, Total: 170 mg/dL (ref 100–199)
HDL: 48 mg/dL (ref >39)
LDL Chol Calc (NIH): 84 mg/dL (ref 0–99)
Triglycerides: 231 mg/dL — ABNORMAL HIGH (ref 0–149)
VLDL Cholesterol Cal: 38 mg/dL (ref 5–40)

## 2019-12-03 LAB — POCT INR: INR: 1.7 — AB (ref 2.0–3.0)

## 2019-12-03 NOTE — Progress Notes (Signed)
This encounter was created in error - please disregard.

## 2019-12-03 NOTE — Patient Instructions (Signed)
Take 2 tablets today and then Continue taking warfarin 5mg  daily and repeat INR in 4 weeks.  CHADS-VASc = 4

## 2019-12-04 ENCOUNTER — Telehealth: Payer: Self-pay

## 2019-12-04 NOTE — Telephone Encounter (Signed)
Called and spoke w/pt regarding the need to switch to repatha, pt assistance paperwork for repatha placed at the front desk, pt voiced understanding

## 2019-12-07 DIAGNOSIS — E669 Obesity, unspecified: Secondary | ICD-10-CM | POA: Diagnosis not present

## 2019-12-07 DIAGNOSIS — Z6832 Body mass index (BMI) 32.0-32.9, adult: Secondary | ICD-10-CM | POA: Diagnosis not present

## 2019-12-07 DIAGNOSIS — Z7952 Long term (current) use of systemic steroids: Secondary | ICD-10-CM | POA: Diagnosis not present

## 2019-12-07 DIAGNOSIS — M3130 Wegener's granulomatosis without renal involvement: Secondary | ICD-10-CM | POA: Diagnosis not present

## 2019-12-07 DIAGNOSIS — M6281 Muscle weakness (generalized): Secondary | ICD-10-CM | POA: Diagnosis not present

## 2019-12-19 ENCOUNTER — Telehealth: Payer: Self-pay | Admitting: *Deleted

## 2019-12-19 DIAGNOSIS — I10 Essential (primary) hypertension: Secondary | ICD-10-CM

## 2019-12-19 DIAGNOSIS — E785 Hyperlipidemia, unspecified: Secondary | ICD-10-CM

## 2019-12-19 DIAGNOSIS — Z5181 Encounter for therapeutic drug level monitoring: Secondary | ICD-10-CM

## 2019-12-19 NOTE — Telephone Encounter (Signed)
-----   Message from [name], MD sent at 12/19/2019  8:53 AM EST ----- Cholesterol numbers are creeping up again.  Triglycerides are >150 and LDL is >70, which are our goals.  Is he still on Repatha.

## 2019-12-19 NOTE — Telephone Encounter (Signed)
Advised patient of lab results  Patient had been off medications secondary to cost but has resumed Repatha  Will recheck labs in 3 months

## 2019-12-20 NOTE — Telephone Encounter (Signed)
OK - thanks

## 2019-12-31 ENCOUNTER — Other Ambulatory Visit: Payer: Self-pay

## 2019-12-31 ENCOUNTER — Ambulatory Visit (INDEPENDENT_AMBULATORY_CARE_PROVIDER_SITE_OTHER): Payer: Medicare Other

## 2019-12-31 DIAGNOSIS — I480 Paroxysmal atrial fibrillation: Secondary | ICD-10-CM

## 2019-12-31 DIAGNOSIS — Z7901 Long term (current) use of anticoagulants: Secondary | ICD-10-CM

## 2019-12-31 LAB — POCT INR: INR: 2.6 (ref 2.0–3.0)

## 2019-12-31 NOTE — Patient Instructions (Signed)
Continue taking warfarin 5mg  daily and repeat INR in 6 weeks.  CHADS-VASc = 4

## 2020-01-11 ENCOUNTER — Other Ambulatory Visit: Payer: Self-pay | Admitting: Cardiovascular Disease

## 2020-01-21 ENCOUNTER — Other Ambulatory Visit: Payer: Self-pay

## 2020-01-21 MED ORDER — EVOLOCUMAB 140 MG/ML ~~LOC~~ SOAJ: 140.0000 mg | SUBCUTANEOUS | 3 refills | Status: DC

## 2020-01-28 DIAGNOSIS — E039 Hypothyroidism, unspecified: Secondary | ICD-10-CM | POA: Diagnosis not present

## 2020-01-28 DIAGNOSIS — I25729 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: Secondary | ICD-10-CM | POA: Diagnosis not present

## 2020-01-28 DIAGNOSIS — E1169 Type 2 diabetes mellitus with other specified complication: Secondary | ICD-10-CM | POA: Diagnosis not present

## 2020-01-28 DIAGNOSIS — I10 Essential (primary) hypertension: Secondary | ICD-10-CM | POA: Diagnosis not present

## 2020-01-28 DIAGNOSIS — Z7952 Long term (current) use of systemic steroids: Secondary | ICD-10-CM | POA: Diagnosis not present

## 2020-01-28 DIAGNOSIS — G4733 Obstructive sleep apnea (adult) (pediatric): Secondary | ICD-10-CM | POA: Diagnosis not present

## 2020-01-28 DIAGNOSIS — M3130 Wegener's granulomatosis without renal involvement: Secondary | ICD-10-CM | POA: Diagnosis not present

## 2020-01-28 DIAGNOSIS — I4891 Unspecified atrial fibrillation: Secondary | ICD-10-CM | POA: Diagnosis not present

## 2020-01-28 DIAGNOSIS — R195 Other fecal abnormalities: Secondary | ICD-10-CM | POA: Diagnosis not present

## 2020-01-28 DIAGNOSIS — I5022 Chronic systolic (congestive) heart failure: Secondary | ICD-10-CM | POA: Diagnosis not present

## 2020-01-28 DIAGNOSIS — E785 Hyperlipidemia, unspecified: Secondary | ICD-10-CM | POA: Diagnosis not present

## 2020-01-28 DIAGNOSIS — E291 Testicular hypofunction: Secondary | ICD-10-CM | POA: Diagnosis not present

## 2020-02-07 DIAGNOSIS — B0052 Herpesviral keratitis: Secondary | ICD-10-CM | POA: Diagnosis not present

## 2020-02-07 DIAGNOSIS — H2512 Age-related nuclear cataract, left eye: Secondary | ICD-10-CM | POA: Diagnosis not present

## 2020-02-07 DIAGNOSIS — H40033 Anatomical narrow angle, bilateral: Secondary | ICD-10-CM | POA: Diagnosis not present

## 2020-02-08 DIAGNOSIS — E291 Testicular hypofunction: Secondary | ICD-10-CM | POA: Diagnosis not present

## 2020-02-11 ENCOUNTER — Ambulatory Visit (INDEPENDENT_AMBULATORY_CARE_PROVIDER_SITE_OTHER): Payer: Medicare Other

## 2020-02-11 ENCOUNTER — Other Ambulatory Visit: Payer: Self-pay

## 2020-02-11 DIAGNOSIS — I480 Paroxysmal atrial fibrillation: Secondary | ICD-10-CM | POA: Diagnosis not present

## 2020-02-11 DIAGNOSIS — Z7901 Long term (current) use of anticoagulants: Secondary | ICD-10-CM | POA: Diagnosis not present

## 2020-02-11 LAB — POCT INR: INR: 2.2 (ref 2.0–3.0)

## 2020-02-11 NOTE — Patient Instructions (Signed)
Continue taking warfarin 5mg  daily and repeat INR in 6 weeks.  CHADS-VASc = 4

## 2020-02-25 DIAGNOSIS — Z7952 Long term (current) use of systemic steroids: Secondary | ICD-10-CM | POA: Diagnosis not present

## 2020-03-04 DIAGNOSIS — L812 Freckles: Secondary | ICD-10-CM | POA: Diagnosis not present

## 2020-03-04 DIAGNOSIS — L821 Other seborrheic keratosis: Secondary | ICD-10-CM | POA: Diagnosis not present

## 2020-03-04 DIAGNOSIS — Z85820 Personal history of malignant melanoma of skin: Secondary | ICD-10-CM | POA: Diagnosis not present

## 2020-03-04 DIAGNOSIS — D1801 Hemangioma of skin and subcutaneous tissue: Secondary | ICD-10-CM | POA: Diagnosis not present

## 2020-03-04 DIAGNOSIS — D0359 Melanoma in situ of other part of trunk: Secondary | ICD-10-CM | POA: Diagnosis not present

## 2020-03-04 DIAGNOSIS — D485 Neoplasm of uncertain behavior of skin: Secondary | ICD-10-CM | POA: Diagnosis not present

## 2020-03-04 DIAGNOSIS — Z85828 Personal history of other malignant neoplasm of skin: Secondary | ICD-10-CM | POA: Diagnosis not present

## 2020-03-05 ENCOUNTER — Other Ambulatory Visit: Payer: Self-pay | Admitting: Cardiovascular Disease

## 2020-03-07 DIAGNOSIS — Z6833 Body mass index (BMI) 33.0-33.9, adult: Secondary | ICD-10-CM | POA: Diagnosis not present

## 2020-03-07 DIAGNOSIS — R5383 Other fatigue: Secondary | ICD-10-CM | POA: Diagnosis not present

## 2020-03-07 DIAGNOSIS — M6281 Muscle weakness (generalized): Secondary | ICD-10-CM | POA: Diagnosis not present

## 2020-03-07 DIAGNOSIS — Z7952 Long term (current) use of systemic steroids: Secondary | ICD-10-CM | POA: Diagnosis not present

## 2020-03-07 DIAGNOSIS — M3130 Wegener's granulomatosis without renal involvement: Secondary | ICD-10-CM | POA: Diagnosis not present

## 2020-03-07 DIAGNOSIS — E669 Obesity, unspecified: Secondary | ICD-10-CM | POA: Diagnosis not present

## 2020-03-21 ENCOUNTER — Other Ambulatory Visit: Payer: Self-pay | Admitting: Student-PharmD

## 2020-03-21 NOTE — Progress Notes (Signed)
Opened in error

## 2020-03-24 ENCOUNTER — Other Ambulatory Visit: Payer: Self-pay

## 2020-03-24 ENCOUNTER — Ambulatory Visit (INDEPENDENT_AMBULATORY_CARE_PROVIDER_SITE_OTHER): Payer: Medicare Other | Admitting: Pharmacist Clinician (PhC)/ Clinical Pharmacy Specialist

## 2020-03-24 DIAGNOSIS — I480 Paroxysmal atrial fibrillation: Secondary | ICD-10-CM | POA: Diagnosis not present

## 2020-03-24 DIAGNOSIS — E785 Hyperlipidemia, unspecified: Secondary | ICD-10-CM | POA: Diagnosis not present

## 2020-03-24 DIAGNOSIS — Z5181 Encounter for therapeutic drug level monitoring: Secondary | ICD-10-CM | POA: Diagnosis not present

## 2020-03-24 DIAGNOSIS — I10 Essential (primary) hypertension: Secondary | ICD-10-CM | POA: Diagnosis not present

## 2020-03-24 DIAGNOSIS — Z7901 Long term (current) use of anticoagulants: Secondary | ICD-10-CM

## 2020-03-24 DIAGNOSIS — I4819 Other persistent atrial fibrillation: Secondary | ICD-10-CM

## 2020-03-24 LAB — COMPREHENSIVE METABOLIC PANEL
ALT: 16 IU/L (ref 0–44)
AST: 18 IU/L (ref 0–40)
Albumin/Globulin Ratio: 1.8 (ref 1.2–2.2)
Albumin: 4.2 g/dL (ref 3.7–4.7)
Alkaline Phosphatase: 87 IU/L (ref 44–121)
BUN/Creatinine Ratio: 28 — ABNORMAL HIGH (ref 10–24)
BUN: 25 mg/dL (ref 8–27)
Bilirubin Total: 0.2 mg/dL (ref 0.0–1.2)
CO2: 25 mmol/L (ref 20–29)
Calcium: 9.5 mg/dL (ref 8.6–10.2)
Chloride: 105 mmol/L (ref 96–106)
Creatinine, Ser: 0.9 mg/dL (ref 0.76–1.27)
Globulin, Total: 2.4 g/dL (ref 1.5–4.5)
Glucose: 105 mg/dL — ABNORMAL HIGH (ref 65–99)
Potassium: 4.1 mmol/L (ref 3.5–5.2)
Sodium: 144 mmol/L (ref 134–144)
Total Protein: 6.6 g/dL (ref 6.0–8.5)
eGFR: 89 mL/min/{1.73_m2} (ref >59)

## 2020-03-24 LAB — LIPID PANEL
Chol/HDL Ratio: 4.6 ratio (ref 0.0–5.0)
Cholesterol, Total: 228 mg/dL — ABNORMAL HIGH (ref 100–199)
HDL: 50 mg/dL (ref >39)
LDL Chol Calc (NIH): 152 mg/dL — ABNORMAL HIGH (ref 0–99)
Triglycerides: 142 mg/dL (ref 0–149)
VLDL Cholesterol Cal: 26 mg/dL (ref 5–40)

## 2020-03-24 LAB — POCT INR: INR: 2.4 (ref 2.0–3.0)

## 2020-04-01 ENCOUNTER — Other Ambulatory Visit: Payer: Self-pay

## 2020-04-01 ENCOUNTER — Encounter (HOSPITAL_BASED_OUTPATIENT_CLINIC_OR_DEPARTMENT_OTHER): Payer: Self-pay

## 2020-04-01 ENCOUNTER — Emergency Department (HOSPITAL_BASED_OUTPATIENT_CLINIC_OR_DEPARTMENT_OTHER)
Admission: EM | Admit: 2020-04-01 | Discharge: 2020-04-01 | Disposition: A | Discharge status: Home / Self Care | Payer: Medicare Other | Attending: Emergency Medicine | Admitting: Emergency Medicine

## 2020-04-01 ENCOUNTER — Emergency Department (HOSPITAL_BASED_OUTPATIENT_CLINIC_OR_DEPARTMENT_OTHER): Payer: Medicare Other

## 2020-04-01 DIAGNOSIS — S0083XA Contusion of other part of head, initial encounter: Secondary | ICD-10-CM

## 2020-04-01 DIAGNOSIS — Z87891 Personal history of nicotine dependence: Secondary | ICD-10-CM | POA: Insufficient documentation

## 2020-04-01 DIAGNOSIS — Z96641 Presence of right artificial hip joint: Secondary | ICD-10-CM | POA: Diagnosis not present

## 2020-04-01 DIAGNOSIS — Z79899 Other long term (current) drug therapy: Secondary | ICD-10-CM | POA: Insufficient documentation

## 2020-04-01 DIAGNOSIS — S0231XA Fracture of orbital floor, right side, initial encounter for closed fracture: Secondary | ICD-10-CM | POA: Insufficient documentation

## 2020-04-01 DIAGNOSIS — S0591XA Unspecified injury of right eye and orbit, initial encounter: Secondary | ICD-10-CM | POA: Diagnosis present

## 2020-04-01 DIAGNOSIS — W01198A Fall on same level from slipping, tripping and stumbling with subsequent striking against other object, initial encounter: Secondary | ICD-10-CM | POA: Diagnosis not present

## 2020-04-01 DIAGNOSIS — I110 Hypertensive heart disease with heart failure: Secondary | ICD-10-CM | POA: Diagnosis not present

## 2020-04-01 DIAGNOSIS — I2510 Atherosclerotic heart disease of native coronary artery without angina pectoris: Secondary | ICD-10-CM | POA: Insufficient documentation

## 2020-04-01 DIAGNOSIS — I5042 Chronic combined systolic (congestive) and diastolic (congestive) heart failure: Secondary | ICD-10-CM | POA: Insufficient documentation

## 2020-04-01 DIAGNOSIS — Z951 Presence of aortocoronary bypass graft: Secondary | ICD-10-CM | POA: Insufficient documentation

## 2020-04-01 DIAGNOSIS — Z85828 Personal history of other malignant neoplasm of skin: Secondary | ICD-10-CM | POA: Insufficient documentation

## 2020-04-01 DIAGNOSIS — S0993XA Unspecified injury of face, initial encounter: Secondary | ICD-10-CM | POA: Diagnosis not present

## 2020-04-01 DIAGNOSIS — S0285XA Fracture of orbit, unspecified, initial encounter for closed fracture: Secondary | ICD-10-CM

## 2020-04-01 NOTE — ED Notes (Signed)
Patient up to restroom awalking without difficulty.  Denies any dizziness.  Bp elevated  PA aware.  Patient states usually takes BP med's at this time

## 2020-04-01 NOTE — ED Notes (Signed)
States tripped and fell last Sunday hitting right side face.  Noted bruising to eye with swelling.  Able to open eye.  Denies LOC.  States aching in chest Denies pain with deep breath.

## 2020-04-01 NOTE — ED Notes (Signed)
BEFAST negative

## 2020-04-01 NOTE — ED Triage Notes (Addendum)
Pt states he fell 3/27 morning-c/o numbness to nose-bruising noted around right eye-no LOC with fall-reports pain site id "chest is sore"-states he takes coumadin-NAD-steady gait

## 2020-04-01 NOTE — ED Notes (Signed)
Patient states at 75 years old had arrow injury to right eye  States only see shadows.

## 2020-04-01 NOTE — Discharge Instructions (Signed)
Return if any problems.

## 2020-04-02 DIAGNOSIS — M3130 Wegener's granulomatosis without renal involvement: Secondary | ICD-10-CM | POA: Diagnosis not present

## 2020-04-02 DIAGNOSIS — R5383 Other fatigue: Secondary | ICD-10-CM | POA: Diagnosis not present

## 2020-04-02 NOTE — ED Provider Notes (Signed)
[facility] EMERGENCY DEPARTMENT Provider Note   CSN: [id number] Arrival date & time: 04/01/20  1723     History Chief Complaint  Patient presents with  . Fall    Gary [name] is a 75 y.o. male.  The history is provided by the patient. No language interpreter was used.  Fall This is a new problem. Episode onset: 3 days ago. The problem occurs constantly. The problem has not changed since onset.Nothing aggravates the symptoms. Nothing relieves the symptoms. He has tried nothing for the symptoms. The treatment provided no relief.  Pt complains of having fallen and hit his face.  Pt reports he has some pain in his face and a headache.  Pt denies loss of conciousness.  Pt is on a blood thinner.     Past Medical History:  Diagnosis Date  . Aortic aneurysm ([place]) 02/20/2016   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  . Arthritis   . Atrial fibrillation ([place]) 11/13/2014  . Cancer ([place])    skin  . 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 Coronary artery disease   . Depression   . Essential hypertension 11/13/2014  . Hyperlipidemia   . Hyperlipidemia 11/13/2014  . Hypertension   . Sleep apnea    CPAP 'Uses half the time"  last study 2009  . 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 Long term (current) use of anticoagulants 02/12/2019  . 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 Acute respiratory failure ([place]) 05/03/2017  . Pulmonary edema cardiac cause ([place]) 05/03/2017  . Respiratory failure ([place]) 05/03/2017  . 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 Aortic aneurysm ([place]) 02/20/2016  . CAD (coronary artery disease) 11/13/2014  . Essential hypertension 11/13/2014  . Hyperlipidemia 11/13/2014  . Atrial fibrillation ([place]) 11/13/2014  . Osteoarthritis of right hip 01/03/2012    Past Surgical History:  Procedure Laterality Date  .  CARDIOVERSION N/A 12/06/2014   Procedure: CARDIOVERSION;  Surgeon: [name], MD;  Location: [facility];  Service: Cardiovascular;  Laterality: N/A;  . CARDIOVERSION N/A 05/26/2017   Procedure: CARDIOVERSION;  Surgeon: [name], MD;  Location: [facility] ENDOSCOPY;  Service: Cardiovascular;  Laterality: N/A;  . CARDIOVERSION N/A 04/25/2019   Procedure: CARDIOVERSION;  Surgeon: Sanda Klein, MD;  Location: [facility] ENDOSCOPY;  Service: Cardiovascular;  Laterality: N/A;  . CARPAL TUNNEL RELEASE    . CORONARY ANGIOPLASTY     multiple stents  . CORONARY ARTERY BYPASS GRAFT  2006  . EYE SURGERY     right eye-  muscular repair  . TOTAL HIP ARTHROPLASTY  01/03/2012   Procedure: TOTAL HIP ARTHROPLASTY;  Surgeon: [name], MD;  Location: [facility];  Service: Orthopedics;  Laterality: Right;       Family History  Problem Relation Age of Onset  . Heart attack Mother   . Other Father        odd stomach disorder  . Stroke Maternal Grandmother   . Heart attack Maternal Grandfather   . Heart attack Paternal Grandmother   . Heart attack Paternal Grandfather     Social History   Tobacco Use  . Smoking status: Former Smoker    Types: Cigarettes    Quit date: 12/24/1983    Years since quitting: 36.2  . Smokeless tobacco: Never Used  Vaping Use  . Vaping Use: Never used  Substance Use Topics  . Alcohol use: No  Alcohol/week: 0.0 standard drinks  . Drug use: No    Home Medications Prior to Admission medications   Medication Sig Start Date End Date Taking? Authorizing Provider  ALPRAZolam Duanne Moron) 1 MG tablet Take 1 mg by mouth at bedtime.  05/09/17   [provider]  amLODipine (NORVASC) 2.5 MG tablet TAKE 1 TABLET BY MOUTH EVERY DAY 01/11/20   [name], MD  calcium carbonate (CALCIUM 600) 600 MG TABS tablet Take 600 mg by mouth daily with breakfast.    [provider]  carvedilol (COREG) 25 MG tablet TAKE 1 TABLET BY MOUTH TWICE A DAY 03/05/20   [name], MD  cetirizine (ZYRTEC) 10 MG tablet Take 10 mg by mouth daily.    [provider]  Cholecalciferol (VITAMIN D3) 125 MCG (5000 UT) TABS Take 5,000 Units by mouth daily.    [provider]  Coenzyme Q10 (CO Q 10) 100 MG CAPS Take 300 mg by mouth daily.     [provider]  Evolocumab 140 MG/ML SOAJ Inject 140 mg into the skin every 14 (fourteen) days. Inject 1 pen into the fatty tissue skin every 14 day 01/21/20   [name], MD  hydrochlorothiazide (HYDRODIURIL) 12.5 MG tablet TAKE 1 TABLET BY MOUTH EVERY DAY 10/08/19   [name], MD  levothyroxine (SYNTHROID, LEVOTHROID) 137 MCG tablet Take 137-205.5 mcg by mouth See admin instructions. Take 205.5 mcg by mouth on Wednesday.  Take 137 mcg by mouth daily on all other days 10/27/14   [provider]  losartan (COZAAR) 100 MG tablet TAKE 1 TABLET BY MOUTH EVERY DAY 09/24/19   [name], MD  metFORMIN (GLUCOPHAGE) 500 MG tablet Take 500 mg by mouth 2 (two) times daily. 10/27/17   [provider]  Multiple Vitamin (MULTIVITAMIN WITH MINERALS) TABS tablet Take 1 tablet by mouth daily. Centrum silver    [provider]  prednisoLONE acetate (PRED FORTE) 1 % ophthalmic suspension Place 1 drop into the right eye as needed (if feel like sand in eye).    [provider]  predniSONE (DELTASONE) 5 MG tablet Take 5 mg by mouth as needed (for wagoners).    [provider]  riTUXimab (RITUXAN IV) Inject 1 application into the vein every 6 (six) months.     [provider]  sodium chloride (OCEAN) 0.65 % SOLN nasal spray Place 1 spray into both nostrils daily as needed for congestion.     [provider]  testosterone cypionate (DEPOTESTOSTERONE CYPIONATE) 200 MG/ML injection Inject 50 mg into the muscle every 7 (seven) days.  10/09/14   [provider]  venlafaxine XR (EFFEXOR-XR) 150 MG 24 hr capsule Take 150 mg by mouth daily.      [provider]  warfarin (COUMADIN) 5 MG tablet TAKE 1 TABLET BY MOUTH EVERY DAY 03/05/20   [name], MD    Allergies    Statins  Review of Systems   Review of Systems  All other systems reviewed and are negative.   Physical Exam Updated Vital Signs BP (!) 178/106 (BP Location: Right Arm)   Pulse 82   Temp 98.1 F (36.7 C) (Oral) Comment: vitals obtained by EMT Fernando  Resp 18   Ht 5\' 10"  (1.778 m)   Wt 105.2 kg   SpO2 97%   BMI 33.29 kg/m   Physical Exam Vitals and nursing note reviewed.  Constitutional:      Appearance: He is well-developed.  HENT:     Head: Normocephalic.  Comments: Bruising right forehead and below right eye     Right Ear: Tympanic membrane normal.     Left Ear: Tympanic membrane normal.  Eyes:     Conjunctiva/sclera: Conjunctivae normal.  Cardiovascular:     Rate and Rhythm: Normal rate and regular rhythm.     Heart sounds: No murmur heard.   Pulmonary:     Effort: Pulmonary effort is normal. No respiratory distress.     Breath sounds: Normal breath sounds.  Abdominal:     General: Abdomen is flat.     Palpations: Abdomen is soft.     Tenderness: There is no abdominal tenderness.  Musculoskeletal:        General: Normal range of motion.     Cervical back: Neck supple.  Skin:    General: Skin is warm and dry.  Neurological:     General: No focal deficit present.     Mental Status: He is alert.  Psychiatric:        Mood and Affect: Mood normal.     ED Results / Procedures / Treatments   Labs (all labs ordered are listed, but only abnormal results are displayed) Labs Reviewed - No data to display  EKG None  Radiology CT Head Wo Contrast  Result Date: 04/01/2020 CLINICAL DATA:  Facial trauma, right periorbital pain and swelling EXAM: CT HEAD WITHOUT CONTRAST TECHNIQUE: Contiguous axial images were obtained from the base of the skull through the vertex without intravenous contrast. COMPARISON:  None.  FINDINGS: Brain: No acute infarct or hemorrhage. The lateral ventricles and midline structures are unremarkable. No acute extra-axial fluid collections. No mass effect. Vascular: No hyperdense vessel or unexpected calcification. Skull: Normal. Negative for fracture or focal lesion. Sinuses/Orbits: No acute finding. Other: None. IMPRESSION: 1. No acute intracranial process. Electronically Signed   By: Randa Ngo M.D.   On: 04/01/2020 18:53   CT Orbits Wo Contrast  Result Date: 04/01/2020 CLINICAL DATA:  Facial trauma, right periorbital pain and swelling EXAM: CT ORBITS WITHOUT CONTRAST TECHNIQUE: Multidetector CT images were obtained using the standard protocol without intravenous contrast. COMPARISON:  None. FINDINGS: Orbits: There is a mildly comminuted and displaced fracture of the inferior wall the right orbit. No extraocular muscle entrapment. There are no other acute displaced fractures. Visualized sinuses: Mild mucosal thickening within the right maxillary sinus. Remaining sinuses are clear. Soft tissues: Mild right infraorbital soft tissue swelling. Remaining soft tissues are normal. Limited intracranial: No significant or unexpected finding. IMPRESSION: 1. Right orbital floor fracture, with no evidence of extraocular muscle entrapment. 2. Mild right infraorbital soft tissue swelling. Electronically Signed   By: Randa Ngo M.D.   On: 04/01/2020 18:59    Procedures Procedures   Medications Ordered in ED Medications - No data to display  ED Course  I have reviewed the triage vital signs and the nursing notes.  Pertinent labs & imaging results that were available during my care of the patient were reviewed by me and considered in my medical decision making (see chart for details).    MDM Rules/Calculators/A&P                          MDM:  Head ct is negative,  Orbital floor fracture on orbit film,  Pt counseled on results.  Pt advised tylenol for pain.  Follow up with Ent.  Pt also  sees Dr. Prudencio Burly.  He will see him for recheck.  Pt is blind in right  eye.  No sign of impingement  Final Clinical Impression(s) / ED Diagnoses Final diagnoses:  Contusion of face, initial encounter  Orbital fracture, closed, initial encounter ([place])    Rx / DC Orders ED Discharge Orders    None    An After Visit Summary was printed and given to the patient.    Fransico Meadow, [place] 04/02/20 1929    Tegeler, Gwenyth Allegra, MD 04/03/20 [phone]

## 2020-04-03 DIAGNOSIS — D0359 Melanoma in situ of other part of trunk: Secondary | ICD-10-CM | POA: Diagnosis not present

## 2020-04-03 DIAGNOSIS — Z85828 Personal history of other malignant neoplasm of skin: Secondary | ICD-10-CM | POA: Diagnosis not present

## 2020-04-03 DIAGNOSIS — Z85820 Personal history of malignant melanoma of skin: Secondary | ICD-10-CM | POA: Diagnosis not present

## 2020-04-11 DIAGNOSIS — E1169 Type 2 diabetes mellitus with other specified complication: Secondary | ICD-10-CM | POA: Diagnosis not present

## 2020-04-11 DIAGNOSIS — E870 Hyperosmolality and hypernatremia: Secondary | ICD-10-CM | POA: Diagnosis not present

## 2020-04-11 DIAGNOSIS — E785 Hyperlipidemia, unspecified: Secondary | ICD-10-CM | POA: Diagnosis not present

## 2020-04-11 DIAGNOSIS — I25729 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: Secondary | ICD-10-CM | POA: Diagnosis not present

## 2020-04-11 DIAGNOSIS — I10 Essential (primary) hypertension: Secondary | ICD-10-CM | POA: Diagnosis not present

## 2020-04-11 DIAGNOSIS — E291 Testicular hypofunction: Secondary | ICD-10-CM | POA: Diagnosis not present

## 2020-04-11 DIAGNOSIS — I4891 Unspecified atrial fibrillation: Secondary | ICD-10-CM | POA: Diagnosis not present

## 2020-04-11 DIAGNOSIS — M3130 Wegener's granulomatosis without renal involvement: Secondary | ICD-10-CM | POA: Diagnosis not present

## 2020-04-11 DIAGNOSIS — E039 Hypothyroidism, unspecified: Secondary | ICD-10-CM | POA: Diagnosis not present

## 2020-04-11 DIAGNOSIS — W19XXXD Unspecified fall, subsequent encounter: Secondary | ICD-10-CM | POA: Diagnosis not present

## 2020-04-11 DIAGNOSIS — D649 Anemia, unspecified: Secondary | ICD-10-CM | POA: Diagnosis not present

## 2020-04-11 DIAGNOSIS — R195 Other fecal abnormalities: Secondary | ICD-10-CM | POA: Diagnosis not present

## 2020-05-05 ENCOUNTER — Ambulatory Visit (INDEPENDENT_AMBULATORY_CARE_PROVIDER_SITE_OTHER): Payer: Medicare Other

## 2020-05-05 ENCOUNTER — Other Ambulatory Visit: Payer: Self-pay

## 2020-05-05 DIAGNOSIS — Z7901 Long term (current) use of anticoagulants: Secondary | ICD-10-CM | POA: Diagnosis not present

## 2020-05-05 DIAGNOSIS — I480 Paroxysmal atrial fibrillation: Secondary | ICD-10-CM | POA: Diagnosis not present

## 2020-05-05 LAB — POCT INR: INR: 2.7 (ref 2.0–3.0)

## 2020-05-05 NOTE — Patient Instructions (Signed)
Continue taking warfarin 5mg daily and repeat INR in 6 weeks  

## 2020-05-19 ENCOUNTER — Other Ambulatory Visit: Payer: Self-pay

## 2020-05-19 DIAGNOSIS — E785 Hyperlipidemia, unspecified: Secondary | ICD-10-CM

## 2020-05-19 MED ORDER — EVOLOCUMAB 140 MG/ML ~~LOC~~ SOAJ: 140.0000 mg | SUBCUTANEOUS | 3 refills | Status: DC

## 2020-05-26 DIAGNOSIS — E785 Hyperlipidemia, unspecified: Secondary | ICD-10-CM | POA: Diagnosis not present

## 2020-05-26 LAB — LIPID PANEL
Chol/HDL Ratio: 3.4 ratio (ref 0.0–5.0)
Cholesterol, Total: 155 mg/dL (ref 100–199)
HDL: 45 mg/dL (ref >39)
LDL Chol Calc (NIH): 79 mg/dL (ref 0–99)
Triglycerides: 184 mg/dL — ABNORMAL HIGH (ref 0–149)
VLDL Cholesterol Cal: 31 mg/dL (ref 5–40)

## 2020-06-16 ENCOUNTER — Other Ambulatory Visit: Payer: Self-pay

## 2020-06-16 ENCOUNTER — Ambulatory Visit (INDEPENDENT_AMBULATORY_CARE_PROVIDER_SITE_OTHER): Payer: Medicare Other

## 2020-06-16 DIAGNOSIS — Z7901 Long term (current) use of anticoagulants: Secondary | ICD-10-CM

## 2020-06-16 DIAGNOSIS — I480 Paroxysmal atrial fibrillation: Secondary | ICD-10-CM

## 2020-06-16 LAB — POCT INR: INR: 2.8 (ref 2.0–3.0)

## 2020-06-16 NOTE — Patient Instructions (Signed)
Continue taking warfarin 5mg  daily and repeat INR in 6 weeks.  CHADS-VASc = 4

## 2020-07-15 ENCOUNTER — Other Ambulatory Visit: Payer: Self-pay | Admitting: Cardiovascular Disease

## 2020-07-28 ENCOUNTER — Ambulatory Visit (INDEPENDENT_AMBULATORY_CARE_PROVIDER_SITE_OTHER): Payer: Medicare Other

## 2020-07-28 ENCOUNTER — Other Ambulatory Visit: Payer: Self-pay

## 2020-07-28 DIAGNOSIS — Z7901 Long term (current) use of anticoagulants: Secondary | ICD-10-CM

## 2020-07-28 DIAGNOSIS — I480 Paroxysmal atrial fibrillation: Secondary | ICD-10-CM | POA: Diagnosis not present

## 2020-07-28 DIAGNOSIS — Z125 Encounter for screening for malignant neoplasm of prostate: Secondary | ICD-10-CM | POA: Diagnosis not present

## 2020-07-28 DIAGNOSIS — E785 Hyperlipidemia, unspecified: Secondary | ICD-10-CM | POA: Diagnosis not present

## 2020-07-28 DIAGNOSIS — E039 Hypothyroidism, unspecified: Secondary | ICD-10-CM | POA: Diagnosis not present

## 2020-07-28 DIAGNOSIS — E291 Testicular hypofunction: Secondary | ICD-10-CM | POA: Diagnosis not present

## 2020-07-28 DIAGNOSIS — R7301 Impaired fasting glucose: Secondary | ICD-10-CM | POA: Diagnosis not present

## 2020-07-28 LAB — POCT INR: INR: 4 — AB (ref 2.0–3.0)

## 2020-07-28 NOTE — Patient Instructions (Signed)
Hold tomorrow only and then Continue taking warfarin '5mg'$  daily and repeat INR in 3 weeks.  CHADS-VASc = 4

## 2020-08-04 DIAGNOSIS — Z0000 Encounter for general adult medical examination without abnormal findings: Secondary | ICD-10-CM | POA: Diagnosis not present

## 2020-08-04 DIAGNOSIS — E785 Hyperlipidemia, unspecified: Secondary | ICD-10-CM | POA: Diagnosis not present

## 2020-08-04 DIAGNOSIS — I5022 Chronic systolic (congestive) heart failure: Secondary | ICD-10-CM | POA: Diagnosis not present

## 2020-08-04 DIAGNOSIS — Z7952 Long term (current) use of systemic steroids: Secondary | ICD-10-CM | POA: Diagnosis not present

## 2020-08-04 DIAGNOSIS — I10 Essential (primary) hypertension: Secondary | ICD-10-CM | POA: Diagnosis not present

## 2020-08-04 DIAGNOSIS — E291 Testicular hypofunction: Secondary | ICD-10-CM | POA: Diagnosis not present

## 2020-08-04 DIAGNOSIS — I25729 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: Secondary | ICD-10-CM | POA: Diagnosis not present

## 2020-08-04 DIAGNOSIS — D6869 Other thrombophilia: Secondary | ICD-10-CM | POA: Diagnosis not present

## 2020-08-04 DIAGNOSIS — Z1331 Encounter for screening for depression: Secondary | ICD-10-CM | POA: Diagnosis not present

## 2020-08-04 DIAGNOSIS — I4891 Unspecified atrial fibrillation: Secondary | ICD-10-CM | POA: Diagnosis not present

## 2020-08-04 DIAGNOSIS — E039 Hypothyroidism, unspecified: Secondary | ICD-10-CM | POA: Diagnosis not present

## 2020-08-04 DIAGNOSIS — D649 Anemia, unspecified: Secondary | ICD-10-CM | POA: Diagnosis not present

## 2020-08-04 DIAGNOSIS — Z1389 Encounter for screening for other disorder: Secondary | ICD-10-CM | POA: Diagnosis not present

## 2020-08-04 DIAGNOSIS — R82998 Other abnormal findings in urine: Secondary | ICD-10-CM | POA: Diagnosis not present

## 2020-08-04 DIAGNOSIS — M25551 Pain in right hip: Secondary | ICD-10-CM | POA: Diagnosis not present

## 2020-08-04 DIAGNOSIS — E1169 Type 2 diabetes mellitus with other specified complication: Secondary | ICD-10-CM | POA: Diagnosis not present

## 2020-08-06 ENCOUNTER — Other Ambulatory Visit: Payer: Self-pay | Admitting: Cardiovascular Disease

## 2020-08-07 NOTE — Telephone Encounter (Signed)
Prescription refill request received for warfarin Lov: arida 10/23/19 Next INR check: 8/15 Warfarin tablet strength:'5mg'$ 

## 2020-08-18 ENCOUNTER — Ambulatory Visit (INDEPENDENT_AMBULATORY_CARE_PROVIDER_SITE_OTHER): Payer: Medicare Other

## 2020-08-18 ENCOUNTER — Other Ambulatory Visit: Payer: Self-pay

## 2020-08-18 DIAGNOSIS — I480 Paroxysmal atrial fibrillation: Secondary | ICD-10-CM

## 2020-08-18 DIAGNOSIS — Z7901 Long term (current) use of anticoagulants: Secondary | ICD-10-CM | POA: Diagnosis not present

## 2020-08-18 LAB — POCT INR: INR: 2.8 (ref 2.0–3.0)

## 2020-08-18 NOTE — Patient Instructions (Signed)
Continue taking warfarin '5mg'$  daily and repeat INR in 5 weeks.  CHADS-VASc = 4

## 2020-09-02 ENCOUNTER — Other Ambulatory Visit: Payer: Self-pay

## 2020-09-02 ENCOUNTER — Encounter: Payer: Self-pay | Admitting: Physical Therapy

## 2020-09-02 ENCOUNTER — Ambulatory Visit: Payer: Medicare Other | Attending: Internal Medicine | Admitting: Physical Therapy

## 2020-09-02 DIAGNOSIS — R262 Difficulty in walking, not elsewhere classified: Secondary | ICD-10-CM | POA: Insufficient documentation

## 2020-09-02 DIAGNOSIS — M25651 Stiffness of right hip, not elsewhere classified: Secondary | ICD-10-CM | POA: Diagnosis not present

## 2020-09-02 NOTE — Patient Instructions (Signed)
Access Code: [email or web address] URL: [email or web address] Date: 09/02/2020 Prepared by: Lum Babe  Exercises Seated Hamstring Stretch with Chair - 1 x daily - 7 x weekly - 1 sets - 10 reps - 30 hold Supine Piriformis Stretch Pulling Heel to Hip - 1 x daily - 7 x weekly - 1 sets - 10 reps - 30 hold

## 2020-09-02 NOTE — Therapy (Signed)
[facility]. [place], [place], [address] Phone: [phone]   Fax:  [phone]  Physical Therapy Evaluation  Patient Details  Name: Gary [name] MRN: [id number] Date of Birth: 1945-03-03 Referring Provider (PT): Francesco Sor   Encounter Date: 09/02/2020   PT End of Session - 09/02/20 1005     Visit Number 1    Date for PT Re-Evaluation 12/03/20    Authorization Type [facility]    PT Start Time 0928    PT Stop Time 1008    PT Time Calculation (min) 40 min    Activity Tolerance Patient tolerated treatment well    Behavior During Therapy [facility] for tasks assessed/performed             Past Medical History:  Diagnosis Date   Aortic aneurysm ([place]) 02/20/2016   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   Arthritis    Atrial fibrillation ([place]) 11/13/2014   Cancer ([place])    skin   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/20/2016   LVEF 45-50% 11/2014   Coronary artery disease    Depression    Essential hypertension 11/13/2014   Hyperlipidemia    Hyperlipidemia 11/13/2014   Hypertension    Sleep apnea    CPAP 'Uses half the time"  last study 2009   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    Past Surgical History:  Procedure Laterality Date   CARDIOVERSION N/A 12/06/2014   Procedure: CARDIOVERSION;  Surgeon: [name], MD;  Location: [facility];  Service: Cardiovascular;  Laterality: N/A;   CARDIOVERSION N/A 05/26/2017   Procedure: CARDIOVERSION;  Surgeon: [name], MD;  Location: [facility];  Service: Cardiovascular;  Laterality: N/A;   CARDIOVERSION N/A 04/25/2019   Procedure: CARDIOVERSION;  Surgeon: Sanda Klein, MD;  Location: [facility];  Service: Cardiovascular;  Laterality: N/A;   CARPAL TUNNEL RELEASE     CORONARY ANGIOPLASTY     multiple stents   CORONARY ARTERY BYPASS GRAFT  2006   EYE SURGERY     right eye-  muscular repair   TOTAL HIP ARTHROPLASTY  01/03/2012    Procedure: TOTAL HIP ARTHROPLASTY;  Surgeon: [name], MD;  [facility];  Service: Orthopedics;  Laterality: Right;    There were no vitals filed for this visit.    Subjective Assessment - 09/02/20 0932     Subjective Patient reports a right THA in 2013, reports that he feels like he really did not do much after and that over the years he has had more tightness and tiredness in the legs, some difficulty walking.    Pertinent History AAA, HTN, cardioversion, afib    How long can you walk comfortably? difficulty at times walking to the mailbox    Patient Stated Goals better ROM, less fatigue    Currently in Pain? No/denies                [facility] PT Assessment - 09/02/20 0001       Assessment   Medical Diagnosis right hip tightness and difficulty walking    Referring Provider (PT) Francesco Sor    Onset Date/Surgical Date 08/26/20    Prior Therapy no      Precautions   Precautions None      Balance Screen   Has the patient fallen in the past 6 months No    Has the patient had a decrease in activity level because of a fear of falling?  No    Is the patient  reluctant to leave their home because of a fear of falling?  No      Home Environment   Additional Comments stairs with a handrail, does yardwork and gardening      Prior Function   Level of Independence Independent    Vocation Retired    Leisure gardening      ROM / Strength   AROM / PROM / Strength AROM;Strength      AROM   AROM Assessment Site Hip    Right/Left Hip Right    Right Hip Extension 10    Right Hip Flexion 80    Right Hip External Rotation  10      Strength   Strength Assessment Site Hip    Right/Left Hip Right    Right Hip Flexion 4/5    Right Hip Extension 4/5    Right Hip External Rotation  4-/5   in the ROM   Right Hip Internal Rotation 4-/5   in the ROM   Right Hip ABduction 3+/5      Flexibility   Soft Tissue Assessment /Muscle Length yes    Hamstrings very tight 40 degree SLR     Quadriceps very tight    ITB very tight    Piriformis very, very tight, adductor tightness      Palpation   Palpation comment some loss of mm in the right posterior and lateral hip area      Ambulation/Gait   Gait Comments slight limp onthe right, maybe a slight trendlenberg                        Objective measurements completed on examination: See above findings.                 PT Short Term Goals - 09/02/20 1009       PT SHORT TERM GOAL #1   Title independent with initial HEP    Time 2    Period Weeks    Status New               PT Long Term Goals - 09/02/20 1009       PT LONG TERM GOAL #1   Title independent with an advanced HEP    Time 12    Period Weeks    Status New      PT LONG TERM GOAL #2   Title be able to cross the right leg over his left while sitting and put on sock and shoe    Time 12    Period Weeks    Status New      PT LONG TERM GOAL #3   Title report no difficulty walking to the mailbox and back    Time 12    Period Weeks    Status New      PT LONG TERM GOAL #4   Title increase strength of the right hip to 4+/5    Time 12    Period Weeks    Status New                    Plan - 09/02/20 1006     Clinical Impression Statement Patient with c/o tightness in the right hip and leg, difficulty walking at times due to tightness and fatigue.  He reports that at times he struggles to make it to the mailbox, but at times he can push mow the yard.  He has a mild antalgic  gait on the right, he is very tight in the right hip mms, he has some weakness in the right hip.  He cannot cross the right leg over to put on shoes due to the tightness of the piriformis.  He does c/o some tightness in the lower leg and the knee with this    Stability/Clinical Decision Making Stable/Uncomplicated    Clinical Decision Making Low    Rehab Potential Good    PT Frequency 2x / week    PT Duration 12 weeks    PT  Treatment/Interventions ADLs/Self Care Home Management;Gait training;Stair training;Functional mobility training;Therapeutic activities;Therapeutic exercise;Balance training;Neuromuscular re-education;Manual techniques;Patient/family education    PT Next Visit Plan start overall conditioning and flexibility    Consulted and Agree with Plan of Care Patient             Patient will benefit from skilled therapeutic intervention in order to improve the following deficits and impairments:  Abnormal gait, Decreased range of motion, Difficulty walking, Decreased activity tolerance, Impaired flexibility, Decreased strength, Decreased mobility  Visit Diagnosis: Difficulty in walking, not elsewhere classified - Plan: PT plan of care cert/re-cert  Stiffness of right hip, not elsewhere classified - Plan: PT plan of care cert/re-cert     Problem List Patient Active Problem List   Diagnosis Date Noted   Long term (current) use of anticoagulants 02/12/2019   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 [place], [place], [address] Phone: [phone]   Fax:  [phone]  Name: Gary [name] MRN: [id number] Date of Birth: 07/27/1945

## 2020-09-04 ENCOUNTER — Encounter: Payer: Self-pay | Admitting: Physical Therapy

## 2020-09-04 ENCOUNTER — Ambulatory Visit: Payer: Medicare Other | Attending: Internal Medicine | Admitting: Physical Therapy

## 2020-09-04 ENCOUNTER — Other Ambulatory Visit: Payer: Self-pay

## 2020-09-04 DIAGNOSIS — R262 Difficulty in walking, not elsewhere classified: Secondary | ICD-10-CM | POA: Diagnosis not present

## 2020-09-04 DIAGNOSIS — M25651 Stiffness of right hip, not elsewhere classified: Secondary | ICD-10-CM | POA: Diagnosis not present

## 2020-09-04 DIAGNOSIS — Z85820 Personal history of malignant melanoma of skin: Secondary | ICD-10-CM | POA: Diagnosis not present

## 2020-09-04 DIAGNOSIS — Z85828 Personal history of other malignant neoplasm of skin: Secondary | ICD-10-CM | POA: Diagnosis not present

## 2020-09-04 DIAGNOSIS — L570 Actinic keratosis: Secondary | ICD-10-CM | POA: Diagnosis not present

## 2020-09-04 DIAGNOSIS — D1801 Hemangioma of skin and subcutaneous tissue: Secondary | ICD-10-CM | POA: Diagnosis not present

## 2020-09-04 NOTE — Therapy (Signed)
[facility]. [place], [place], [address] Phone: [phone]   Fax:  [phone]  Physical Therapy Treatment  Patient Details  Name: Gary [name] MRN: [id number] Date of Birth: 1945/08/22 Referring Provider (PT): Francesco Sor   Encounter Date: 09/04/2020   PT End of Session - 09/04/20 1424     Visit Number 2    Date for PT Re-Evaluation 12/03/20    Authorization Type [facility]    PT Start Time 9323    PT Stop Time 1426    PT Time Calculation (min) 30 min    Activity Tolerance Patient tolerated treatment well    Behavior During Therapy [facility] for tasks assessed/performed             Past Medical History:  Diagnosis Date   Aortic aneurysm ([place]) 02/20/2016   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   Arthritis    Atrial fibrillation ([place]) 11/13/2014   Cancer ([place])    skin   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/20/2016   LVEF 45-50% 11/2014   Coronary artery disease    Depression    Essential hypertension 11/13/2014   Hyperlipidemia    Hyperlipidemia 11/13/2014   Hypertension    Sleep apnea    CPAP 'Uses half the time"  last study 2009   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    Past Surgical History:  Procedure Laterality Date   CARDIOVERSION N/A 12/06/2014   Procedure: CARDIOVERSION;  Surgeon: [name], MD;  Location: [facility];  Service: Cardiovascular;  Laterality: N/A;   CARDIOVERSION N/A 05/26/2017   Procedure: CARDIOVERSION;  Surgeon: [name], MD;  Location: [facility];  Service: Cardiovascular;  Laterality: N/A;   CARDIOVERSION N/A 04/25/2019   Procedure: CARDIOVERSION;  Surgeon: Sanda Klein, MD;  Location: [facility];  Service: Cardiovascular;  Laterality: N/A;   CARPAL TUNNEL RELEASE     CORONARY ANGIOPLASTY     multiple stents   CORONARY ARTERY BYPASS GRAFT  2006   EYE SURGERY     right eye-  muscular repair   TOTAL HIP ARTHROPLASTY  01/03/2012    Procedure: TOTAL HIP ARTHROPLASTY;  Surgeon: [name], MD;  [facility];  Service: Orthopedics;  Laterality: Right;    There were no vitals filed for this visit.   Subjective Assessment - 09/04/20 1359     Subjective "Im feeling good"    Currently in Pain? No/denies                               [facility] Adult PT Treatment/Exercise - 09/04/20 0001       Exercises   Exercises Knee/Hip      Knee/Hip Exercises: Aerobic   Stationary Bike L3 x x 5 min      Knee/Hip Exercises: Machines for Strengthening   Cybex Knee Flexion 35lb 2x10      Knee/Hip Exercises: Standing   Lateral Step Up Both;1 set;10 reps;Step Height: 6";Hand Hold: 0    Walking with Sports Cord 30lb side step x5 each      Knee/Hip Exercises: Seated   Ball Squeeze x10    Sit to Sand 2 sets;10 reps;without UE support                      PT Short Term Goals - 09/04/20 1427       PT SHORT TERM GOAL #1   Title independent with initial  HEP    Status Partially Met               PT Long Term Goals - 09/02/20 1009       PT LONG TERM GOAL #1   Title independent with an advanced HEP    Time 12    Period Weeks    Status New      PT LONG TERM GOAL #2   Title be able to cross the right leg over his left while sitting and put on sock and shoe    Time 12    Period Weeks    Status New      PT LONG TERM GOAL #3   Title report no difficulty walking to the mailbox and back    Time 12    Period Weeks    Status New      PT LONG TERM GOAL #4   Title increase strength of the right hip to 4+/5    Time 12    Period Weeks    Status New                   Plan - 09/04/20 1424     Clinical Impression Statement Pt ~ 11 minutes late for today session. Mild antalgic gait remains. Some instability noted with resisted side steps. Cue needed to control the eccentric phase of leg extensions and leg curls.    Rehab Potential Good    PT Frequency 2x / week    PT  Duration 12 weeks    PT Treatment/Interventions ADLs/Self Care Home Management;Gait training;Stair training;Functional mobility training;Therapeutic activities;Therapeutic exercise;Balance training;Neuromuscular re-education;Manual techniques;Patient/family education    PT Next Visit Plan overall conditioning and flexibility             Patient will benefit from skilled therapeutic intervention in order to improve the following deficits and impairments:  Abnormal gait, Decreased range of motion, Difficulty walking, Decreased activity tolerance, Impaired flexibility, Decreased strength, Decreased mobility  Visit Diagnosis: Stiffness of right hip, not elsewhere classified  Difficulty in walking, not elsewhere classified     Problem List Patient Active Problem List   Diagnosis Date Noted   Long term (current) use of anticoagulants 02/12/2019   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 [place], [place], [address] Phone: [phone]   Fax:  [phone]  Name: Gary [name] MRN: [id number] Date of Birth: 1945/08/13

## 2020-09-09 ENCOUNTER — Encounter: Payer: Self-pay | Admitting: Physical Therapy

## 2020-09-09 ENCOUNTER — Other Ambulatory Visit: Payer: Self-pay

## 2020-09-09 ENCOUNTER — Ambulatory Visit: Payer: Medicare Other | Admitting: Physical Therapy

## 2020-09-09 DIAGNOSIS — M3130 Wegener's granulomatosis without renal involvement: Secondary | ICD-10-CM | POA: Diagnosis not present

## 2020-09-09 DIAGNOSIS — I10 Essential (primary) hypertension: Secondary | ICD-10-CM | POA: Diagnosis not present

## 2020-09-09 DIAGNOSIS — E039 Hypothyroidism, unspecified: Secondary | ICD-10-CM | POA: Diagnosis not present

## 2020-09-09 DIAGNOSIS — Z7952 Long term (current) use of systemic steroids: Secondary | ICD-10-CM | POA: Diagnosis not present

## 2020-09-09 DIAGNOSIS — M25651 Stiffness of right hip, not elsewhere classified: Secondary | ICD-10-CM

## 2020-09-09 DIAGNOSIS — R262 Difficulty in walking, not elsewhere classified: Secondary | ICD-10-CM | POA: Diagnosis not present

## 2020-09-09 DIAGNOSIS — M25551 Pain in right hip: Secondary | ICD-10-CM | POA: Diagnosis not present

## 2020-09-09 DIAGNOSIS — E1169 Type 2 diabetes mellitus with other specified complication: Secondary | ICD-10-CM | POA: Diagnosis not present

## 2020-09-09 DIAGNOSIS — I5022 Chronic systolic (congestive) heart failure: Secondary | ICD-10-CM | POA: Diagnosis not present

## 2020-09-09 DIAGNOSIS — I4891 Unspecified atrial fibrillation: Secondary | ICD-10-CM | POA: Diagnosis not present

## 2020-09-09 DIAGNOSIS — Z23 Encounter for immunization: Secondary | ICD-10-CM | POA: Diagnosis not present

## 2020-09-09 DIAGNOSIS — D6869 Other thrombophilia: Secondary | ICD-10-CM | POA: Diagnosis not present

## 2020-09-09 DIAGNOSIS — E785 Hyperlipidemia, unspecified: Secondary | ICD-10-CM | POA: Diagnosis not present

## 2020-09-09 DIAGNOSIS — I719 Aortic aneurysm of unspecified site, without rupture: Secondary | ICD-10-CM | POA: Diagnosis not present

## 2020-09-09 NOTE — Therapy (Signed)
[place] [place], [place], [address] Phone: [phone]   Fax:  [phone]  Physical Therapy Treatment  Patient Details  Name: Gary [name] MRN: [id number] Date of Birth: 1945/02/27 Referring Provider (PT): Francesco Sor   Encounter Date: 09/09/2020   PT End of Session - 09/09/20 [id number]     Visit Number 3    Date for PT Re-Evaluation 12/03/20    Authorization Type [employer]    PT Start Time 1600    PT Stop Time [id number]    PT Time Calculation (min) 41 min    Activity Tolerance Patient tolerated treatment well    Behavior During Therapy [facility] for tasks assessed/performed             Past Medical History:  Diagnosis Date   Aortic aneurysm ([place]) 02/20/2016   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   Arthritis    Atrial fibrillation ([place]) 11/13/2014   Cancer ([place])    skin   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/2016   LVEF 45-50% 11/2014   Coronary artery disease    Depression    Essential hypertension 11/13/2014   Hyperlipidemia    Hyperlipidemia 11/13/2014   Hypertension    Sleep apnea    CPAP 'Uses half the time"  last study 2009   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    Past Surgical History:  Procedure Laterality Date   CARDIOVERSION N/A 12/06/2014   Procedure: CARDIOVERSION;  Surgeon: [name], MD;  Location: [place];  Service: Cardiovascular;  Laterality: N/A;   CARDIOVERSION N/A 05/26/2017   Procedure: CARDIOVERSION;  Surgeon: [name], MD;  Location: [place];  Service: Cardiovascular;  Laterality: N/A;   CARDIOVERSION N/A 04/25/2019   Procedure: CARDIOVERSION;  Surgeon: Sanda Klein, MD;  Location: [place];  Service: Cardiovascular;  Laterality: N/A;   CARPAL TUNNEL RELEASE     CORONARY ANGIOPLASTY     multiple stents   CORONARY ARTERY BYPASS GRAFT  2006   EYE SURGERY     right eye-  muscular repair   TOTAL HIP ARTHROPLASTY  01/03/2012    Procedure: TOTAL HIP ARTHROPLASTY;  Surgeon: [name], MD;  Location: [facility];  Service: Orthopedics;  Laterality: Right;    There were no vitals filed for this visit.   Subjective Assessment - 09/09/20 1557     Subjective "Going good" knees are sore a little bit    Currently in Pain? No/denies                               [facility] Adult PT Treatment/Exercise - 09/09/20 0001       Knee/Hip Exercises: Stretches   Active Hamstring Stretch Right;4 reps;20 seconds;10 seconds    Piriformis Stretch Right;3 reps;10 seconds;20 seconds      Knee/Hip Exercises: Aerobic   Stationary Bike L2.6 x4 min    Nustep L5 x 5 min      Knee/Hip Exercises: Machines for Strengthening   Cybex Knee Extension 10lb 2x10    Cybex Knee Flexion 35lb 2x10    Cybex Leg Press 40lb 2x10      Knee/Hip Exercises: Standing   Heel Raises Both;2 sets;10 reps    Lateral Step Up Both;1 set;10 reps;Hand Hold: 0;Step Height: 6"    Forward Step Up Both;1 set;10 reps;Hand Hold: 0;Step Height: 6"      Knee/Hip Exercises: Seated   Sit to Sand 2 sets;10 reps;without UE  support   holding blue ball                     PT Short Term Goals - 09/09/20 1641       PT SHORT TERM GOAL #1   Title independent with initial HEP    Status Achieved               PT Long Term Goals - 09/09/20 1641       PT LONG TERM GOAL #1   Title independent with an advanced HEP    Status On-going      PT LONG TERM GOAL #2   Title be able to cross the right leg over his left while sitting and put on sock and shoe    Status On-going                   Plan - 09/09/20 1642     Clinical Impression Statement Pt tolerated today's interventions well evident bu no subjective reports of increase pain. Good strength and ROm noted with all exercise interventions. Some instability with lateral step ups to his. significant tightness noted in his Le with stretching.    Stability/Clinical Decision  Making Stable/Uncomplicated    Rehab Potential Good    PT Frequency 2x / week    PT Duration 12 weeks    PT Treatment/Interventions ADLs/Self Care Home Management;Gait training;Stair training;Functional mobility training;Therapeutic activities;Therapeutic exercise;Balance training;Neuromuscular re-education;Manual techniques;Patient/family education    PT Next Visit Plan overall conditioning and flexibility             Patient will benefit from skilled therapeutic intervention in order to improve the following deficits and impairments:  Abnormal gait, Decreased range of motion, Difficulty walking, Decreased activity tolerance, Impaired flexibility, Decreased strength, Decreased mobility  Visit Diagnosis: Difficulty in walking, not elsewhere classified  Stiffness of right hip, not elsewhere classified     Problem List Patient Active Problem List   Diagnosis Date Noted   Long term (current) use of anticoagulants 02/12/2019   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   Acute respiratory failure ([place]) 05/03/2017   Pulmonary edema cardiac cause ([place]) 05/03/2017   Respiratory failure ([place]) 05/03/2017   Chronic combined systolic and diastolic heart failure ([place]) 02/20/2016   Aortic aneurysm ([place]) 02/20/2016   CAD (coronary artery disease) 11/13/2014   Essential hypertension 11/13/2014   Hyperlipidemia 11/13/2014   Atrial fibrillation ([place]) 11/13/2014   Osteoarthritis of right hip 01/03/2012    Scot Jun 09/09/2020, 4:44 PM  [place]. [place], [place], [address] Phone: [phone]   Fax:  [phone]  Name: Gary [name] MRN: [id number] Date of Birth: 1945-12-11

## 2020-09-10 ENCOUNTER — Ambulatory Visit (HOSPITAL_BASED_OUTPATIENT_CLINIC_OR_DEPARTMENT_OTHER): Payer: Medicare Other | Admitting: Cardiovascular Disease

## 2020-09-10 DIAGNOSIS — M3130 Wegener's granulomatosis without renal involvement: Secondary | ICD-10-CM | POA: Diagnosis not present

## 2020-09-10 DIAGNOSIS — R5383 Other fatigue: Secondary | ICD-10-CM | POA: Diagnosis not present

## 2020-09-10 DIAGNOSIS — M6281 Muscle weakness (generalized): Secondary | ICD-10-CM | POA: Diagnosis not present

## 2020-09-10 DIAGNOSIS — Z7952 Long term (current) use of systemic steroids: Secondary | ICD-10-CM | POA: Diagnosis not present

## 2020-09-10 DIAGNOSIS — E669 Obesity, unspecified: Secondary | ICD-10-CM | POA: Diagnosis not present

## 2020-09-10 DIAGNOSIS — Z6834 Body mass index (BMI) 34.0-34.9, adult: Secondary | ICD-10-CM | POA: Diagnosis not present

## 2020-09-11 ENCOUNTER — Other Ambulatory Visit: Payer: Self-pay

## 2020-09-11 ENCOUNTER — Ambulatory Visit: Payer: Medicare Other | Admitting: Physical Therapy

## 2020-09-11 ENCOUNTER — Encounter: Payer: Self-pay | Admitting: Physical Therapy

## 2020-09-11 DIAGNOSIS — R262 Difficulty in walking, not elsewhere classified: Secondary | ICD-10-CM | POA: Diagnosis not present

## 2020-09-11 DIAGNOSIS — M25651 Stiffness of right hip, not elsewhere classified: Secondary | ICD-10-CM | POA: Diagnosis not present

## 2020-09-11 NOTE — Therapy (Signed)
[place]. [place], [place], [address] Phone: [phone]   Fax:  [phone]  Physical Therapy Treatment  Patient Details  Name: Gary [name] MRN: [id number] Date of Birth: [birth year] Referring Provider (PT): Francesco Sor   Encounter Date: 09/11/2020   PT End of Session - 09/11/20 1525     Visit Number 4    Date for PT Re-Evaluation 12/03/20    Authorization Type [employer]    PT Start Time 1442    PT Stop Time 1525    PT Time Calculation (min) 43 min    Activity Tolerance Patient tolerated treatment well    Behavior During Therapy [facility] for tasks assessed/performed             Past Medical History:  Diagnosis Date   Aortic aneurysm ([place]) 02/20/2016   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   Arthritis    Atrial fibrillation ([place]) 11/13/2014   Cancer ([place])    skin   Chronic combined systolic and diastolic heart failure ([place]) 02/20/2016   LVEF 45-50% 11/2014   Coronary artery disease    Depression    Essential hypertension 11/13/2014   Hyperlipidemia    Hyperlipidemia 11/13/2014   Hypertension    Sleep apnea    CPAP 'Uses half the time"  last study 2009   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    Past Surgical History:  Procedure Laterality Date   CARDIOVERSION N/A 12/06/2014   Procedure: CARDIOVERSION;  Surgeon: [name], MD;  Location: [place];  Service: Cardiovascular;  Laterality: N/A;   CARDIOVERSION N/A 05/26/2017   Procedure: CARDIOVERSION;  Surgeon: [name], MD;  Location: [place];  Service: Cardiovascular;  Laterality: N/A;   CARDIOVERSION N/A 04/25/2019   Procedure: CARDIOVERSION;  Surgeon: Sanda Klein, MD;  Location: [place];  Service: Cardiovascular;  Laterality: N/A;   CARPAL TUNNEL RELEASE     CORONARY ANGIOPLASTY     multiple stents   CORONARY ARTERY BYPASS GRAFT  2006   EYE SURGERY     right eye-  muscular repair   TOTAL HIP ARTHROPLASTY  01/03/2012    Procedure: TOTAL HIP ARTHROPLASTY;  Surgeon: [name], MD;  [facility];  Service: Orthopedics;  Laterality: Right;    There were no vitals filed for this visit.   Subjective Assessment - 09/11/20 1444     Subjective Feeling a little better, knees were pretty sore    Currently in Pain? No/denies                               [facility] Adult PT Treatment/Exercise - 09/11/20 0001       Knee/Hip Exercises: Stretches   Active Hamstring Stretch Right;4 reps;20 seconds;10 seconds    Passive Hamstring Stretch Right;4 reps;20 seconds    Quad Stretch Right;4 reps;20 seconds    Piriformis Stretch Right;4 reps;20 seconds    Gastroc Stretch Both;4 reps;20 seconds      Knee/Hip Exercises: Aerobic   Stationary Bike level 3.4 x 6 minutes    Nustep L5 x 5 min      Knee/Hip Exercises: Machines for Strengthening   Cybex Leg Press 20# right only 4x5, then 20# both legs deeper bend for the ROM, then no weight for the ROM      Knee/Hip Exercises: Standing   Hip Flexion Right;2 sets;10 reps    Hip Flexion Limitations 3#    Hip Abduction Right;2 sets;10 reps  Abduction Limitations 3#    Hip Extension Right;2 sets;10 reps    Extension Limitations 3#    Walking with Sports Cord all directions                       PT Short Term Goals - 09/09/20 1641       PT SHORT TERM GOAL #1   Title independent with initial HEP    Status Achieved               PT Long Term Goals - 09/09/20 1641       PT LONG TERM GOAL #1   Title independent with an advanced HEP    Status On-going      PT LONG TERM GOAL #2   Title be able to cross the right leg over his left while sitting and put on sock and shoe    Status On-going                   Plan - 09/11/20 1526     Clinical Impression Statement Pateitn very tight in the right hip and LE, tight calf, hip flexor, quad, HS and piriformis mms. Does the exercises without c/o pain just needs cues for  form and posture and cues to slow down, tends to go very fast.  He did need a rest after 3 minutes on the bike    PT Next Visit Plan overall conditioning and flexibility    Consulted and Agree with Plan of Care Patient             Patient will benefit from skilled therapeutic intervention in order to improve the following deficits and impairments:  Abnormal gait, Decreased range of motion, Difficulty walking, Decreased activity tolerance, Impaired flexibility, Decreased strength, Decreased mobility  Visit Diagnosis: Difficulty in walking, not elsewhere classified     Problem List Patient Active Problem List   Diagnosis Date Noted   Long term (current) use of anticoagulants 02/12/2019   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   Acute respiratory failure ([place]) 05/03/2017   Pulmonary edema cardiac cause ([place]) 05/03/2017   Respiratory failure ([place]) 05/03/2017   Chronic combined systolic and diastolic heart failure ([place]) 02/20/2016   Aortic aneurysm ([place]) 02/20/2016   CAD (coronary artery disease) 11/13/2014   Essential hypertension 11/13/2014   Hyperlipidemia 11/13/2014   Atrial fibrillation ([place]) 11/13/2014   Osteoarthritis of right hip 01/03/2012    Sumner Boast, PT 09/11/2020, 3:28 PM  [place]. [place], [place], [address] Phone: [phone]   Fax:  [phone]  Name: Gary [name] MRN: [id number] Date of Birth: April 14, 1945

## 2020-09-12 ENCOUNTER — Encounter (HOSPITAL_BASED_OUTPATIENT_CLINIC_OR_DEPARTMENT_OTHER): Payer: Self-pay | Admitting: Cardiovascular Disease

## 2020-09-12 ENCOUNTER — Ambulatory Visit (INDEPENDENT_AMBULATORY_CARE_PROVIDER_SITE_OTHER): Payer: Medicare Other | Admitting: Cardiovascular Disease

## 2020-09-12 VITALS — BP 114/70 | HR 60 | Ht 70.0 in | Wt 245.2 lb

## 2020-09-12 DIAGNOSIS — I6523 Occlusion and stenosis of bilateral carotid arteries: Secondary | ICD-10-CM | POA: Diagnosis not present

## 2020-09-12 DIAGNOSIS — I712 Thoracic aortic aneurysm, without rupture, unspecified: Secondary | ICD-10-CM

## 2020-09-12 DIAGNOSIS — I2583 Coronary atherosclerosis due to lipid rich plaque: Secondary | ICD-10-CM

## 2020-09-12 DIAGNOSIS — M3130 Wegener's granulomatosis without renal involvement: Secondary | ICD-10-CM | POA: Diagnosis not present

## 2020-09-12 DIAGNOSIS — I2510 Atherosclerotic heart disease of native coronary artery without angina pectoris: Secondary | ICD-10-CM | POA: Diagnosis not present

## 2020-09-12 DIAGNOSIS — I5042 Chronic combined systolic (congestive) and diastolic (congestive) heart failure: Secondary | ICD-10-CM

## 2020-09-12 NOTE — Assessment & Plan Note (Signed)
Currently in sinus rhythm.  He is asymptomatic when in atrial fibrillation.  Continue carvedilol and warfarin.  INRs have been stable.

## 2020-09-12 NOTE — Assessment & Plan Note (Signed)
No angina in rehab.  Continue warfarin, carvedilol, and Repatha.

## 2020-09-12 NOTE — Assessment & Plan Note (Signed)
Noted on prior echoes but not been his most recent 1.  We will repeat his echocardiogram to assess  Blood pressure is well-controlled and he is on a beta-blocker.

## 2020-09-12 NOTE — Assessment & Plan Note (Signed)
Symtoms have been progressing.  Rituximab increased by Rheumatology recently.

## 2020-09-12 NOTE — Assessment & Plan Note (Signed)
LVEF 45 to 50%.  He is euvolemic.  It has been several years since he got an echo.  We will repeat one now.  Continue carvedilol and losartan.  If LVEF is more reduced would consider adding an SGLT2 inhibitor.

## 2020-09-12 NOTE — Assessment & Plan Note (Signed)
Continue Repatha.

## 2020-09-12 NOTE — Patient Instructions (Addendum)
  Medication Instructions:  Continue current medications  *If you need a refill on your cardiac medications before your next appointment, please call your pharmacy*   Lab Work: None Ordered   Testing/Procedures: Your physician has requested that you have an echocardiogram. Echocardiography is a painless test that uses sound waves to create images of your heart. It provides your doctor with information about the size and shape of your heart and how well your heart's chambers and valves are working. This procedure takes approximately one hour. There are no restrictions for this procedure.  Your physician has requested that you have a carotid duplex. This test is an ultrasound of the carotid arteries in your neck. It looks at blood flow through these arteries that supply the brain with blood. Allow one hour for this exam. There are no restrictions or special instructions.  Follow-Up: At [facility], you and your health needs are our priority.  As part of our continuing mission to provide you with exceptional heart care, we have created designated Provider Care Teams.  These Care Teams include your primary Cardiologist (physician) and Advanced Practice Providers (APPs -  Physician Assistants and Nurse Practitioners) who all work together to provide you with the care you need, when you need it.  We recommend signing up for the patient portal called "MyChart".  Sign up information is provided on this After Visit Summary.  MyChart is used to connect with patients for Virtual Visits (Telemedicine).  Patients are able to view lab/test results, encounter notes, upcoming appointments, etc.  Non-urgent messages can be sent to your provider as well.   To learn more about what you can do with MyChart, go to [email or web address].    Your next appointment:   12 month(s)  The format for your next appointment:   In Person  Provider:   [name], MD

## 2020-09-12 NOTE — Progress Notes (Signed)
Cardiology Office Note   Date:  09/12/2020   ID:  JAMEAR KANIECKI, DOB 04-Mar-1945, MRN [id number]  PCP:  [name], DO  Cardiologist:   [name], MD   No chief complaint on file.   Patient ID: Gary [name] is a 75 y.o. male with hypertension,  Melanoma, CAD s/p PCI and CABG, mild ascending aortic aneurysm, chronic systolic and diastolic heart failure, hyperlipidemia, paroxysmal atrial fibrillation, and OSA non-compliant with CPAP who presents for follow up.  Gary [name] was first seen 11/2014 after being diagnosed with atrial fibrillation.  He felt tired and underwent successful DCCV on 12/2014.  He had an echo 11/2014 that revealed LVEF 45-50% with inferolateral hypokinesis.  He also was noted to have an ascending aorta aneurysm (4.0cm).  Repeat echo 03/2016 was unchanged. He then had a Lexiscan Myoview 11/27/14 that revealed a small, fixed inferolateral defect.  He was noted to have a carotid bruit so he was referred for carotid Doppler 02/2015 that revealed 1-39% ICA stenosis bilaterally.   He request to switch to warfarin due to cost. At his last appointment he reported lightheadedness. He was referred for ABI's that were normal 10/2019.   Today, patient is doing well. 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  He has experiencing problems with his right eye and sinuses. He takes Prednisone 5 mg daily for his eye. He was seen by [facility] and he increased his dosages of his medication. For exercise, he currently has been in rehab and has had no issues. He denies shortness of breath.  He has no LE edema, orthopnea or PND.  He thinks that the lower extremity stiffness he was having was coming from his hip.  It seems to be improving with PT.   Past Medical History:  Diagnosis Date   Aortic aneurysm ([place]) 02/20/2016   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   Arthritis    Atrial fibrillation ([place]) 11/13/2014   Cancer ([place])    skin    Chronic combined systolic and diastolic heart failure ([place]) 02/20/2016   LVEF 45-50% 11/2014   Coronary artery disease    Depression    Essential hypertension 11/13/2014   Hyperlipidemia    Hyperlipidemia 11/13/2014   Hypertension    Sleep apnea    CPAP 'Uses half the time"  last study 2009   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    Past Surgical History:  Procedure Laterality Date   CARDIOVERSION N/A 12/06/2014   Procedure: CARDIOVERSION;  Surgeon: [name], MD;  Location: [place];  Service: Cardiovascular;  Laterality: N/A;   CARDIOVERSION N/A 05/26/2017   Procedure: CARDIOVERSION;  Surgeon: [name], MD;  Location: [place];  Service: Cardiovascular;  Laterality: N/A;   CARDIOVERSION N/A 04/25/2019   Procedure: CARDIOVERSION;  Surgeon: Sanda Klein, MD;  Location: [place];  Service: Cardiovascular;  Laterality: N/A;   [place]     multiple stents   CORONARY ARTERY BYPASS GRAFT  2006   EYE SURGERY     right eye-  muscular repair   TOTAL HIP ARTHROPLASTY  01/03/2012   Procedure: TOTAL HIP ARTHROPLASTY;  Surgeon: [name], MD;  [facility];  Service: Orthopedics;  Laterality: Right;     Current Outpatient Medications  Medication Sig Dispense Refill   ALPRAZolam (XANAX) 1 MG tablet Take 1 mg by mouth at bedtime.   1   amLODipine (NORVASC) 2.5 MG tablet TAKE 1 TABLET BY  MOUTH EVERY DAY 90 tablet 3   calcium carbonate (OS-CAL) 600 MG TABS tablet Take 600 mg by mouth daily with breakfast.     carvedilol (COREG) 25 MG tablet TAKE 1 TABLET BY MOUTH TWICE A DAY 180 tablet 3   cetirizine (ZYRTEC) 10 MG tablet Take 10 mg by mouth daily.     Cholecalciferol (VITAMIN D3) 125 MCG (5000 UT) TABS Take 5,000 Units by mouth daily.     Coenzyme Q10 (CO Q 10) 100 MG CAPS Take 300 mg by mouth daily.      Evolocumab 140 MG/ML SOAJ Inject 140 mg into the skin every 14 (fourteen) days. Inject 1 pen into the fatty tissue  skin every 14 day 6 mL 3   hydrochlorothiazide (HYDRODIURIL) 12.5 MG tablet TAKE 1 TABLET BY MOUTH EVERY DAY 90 tablet 1   levothyroxine (SYNTHROID, LEVOTHROID) 137 MCG tablet Take 137-205.5 mcg by mouth See admin instructions. Take 205.5 mcg by mouth on Wednesday.  Take 137 mcg by mouth daily on all other days     losartan (COZAAR) 100 MG tablet TAKE 1 TABLET BY MOUTH EVERY DAY 90 tablet 1   metFORMIN (GLUCOPHAGE) 500 MG tablet Take 500 mg by mouth 2 (two) times daily.  12   Multiple Vitamin (MULTIVITAMIN WITH MINERALS) TABS tablet Take 1 tablet by mouth daily. Centrum silver     prednisoLONE acetate (PRED FORTE) 1 % ophthalmic suspension Place 1 drop into the right eye as needed (if feel like sand in eye).     predniSONE (DELTASONE) 5 MG tablet Take 5 mg by mouth as needed (for wagoners).     riTUXimab (RITUXAN IV) Inject 1 application into the vein every 6 (six) months.      sodium chloride (OCEAN) 0.65 % SOLN nasal spray Place 1 spray into both nostrils daily as needed for congestion.      sulfamethoxazole-trimethoprim (BACTRIM) 400-80 MG tablet Take 1 tablet by mouth daily.     testosterone cypionate (DEPOTESTOSTERONE CYPIONATE) 200 MG/ML injection Inject 100 mg into the muscle every 7 (seven) days.     venlafaxine XR (EFFEXOR-XR) 150 MG 24 hr capsule Take 150 mg by mouth daily.      warfarin (COUMADIN) 5 MG tablet TAKE 1 TABLET BY MOUTH EVERY DAY 90 tablet 0   No current facility-administered medications for this visit.    Allergies:   Statins    Social History:  The patient  reports that he quit smoking about 36 years ago. His smoking use included cigarettes. He has never used smokeless tobacco. He reports that he does not drink alcohol and does not use drugs.   Family History:  The patient's family history includes Heart attack in his maternal grandfather, mother, paternal grandfather, and paternal grandmother; Other in his father; Stroke in his maternal grandmother.    ROS:   Please see the history of present illness.  (+) sinuses (+) right eye  PHYSICAL EXAM: VS:  BP 114/70   Pulse 60   Ht '5\' 10"'$  (1.778 m)   Wt 245 lb 3.2 oz (111.2 kg)   BMI 35.18 kg/m  , BMI Body mass index is 35.18 kg/m. GENERAL:  Well appearing HEENT: Pupils equal round and reactive, fundi not visualized, oral mucosa unremarkable NECK:  No jugular venous distention, waveform within normal limits, carotid upstroke brisk and symmetric, no bruits LUNGS:  Clear to auscultation bilaterally HEART: Regular rate and rhythm.  PMI not displaced or sustained,S1 and S2 within normal limits, no S3, no S4,  no clicks, no rubs, no murmurs ABD:  Flat, positive bowel sounds normal in frequency in pitch, no bruits, no rebound, no guarding, no midline pulsatile mass, no hepatomegaly, no splenomegaly EXT:  2 plus pulses throughout, no edema, no cyanosis no clubbing SKIN:  No rashes no nodules NEURO:  Cranial nerves II through XII grossly intact, motor grossly intact throughout PSYCH:  Cognitively intact, oriented to person place and time   EKG:   02/20/16: Sinus rhythm. Rate 66 bpm. Left ventricular hypertrophy with repolarization abnormality. 08/30/16:  Sinus rhythm. Rate 66 bpm. LAFB. Poor R wave progression. 04/12/17: Sinus rhythm.  Rate 65 bpm.  Prior anteroseptal infarct.  Nonspecific ST changes.   11/23/18: Atrial fibrillation.  Rate 77 bpm.  Prior septal infarct.   09/11/19: Sinus rhythm.  Rate 56 bpm.  Prior septal infarct 9/22: Sinus rhythm. 60 bpm. LVH   ECHO 9/19:  Study Conclusions   - Left ventricle: The cavity size was normal. There was moderate    concentric hypertrophy. Systolic function was mildly to    moderately reduced. The estimated ejection fraction was in the    range of 40% to 45%. Diffuse hypokinesis.  - Aortic valve: Transvalvular velocity was within the normal range.    There was no stenosis. There was no regurgitation.  - Mitral valve: Calcified annulus. Transvalvular  velocity was    within the normal range. There was no evidence for stenosis.    There was no regurgitation.  - Right ventricle: The cavity size was normal. Wall thickness was    normal. Systolic function was mildly reduced.  - Atrial septum: No defect or patent foramen ovale was identified    by color flow Doppler.  - Tricuspid valve: There was no regurgitation.   ECHO 3/18:  - Left ventricle: The cavity size was normal. There was moderate    concentric hypertrophy. Systolic function was mildly reduced. The    estimated ejection fraction was in the range of 45% to 50%.    Diffuse hypokinesis. Doppler parameters are consistent with    abnormal left ventricular relaxation (grade 1 diastolic    dysfunction).  - Aortic valve: Trileaflet; mildly thickened, mildly calcified    leaflets.  - Aorta: Ascending aortic diameter: 41 mm (S).  - Ascending aorta: The ascending aorta was mildly dilated.  - Mitral valve: Calcified annulus. There was mild regurgitation.  - Left atrium: The atrium was moderately dilated.  - Pulmonary arteries: Systolic pressure was mildly increased. PA    peak pressure: 39 mm Hg (S).   Echo 11/20/14: Study Conclusions   - Left ventricle: The cavity size was normal. Wall thickness was   increased in a pattern of moderate LVH. Systolic function was   mildly reduced. The estimated ejection fraction was in the range   of 45% to 50%. Inferiorlateral hypokinesis. Abnormal GLPSS at   -11%, with inferolateral strain abnormality. The study is not   technically sufficient to allow evaluation of LV diastolic   function. - Aorta: Ascending aortic diameter: 40 mm (S). - Ascending aorta: The ascending aorta was mildly dilated. - Left atrium: The atrium was normal in size. - Right atrium: Severely dilated at 30 cm2. - Systemic veins: The IVC measures <2.1 cm, but does not collapse   >50%, suggesting an elevated RA pressure of 8 mmHg.  Lexiscan Myoview 11/27/14: There was no  ST segment deviation noted during stress. No T wave inversion was noted during stress. Defect 1: There is a small defect of  moderate severity.   Small size, moderate-intensity fixed inferolateral wall defect - could represent artifact or scar. The study was non-gated, therefore LVEF was not calculated and wall motion abnormalities could not be determined. Based on the lack of significant reversible ischemia, this is a low risk study.  Carotid Doppler 02/19/15: 1-39% ICA stenosis bilaterally  Recent Labs: 03/24/2020: ALT 16; BUN 25; Creatinine, Ser 0.90; Potassium 4.1; Sodium 144    Lipid Panel    Component Value Date/Time   CHOL 155 05/26/2020 0807   TRIG 184 (H) 05/26/2020 0807   HDL 45 05/26/2020 0807   CHOLHDL 3.4 05/26/2020 0807   CHOLHDL 5.4 (H) 02/19/2015 0821   VLDL 22 02/19/2015 0821   LDLCALC 79 05/26/2020 0807     07/21/16: Total cholesterol 105, triglycerides 94, HDL 36, LDL 49  Wt Readings from Last 3 Encounters:  09/12/20 245 lb 3.2 oz (111.2 kg)  04/01/20 232 lb (105.2 kg)  10/23/19 225 lb (102.1 kg)      ASSESSMENT AND PLAN: 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  Rituximab increased by Rheumatology recently.   CAD (coronary artery disease) No angina in rehab.  Continue warfarin, carvedilol, and Repatha.  Aortic aneurysm ([place]) Noted on prior echoes but not been his most recent 1.  We will repeat his echocardiogram to assess  Blood pressure is well-controlled and he is on a beta-blocker.  Chronic combined systolic and diastolic heart failure (HCC) LVEF 45 to 50%.  He is euvolemic.  It has been several years since he got an echo.  We will repeat one now.  Continue carvedilol and losartan.  If LVEF is more reduced would consider adding an SGLT2 inhibitor.  Atrial fibrillation (HCC) Currently in sinus rhythm.  He is asymptomatic when in atrial fibrillation.  Continue carvedilol and warfarin.  INRs have been  stable.  Hyperlipidemia Continue Repatha.    The patient does not have concerns regarding medicines.  The following changes have been made:  no change  Labs/ tests ordered today include:    Disposition:   F/U with Adaya Garmany C. Oval Linsey, MD in 1 year  I,Jada Bradford,acting as a scribe for [name], MD.,have documented all relevant documentation on the behalf of [name], MD,as directed by  [name], MD while in the presence of [name], MD.  I, [place] Oval Linsey, MD have reviewed all documentation for this visit.  The documentation of the exam, diagnosis, procedures, and orders on 09/12/2020 are all accurate and complete.   Signed, [name], MD  09/12/2020 11:13 AM    [place]

## 2020-09-14 DIAGNOSIS — Z23 Encounter for immunization: Secondary | ICD-10-CM | POA: Diagnosis not present

## 2020-09-16 ENCOUNTER — Encounter: Payer: Self-pay | Admitting: Physical Therapy

## 2020-09-16 ENCOUNTER — Ambulatory Visit: Payer: Medicare Other | Admitting: Physical Therapy

## 2020-09-16 ENCOUNTER — Other Ambulatory Visit: Payer: Self-pay

## 2020-09-16 DIAGNOSIS — R262 Difficulty in walking, not elsewhere classified: Secondary | ICD-10-CM

## 2020-09-16 DIAGNOSIS — M25651 Stiffness of right hip, not elsewhere classified: Secondary | ICD-10-CM

## 2020-09-16 NOTE — Therapy (Signed)
[place]. [place], [place], [address] Phone: [phone]   Fax:  [phone]  Physical Therapy Treatment  Patient Details  Name: Gary [name] MRN: [id number] Date of Birth: 10-20-1945 Referring Provider (PT): Francesco Sor   Encounter Date: 09/16/2020   PT End of Session - 09/16/20 1505     Visit Number 5    Date for PT Re-Evaluation 12/03/20    PT Start Time [id number]    PT Stop Time 1505    PT Time Calculation (min) 45 min    Activity Tolerance Patient tolerated treatment well    Behavior During Therapy [facility] for tasks assessed/performed             Past Medical History:  Diagnosis Date   Aortic aneurysm ([place]) 02/20/2016   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   Arthritis    Atrial fibrillation ([place]) 11/13/2014   Cancer ([place])    skin   Chronic combined systolic and diastolic heart failure ([place]) 02/20/2016   LVEF 45-50% 11/2014   Coronary artery disease    Depression    Essential hypertension 11/13/2014   Hyperlipidemia    Hyperlipidemia 11/13/2014   Hypertension    Sleep apnea    CPAP 'Uses half the time"  last study 2009   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    Past Surgical History:  Procedure Laterality Date   CARDIOVERSION N/A 12/06/2014   Procedure: CARDIOVERSION;  Surgeon: [name], MD;  Location: [place];  Service: Cardiovascular;  Laterality: N/A;   CARDIOVERSION N/A 05/26/2017   Procedure: CARDIOVERSION;  Surgeon: [name], MD;  Location: [place];  Service: Cardiovascular;  Laterality: N/A;   CARDIOVERSION N/A 04/25/2019   Procedure: CARDIOVERSION;  Surgeon: Sanda Klein, MD;  Location: [place];  Service: Cardiovascular;  Laterality: N/A;   CARPAL TUNNEL RELEASE     CORONARY ANGIOPLASTY     multiple stents   CORONARY ARTERY BYPASS GRAFT  2006   EYE SURGERY     right eye-  muscular repair   TOTAL HIP ARTHROPLASTY  01/03/2012   Procedure: TOTAL HIP ARTHROPLASTY;   Surgeon: [name], MD;  [facility];  Service: Orthopedics;  Laterality: Right;    There were no vitals filed for this visit.   Subjective Assessment - 09/16/20 1424     Subjective "not feeling too well today" "worked in the yard for 4 hours"    Currently in Pain? No/denies                               [facility] Adult PT Treatment/Exercise - 09/16/20 0001       Knee/Hip Exercises: Stretches   Active Hamstring Stretch Right;4 reps;20 seconds;10 seconds    Passive Hamstring Stretch Right;4 reps;20 seconds    Quad Stretch Right;4 reps;20 seconds    Gastroc Stretch Right;Left;3 reps;10 seconds;20 seconds      Knee/Hip Exercises: Aerobic   Stationary Bike level 2 x 4 minutes    Nustep L3 x 5 min      Knee/Hip Exercises: Standing   Hip Abduction Both;Stengthening;1 set;10 reps;Knee straight    Abduction Limitations 5    Hip Extension Both;Stengthening;1 set;10 reps;Knee straight    Extension Limitations 5    Walking with Sports Cord all directions 40;b    Other Standing Knee Exercises Hip Ext & abd 10lb x 10 each  PT Short Term Goals - 09/09/20 1641       PT SHORT TERM GOAL #1   Title independent with initial HEP    Status Achieved               PT Long Term Goals - 09/09/20 1641       PT LONG TERM GOAL #1   Title independent with an advanced HEP    Status On-going      PT LONG TERM GOAL #2   Title be able to cross the right leg over his left while sitting and put on sock and shoe    Status On-going                   Plan - 09/16/20 1506     Clinical Impression Statement Pt remains very tight in both hips, HS, piriformis, and calves. He does  well with all exercise intervention without reports of pain. Cue needed to control the eccentric phase of resisted gait. Increase fatigue reported with sit to stands.    Stability/Clinical Decision Making Stable/Uncomplicated    Rehab Potential  Good    PT Frequency 2x / week    PT Treatment/Interventions ADLs/Self Care Home Management;Gait training;Stair training;Functional mobility training;Therapeutic activities;Therapeutic exercise;Balance training;Neuromuscular re-education;Manual techniques;Patient/family education    PT Next Visit Plan overall conditioning and flexibility             Patient will benefit from skilled therapeutic intervention in order to improve the following deficits and impairments:  Abnormal gait, Decreased range of motion, Difficulty walking, Decreased activity tolerance, Impaired flexibility, Decreased strength, Decreased mobility  Visit Diagnosis: Stiffness of right hip, not elsewhere classified  Difficulty in walking, not elsewhere classified     Problem List Patient Active Problem List   Diagnosis Date Noted   Long term (current) use of anticoagulants 02/12/2019   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 [place], [place], [address] Phone: [phone]   Fax:  [phone]  Name: Gary [name] MRN: [id number] Date of Birth: May 08, 1945

## 2020-09-17 ENCOUNTER — Other Ambulatory Visit (HOSPITAL_BASED_OUTPATIENT_CLINIC_OR_DEPARTMENT_OTHER): Payer: Medicare Other

## 2020-09-17 ENCOUNTER — Encounter (HOSPITAL_BASED_OUTPATIENT_CLINIC_OR_DEPARTMENT_OTHER): Payer: Medicare Other

## 2020-09-18 ENCOUNTER — Encounter: Payer: Self-pay | Admitting: Physical Therapy

## 2020-09-18 ENCOUNTER — Ambulatory Visit: Payer: Medicare Other | Admitting: Physical Therapy

## 2020-09-18 ENCOUNTER — Other Ambulatory Visit: Payer: Self-pay

## 2020-09-18 DIAGNOSIS — R262 Difficulty in walking, not elsewhere classified: Secondary | ICD-10-CM

## 2020-09-18 DIAGNOSIS — M25651 Stiffness of right hip, not elsewhere classified: Secondary | ICD-10-CM | POA: Diagnosis not present

## 2020-09-18 NOTE — Therapy (Signed)
[place]. [place], [place], [address] Phone: [phone]   Fax:  [phone]  Physical Therapy Treatment  Patient Details  Name: Gary [name] MRN: [id number] Date of Birth: December 26, 1945 Referring Provider (PT): Francesco Sor   Encounter Date: 09/18/2020   PT End of Session - 09/18/20 [id number]     Visit Number 6    Date for PT Re-Evaluation 12/03/20    Authorization Type [employer]    PT Start Time 1355    PT Stop Time 1440    PT Time Calculation (min) 45 min    Activity Tolerance Patient tolerated treatment well    Behavior During Therapy [facility] for tasks assessed/performed             Past Medical History:  Diagnosis Date   Aortic aneurysm ([place]) 02/20/2016   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   Arthritis    Atrial fibrillation ([place]) 11/13/2014   Cancer ([place])    skin   Chronic combined systolic and diastolic heart failure ([place]) 02/20/2016   LVEF 45-50% 11/2014   Coronary artery disease    Depression    Essential hypertension 11/13/2014   Hyperlipidemia    Hyperlipidemia 11/13/2014   Hypertension    Sleep apnea    CPAP 'Uses half the time"  last study 2009   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    Past Surgical History:  Procedure Laterality Date   CARDIOVERSION N/A 12/06/2014   Procedure: CARDIOVERSION;  Surgeon: [name], MD;  Location: [place];  Service: Cardiovascular;  Laterality: N/A;   CARDIOVERSION N/A 05/26/2017   Procedure: CARDIOVERSION;  Surgeon: [name], MD;  Location: [place];  Service: Cardiovascular;  Laterality: N/A;   CARDIOVERSION N/A 04/25/2019   Procedure: CARDIOVERSION;  Surgeon: Sanda Klein, MD;  Location: [place];  Service: Cardiovascular;  Laterality: N/A;   CARPAL TUNNEL RELEASE     CORONARY ANGIOPLASTY     multiple stents   CORONARY ARTERY BYPASS GRAFT  2006   EYE SURGERY     right eye-  muscular repair   TOTAL HIP ARTHROPLASTY  01/03/2012    Procedure: TOTAL HIP ARTHROPLASTY;  Surgeon: [name], MD;  [facility];  Service: Orthopedics;  Laterality: Right;    There were no vitals filed for this visit.   Subjective Assessment - 09/18/20 1357     Subjective Feeling prtty good    Currently in Pain? No/denies                               [facility] Adult PT Treatment/Exercise - 09/18/20 0001       Knee/Hip Exercises: Stretches   Passive Hamstring Stretch Right;4 reps;20 seconds    Quad Stretch Right;4 reps;20 seconds    Piriformis Stretch Right;4 reps;20 seconds    Gastroc Stretch Right;Left;3 reps;10 seconds;20 seconds      Knee/Hip Exercises: Aerobic   Recumbent Bike level 4 x 6 minutes      Knee/Hip Exercises: Machines for Strengthening   Cybex Knee Extension 15# 2x10    Cybex Knee Flexion 35lb 2x10    Cybex Leg Press 20# right only 2x10 then 20# both legs deeper bend for the ROM, then no weight for the ROM      Knee/Hip Exercises: Standing   Hip Flexion Right;2 sets;10 reps    Hip Flexion Limitations 5#    Walking with Sports Cord all directions 40;b    Other Standing  Knee Exercises Hip Ext & abd 5# 2x10                       PT Short Term Goals - 09/09/20 1641       PT SHORT TERM GOAL #1   Title independent with initial HEP    Status Achieved               PT Long Term Goals - 09/18/20 1457       PT LONG TERM GOAL #1   Title independent with an advanced HEP    Status On-going      PT LONG TERM GOAL #2   Title be able to cross the right leg over his left while sitting and put on sock and shoe    Status On-going      PT LONG TERM GOAL #3   Title report no difficulty walking to the mailbox and back    Status On-going      PT LONG TERM GOAL #4   Title increase strength of the right hip to 4+/5    Status On-going                   Plan - 09/18/20 1456     Clinical Impression Statement Patient reports that he is walking better and feels  more confident, reports that the hip still feels very tight and he can tell it is weaker, needs some cues for decreased compensation and needs a lot of instrucion at times to go slower    PT Next Visit Plan overall conditioning and flexibility    Consulted and Agree with Plan of Care Patient             Patient will benefit from skilled therapeutic intervention in order to improve the following deficits and impairments:  Abnormal gait, Decreased range of motion, Difficulty walking, Decreased activity tolerance, Impaired flexibility, Decreased strength, Decreased mobility  Visit Diagnosis: Stiffness of right hip, not elsewhere classified  Difficulty in walking, not elsewhere classified     Problem List Patient Active Problem List   Diagnosis Date Noted   Long term (current) use of anticoagulants 02/12/2019   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 [place], [place], [address] Phone: [phone]   Fax:  [phone]  Name: Gary [name] MRN: [id number] Date of Birth: [birth year]

## 2020-09-19 ENCOUNTER — Ambulatory Visit (INDEPENDENT_AMBULATORY_CARE_PROVIDER_SITE_OTHER): Payer: Medicare Other

## 2020-09-19 DIAGNOSIS — I5042 Chronic combined systolic (congestive) and diastolic (congestive) heart failure: Secondary | ICD-10-CM | POA: Diagnosis not present

## 2020-09-19 DIAGNOSIS — I6523 Occlusion and stenosis of bilateral carotid arteries: Secondary | ICD-10-CM | POA: Diagnosis not present

## 2020-09-19 LAB — ECHOCARDIOGRAM COMPLETE
Area-P 1/2: 3.77 cm2
S' Lateral: 3.74 cm

## 2020-09-22 ENCOUNTER — Ambulatory Visit (INDEPENDENT_AMBULATORY_CARE_PROVIDER_SITE_OTHER): Payer: Medicare Other

## 2020-09-22 ENCOUNTER — Other Ambulatory Visit: Payer: Self-pay

## 2020-09-22 DIAGNOSIS — I480 Paroxysmal atrial fibrillation: Secondary | ICD-10-CM | POA: Diagnosis not present

## 2020-09-22 DIAGNOSIS — Z7901 Long term (current) use of anticoagulants: Secondary | ICD-10-CM | POA: Diagnosis not present

## 2020-09-22 LAB — POCT INR: INR: 3.5 — AB (ref 2.0–3.0)

## 2020-09-22 NOTE — Patient Instructions (Signed)
Hold Tuesday and then Continue taking warfarin '5mg'$  daily and repeat INR in 2 weeks.  CHADS-VASc = 4

## 2020-09-23 ENCOUNTER — Ambulatory Visit: Payer: Medicare Other | Admitting: Physical Therapy

## 2020-09-23 ENCOUNTER — Encounter: Payer: Self-pay | Admitting: Physical Therapy

## 2020-09-23 DIAGNOSIS — M25651 Stiffness of right hip, not elsewhere classified: Secondary | ICD-10-CM | POA: Diagnosis not present

## 2020-09-23 DIAGNOSIS — R262 Difficulty in walking, not elsewhere classified: Secondary | ICD-10-CM

## 2020-09-23 NOTE — Therapy (Signed)
[facility] [place], [place], [address] Phone: [phone]   Fax:  [phone]  Physical Therapy Treatment  Patient Details  Name: Gary [name] MRN: [id number] Date of Birth: [birth year] Referring Provider (PT): Francesco Sor   Encounter Date: 09/23/2020   PT End of Session - 09/23/20 1010     Visit Number 7    Date for PT Re-Evaluation 12/03/20    Authorization Type [employer]    PT Start Time 0930    PT Stop Time 1009    PT Time Calculation (min) 39 min    Activity Tolerance Patient tolerated treatment well    Behavior During Therapy [facility] for tasks assessed/performed             Past Medical History:  Diagnosis Date   Aortic aneurysm ([place]) 02/20/2016   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   Arthritis    Atrial fibrillation ([place]) 11/13/2014   Cancer ([place])    skin   Chronic combined systolic and diastolic heart failure ([place]) 02/20/2016   LVEF 45-50% 11/2014   Coronary artery disease    Depression    Essential hypertension 11/13/2014   Hyperlipidemia    Hyperlipidemia 11/13/2014   Hypertension    Sleep apnea    CPAP 'Uses half the time"  last study 2009   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    Past Surgical History:  Procedure Laterality Date   CARDIOVERSION N/A 12/06/2014   Procedure: CARDIOVERSION;  Surgeon: [name], MD;  Location: [place];  Service: Cardiovascular;  Laterality: N/A;   CARDIOVERSION N/A 05/26/2017   Procedure: CARDIOVERSION;  Surgeon: [name], MD;  Location: [place];  Service: Cardiovascular;  Laterality: N/A;   CARDIOVERSION N/A 04/25/2019   Procedure: CARDIOVERSION;  Surgeon: Sanda Klein, MD;  Location: [place];  Service: Cardiovascular;  Laterality: N/A;   CARPAL TUNNEL RELEASE     CORONARY ANGIOPLASTY     multiple stents   CORONARY ARTERY BYPASS GRAFT  2006   EYE SURGERY     right eye-  muscular repair   TOTAL HIP ARTHROPLASTY  01/03/2012    Procedure: TOTAL HIP ARTHROPLASTY;  Surgeon: [name], MD;  [facility];  Service: Orthopedics;  Laterality: Right;    There were no vitals filed for this visit.   Subjective Assessment - 09/23/20 0931     Subjective Doing good.    Pertinent History AAA, HTN, cardioversion, afib    Patient Stated Goals better ROM, less fatigue    Currently in Pain? No/denies                               [facility] Adult PT Treatment/Exercise - 09/23/20 0001       Knee/Hip Exercises: Stretches   Passive Hamstring Stretch Right;Left;1 rep;30 seconds    Passive Hamstring Stretch Limitations supine with strap    Piriformis Stretch Right;Left;30 seconds;2 reps    Piriformis Stretch Limitations supine KTOS and fig 4      Knee/Hip Exercises: Aerobic   Recumbent Bike level 3 x 6 minutes      Knee/Hip Exercises: Standing   Hip Flexion Right;2 sets;10 reps    Hip Flexion Limitations resisted march with red loop    Hip Abduction Both;Stengthening;1 set;10 reps;Knee straight    Abduction Limitations yellow loop    Hip Extension Both;Stengthening;1 set;Knee straight;15 reps    Extension Limitations yellow loop    Forward Step Up Right;1  set;10 reps;Hand Hold: 1;Step Height: 6"    Forward Step Up Limitations R step up/back   focus on slow eccentric phase; 1 UE support   Functional Squat 1 set;10 reps    Functional Squat Limitations in II bars      Knee/Hip Exercises: Supine   Bridges with Ball Squeeze Strengthening;Both;1 set;10 reps    Bridges with Clamshell Strengthening;Both;1 set;10 reps   green TB   Other Supine Knee/Hip Exercises hooklying clam with green TB x15                     PT Education - 09/23/20 1009     Education Details update to [employer]) Educated Patient    Methods Explanation;Demonstration;Tactile cues;Verbal cues;Handout    Comprehension Verbalized understanding;Returned demonstration              PT Short Term Goals -  09/09/20 1641       PT SHORT TERM GOAL #1   Title independent with initial HEP    Status Achieved               PT Long Term Goals - 09/18/20 1457       PT LONG TERM GOAL #1   Title independent with an advanced HEP    Status On-going      PT LONG TERM GOAL #2   Title be able to cross the right leg over his left while sitting and put on sock and shoe    Status On-going      PT LONG TERM GOAL #3   Title report no difficulty walking to the mailbox and back    Status On-going      PT LONG TERM GOAL #4   Title increase strength of the right hip to 4+/5    Status On-going                   Plan - 09/23/20 1010     Clinical Impression Statement Patient arrived to session without new complaints. Worked on gentle LE stretching with patient tolerating this well with some assistance from UEs. Hip strengthening mat ther-ex required corrective cueing to avoid overuse of lumbar paraspinals to perform bridges. Improved glute activation evident after cueing. Standing hip strengthening revealed good squat form. R/L lateral trunk lean evident with hip abduction, requiring cues to improve. Updated HEP with standing hip strengthening- patient reported understanding. No complaints at end of session.    PT Treatment/Interventions ADLs/Self Care Home Management;Gait training;Stair training;Functional mobility training;Therapeutic activities;Therapeutic exercise;Balance training;Neuromuscular re-education;Manual techniques;Patient/family education    PT Next Visit Plan overall conditioning and flexibility    Consulted and Agree with Plan of Care Patient             Patient will benefit from skilled therapeutic intervention in order to improve the following deficits and impairments:  Abnormal gait, Decreased range of motion, Difficulty walking, Decreased activity tolerance, Impaired flexibility, Decreased strength, Decreased mobility  Visit Diagnosis: Stiffness of right hip, not  elsewhere classified  Difficulty in walking, not elsewhere classified     Problem List Patient Active Problem List   Diagnosis Date Noted   Long term (current) use of anticoagulants 02/12/2019   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 [place], [place], [address] Phone: [phone]   Fax:  [phone]  Name: MAYFORD ALBERG MRN: [id number] Date of Birth: 09/25/1945

## 2020-09-25 DIAGNOSIS — M3130 Wegener's granulomatosis without renal involvement: Secondary | ICD-10-CM | POA: Diagnosis not present

## 2020-10-06 ENCOUNTER — Other Ambulatory Visit: Payer: Self-pay

## 2020-10-06 ENCOUNTER — Ambulatory Visit (INDEPENDENT_AMBULATORY_CARE_PROVIDER_SITE_OTHER): Payer: Medicare Other

## 2020-10-06 DIAGNOSIS — Z7901 Long term (current) use of anticoagulants: Secondary | ICD-10-CM

## 2020-10-06 DIAGNOSIS — I480 Paroxysmal atrial fibrillation: Secondary | ICD-10-CM

## 2020-10-06 LAB — POCT INR: INR: 3.2 — AB (ref 2.0–3.0)

## 2020-10-06 NOTE — Patient Instructions (Signed)
Continue taking warfarin 5mg  daily and repeat INR in 4 weeks. Eat greens tonight.  CHADS-VASc = 4

## 2020-10-07 ENCOUNTER — Encounter: Payer: Self-pay | Admitting: Physical Therapy

## 2020-10-07 ENCOUNTER — Ambulatory Visit: Payer: Medicare Other | Attending: Internal Medicine | Admitting: Physical Therapy

## 2020-10-07 DIAGNOSIS — R262 Difficulty in walking, not elsewhere classified: Secondary | ICD-10-CM | POA: Insufficient documentation

## 2020-10-07 DIAGNOSIS — M25651 Stiffness of right hip, not elsewhere classified: Secondary | ICD-10-CM | POA: Diagnosis not present

## 2020-10-07 NOTE — Therapy (Signed)
[place]. [place], [place], [address] Phone: [phone]   Fax:  [phone]  Physical Therapy Treatment  Patient Details  Name: Gary [name] MRN: [id number] Date of Birth: [birth year] Referring Provider (PT): Francesco Sor   Encounter Date: 10/07/2020   PT End of Session - 10/07/20 1513     Visit Number 8    Date for PT Re-Evaluation 12/03/20    Authorization Type [employer]    PT Start Time 1430    PT Stop Time 9163    PT Time Calculation (min) 45 min    Activity Tolerance Patient tolerated treatment well    Behavior During Therapy [facility] for tasks assessed/performed             Past Medical History:  Diagnosis Date   Aortic aneurysm ([place]) 02/20/2016   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   Arthritis    Atrial fibrillation ([place]) 11/13/2014   Cancer ([place])    skin   Chronic combined systolic and diastolic heart failure ([place]) 02/20/2016   LVEF 45-50% 11/2014   Coronary artery disease    Depression    Essential hypertension 11/13/2014   Hyperlipidemia    Hyperlipidemia 11/13/2014   Hypertension    Sleep apnea    CPAP 'Uses half the time"  last study 2009   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    Past Surgical History:  Procedure Laterality Date   CARDIOVERSION N/A 12/06/2014   Procedure: CARDIOVERSION;  Surgeon: [name], MD;  Location: [place];  Service: Cardiovascular;  Laterality: N/A;   CARDIOVERSION N/A 05/26/2017   Procedure: CARDIOVERSION;  Surgeon: [name], MD;  Location: [place];  Service: Cardiovascular;  Laterality: N/A;   CARDIOVERSION N/A 04/25/2019   Procedure: CARDIOVERSION;  Surgeon: Sanda Klein, MD;  Location: [place];  Service: Cardiovascular;  Laterality: N/A;   CARPAL TUNNEL RELEASE     CORONARY ANGIOPLASTY     multiple stents   CORONARY ARTERY BYPASS GRAFT  2006   EYE SURGERY     right eye-  muscular repair   TOTAL HIP ARTHROPLASTY  01/03/2012    Procedure: TOTAL HIP ARTHROPLASTY;  Surgeon: [name], MD;  [facility];  Service: Orthopedics;  Laterality: Right;    There were no vitals filed for this visit.   Subjective Assessment - 10/07/20 1432     Subjective "I am doing ok"    Currently in Pain? Yes    Pain Score 3     Pain Location Knee    Pain Orientation Right;Left                               [facility] Adult PT Treatment/Exercise - 10/07/20 0001       Knee/Hip Exercises: Aerobic   Recumbent Bike level 3 x 4 minutes    Nustep L5 x 5 min      Knee/Hip Exercises: Machines for Strengthening   Cybex Knee Flexion 35lb 2x10    Cybex Leg Press 50lb 2x15      Knee/Hip Exercises: Standing   Heel Raises Both;2 sets;10 reps    Walking with Sports Cord all directions 40lb x4 each    Other Standing Knee Exercises Hip Ext & abd 5# 2x10      Knee/Hip Exercises: Seated   Sit to Sand 2 sets;10 reps;without UE support      Knee/Hip Exercises: Supine   Quad Sets --   OHP blue  ball                      PT Short Term Goals - 09/09/20 1641       PT SHORT TERM GOAL #1   Title independent with initial HEP    Status Achieved               PT Long Term Goals - 09/18/20 1457       PT LONG TERM GOAL #1   Title independent with an advanced HEP    Status On-going      PT LONG TERM GOAL #2   Title be able to cross the right leg over his left while sitting and put on sock and shoe    Status On-going      PT LONG TERM GOAL #3   Title report no difficulty walking to the mailbox and back    Status On-going      PT LONG TERM GOAL #4   Title increase strength of the right hip to 4+/5    Status On-going                   Plan - 10/07/20 1515     Clinical Impression Statement Pt did well with a continuation of LE strengthening. Bilateral hip weakness and fatigue noted with standing hip extensions and abduction. Cue needed to maintain neutral spine with sit to stand  over head press. Increase resistance tolerated on leg press. No reports of increase pain.    Stability/Clinical Decision Making Stable/Uncomplicated    Rehab Potential Good    PT Frequency 2x / week    PT Treatment/Interventions ADLs/Self Care Home Management;Gait training;Stair training;Functional mobility training;Therapeutic activities;Therapeutic exercise;Balance training;Neuromuscular re-education;Manual techniques;Patient/family education    PT Next Visit Plan overall conditioning and flexibility             Patient will benefit from skilled therapeutic intervention in order to improve the following deficits and impairments:  Abnormal gait, Decreased range of motion, Difficulty walking, Decreased activity tolerance, Impaired flexibility, Decreased strength, Decreased mobility  Visit Diagnosis: Difficulty in walking, not elsewhere classified  Stiffness of right hip, not elsewhere classified     Problem List Patient Active Problem List   Diagnosis Date Noted   Long term (current) use of anticoagulants 02/12/2019   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 [place], [place], [address] Phone: [phone]   Fax:  [phone]  Name: Gary [name] MRN: [id number] Date of Birth: 1945/05/16

## 2020-10-09 ENCOUNTER — Encounter (HOSPITAL_BASED_OUTPATIENT_CLINIC_OR_DEPARTMENT_OTHER): Payer: Self-pay

## 2020-10-14 ENCOUNTER — Ambulatory Visit: Payer: Medicare Other | Admitting: Physical Therapy

## 2020-10-14 ENCOUNTER — Encounter: Payer: Self-pay | Admitting: Physical Therapy

## 2020-10-14 ENCOUNTER — Other Ambulatory Visit: Payer: Self-pay

## 2020-10-14 DIAGNOSIS — R262 Difficulty in walking, not elsewhere classified: Secondary | ICD-10-CM | POA: Diagnosis not present

## 2020-10-14 DIAGNOSIS — M25651 Stiffness of right hip, not elsewhere classified: Secondary | ICD-10-CM

## 2020-10-14 NOTE — Therapy (Signed)
[facility]. [place], [place], [address] Phone: [phone]   Fax:  [phone]  Physical Therapy Treatment  Patient Details  Name: Gary [name] MRN: [id number] Date of Birth: 1945-02-28 Referring Provider (PT): Francesco Sor   Encounter Date: 10/14/2020   PT End of Session - 10/14/20 1511     Visit Number 9    Date for PT Re-Evaluation 12/03/20    Authorization Type [employer]    PT Start Time 1430    PT Stop Time 9326    PT Time Calculation (min) 41 min    Activity Tolerance Patient tolerated treatment well    Behavior During Therapy [facility] for tasks assessed/performed             Past Medical History:  Diagnosis Date   Aortic aneurysm ([place]) 02/20/2016   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   Arthritis    Atrial fibrillation ([place]) 11/13/2014   Cancer ([place])    skin   Chronic combined systolic and diastolic heart failure ([place]) 02/20/2016   LVEF 45-50% 11/2014   Coronary artery disease    Depression    Essential hypertension 11/13/2014   Hyperlipidemia    Hyperlipidemia 11/13/2014   Hypertension    Sleep apnea    CPAP 'Uses half the time"  last study 2009   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    Past Surgical History:  Procedure Laterality Date   CARDIOVERSION N/A 12/06/2014   Procedure: CARDIOVERSION;  Surgeon: [name], MD;  Location: [place];  Service: Cardiovascular;  Laterality: N/A;   CARDIOVERSION N/A 05/26/2017   Procedure: CARDIOVERSION;  Surgeon: [name], MD;  Location: [place];  Service: Cardiovascular;  Laterality: N/A;   CARDIOVERSION N/A 04/25/2019   Procedure: CARDIOVERSION;  Surgeon: Sanda Klein, MD;  Location: [place];  Service: Cardiovascular;  Laterality: N/A;   CARPAL TUNNEL RELEASE     CORONARY ANGIOPLASTY     multiple stents   CORONARY ARTERY BYPASS GRAFT  2006   EYE SURGERY     right eye-  muscular repair   TOTAL HIP ARTHROPLASTY  01/03/2012    Procedure: TOTAL HIP ARTHROPLASTY;  Surgeon: [name], MD;  [facility];  Service: Orthopedics;  Laterality: Right;    There were no vitals filed for this visit.   Subjective Assessment - 10/14/20 1428     Subjective "Its going good"    Currently in Pain? No/denies                [facility] PT Assessment - 10/14/20 0001       Strength   Right Hip Flexion 4+/5    Right Hip External Rotation  4+/5    Right Hip Internal Rotation 4+/5    Right Hip ABduction 4+/5    Right Hip ADduction 4+/5                           [facility] Adult PT Treatment/Exercise - 10/14/20 0001       Knee/Hip Exercises: Stretches   Passive Hamstring Stretch Right;Left;3 reps;20 seconds;10 seconds    Passive Hamstring Stretch Limitations supine with strap    Piriformis Stretch Right;Left;30 seconds;2 reps    Piriformis Stretch Limitations supine KTOS and fig 4      Knee/Hip Exercises: Aerobic   Nustep L5 x 6 min      Knee/Hip Exercises: Machines for Strengthening   Cybex Knee Extension 15lb 2x10    Cybex Knee Flexion  35lb 2x15    Cybex Leg Press 50lb 2x15      Knee/Hip Exercises: Standing   Lateral Step Up Both;10 reps;1 set;Step Height: 8";Hand Hold: 0    Other Standing Knee Exercises Hip Ext, add &, abd 5# x10 bilat      Knee/Hip Exercises: Seated   Sit to Sand 2 sets;10 reps;without UE support   20lb KB                      PT Short Term Goals - 09/09/20 1641       PT SHORT TERM GOAL #1   Title independent with initial HEP    Status Achieved               PT Long Term Goals - 10/14/20 1435       PT LONG TERM GOAL #1   Title independent with an advanced HEP    Status On-going      PT LONG TERM GOAL #2   Title be able to cross the right leg over his left while sitting and put on sock and shoe    Status On-going      PT LONG TERM GOAL #3   Title report no difficulty walking to the mailbox and back    Status Partially Met                    Plan - 10/14/20 1512     Clinical Impression Statement Pt has progressed increasing his MMT hip strength. Pt able to tolerate sit to stands with additional resistance. Increase hip fatigue with standing hip exercises. Tactile sue to prevent postural leaning with hip extensions. No reports of increase pain. Bilateral hip tightness remains.    Stability/Clinical Decision Making Stable/Uncomplicated    Rehab Potential Good    PT Frequency 2x / week    PT Duration 12 weeks    PT Treatment/Interventions ADLs/Self Care Home Management;Gait training;Stair training;Functional mobility training;Therapeutic activities;Therapeutic exercise;Balance training;Neuromuscular re-education;Manual techniques;Patient/family education    PT Next Visit Plan overall conditioning and flexibility             Patient will benefit from skilled therapeutic intervention in order to improve the following deficits and impairments:  Abnormal gait, Decreased range of motion, Difficulty walking, Decreased activity tolerance, Impaired flexibility, Decreased strength, Decreased mobility  Visit Diagnosis: Difficulty in walking, not elsewhere classified  Stiffness of right hip, not elsewhere classified     Problem List Patient Active Problem List   Diagnosis Date Noted   Long term (current) use of anticoagulants 02/12/2019   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 [place], [place], [address] Phone: [phone]   Fax:  [phone]  Name: Gary [name] MRN: [id number] Date of Birth: 1945-10-22

## 2020-10-21 ENCOUNTER — Other Ambulatory Visit: Payer: Self-pay

## 2020-10-21 ENCOUNTER — Ambulatory Visit: Payer: Medicare Other | Admitting: Physical Therapy

## 2020-10-21 ENCOUNTER — Encounter: Payer: Self-pay | Admitting: Physical Therapy

## 2020-10-21 DIAGNOSIS — R262 Difficulty in walking, not elsewhere classified: Secondary | ICD-10-CM

## 2020-10-21 DIAGNOSIS — M25651 Stiffness of right hip, not elsewhere classified: Secondary | ICD-10-CM | POA: Diagnosis not present

## 2020-10-21 NOTE — Therapy (Signed)
[place]. [place], [place], [address] Phone: [phone]   Fax:  [phone] Progress Note Reporting Period 09/02/20 to 10/21/20 for the first 10 visits  See note below for Objective Data and Assessment of Progress/Goals.     Physical Therapy Treatment  Patient Details  Name: Gary [name] MRN: [id number] Date of Birth: Oct 21, 1945 Referring Provider (PT): Francesco Sor   Encounter Date: 10/21/2020   PT End of Session - 10/21/20 1604     Visit Number 10    Date for PT Re-Evaluation 12/03/20    Authorization Type [employer]    PT Start Time 1430    PT Stop Time 2751    PT Time Calculation (min) 45 min    Activity Tolerance Patient tolerated treatment well    Behavior During Therapy [facility] for tasks assessed/performed             Past Medical History:  Diagnosis Date   Aortic aneurysm (Gary [name]) 02/20/2016   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   Arthritis    Atrial fibrillation (Gary [name]) 11/13/2014   Cancer (Gary [name])    skin   Chronic combined systolic and diastolic heart failure (Gary [name]) 02/20/2016   LVEF 45-50% 11/2014   Coronary artery disease    Depression    Essential hypertension 11/13/2014   Hyperlipidemia    Hyperlipidemia 11/13/2014   Hypertension    Sleep apnea    CPAP 'Uses half the time"  last study 2009   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;  Service: Cardiovascular;  Laterality: N/A;   CARDIOVERSION N/A 04/25/2019   Procedure: CARDIOVERSION;  Surgeon: Gary Klein, MD;  Location: [place];  Service: Cardiovascular;  Laterality: N/A;   CARPAL TUNNEL RELEASE     CORONARY ANGIOPLASTY      multiple stents   CORONARY ARTERY BYPASS GRAFT  2006   EYE SURGERY     right eye-  muscular repair   TOTAL HIP ARTHROPLASTY  01/03/2012   Procedure: TOTAL HIP ARTHROPLASTY;  Surgeon: Gary Bastos, MD;  [facility];  Service: Orthopedics;  Laterality: Right;    There were no vitals filed for this visit.   Subjective Assessment - 10/21/20 1432     Subjective Doing good    Currently in Pain? No/denies                [name] PT Assessment - 10/21/20 0001       Strength   Right Hip Flexion 4+/5    Right Hip External Rotation  4+/5    Right Hip Internal Rotation 4+/5    Right Hip ABduction 4+/5    Right Hip ADduction 4+/5                           [facility] Adult PT Treatment/Exercise - 10/21/20 0001       Ambulation/Gait   Gait Comments Gait outside in parking lot around back building. 2standing rest breaks      Knee/Hip Exercises: Stretches   Active Hamstring Stretch Both;5 reps;10 seconds      Knee/Hip Exercises: Aerobic   Elliptical L2 x 2 min    Nustep L5 x 48mn  Knee/Hip Exercises: Machines for Strengthening   Cybex Leg Press 50lb 2x15      Knee/Hip Exercises: Standing   Forward Step Up Both;1 set;10 reps;Hand Hold: 0;Step Height: 8"                       PT Short Term Goals - 09/09/20 1641       PT SHORT TERM GOAL #1   Title independent with initial HEP    Status Achieved               PT Long Term Goals - 10/21/20 1434       PT LONG TERM GOAL #2   Title be able to cross the right leg over his left while sitting and put on sock and shoe    Status Partially Met   use sock assist, can lift let up     PT LONG TERM GOAL #3   Title report no difficulty walking to the mailbox and back    Status Achieved      PT LONG TERM GOAL #4   Title increase strength of the right hip to 4+/5    Status Achieved                   Plan - 10/21/20 1606     Clinical Impression Statement Pt has progressed  meeting some goals. Added outdoor ambulation to today's session. Increase fatigue and decrease gait velocity noted with uphill ambulation. Increase resistance tolerated with resisted side steps. Some instability present with step ups when pt started to fatigue. No reports of pain. Pt doe fatigue with activity.    Stability/Clinical Decision Making Stable/Uncomplicated    Rehab Potential Good    PT Frequency 2x / week    PT Duration 12 weeks    PT Treatment/Interventions ADLs/Self Care Home Management;Gait training;Stair training;Functional mobility training;Therapeutic activities;Therapeutic exercise;Balance training;Neuromuscular re-education;Manual techniques;Patient/family education    PT Next Visit Plan overall conditioning and flexibility             Patient will benefit from skilled therapeutic intervention in order to improve the following deficits and impairments:  Abnormal gait, Decreased range of motion, Difficulty walking, Decreased activity tolerance, Impaired flexibility, Decreased strength, Decreased mobility  Visit Diagnosis: Difficulty in walking, not elsewhere classified  Stiffness of right hip, not elsewhere classified     Problem List Patient Active Problem List   Diagnosis Date Noted   Long term (current) use of anticoagulants 02/12/2019   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) 05/03/2017   Pulmonary edema cardiac cause ([place]) 05/03/2017   Respiratory failure ([place]) 05/03/2017   Chronic combined systolic and diastolic heart failure ([name]) 02/20/2016   Aortic aneurysm ([place]) 02/20/2016   CAD (coronary artery disease) 11/13/2014   Essential hypertension 11/13/2014   Hyperlipidemia 11/13/2014   Atrial fibrillation ([place]) 11/13/2014   Osteoarthritis of right hip 01/03/2012    Gary [name], PTA 10/21/2020, 4:08 PM  [name] [place], [place],  [address] Phone: [phone]   Fax:  [phone]  Name: [name] MRN: [id number] Date of Birth: 1945/09/01

## 2020-10-28 ENCOUNTER — Ambulatory Visit: Payer: Medicare Other | Admitting: Physical Therapy

## 2020-10-28 ENCOUNTER — Encounter: Payer: Self-pay | Admitting: Physical Therapy

## 2020-10-28 ENCOUNTER — Other Ambulatory Visit: Payer: Self-pay

## 2020-10-28 DIAGNOSIS — M25651 Stiffness of right hip, not elsewhere classified: Secondary | ICD-10-CM | POA: Diagnosis not present

## 2020-10-28 DIAGNOSIS — R262 Difficulty in walking, not elsewhere classified: Secondary | ICD-10-CM | POA: Diagnosis not present

## 2020-10-28 NOTE — Therapy (Signed)
[place]. [place], [place], [address] Phone: [phone]   Fax:  [phone]  Physical Therapy Treatment  Patient Details  Name: Gary [name] MRN: [id number] Date of Birth: [birth year] Referring Provider (PT): Francesco Sor   Encounter Date: 10/28/2020   PT End of Session - 10/28/20 1609     Visit Number 11    Date for PT Re-Evaluation 12/03/20    Authorization Type [employer]    PT Start Time 3159    PT Stop Time 1608    PT Time Calculation (min) 53 min    Activity Tolerance Patient tolerated treatment well    Behavior During Therapy [facility] for tasks assessed/performed             Past Medical History:  Diagnosis Date   Aortic aneurysm ([place]) 02/20/2016   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   Arthritis    Atrial fibrillation ([place]) 11/13/2014   Cancer ([place])    skin   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/2016   LVEF 45-50% 11/2014   Coronary artery disease    Depression    Essential hypertension 11/13/2014   Hyperlipidemia    Hyperlipidemia 11/13/2014   Hypertension    Sleep apnea    CPAP 'Uses half the time"  last study 2009   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    Past Surgical History:  Procedure Laterality Date   CARDIOVERSION N/A 12/06/2014   Procedure: CARDIOVERSION;  Surgeon: [name], MD;  Location: [place];  Service: Cardiovascular;  Laterality: N/A;   CARDIOVERSION N/A 05/26/2017   Procedure: CARDIOVERSION;  Surgeon: [name], MD;  Location: [place];  Service: Cardiovascular;  Laterality: N/A;   CARDIOVERSION N/A 04/25/2019   Procedure: CARDIOVERSION;  Surgeon: Sanda Klein, MD;  Location: [facility];  Service: Cardiovascular;  Laterality: N/A;   CARPAL TUNNEL RELEASE     CORONARY ANGIOPLASTY     multiple stents   CORONARY ARTERY BYPASS GRAFT  2006   EYE SURGERY     right eye-  muscular repair   TOTAL HIP ARTHROPLASTY  01/03/2012    Procedure: TOTAL HIP ARTHROPLASTY;  Surgeon: [name], MD;  [facility];  Service: Orthopedics;  Laterality: Right;    There were no vitals filed for this visit.   Subjective Assessment - 10/28/20 1607     Subjective pretty good, just ger winded easy    Currently in Pain? No/denies                               [facility] Adult PT Treatment/Exercise - 10/28/20 0001       Ambulation/Gait   Gait Comments Gait outside in parking lot around back building. 2standing rest breaks, very short of breath coming up the hill      Knee/Hip Exercises: Stretches   Passive Hamstring Stretch Right;Left;5 reps;20 seconds    Piriformis Stretch Right;Left;5 reps;20 seconds    Gastroc Stretch Right;Left;5 reps;20 seconds      Knee/Hip Exercises: Aerobic   Recumbent Bike level 5 x 6 minutes, very short of breath      Knee/Hip Exercises: Machines for Strengthening   Cybex Knee Extension 15lb 2x10    Cybex Knee Flexion 35lb 2x15                       PT Short Term Goals - 09/09/20 1641  PT SHORT TERM GOAL #1   Title independent with initial HEP    Status Achieved               PT Long Term Goals - 10/28/20 1611       PT LONG TERM GOAL #1   Title independent with an advanced HEP    Status Partially Met      PT LONG TERM GOAL #2   Title be able to cross the right leg over his left while sitting and put on sock and shoe    Status Partially Met      PT LONG TERM GOAL #3   Title report no difficulty walking to the mailbox and back    Status Achieved      PT LONG TERM GOAL #4   Title increase strength of the right hip to 4+/5    Status Achieved                   Plan - 10/28/20 1609     Clinical Impression Statement Patient very tight HS and calves still, HE continues to become short of breath and fatigue easily with the bike and with walking.  He reports that he did go to the [facility] and lookat it for when he is  discharged, he had some questions, i asked him to take pictures and bring them in and we can educate him on their use and try to assure his safety    PT Next Visit Plan overall conditioning and flexibility, look at what he can and will do after PT d/c    Consulted and Agree with Plan of Care Patient             Patient will benefit from skilled therapeutic intervention in order to improve the following deficits and impairments:  Abnormal gait, Decreased range of motion, Difficulty walking, Decreased activity tolerance, Impaired flexibility, Decreased strength, Decreased mobility  Visit Diagnosis: Difficulty in walking, not elsewhere classified  Stiffness of right hip, not elsewhere classified     Problem List Patient Active Problem List   Diagnosis Date Noted   Long term (current) use of anticoagulants 02/12/2019   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 [place], [place], [address] Phone: [phone]   Fax:  [phone]  Name: Gary [name] MRN: [id number] Date of Birth: 1945-08-11

## 2020-11-03 ENCOUNTER — Other Ambulatory Visit: Payer: Self-pay

## 2020-11-03 ENCOUNTER — Ambulatory Visit (INDEPENDENT_AMBULATORY_CARE_PROVIDER_SITE_OTHER): Payer: Medicare Other

## 2020-11-03 DIAGNOSIS — Z7901 Long term (current) use of anticoagulants: Secondary | ICD-10-CM

## 2020-11-03 DIAGNOSIS — I480 Paroxysmal atrial fibrillation: Secondary | ICD-10-CM

## 2020-11-03 LAB — POCT INR: INR: 2.5 (ref 2.0–3.0)

## 2020-11-03 NOTE — Patient Instructions (Signed)
Continue taking warfarin 5mg  daily and repeat INR in 8 weeks.   CHADS-VASc = 4

## 2020-11-04 ENCOUNTER — Ambulatory Visit: Payer: Medicare Other | Attending: Internal Medicine | Admitting: Physical Therapy

## 2020-11-04 ENCOUNTER — Encounter: Payer: Self-pay | Admitting: Physical Therapy

## 2020-11-04 DIAGNOSIS — R262 Difficulty in walking, not elsewhere classified: Secondary | ICD-10-CM | POA: Insufficient documentation

## 2020-11-04 DIAGNOSIS — M25651 Stiffness of right hip, not elsewhere classified: Secondary | ICD-10-CM | POA: Diagnosis not present

## 2020-11-04 NOTE — Therapy (Signed)
[place]. [place], [place], [address] Phone: [phone]   Fax:  [phone]  Physical Therapy Treatment  Patient Details  Name: Gary [name] MRN: [id number] Date of Birth: Jan 09, 1945 Referring Provider (PT): Francesco Sor   Encounter Date: 11/04/2020   PT End of Session - 11/04/20 1510     Visit Number 12    Date for PT Re-Evaluation 12/03/20    PT Start Time 1430    PT Stop Time 1510    PT Time Calculation (min) 40 min    Activity Tolerance Patient tolerated treatment well    Behavior During Therapy [facility] for tasks assessed/performed             Past Medical History:  Diagnosis Date   Aortic aneurysm ([place]) 02/20/2016   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   Arthritis    Atrial fibrillation ([place]) 11/13/2014   Cancer ([place])    skin   Chronic combined systolic and diastolic heart failure ([place]) 02/20/2016   LVEF 45-50% 11/2014   Coronary artery disease    Depression    Essential hypertension 11/13/2014   Hyperlipidemia    Hyperlipidemia 11/13/2014   Hypertension    Sleep apnea    CPAP 'Uses half the time"  last study 2009   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    Past Surgical History:  Procedure Laterality Date   CARDIOVERSION N/A 12/06/2014   Procedure: CARDIOVERSION;  Surgeon: [name], MD;  Location: [place];  Service: Cardiovascular;  Laterality: N/A;   CARDIOVERSION N/A 05/26/2017   Procedure: CARDIOVERSION;  Surgeon: [name], MD;  Location: [place];  Service: Cardiovascular;  Laterality: N/A;   CARDIOVERSION N/A 04/25/2019   Procedure: CARDIOVERSION;  Surgeon: Sanda Klein, MD;  Location: [place];  Service: Cardiovascular;  Laterality: N/A;   CARPAL TUNNEL RELEASE     CORONARY ANGIOPLASTY     multiple stents   CORONARY ARTERY BYPASS GRAFT  2006   EYE SURGERY     right eye-  muscular repair   TOTAL HIP ARTHROPLASTY  01/03/2012   Procedure: TOTAL HIP ARTHROPLASTY;   Surgeon: [name], MD;  [facility];  Service: Orthopedics;  Laterality: Right;    There were no vitals filed for this visit.   Subjective Assessment - 11/04/20 1431     Subjective "I am feeling good"    Currently in Pain? No/denies                               [facility] Adult PT Treatment/Exercise - 11/04/20 0001       Ambulation/Gait   Stairs Yes    Stairs Assistance 6: Modified independent (Device/Increase time)    Number of Stairs 48    Height of Stairs 6    Gait Comments Gait outside in parking lot around back building. 2standing rest breaks, very short of breath coming up the hill      Knee/Hip Exercises: Stretches   Passive Hamstring Stretch Both;3 reps;10 seconds;20 seconds      Knee/Hip Exercises: Aerobic   Recumbent Bike level 5 x 6 minutes, very short of breath      Knee/Hip Exercises: Machines for Strengthening   Cybex Knee Extension 15lb 2x10    Cybex Knee Flexion 35lb 2x15    Cybex Leg Press 50lb 2x15  PT Short Term Goals - 09/09/20 1641       PT SHORT TERM GOAL #1   Title independent with initial HEP    Status Achieved               PT Long Term Goals - 10/28/20 1611       PT LONG TERM GOAL #1   Title independent with an advanced HEP    Status Partially Met      PT LONG TERM GOAL #2   Title be able to cross the right leg over his left while sitting and put on sock and shoe    Status Partially Met      PT LONG TERM GOAL #3   Title report no difficulty walking to the mailbox and back    Status Achieved      PT LONG TERM GOAL #4   Title increase strength of the right hip to 4+/5    Status Achieved                   Plan - 11/04/20 1510     Clinical Impression Statement Pt continues to have bilateral calve and hamstring tightness. increase fatigue noted with all functional interventions. Added stair negotiation to today's session without reports of increase pain.  Decrease gait speed with uphill ambulation. He has yet signed up for the [facility].    Stability/Clinical Decision Making Stable/Uncomplicated    Rehab Potential Good    PT Frequency 2x / week    PT Duration 12 weeks    PT Treatment/Interventions ADLs/Self Care Home Management;Gait training;Stair training;Functional mobility training;Therapeutic activities;Therapeutic exercise;Balance training;Neuromuscular re-education;Manual techniques;Patient/family education    PT Next Visit Plan overall conditioning and flexibility, look at what he can and will do after PT d/c             Patient will benefit from skilled therapeutic intervention in order to improve the following deficits and impairments:  Abnormal gait, Decreased range of motion, Difficulty walking, Decreased activity tolerance, Impaired flexibility, Decreased strength, Decreased mobility  Visit Diagnosis: Difficulty in walking, not elsewhere classified  Stiffness of right hip, not elsewhere classified     Problem List Patient Active Problem List   Diagnosis Date Noted   Long term (current) use of anticoagulants 02/12/2019   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 [place], [place], [address] Phone: [phone]   Fax:  [phone]  Name: Gary [name] MRN: [id number] Date of Birth: July 28, 1945

## 2020-11-11 ENCOUNTER — Ambulatory Visit: Payer: Medicare Other | Admitting: Physical Therapy

## 2020-11-17 ENCOUNTER — Other Ambulatory Visit: Payer: Self-pay | Admitting: Cardiovascular Disease

## 2020-11-18 ENCOUNTER — Encounter: Payer: Self-pay | Admitting: Physical Therapy

## 2020-11-18 ENCOUNTER — Other Ambulatory Visit: Payer: Self-pay

## 2020-11-18 ENCOUNTER — Ambulatory Visit: Payer: Medicare Other | Admitting: Physical Therapy

## 2020-11-18 DIAGNOSIS — M25651 Stiffness of right hip, not elsewhere classified: Secondary | ICD-10-CM

## 2020-11-18 DIAGNOSIS — R262 Difficulty in walking, not elsewhere classified: Secondary | ICD-10-CM | POA: Diagnosis not present

## 2020-11-18 NOTE — Therapy (Signed)
[place]. [place], [place], [address] Phone: [phone]   Fax:  [phone]  Physical Therapy Treatment  Patient Details  Name: Gary [name] MRN: [id number] Date of Birth: 03-02-1945 Referring Provider (PT): Francesco Sor   Encounter Date: 11/18/2020   PT End of Session - 11/18/20 1437     Visit Number 13    Date for PT Re-Evaluation 12/03/20    Authorization Type [employer]    PT Start Time 1355    PT Stop Time 4097    PT Time Calculation (min) 43 min    Activity Tolerance Patient tolerated treatment well    Behavior During Therapy [facility] for tasks assessed/performed             Past Medical History:  Diagnosis Date   Aortic aneurysm ([place]) 02/20/2016   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   Arthritis    Atrial fibrillation ([place]) 11/13/2014   Cancer ([place])    skin   Chronic combined systolic and diastolic heart failure ([place]) 02/20/2016   LVEF 45-50% 11/2014   Coronary artery disease    Depression    Essential hypertension 11/13/2014   Hyperlipidemia    Hyperlipidemia 11/13/2014   Hypertension    Sleep apnea    CPAP 'Uses half the time"  last study 2009   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    Past Surgical History:  Procedure Laterality Date   CARDIOVERSION N/A 12/06/2014   Procedure: CARDIOVERSION;  Surgeon: [name], MD;  Location: [facility] [place];  Service: Cardiovascular;  Laterality: N/A;   CARDIOVERSION N/A 05/26/2017   Procedure: CARDIOVERSION;  Surgeon: [name], MD;  Location: [place];  Service: Cardiovascular;  Laterality: N/A;   CARDIOVERSION N/A 04/25/2019   Procedure: CARDIOVERSION;  Surgeon: Sanda Klein, MD;  Location: [place];  Service: Cardiovascular;  Laterality: N/A;   CARPAL TUNNEL RELEASE     CORONARY ANGIOPLASTY     multiple stents   CORONARY ARTERY BYPASS GRAFT  2006   EYE SURGERY     right eye-  muscular repair   TOTAL HIP ARTHROPLASTY  01/03/2012    Procedure: TOTAL HIP ARTHROPLASTY;  Surgeon: [name], MD;  [facility];  Service: Orthopedics;  Laterality: Right;    There were no vitals filed for this visit.   Subjective Assessment - 11/18/20 1402     Subjective I really think i am doing better, less SOB with getting the mail    Currently in Pain? No/denies                               [facility] Adult PT Treatment/Exercise - 11/18/20 0001       Knee/Hip Exercises: Stretches   Passive Hamstring Stretch Right;5 reps;20 seconds    Piriformis Stretch Right;Left;5 reps;20 seconds    Gastroc Stretch Right;Left;5 reps;20 seconds      Knee/Hip Exercises: Aerobic   Tread Mill 2 minutes 5% grade 1.8 mph    Recumbent Bike level 5 x 6 minutes, very short of breath      Knee/Hip Exercises: Machines for Strengthening   Cybex Knee Extension 15lb 2x10    Cybex Knee Flexion 35lb 2x15    Cybex Leg Press 50lb 2x15    Other Machine rows and lats 35#, chest press 15#                       PT Short Term Goals -  09/09/20 1641       PT SHORT TERM GOAL #1   Title independent with initial HEP    Status Achieved               PT Long Term Goals - 11/18/20 1438       PT LONG TERM GOAL #1   Title independent with an advanced HEP    Status Achieved      PT LONG TERM GOAL #2   Title be able to cross the right leg over his left while sitting and put on sock and shoe    Status Achieved      PT LONG TERM GOAL #3   Title report no difficulty walking to the mailbox and back    Status Achieved      PT LONG TERM GOAL #4   Title increase strength of the right hip to 4+/5    Status Achieved                   Plan - 11/18/20 1437     Clinical Impression Statement We went over gym routine, safety, reps, sets and weights, he reports understanding, we went over the stretches and how to do.  He reports feeling good about this    PT Next Visit Plan d/c with goals met    Consulted and  Agree with Plan of Care Patient             Patient will benefit from skilled therapeutic intervention in order to improve the following deficits and impairments:  Abnormal gait, Decreased range of motion, Difficulty walking, Decreased activity tolerance, Impaired flexibility, Decreased strength, Decreased mobility  Visit Diagnosis: Difficulty in walking, not elsewhere classified  Stiffness of right hip, not elsewhere classified     Problem List Patient Active Problem List   Diagnosis Date Noted   Long term (current) use of anticoagulants 02/12/2019   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 [place], [place], [address] Phone: [phone]   Fax:  [phone]  Name: Gary [name] MRN: [id number] Date of Birth: November 11, 1945

## 2020-12-16 DIAGNOSIS — E669 Obesity, unspecified: Secondary | ICD-10-CM | POA: Diagnosis not present

## 2020-12-16 DIAGNOSIS — Z6835 Body mass index (BMI) 35.0-35.9, adult: Secondary | ICD-10-CM | POA: Diagnosis not present

## 2020-12-16 DIAGNOSIS — M3130 Wegener's granulomatosis without renal involvement: Secondary | ICD-10-CM | POA: Diagnosis not present

## 2020-12-16 DIAGNOSIS — M150 Primary generalized (osteo)arthritis: Secondary | ICD-10-CM | POA: Diagnosis not present

## 2020-12-16 DIAGNOSIS — Z7952 Long term (current) use of systemic steroids: Secondary | ICD-10-CM | POA: Diagnosis not present

## 2020-12-16 DIAGNOSIS — R5383 Other fatigue: Secondary | ICD-10-CM | POA: Diagnosis not present

## 2020-12-16 DIAGNOSIS — M6281 Muscle weakness (generalized): Secondary | ICD-10-CM | POA: Diagnosis not present

## 2020-12-17 ENCOUNTER — Encounter: Payer: Self-pay | Admitting: Pharmacist Clinician (PhC)/ Clinical Pharmacy Specialist

## 2020-12-17 ENCOUNTER — Other Ambulatory Visit: Payer: Self-pay | Admitting: Cardiovascular Disease

## 2020-12-17 ENCOUNTER — Telehealth: Payer: Self-pay | Admitting: Cardiovascular Disease

## 2020-12-17 NOTE — Telephone Encounter (Signed)
Pt called in and would like to talk to [place] or [place] about renewing his repatha.    Best number [phone]

## 2020-12-17 NOTE — Telephone Encounter (Signed)
Rx request sent to pharmacy.  

## 2020-12-17 NOTE — Telephone Encounter (Signed)
Responded to via [place]

## 2020-12-30 ENCOUNTER — Other Ambulatory Visit: Payer: Self-pay

## 2020-12-30 ENCOUNTER — Ambulatory Visit (INDEPENDENT_AMBULATORY_CARE_PROVIDER_SITE_OTHER): Payer: Medicare Other | Admitting: Pharmacist

## 2020-12-30 DIAGNOSIS — Z7901 Long term (current) use of anticoagulants: Secondary | ICD-10-CM | POA: Diagnosis not present

## 2020-12-30 DIAGNOSIS — I480 Paroxysmal atrial fibrillation: Secondary | ICD-10-CM

## 2020-12-30 LAB — POCT INR: INR: 3 (ref 2.0–3.0)

## 2020-12-30 NOTE — Patient Instructions (Signed)
Description    Continue taking warfarin 5mg  daily and repeat INR in 8 weeks.   CHADS-VASc = 4   Please call us at [phone]

## 2021-02-01 DIAGNOSIS — E039 Hypothyroidism, unspecified: Secondary | ICD-10-CM | POA: Diagnosis not present

## 2021-02-01 DIAGNOSIS — I10 Essential (primary) hypertension: Secondary | ICD-10-CM | POA: Diagnosis not present

## 2021-02-01 DIAGNOSIS — I4891 Unspecified atrial fibrillation: Secondary | ICD-10-CM | POA: Diagnosis not present

## 2021-02-01 DIAGNOSIS — E785 Hyperlipidemia, unspecified: Secondary | ICD-10-CM | POA: Diagnosis not present

## 2021-02-06 DIAGNOSIS — I5022 Chronic systolic (congestive) heart failure: Secondary | ICD-10-CM | POA: Diagnosis not present

## 2021-02-06 DIAGNOSIS — D649 Anemia, unspecified: Secondary | ICD-10-CM | POA: Diagnosis not present

## 2021-02-06 DIAGNOSIS — D6869 Other thrombophilia: Secondary | ICD-10-CM | POA: Diagnosis not present

## 2021-02-06 DIAGNOSIS — M3130 Wegener's granulomatosis without renal involvement: Secondary | ICD-10-CM | POA: Diagnosis not present

## 2021-02-06 DIAGNOSIS — M25561 Pain in right knee: Secondary | ICD-10-CM | POA: Diagnosis not present

## 2021-02-06 DIAGNOSIS — Z7952 Long term (current) use of systemic steroids: Secondary | ICD-10-CM | POA: Diagnosis not present

## 2021-02-06 DIAGNOSIS — I4891 Unspecified atrial fibrillation: Secondary | ICD-10-CM | POA: Diagnosis not present

## 2021-02-06 DIAGNOSIS — I25729 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: Secondary | ICD-10-CM | POA: Diagnosis not present

## 2021-02-06 DIAGNOSIS — E1169 Type 2 diabetes mellitus with other specified complication: Secondary | ICD-10-CM | POA: Diagnosis not present

## 2021-02-06 DIAGNOSIS — E785 Hyperlipidemia, unspecified: Secondary | ICD-10-CM | POA: Diagnosis not present

## 2021-02-06 DIAGNOSIS — E291 Testicular hypofunction: Secondary | ICD-10-CM | POA: Diagnosis not present

## 2021-02-06 DIAGNOSIS — E039 Hypothyroidism, unspecified: Secondary | ICD-10-CM | POA: Diagnosis not present

## 2021-02-06 DIAGNOSIS — I10 Essential (primary) hypertension: Secondary | ICD-10-CM | POA: Diagnosis not present

## 2021-02-06 DIAGNOSIS — R7301 Impaired fasting glucose: Secondary | ICD-10-CM | POA: Diagnosis not present

## 2021-02-09 DIAGNOSIS — H5212 Myopia, left eye: Secondary | ICD-10-CM | POA: Diagnosis not present

## 2021-02-09 DIAGNOSIS — H1789 Other corneal scars and opacities: Secondary | ICD-10-CM | POA: Diagnosis not present

## 2021-02-09 DIAGNOSIS — H2512 Age-related nuclear cataract, left eye: Secondary | ICD-10-CM | POA: Diagnosis not present

## 2021-02-09 DIAGNOSIS — H40033 Anatomical narrow angle, bilateral: Secondary | ICD-10-CM | POA: Diagnosis not present

## 2021-02-10 ENCOUNTER — Ambulatory Visit (INDEPENDENT_AMBULATORY_CARE_PROVIDER_SITE_OTHER): Payer: Medicare Other | Admitting: *Deleted

## 2021-02-10 ENCOUNTER — Other Ambulatory Visit: Payer: Self-pay

## 2021-02-10 DIAGNOSIS — I480 Paroxysmal atrial fibrillation: Secondary | ICD-10-CM

## 2021-02-10 DIAGNOSIS — Z7901 Long term (current) use of anticoagulants: Secondary | ICD-10-CM

## 2021-02-10 DIAGNOSIS — Z5181 Encounter for therapeutic drug level monitoring: Secondary | ICD-10-CM | POA: Diagnosis not present

## 2021-02-10 LAB — POCT INR: INR: 3.2 — AB (ref 2.0–3.0)

## 2021-02-10 NOTE — Patient Instructions (Signed)
Description    Hold warfarin today and then continue taking warfarin 5mg  daily and repeat INR in 4 weeks.   CHADS-VASc = 4   Please call us at [phone]

## 2021-02-14 ENCOUNTER — Other Ambulatory Visit: Payer: Self-pay | Admitting: Cardiovascular Disease

## 2021-02-16 DIAGNOSIS — M549 Dorsalgia, unspecified: Secondary | ICD-10-CM | POA: Diagnosis not present

## 2021-03-04 DIAGNOSIS — D1801 Hemangioma of skin and subcutaneous tissue: Secondary | ICD-10-CM | POA: Diagnosis not present

## 2021-03-04 DIAGNOSIS — L821 Other seborrheic keratosis: Secondary | ICD-10-CM | POA: Diagnosis not present

## 2021-03-04 DIAGNOSIS — Z85828 Personal history of other malignant neoplasm of skin: Secondary | ICD-10-CM | POA: Diagnosis not present

## 2021-03-04 DIAGNOSIS — Z85820 Personal history of malignant melanoma of skin: Secondary | ICD-10-CM | POA: Diagnosis not present

## 2021-03-05 ENCOUNTER — Encounter (HOSPITAL_BASED_OUTPATIENT_CLINIC_OR_DEPARTMENT_OTHER): Payer: Self-pay | Admitting: Cardiovascular Disease

## 2021-03-05 NOTE — Telephone Encounter (Signed)
Warfarin refill please  

## 2021-03-10 ENCOUNTER — Other Ambulatory Visit: Payer: Self-pay

## 2021-03-10 ENCOUNTER — Ambulatory Visit (INDEPENDENT_AMBULATORY_CARE_PROVIDER_SITE_OTHER): Payer: Medicare Other | Admitting: Pharmacist

## 2021-03-10 DIAGNOSIS — E870 Hyperosmolality and hypernatremia: Secondary | ICD-10-CM | POA: Insufficient documentation

## 2021-03-10 DIAGNOSIS — Z7901 Long term (current) use of anticoagulants: Secondary | ICD-10-CM

## 2021-03-10 DIAGNOSIS — I480 Paroxysmal atrial fibrillation: Secondary | ICD-10-CM | POA: Diagnosis not present

## 2021-03-10 LAB — POCT INR: INR: 1 — AB (ref 2.0–3.0)

## 2021-03-10 MED ORDER — WARFARIN SODIUM 5 MG PO TABS: 5.0000 mg | ORAL_TABLET | Freq: Every day | ORAL | 0 refills | Status: DC

## 2021-03-10 NOTE — Patient Instructions (Addendum)
Description   ? Patient has not had warfarin for 8 days.  Sent in refill.  Instructed patient to take '10mg'$  today and tomorrow and then take 1 tablet once a day.  Recheck in 1 week. Please call us at [phone] with any questions. ?  ? ? ?

## 2021-03-10 NOTE — Progress Notes (Signed)
Patient has missed 8 doses of warfarin.  Has instead been taking '325mg'$  of aspirin.  Did not realize this would not be as effective ?

## 2021-03-12 DIAGNOSIS — Z79899 Other long term (current) drug therapy: Secondary | ICD-10-CM | POA: Diagnosis not present

## 2021-03-12 DIAGNOSIS — M3130 Wegener's granulomatosis without renal involvement: Secondary | ICD-10-CM | POA: Diagnosis not present

## 2021-03-18 ENCOUNTER — Ambulatory Visit (INDEPENDENT_AMBULATORY_CARE_PROVIDER_SITE_OTHER): Payer: Medicare Other

## 2021-03-18 ENCOUNTER — Other Ambulatory Visit: Payer: Self-pay

## 2021-03-18 DIAGNOSIS — Z7901 Long term (current) use of anticoagulants: Secondary | ICD-10-CM

## 2021-03-18 DIAGNOSIS — I480 Paroxysmal atrial fibrillation: Secondary | ICD-10-CM

## 2021-03-18 LAB — POCT INR: INR: 2.4 (ref 2.0–3.0)

## 2021-03-18 NOTE — Patient Instructions (Signed)
Continue taking 1 tablet Daily.  Recheck in 6 weeks. Please call us at [phone] with any questions. ?

## 2021-04-17 ENCOUNTER — Encounter: Payer: Self-pay | Admitting: Physical Therapy

## 2021-04-17 NOTE — Therapy (Signed)
[place] ?[address] ?[address] ?[place], [place], [address] ?Phone: [phone]   Fax:  [phone] ? ?Patient Details  ?Name: Gary [name] ?MRN: [id number] ?Date of Birth: 1945/04/21 ?Referring Provider:  No ref. provider found ? ?Encounter Date: 04/17/2021 ? ?PHYSICAL THERAPY DISCHARGE SUMMARY ? ?Visits from Start of Care: 13 ? ?Current functional level related to goals / functional outcomes: ?DCed in November  ?  ?Remaining deficits: ?None  ?  ?Education / Equipment: ?DC   ? ?Patient agrees to discharge. Patient goals were met. Patient is being discharged due to meeting the stated rehab goals. ? ? ?Phenix Grein U PT, DPT, PN2  ? ?Supplemental Physical Therapist ?[place]  ? ? ? ? ? ?[place] ?[address] ?[address] ?[place], [place], [address] ?Phone: [phone]   Fax:  [phone] ?

## 2021-04-26 ENCOUNTER — Other Ambulatory Visit: Payer: Self-pay | Admitting: Cardiovascular Disease

## 2021-04-30 ENCOUNTER — Ambulatory Visit (INDEPENDENT_AMBULATORY_CARE_PROVIDER_SITE_OTHER): Payer: Medicare Other

## 2021-04-30 DIAGNOSIS — Z7901 Long term (current) use of anticoagulants: Secondary | ICD-10-CM | POA: Diagnosis not present

## 2021-04-30 DIAGNOSIS — I480 Paroxysmal atrial fibrillation: Secondary | ICD-10-CM

## 2021-04-30 LAB — POCT INR: INR: 4.2 — AB (ref 2.0–3.0)

## 2021-04-30 NOTE — Patient Instructions (Signed)
HOLD Friday DOSE ONLY and then Continue taking 1 tablet Daily.  Recheck in 3 weeks. Please call us at [phone] with any questions.  Eat greens tonight ?

## 2021-05-03 DIAGNOSIS — E785 Hyperlipidemia, unspecified: Secondary | ICD-10-CM | POA: Diagnosis not present

## 2021-05-03 DIAGNOSIS — I4891 Unspecified atrial fibrillation: Secondary | ICD-10-CM | POA: Diagnosis not present

## 2021-05-03 DIAGNOSIS — I10 Essential (primary) hypertension: Secondary | ICD-10-CM | POA: Diagnosis not present

## 2021-05-03 DIAGNOSIS — E039 Hypothyroidism, unspecified: Secondary | ICD-10-CM | POA: Diagnosis not present

## 2021-05-21 ENCOUNTER — Ambulatory Visit (INDEPENDENT_AMBULATORY_CARE_PROVIDER_SITE_OTHER): Payer: Medicare Other

## 2021-05-21 DIAGNOSIS — Z7901 Long term (current) use of anticoagulants: Secondary | ICD-10-CM | POA: Diagnosis not present

## 2021-05-21 DIAGNOSIS — I480 Paroxysmal atrial fibrillation: Secondary | ICD-10-CM | POA: Diagnosis not present

## 2021-05-21 DIAGNOSIS — M25561 Pain in right knee: Secondary | ICD-10-CM | POA: Diagnosis not present

## 2021-05-21 LAB — POCT INR: INR: 4.6 — AB (ref 2.0–3.0)

## 2021-05-21 NOTE — Patient Instructions (Signed)
HOLD Friday and SATURDAY DOSE ONLY and then DECREASE TO 1 tablet Daily, EXCEPT 0.5 TABLET Monday and Friday.   Recheck in 2 weeks. Please call us at [phone] with any questions.  Eat greens tonight

## 2021-05-27 ENCOUNTER — Other Ambulatory Visit: Payer: Self-pay

## 2021-05-27 DIAGNOSIS — I480 Paroxysmal atrial fibrillation: Secondary | ICD-10-CM

## 2021-05-27 DIAGNOSIS — Z7901 Long term (current) use of anticoagulants: Secondary | ICD-10-CM

## 2021-05-27 MED ORDER — WARFARIN SODIUM 5 MG PO TABS: 5.0000 mg | ORAL_TABLET | Freq: Every day | ORAL | 0 refills | Status: DC

## 2021-05-28 DIAGNOSIS — M25561 Pain in right knee: Secondary | ICD-10-CM | POA: Diagnosis not present

## 2021-06-03 DIAGNOSIS — I10 Essential (primary) hypertension: Secondary | ICD-10-CM | POA: Diagnosis not present

## 2021-06-03 DIAGNOSIS — I5022 Chronic systolic (congestive) heart failure: Secondary | ICD-10-CM | POA: Diagnosis not present

## 2021-06-03 DIAGNOSIS — E1169 Type 2 diabetes mellitus with other specified complication: Secondary | ICD-10-CM | POA: Diagnosis not present

## 2021-06-04 ENCOUNTER — Ambulatory Visit (INDEPENDENT_AMBULATORY_CARE_PROVIDER_SITE_OTHER): Payer: Medicare Other

## 2021-06-04 DIAGNOSIS — Z7901 Long term (current) use of anticoagulants: Secondary | ICD-10-CM | POA: Diagnosis not present

## 2021-06-04 DIAGNOSIS — I480 Paroxysmal atrial fibrillation: Secondary | ICD-10-CM | POA: Diagnosis not present

## 2021-06-04 LAB — POCT INR: INR: 2.5 (ref 2.0–3.0)

## 2021-06-04 NOTE — Patient Instructions (Signed)
Continue 1 tablet Daily, EXCEPT 0.5 TABLET Monday and Friday.   Recheck in 6 weeks. Please call us at [phone] with any questions.

## 2021-06-08 DIAGNOSIS — M25561 Pain in right knee: Secondary | ICD-10-CM | POA: Diagnosis not present

## 2021-06-16 ENCOUNTER — Other Ambulatory Visit: Payer: Self-pay | Admitting: Cardiovascular Disease

## 2021-06-16 DIAGNOSIS — Z7901 Long term (current) use of anticoagulants: Secondary | ICD-10-CM

## 2021-06-16 DIAGNOSIS — M3130 Wegener's granulomatosis without renal involvement: Secondary | ICD-10-CM | POA: Diagnosis not present

## 2021-06-16 DIAGNOSIS — M1991 Primary osteoarthritis, unspecified site: Secondary | ICD-10-CM | POA: Diagnosis not present

## 2021-06-16 DIAGNOSIS — E669 Obesity, unspecified: Secondary | ICD-10-CM | POA: Diagnosis not present

## 2021-06-16 DIAGNOSIS — I480 Paroxysmal atrial fibrillation: Secondary | ICD-10-CM

## 2021-06-16 DIAGNOSIS — Z7952 Long term (current) use of systemic steroids: Secondary | ICD-10-CM | POA: Diagnosis not present

## 2021-06-16 DIAGNOSIS — Z6835 Body mass index (BMI) 35.0-35.9, adult: Secondary | ICD-10-CM | POA: Diagnosis not present

## 2021-06-16 DIAGNOSIS — R5383 Other fatigue: Secondary | ICD-10-CM | POA: Diagnosis not present

## 2021-06-16 DIAGNOSIS — M6281 Muscle weakness (generalized): Secondary | ICD-10-CM | POA: Diagnosis not present

## 2021-06-16 NOTE — Telephone Encounter (Signed)
Please review for refill. Thank you! 

## 2021-07-01 ENCOUNTER — Other Ambulatory Visit: Payer: Self-pay | Admitting: Cardiovascular Disease

## 2021-07-01 NOTE — Telephone Encounter (Signed)
Rx(s) sent to pharmacy electronically.  

## 2021-07-03 DIAGNOSIS — I10 Essential (primary) hypertension: Secondary | ICD-10-CM | POA: Diagnosis not present

## 2021-07-03 DIAGNOSIS — E1169 Type 2 diabetes mellitus with other specified complication: Secondary | ICD-10-CM | POA: Diagnosis not present

## 2021-07-03 DIAGNOSIS — I5022 Chronic systolic (congestive) heart failure: Secondary | ICD-10-CM | POA: Diagnosis not present

## 2021-07-16 ENCOUNTER — Ambulatory Visit (INDEPENDENT_AMBULATORY_CARE_PROVIDER_SITE_OTHER): Payer: Medicare Other | Admitting: *Deleted

## 2021-07-16 DIAGNOSIS — I480 Paroxysmal atrial fibrillation: Secondary | ICD-10-CM

## 2021-07-16 DIAGNOSIS — Z7901 Long term (current) use of anticoagulants: Secondary | ICD-10-CM

## 2021-07-16 DIAGNOSIS — Z5181 Encounter for therapeutic drug level monitoring: Secondary | ICD-10-CM | POA: Diagnosis not present

## 2021-07-16 LAB — POCT INR: INR: 2.5 (ref 2.0–3.0)

## 2021-07-16 NOTE — Patient Instructions (Signed)
Description   Continue 1 tablet Daily, EXCEPT 1/2 TABLET Monday and Friday.   Recheck in 6 weeks. Please call us at [phone] with any questions.

## 2021-08-11 DIAGNOSIS — Z125 Encounter for screening for malignant neoplasm of prostate: Secondary | ICD-10-CM | POA: Diagnosis not present

## 2021-08-11 DIAGNOSIS — E291 Testicular hypofunction: Secondary | ICD-10-CM | POA: Diagnosis not present

## 2021-08-11 DIAGNOSIS — E039 Hypothyroidism, unspecified: Secondary | ICD-10-CM | POA: Diagnosis not present

## 2021-08-11 DIAGNOSIS — I10 Essential (primary) hypertension: Secondary | ICD-10-CM | POA: Diagnosis not present

## 2021-08-11 DIAGNOSIS — E785 Hyperlipidemia, unspecified: Secondary | ICD-10-CM | POA: Diagnosis not present

## 2021-08-11 DIAGNOSIS — R7301 Impaired fasting glucose: Secondary | ICD-10-CM | POA: Diagnosis not present

## 2021-08-11 DIAGNOSIS — R7989 Other specified abnormal findings of blood chemistry: Secondary | ICD-10-CM | POA: Diagnosis not present

## 2021-08-18 DIAGNOSIS — E1169 Type 2 diabetes mellitus with other specified complication: Secondary | ICD-10-CM | POA: Diagnosis not present

## 2021-08-18 DIAGNOSIS — E611 Iron deficiency: Secondary | ICD-10-CM | POA: Diagnosis not present

## 2021-08-18 DIAGNOSIS — Z7952 Long term (current) use of systemic steroids: Secondary | ICD-10-CM | POA: Diagnosis not present

## 2021-08-18 DIAGNOSIS — R82998 Other abnormal findings in urine: Secondary | ICD-10-CM | POA: Diagnosis not present

## 2021-08-18 DIAGNOSIS — I4891 Unspecified atrial fibrillation: Secondary | ICD-10-CM | POA: Diagnosis not present

## 2021-08-18 DIAGNOSIS — I739 Peripheral vascular disease, unspecified: Secondary | ICD-10-CM | POA: Diagnosis not present

## 2021-08-18 DIAGNOSIS — D6869 Other thrombophilia: Secondary | ICD-10-CM | POA: Diagnosis not present

## 2021-08-18 DIAGNOSIS — I10 Essential (primary) hypertension: Secondary | ICD-10-CM | POA: Diagnosis not present

## 2021-08-18 DIAGNOSIS — M3130 Wegener's granulomatosis without renal involvement: Secondary | ICD-10-CM | POA: Diagnosis not present

## 2021-08-18 DIAGNOSIS — M25551 Pain in right hip: Secondary | ICD-10-CM | POA: Diagnosis not present

## 2021-08-18 DIAGNOSIS — Z1389 Encounter for screening for other disorder: Secondary | ICD-10-CM | POA: Diagnosis not present

## 2021-08-18 DIAGNOSIS — Z0000 Encounter for general adult medical examination without abnormal findings: Secondary | ICD-10-CM | POA: Diagnosis not present

## 2021-08-18 DIAGNOSIS — Z1331 Encounter for screening for depression: Secondary | ICD-10-CM | POA: Diagnosis not present

## 2021-08-18 DIAGNOSIS — E785 Hyperlipidemia, unspecified: Secondary | ICD-10-CM | POA: Diagnosis not present

## 2021-08-18 DIAGNOSIS — I719 Aortic aneurysm of unspecified site, without rupture: Secondary | ICD-10-CM | POA: Diagnosis not present

## 2021-08-18 DIAGNOSIS — E039 Hypothyroidism, unspecified: Secondary | ICD-10-CM | POA: Diagnosis not present

## 2021-08-26 NOTE — Telephone Encounter (Signed)
Concerns previously addressed. Message sent to scheduling team to schedule 1 year f/u.  Loel Dubonnet, NP

## 2021-08-27 ENCOUNTER — Ambulatory Visit (INDEPENDENT_AMBULATORY_CARE_PROVIDER_SITE_OTHER): Payer: Medicare Other

## 2021-08-27 DIAGNOSIS — Z7901 Long term (current) use of anticoagulants: Secondary | ICD-10-CM | POA: Diagnosis not present

## 2021-08-27 DIAGNOSIS — I480 Paroxysmal atrial fibrillation: Secondary | ICD-10-CM | POA: Diagnosis not present

## 2021-08-27 LAB — POCT INR: INR: 1.7 — AB (ref 2.0–3.0)

## 2021-08-27 NOTE — Patient Instructions (Signed)
TAKE ANOTHER 1 TABLET TODAY ONLY and then Continue 1 tablet Daily, EXCEPT 1/2 TABLET Monday and Friday.   Recheck in 4 weeks. Please call us at [phone] with any questions.

## 2021-08-28 ENCOUNTER — Encounter (HOSPITAL_BASED_OUTPATIENT_CLINIC_OR_DEPARTMENT_OTHER): Payer: Self-pay

## 2021-09-09 DIAGNOSIS — L812 Freckles: Secondary | ICD-10-CM | POA: Diagnosis not present

## 2021-09-09 DIAGNOSIS — D2261 Melanocytic nevi of right upper limb, including shoulder: Secondary | ICD-10-CM | POA: Diagnosis not present

## 2021-09-09 DIAGNOSIS — L821 Other seborrheic keratosis: Secondary | ICD-10-CM | POA: Diagnosis not present

## 2021-09-09 DIAGNOSIS — D1801 Hemangioma of skin and subcutaneous tissue: Secondary | ICD-10-CM | POA: Diagnosis not present

## 2021-09-09 DIAGNOSIS — Z85828 Personal history of other malignant neoplasm of skin: Secondary | ICD-10-CM | POA: Diagnosis not present

## 2021-09-09 DIAGNOSIS — L723 Sebaceous cyst: Secondary | ICD-10-CM | POA: Diagnosis not present

## 2021-09-09 DIAGNOSIS — Z85820 Personal history of malignant melanoma of skin: Secondary | ICD-10-CM | POA: Diagnosis not present

## 2021-09-10 DIAGNOSIS — M3130 Wegener's granulomatosis without renal involvement: Secondary | ICD-10-CM | POA: Diagnosis not present

## 2021-09-10 DIAGNOSIS — R5383 Other fatigue: Secondary | ICD-10-CM | POA: Diagnosis not present

## 2021-09-10 DIAGNOSIS — Z111 Encounter for screening for respiratory tuberculosis: Secondary | ICD-10-CM | POA: Diagnosis not present

## 2021-09-10 NOTE — Progress Notes (Signed)
Cardiology Office Note:    Date:  09/11/2021   ID:  Gary [name], DOB [birth year], MRN [id number]  PCP:  [name], [place] Prov[facility] Cardiologist:  [name], MD { Click to update primary MD,subspecialty MD or APP then REFRESH:1}    Referring MD: [name], DO   CC: Here for 1 year follow-up  History of Present Illness:    Gary [name] is a 76 y.o. 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 Melanoma Mild ascending aortic aneurysm - 4.0 cm OSA, noncompliant with CPAP Wegener's disease  He was diagnosed with A-fib in 2016, underwent successful cardioversion that year.  2D echocardiogram revealed LVEF 45 to 50%, inferolateral hypokinesis, noted to have an ascending aortic aneurysm-4.0 cm.  Repeat echocardiogram in 2018 revealed similar findings.  Myoview 2016 showed fixed, small inferolateral defect. Carotid Doppler in 2017 revealed 1 to 39% ICA stenosis bilaterally.  Normal ABIs in 2021, PAD noted along distal to mid ATA on right side, with minimal plaque  bilaterally. Has been on warfarin for atrial fibrillation due to cost.   Last seen by Dr. Oval Linsey 1 year ago.  Was doing well from a cardiac perspective.  2D echocardiogram revealed ascending aortic diameter improvement to 3.8 cm, mild dilation noted. Today he presents for his 1 year follow-up appointment. He states he is doing well from a cardiac perspective and however he said last night he felt a dull ache on the left side of his chest that lasted for about 2 hours, associates this with indigestion.  Admits to recently eating spicy/fried foods at 10:00 last night and immediately went to bed and notices with laying down.  Continues to follow-up with rheumatology for his Wegener's disease.  Recently was at PCP office and had blood work done.  Does not wear CPAP at all for his OSA d/t his sinus issues.  Said  last sleep study was around 14 years ago.  Denies any shortness of breath, palpitations, significant weight changes, dizziness, orthopnea, PND, any acute bleeding, or claudication.  Going to be starting a his Ozempic soon and knows this will help him lose weight.  Past Medical History:  Diagnosis Date  . Aortic aneurysm ([place]) 02/20/2016   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  . Arthritis   . Atrial fibrillation ([place]) 11/13/2014  . Cancer ([place])    skin  . Chronic combined systolic and diastolic heart failure ([place]) 02/20/2016   LVEF 45-50% 11/2014  . Coronary artery disease   . Depression   . Essential hypertension 11/13/2014  . Hyperlipidemia   . Hyperlipidemia 11/13/2014  . Hypertension   . Sleep apnea    CPAP 'Uses half the time"  last study 2009  . 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 CARDIOVERSION N/A 12/06/2014   Procedure: CARDIOVERSION;  Surgeon: [name], MD;  Location: [facility];  Service: Cardiovascular;  Laterality: N/A;  . CARDIOVERSION N/A 05/26/2017   Procedure: CARDIOVERSION;  Surgeon: [name], MD;  Location: [facility] ENDOSCOPY;  Service: Cardiovascular;  Laterality: N/A;  . CARDIOVERSION N/A 04/25/2019   Procedure: CARDIOVERSION;  Surgeon: Sanda Klein, MD;  Location: [facility] ENDOSCOPY;  Service: Cardiovascular;  Laterality: N/A;  . CARPAL TUNNEL RELEASE    . CORONARY ANGIOPLASTY     multiple stents  . CORONARY ARTERY BYPASS GRAFT  2006  . EYE SURGERY  right eye-  muscular repair  . TOTAL HIP ARTHROPLASTY  01/03/2012   Procedure: TOTAL HIP ARTHROPLASTY;  Surgeon: [name], MD;  Location: [facility] ORS;  Service: Orthopedics;  Laterality: Right;    Current Medications: Current Meds  Medication Sig  . amLODipine (NORVASC) 2.5 MG tablet TAKE 1 TABLET BY MOUTH EVERY DAY  . calcium carbonate (OS-CAL) 600 MG TABS tablet Take 600 mg by mouth daily with breakfast.  . carvedilol (COREG) 25 MG tablet TAKE  1 TABLET BY MOUTH TWICE A DAY  . cetirizine (ZYRTEC) 10 MG tablet Take 10 mg by mouth daily.  . Cholecalciferol (VITAMIN D3) 125 MCG (5000 UT) TABS Take 5,000 Units by mouth daily.  . Coenzyme Q10 (CO Q 10) 100 MG CAPS Take 300 mg by mouth daily.   . hydrochlorothiazide (HYDRODIURIL) 12.5 MG tablet TAKE 1 TABLET BY MOUTH EVERY DAY  . levothyroxine (SYNTHROID, LEVOTHROID) 137 MCG tablet Take 137-205.5 mcg by mouth See admin instructions. Take 205.5 mcg by mouth on Wednesday.  Take 137 mcg by mouth daily on all other days  . losartan (COZAAR) 100 MG tablet TAKE 1 TABLET BY MOUTH EVERY DAY  . metFORMIN (GLUCOPHAGE) 500 MG tablet Take 500 mg by mouth 2 (two) times daily.  . Multiple Vitamin (MULTIVITAMIN WITH MINERALS) TABS tablet Take 1 tablet by mouth daily. Centrum silver  . predniSONE (DELTASONE) 5 MG tablet Take 5 mg by mouth as needed (for wagoners).  Marland Kitchen REPATHA SURECLICK 433 MG/ML SOAJ INJECT 140 MG INTO THE SKIN EVERY 14 (FOURTEEN) DAYS. INJECT 1 PEN INTO THE FATTY TISSUE SKIN EVERY 14 DAY  . riTUXimab (RITUXAN IV) Inject 1 application into the vein every 6 (six) months.   . sodium chloride (OCEAN) 0.65 % SOLN nasal spray Place 1 spray into both nostrils daily as needed for congestion.   . sulfamethoxazole-trimethoprim (BACTRIM) 400-80 MG tablet Take 1 tablet by mouth daily.  Marland Kitchen testosterone cypionate (DEPOTESTOSTERONE CYPIONATE) 200 MG/ML injection Inject 100 mg into the muscle every 14 (fourteen) days.  Marland Kitchen venlafaxine XR (EFFEXOR-XR) 150 MG 24 hr capsule Take 150 mg by mouth daily.   Marland Kitchen warfarin (COUMADIN) 5 MG tablet Take 1/2 to 1 tablet by mouth every day or as directed by Coumadin Clinic     Allergies:   Statins   Social History   Socioeconomic History  . Marital status: Married    Spouse name: Not on file  . Number of children: Not on file  . Years of education: Not on file  . Highest education level: Not on file  Occupational History  . Not on file  Tobacco Use  . Smoking  status: Former    Types: Cigarettes    Quit date: 12/24/1983    Years since quitting: 37.7  . Smokeless tobacco: Never  Vaping Use  . Vaping Use: Never used  Substance and Sexual Activity  . Alcohol use: No    Alcohol/week: 0.0 standard drinks of alcohol  . Drug use: No  . Sexual activity: Not on file  Other Topics Concern  . Not on file  Social History Narrative  . Not on file   Social Determinants of Health   Financial Resource Strain: Not on file  Food Insecurity: Not on file  Transportation Needs: Not on file  Physical Activity: Not on file  Stress: Not on file  Social Connections: Not on file     Family History: The patient's family history includes Heart attack in his maternal grandfather, mother, paternal grandfather, and  paternal grandmother; Other in his father; Stroke in his maternal grandmother.  ROS:   Review of Systems  Constitutional: Negative.   HENT: Negative.    Eyes: Negative.   Respiratory: Negative.    Cardiovascular: Negative.   Gastrointestinal: Negative.   Genitourinary: Negative.   Musculoskeletal:  Positive for joint pain. Negative for back pain, falls, myalgias and neck pain.  Skin: Negative.   Neurological: Negative.   Endo/Heme/Allergies: Negative.   Psychiatric/Behavioral: Negative.      Please see the history of present illness.    All other systems reviewed and are negative.  EKGs/Labs/Other Studies Reviewed:    The following studies were reviewed today:   EKG:  EKG is ordered today.  The ekg ordered today demonstrates sinus rhythm, 71 bpm, LVH, several PACs otherwise nothing acute.  Bilateral carotid duplex on September 19, 2020: Summary:  Right Carotid: Velocities in the right ICA are consistent with a 1-39%  stenosis.   Left Carotid: Velocities in the left ICA are consistent with a 1-39%  stenosis.   Vertebrals: Bilateral vertebral arteries demonstrate antegrade flow.  Subclavians: Normal flow hemodynamics were seen in  bilateral subclavian               arteries.   2D echocardiogram on September 19, 2020:  1. Left ventricular ejection fraction, by estimation, is 50 to 55%. The  left ventricle has low normal function. The left ventricle demonstrates  regional wall motion abnormalities (see scoring diagram/findings for  description). There is mild asymmetric  left ventricular hypertrophy of the basal and septal segments. Left  ventricular diastolic parameters were normal.   2. Right ventricular systolic function is normal. The right ventricular  size is normal.   3. Left atrial size was moderately dilated.   4. The mitral valve is degenerative. Trivial mitral valve regurgitation.  No evidence of mitral stenosis. Severe mitral annular calcification.   5. The aortic valve is tricuspid. Aortic valve regurgitation is not  visualized. Mild to moderate aortic valve sclerosis/calcification is  present, without any evidence of aortic stenosis.   6. The inferior vena cava is normal in size with greater than 50%  respiratory variability, suggesting right atrial pressure of 3 mmHg.   Comparison(s): There was moderate concentric hypertrophy. Systolic  function was mildly to moderately reduced. The estimated ejection fraction  was in the range of 40% to 45%. Diffuse hypokinesis.    Vascular ultrasound lower extremity arterial study on October 08, 2019: Right: Minimum heterogenous plaque throughout the CFA. DFA, SFA. popliteal  artery, TPT, PTA and peroneal artery.  Two vessel run-off via PTA and peroneal artery; occlusive mid ATA and  75-99% stenosis in the distal ATA by velocity ratio.   Left: Minimum heterogenous plaque throughout the CFA. DFA, SFA. popliteal  artery, TPT, PTA and peroneal artery.  Two vessel run-off via PTA and peroneal artery; occlusive mid ATA.  Normal ABIs.  Bilateral carotid duplex on April 27, 2019: 1 to 39% ICA stenosis with normal flow hemodynamics in bilateral subclavian arteries  and antegrade flow noted in vertebral arteries bilaterally.   Myoview November 27, 2014: There was no ST segment deviation noted during stress. No T wave inversion was noted during stress. Defect 1: There is a small defect of moderate severity.   Small size, moderate-intensity fixed inferolateral wall defect - could represent artifact or scar. The study was non-gated, therefore LVEF was not calculated and wall motion abnormalities could not be determined. Based on the lack of significant reversible ischemia,  this is a low risk study.   Recent Labs: No results found for requested labs within last 365 days.  Recent Lipid Panel    Component Value Date/Time   CHOL 155 05/26/2020 0807   TRIG 184 (H) 05/26/2020 0807   HDL 45 05/26/2020 0807   CHOLHDL 3.4 05/26/2020 0807   CHOLHDL 5.4 (H) 02/19/2015 0821   VLDL 22 02/19/2015 0821   LDLCALC 79 05/26/2020 0807     Risk Assessment/Calculations:    CHA2DS2-VASc Score = 6  This indicates a 9.7% annual risk of stroke. The patient's score is based upon: CHF History: 1 HTN History: 1 Diabetes History: 1 Stroke History: 0 Vascular Disease History: 1 Age Score: 2 Gender Score: 0      STOP-BANG score: 6  Physical Exam:    VS:  BP 130/76   Pulse 71   Ht '5\' 10"'$  (1.778 m)   Wt 251 lb (113.9 kg)   BMI 36.01 kg/m     Wt Readings from Last 3 Encounters:  09/11/21 251 lb (113.9 kg)  09/12/20 245 lb 3.2 oz (111.2 kg)  04/01/20 232 lb (105.2 kg)     GEN: Overweight, 76 y.o. Caucasian male in no acute distress HEENT: Normal NECK: No JVD; No carotid bruits CARDIAC: S1.S2, RRR, no murmurs, rubs, gallops; 2+ peripheral pulses throughout, strong and equal bilaterally RESPIRATORY:  Clear and diminished to auscultation without rales, wheezing or rhonchi  ABDOMEN: Soft, rounded, bowel sounds x4 MUSCULOSKELETAL:  No edema; No deformity  SKIN: Warm and dry NEUROLOGIC:  Alert and oriented x 3 PSYCHIATRIC:  Normal affect   ASSESSMENT:     1. Chronic combined systolic and diastolic heart failure ([place])   2. Coronary artery disease involving native heart without angina pectoris, unspecified vessel or lesion type   3. Hyperlipidemia, unspecified hyperlipidemia type   4. Bilateral carotid artery stenosis   5. PAD (peripheral artery disease) ([place])   6. Sleep apnea, unspecified type   7. Paroxysmal atrial fibrillation (H[facility])   8. Hypertension, unspecified type   9. Aortic dilatation ([facility])   10. Granulomatosis with polyangiitis, unspecified whether renal involvement ([place])    PLAN:    In order of problems listed above:  Chronic combined systolic and diastolic heart failure- chronic, stable 2D echo in 2022 showed LVEF 50 to 55%. Euvolemic and well compensated on exam. Low sodium diet, fluid restriction <2L, and daily weights encouraged. Educated to contact our office for weight gain of 2 lbs overnight or 5 lbs in one week.  Continue current medication regimen.   2. CAD, hyperlipidemia, bilateral carotid artery stenosis, PAD - chronic, stable Recent indigestion symptoms last night.  Discussed eating hygiene habits to prevent indigestion.  Defers ischemic evaluation at this time.  Just had labs done at PCP, have requested his office send [place] over lab results.  Carotid duplex revealed 1 to 39% stenosis bilaterally.  Defers repeat carotid study at this time.  No claudication symptoms.  Continue carvedilol, coenzyme every 10, and Repatha. ED precautions discussed.  3. Obstructive sleep apnea - chronic, not compliant with CPAP Not using CPAP at all.  Stop bang score 6.  Agreeable to repeat sleep study.  Arrange an Itamar sleep study today.  4. Paroxysmal A-fib, long term anticoagulation - chronic stable Denies tachycardia or palpitations.  Twelve-lead EKG shows sinus rhythm.  Currently on warfarin-continue to follow-up with Coumadin clinic.  Continue current medication regimen.  4. HTN - chronic, stable Blood pressure today 130/76,  well-controlled readings at home.  PCP will fax over lab results.  Discussed to monitor BP at home at least 2 hours after medications and sitting for 5-10 minutes. Continue current medication regimen.  5. Aortic dilatation - resolved Previous study showed ascending aortic aneurysm at 4.0 cm.  2D echocardiogram in September 2022 revealed normal size and structure of aortic root.  We will continue to monitor.   6. Granulomatosis with polyangiitis Continue current medication therapy and continue to follow-up with rheumatology.    7. Disposition: Follow-up with Dr. Oval Linsey in 1 year or sooner if anything changes.    Medication Adjustments/Labs and Tests Ordered: Current medicines are reviewed at length with the patient today.  Concerns regarding medicines are outlined above.  Orders Placed This Encounter  Procedures  . EKG 12-Lead  . Itamar Sleep Study   No orders of the defined types were placed in this encounter.   Patient Instructions  Medication Instructions:  Your Physician recommend you continue on your current medication as directed.    *If you need a refill on your cardiac medications before your next appointment, please call your pharmacy*  Testing/Procedures: WatchPAT?  Is a FDA cleared portable home sleep study test that uses a watch and 3 points of contact to monitor 7 different channels, including your heart rate, oxygen saturations, body position, snoring, and chest motion.  The study is easy to use from the comfort of your own home and accurately detect sleep apnea.  Before bed, you attach the chest sensor, attached the sleep apnea bracelet to your nondominant hand, and attach the finger probe.  After the study, the raw data is downloaded from the watch and scored for apnea events.   For more information: [email or web address]  Patient Testing Instructions:  Do not put battery into the device until bedtime when you are ready to begin the test. Please  call the support number if you need assistance after following the instructions below: 24 hour support line- [phone] or ITAMAR support at [phone] (option 2)  Download the [employer] AmericanWatchPAT One" app through the google play store or App Store  Be sure to turn on or enable access to bluetooth in [place] on your smartphone/ device  Make sure no other bluetooth devices are on and within the vicinity of your smartphone/ device and WatchPAT watch during testing.  Make sure to leave your smart phone/ device plugged in and charging all night.  When ready for bed:  Follow the instructions step by step in the WatchPAT One App to activate the testing device. For additional instructions, including video instruction, visit the WatchPAT One video on Youtube. You can search for [place] One within Youtube (video is 4 minutes and 18 seconds) or enter: https://youtube/watch?v=BCce_vbiwxE Please note: You will be prompted to enter a Pin to connect via bluetooth when starting the test. The PIN will be assigned to you when you receive the test.  The device is disposable, but it recommended that you retain the device until you receive a call letting you know the study has been received and the results have been interpreted.  We will let you know if the study did not transmit to [place] properly after the test is completed. You do not need to call us to confirm the receipt of the test.  Please complete the test within 48 hours of receiving PIN.   Frequently Asked Questions:  What is Watch [place] one?  A single use fully disposable home sleep apnea testing device and will not need to be returned after  completion.  What are the requirements to use WatchPAT one?  The be able to have a successful watchpat one sleep study, you should have your Watch pat one device, your smart phone, watch pat one app, your PIN number and Internet access What type of phone do I need?  You should have a smart phone that uses Android 5.1 and  above or any Iphone with IOS 10 and above How can I download the WatchPAT one app?  Based on your device type search for WatchPAT one app either in google play for android devices or APP store for Iphone's Where will I get my PIN for the study?  Your PIN will be provided by your physician's office. It is used for authentication and if you lose/forget your PIN, please reach out to your providers office.  I do not have Internet at home. Can I do WatchPAT one study?  WatchPAT One needs Internet connection throughout the night to be able to transmit the sleep data. You can use your home/local internet or your cellular's data package. However, it is always recommended to use home/local Internet. It is estimated that between 20MB-30MB will be used with each study.However, the application will be looking for 80MB space in the phone to start the study.  What happens if I lose internet or bluetooth connection?  During the internet disconnection, your phone will not be able to transmit the sleep data. All the data, will be stored in your phone. As soon as the internet connection is back on, the phone will being sending the sleep data. During the bluetooth disconnection, WatchPAT one will not be able to to send the sleep data to your phone. Data will be kept in the [facility] one until two devices have bluetooth connection back on. As soon as the connection is back on, WatchPAT one will send the sleep data to the phone.  How long do I need to wear the WatchPAT one?  After you start the study, you should wear the device at least 6 hours.  How far should I keep my phone from the device?  During the night, your phone should be within 15 feet.  What happens if I leave the room for restroom or other reasons?  Leaving the room for any reason will not cause any problem. As soon as your get back to the room, both devices will reconnect and will continue to send the sleep data. Can I use my phone during the sleep study?   Yes, you can use your phone as usual during the study. But it is recommended to put your watchpat one on when you are ready to go to bed.  How will I get my study results?  A soon as you completed your study, your sleep data will be sent to the provider. They will then share the results with you when they are ready.   Follow-Up: At [facility], you and your health needs are our priority.  As part of our continuing mission to provide you with exceptional heart care, we have created designated Provider Care Teams.  These Care Teams include your primary Cardiologist (physician) and Advanced Practice Providers (APPs -  Physician Assistants and Nurse Practitioners) who all work together to provide you with the care you need, when you need it.  We recommend signing up for the patient portal called "MyChart".  Sign up information is provided on this After Visit Summary.  MyChart is used to connect with patients for Virtual Visits (  Telemedicine).  Patients are able to view lab/test results, encounter notes, upcoming appointments, etc.  Non-urgent messages can be sent to your provider as well.   To learn more about what you can do with MyChart, go to [email or web address].    Your next appointment:   1 yr with Dr. Oval Linsey- We will send you a letter to schedule      Signed, Finis Bud, NP  09/11/2021 1:17 PM    [place]

## 2021-09-11 ENCOUNTER — Telehealth: Payer: Self-pay | Admitting: *Deleted

## 2021-09-11 ENCOUNTER — Telehealth (HOSPITAL_BASED_OUTPATIENT_CLINIC_OR_DEPARTMENT_OTHER): Payer: Self-pay

## 2021-09-11 ENCOUNTER — Ambulatory Visit (INDEPENDENT_AMBULATORY_CARE_PROVIDER_SITE_OTHER): Payer: Medicare Other | Admitting: Nurse Practitioner

## 2021-09-11 ENCOUNTER — Encounter (HOSPITAL_BASED_OUTPATIENT_CLINIC_OR_DEPARTMENT_OTHER): Payer: Self-pay | Admitting: Nurse Practitioner

## 2021-09-11 VITALS — BP 130/76 | HR 71 | Ht 70.0 in | Wt 251.0 lb

## 2021-09-11 DIAGNOSIS — M3130 Wegener's granulomatosis without renal involvement: Secondary | ICD-10-CM

## 2021-09-11 DIAGNOSIS — I6523 Occlusion and stenosis of bilateral carotid arteries: Secondary | ICD-10-CM | POA: Diagnosis not present

## 2021-09-11 DIAGNOSIS — I77819 Aortic ectasia, unspecified site: Secondary | ICD-10-CM

## 2021-09-11 DIAGNOSIS — I5042 Chronic combined systolic (congestive) and diastolic (congestive) heart failure: Secondary | ICD-10-CM

## 2021-09-11 DIAGNOSIS — E785 Hyperlipidemia, unspecified: Secondary | ICD-10-CM

## 2021-09-11 DIAGNOSIS — I739 Peripheral vascular disease, unspecified: Secondary | ICD-10-CM | POA: Diagnosis not present

## 2021-09-11 DIAGNOSIS — I480 Paroxysmal atrial fibrillation: Secondary | ICD-10-CM | POA: Diagnosis not present

## 2021-09-11 DIAGNOSIS — I10 Essential (primary) hypertension: Secondary | ICD-10-CM | POA: Diagnosis not present

## 2021-09-11 DIAGNOSIS — G4730 Sleep apnea, unspecified: Secondary | ICD-10-CM | POA: Diagnosis not present

## 2021-09-11 DIAGNOSIS — I2510 Atherosclerotic heart disease of native coronary artery without angina pectoris: Secondary | ICD-10-CM | POA: Diagnosis not present

## 2021-09-11 NOTE — Patient Instructions (Signed)
Medication Instructions:  Your Physician recommend you continue on your current medication as directed.    *If you need a refill on your cardiac medications before your next appointment, please call your pharmacy*  Testing/Procedures: WatchPAT?  Is a FDA cleared portable home sleep study test that uses a watch and 3 points of contact to monitor 7 different channels, including your heart rate, oxygen saturations, body position, snoring, and chest motion.  The study is easy to use from the comfort of your own home and accurately detect sleep apnea.  Before bed, you attach the chest sensor, attached the sleep apnea bracelet to your nondominant hand, and attach the finger probe.  After the study, the raw data is downloaded from the watch and scored for apnea events.   For more information: [email or web address]  Patient Testing Instructions:  Do not put battery into the device until bedtime when you are ready to begin the test. Please call the support number if you need assistance after following the instructions below: 24 hour support line- [phone] or [facility] support at [phone] (option 2)  Download the [employer] One" app through the google play store or App Store  Be sure to turn on or enable access to bluetooth in settlings on your smartphone/ device  Make sure no other bluetooth devices are on and within the vicinity of your smartphone/ device and WatchPAT watch during testing.  Make sure to leave your smart phone/ device plugged in and charging all night.  When ready for bed:  Follow the instructions step by step in the WatchPAT One App to activate the testing device. For additional instructions, including video instruction, visit the WatchPAT One video on Youtube. You can search for [place] One within Youtube (video is 4 minutes and 18 seconds) or enter: https://youtube/watch?v=BCce_vbiwxE Please note: You will be prompted to enter a Pin to connect via  bluetooth when starting the test. The PIN will be assigned to you when you receive the test.  The device is disposable, but it recommended that you retain the device until you receive a call letting you know the study has been received and the results have been interpreted.  We will let you know if the study did not transmit to [place] properly after the test is completed. You do not need to call us to confirm the receipt of the test.  Please complete the test within 48 hours of receiving PIN.   Frequently Asked Questions:  What is Watch [place] one?  A single use fully disposable home sleep apnea testing device and will not need to be returned after completion.  What are the requirements to use WatchPAT one?  The be able to have a successful watchpat one sleep study, you should have your Watch pat one device, your smart phone, watch pat one app, your PIN number and Internet access What type of phone do I need?  You should have a smart phone that uses Android 5.1 and above or any Iphone with IOS 10 and above How can I download the WatchPAT one app?  Based on your device type search for WatchPAT one app either in google play for android devices or APP store for Iphone's Where will I get my PIN for the study?  Your PIN will be provided by your physician's office. It is used for authentication and if you lose/forget your PIN, please reach out to your providers office.  I do not have Internet at home. Can I do WatchPAT one study?  WatchPAT  One needs Internet connection throughout the night to be able to transmit the sleep data. You can use your home/local internet or your cellular's data package. However, it is always recommended to use home/local Internet. It is estimated that between 20MB-30MB will be used with each study.However, the application will be looking for 80MB space in the phone to start the study.  What happens if I lose internet or bluetooth connection?  During the internet disconnection, your  phone will not be able to transmit the sleep data. All the data, will be stored in your phone. As soon as the internet connection is back on, the phone will being sending the sleep data. During the bluetooth disconnection, WatchPAT one will not be able to to send the sleep data to your phone. Data will be kept in the [facility] one until two devices have bluetooth connection back on. As soon as the connection is back on, WatchPAT one will send the sleep data to the phone.  How long do I need to wear the WatchPAT one?  After you start the study, you should wear the device at least 6 hours.  How far should I keep my phone from the device?  During the night, your phone should be within 15 feet.  What happens if I leave the room for restroom or other reasons?  Leaving the room for any reason will not cause any problem. As soon as your get back to the room, both devices will reconnect and will continue to send the sleep data. Can I use my phone during the sleep study?  Yes, you can use your phone as usual during the study. But it is recommended to put your watchpat one on when you are ready to go to bed.  How will I get my study results?  A soon as you completed your study, your sleep data will be sent to the provider. They will then share the results with you when they are ready.   Follow-Up: At [facility], you and your health needs are our priority.  As part of our continuing mission to provide you with exceptional heart care, we have created designated Provider Care Teams.  These Care Teams include your primary Cardiologist (physician) and Advanced Practice Providers (APPs -  Physician Assistants and Nurse Practitioners) who all work together to provide you with the care you need, when you need it.  We recommend signing up for the patient portal called "MyChart".  Sign up information is provided on this After Visit Summary.  MyChart is used to connect with patients for Virtual Visits  (Telemedicine).  Patients are able to view lab/test results, encounter notes, upcoming appointments, etc.  Non-urgent messages can be sent to your provider as well.   To learn more about what you can do with MyChart, go to [email or web address].    Your next appointment:   1 yr with Dr. Oval Linsey- We will send you a letter to schedule

## 2021-09-11 NOTE — Telephone Encounter (Signed)
Staff message sent to Gary [name] ok to activate itamar deivce.

## 2021-09-11 NOTE — Telephone Encounter (Signed)
Per wanda wandell, okay to activate Itamar [id number], patient verbalizes understanding and is very appreciative. Patient plans to take it next week!

## 2021-09-12 ENCOUNTER — Other Ambulatory Visit: Payer: Self-pay | Admitting: Cardiovascular Disease

## 2021-09-12 DIAGNOSIS — I480 Paroxysmal atrial fibrillation: Secondary | ICD-10-CM

## 2021-09-12 DIAGNOSIS — Z7901 Long term (current) use of anticoagulants: Secondary | ICD-10-CM

## 2021-09-14 NOTE — Telephone Encounter (Signed)
Prescription refill request received for warfarin Lov: 09/11/2021 Next INR check: 9/21 Warfarin tablet strength: '5mg'$    Refill sent.

## 2021-09-24 ENCOUNTER — Encounter (HOSPITAL_BASED_OUTPATIENT_CLINIC_OR_DEPARTMENT_OTHER): Payer: Self-pay | Admitting: Cardiovascular Disease

## 2021-09-24 ENCOUNTER — Ambulatory Visit: Payer: Medicare Other | Attending: Cardiovascular Disease

## 2021-09-24 DIAGNOSIS — I719 Aortic aneurysm of unspecified site, without rupture: Secondary | ICD-10-CM | POA: Diagnosis not present

## 2021-09-24 DIAGNOSIS — I4891 Unspecified atrial fibrillation: Secondary | ICD-10-CM | POA: Diagnosis not present

## 2021-09-24 DIAGNOSIS — Z7901 Long term (current) use of anticoagulants: Secondary | ICD-10-CM | POA: Diagnosis not present

## 2021-09-24 DIAGNOSIS — I480 Paroxysmal atrial fibrillation: Secondary | ICD-10-CM | POA: Diagnosis not present

## 2021-09-24 DIAGNOSIS — E1169 Type 2 diabetes mellitus with other specified complication: Secondary | ICD-10-CM | POA: Diagnosis not present

## 2021-09-24 DIAGNOSIS — K219 Gastro-esophageal reflux disease without esophagitis: Secondary | ICD-10-CM | POA: Diagnosis not present

## 2021-09-24 DIAGNOSIS — I5022 Chronic systolic (congestive) heart failure: Secondary | ICD-10-CM | POA: Diagnosis not present

## 2021-09-24 DIAGNOSIS — D6869 Other thrombophilia: Secondary | ICD-10-CM | POA: Diagnosis not present

## 2021-09-24 DIAGNOSIS — I25729 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: Secondary | ICD-10-CM | POA: Diagnosis not present

## 2021-09-24 LAB — POCT INR: INR: 2.8 (ref 2.0–3.0)

## 2021-09-24 NOTE — Patient Instructions (Signed)
Description   Continue 1 tablet Daily, EXCEPT 1/2 TABLET Monday and Friday. Recheck in 6 weeks. Please call us at [phone] with any questions.

## 2021-09-25 ENCOUNTER — Telehealth (HOSPITAL_BASED_OUTPATIENT_CLINIC_OR_DEPARTMENT_OTHER): Payer: Self-pay | Admitting: Cardiovascular Disease

## 2021-09-25 ENCOUNTER — Encounter (HOSPITAL_COMMUNITY): Payer: Self-pay

## 2021-09-25 ENCOUNTER — Inpatient Hospital Stay (HOSPITAL_COMMUNITY)
Admission: EM | Admit: 2021-09-25 | Discharge: 2021-09-29 | DRG: 247 | Disposition: A | Discharge status: Home / Self Care | Payer: Medicare Other | Attending: Cardiovascular Disease | Admitting: Cardiovascular Disease

## 2021-09-25 ENCOUNTER — Other Ambulatory Visit: Payer: Self-pay

## 2021-09-25 ENCOUNTER — Emergency Department (HOSPITAL_COMMUNITY): Payer: Medicare Other

## 2021-09-25 DIAGNOSIS — I447 Left bundle-branch block, unspecified: Secondary | ICD-10-CM | POA: Diagnosis not present

## 2021-09-25 DIAGNOSIS — Z951 Presence of aortocoronary bypass graft: Secondary | ICD-10-CM

## 2021-09-25 DIAGNOSIS — Z823 Family history of stroke: Secondary | ICD-10-CM

## 2021-09-25 DIAGNOSIS — Z8249 Family history of ischemic heart disease and other diseases of the circulatory system: Secondary | ICD-10-CM

## 2021-09-25 DIAGNOSIS — I214 Non-ST elevation (NSTEMI) myocardial infarction: Secondary | ICD-10-CM | POA: Diagnosis present

## 2021-09-25 DIAGNOSIS — I110 Hypertensive heart disease with heart failure: Secondary | ICD-10-CM | POA: Diagnosis present

## 2021-09-25 DIAGNOSIS — I2510 Atherosclerotic heart disease of native coronary artery without angina pectoris: Secondary | ICD-10-CM | POA: Diagnosis present

## 2021-09-25 DIAGNOSIS — Z85828 Personal history of other malignant neoplasm of skin: Secondary | ICD-10-CM | POA: Diagnosis not present

## 2021-09-25 DIAGNOSIS — R778 Other specified abnormalities of plasma proteins: Secondary | ICD-10-CM | POA: Diagnosis not present

## 2021-09-25 DIAGNOSIS — I25710 Atherosclerosis of autologous vein coronary artery bypass graft(s) with unstable angina pectoris: Secondary | ICD-10-CM | POA: Diagnosis not present

## 2021-09-25 DIAGNOSIS — I10 Essential (primary) hypertension: Secondary | ICD-10-CM | POA: Diagnosis not present

## 2021-09-25 DIAGNOSIS — I480 Paroxysmal atrial fibrillation: Secondary | ICD-10-CM | POA: Diagnosis not present

## 2021-09-25 DIAGNOSIS — I4891 Unspecified atrial fibrillation: Secondary | ICD-10-CM | POA: Diagnosis present

## 2021-09-25 DIAGNOSIS — I7121 Aneurysm of the ascending aorta, without rupture: Secondary | ICD-10-CM | POA: Diagnosis present

## 2021-09-25 DIAGNOSIS — E118 Type 2 diabetes mellitus with unspecified complications: Secondary | ICD-10-CM

## 2021-09-25 DIAGNOSIS — I25119 Atherosclerotic heart disease of native coronary artery with unspecified angina pectoris: Secondary | ICD-10-CM | POA: Diagnosis present

## 2021-09-25 DIAGNOSIS — Z955 Presence of coronary angioplasty implant and graft: Secondary | ICD-10-CM | POA: Diagnosis not present

## 2021-09-25 DIAGNOSIS — R079 Chest pain, unspecified: Secondary | ICD-10-CM

## 2021-09-25 DIAGNOSIS — I5042 Chronic combined systolic (congestive) and diastolic (congestive) heart failure: Secondary | ICD-10-CM | POA: Diagnosis present

## 2021-09-25 DIAGNOSIS — Z888 Allergy status to other drugs, medicaments and biological substances status: Secondary | ICD-10-CM | POA: Diagnosis not present

## 2021-09-25 DIAGNOSIS — M1990 Unspecified osteoarthritis, unspecified site: Secondary | ICD-10-CM | POA: Diagnosis present

## 2021-09-25 DIAGNOSIS — E039 Hypothyroidism, unspecified: Secondary | ICD-10-CM | POA: Diagnosis present

## 2021-09-25 DIAGNOSIS — G4733 Obstructive sleep apnea (adult) (pediatric): Secondary | ICD-10-CM | POA: Diagnosis present

## 2021-09-25 DIAGNOSIS — Z79890 Hormone replacement therapy (postmenopausal): Secondary | ICD-10-CM | POA: Diagnosis not present

## 2021-09-25 DIAGNOSIS — I200 Unstable angina: Secondary | ICD-10-CM | POA: Diagnosis not present

## 2021-09-25 DIAGNOSIS — Z91199 Patient's noncompliance with other medical treatment and regimen due to unspecified reason: Secondary | ICD-10-CM

## 2021-09-25 DIAGNOSIS — K219 Gastro-esophageal reflux disease without esophagitis: Secondary | ICD-10-CM | POA: Diagnosis not present

## 2021-09-25 DIAGNOSIS — M3130 Wegener's granulomatosis without renal involvement: Secondary | ICD-10-CM | POA: Diagnosis present

## 2021-09-25 DIAGNOSIS — Z79899 Other long term (current) drug therapy: Secondary | ICD-10-CM | POA: Diagnosis not present

## 2021-09-25 DIAGNOSIS — E785 Hyperlipidemia, unspecified: Secondary | ICD-10-CM | POA: Diagnosis not present

## 2021-09-25 DIAGNOSIS — I25810 Atherosclerosis of coronary artery bypass graft(s) without angina pectoris: Secondary | ICD-10-CM | POA: Diagnosis not present

## 2021-09-25 DIAGNOSIS — E1169 Type 2 diabetes mellitus with other specified complication: Secondary | ICD-10-CM | POA: Diagnosis present

## 2021-09-25 DIAGNOSIS — Z96641 Presence of right artificial hip joint: Secondary | ICD-10-CM | POA: Diagnosis present

## 2021-09-25 DIAGNOSIS — Z87891 Personal history of nicotine dependence: Secondary | ICD-10-CM

## 2021-09-25 DIAGNOSIS — R7989 Other specified abnormal findings of blood chemistry: Secondary | ICD-10-CM

## 2021-09-25 DIAGNOSIS — E119 Type 2 diabetes mellitus without complications: Secondary | ICD-10-CM | POA: Diagnosis present

## 2021-09-25 DIAGNOSIS — Z7901 Long term (current) use of anticoagulants: Secondary | ICD-10-CM | POA: Diagnosis not present

## 2021-09-25 DIAGNOSIS — Z7984 Long term (current) use of oral hypoglycemic drugs: Secondary | ICD-10-CM | POA: Diagnosis not present

## 2021-09-25 DIAGNOSIS — R0789 Other chest pain: Secondary | ICD-10-CM | POA: Diagnosis not present

## 2021-09-25 DIAGNOSIS — Z85820 Personal history of malignant melanoma of skin: Secondary | ICD-10-CM

## 2021-09-25 DIAGNOSIS — I259 Chronic ischemic heart disease, unspecified: Secondary | ICD-10-CM | POA: Diagnosis not present

## 2021-09-25 LAB — CBC WITH DIFFERENTIAL/PLATELET
Abs Immature Granulocytes: 0.04 10*3/uL (ref 0.00–0.07)
Basophils Absolute: 0.1 10*3/uL (ref 0.0–0.1)
Basophils Relative: 1 %
Eosinophils Absolute: 0.2 10*3/uL (ref 0.0–0.5)
Eosinophils Relative: 2 %
HCT: 36.3 % — ABNORMAL LOW (ref 39.0–52.0)
Hemoglobin: 11.7 g/dL — ABNORMAL LOW (ref 13.0–17.0)
Immature Granulocytes: 0 %
Lymphocytes Relative: 10 %
Lymphs Abs: 1 10*3/uL (ref 0.7–4.0)
MCH: 27.1 pg (ref 26.0–34.0)
MCHC: 32.2 g/dL (ref 30.0–36.0)
MCV: 84 fL (ref 80.0–100.0)
Monocytes Absolute: 0.8 10*3/uL (ref 0.1–1.0)
Monocytes Relative: 8 %
Neutro Abs: 7.8 10*3/uL — ABNORMAL HIGH (ref 1.7–7.7)
Neutrophils Relative %: 79 %
Platelets: 297 10*3/uL (ref 150–400)
RBC: 4.32 MIL/uL (ref 4.22–5.81)
RDW: 15.1 % (ref 11.5–15.5)
WBC: 10 10*3/uL (ref 4.0–10.5)
nRBC: 0 % (ref 0.0–0.2)

## 2021-09-25 LAB — COMPREHENSIVE METABOLIC PANEL
ALT: 20 U/L (ref 0–44)
AST: 28 U/L (ref 15–41)
Albumin: 3.2 g/dL — ABNORMAL LOW (ref 3.5–5.0)
Alkaline Phosphatase: 83 U/L (ref 38–126)
Anion gap: 12 (ref 5–15)
BUN: 20 mg/dL (ref 8–23)
CO2: 22 mmol/L (ref 22–32)
Calcium: 8.5 mg/dL — ABNORMAL LOW (ref 8.9–10.3)
Chloride: 105 mmol/L (ref 98–111)
Creatinine, Ser: 0.93 mg/dL (ref 0.61–1.24)
GFR, Estimated: >60 mL/min (ref >60)
Glucose, Bld: 160 mg/dL — ABNORMAL HIGH (ref 70–99)
Potassium: 3.4 mmol/L — ABNORMAL LOW (ref 3.5–5.1)
Sodium: 139 mmol/L (ref 135–145)
Total Bilirubin: 0.5 mg/dL (ref 0.3–1.2)
Total Protein: 5.8 g/dL — ABNORMAL LOW (ref 6.5–8.1)

## 2021-09-25 LAB — TROPONIN I (HIGH SENSITIVITY)
Troponin I (High Sensitivity): 80 ng/L — ABNORMAL HIGH (ref <18)
Troponin I (High Sensitivity): 96 ng/L — ABNORMAL HIGH (ref <18)
Troponin I (High Sensitivity): 165 ng/L (ref <18)
Troponin I (High Sensitivity): 219 ng/L (ref <18)

## 2021-09-25 LAB — LIPASE, BLOOD: Lipase: 37 U/L (ref 11–51)

## 2021-09-25 LAB — PROTIME-INR
INR: 1.9 — ABNORMAL HIGH (ref 0.8–1.2)
Prothrombin Time: 21.7 seconds — ABNORMAL HIGH (ref 11.4–15.2)

## 2021-09-25 MED ORDER — WARFARIN SODIUM 5 MG PO TABS: 5.0000 mg | ORAL_TABLET | Freq: Every day | ORAL | Status: DC

## 2021-09-25 MED ORDER — LEVOTHYROXINE SODIUM 137 MCG PO TABS: 137.0000 ug | ORAL_TABLET | ORAL | Status: DC

## 2021-09-25 MED ORDER — LOSARTAN POTASSIUM 50 MG PO TABS
100.0000 mg | ORAL_TABLET | Freq: Every day | ORAL | Status: DC
  Administered 2021-09-25 – 2021-09-29 (×5): 100 mg via ORAL
  Filled 2021-09-25 (×5): qty 2

## 2021-09-25 MED ORDER — WARFARIN SODIUM 2.5 MG PO TABS
2.5000 mg | ORAL_TABLET | Freq: Once | ORAL | Status: AC
  Administered 2021-09-25: 2.5 mg via ORAL
  Filled 2021-09-25: qty 1

## 2021-09-25 MED ORDER — WARFARIN - PHARMACIST DOSING INPATIENT: Freq: Every day | Status: DC

## 2021-09-25 MED ORDER — LEVOTHYROXINE SODIUM 75 MCG PO TABS: 205.5000 ug | ORAL_TABLET | ORAL | Status: DC

## 2021-09-25 MED ORDER — AMLODIPINE BESYLATE 2.5 MG PO TABS
2.5000 mg | ORAL_TABLET | Freq: Every day | ORAL | Status: DC
  Administered 2021-09-25 – 2021-09-29 (×5): 2.5 mg via ORAL
  Filled 2021-09-25 (×5): qty 1

## 2021-09-25 MED ORDER — NITROGLYCERIN 0.4 MG SL SUBL: 0.4000 mg | SUBLINGUAL_TABLET | SUBLINGUAL | Status: DC | PRN

## 2021-09-25 MED ORDER — HYDROCHLOROTHIAZIDE 12.5 MG PO TABS
12.5000 mg | ORAL_TABLET | Freq: Every day | ORAL | Status: DC
  Administered 2021-09-25 – 2021-09-29 (×5): 12.5 mg via ORAL
  Filled 2021-09-25 (×5): qty 1

## 2021-09-25 MED ORDER — LEVOTHYROXINE SODIUM 25 MCG PO TABS
137.0000 ug | ORAL_TABLET | ORAL | Status: DC
  Administered 2021-09-26 – 2021-09-29 (×4): 137 ug via ORAL
  Filled 2021-09-25 (×4): qty 1

## 2021-09-25 MED ORDER — SULFAMETHOXAZOLE-TRIMETHOPRIM 400-80 MG PO TABS
1.0000 | ORAL_TABLET | Freq: Every day | ORAL | Status: DC
  Administered 2021-09-26 – 2021-09-29 (×4): 1 via ORAL
  Filled 2021-09-25 (×4): qty 1

## 2021-09-25 MED ORDER — ONDANSETRON HCL 4 MG/2ML IJ SOLN: 4.0000 mg | Freq: Four times a day (QID) | INTRAMUSCULAR | Status: DC | PRN

## 2021-09-25 MED ORDER — CARVEDILOL 25 MG PO TABS
25.0000 mg | ORAL_TABLET | Freq: Two times a day (BID) | ORAL | Status: DC
  Administered 2021-09-25 – 2021-09-29 (×8): 25 mg via ORAL
  Filled 2021-09-25: qty 2
  Filled 2021-09-25 (×7): qty 1

## 2021-09-25 MED ORDER — ACETAMINOPHEN 325 MG PO TABS
650.0000 mg | ORAL_TABLET | ORAL | Status: DC | PRN
  Administered 2021-09-26 – 2021-09-28 (×2): 650 mg via ORAL
  Filled 2021-09-25 (×2): qty 2

## 2021-09-25 MED ORDER — WARFARIN SODIUM 2.5 MG PO TABS: 2.5000 mg | ORAL_TABLET | Freq: Once | ORAL | Status: DC

## 2021-09-25 MED ORDER — VENLAFAXINE HCL ER 150 MG PO CP24
150.0000 mg | ORAL_CAPSULE | Freq: Every day | ORAL | Status: DC
  Administered 2021-09-26 – 2021-09-29 (×4): 150 mg via ORAL
  Filled 2021-09-25 (×5): qty 1

## 2021-09-25 NOTE — ED Provider Notes (Signed)
Patient was signed out to me at 1500 by Dr. Maryan Rued pending cardiology evaluation and recommendations.  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  He was found to have an elevated troponin with new T wave inversions.  He is currently chest pain-free.  On my exam, his vitals are within normal range, heart is regular rate and rhythm and lungs are clear to auscultation bilaterally.  Cardiology evaluated the patient at bedside and plans to admit the patient for observation.   Ottie Glazier, DO 09/25/21 1726

## 2021-09-25 NOTE — ED Triage Notes (Signed)
Pt BIB [facility] from home d/t CP that had been bothering him for a while each night that he had been treating as acid reflux. This morning it started again at 0800 while he was sitting in a chair while at rest. He endorses feeling pain in his Lt arm & breast bone as well. A/Ox4. EMS arrived & gave 324 mg ASA and reported his pain 7/10. No nitro was given d/t pain resolving while en route to ED. Does have Hx of cardiac bypass 20 yrs ago (per pt). 18g Lt PIV, 12L unremarkable- NSR, 139/99, 63 bpm, 97% on RA.

## 2021-09-25 NOTE — H&P (Addendum)
Cardiology Admission   Patient ID: MOURAD CWIKLA MRN: [id number]; DOB: 12-Jul-1945  Admit date: 09/25/2021 Date of Consult: 09/25/2021  PCP:  [name], [place] [facility] Cardiologist:  [name], MD        Patient Profile:   Gary [name] is a 76 y.o. male with a hx of hypertension, melanoma, CAD s/p PCI (stent to distal RCA 2003, stents to RCA and LAD in 2004, stent to proximal mid and distal LAD 2005) and CABG (2006), mild ascending aortic aneurysm, chronic systolic and diastolic heart failure, hyperlipidemia, paroxysmal atrial fibrillation, and OSA (not compliant with CPAP due to sinus issues) who is being seen 09/25/2021 for the evaluation of atypical chest pain and elevated troponin at the request of Dr. Maryan Rued.  History of Present Illness:   Gary [name] is a 76 year old male with above-noted medical history who presented to the emergency department today via EMS after experiencing an episode of unusual chest pain at home this morning.  Patient reports that approximately 30 minutes after eating breakfast this morning, he experienced left chest/axillary pain that radiated into his throat and jaw.  Patient describes this pain as sharp in nature, rated 8 out of 10.  Patient recalls that with his previous CAD induced symptoms, pain radiated into his neck.  Due to symptom similarity, patient called the cardiology office and he was ultimately advised to contact 911.  Patient reports that by the time EMS arrived and loaded him into the ambulance he was chest pain-free.  EMS did administer 4, 81 mg aspirin tablets.    Interestingly patient reports that he has actually experienced similar left axillary/chest pain every single night since 9/7.  The only difference with pain this morning was its severity and radiation to his neck.  This nocturnal pain occurs when patient is lying down on his right side.  It always resolves when he sits back up.  Patient states  that he reported this pain to Finis Bud nurse practitioner at the time of his regularly scheduled cardiology follow-up on 9/8.  ECG from this day was without acute ischemic change.  Patient attributed his symptoms to possible acid reflux and was seen by an APP at his primary care office yesterday, 9/21.  Patient was given an aggressive GI regimen including Protonix, Pepcid.  Patient denies any recent changes to his exertional tolerance or chest pain with exertion.  He also denies orthopnea, lower extremity edema.  In the ED patient underwent lab testing with troponins measured at 80 and 96.  Patient's ECG shows a normal sinus rhythm with inferior lead T wave inversions that were not previously observed on his 9/8 ECG.   Past Medical History:  Diagnosis Date   Aortic aneurysm ([place]) 02/20/2016   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   Arthritis    Atrial fibrillation ([place]) 11/13/2014   Cancer ([place])    skin   Chronic combined systolic and diastolic heart failure ([place]) 02/20/2016   LVEF 45-50% 11/2014   Coronary artery disease    Depression    Essential hypertension 11/13/2014   Hyperlipidemia    Hyperlipidemia 11/13/2014   Hypertension    Sleep apnea    CPAP 'Uses half the time"  last study 2009   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    Past Surgical History:  Procedure Laterality Date   CARDIOVERSION N/A 12/06/2014   Procedure: CARDIOVERSION;  Surgeon: [name], MD;  Location: [facility];  Service: Cardiovascular;  Laterality: N/A;   CARDIOVERSION  N/A 05/26/2017   Procedure: CARDIOVERSION;  Surgeon: [name], MD;  Location: [facility];  Service: Cardiovascular;  Laterality: N/A;   CARDIOVERSION N/A 04/25/2019   Procedure: CARDIOVERSION;  Surgeon: Sanda Klein, MD;  Location: [facility];  Service: Cardiovascular;  Laterality: N/A;   CARPAL TUNNEL RELEASE     CORONARY ANGIOPLASTY     multiple stents   CORONARY ARTERY BYPASS GRAFT  2006   EYE SURGERY     right  eye-  muscular repair   TOTAL HIP ARTHROPLASTY  01/03/2012   Procedure: TOTAL HIP ARTHROPLASTY;  Surgeon: [name], MD;  Location: [facility];  Service: Orthopedics;  Laterality: Right;     Home Medications:  Prior to Admission medications   Medication Sig Start Date End Date Taking? Authorizing Provider  amLODipine (NORVASC) 2.5 MG tablet TAKE 1 TABLET BY MOUTH EVERY DAY 11/18/20   [name], MD  calcium carbonate (OS-CAL) 600 MG TABS tablet Take 600 mg by mouth daily with breakfast.    [provider]  carvedilol (COREG) 25 MG tablet TAKE 1 TABLET BY MOUTH TWICE A DAY 02/16/21   [name], MD  cetirizine (ZYRTEC) 10 MG tablet Take 10 mg by mouth daily.    [provider]  Cholecalciferol (VITAMIN D3) 125 MCG (5000 UT) TABS Take 5,000 Units by mouth daily.    [provider]  Coenzyme Q10 (CO Q 10) 100 MG CAPS Take 300 mg by mouth daily.     [provider]  hydrochlorothiazide (HYDRODIURIL) 12.5 MG tablet TAKE 1 TABLET BY MOUTH EVERY DAY 07/01/21   [name], MD  levothyroxine (SYNTHROID, LEVOTHROID) 137 MCG tablet Take 137-205.5 mcg by mouth See admin instructions. Take 205.5 mcg by mouth on Wednesday.  Take 137 mcg by mouth daily on all other days 10/27/14   [provider]  losartan (COZAAR) 100 MG tablet TAKE 1 TABLET BY MOUTH EVERY DAY 02/16/21   [name], MD  metFORMIN (GLUCOPHAGE) 500 MG tablet Take 500 mg by mouth 2 (two) times daily. 10/27/17   [provider]  Multiple Vitamin (MULTIVITAMIN WITH MINERALS) TABS tablet Take 1 tablet by mouth daily. Centrum silver    [provider]  predniSONE (DELTASONE) 5 MG tablet Take 5 mg by mouth as needed (for wagoners).    [provider]  REPATHA SURECLICK 696 MG/ML SOAJ INJECT 140 MG INTO THE SKIN EVERY 14 (FOURTEEN) DAYS. INJECT 1 PEN INTO THE FATTY TISSUE SKIN EVERY 14 DAY 04/27/21   [name], MD  riTUXimab (RITUXAN IV) Inject  1 application into the vein every 6 (six) months.     [provider]  sodium chloride (OCEAN) 0.65 % SOLN nasal spray Place 1 spray into both nostrils daily as needed for congestion.     [provider]  sulfamethoxazole-trimethoprim (BACTRIM) 400-80 MG tablet Take 1 tablet by mouth daily.    [provider]  testosterone cypionate (DEPOTESTOSTERONE CYPIONATE) 200 MG/ML injection Inject 100 mg into the muscle every 14 (fourteen) days. 10/09/14   [provider]  venlafaxine XR (EFFEXOR-XR) 150 MG 24 hr capsule Take 150 mg by mouth daily.     [provider]  warfarin (COUMADIN) 5 MG tablet TAKE 1/2 TO 1 TABLET BY MOUTH EVERY DAY OR AS DIRECTED BY COUMADIN CLINIC 09/14/21   [name], MD    Inpatient Medications: Scheduled Meds:  Continuous Infusions:  PRN Meds:   Allergies:    Allergies  Allergen Reactions   Statins Other (See Comments)  Muscle pain    Social History:   Social History   Socioeconomic History   Marital status: Married    Spouse name: Not on file   Number of children: Not on file   Years of education: Not on file   Highest education level: Not on file  Occupational History   Not on file  Tobacco Use   Smoking status: Former    Types: Cigarettes    Quit date: 12/24/1983    Years since quitting: 37.7   Smokeless tobacco: Never  Vaping Use   Vaping Use: Never used  Substance and Sexual Activity   Alcohol use: No    Alcohol/week: 0.0 standard drinks of alcohol   Drug use: No   Sexual activity: Not on file  Other Topics Concern   Not on file  Social History Narrative   Not on file   Social Determinants of Health   Financial Resource Strain: Not on file  Food Insecurity: Not on file  Transportation Needs: Not on file  Physical Activity: Not on file  Stress: Not on file  Social Connections: Not on file  Intimate Partner Violence: Not on file    Family History:    Family History  Problem  Relation Age of Onset   Heart attack Mother    Other Father        odd stomach disorder   Stroke Maternal Grandmother    Heart attack Maternal Grandfather    Heart attack Paternal Grandmother    Heart attack Paternal Grandfather      ROS:  Please see the history of present illness.   All other ROS reviewed and negative.     Physical Exam/Data:   Vitals:   09/25/21 1530 09/25/21 1545 09/25/21 1600 09/25/21 1615  BP: (!) 160/80 (!) 146/94 136/76 (!) 144/94  Pulse: 60 62 62 (!) 59  Resp: 19 (!) 21 19 (!) 21  Temp:    98.6 F (37 C)  TempSrc:    Oral  SpO2: 98% 98% 97% 97%   No intake or output data in the 24 hours ending 09/25/21 1648    09/11/2021    9:09 AM 09/12/2020    9:18 AM 04/01/2020    5:29 PM  Last 3 Weights  Weight (lbs) 251 lb 245 lb 3.2 oz 232 lb  Weight (kg) 113.853 kg 111.222 kg 105.235 kg     There is no height or weight on file to calculate BMI.  General:  Well nourished, well developed, in no acute distress HEENT: normal Neck: no JVD Vascular: No carotid bruits; Distal pulses 2+ bilaterally Cardiac:  normal S1, S2; RRR; no murmur  Lungs:  clear to auscultation bilaterally, no wheezing, rhonchi or rales  Abd: soft, nontender, no hepatomegaly  Ext: no edema Musculoskeletal:  No deformities, BUE and BLE strength normal and equal Skin: warm and dry  Neuro:  CNs 2-12 intact, no focal abnormalities noted Psych:  Normal affect   EKG:  The EKG was personally reviewed and demonstrates: Sinus rhythm with slightly wide QRS indicative of an incomplete left bundle branch block.  T wave inversions in leads II, III and aVF. Telemetry:  Telemetry was personally reviewed and demonstrates: Sinus rhythm  Relevant CV Studies:  09/19/20 TTE  IMPRESSIONS     1. Left ventricular ejection fraction, by estimation, is 50 to 55%. The  left ventricle has low normal function. The left ventricle demonstrates  regional wall motion abnormalities (see scoring diagram/findings  for  description). There is  mild asymmetric  left ventricular hypertrophy of the basal and septal segments. Left  ventricular diastolic parameters were normal.   2. Right ventricular systolic function is normal. The right ventricular  size is normal.   3. Left atrial size was moderately dilated.   4. The mitral valve is degenerative. Trivial mitral valve regurgitation.  No evidence of mitral stenosis. Severe mitral annular calcification.   5. The aortic valve is tricuspid. Aortic valve regurgitation is not  visualized. Mild to moderate aortic valve sclerosis/calcification is  present, without any evidence of aortic stenosis.   6. The inferior vena cava is normal in size with greater than 50%  respiratory variability, suggesting right atrial pressure of 3 mmHg.   Comparison(s): There was moderate concentric hypertrophy. Systolic  function was mildly to moderately reduced. The estimated ejection fraction  was in the range of 40% to 45%. Diffuse hypokinesis.   FINDINGS   Left Ventricle: Distal septal hypokinesis. Left ventricular ejection  fraction, by estimation, is 50 to 55%. The left ventricle has low normal  function. The left ventricle demonstrates regional wall motion  abnormalities. The left ventricular internal  cavity size was normal in size. There is mild asymmetric left ventricular  hypertrophy of the basal and septal segments. Left ventricular diastolic  parameters were normal.   Right Ventricle: The right ventricular size is normal. No increase in  right ventricular wall thickness. Right ventricular systolic function is  normal.   Left Atrium: Left atrial size was moderately dilated.   Right Atrium: Right atrial size was normal in size.   Pericardium: There is no evidence of pericardial effusion.   Mitral Valve: The mitral valve is degenerative in appearance. There is  moderate thickening of the mitral valve leaflet(s). There is moderate  calcification of the mitral  valve leaflet(s). Severe mitral annular  calcification. Trivial mitral valve  regurgitation. No evidence of mitral valve stenosis.   Tricuspid Valve: The tricuspid valve is normal in structure. Tricuspid  valve regurgitation is not demonstrated. No evidence of tricuspid  stenosis.   Aortic Valve: The aortic valve is tricuspid. Aortic valve regurgitation is  not visualized. Mild to moderate aortic valve sclerosis/calcification is  present, without any evidence of aortic stenosis.   Pulmonic Valve: The pulmonic valve was normal in structure. Pulmonic valve  regurgitation is not visualized. No evidence of pulmonic stenosis.   Aorta: The aortic root is normal in size and structure.   Venous: The inferior vena cava is normal in size with greater than 50%  respiratory variability, suggesting right atrial pressure of 3 mmHg.   IAS/Shunts: No atrial level shunt detected by color flow Doppler. IMPRESSIONS     1. Left ventricular ejection fraction, by estimation, is 50 to 55%. The  left ventricle has low normal function. The left ventricle demonstrates  regional wall motion abnormalities (see scoring diagram/findings for  description). There is mild asymmetric  left ventricular hypertrophy of the basal and septal segments. Left  ventricular diastolic parameters were normal.   2. Right ventricular systolic function is normal. The right ventricular  size is normal.   3. Left atrial size was moderately dilated.   4. The mitral valve is degenerative. Trivial mitral valve regurgitation.  No evidence of mitral stenosis. Severe mitral annular calcification.   5. The aortic valve is tricuspid. Aortic valve regurgitation is not  visualized. Mild to moderate aortic valve sclerosis/calcification is  present, without any evidence of aortic stenosis.   6. The inferior vena cava is normal in  size with greater than 50%  respiratory variability, suggesting right atrial pressure of 3 mmHg.    Comparison(s): There was moderate concentric hypertrophy. Systolic  function was mildly to moderately reduced. The estimated ejection fraction  was in the range of 40% to 45%. Diffuse hypokinesis.   FINDINGS   Left Ventricle: Distal septal hypokinesis. Left ventricular ejection  fraction, by estimation, is 50 to 55%. The left ventricle has low normal  function. The left ventricle demonstrates regional wall motion  abnormalities. The left ventricular internal  cavity size was normal in size. There is mild asymmetric left ventricular  hypertrophy of the basal and septal segments. Left ventricular diastolic  parameters were normal.   Right Ventricle: The right ventricular size is normal. No increase in  right ventricular wall thickness. Right ventricular systolic function is  normal.   Left Atrium: Left atrial size was moderately dilated.   Right Atrium: Right atrial size was normal in size.   Pericardium: There is no evidence of pericardial effusion.   Mitral Valve: The mitral valve is degenerative in appearance. There is  moderate thickening of the mitral valve leaflet(s). There is moderate  calcification of the mitral valve leaflet(s). Severe mitral annular  calcification. Trivial mitral valve  regurgitation. No evidence of mitral valve stenosis.   Tricuspid Valve: The tricuspid valve is normal in structure. Tricuspid  valve regurgitation is not demonstrated. No evidence of tricuspid  stenosis.   Aortic Valve: The aortic valve is tricuspid. Aortic valve regurgitation is  not visualized. Mild to moderate aortic valve sclerosis/calcification is  present, without any evidence of aortic stenosis.   Pulmonic Valve: The pulmonic valve was normal in structure. Pulmonic valve  regurgitation is not visualized. No evidence of pulmonic stenosis.   Aorta: The aortic root is normal in size and structure.   Venous: The inferior vena cava is normal in size with greater than 50%   respiratory variability, suggesting right atrial pressure of 3 mmHg.   IAS/Shunts: No atrial level shunt detected by color flow Doppler.    Laboratory Data:  High Sensitivity Troponin:   Recent Labs  Lab 09/25/21 1030 09/25/21 1210  TROPONINIHS 80* 96*     Chemistry Recent Labs  Lab 09/25/21 1030  NA 139  K 3.4*  CL 105  CO2 22  GLUCOSE 160*  BUN 20  CREATININE 0.93  CALCIUM 8.5*  GFRNONAA >60  ANIONGAP 12    Recent Labs  Lab 09/25/21 1030  PROT 5.8*  ALBUMIN 3.2*  AST 28  ALT 20  ALKPHOS 83  BILITOT 0.5   Lipids No results for input(s): "CHOL", "TRIG", "HDL", "LABVLDL", "LDLCALC", "CHOLHDL" in the last 168 hours.  Hematology Recent Labs  Lab 09/25/21 1030  WBC 10.0  RBC 4.32  HGB 11.7*  HCT 36.3*  MCV 84.0  MCH 27.1  MCHC 32.2  RDW 15.1  PLT 297   Thyroid No results for input(s): "TSH", "FREET4" in the last 168 hours.  BNPNo results for input(s): "BNP", "PROBNP" in the last 168 hours.  DDimer No results for input(s): "DDIMER" in the last 168 hours.   Radiology/Studies:  DG Chest Port 1 View  Result Date: 09/25/2021 CLINICAL DATA:  Chest pain EXAM: PORTABLE CHEST 1 VIEW COMPARISON:  05/03/2017 FINDINGS: Prior CABG. Cardiomegaly. Vascular congestion. Increased interstitial markings peripherally and in the lung bases are similar prior study. Favor chronic lung disease although a component of edema cannot be completely excluded. No effusions or acute bony abnormality. IMPRESSION: Cardiomegaly, vascular congestion. Continued interstitial  prominence peripherally and in the lung bases, chronic lung disease most likely. Cannot exclude superimposed edema. Electronically Signed   By: Rolm Baptise M.D.   On: 09/25/2021 10:43     Assessment and Plan:   Gary [name] is a 76 y.o. male with a hx of hypertension, melanoma, CAD s/p PCI (stent to distal RCA 2003, stents to RCA and LAD in 2004, stent to proximal mid and distal LAD 2005) and CABG (2006), mild  ascending aortic aneurysm, chronic systolic and diastolic heart failure, hyperlipidemia, paroxysmal atrial fibrillation, and OSA (not compliant with CPAP due to sinus issues) who is being seen 09/25/2021 for the evaluation of atypical chest pain and elevated troponin at the request of Dr. Maryan Rued.  Atypical chest pain CAD s/p PCI and CABG  Patient reporting intermittent chest pain since early September, described as sharp, in the left side of his chest/axillary region. Pain radiates to left shoulder and also reportedly his mouth/teeth. This morning pain was more severe and radiated into his neck, like previous CAD chest pain, prompting call to cardiology office and ultimately EMS transport to the ED. ECG today shows T wave inversions in leads II, III and aVF that were not apparent on 9/8 office ECG.  Troponin in the emergency department 80, then 96.  Although patient's symptoms are atypical for cardiac etiology, cannot completely rule out unstable angina.  Will obtain additional troponin values to better ascertain trend.  Repeat echocardiogram ordered to assess for wall motion abnormality. Given significant CAD history and risk factors, will order Lexiscan. NPO at midnight.  Chronic systolic and diastolic HF  Patient is euvolemic on exam and denies recent symptoms of heart failure exacerbation including peripheral edema or weight gain..  EF noted to be improved to 50 to 55% on September 2022 echo.  Patient continues with daily hydrochlorothiazide.  Hypertension  Patient reports well-controlled blood pressures at home.  Moderately hypertensive in the emergency department today.  Patient confirms taking all regularly prescribed medicines this morning prior to EMS transport to the hospital.  Continue amlodipine 2.5 mg once daily, Coreg 25 mg twice daily, hydrochlorothiazide 12.5 mg once daily, losartan 100 mg once daily.  Paroxysmal atrial fibrillation  Patient on warfarin denies recent symptoms  of atrial fibrillation including palpitations.  Continue Coreg 25 mg twice daily along with warfarin.  Hyperlipidemia  Patient on PCSK9, Repatha.  Ascending aortic aneurysm  Ascending aortic aneurysm, 4.0 cm noted in 2016 ultrasound.  Repeat echo in September 2022 revealed normal aorta size and structure.  Granulomatosis with polyangiitis  Patient continues to be followed closely by rheumatology.  Receives a rituximab injection every 6 months.  Obstructive sleep apnea  Patient confirms that he is not using a CPAP. Per outpatient office visit note from 9/8, patient agreeable to repeat sleep study which was ordered.  Concern for GERD  Continue daily Protonix and Pepcid as started by PCP.  Hypothyroidism  Continue daily levothyroxine 137 mcg.  Diabetes, type II  Patient on metformin '500mg'$  BID. Reports plans to start Ozempic are on hold pending resolution of GI issues. SSI ordered.  Risk Assessment/Risk Scores:     TIMI Risk Score for Unstable Angina or Non-ST Elevation MI:   The patient's TIMI risk score is 5, which indicates a 26% risk of all cause mortality, new or recurrent myocardial infarction or need for urgent revascularization in the next 14 days.    CHA2DS2-VASc Score = 6   This indicates a 9.7% annual risk of stroke. The patient's  score is based upon: CHF History: 1 HTN History: 1 Diabetes History: 1 Stroke History: 0 Vascular Disease History: 1 Age Score: 2 Gender Score: 0         For questions or updates, please contact [place] Please consult [email or web address] for contact info under    Signed, Lily Kocher, PA-C  09/25/2021 4:48 PM   Patient seen and examined. Agree with assessment and plan.  Gary [name] is a very pleasant 76 year old gentleman who was a former patient of Dr. Quay Burow.  Patient has established CAD and had undergone prior stenting to his RCA and LAD at multiple sites and eventually required CABG  revascularization surgery for in-stent restenosis.  History of paroxysmal atrial fibrillation and has been on warfarin anticoagulation, history of hyper lipidemia, as well as chronic systolic and diastolic heart failure.  He has a history of OSA but has not been utilizing CPAP therapy.  Most recently he has been followed by Dr. [name] for Cardiologic care.  Patient recently has noticed indigestion-like symptoms and particularly would experience this when he would lie on his right side.  He had recently seen Finis Bud, nurse practitioner and ECG was without acute ischemic changes.  Yesterday he was evaluated by an APP at his primary care office and due to concern for increased GERD his GI regimen was changed to Protonix and Pepcid.  This morning, the patient experienced some recurrent left-sided discomfort in the region of the lateral pectoral muscle but seem to radiate to his neck and under the crown of the lower tooth.  He felt this may have been similar to remote cardiac pain.  He called the office and was advised to come to the ER.  Presently, he is pain-free.  He is in no acute distress.  Blood pressure is stable.  Telemetry reveals sinus rhythm in the 60s.  HEENT is unremarkable.  There is no JVD.  Lungs are clear.  There is no chest wall tenderness to palpation.  Rhythm is regular with faint 1/6 systolic murmur.  Abdomen was nontender.  There was no significant edema.  Troponins are minimally increased at 80 and 96.  His ECG shows sinus rhythm but there is new inferolateral T wave abnormality compared to a recent ECG.  Potassium is slightly low at 3.4.  Glucose is increased at 160.  He is mildly anemic with hemoglobin 11.7 hematocrit 36.3. Chest x-ray shows a megaly with interstitial prominence peripherally and at the lung bases.  He is on warfarin for anticoagulation.  My impression is that Gary [name] has known CAD status post prior multiple stents with ultimate CABG surgery 17 years ago.   His last nuclear perfusion study in 2016 was low risk and there was mild inferolateral defect.  Most recent echo Doppler study from 2022 showed EF 50 to 55% with wall motion abnormality.  Presently he is completely pain-free.  I have recommended he stay the night for observation with subsequent follow-up high-sensitivity troponin assessment.  I will also schedule him for a Lexiscan Myoview study in a.m. for reassessment of myocardial perfusion and potential for ischemia.  He does have dyspeptic symptoms and recommend continuance of recently started Protonix and Pepcid.  He is on Repatha for PCSK9 inhibition for aggressive lipid-lowering.  He has a history of sleep apnea but has not been using CPAP and may warrant a new evaluation.  He is diabetic on metformin and there has been plans to initiate his Ozempic.  We will keep n.p.o.  in a.m. for planned nuclear perfusion study.   Troy Sine, MD, [facility] 09/25/2021 5:29 PM

## 2021-09-25 NOTE — Progress Notes (Signed)
ANTICOAGULATION CONSULT NOTE - Initial Consult  Pharmacy Consult for Warfarin Indication: atrial fibrillation  Allergies  Allergen Reactions   Statins Other (See Comments)    Muscle pain    Patient Measurements:   Vital Signs: Temp: 98.6 F (37 C) (09/22 1615) Temp Source: Oral (09/22 1615) BP: 175/132 (09/22 1815) Pulse Rate: 65 (09/22 1815)  Labs: Recent Labs    09/24/21 0901 09/25/21 1030 09/25/21 1210 09/25/21 1632  HGB  --  11.7*  --   --   HCT  --  36.3*  --   --   PLT  --  297  --   --   INR 2.8  --   --   --   CREATININE  --  0.93  --   --   TROPONINIHS  --  80* 96* 165*     CrCl cannot be calculated (Unknown ideal weight.).   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 Patient is presenting with chest pain. Warfarin per pharmacy consult placed for atrial fibrillation. Patient is on chronic Bactrim treatment.  Patient taking  warfarin prior to arrival. Home dose is 2.5 mg (5 mg x 0.5) every Mon, Fri; 5 mg (5 mg x 1) all other days. Last taken 9/22 @ 0730.   INR yesterday was 2.8.  PT / INR today is 21.7 / 1.9, which is subtherapeutic Hgb 11.7; plt 297   Goal of Therapy:  INR Goal 2-3 Monitor platelets by anticoagulation protocol: Yes   Plan:  Warfarin 2.'5mg'$  x1 dose  F/u INR Monitor for s/s of hemorrhage, daily INR, CBC Watch for new DDIs- monitor with Bactrim use  Sandford Craze, PharmD. [facility] Acute Care PGY-1  09/25/2021 7:14 PM

## 2021-09-25 NOTE — Telephone Encounter (Signed)
Pt c/o of Chest Pain: STAT if CP now or developed within 24 hours  1. Are you having CP right now?   Yes  2. Are you experiencing any other symptoms (ex. SOB, nausea, vomiting, sweating)?   No  3. How long have you been experiencing CP?  Started only at night for two weeks  4. Is your CP continuous or coming and going?  Coming and going  5. Have you taken Nitroglycerin? Does not take ? Patient stated he is having pain under left arm toward his chest.  Patient stated he took 2 Pepcids around 7:30 am.

## 2021-09-25 NOTE — Telephone Encounter (Signed)
Spoke with patient regarding discomfort in armpit Patient has been having left armpit pain that radiates to left breast (about the size of his hand), left jaw pain, and now throat pain.  Pain is more intense at times.  Started Protonix 40 mg and Pepcid today given by PCP, no help.  Asked patient about discomfort when he had his CABG, stated throat pain at that time.  Advised patient given his history of CABG needs to call 911 to go to ED for evaluation.  Discussed with Dr Harrell Gave, agreeable with plan

## 2021-09-25 NOTE — ED Provider Notes (Signed)
[facility] EMERGENCY DEPARTMENT Provider Note   CSN: [id number] Arrival date & time: 09/25/21  0940     History  Chief Complaint  Patient presents with   Chest Pain    Gary [name] is a 76 y.o. male.  Pt is a 76y/o male with hx of CAD, s/p CABG and PCI (20 years ago), HLD, PAD, HTN, Paroxysmal A-fib, Chronic combined CHF (LVEF 50-55%), Wegener's disease who is presenting today with ongoing intermittent chest pain that has been off and on now since 8 September.  He had a follow-up with his cardiologist for a 1 year routine visit the day after this happened.  He reports he was laying in bed at night when he sat up to change positions when he got this deep aching pain in the left side of his breast that radiated to his left shoulder and to his left tooth.  It lasted approximately 2 hours and then did spontaneous resolve.  He reports that he did mention this when he was at his cardiology visit he reported they did an EKG and said everything looked fine and he went home.  He reports almost every night when he lies down he experiences the same discomfort and he will occasionally experience it intermittently throughout the day.  He has not done anything active really since this started because he has been fearful.  He does report a baseline dyspnea on exertion but reports that is been going on long before any of this started for at least the last year which he attributes to deconditioning because he stopped going to the gym.  He has followed up with his doctor just a few days ago and they initially were gena start Ozempic but because he has been having this discomfort with when he lies down they did not start [place] yet.  They did start him on Pepcid and Carafate.  He reports he took the medications and took the Carafate 30 minutes before eating today and had a bagel and approximately 30 minutes after having the bagel he started having more significant burning in the left side of  his chest that moved into his throat, his tooth and his left arm.  He denied any nausea or vomiting.  He had no diaphoresis.  No pain in the back or abdomen.  He felt that he was having a heart attack so he called 911.  When they arrived he took aspirin but reports now his symptoms are resolved.  He denies any recent fever or cough.  He has had no change in his bowel movements.  No prior abdominal surgeries.  He does report that he does not typically get pain after eating it is usually been when he tries to sit up or when he is lying down at night.  Currently he is chest pain-free.  The history is provided by the patient, medical records and the EMS personnel.  Chest Pain      Home Medications Prior to Admission medications   Medication Sig Start Date End Date Taking? Authorizing Provider  amLODipine (NORVASC) 2.5 MG tablet TAKE 1 TABLET BY MOUTH EVERY DAY 11/18/20   [name], MD  calcium carbonate (OS-CAL) 600 MG TABS tablet Take 600 mg by mouth daily with breakfast.    [provider]  carvedilol (COREG) 25 MG tablet TAKE 1 TABLET BY MOUTH TWICE A DAY 02/16/21   [name], MD  cetirizine (ZYRTEC) 10 MG tablet Take 10 mg by mouth daily.  [provider]  Cholecalciferol (VITAMIN D3) 125 MCG (5000 UT) TABS Take 5,000 Units by mouth daily.    [provider]  Coenzyme Q10 (CO Q 10) 100 MG CAPS Take 300 mg by mouth daily.     [provider]  hydrochlorothiazide (HYDRODIURIL) 12.5 MG tablet TAKE 1 TABLET BY MOUTH EVERY DAY 07/01/21   [name], MD  levothyroxine (SYNTHROID, LEVOTHROID) 137 MCG tablet Take 137-205.5 mcg by mouth See admin instructions. Take 205.5 mcg by mouth on Wednesday.  Take 137 mcg by mouth daily on all other days 10/27/14   [provider]  losartan (COZAAR) 100 MG tablet TAKE 1 TABLET BY MOUTH EVERY DAY 02/16/21   [name], MD  metFORMIN (GLUCOPHAGE) 500 MG tablet Take 500 mg by mouth 2 (two) times  daily. 10/27/17   [provider]  Multiple Vitamin (MULTIVITAMIN WITH MINERALS) TABS tablet Take 1 tablet by mouth daily. Centrum silver    [provider]  predniSONE (DELTASONE) 5 MG tablet Take 5 mg by mouth as needed (for wagoners).    [provider]  REPATHA SURECLICK 283 MG/ML SOAJ INJECT 140 MG INTO THE SKIN EVERY 14 (FOURTEEN) DAYS. INJECT 1 PEN INTO THE FATTY TISSUE SKIN EVERY 14 DAY 04/27/21   [name], MD  riTUXimab (RITUXAN IV) Inject 1 application into the vein every 6 (six) months.     [provider]  sodium chloride (OCEAN) 0.65 % SOLN nasal spray Place 1 spray into both nostrils daily as needed for congestion.     [provider]  sulfamethoxazole-trimethoprim (BACTRIM) 400-80 MG tablet Take 1 tablet by mouth daily.    [provider]  testosterone cypionate (DEPOTESTOSTERONE CYPIONATE) 200 MG/ML injection Inject 100 mg into the muscle every 14 (fourteen) days. 10/09/14   [provider]  venlafaxine XR (EFFEXOR-XR) 150 MG 24 hr capsule Take 150 mg by mouth daily.     [provider]  warfarin (COUMADIN) 5 MG tablet TAKE 1/2 TO 1 TABLET BY MOUTH EVERY DAY OR AS DIRECTED BY COUMADIN CLINIC 09/14/21   [name], MD      Allergies    Statins    Review of Systems   Review of Systems  Cardiovascular:  Positive for chest pain.    Physical Exam Updated Vital Signs BP 139/83   Pulse (!) 59   Temp 98.7 F (37.1 C)   Resp 14   SpO2 95%  Physical Exam Vitals and nursing note reviewed.  Constitutional:      General: He is not in acute distress.    Appearance: He is well-developed.  HENT:     Head: Normocephalic and atraumatic.  Eyes:     Conjunctiva/sclera: Conjunctivae normal.     Pupils: Pupils are equal, round, and reactive to light.  Cardiovascular:     Rate and Rhythm: Normal rate and regular rhythm. Frequent Extrasystoles are present.    Heart sounds: No murmur heard. Pulmonary:      Effort: Pulmonary effort is normal. No respiratory distress.     Breath sounds: Normal breath sounds. No wheezing or rales.  Abdominal:     General: There is no distension.     Palpations: Abdomen is soft.     Tenderness: There is no abdominal tenderness. There is no guarding or rebound.  Musculoskeletal:        General: No tenderness. Normal range of motion.     Cervical back: Normal range of motion and neck supple.  Right lower leg: No edema.     Left lower leg: No edema.  Skin:    General: Skin is warm and dry.     Findings: No erythema or rash.  Neurological:     Mental Status: He is alert and oriented to person, place, and time.  Psychiatric:        Behavior: Behavior normal.     ED Results / Procedures / Treatments   Labs (all labs ordered are listed, but only abnormal results are displayed) Labs Reviewed  CBC WITH DIFFERENTIAL/PLATELET - Abnormal; Notable for the following components:      Result Value   Hemoglobin 11.7 (*)    HCT 36.3 (*)    Neutro Abs 7.8 (*)    All other components within normal limits  COMPREHENSIVE METABOLIC PANEL - Abnormal; Notable for the following components:   Potassium 3.4 (*)    Glucose, Bld 160 (*)    Calcium 8.5 (*)    Total Protein 5.8 (*)    Albumin 3.2 (*)    All other components within normal limits  TROPONIN I (HIGH SENSITIVITY) - Abnormal; Notable for the following components:   Troponin I (High Sensitivity) 80 (*)    All other components within normal limits  LIPASE, BLOOD  TROPONIN I (HIGH SENSITIVITY)    EKG EKG Interpretation  Date/Time:  Friday September 25 2021 09:42:29 EDT Ventricular Rate:  63 PR Interval:  172 QRS Duration: 107 QT Interval:  421 QTC Calculation: 431 R Axis:   -5 Text Interpretation: Sinus rhythm Multiple premature complexes, vent & supraven Incomplete left bundle branch block Consider anterior infarct new inferolateral t-wave inversion different from 9/ 08/23 Confirmed by Blanchie Dessert [phone]) on 09/25/2021 9:53:41 AM  Radiology DG Chest Port 1 View  Result Date: 09/25/2021 CLINICAL DATA:  Chest pain EXAM: PORTABLE CHEST 1 VIEW COMPARISON:  05/03/2017 FINDINGS: Prior CABG. Cardiomegaly. Vascular congestion. Increased interstitial markings peripherally and in the lung bases are similar prior study. Favor chronic lung disease although a component of edema cannot be completely excluded. No effusions or acute bony abnormality. IMPRESSION: Cardiomegaly, vascular congestion. Continued interstitial prominence peripherally and in the lung bases, chronic lung disease most likely. Cannot exclude superimposed edema. Electronically Signed   By: Rolm Baptise M.D.   On: 09/25/2021 10:43    Procedures Procedures    Medications Ordered in ED Medications - No data to display  ED Course/ Medical Decision Making/ A&P                           Medical Decision Making Amount and/or Complexity of Data Reviewed Labs: ordered. Radiology: ordered.   Pt with multiple medical problems and comorbidities and presenting today with a complaint that caries a high risk for morbidity and mortality.  Here today complaining of episodic chest pain.  Patient's symptoms are a bit atypical for ACS as they seem to occur while he is lying down and also with position change and today occurred 30 minutes after eating.  This has been ongoing since 8 September.  He had been taking Prilosec but was changed to Pepcid and Carafate yesterday by his PCP.  Symptoms worsened today.  Currently the patient is pain-free.  He does have a significant cardiac history.  He has no infectious symptoms and no abdominal pain at this time.  Low suspicion for pancreatitis, hepatitis.  Possibility for GERD and esophagitis, cholelithiasis.  Also concern for atypical ACS versus angina.  I independently interpreted patient's EKG today and today patient does have some changes present as he now has T wave inversion inferior laterally  which is not present on his EKG 2 weeks ago.  Labs and x-ray are pending.  Patient was counseled to let us know if the pain returns and we will repeat EKG.  1:11 PM I independently interpreted patient's labs today and CBC and CMP without acute findings.  Lipase is within normal limits.  Troponin is elevated at 83 without recent old to compare.  We will do a delta troponin.  I have independently visualized and interpreted pt's images today.  Chest x-ray shows cardiomegaly with some mild congestion.  Patient still remains pain-free at this time and is well-appearing.  However given EKG changes today, Trop of 80 we will have cardiology consult on the patient.  Still possibility for GI pathology versus angina.          Final Clinical Impression(s) / ED Diagnoses Final diagnoses:  None    Rx / DC Orders ED Discharge Orders     None         Blanchie Dessert, MD 09/25/21 1544

## 2021-09-26 ENCOUNTER — Inpatient Hospital Stay (HOSPITAL_BASED_OUTPATIENT_CLINIC_OR_DEPARTMENT_OTHER): Payer: Medicare Other

## 2021-09-26 ENCOUNTER — Inpatient Hospital Stay (HOSPITAL_COMMUNITY): Payer: Medicare Other

## 2021-09-26 DIAGNOSIS — I214 Non-ST elevation (NSTEMI) myocardial infarction: Secondary | ICD-10-CM | POA: Diagnosis not present

## 2021-09-26 DIAGNOSIS — I259 Chronic ischemic heart disease, unspecified: Secondary | ICD-10-CM | POA: Diagnosis not present

## 2021-09-26 DIAGNOSIS — M3130 Wegener's granulomatosis without renal involvement: Secondary | ICD-10-CM | POA: Diagnosis not present

## 2021-09-26 DIAGNOSIS — R079 Chest pain, unspecified: Secondary | ICD-10-CM | POA: Diagnosis not present

## 2021-09-26 DIAGNOSIS — E1169 Type 2 diabetes mellitus with other specified complication: Secondary | ICD-10-CM | POA: Diagnosis not present

## 2021-09-26 DIAGNOSIS — I5042 Chronic combined systolic (congestive) and diastolic (congestive) heart failure: Secondary | ICD-10-CM | POA: Diagnosis not present

## 2021-09-26 LAB — BASIC METABOLIC PANEL
Anion gap: 11 (ref 5–15)
BUN: 21 mg/dL (ref 8–23)
CO2: 26 mmol/L (ref 22–32)
Calcium: 9.4 mg/dL (ref 8.9–10.3)
Chloride: 102 mmol/L (ref 98–111)
Creatinine, Ser: 0.88 mg/dL (ref 0.61–1.24)
GFR, Estimated: >60 mL/min (ref >60)
Glucose, Bld: 112 mg/dL — ABNORMAL HIGH (ref 70–99)
Potassium: 3.4 mmol/L — ABNORMAL LOW (ref 3.5–5.1)
Sodium: 139 mmol/L (ref 135–145)

## 2021-09-26 LAB — CBC
HCT: 37.2 % — ABNORMAL LOW (ref 39.0–52.0)
Hemoglobin: 12.3 g/dL — ABNORMAL LOW (ref 13.0–17.0)
MCH: 27.2 pg (ref 26.0–34.0)
MCHC: 33.1 g/dL (ref 30.0–36.0)
MCV: 82.1 fL (ref 80.0–100.0)
Platelets: 314 10*3/uL (ref 150–400)
RBC: 4.53 MIL/uL (ref 4.22–5.81)
RDW: 15.4 % (ref 11.5–15.5)
WBC: 10.2 10*3/uL (ref 4.0–10.5)
nRBC: 0 % (ref 0.0–0.2)

## 2021-09-26 LAB — HEPARIN LEVEL (UNFRACTIONATED): Heparin Unfractionated: 0.22 IU/mL — ABNORMAL LOW (ref 0.30–0.70)

## 2021-09-26 LAB — GLUCOSE, CAPILLARY
Glucose-Capillary: 120 mg/dL — ABNORMAL HIGH (ref 70–99)
Glucose-Capillary: 112 mg/dL — ABNORMAL HIGH (ref 70–99)
Glucose-Capillary: 103 mg/dL — ABNORMAL HIGH (ref 70–99)

## 2021-09-26 LAB — ECHOCARDIOGRAM COMPLETE
Area-P 1/2: 3 cm2
Height: 70 in
S' Lateral: 3.4 cm
Weight: 3947.2 oz

## 2021-09-26 LAB — PROTIME-INR
INR: 1.6 — ABNORMAL HIGH (ref 0.8–1.2)
Prothrombin Time: 18.4 seconds — ABNORMAL HIGH (ref 11.4–15.2)

## 2021-09-26 MED ORDER — HEPARIN (PORCINE) 25000 UT/250ML-% IV SOLN
1500.0000 [IU]/h | INTRAVENOUS | Status: DC
  Administered 2021-09-26: 1250 [IU]/h via INTRAVENOUS
  Administered 2021-09-27: 1450 [IU]/h via INTRAVENOUS
  Administered 2021-09-28: 1500 [IU]/h via INTRAVENOUS
  Filled 2021-09-26 (×4): qty 250

## 2021-09-26 MED ORDER — REGADENOSON 0.4 MG/5ML IV SOLN
0.4000 mg | Freq: Once | INTRAVENOUS | Status: DC
  Filled 2021-09-26: qty 5

## 2021-09-26 MED ORDER — TECHNETIUM TC 99M TETROFOSMIN IV KIT
10.6000 | PACK | Freq: Once | INTRAVENOUS | Status: AC | PRN
  Administered 2021-09-26: 10.6 via INTRAVENOUS

## 2021-09-26 MED ORDER — REGADENOSON 0.4 MG/5ML IV SOLN
INTRAVENOUS | Status: AC
  Filled 2021-09-26: qty 5

## 2021-09-26 NOTE — Progress Notes (Addendum)
Rounding Note    Patient Name: Gary [name] Date of Encounter: 09/26/2021  [place] Cardiologist: [name], MD   Subjective   Had a small amt of chest pain, but has generally done well.  Inpatient Medications    Scheduled Meds:  amLODipine  2.5 mg Oral Daily   carvedilol  25 mg Oral BID   hydrochlorothiazide  12.5 mg Oral Daily   levothyroxine  137 mcg Oral Once per day on Sun Mon Tue Thu Fri Sat   [START ON 09/30/2021] levothyroxine  205.5 mcg Oral Once per day on Wed   losartan  100 mg Oral Daily   regadenoson  0.4 mg Intravenous Once   sulfamethoxazole-trimethoprim  1 tablet Oral Daily   venlafaxine XR  150 mg Oral Daily   Warfarin - Pharmacist Dosing Inpatient   Does not apply q1600   Continuous Infusions:  PRN Meds: acetaminophen, nitroGLYCERIN, ondansetron (ZOFRAN) IV   Vital Signs    Vitals:   09/25/21 2300 09/26/21 0631 09/26/21 0834 09/26/21 1044  BP: 133/76 (!) 150/70 (!) 147/81 134/78  Pulse: 64 61 61   Resp:  16 17   Temp:  97.7 F (36.5 C) 97.8 F (36.6 C)   TempSrc:  Oral Oral   SpO2: 96%     Weight:      Height:        Intake/Output Summary (Last 24 hours) at 09/26/2021 1110 Last data filed at 09/26/2021 0000 Gross per 24 hour  Intake 360 ml  Output --  Net 360 ml      09/25/2021   10:20 PM 09/11/2021    9:09 AM 09/12/2020    9:18 AM  Last 3 Weights  Weight (lbs) 246 lb 11.2 oz 251 lb 245 lb 3.2 oz  Weight (kg) 111.902 kg 113.853 kg 111.222 kg      Telemetry    SR - Personally Reviewed  ECG    Sinus brady, HR 57 - Personally Reviewed  Physical Exam   GEN: No acute distress.   Neck: No JVD Cardiac: RRR, no murmurs, rubs, or gallops.  Respiratory: Clear to auscultation bilaterally. GI: Soft, nontender, non-distended  MS: No edema; No deformity. Neuro:  Nonfocal  Psych: Normal affect   Labs    High Sensitivity Troponin:   Recent Labs  Lab 09/25/21 1030 09/25/21 1210 09/25/21 1632 09/25/21 1800   TROPONINIHS 80* 96* 165* 219*     Chemistry Recent Labs  Lab 09/25/21 1030 09/26/21 0344  NA 139 139  K 3.4* 3.4*  CL 105 102  CO2 22 26  GLUCOSE 160* 112*  BUN 20 21  CREATININE 0.93 0.88  CALCIUM 8.5* 9.4  PROT 5.8*  --   ALBUMIN 3.2*  --   AST 28  --   ALT 20  --   ALKPHOS 83  --   BILITOT 0.5  --   GFRNONAA >60 >60  ANIONGAP 12 11    Lipids  Lab Results  Component Value Date   CHOL 155 05/26/2020   HDL 45 05/26/2020   LDLCALC 79 05/26/2020   TRIG 184 (H) 05/26/2020   CHOLHDL 3.4 05/26/2020     Hematology Recent Labs  Lab 09/25/21 1030 09/26/21 0344  WBC 10.0 10.2  RBC 4.32 4.53  HGB 11.7* 12.3*  HCT 36.3* 37.2*  MCV 84.0 82.1  MCH 27.1 27.2  MCHC 32.2 33.1  RDW 15.1 15.4  PLT 297 314   Thyroid No results for input(s): "TSH", "FREET4" in the  last 168 hours.  BNPNo results for input(s): "BNP", "PROBNP" in the last 168 hours.  DDimer No results for input(s): "DDIMER" in the last 168 hours.   Radiology    DG Chest Port 1 View  Result Date: 09/25/2021 CLINICAL DATA:  Chest pain EXAM: PORTABLE CHEST 1 VIEW COMPARISON:  05/03/2017 FINDINGS: Prior CABG. Cardiomegaly. Vascular congestion. Increased interstitial markings peripherally and in the lung bases are similar prior study. Favor chronic lung disease although a component of edema cannot be completely excluded. No effusions or acute bony abnormality. IMPRESSION: Cardiomegaly, vascular congestion. Continued interstitial prominence peripherally and in the lung bases, chronic lung disease most likely. Cannot exclude superimposed edema. Electronically Signed   By: Rolm Baptise M.D.   On: 09/25/2021 10:43    Cardiac Studies   ECHO: scheduled  Patient Profile     76 y.o. male with a hx of hypertension, melanoma, CAD s/p PCI (stent to distal RCA 2003, stents to RCA and LAD in 2004, stent to proximal mid and distal LAD 2005) and CABG (2006), mild ascending aortic aneurysm, chronic systolic and diastolic heart  failure, hyperlipidemia, paroxysmal atrial fibrillation, and OSA (not compliant with CPAP due to sinus issues) who was admitted 09/25/2021 for atypical chest pain and elevated troponin.  Assessment & Plan    NSTEMI - Ez continued to trend up overnight - he had a minor re-occurrence of pain, and with trop > 200, will cancel MV and schedule for cath - add heparin - put on board for cath on Monday, f/u on echo - Cardiac catheterization was discussed with the patient fully. The patient understands that risks include but are not limited to stroke (1 in 1000), death (1 in 69), kidney failure [usually temporary] (1 in 500), bleeding (1 in 200), allergic reaction [possibly serious] (1 in 200).  The patient understands and is willing to proceed.     For questions or updates, please contact [place] Please consult [email or web address] for contact info under        Signed, Rosaria Ferries, PA-C  09/26/2021, 11:10 AM  \  Patient examined chart reviewed Seen in nuclear. Had some SSCP this am Troponin continues to rise ECG with inferior T wave changes Multiple previous stents to RCA and LAD. Favor diagnostic cath on Monday not nuclear study Start heparin Risks including stroke, MI, bleeding intubation and emergency surgery discussed willing to proceed. Orders done on board for Monday   Jenkins Rouge MD [facility]

## 2021-09-26 NOTE — Progress Notes (Signed)
ANTICOAGULATION CONSULT NOTE - Initial Consult  Pharmacy Consult for heparin Indication: chest pain/ACS  Allergies  Allergen Reactions   Statins Other (See Comments)    Muscle pain    Patient Measurements: Height: '5\' 10"'$  (177.8 cm) Weight: 111.9 kg (246 lb 11.2 oz) IBW/kg (Calculated) : 73 Heparin Dosing Weight: 97.4kg  Vital Signs: Temp: 97.8 F (36.6 C) (09/23 0834) Temp Source: Oral (09/23 0834) BP: 134/78 (09/23 1044) Pulse Rate: 61 (09/23 0834)  Labs: Recent Labs    09/24/21 0901 09/25/21 1030 09/25/21 1030 09/25/21 1210 09/25/21 1632 09/25/21 1800 09/26/21 0344  HGB  --  11.7*  --   --   --   --  12.3*  HCT  --  36.3*  --   --   --   --  37.2*  PLT  --  297  --   --   --   --  314  LABPROT  --   --   --   --   --  21.7*  --   INR 2.8  --   --   --   --  1.9*  --   CREATININE  --  0.93  --   --   --   --  0.88  TROPONINIHS  --  80*   < > 96* 165* 219*  --    < > = values in this interval not displayed.    Estimated Creatinine Clearance: 86.7 mL/min (by C-G formula based on SCr of 0.88 mg/dL).   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   09/25/2021   carvedilol (COREG) 25 MG tablet TAKE 1 TABLET  BY MOUTH TWICE A DAY 180 tablet 3 09/25/2021 at 0730   cetirizine (ZYRTEC) 10 MG tablet Take 10 mg by mouth daily.   09/25/2021   Cholecalciferol (VITAMIN D3) 125 MCG (5000 UT) TABS Take 5,000 Units by mouth daily.   09/25/2021   Coenzyme Q10 (CO Q 10) 100 MG CAPS Take 300 mg by mouth daily.    09/25/2021   famotidine (PEPCID) 20 MG tablet Take 20 mg by mouth 2 (two) times daily.   09/25/2021   fluticasone (FLONASE ALLERGY RELIEF) 50 MCG/ACT nasal spray 1 spray in each nostril Nasally Once a day for 30 day(s)   unk   hydrochlorothiazide (HYDRODIURIL) 12.5 MG tablet TAKE 1 TABLET BY MOUTH EVERY DAY 90 tablet 1 09/25/2021   levothyroxine (SYNTHROID, LEVOTHROID) 137 MCG tablet Take 137-205.5 mcg by mouth See admin instructions. Take 205.5 mcg by mouth on Wednesday.  Take 137 mcg by mouth daily on all other days   09/25/2021   losartan (COZAAR) 100  MG tablet TAKE 1 TABLET BY MOUTH EVERY DAY 90 tablet 3 09/25/2021   metFORMIN (GLUCOPHAGE) 500 MG tablet Take 500 mg by mouth 2 (two) times daily.  12 09/25/2021   Multiple Vitamin (MULTIVITAMIN WITH MINERALS) TABS tablet Take 1 tablet by mouth daily. Centrum silver   09/24/2021   pantoprazole (PROTONIX) 40 MG tablet Take 40 mg by mouth 2 (two) times daily.   09/25/2021   predniSONE (DELTASONE) 5 MG tablet Take 5 mg by mouth as needed (for wagoners).   8/32/9191   REPATHA SURECLICK 660 MG/ML SOAJ INJECT 140 MG INTO THE SKIN EVERY 14 (FOURTEEN) DAYS. INJECT 1 PEN INTO THE FATTY TISSUE SKIN EVERY 14 DAY 6 mL 3 Past Month   riTUXimab (RITUXAN IV) Inject 1 application into the vein every 6 (six) months.    Past Month   sodium chloride (OCEAN) 0.65 % SOLN nasal spray Place 1 spray into both nostrils daily as needed for congestion.    unk   sulfamethoxazole-trimethoprim (BACTRIM) 400-80 MG tablet Take 1 tablet by mouth daily.   09/25/2021   testosterone cypionate (DEPOTESTOSTERONE CYPIONATE) 200 MG/ML injection Inject 100 mg into the muscle every 14 (fourteen) days.   Past  Week   venlafaxine XR (EFFEXOR-XR) 150 MG 24 hr capsule Take 150 mg by mouth daily.    09/25/2021   warfarin (COUMADIN) 5 MG tablet TAKE 1/2 TO 1 TABLET BY MOUTH EVERY DAY OR AS DIRECTED BY COUMADIN CLINIC 90 tablet 0 09/25/2021 at 0730   cephALEXin (KEFLEX) 500 MG capsule Take by mouth every 8 (eight) hours. (Patient not taking: Reported on 09/25/2021)   Completed Course   doxycycline (VIBRAMYCIN) 100 MG capsule Take 100 mg by mouth 2 (two) times daily. (Patient not taking: Reported on 09/25/2021)   Completed Course   Scheduled:   amLODipine  2.5 mg Oral Daily   carvedilol  25 mg Oral BID   hydrochlorothiazide  12.5 mg Oral Daily   levothyroxine  137 mcg Oral Once per day on Sun Mon Tue Thu Fri Sat   [START ON 09/30/2021] levothyroxine  205.5 mcg Oral Once per day on Wed   losartan  100 mg Oral Daily   regadenoson  0.4 mg Intravenous Once   sulfamethoxazole-trimethoprim  1 tablet Oral Daily   venlafaxine XR  150 mg Oral Daily   Warfarin - Pharmacist Dosing Inpatient   Does not apply q1600   Assessment: 8 yom with a history of HTN, melanoma, CAD s/p PCI, mild ascending aortic aneurysm, HF, HLD, AF on coumadin, OSA. Patient is presenting with chest pain. Pharmacy consulted to dose heparin for NSTEMI. Troponin continue to trend upwards and plan for cath lab for Monday. Patient had last dose of warfarin 2.5 on 9/22 @ 2349. Last INR 1.9--> 1.6 today. Hgb low and trending up, plts WNL.  Goal of Therapy:  Heparin level 0.3-0.7 units/ml Monitor platelets by anticoagulation protocol: Yes  Plan:  Start heparin infusion at 1250 units/hr Check anti-Xa level in 8 hours and daily while on heparin Continue to monitor H&H and platelets  Sandford Craze, PharmD. [facility] Acute Care PGY-1  09/26/2021 11:49 AM

## 2021-09-26 NOTE — Progress Notes (Signed)
ANTICOAGULATION CONSULT NOTE  Pharmacy Consult for heparin Indication: chest pain/ACS  Allergies  Allergen Reactions   Statins Other (See Comments)    Muscle pain    Patient Measurements: Height: '5\' 10"'$  (177.8 cm) Weight: 111.9 kg (246 lb 11.2 oz) IBW/kg (Calculated) : 73 Heparin Dosing Weight: 97.4kg  Vital Signs: Temp: 98 F (36.7 C) (09/23 2011) Temp Source: Oral (09/23 2011) BP: 124/72 (09/23 2011) Pulse Rate: 63 (09/23 2011)  Labs: Recent Labs    09/24/21 0901 09/25/21 1030 09/25/21 1030 09/25/21 1210 09/25/21 1632 09/25/21 1800 09/26/21 0344 09/26/21 1222 09/26/21 2248  HGB  --  11.7*  --   --   --   --  12.3*  --   --   HCT  --  36.3*  --   --   --   --  37.2*  --   --   PLT  --  297  --   --   --   --  314  --   --   LABPROT  --   --   --   --   --  21.7*  --  18.4*  --   INR 2.8  --   --   --   --  1.9*  --  1.6*  --   HEPARINUNFRC  --   --   --   --   --   --   --   --  0.22*  CREATININE  --  0.93  --   --   --   --  0.88  --   --   TROPONINIHS  --  80*   < > 96* 165* 219*  --   --   --    < > = values in this interval not displayed.     Estimated Creatinine Clearance: 86.7 mL/min (by C-G formula based on SCr of 0.88 mg/dL).   Assessment: 57 yom with a history of HTN, melanoma, CAD s/p PCI, mild ascending aortic aneurysm, HF, HLD, AF on coumadin, OSA. Patient is presenting with chest pain. Pharmacy consulted to dose heparin for NSTEMI. Troponin continue to trend upwards and plan for cath lab for Monday. Patient had last dose of warfarin 2.5 on 9/22 @ 2349. Last INR 1.9--> 1.6 today.  Initial heparin level is sub-therapeutic at 0.22 units/mL.  No issue with heparin infusion nor bleeding per RN.  Goal of Therapy:  Heparin level 0.3-0.7 units/ml Monitor platelets by anticoagulation protocol: Yes  Plan:  Increase heparin infusion to 1450 units/hr Check anti-Xa level in 8 hours   Armine Rizzolo D. Mina Marble, PharmD, BCPS, [place] 09/26/2021, 11:41 PM

## 2021-09-26 NOTE — Progress Notes (Signed)
Patient nurse on Gary [name] notified stress portion of test cancelled and that he will have a heart cath on Monday as per Dr Johnsie Cancel

## 2021-09-26 NOTE — Progress Notes (Signed)
  Echocardiogram 2D Echocardiogram has been performed.  Gary [name] 09/26/2021, 12:32 PM

## 2021-09-26 NOTE — Progress Notes (Signed)
Test cancelled as troponins are elevated. Dr Johnsie Cancel in to talk to patient. Explained to patient that he will have a heart cath on Monday.

## 2021-09-27 DIAGNOSIS — I214 Non-ST elevation (NSTEMI) myocardial infarction: Secondary | ICD-10-CM | POA: Diagnosis not present

## 2021-09-27 DIAGNOSIS — E1169 Type 2 diabetes mellitus with other specified complication: Secondary | ICD-10-CM | POA: Diagnosis not present

## 2021-09-27 DIAGNOSIS — M3130 Wegener's granulomatosis without renal involvement: Secondary | ICD-10-CM | POA: Diagnosis not present

## 2021-09-27 DIAGNOSIS — R079 Chest pain, unspecified: Secondary | ICD-10-CM | POA: Diagnosis not present

## 2021-09-27 DIAGNOSIS — I5042 Chronic combined systolic (congestive) and diastolic (congestive) heart failure: Secondary | ICD-10-CM | POA: Diagnosis not present

## 2021-09-27 LAB — CBC
HCT: 39.2 % (ref 39.0–52.0)
Hemoglobin: 12.5 g/dL — ABNORMAL LOW (ref 13.0–17.0)
MCH: 26.7 pg (ref 26.0–34.0)
MCHC: 31.9 g/dL (ref 30.0–36.0)
MCV: 83.8 fL (ref 80.0–100.0)
Platelets: 336 10*3/uL (ref 150–400)
RBC: 4.68 MIL/uL (ref 4.22–5.81)
RDW: 15.3 % (ref 11.5–15.5)
WBC: 8.8 10*3/uL (ref 4.0–10.5)
nRBC: 0 % (ref 0.0–0.2)

## 2021-09-27 LAB — HEPARIN LEVEL (UNFRACTIONATED): Heparin Unfractionated: 0.3 IU/mL (ref 0.30–0.70)

## 2021-09-27 LAB — PROTIME-INR
INR: 1.5 — ABNORMAL HIGH (ref 0.8–1.2)
Prothrombin Time: 17.5 seconds — ABNORMAL HIGH (ref 11.4–15.2)

## 2021-09-27 MED ORDER — SODIUM CHLORIDE 0.9% FLUSH
3.0000 mL | Freq: Two times a day (BID) | INTRAVENOUS | Status: DC
  Administered 2021-09-27: 3 mL via INTRAVENOUS

## 2021-09-27 MED ORDER — SODIUM CHLORIDE 0.9 % WEIGHT BASED INFUSION: 1.0000 mL/kg/h | INTRAVENOUS | Status: DC

## 2021-09-27 MED ORDER — SODIUM CHLORIDE 0.9 % WEIGHT BASED INFUSION
3.0000 mL/kg/h | INTRAVENOUS | Status: DC
  Administered 2021-09-28: 3 mL/kg/h via INTRAVENOUS

## 2021-09-27 MED ORDER — SODIUM CHLORIDE 0.9% FLUSH: 3.0000 mL | INTRAVENOUS | Status: DC | PRN

## 2021-09-27 MED ORDER — ASPIRIN 81 MG PO CHEW
81.0000 mg | CHEWABLE_TABLET | ORAL | Status: AC
  Administered 2021-09-28: 81 mg via ORAL
  Filled 2021-09-27: qty 1

## 2021-09-27 MED ORDER — SODIUM CHLORIDE 0.9 % IV SOLN: 250.0000 mL | INTRAVENOUS | Status: DC | PRN

## 2021-09-27 NOTE — Progress Notes (Signed)
Rounding Note    Patient Name: Gary [name] Date of Encounter: 09/27/2021  [place] Cardiologist: [name], MD   Subjective  No pain on heparin   Inpatient Medications    Scheduled Meds:  amLODipine  2.5 mg Oral Daily   carvedilol  25 mg Oral BID   hydrochlorothiazide  12.5 mg Oral Daily   levothyroxine  137 mcg Oral Once per day on Sun Mon Tue Thu Fri Sat   [START ON 09/30/2021] levothyroxine  205.5 mcg Oral Once per day on Wed   losartan  100 mg Oral Daily   regadenoson  0.4 mg Intravenous Once   sulfamethoxazole-trimethoprim  1 tablet Oral Daily   venlafaxine XR  150 mg Oral Daily   Warfarin - Pharmacist Dosing Inpatient   Does not apply q1600   Continuous Infusions:  heparin 1,450 Units/hr (09/26/21 2350)   PRN Meds: acetaminophen, nitroGLYCERIN, ondansetron (ZOFRAN) IV   Vital Signs    Vitals:   09/26/21 2011 09/26/21 2353 09/27/21 0508 09/27/21 0552  BP: 124/72 112/60 (!) 142/73   Pulse: 63 (!) 53 60 66  Resp: _0 Temp: 98 F (36.7 C) 97.7 F (36.5 C) 97.9 F (36.6 C)   TempSrc: Oral Oral Oral   SpO2: 97% 95%  99%  Weight:      Height:        Intake/Output Summary (Last 24 hours) at 09/27/2021 0920 Last data filed at 09/27/2021 0553 Gross per 24 hour  Intake 381.66 ml  Output --  Net 381.66 ml      09/25/2021   10:20 PM 09/11/2021    9:09 AM 09/12/2020    9:18 AM  Last 3 Weights  Weight (lbs) 246 lb 11.2 oz 251 lb 245 lb 3.2 oz  Weight (kg) 111.902 kg 113.853 kg 111.222 kg      Telemetry    SR - Personally Reviewed  ECG    Sinus brady, HR 57 - Personally Reviewed  Physical Exam   GEN: No acute distress.   Neck: No JVD Cardiac: RRR, no murmurs, rubs, or gallops.  Respiratory: Clear to auscultation bilaterally. GI: Soft, nontender, non-distended  MS: No edema; No deformity. Neuro:  Nonfocal  Psych: Normal affect   Labs    High Sensitivity Troponin:   Recent Labs  Lab 09/25/21 1030 09/25/21 1210  09/25/21 1632 09/25/21 1800  TROPONINIHS 80* 96* 165* 219*     Chemistry Recent Labs  Lab 09/25/21 1030 09/26/21 0344  NA 139 139  K 3.4* 3.4*  CL 105 102  CO2 22 26  GLUCOSE 160* 112*  BUN 20 21  CREATININE 0.93 0.88  CALCIUM 8.5* 9.4  PROT 5.8*  --   ALBUMIN 3.2*  --   AST 28  --   ALT 20  --   ALKPHOS 83  --   BILITOT 0.5  --   GFRNONAA >60 >60  ANIONGAP 12 11    Lipids  Lab Results  Component Value Date   CHOL 155 05/26/2020   HDL 45 05/26/2020   LDLCALC 79 05/26/2020   TRIG 184 (H) 05/26/2020   CHOLHDL 3.4 05/26/2020     Hematology Recent Labs  Lab 09/25/21 1030 09/26/21 0344 09/27/21 0723  WBC 10.0 10.2 8.8  RBC 4.32 4.53 4.68  HGB 11.7* 12.3* 12.5*  HCT 36.3* 37.2* 39.2  MCV 84.0 82.1 83.8  MCH 27.1 27.2 26.7  MCHC 32.2 33.1 31.9  RDW 15.1 15.4 15.3  PLT 297  314 336   Thyroid No results for input(s): "TSH", "FREET4" in the last 168 hours.  BNPNo results for input(s): "BNP", "PROBNP" in the last 168 hours.  DDimer No results for input(s): "DDIMER" in the last 168 hours.   Radiology    ECHOCARDIOGRAM COMPLETE  Result Date: 09/26/2021    ECHOCARDIOGRAM REPORT   Patient Name:   KENDRIX ORMAN [facility] Date of Exam: 09/26/2021 Medical Rec #:  [id number]         Height:       70.0 in Accession #:    [id number]        Weight:       246.7 lb Date of Birth:  July 27, 1945          BSA:          2.282 m Patient Age:    76 years          BP:           134/78 mmHg Patient Gender: M                 HR:           58 bpm. Exam Location:  Inpatient Procedure: 2D Echo Indications:    chest pain  History:        Patient has prior history of Echocardiogram examinations, most                 recent 09/19/2020. CAD, Arrythmias:Atrial Fibrillation; Risk                 Factors:Hypertension, Dyslipidemia and Sleep Apnea.  Sonographer:    Johny Chess RDCS Referring Phys: [id number] [place]  1. Left ventricular ejection fraction, by estimation, is 50 to 55%. The  left ventricle has low normal function. The left ventricle has no regional wall motion abnormalities. There is moderate left ventricular hypertrophy. Left ventricular diastolic parameters are consistent with Grade II diastolic dysfunction (pseudonormalization).  2. Right ventricular systolic function is normal. The right ventricular size is normal. There is normal pulmonary artery systolic pressure. The estimated right ventricular systolic pressure is 42.3 mmHg.  3. No evidence of mitral valve regurgitation. Moderate to severe mitral annular calcification.  4. There is mild calcification of the aortic valve. Aortic valve regurgitation is not visualized. No aortic stenosis is present.  5. The inferior vena cava is normal in size with greater than 50% respiratory variability, suggesting right atrial pressure of 3 mmHg. Comparison(s): No significant change from prior study. FINDINGS  Left Ventricle: Left ventricular ejection fraction, by estimation, is 50 to 55%. The left ventricle has low normal function. The left ventricle has no regional wall motion abnormalities. The left ventricular internal cavity size was normal in size. There is moderate left ventricular hypertrophy. Left ventricular diastolic parameters are consistent with Grade II diastolic dysfunction (pseudonormalization). Right Ventricle: The right ventricular size is normal. No increase in right ventricular wall thickness. Right ventricular systolic function is normal. There is normal pulmonary artery systolic pressure. The tricuspid regurgitant velocity is 2.56 m/s, and  with an assumed right atrial pressure of 3 mmHg, the estimated right ventricular systolic pressure is 53.6 mmHg. Left Atrium: Left atrial size was normal in size. Right Atrium: Right atrial size was normal in size. Pericardium: There is no evidence of pericardial effusion. Mitral Valve: Moderate to severe mitral annular calcification. No evidence of mitral valve regurgitation. Tricuspid  Valve: The tricuspid valve is normal in structure. Tricuspid valve regurgitation is not demonstrated. Aortic  Valve: There is mild calcification of the aortic valve. Aortic valve regurgitation is not visualized. No aortic stenosis is present. Pulmonic Valve: The pulmonic valve was normal in structure. Pulmonic valve regurgitation is not visualized. Aorta: The aortic root and ascending aorta are structurally normal, with no evidence of dilitation. Venous: The inferior vena cava is normal in size with greater than 50% respiratory variability, suggesting right atrial pressure of 3 mmHg. IAS/Shunts: No atrial level shunt detected by color flow Doppler.  LEFT VENTRICLE PLAX 2D LVIDd:         5.60 cm   Diastology LVIDs:         3.40 cm   LV e' medial:    4.13 cm/s LV PW:         1.60 cm   LV E/e' medial:  24.0 LV IVS:        1.20 cm   LV e' lateral:   7.94 cm/s LVOT diam:     2.20 cm   LV E/e' lateral: 12.5 LV SV:         86 LV SV Index:   38        2D Longitudinal Strain LVOT Area:     3.80 cm  2D Strain GLS Avg:     -9.8 %  RIGHT VENTRICLE             IVC RV S prime:     11.20 cm/s  IVC diam: 2.00 cm TAPSE (M-mode): 1.4 cm LEFT ATRIUM             Index        RIGHT ATRIUM           Index LA diam:        5.50 cm 2.41 cm/m   RA Area:     16.20 cm LA Vol (A2C):   61.1 ml 26.77 ml/m  RA Volume:   39.60 ml  17.35 ml/m LA Vol (A4C):   73.6 ml 32.25 ml/m LA Biplane Vol: 71.3 ml 31.24 ml/m  AORTIC VALVE LVOT Vmax:   105.00 cm/s LVOT Vmean:  66.900 cm/s LVOT VTI:    0.227 m  AORTA Ao Root diam: 3.00 cm Ao Asc diam:  3.60 cm MITRAL VALVE               TRICUSPID VALVE MV Area (PHT): 3.00 cm    TR Peak grad:   26.2 mmHg MV Decel Time: 253 msec    TR Vmax:        256.00 cm/s MV E velocity: 99.20 cm/s MV A velocity: 40.20 cm/s  SHUNTS MV E/A ratio:  2.47        Systemic VTI:  0.23 m                            Systemic Diam: 2.20 cm Phineas Inches Electronically signed by Phineas Inches Signature Date/Time: 09/26/2021/1:23:02 PM     Final    DG Chest Port 1 View  Result Date: 09/25/2021 CLINICAL DATA:  Chest pain EXAM: PORTABLE CHEST 1 VIEW COMPARISON:  05/03/2017 FINDINGS: Prior CABG. Cardiomegaly. Vascular congestion. Increased interstitial markings peripherally and in the lung bases are similar prior study. Favor chronic lung disease although a component of edema cannot be completely excluded. No effusions or acute bony abnormality. IMPRESSION: Cardiomegaly, vascular congestion. Continued interstitial prominence peripherally and in the lung bases, chronic lung disease most likely. Cannot exclude superimposed edema. Electronically Signed   By:  Rolm Baptise M.D.   On: 09/25/2021 10:43    Cardiac Studies   ECHO: EF 50-55% MAC / AV sclerosis   Patient Profile     76 y.o. male with a hx of hypertension, melanoma, CAD s/p PCI (stent to distal RCA 2003, stents to RCA and LAD in 2004, stent to proximal mid and distal LAD 2005) and CABG (2006), mild ascending aortic aneurysm, chronic systolic and diastolic heart failure, hyperlipidemia, paroxysmal atrial fibrillation, and OSA (not compliant with CPAP due to sinus issues) who was admitted 09/25/2021 for atypical chest pain and elevated troponin.  Assessment & Plan    NSTEMI - Ez continued to trend up overnight - he had a minor re-occurrence of pain, and with trop > 200,  cath on Monday  - add heparin - put on board for cath on Monday, EF 50-55% on TTE  - Cardiac catheterization was discussed with the patient fully. The patient understands that risks include but are not limited to stroke (1 in 1000), death (1 in 54), kidney failure [usually temporary] (1 in 500), bleeding (1 in 200), allergic reaction [possibly serious] (1 in 200).  The patient understands and is willing to proceed.     For questions or updates, please contact [place] Please consult [email or web address] for contact info under      Signed, Jenkins Rouge, MD  09/27/2021, 9:20 AM  \

## 2021-09-27 NOTE — Plan of Care (Signed)
  Problem: Clinical Measurements: Goal: Respiratory complications will improve Outcome: Progressing Goal: Cardiovascular complication will be avoided Outcome: Progressing   Problem: Nutrition: Goal: Adequate nutrition will be maintained Outcome: Progressing   Problem: Coping: Goal: Level of anxiety will decrease Outcome: Progressing   Problem: Elimination: Goal: Will not experience complications related to urinary retention Outcome: Progressing   Problem: Pain Managment: Goal: General experience of comfort will improve Outcome: Progressing   Problem: Safety: Goal: Ability to remain free from injury will improve Outcome: Progressing

## 2021-09-27 NOTE — Progress Notes (Signed)
ANTICOAGULATION CONSULT NOTE - Follow Up  Pharmacy Consult for heparin Indication: chest pain/ACS  Allergies  Allergen Reactions   Statins Other (See Comments)    Muscle pain    Patient Measurements: Height: '5\' 10"'$  (177.8 cm) Weight: 111.9 kg (246 lb 11.2 oz) IBW/kg (Calculated) : 73 Heparin Dosing Weight: 97.4kg  Vital Signs: Temp: 97.9 F (36.6 C) (09/24 0508) Temp Source: Oral (09/24 0508) BP: 118/61 (09/24 0925) Pulse Rate: 66 (09/24 0552)  Labs: Recent Labs    09/25/21 1030 09/25/21 1210 09/25/21 1632 09/25/21 1800 09/26/21 0344 09/26/21 1222 09/26/21 2248 09/27/21 0723  HGB 11.7*  --   --   --  12.3*  --   --  12.5*  HCT 36.3*  --   --   --  37.2*  --   --  39.2  PLT 297  --   --   --  314  --   --  336  LABPROT  --   --   --  21.7*  --  18.4*  --  17.5*  INR  --   --   --  1.9*  --  1.6*  --  1.5*  HEPARINUNFRC  --   --   --   --   --   --  0.22* 0.30  CREATININE 0.93  --   --   --  0.88  --   --   --   TROPONINIHS 80* 96* 165* 219*  --   --   --   --      Estimated Creatinine Clearance: 86.7 mL/min (by C-G formula based on SCr of 0.88 mg/dL).   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    09/25/2021   carvedilol (COREG) 25 MG tablet TAKE 1 TABLET BY MOUTH TWICE A DAY 180 tablet 3 09/25/2021 at 0730   cetirizine (ZYRTEC) 10 MG tablet Take 10 mg by mouth daily.   09/25/2021   Cholecalciferol (VITAMIN D3) 125 MCG (5000 UT) TABS Take 5,000 Units by mouth daily.   09/25/2021   Coenzyme Q10 (CO Q 10) 100 MG CAPS Take 300 mg by mouth daily.    09/25/2021   famotidine (PEPCID) 20 MG tablet Take 20 mg by mouth 2 (two) times daily.   09/25/2021   fluticasone (FLONASE ALLERGY RELIEF) 50 MCG/ACT nasal spray 1 spray in each nostril Nasally Once a day for 30 day(s)   unk   hydrochlorothiazide (HYDRODIURIL) 12.5 MG tablet TAKE 1 TABLET BY MOUTH EVERY DAY 90 tablet 1 09/25/2021   levothyroxine (SYNTHROID, LEVOTHROID) 137 MCG tablet Take 137-205.5 mcg by mouth See admin instructions. Take 205.5 mcg by  mouth on Wednesday.  Take 137 mcg by mouth daily on all other days   09/25/2021   losartan (COZAAR) 100 MG tablet TAKE 1 TABLET BY MOUTH EVERY DAY 90 tablet 3 09/25/2021   metFORMIN (GLUCOPHAGE) 500 MG tablet Take 500 mg by mouth 2 (two) times daily.  12 09/25/2021   Multiple Vitamin (MULTIVITAMIN WITH MINERALS) TABS tablet Take 1 tablet by mouth daily. Centrum silver   09/24/2021   pantoprazole (PROTONIX) 40 MG tablet Take 40 mg by mouth 2 (two) times daily.   09/25/2021   predniSONE (DELTASONE) 5 MG tablet Take 5 mg by mouth as needed (for wagoners).   4/40/1027   REPATHA SURECLICK 253 MG/ML SOAJ INJECT 140 MG INTO THE SKIN EVERY 14 (FOURTEEN) DAYS. INJECT 1 PEN INTO THE FATTY TISSUE SKIN EVERY 14 DAY 6 mL 3 Past Month   riTUXimab (RITUXAN IV) Inject 1 application into the vein every 6 (six) months.    Past Month   sodium chloride (OCEAN) 0.65 % SOLN nasal spray Place 1 spray into both nostrils daily as needed for congestion.    unk   sulfamethoxazole-trimethoprim (BACTRIM) 400-80 MG tablet Take 1 tablet by mouth daily.   09/25/2021   testosterone cypionate (DEPOTESTOSTERONE CYPIONATE) 200 MG/ML injection Inject  100 mg into the muscle every 14 (fourteen) days.   Past Week   venlafaxine XR (EFFEXOR-XR) 150 MG 24 hr capsule Take 150 mg by mouth daily.    09/25/2021   warfarin (COUMADIN) 5 MG tablet TAKE 1/2 TO 1 TABLET BY MOUTH EVERY DAY OR AS DIRECTED BY COUMADIN CLINIC 90 tablet 0 09/25/2021 at 0730   cephALEXin (KEFLEX) 500 MG capsule Take by mouth every 8 (eight) hours. (Patient not taking: Reported on 09/25/2021)   Completed Course   doxycycline (VIBRAMYCIN) 100 MG capsule Take 100 mg by mouth 2 (two) times daily. (Patient not taking: Reported on 09/25/2021)   Completed Course   Scheduled:   amLODipine  2.5 mg Oral Daily   carvedilol  25 mg Oral BID   hydrochlorothiazide  12.5 mg Oral Daily   levothyroxine  137 mcg Oral Once per day on Sun Mon Tue Thu Fri Sat   [START ON 09/30/2021] levothyroxine  205.5 mcg Oral Once per day on Wed   losartan  100 mg Oral Daily   regadenoson  0.4 mg Intravenous Once   sulfamethoxazole-trimethoprim  1 tablet Oral Daily   venlafaxine XR  150 mg Oral Daily   Warfarin - Pharmacist Dosing Inpatient   Does not apply q1600   Assessment: 23 yom with a history of HTN, melanoma, CAD s/p PCI, mild ascending aortic aneurysm, HF, HLD, AF on coumadin, OSA. Patient is presenting with chest pain. Pharmacy consulted to dose heparin for NSTEMI. Troponin continue to trend upwards and plan for cath lab for Monday. Patient had last dose of warfarin 2.5 on 9/22 @ 2349. Last INR 1.9--> 1.6 today. Hgb low and trending up, plts WNL.  Heparin level at 0.3 (therapeutic). No issues with the infusion or signs/symptoms of bleeding documented  Goal of Therapy:  Heparin level 0.3-0.7 units/ml Monitor platelets by anticoagulation protocol: Yes  Plan:  Increase heparin infusion at 1500 units/hr Check heparin levels daily  Continue to monitor H&H and platelets  Sandford Craze, PharmD. [facility] Acute Care PGY-1  09/27/2021 12:49 PM

## 2021-09-28 ENCOUNTER — Encounter (HOSPITAL_COMMUNITY): Payer: Self-pay | Admitting: Cardiovascular Disease

## 2021-09-28 ENCOUNTER — Ambulatory Visit (HOSPITAL_COMMUNITY)
Admission: EM | Disposition: A | Discharge status: Home / Self Care | Payer: Self-pay | Source: Home / Self Care | Attending: Cardiovascular Disease

## 2021-09-28 DIAGNOSIS — E1169 Type 2 diabetes mellitus with other specified complication: Secondary | ICD-10-CM | POA: Diagnosis not present

## 2021-09-28 DIAGNOSIS — I214 Non-ST elevation (NSTEMI) myocardial infarction: Secondary | ICD-10-CM

## 2021-09-28 DIAGNOSIS — I25710 Atherosclerosis of autologous vein coronary artery bypass graft(s) with unstable angina pectoris: Secondary | ICD-10-CM | POA: Diagnosis not present

## 2021-09-28 DIAGNOSIS — I200 Unstable angina: Secondary | ICD-10-CM | POA: Diagnosis not present

## 2021-09-28 DIAGNOSIS — I5042 Chronic combined systolic (congestive) and diastolic (congestive) heart failure: Secondary | ICD-10-CM | POA: Diagnosis not present

## 2021-09-28 DIAGNOSIS — R079 Chest pain, unspecified: Secondary | ICD-10-CM | POA: Diagnosis not present

## 2021-09-28 DIAGNOSIS — M3130 Wegener's granulomatosis without renal involvement: Secondary | ICD-10-CM | POA: Diagnosis not present

## 2021-09-28 HISTORY — PX: CORONARY STENT INTERVENTION: CATH118234

## 2021-09-28 HISTORY — PX: LEFT HEART CATH AND CORS/GRAFTS ANGIOGRAPHY: CATH118250

## 2021-09-28 LAB — BASIC METABOLIC PANEL
Anion gap: 11 (ref 5–15)
BUN: 26 mg/dL — ABNORMAL HIGH (ref 8–23)
CO2: 24 mmol/L (ref 22–32)
Calcium: 9.7 mg/dL (ref 8.9–10.3)
Chloride: 105 mmol/L (ref 98–111)
Creatinine, Ser: 1.18 mg/dL (ref 0.61–1.24)
GFR, Estimated: >60 mL/min (ref >60)
Glucose, Bld: 151 mg/dL — ABNORMAL HIGH (ref 70–99)
Potassium: 3.5 mmol/L (ref 3.5–5.1)
Sodium: 140 mmol/L (ref 135–145)

## 2021-09-28 LAB — HEPARIN LEVEL (UNFRACTIONATED): Heparin Unfractionated: 0.41 IU/mL (ref 0.30–0.70)

## 2021-09-28 LAB — CBC
HCT: 38.2 % — ABNORMAL LOW (ref 39.0–52.0)
Hemoglobin: 12.3 g/dL — ABNORMAL LOW (ref 13.0–17.0)
MCH: 26.9 pg (ref 26.0–34.0)
MCHC: 32.2 g/dL (ref 30.0–36.0)
MCV: 83.4 fL (ref 80.0–100.0)
Platelets: 321 10*3/uL (ref 150–400)
RBC: 4.58 MIL/uL (ref 4.22–5.81)
RDW: 15.5 % (ref 11.5–15.5)
WBC: 8.8 10*3/uL (ref 4.0–10.5)
nRBC: 0 % (ref 0.0–0.2)

## 2021-09-28 LAB — GLUCOSE, CAPILLARY: Glucose-Capillary: 151 mg/dL — ABNORMAL HIGH (ref 70–99)

## 2021-09-28 LAB — LIPOPROTEIN A (LPA): Lipoprotein (a): 108.4 nmol/L — ABNORMAL HIGH (ref <75.0)

## 2021-09-28 LAB — POCT ACTIVATED CLOTTING TIME
Activated Clotting Time: 233 seconds
Activated Clotting Time: 203 seconds
Activated Clotting Time: 179 seconds

## 2021-09-28 LAB — PROTIME-INR
INR: 1.3 — ABNORMAL HIGH (ref 0.8–1.2)
Prothrombin Time: 16.3 seconds — ABNORMAL HIGH (ref 11.4–15.2)

## 2021-09-28 SURGERY — LEFT HEART CATH AND CORS/GRAFTS ANGIOGRAPHY
Anesthesia: LOCAL

## 2021-09-28 MED ORDER — MORPHINE SULFATE (PF) 2 MG/ML IV SOLN: 2.0000 mg | INTRAVENOUS | Status: DC | PRN

## 2021-09-28 MED ORDER — HYDRALAZINE HCL 20 MG/ML IJ SOLN: 10.0000 mg | INTRAMUSCULAR | Status: AC | PRN

## 2021-09-28 MED ORDER — HEPARIN (PORCINE) IN NACL 1000-0.9 UT/500ML-% IV SOLN
INTRAVENOUS | Status: DC | PRN
  Administered 2021-09-28 (×2): 500 mL

## 2021-09-28 MED ORDER — IOHEXOL 350 MG/ML SOLN
INTRAVENOUS | Status: DC | PRN
  Administered 2021-09-28: 145 mL

## 2021-09-28 MED ORDER — HEPARIN (PORCINE) IN NACL 1000-0.9 UT/500ML-% IV SOLN
INTRAVENOUS | Status: AC
  Filled 2021-09-28: qty 500

## 2021-09-28 MED ORDER — SODIUM CHLORIDE 0.9 % IV SOLN: 250.0000 mL | INTRAVENOUS | Status: DC | PRN

## 2021-09-28 MED ORDER — CLOPIDOGREL BISULFATE 75 MG PO TABS
75.0000 mg | ORAL_TABLET | Freq: Every day | ORAL | Status: DC
  Administered 2021-09-28 – 2021-09-29 (×2): 75 mg via ORAL
  Filled 2021-09-28 (×2): qty 1

## 2021-09-28 MED ORDER — FAMOTIDINE IN NACL 20-0.9 MG/50ML-% IV SOLN
INTRAVENOUS | Status: AC | PRN
  Administered 2021-09-28: 20 mg via INTRAVENOUS

## 2021-09-28 MED ORDER — FAMOTIDINE IN NACL 20-0.9 MG/50ML-% IV SOLN
INTRAVENOUS | Status: AC
  Filled 2021-09-28: qty 50

## 2021-09-28 MED ORDER — LABETALOL HCL 5 MG/ML IV SOLN: 10.0000 mg | INTRAVENOUS | Status: AC | PRN

## 2021-09-28 MED ORDER — HEPARIN SODIUM (PORCINE) 1000 UNIT/ML IJ SOLN
INTRAMUSCULAR | Status: DC | PRN
  Administered 2021-09-28: 11000 [IU] via INTRAVENOUS

## 2021-09-28 MED ORDER — SODIUM CHLORIDE 0.9% FLUSH
3.0000 mL | Freq: Two times a day (BID) | INTRAVENOUS | Status: DC
  Administered 2021-09-29: 3 mL via INTRAVENOUS

## 2021-09-28 MED ORDER — HEPARIN SODIUM (PORCINE) 1000 UNIT/ML IJ SOLN
INTRAMUSCULAR | Status: AC
  Filled 2021-09-28: qty 20

## 2021-09-28 MED ORDER — CLOPIDOGREL BISULFATE 300 MG PO TABS
ORAL_TABLET | ORAL | Status: AC
  Filled 2021-09-28: qty 2

## 2021-09-28 MED ORDER — SODIUM CHLORIDE 0.9 % IV SOLN: INTRAVENOUS | Status: AC

## 2021-09-28 MED ORDER — LIDOCAINE HCL (PF) 1 % IJ SOLN
INTRAMUSCULAR | Status: AC
  Filled 2021-09-28: qty 30

## 2021-09-28 MED ORDER — NITROGLYCERIN 1 MG/10 ML FOR IR/CATH LAB
INTRA_ARTERIAL | Status: AC
  Filled 2021-09-28: qty 10

## 2021-09-28 MED ORDER — SODIUM CHLORIDE 0.9% FLUSH: 3.0000 mL | INTRAVENOUS | Status: DC | PRN

## 2021-09-28 MED ORDER — CLOPIDOGREL BISULFATE 300 MG PO TABS
ORAL_TABLET | ORAL | Status: DC | PRN
  Administered 2021-09-28: 600 mg via ORAL

## 2021-09-28 MED ORDER — WARFARIN SODIUM 2 MG PO TABS
4.0000 mg | ORAL_TABLET | Freq: Once | ORAL | Status: AC
  Administered 2021-09-28: 4 mg via ORAL
  Filled 2021-09-28: qty 2

## 2021-09-28 MED ORDER — PANTOPRAZOLE SODIUM 40 MG PO TBEC
40.0000 mg | DELAYED_RELEASE_TABLET | Freq: Two times a day (BID) | ORAL | Status: DC
  Administered 2021-09-28 – 2021-09-29 (×3): 40 mg via ORAL
  Filled 2021-09-28 (×3): qty 1

## 2021-09-28 MED ORDER — WARFARIN SODIUM 2 MG PO TABS: 4.0000 mg | ORAL_TABLET | Freq: Once | ORAL | Status: DC

## 2021-09-28 MED ORDER — ASPIRIN 81 MG PO CHEW
81.0000 mg | CHEWABLE_TABLET | Freq: Every day | ORAL | Status: DC
  Administered 2021-09-29: 81 mg via ORAL
  Filled 2021-09-28 (×2): qty 1

## 2021-09-28 MED ORDER — LIDOCAINE HCL (PF) 1 % IJ SOLN
INTRAMUSCULAR | Status: DC | PRN
  Administered 2021-09-28: 15 mL

## 2021-09-28 SURGICAL SUPPLY — 17 items
BALLN SAPPHIRE 2.0X12 (BALLOONS) ×1
BALLOON SAPPHIRE 2.0X12 (BALLOONS) IMPLANT
CATH INFINITI 5FR JL5 (CATHETERS) IMPLANT
CATH INFINITI 5FR MULTPACK ANG (CATHETERS) IMPLANT
CATH VISTA GUIDE 6FR JR4 (CATHETERS) IMPLANT
KIT HEART LEFT (KITS) ×1 IMPLANT
PACK CARDIAC CATHETERIZATION (CUSTOM PROCEDURE TRAY) ×1 IMPLANT
SHEATH PINNACLE 5F 10CM (SHEATH) IMPLANT
SHEATH PINNACLE 6F 10CM (SHEATH) IMPLANT
SHEATH PROBE COVER 6X72 (BAG) IMPLANT
STENT SYNERGY XD 3.50X16 (Permanent Stent) IMPLANT
SYNERGY XD 3.50X16 (Permanent Stent) ×1 IMPLANT
SYR MEDRAD MARK 7 150ML (SYRINGE) ×1 IMPLANT
TRANSDUCER W/STOPCOCK (MISCELLANEOUS) ×1 IMPLANT
TUBING CIL FLEX 10 FLL-RA (TUBING) ×1 IMPLANT
WIRE ASAHI PROWATER 180CM (WIRE) IMPLANT
WIRE EMERALD 3MM-J .035X150CM (WIRE) IMPLANT

## 2021-09-28 NOTE — Progress Notes (Signed)
Site area: left groin Site Prior to Removal:  Level 0 Pressure Applied For: 25 minutes Manual:   yes Patient Status During Pull:  stable Post Pull Site:  Level 0 Post Pull Instructions Given:  yes Post Pull Pulses Present: left dp palpable Dressing Applied:  gauze and tegaderm Bedrest begins @ 1325 Comments:

## 2021-09-28 NOTE — Progress Notes (Signed)
Rounding Note    Patient Name: Gary [name] Date of Encounter: 09/28/2021  [place] Cardiologist: [name], MD   Subjective  No pain on heparin Able to get him on as first case today  Inpatient Medications    Scheduled Meds:  [MAR Hold] amLODipine  2.5 mg Oral Daily   [MAR Hold] carvedilol  25 mg Oral BID   [MAR Hold] hydrochlorothiazide  12.5 mg Oral Daily   [MAR Hold] levothyroxine  137 mcg Oral Once per day on Sun Mon Tue Thu Fri Sat   [facility] Hold] levothyroxine  205.5 mcg Oral Once per day on Wed   [facility] Hold] losartan  100 mg Oral Daily   [MAR Hold] regadenoson  0.4 mg Intravenous Once   sodium chloride flush  3 mL Intravenous Q12H   [MAR Hold] sulfamethoxazole-trimethoprim  1 tablet Oral Daily   [MAR Hold] venlafaxine XR  150 mg Oral Daily   [MAR Hold] Warfarin - Pharmacist Dosing Inpatient   Does not apply q1600   Continuous Infusions:  sodium chloride     sodium chloride     heparin Stopped (09/28/21 0722)   PRN Meds: sodium chloride, [MAR Hold] acetaminophen, [MAR Hold] nitroGLYCERIN, [MAR Hold] ondansetron (ZOFRAN) IV, sodium chloride flush   Vital Signs    Vitals:   09/27/21 0925 09/27/21 1832 09/27/21 2201 09/28/21 0512  BP: 118/61 135/74 120/68 119/72  Pulse:  65 60 61  Resp:  15  18  Temp:  98 F (36.7 C) 97.6 F (36.4 C) 97.6 F (36.4 C)  TempSrc:  Oral Oral Oral  SpO2:  98% 95% 97%  Weight:    110.4 kg  Height:        Intake/Output Summary (Last 24 hours) at 09/28/2021 0738 Last data filed at 09/27/2021 2208 Gross per 24 hour  Intake 243 ml  Output --  Net 243 ml      09/28/2021    5:12 AM 09/25/2021   10:20 PM 09/11/2021    9:09 AM  Last 3 Weights  Weight (lbs) 243 lb 4.8 oz 246 lb 11.2 oz 251 lb  Weight (kg) 110.36 kg 111.902 kg 113.853 kg      Telemetry    SR - Personally Reviewed  ECG    Sinus brady, HR 57 - Personally Reviewed  Physical Exam   GEN: No acute distress.   Neck: No JVD Cardiac: RRR,  no murmurs, rubs, or gallops.  Respiratory: Clear to auscultation bilaterally. GI: Soft, nontender, non-distended  MS: No edema; No deformity. Neuro:  Nonfocal  Psych: Normal affect   Labs    High Sensitivity Troponin:   Recent Labs  Lab 09/25/21 1030 09/25/21 1210 09/25/21 1632 09/25/21 1800  TROPONINIHS 80* 96* 165* 219*     Chemistry Recent Labs  Lab 09/25/21 1030 09/26/21 0344 09/28/21 0230  NA 139 139 140  K 3.4* 3.4* 3.5  CL 105 102 105  CO2 _0 GLUCOSE 160* 112* 151*  BUN 20 21 26*  CREATININE 0.93 0.88 1.18  CALCIUM 8.5* 9.4 9.7  PROT 5.8*  --   --   ALBUMIN 3.2*  --   --   AST 28  --   --   ALT 20  --   --   ALKPHOS 83  --   --   BILITOT 0.5  --   --   GFRNONAA >60 >60 >60  ANIONGAP _1 Lipids  Lab Results  Component  Value Date   CHOL 155 05/26/2020   HDL 45 05/26/2020   LDLCALC 79 05/26/2020   TRIG 184 (H) 05/26/2020   CHOLHDL 3.4 05/26/2020     Hematology Recent Labs  Lab 09/26/21 0344 09/27/21 0723 09/28/21 0230  WBC 10.2 8.8 8.8  RBC 4.53 4.68 4.58  HGB 12.3* 12.5* 12.3*  HCT 37.2* 39.2 38.2*  MCV 82.1 83.8 83.4  MCH 27.2 26.7 26.9  MCHC 33.1 31.9 32.2  RDW 15.4 15.3 15.5  PLT 314 336 321   Thyroid No results for input(s): "TSH", "FREET4" in the last 168 hours.  BNPNo results for input(s): "BNP", "PROBNP" in the last 168 hours.  DDimer No results for input(s): "DDIMER" in the last 168 hours.   Radiology    ECHOCARDIOGRAM COMPLETE  Result Date: 09/26/2021    ECHOCARDIOGRAM REPORT   Patient Name:   Gary [name] [facility] Date of Exam: 09/26/2021 Medical Rec #:  [id number]         Height:       70.0 in Accession #:    [id number]        Weight:       246.7 lb Date of Birth:  1945-08-23          BSA:          2.282 m Patient Age:    76 years          BP:           134/78 mmHg Patient Gender: M                 HR:           58 bpm. Exam Location:  Inpatient Procedure: 2D Echo Indications:    chest pain  History:        Patient  has prior history of Echocardiogram examinations, most                 recent 09/19/2020. CAD, Arrythmias:Atrial Fibrillation; Risk                 Factors:Hypertension, Dyslipidemia and Sleep Apnea.  Sonographer:    Johny Chess RDCS Referring Phys: [id number] [place]  1. Left ventricular ejection fraction, by estimation, is 50 to 55%. The left ventricle has low normal function. The left ventricle has no regional wall motion abnormalities. There is moderate left ventricular hypertrophy. Left ventricular diastolic parameters are consistent with Grade II diastolic dysfunction (pseudonormalization).  2. Right ventricular systolic function is normal. The right ventricular size is normal. There is normal pulmonary artery systolic pressure. The estimated right ventricular systolic pressure is 26.7 mmHg.  3. No evidence of mitral valve regurgitation. Moderate to severe mitral annular calcification.  4. There is mild calcification of the aortic valve. Aortic valve regurgitation is not visualized. No aortic stenosis is present.  5. The inferior vena cava is normal in size with greater than 50% respiratory variability, suggesting right atrial pressure of 3 mmHg. Comparison(s): No significant change from prior study. FINDINGS  Left Ventricle: Left ventricular ejection fraction, by estimation, is 50 to 55%. The left ventricle has low normal function. The left ventricle has no regional wall motion abnormalities. The left ventricular internal cavity size was normal in size. There is moderate left ventricular hypertrophy. Left ventricular diastolic parameters are consistent with Grade II diastolic dysfunction (pseudonormalization). Right Ventricle: The right ventricular size is normal. No increase in right ventricular wall thickness. Right ventricular systolic function is normal. There is normal pulmonary artery  systolic pressure. The tricuspid regurgitant velocity is 2.56 m/s, and  with an assumed right atrial  pressure of 3 mmHg, the estimated right ventricular systolic pressure is 16.1 mmHg. Left Atrium: Left atrial size was normal in size. Right Atrium: Right atrial size was normal in size. Pericardium: There is no evidence of pericardial effusion. Mitral Valve: Moderate to severe mitral annular calcification. No evidence of mitral valve regurgitation. Tricuspid Valve: The tricuspid valve is normal in structure. Tricuspid valve regurgitation is not demonstrated. Aortic Valve: There is mild calcification of the aortic valve. Aortic valve regurgitation is not visualized. No aortic stenosis is present. Pulmonic Valve: The pulmonic valve was normal in structure. Pulmonic valve regurgitation is not visualized. Aorta: The aortic root and ascending aorta are structurally normal, with no evidence of dilitation. Venous: The inferior vena cava is normal in size with greater than 50% respiratory variability, suggesting right atrial pressure of 3 mmHg. IAS/Shunts: No atrial level shunt detected by color flow Doppler.  LEFT VENTRICLE PLAX 2D LVIDd:         5.60 cm   Diastology LVIDs:         3.40 cm   LV e' medial:    4.13 cm/s LV PW:         1.60 cm   LV E/e' medial:  24.0 LV IVS:        1.20 cm   LV e' lateral:   7.94 cm/s LVOT diam:     2.20 cm   LV E/e' lateral: 12.5 LV SV:         86 LV SV Index:   38        2D Longitudinal Strain LVOT Area:     3.80 cm  2D Strain GLS Avg:     -9.8 %  RIGHT VENTRICLE             IVC RV S prime:     11.20 cm/s  IVC diam: 2.00 cm TAPSE (M-mode): 1.4 cm LEFT ATRIUM             Index        RIGHT ATRIUM           Index LA diam:        5.50 cm 2.41 cm/m   RA Area:     16.20 cm LA Vol (A2C):   61.1 ml 26.77 ml/m  RA Volume:   39.60 ml  17.35 ml/m LA Vol (A4C):   73.6 ml 32.25 ml/m LA Biplane Vol: 71.3 ml 31.24 ml/m  AORTIC VALVE LVOT Vmax:   105.00 cm/s LVOT Vmean:  66.900 cm/s LVOT VTI:    0.227 m  AORTA Ao Root diam: 3.00 cm Ao Asc diam:  3.60 cm MITRAL VALVE               TRICUSPID VALVE MV  Area (PHT): 3.00 cm    TR Peak grad:   26.2 mmHg MV Decel Time: 253 msec    TR Vmax:        256.00 cm/s MV E velocity: 99.20 cm/s MV A velocity: 40.20 cm/s  SHUNTS MV E/A ratio:  2.47        Systemic VTI:  0.23 m                            Systemic Diam: 2.20 cm Phineas Inches Electronically signed by Phineas Inches Signature Date/Time: 09/26/2021/1:23:02 PM    Final  Cardiac Studies   ECHO: EF 50-55% MAC / AV sclerosis   Patient Profile     76 y.o. male with a hx of hypertension, melanoma, CAD s/p PCI (stent to distal RCA 2003, stents to RCA and LAD in 2004, stent to proximal mid and distal LAD 2005) and CABG (2006), mild ascending aortic aneurysm, chronic systolic and diastolic heart failure, hyperlipidemia, paroxysmal atrial fibrillation, and OSA (not compliant with CPAP due to sinus issues) who was admitted 09/25/2021 for atypical chest pain and elevated troponin.  Assessment & Plan    NSTEMI - Positive troponin  - he had a minor re-occurrence of pain, and with trop > 200,  cath  this am  - heparin -  EF 50-55% on TTE  - Cardiac catheterization was discussed with the patient fully. The patient understands that risks include but are not limited to stroke (1 in 1000), death (1 in 55), kidney failure [usually temporary] (1 in 500), bleeding (1 in 200), allergic reaction [possibly serious] (1 in 200).  The patient understands and is willing to proceed.     For questions or updates, please contact [place] Please consult [email or web address] for contact info under      Signed, Jenkins Rouge, MD  09/28/2021, 7:38 AM  \

## 2021-09-28 NOTE — Interval H&P Note (Signed)
History and Physical Interval Note:  9/25/2023Cath Lab Visit (complete for each Cath Lab visit)  Clinical Evaluation Leading to the Procedure:   ACS: Yes.    Non-ACS:    Anginal Classification: CCS II  Anti-ischemic medical therapy: Minimal Therapy (1 class of medications)  Non-Invasive Test Results: No non-invasive testing performed  Prior CABG: Previous CABG       7:41 AM  Gary [name]  has presented today for surgery, with the diagnosis of NSTEMI.  The various methods of treatment have been discussed with the patient and family. After consideration of risks, benefits and other options for treatment, the patient has consented to  Procedure(s): LEFT HEART CATH AND CORS/GRAFTS ANGIOGRAPHY (N/A) as a surgical intervention.  The patient's history has been reviewed, patient examined, no change in status, stable for surgery.  I have reviewed the patient's chart and labs.  Questions were answered to the patient's satisfaction.     Gary [name]

## 2021-09-28 NOTE — Progress Notes (Signed)
ANTICOAGULATION CONSULT NOTE - Follow Up  Pharmacy Consult for heparin>warfarin  Indication: chest pain/ACS  Allergies  Allergen Reactions   Statins Other (See Comments)    Muscle pain    Patient Measurements: Height: '5\' 10"'$  (177.8 cm) Weight: 110.4 kg (243 lb 4.8 oz) IBW/kg (Calculated) : 73 Heparin Dosing Weight: 97.4kg  Vital Signs: Temp: 97.6 F (36.4 C) (09/25 0512) Temp Source: Oral (09/25 0512) BP: 145/77 (09/25 1400) Pulse Rate: 59 (09/25 1330)  Labs: Recent Labs    09/25/21 1632 09/25/21 1800 09/25/21 1800 09/26/21 0344 09/26/21 1222 09/26/21 2248 09/27/21 0723 09/28/21 0230  HGB  --   --    < > 12.3*  --   --  12.5* 12.3*  HCT  --   --   --  37.2*  --   --  39.2 38.2*  PLT  --   --   --  314  --   --  336 321  LABPROT  --  21.7*   < >  --  18.4*  --  17.5* 16.3*  INR  --  1.9*   < >  --  1.6*  --  1.5* 1.3*  HEPARINUNFRC  --   --   --   --   --  0.22* 0.30 0.41  CREATININE  --   --   --  0.88  --   --   --  1.18  TROPONINIHS 165* 219*  --   --   --   --   --   --    < > = values in this interval not displayed.     Estimated Creatinine Clearance: 64.2 mL/min (by C-G formula based on SCr of 1.18 mg/dL).   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   09/25/2021   carvedilol (COREG) 25 MG tablet TAKE 1 TABLET BY MOUTH TWICE A DAY 180 tablet 3 09/25/2021 at 0730   cetirizine (ZYRTEC) 10 MG tablet Take 10 mg by mouth daily.   09/25/2021   Cholecalciferol (VITAMIN D3) 125 MCG (5000 UT) TABS Take 5,000 Units by mouth daily.   09/25/2021   Coenzyme Q10 (CO Q 10) 100 MG CAPS Take 300 mg by mouth daily.    09/25/2021   famotidine (PEPCID) 20 MG tablet Take 20 mg by mouth 2 (two) times daily.   09/25/2021   fluticasone (FLONASE ALLERGY RELIEF) 50 MCG/ACT nasal spray 1 spray in each nostril Nasally Once a day for 30 day(s)   unk   hydrochlorothiazide (HYDRODIURIL) 12.5 MG tablet TAKE 1 TABLET BY MOUTH EVERY DAY 90 tablet 1 09/25/2021   levothyroxine (SYNTHROID, LEVOTHROID) 137 MCG  tablet Take 137-205.5 mcg by mouth See admin instructions. Take 205.5 mcg by mouth on Wednesday.  Take 137 mcg by mouth daily on all other days   09/25/2021   losartan (COZAAR) 100 MG tablet TAKE 1 TABLET BY MOUTH EVERY DAY 90 tablet 3 09/25/2021   metFORMIN (GLUCOPHAGE) 500 MG tablet Take 500 mg by mouth 2 (two) times daily.  12 09/25/2021   Multiple Vitamin (MULTIVITAMIN WITH MINERALS) TABS tablet Take 1 tablet by mouth daily. Centrum silver   09/24/2021   pantoprazole (PROTONIX) 40 MG tablet Take 40 mg by mouth 2 (two) times daily.   09/25/2021   predniSONE (DELTASONE) 5 MG tablet Take 5 mg by mouth as needed (for wagoners).   0/93/8182   REPATHA SURECLICK 993 MG/ML SOAJ INJECT 140 MG INTO THE SKIN EVERY 14 (FOURTEEN) DAYS. INJECT 1 PEN INTO THE FATTY TISSUE SKIN EVERY 14 DAY 6 mL 3 Past Month   riTUXimab (RITUXAN IV) Inject 1 application into the vein every 6 (six) months.    Past Month   sodium chloride (OCEAN) 0.65 % SOLN nasal spray Place 1 spray into both nostrils daily as needed for congestion.    unk   sulfamethoxazole-trimethoprim (BACTRIM) 400-80 MG tablet Take 1 tablet by mouth daily.   09/25/2021   testosterone  cypionate (DEPOTESTOSTERONE CYPIONATE) 200 MG/ML injection Inject 100 mg into the muscle every 14 (fourteen) days.   Past Week   venlafaxine XR (EFFEXOR-XR) 150 MG 24 hr capsule Take 150 mg by mouth daily.    09/25/2021   warfarin (COUMADIN) 5 MG tablet TAKE 1/2 TO 1 TABLET BY MOUTH EVERY DAY OR AS DIRECTED BY COUMADIN CLINIC 90 tablet 0 09/25/2021 at 0730   cephALEXin (KEFLEX) 500 MG capsule Take by mouth every 8 (eight) hours. (Patient not taking: Reported on 09/25/2021)   Completed Course   doxycycline (VIBRAMYCIN) 100 MG capsule Take 100 mg by mouth 2 (two) times daily. (Patient not taking: Reported on 09/25/2021)   Completed Course   Scheduled:   amLODipine  2.5 mg Oral Daily   [START ON 09/29/2021] aspirin  81 mg Oral Daily   carvedilol  25 mg Oral BID   [START ON 09/29/2021] clopidogrel  75 mg Oral Q breakfast   hydrochlorothiazide  12.5 mg Oral Daily   levothyroxine  137 mcg Oral Once per day on Sun Mon Tue Thu Fri Sat   [START ON 09/30/2021] levothyroxine  205.5 mcg Oral Once per day on Wed   losartan  100 mg Oral Daily   pantoprazole  40 mg Oral BID   regadenoson  0.4 mg Intravenous Once   sodium chloride flush  3 mL Intravenous Q12H   sulfamethoxazole-trimethoprim  1 tablet Oral Daily   venlafaxine XR  150 mg Oral Daily   Warfarin - Pharmacist Dosing Inpatient   Does not apply q1600   Assessment: 28 yom with a history of HTN, melanoma, CAD s/p PCI, mild ascending aortic aneurysm, HF, HLD, AF on coumadin, OSA. Patient is presenting with chest pain. Pharmacy consulted to dose heparin for NSTEMI - heparin stopped post-cath, will resume warfarin per cardiology.   INR is subtherapeutic at 1.3. CBC stable this morning. No s/sx of bleeding. Of note, on bactrim PTA daily (was on PTA since 2019).  PTA regimen is 5 mg daily except 2.5 mg MF. Last dose on 09/25/2021.  Goal of Therapy:  INR 2-3 Monitor platelets by anticoagulation protocol: Yes  Plan:  Will order warfarin 4 mg tonight  (boosted dose from  PTA since has been held) Monitor daily INR, CBC, and for s/sx of bleeding   Antonietta Jewel, PharmD, [place] Pharmacist  Phone: [phone] 09/28/2021 3:32 PM  Please check AMION for all [place] phone numbers After 10:00 PM, call [place] [phone]

## 2021-09-28 NOTE — H&P (View-Only) (Signed)
Rounding Note    Patient Name: Gary [name] Date of Encounter: 09/28/2021  [place] Cardiologist: [name], MD   Subjective  No pain on heparin Able to get him on as first case today  Inpatient Medications    Scheduled Meds:  [MAR Hold] amLODipine  2.5 mg Oral Daily   [MAR Hold] carvedilol  25 mg Oral BID   [MAR Hold] hydrochlorothiazide  12.5 mg Oral Daily   [MAR Hold] levothyroxine  137 mcg Oral Once per day on Sun Mon Tue Thu Fri Sat   [facility] Hold] levothyroxine  205.5 mcg Oral Once per day on Wed   [facility] Hold] losartan  100 mg Oral Daily   [MAR Hold] regadenoson  0.4 mg Intravenous Once   sodium chloride flush  3 mL Intravenous Q12H   [MAR Hold] sulfamethoxazole-trimethoprim  1 tablet Oral Daily   [MAR Hold] venlafaxine XR  150 mg Oral Daily   [MAR Hold] Warfarin - Pharmacist Dosing Inpatient   Does not apply q1600   Continuous Infusions:  sodium chloride     sodium chloride     heparin Stopped (09/28/21 0722)   PRN Meds: sodium chloride, [MAR Hold] acetaminophen, [MAR Hold] nitroGLYCERIN, [MAR Hold] ondansetron (ZOFRAN) IV, sodium chloride flush   Vital Signs    Vitals:   09/27/21 0925 09/27/21 1832 09/27/21 2201 09/28/21 0512  BP: 118/61 135/74 120/68 119/72  Pulse:  65 60 61  Resp:  15  18  Temp:  98 F (36.7 C) 97.6 F (36.4 C) 97.6 F (36.4 C)  TempSrc:  Oral Oral Oral  SpO2:  98% 95% 97%  Weight:    110.4 kg  Height:        Intake/Output Summary (Last 24 hours) at 09/28/2021 0738 Last data filed at 09/27/2021 2208 Gross per 24 hour  Intake 243 ml  Output --  Net 243 ml      09/28/2021    5:12 AM 09/25/2021   10:20 PM 09/11/2021    9:09 AM  Last 3 Weights  Weight (lbs) 243 lb 4.8 oz 246 lb 11.2 oz 251 lb  Weight (kg) 110.36 kg 111.902 kg 113.853 kg      Telemetry    SR - Personally Reviewed  ECG    Sinus brady, HR 57 - Personally Reviewed  Physical Exam   GEN: No acute distress.   Neck: No JVD Cardiac: RRR,  no murmurs, rubs, or gallops.  Respiratory: Clear to auscultation bilaterally. GI: Soft, nontender, non-distended  MS: No edema; No deformity. Neuro:  Nonfocal  Psych: Normal affect   Labs    High Sensitivity Troponin:   Recent Labs  Lab 09/25/21 1030 09/25/21 1210 09/25/21 1632 09/25/21 1800  TROPONINIHS 80* 96* 165* 219*     Chemistry Recent Labs  Lab 09/25/21 1030 09/26/21 0344 09/28/21 0230  NA 139 139 140  K 3.4* 3.4* 3.5  CL 105 102 105  CO2 _0 GLUCOSE 160* 112* 151*  BUN 20 21 26*  CREATININE 0.93 0.88 1.18  CALCIUM 8.5* 9.4 9.7  PROT 5.8*  --   --   ALBUMIN 3.2*  --   --   AST 28  --   --   ALT 20  --   --   ALKPHOS 83  --   --   BILITOT 0.5  --   --   GFRNONAA >60 >60 >60  ANIONGAP _1 Lipids  Lab Results  Component  Value Date   CHOL 155 05/26/2020   HDL 45 05/26/2020   LDLCALC 79 05/26/2020   TRIG 184 (H) 05/26/2020   CHOLHDL 3.4 05/26/2020     Hematology Recent Labs  Lab 09/26/21 0344 09/27/21 0723 09/28/21 0230  WBC 10.2 8.8 8.8  RBC 4.53 4.68 4.58  HGB 12.3* 12.5* 12.3*  HCT 37.2* 39.2 38.2*  MCV 82.1 83.8 83.4  MCH 27.2 26.7 26.9  MCHC 33.1 31.9 32.2  RDW 15.4 15.3 15.5  PLT 314 336 321   Thyroid No results for input(s): "TSH", "FREET4" in the last 168 hours.  BNPNo results for input(s): "BNP", "PROBNP" in the last 168 hours.  DDimer No results for input(s): "DDIMER" in the last 168 hours.   Radiology    ECHOCARDIOGRAM COMPLETE  Result Date: 09/26/2021    ECHOCARDIOGRAM REPORT   Patient Name:   Gary [name] Date of Exam: 09/26/2021 Medical Rec #:  [id number]         Height:       70.0 in Accession #:    [id number]        Weight:       246.7 lb Date of Birth:  11/06/1945          BSA:          2.282 m Patient Age:    76 years          BP:           134/78 mmHg Patient Gender: M                 HR:           58 bpm. Exam Location:  Inpatient Procedure: 2D Echo Indications:    chest pain  History:        Patient  has prior history of Echocardiogram examinations, most                 recent 09/19/2020. CAD, Arrythmias:Atrial Fibrillation; Risk                 Factors:Hypertension, Dyslipidemia and Sleep Apnea.  Sonographer:    Lauren Pennington RDCS Referring Phys: [id number] EVAN WILLIAMS IMPRESSIONS  1. Left ventricular ejection fraction, by estimation, is 50 to 55%. The left ventricle has low normal function. The left ventricle has no regional wall motion abnormalities. There is moderate left ventricular hypertrophy. Left ventricular diastolic parameters are consistent with Grade II diastolic dysfunction (pseudonormalization).  2. Right ventricular systolic function is normal. The right ventricular size is normal. There is normal pulmonary artery systolic pressure. The estimated right ventricular systolic pressure is 29.2 mmHg.  3. No evidence of mitral valve regurgitation. Moderate to severe mitral annular calcification.  4. There is mild calcification of the aortic valve. Aortic valve regurgitation is not visualized. No aortic stenosis is present.  5. The inferior vena cava is normal in size with greater than 50% respiratory variability, suggesting right atrial pressure of 3 mmHg. Comparison(s): No significant change from prior study. FINDINGS  Left Ventricle: Left ventricular ejection fraction, by estimation, is 50 to 55%. The left ventricle has low normal function. The left ventricle has no regional wall motion abnormalities. The left ventricular internal cavity size was normal in size. There is moderate left ventricular hypertrophy. Left ventricular diastolic parameters are consistent with Grade II diastolic dysfunction (pseudonormalization). Right Ventricle: The right ventricular size is normal. No increase in right ventricular wall thickness. Right ventricular systolic function is normal. There is normal pulmonary artery   systolic pressure. The tricuspid regurgitant velocity is 2.56 m/s, and  with an assumed right atrial  pressure of 3 mmHg, the estimated right ventricular systolic pressure is 64.4 mmHg. Left Atrium: Left atrial size was normal in size. Right Atrium: Right atrial size was normal in size. Pericardium: There is no evidence of pericardial effusion. Mitral Valve: Moderate to severe mitral annular calcification. No evidence of mitral valve regurgitation. Tricuspid Valve: The tricuspid valve is normal in structure. Tricuspid valve regurgitation is not demonstrated. Aortic Valve: There is mild calcification of the aortic valve. Aortic valve regurgitation is not visualized. No aortic stenosis is present. Pulmonic Valve: The pulmonic valve was normal in structure. Pulmonic valve regurgitation is not visualized. Aorta: The aortic root and ascending aorta are structurally normal, with no evidence of dilitation. Venous: The inferior vena cava is normal in size with greater than 50% respiratory variability, suggesting right atrial pressure of 3 mmHg. IAS/Shunts: No atrial level shunt detected by color flow Doppler.  LEFT VENTRICLE PLAX 2D LVIDd:         5.60 cm   Diastology LVIDs:         3.40 cm   LV e' medial:    4.13 cm/s LV PW:         1.60 cm   LV E/e' medial:  24.0 LV IVS:        1.20 cm   LV e' lateral:   7.94 cm/s LVOT diam:     2.20 cm   LV E/e' lateral: 12.5 LV SV:         86 LV SV Index:   38        2D Longitudinal Strain LVOT Area:     3.80 cm  2D Strain GLS Avg:     -9.8 %  RIGHT VENTRICLE             IVC RV S prime:     11.20 cm/s  IVC diam: 2.00 cm TAPSE (M-mode): 1.4 cm LEFT ATRIUM             Index        RIGHT ATRIUM           Index LA diam:        5.50 cm 2.41 cm/m   RA Area:     16.20 cm LA Vol (A2C):   61.1 ml 26.77 ml/m  RA Volume:   39.60 ml  17.35 ml/m LA Vol (A4C):   73.6 ml 32.25 ml/m LA Biplane Vol: 71.3 ml 31.24 ml/m  AORTIC VALVE LVOT Vmax:   105.00 cm/s LVOT Vmean:  66.900 cm/s LVOT VTI:    0.227 m  AORTA Ao Root diam: 3.00 cm Ao Asc diam:  3.60 cm MITRAL VALVE               TRICUSPID VALVE MV  Area (PHT): 3.00 cm    TR Peak grad:   26.2 mmHg MV Decel Time: 253 msec    TR Vmax:        256.00 cm/s MV E velocity: 99.20 cm/s MV A velocity: 40.20 cm/s  SHUNTS MV E/A ratio:  2.47        Systemic VTI:  0.23 m                            Systemic Diam: 2.20 cm Phineas Inches Electronically signed by Phineas Inches Signature Date/Time: 09/26/2021/1:23:02 PM    Final  Cardiac Studies   ECHO: EF 50-55% MAC / AV sclerosis   Patient Profile     76 y.o. male with a hx of hypertension, melanoma, CAD s/p PCI (stent to distal RCA 2003, stents to RCA and LAD in 2004, stent to proximal mid and distal LAD 2005) and CABG (2006), mild ascending aortic aneurysm, chronic systolic and diastolic heart failure, hyperlipidemia, paroxysmal atrial fibrillation, and OSA (not compliant with CPAP due to sinus issues) who was admitted 09/25/2021 for atypical chest pain and elevated troponin.  Assessment & Plan    NSTEMI - Positive troponin  - he had a minor re-occurrence of pain, and with trop > 200,  cath  this am  - heparin -  EF 50-55% on TTE  - Cardiac catheterization was discussed with the patient fully. The patient understands that risks include but are not limited to stroke (1 in 1000), death (1 in 44), kidney failure [usually temporary] (1 in 500), bleeding (1 in 200), allergic reaction [possibly serious] (1 in 200).  The patient understands and is willing to proceed.     For questions or updates, please contact [facility] Please consult [email or web address] for contact info under      Signed, Jenkins Rouge, MD  09/28/2021, 7:38 AM  \

## 2021-09-28 NOTE — Care Management (Signed)
  Transition of Care [facility]) Screening Note   Patient Details  Name: Gary [name] Date of Birth: February 26, 1945   Transition of Care [facility]) CM/SW Contact:    Bethena Roys, RN Phone Number: 09/28/2021, 3:42 PM    Transition of Care Department [facility]) has reviewed the patient and no TOC needs have been identified at this time. We will continue to monitor patient advancement through interdisciplinary progression rounds. If new patient transition needs arise, please place a TOC consult.

## 2021-09-29 ENCOUNTER — Other Ambulatory Visit (HOSPITAL_COMMUNITY): Payer: Self-pay

## 2021-09-29 DIAGNOSIS — I200 Unstable angina: Secondary | ICD-10-CM | POA: Diagnosis not present

## 2021-09-29 DIAGNOSIS — I5042 Chronic combined systolic (congestive) and diastolic (congestive) heart failure: Secondary | ICD-10-CM | POA: Diagnosis not present

## 2021-09-29 DIAGNOSIS — R079 Chest pain, unspecified: Secondary | ICD-10-CM | POA: Diagnosis not present

## 2021-09-29 DIAGNOSIS — M3130 Wegener's granulomatosis without renal involvement: Secondary | ICD-10-CM | POA: Diagnosis not present

## 2021-09-29 DIAGNOSIS — I214 Non-ST elevation (NSTEMI) myocardial infarction: Secondary | ICD-10-CM | POA: Diagnosis not present

## 2021-09-29 DIAGNOSIS — E1169 Type 2 diabetes mellitus with other specified complication: Secondary | ICD-10-CM | POA: Diagnosis not present

## 2021-09-29 LAB — CBC
HCT: 35 % — ABNORMAL LOW (ref 39.0–52.0)
Hemoglobin: 11.2 g/dL — ABNORMAL LOW (ref 13.0–17.0)
MCH: 26.7 pg (ref 26.0–34.0)
MCHC: 32 g/dL (ref 30.0–36.0)
MCV: 83.3 fL (ref 80.0–100.0)
Platelets: 297 10*3/uL (ref 150–400)
RBC: 4.2 MIL/uL — ABNORMAL LOW (ref 4.22–5.81)
RDW: 15.4 % (ref 11.5–15.5)
WBC: 7.6 10*3/uL (ref 4.0–10.5)
nRBC: 0 % (ref 0.0–0.2)

## 2021-09-29 LAB — PROTIME-INR
INR: 1.1 (ref 0.8–1.2)
Prothrombin Time: 14.4 seconds (ref 11.4–15.2)

## 2021-09-29 LAB — BASIC METABOLIC PANEL
Anion gap: 7 (ref 5–15)
BUN: 18 mg/dL (ref 8–23)
CO2: 25 mmol/L (ref 22–32)
Calcium: 8.7 mg/dL — ABNORMAL LOW (ref 8.9–10.3)
Chloride: 107 mmol/L (ref 98–111)
Creatinine, Ser: 1.16 mg/dL (ref 0.61–1.24)
GFR, Estimated: >60 mL/min (ref >60)
Glucose, Bld: 143 mg/dL — ABNORMAL HIGH (ref 70–99)
Potassium: 3.4 mmol/L — ABNORMAL LOW (ref 3.5–5.1)
Sodium: 139 mmol/L (ref 135–145)

## 2021-09-29 LAB — POCT ACTIVATED CLOTTING TIME
Activated Clotting Time: 299 seconds
Activated Clotting Time: 275 seconds

## 2021-09-29 MED ORDER — WARFARIN SODIUM 5 MG PO TABS
10.0000 mg | ORAL_TABLET | Freq: Once | ORAL | Status: AC
  Administered 2021-09-29: 10 mg via ORAL
  Filled 2021-09-29: qty 2

## 2021-09-29 MED ORDER — NITROGLYCERIN 0.4 MG SL SUBL
0.4000 mg | SUBLINGUAL_TABLET | SUBLINGUAL | 12 refills | Status: DC | PRN
  Filled 2021-09-29: qty 25, 25d supply, fill #0

## 2021-09-29 MED ORDER — WARFARIN SODIUM 5 MG PO TABS: 5.0000 mg | ORAL_TABLET | Freq: Once | ORAL | Status: DC

## 2021-09-29 MED ORDER — ASPIRIN 81 MG PO CHEW
81.0000 mg | CHEWABLE_TABLET | Freq: Every day | ORAL | 0 refills | Status: AC
  Filled 2021-09-29: qty 30, 30d supply, fill #0

## 2021-09-29 MED ORDER — CLOPIDOGREL BISULFATE 75 MG PO TABS
75.0000 mg | ORAL_TABLET | Freq: Every day | ORAL | 3 refills | Status: DC
  Filled 2021-09-29: qty 90, 90d supply, fill #0

## 2021-09-29 MED FILL — Nitroglycerin IV Soln 100 MCG/ML in D5W: INTRA_ARTERIAL | Qty: 10 | Status: AC

## 2021-09-29 NOTE — Discharge Summary (Addendum)
Discharge Summary    Patient ID: Gary [name] MRN: [id number]; DOB: [birth year]  Admit date: 09/25/2021 Discharge date: 09/29/2021  PCP:  [name], [facility] Cardiologist:  [name], MD   {   Discharge Diagnoses    Principal Problem:   Chest pain Active Problems:   Essential hypertension   Hyperlipidemia   Atrial fibrillation ([place])   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[place])   Long term (current) use of anticoagulants   Type 2 diabetes mellitus with other specified complication [facility])   Chest pain due to coronary artery disease ([place])   Non-ST elevation (NSTEMI) myocardial infarction [facility])    Diagnostic Studies/Procedures    Echocardiogram 09/26/21  1. Left ventricular ejection fraction, by estimation, is 50 to 55%. The  left ventricle has low normal function. The left ventricle has no regional  wall motion abnormalities. There is moderate left ventricular hypertrophy.  Left ventricular diastolic  parameters are consistent with Grade II diastolic dysfunction  (pseudonormalization).   2. Right ventricular systolic function is normal. The right ventricular  size is normal. There is normal pulmonary artery systolic pressure. The  estimated right ventricular systolic pressure is 93.7 mmHg.   3. No evidence of mitral valve regurgitation. Moderate to severe mitral  annular calcification.   4. There is mild calcification of the aortic valve. Aortic valve  regurgitation is not visualized. No aortic stenosis is present.   5. The inferior vena cava is normal in size with greater than 50%  respiratory variability, suggesting right atrial pressure of 3 mmHg.   Comparison(s): No significant change from prior study.   Left Heart Catheterization 09/28/21   Prox LAD to Mid LAD lesion is 100% stenosed.   Ost LAD to Prox LAD lesion is 100% stenosed.   Ramus lesion is 100% stenosed.   Prox RCA lesion is 100% stenosed.   Prox RCA to Dist RCA  lesion is 100% stenosed.   Ost Cx to Prox Cx lesion is 30% stenosed.   Dist Graft to Insertion lesion is 99% stenosed.   A drug-eluting stent was successfully placed using a SYNERGY XD 3.50X16.   Post intervention, there is a 0% residual stenosis. IMPRESSION: Successful PCI drug-eluting stenting of a high-grade distal PDA SVG stenosis.  He did have grade 2 left to right collaterals and normal LV function.  The sheath will be removed removed once ACT falls below and 70 pressure held.  He will be hydrated overnight.  He will need "triple therapy" with aspirin, clopidogrel and Coumadin for 30 days after which aspirin can be discontinued.  Clopidogrel can be continued for 12 months.  After that clopidogrel can be discontinued and aspirin restarted.  He left the lab in stable condition.  Diagnostic Dominance: Right  Intervention     _____________   History of Present Illness     Gary [name] is a 76 y.o. male with with a hx of hypertension, melanoma, CAD s/p PCI (stent to distal RCA 2003, stents to RCA and LAD in 2004, stent to proximal mid and distal LAD 2005) and CABG (2006), mild ascending aortic aneurysm, chronic systolic and diastolic heart failure, hyperlipidemia, paroxysmal atrial fibrillation, and OSA (not compliant with CPAP due to sinus issues) who was seen 09/25/2021 for the evaluation of atypical chest pain and elevated troponin.  Patient presented to the emergency department on 9/22 after he had an episode of unusual chest pain at home that morning.  Patient reported that approximately 30 minutes  after eating breakfast, he experienced left chest/axillary pain that radiated into his throat and jaw.  Patient described the pain as sharp in nature, rated 8 out of 10.  Patient recalled that with his previous CAD symptoms, the pain did radiate to his neck.  Due to the similarity in symptoms, patient called the cardiology office and he was ultimately advised to contact 911.  Patient  reported that by the time EMS arrived and loaded him into the ambulance, he was chest pain-free.    Patient also reported that he had been having similar left axillary/chest pain every single night since 9/7.  The only difference with the pain on the morning of 9/22 was its increased severity and its radiation into his neck.  Patient's nocturnal pain had been occurring when the patient laid in bed on his right side. Pain always resolved when he sat up.  Patient reported that he had talked about this pain at his regularly scheduled cardiology follow-up on 9/8.  EKG from that day was without acute ischemic changes.  Patient attributed his symptoms to acid reflux, he was seen by an app at his primary care office on 9/21 given an aggressive GI regimen.    Hospital Course     Consultants: None   NSTEMI CAD s/p PCI and CABG - Patient presented on 09/2020 complaining of sharp pain in his left chest, radiated to her throat/neck.  Patient also complained of having similar left-sided chest pain every night since 9/7 - As chest pain was somewhat atypical on presentation, originally planned for a nuclear stress test.  However, patient had recurrence of chest pain and hsTn trended upwards (80>>96>>165>>219). Decided to cancel stress test and proceed with LHC  - Patient underwent left heart catheterization on 9/25 with successful PCI with DES to distal limb of SVG to PDA  -Per Dr. Kennon Holter recommendations, patient will need triple therapy with aspirin, clopidogrel, Coumadin for 30 days.  After 30 days, aspirin can be discontinued.  Clopidogrel should be continued for 12 months.  After 12 months, clopidogrel can be discontinued and aspirin resumed - Continue carvedilol 25 mg BID - Continue Repatha. Patient has a documented history of statin intolerance - Patient was given a prescription of PRN SL nitro at discharge. Confirmed that patient was not taking Cialis/Viagra or similar medications and, and I informed him  that if he were to start Cialis/Viagra or other medications for ED, he would need to inform the prescribed that he takes SL nitro.   Chronic diastolic CHF Chronic systolic Heart Failure with Improved EF  - Echocardiogram this admission with EF 50-55%, moderate LVH, grade 2 diastolic dysfunction, normal RV systolic function -Continue carvedilol 25 mg twice daily, losartan 100 mg daily   Hypertension - Continue amlodipine 2.5 mg once daily, Coreg 25 mg twice daily, hydrochlorothiazide 12.5 mg once daily, losartan 100 mg once daily.   Paroxysmal atrial fibrillation Chronic anticoagulation with coumadin  - Coumadin was held during this admission for cath. Discussed with pharmacy-- patient received boosted doses yesterday and today.  - Resume home dosing of coumadin tomorrow. Patient has an appointment with the coumadin clinic on 10/2  - Continue Coreg 25 mg twice daily    Hyperlipidemia - Patient on PCSK9, Repatha.   History of Ascending aortic aneurysm - Ascending aortic aneurysm of 4.0 cm noted in 2016 on ultrasound.   - Repeat echo in September 2022 revealed normal aorta size and structure. - Echo this admission without evidence of dilation, aortic root and  ascending aorta are structurally normal   Granulomatosis with polyangiitis  - Patient continues to be followed closely by rheumatology.  Receives a rituximab injection every 6 months.   Obstructive sleep apnea - Patient confirms that he is not using a CPAP.  - Per outpatient office visit note from 9/8, patient agreeable to repeat sleep study which was ordered.   Concern for GERD - Continue daily Protonix and Pepcid as started by PCP.   Hypothyroidism - Continue daily levothyroxine 137 mcg.   Diabetes, type II - Patient on metformin 571m BID - Resume metformin 48 hours after cath    Did the patient have an acute coronary syndrome (MI, NSTEMI, STEMI, etc) this admission?:  No                               Did the patient  have a percutaneous coronary intervention (stent / angioplasty)?:  Yes.     Cath/PCI Registry Performance & Quality Measures: Aspirin prescribed? - Yes ADP Receptor Inhibitor (Plavix/Clopidogrel, Brilinta/Ticagrelor or Effient/Prasugrel) prescribed (includes medically managed patients)? - Yes High Intensity Statin (Lipitor 40-828mor Crestor 20-4036mprescribed? - No - Statin intolerant, on repatha For EF <40%, was ACEI/ARB prescribed? - Yes For EF <40%, Aldosterone Antagonist (Spironolactone or Eplerenone) prescribed? - Not Applicable (EF >/= 40%88%ardiac Rehab Phase II ordered? - Yes   Patient seen and examined by Dr. NisJohnsie Canceld deemed stable for discharge   Patient has a follow up appointment on 10/11. He has an appointment with the coumadin clinic on 10/2.     _____________  Discharge Vitals Blood pressure 129/60, pulse (!) 58, temperature 97.9 F (36.6 C), temperature source Oral, resp. rate 20, height _0  (1.778 m), weight 110.4 kg, SpO2 96 %.  Filed Weights   09/25/21 2220 09/28/21 0512  Weight: 111.9 kg 110.4 kg    Labs & Radiologic Studies    CBC Recent Labs    09/28/21 0230 09/29/21 0230  WBC 8.8 7.6  HGB 12.3* 11.2*  HCT 38.2* 35.0*  MCV 83.4 83.3  PLT 321 [id number] Metabolic Panel Recent Labs    09/28/21 0230 09/29/21 0230  NA 140 139  K 3.5 3.4*  CL 105 107  CO2 24 25  GLUCOSE 151* 143*  BUN 26* 18  CREATININE 1.18 1.16  CALCIUM 9.7 8.7*   Liver Function Tests No results for input(s): "AST", "ALT", "ALKPHOS", "BILITOT", "PROT", "ALBUMIN" in the last 72 hours. No results for input(s): "LIPASE", "AMYLASE" in the last 72 hours. High Sensitivity Troponin:   Recent Labs  Lab 09/25/21 1030 09/25/21 1210 09/25/21 1632 09/25/21 1800  TROPONINIHS 80* 96* 165* 219*    BNP Invalid input(s): "POCBNP" D-Dimer No results for input(s): "DDIMER" in the last 72 hours. Hemoglobin A1C No results for input(s): "HGBA1C" in the last 72 hours. Fasting  Lipid Panel No results for input(s): "CHOL", "HDL", "LDLCALC", "TRIG", "CHOLHDL", "LDLDIRECT" in the last 72 hours. Thyroid Function Tests No results for input(s): "TSH", "T4TOTAL", "T3FREE", "THYROIDAB" in the last 72 hours.  Invalid input(s): "FREET3" _____________  CARDIAC CATHETERIZATION  Result Date: 09/28/2021 Images from the original result were not included.   Prox LAD to Mid LAD lesion is 100% stenosed.   Ost LAD to Prox LAD lesion is 100% stenosed.   Ramus lesion is 100% stenosed.   Prox RCA lesion is 100% stenosed.   Prox RCA to Dist RCA lesion is 100% stenosed.  Ost Cx to Prox Cx lesion is 30% stenosed.   Dist Graft to Insertion lesion is 99% stenosed.   A drug-eluting stent was successfully placed using a SYNERGY XD 3.50X16.   Post intervention, there is a 0% residual stenosis. VLAD MAYBERRY is a 76 y.o. male  [id number] LOCATION:  FACILITY: [facility] PHYSICIAN: Quay Burow, M.D. 07-04-1945 DATE OF PROCEDURE:  09/28/2021 DATE OF DISCHARGE: CARDIAC CATHETERIZATION / PCI DES PDA SVG History obtained from chart review.76 y.o. male with a hx of hypertension, melanoma, CAD s/p PCI (stent to distal RCA 2003, stents to RCA and LAD in 2004, stent to proximal mid and distal LAD 2005) and CABG (2006), mild ascending aortic aneurysm, chronic systolic and diastolic heart failure, hyperlipidemia, paroxysmal atrial fibrillation, and OSA (not compliant with CPAP due to sinus issues) who was admitted 09/25/2021 for atypical chest pain and elevated troponin.  His Coumadin was held.  He is placed on IV heparin.  He said no further chest pain.  He presents now for diagnostic coronary angiography. PROCEDURE DESCRIPTION: The patient was brought to the second floor [place] Cardiac cath lab in the postabsorptive state. He was not premedicated. His left groin was prepped and shaved in usual sterile fashion. Xylocaine 1% was used for local anesthesia. A 5 French sheath was inserted into the left common femoral artery  using standard Seldinger technique.  Ultrasound was used to identify the left common femoral artery and guide access.  A digital image was captured and placed the patient's chart.  5 French right left Judkins diagnostic catheters were used for selective coronary angiography, vein graft and IMA graft angiography and obtain left heart pressures.  Isovue dye was used for the entirety of the case (145 cc of contrast total to patient).  Retrograde aortic, ventricular and pullback pressures were recorded. Patient received 600 mg of crushed p.o. clopidogrel along with Pepcid 20 mg IV.  Isovue dye was used for the entirety of the intervention.  Retroaortic pressures monitored in the case.  The patient received 11,000's of heparin with an ACT of 299. Using a 6 [place] JR4 guide catheter along the 0.14 [place] guidewire a 2 mm x 12 mm balloon the distal PDA SVG lesion was crossed without difficulty.  The wire was then passed into the PDA.  Predilatation was performed with a 2 mm x 12 mm balloon resulting reduction of a 99% stenosis to approximately 50% residual.  Following this a 3.5 mm x 16 mm long Synergy drug-eluting stent was then carefully positioned and deployed across the diseased segment at 14 atm (3.7 mm) resulting in reduction of a 99% distal PDA SVG stenosis with TIMI I flow to 0% residual TIMI-3 flow.  The patient tolerated the procedure well.  There were no hemodynamic or electrocardiographic sequelae.  The guidewire and catheter were removed.  The sheath was secured in place.   Successful PCI drug-eluting stenting of a high-grade distal PDA SVG stenosis.  He did have grade 2 left to right collaterals and normal LV function.  The sheath will be removed removed once ACT falls below and 70 pressure held.  He will be hydrated overnight.  He will need "triple therapy" with aspirin, clopidogrel and Coumadin for 30 days after which aspirin can be discontinued.  Clopidogrel can be continued for 12 months.  After that  clopidogrel can be discontinued and aspirin restarted.  He left the lab in stable condition. Quay Burow. MD, [facility] 09/28/2021 8:59 AM    ECHOCARDIOGRAM COMPLETE  Result  Date: 09/26/2021    ECHOCARDIOGRAM REPORT   Patient Name:   WORTHINGTON CRUZAN [facility] Date of Exam: 09/26/2021 Medical Rec #:  [id number]         Height:       70.0 in Accession #:    [id number]        Weight:       246.7 lb Date of Birth:  01-17-1945          BSA:          2.282 m Patient Age:    26 years          BP:           134/78 mmHg Patient Gender: M                 HR:           58 bpm. Exam Location:  Inpatient Procedure: 2D Echo Indications:    chest pain  History:        Patient has prior history of Echocardiogram examinations, most                 recent 09/19/2020. CAD, Arrythmias:Atrial Fibrillation; Risk                 Factors:Hypertension, Dyslipidemia and Sleep Apnea.  Sonographer:    Johny Chess RDCS Referring Phys: [id number] [place]  1. Left ventricular ejection fraction, by estimation, is 50 to 55%. The left ventricle has low normal function. The left ventricle has no regional wall motion abnormalities. There is moderate left ventricular hypertrophy. Left ventricular diastolic parameters are consistent with Grade II diastolic dysfunction (pseudonormalization).  2. Right ventricular systolic function is normal. The right ventricular size is normal. There is normal pulmonary artery systolic pressure. The estimated right ventricular systolic pressure is 30.8 mmHg.  3. No evidence of mitral valve regurgitation. Moderate to severe mitral annular calcification.  4. There is mild calcification of the aortic valve. Aortic valve regurgitation is not visualized. No aortic stenosis is present.  5. The inferior vena cava is normal in size with greater than 50% respiratory variability, suggesting right atrial pressure of 3 mmHg. Comparison(s): No significant change from prior study. FINDINGS  Left Ventricle: Left ventricular  ejection fraction, by estimation, is 50 to 55%. The left ventricle has low normal function. The left ventricle has no regional wall motion abnormalities. The left ventricular internal cavity size was normal in size. There is moderate left ventricular hypertrophy. Left ventricular diastolic parameters are consistent with Grade II diastolic dysfunction (pseudonormalization). Right Ventricle: The right ventricular size is normal. No increase in right ventricular wall thickness. Right ventricular systolic function is normal. There is normal pulmonary artery systolic pressure. The tricuspid regurgitant velocity is 2.56 m/s, and  with an assumed right atrial pressure of 3 mmHg, the estimated right ventricular systolic pressure is 65.7 mmHg. Left Atrium: Left atrial size was normal in size. Right Atrium: Right atrial size was normal in size. Pericardium: There is no evidence of pericardial effusion. Mitral Valve: Moderate to severe mitral annular calcification. No evidence of mitral valve regurgitation. Tricuspid Valve: The tricuspid valve is normal in structure. Tricuspid valve regurgitation is not demonstrated. Aortic Valve: There is mild calcification of the aortic valve. Aortic valve regurgitation is not visualized. No aortic stenosis is present. Pulmonic Valve: The pulmonic valve was normal in structure. Pulmonic valve regurgitation is not visualized. Aorta: The aortic root and ascending aorta are structurally normal, with no evidence of dilitation. Venous:  The inferior vena cava is normal in size with greater than 50% respiratory variability, suggesting right atrial pressure of 3 mmHg. IAS/Shunts: No atrial level shunt detected by color flow Doppler.  LEFT VENTRICLE PLAX 2D LVIDd:         5.60 cm   Diastology LVIDs:         3.40 cm   LV e' medial:    4.13 cm/s LV PW:         1.60 cm   LV E/e' medial:  24.0 LV IVS:        1.20 cm   LV e' lateral:   7.94 cm/s LVOT diam:     2.20 cm   LV E/e' lateral: 12.5 LV SV:          86 LV SV Index:   38        2D Longitudinal Strain LVOT Area:     3.80 cm  2D Strain GLS Avg:     -9.8 %  RIGHT VENTRICLE             IVC RV S prime:     11.20 cm/s  IVC diam: 2.00 cm TAPSE (M-mode): 1.4 cm LEFT ATRIUM             Index        RIGHT ATRIUM           Index LA diam:        5.50 cm 2.41 cm/m   RA Area:     16.20 cm LA Vol (A2C):   61.1 ml 26.77 ml/m  RA Volume:   39.60 ml  17.35 ml/m LA Vol (A4C):   73.6 ml 32.25 ml/m LA Biplane Vol: 71.3 ml 31.24 ml/m  AORTIC VALVE LVOT Vmax:   105.00 cm/s LVOT Vmean:  66.900 cm/s LVOT VTI:    0.227 m  AORTA Ao Root diam: 3.00 cm Ao Asc diam:  3.60 cm MITRAL VALVE               TRICUSPID VALVE MV Area (PHT): 3.00 cm    TR Peak grad:   26.2 mmHg MV Decel Time: 253 msec    TR Vmax:        256.00 cm/s MV E velocity: 99.20 cm/s MV A velocity: 40.20 cm/s  SHUNTS MV E/A ratio:  2.47        Systemic VTI:  0.23 m                            Systemic Diam: 2.20 cm Phineas Inches Electronically signed by Phineas Inches Signature Date/Time: 09/26/2021/1:23:02 PM    Final    DG Chest Port 1 View  Result Date: 09/25/2021 CLINICAL DATA:  Chest pain EXAM: PORTABLE CHEST 1 VIEW COMPARISON:  05/03/2017 FINDINGS: Prior CABG. Cardiomegaly. Vascular congestion. Increased interstitial markings peripherally and in the lung bases are similar prior study. Favor chronic lung disease although a component of edema cannot be completely excluded. No effusions or acute bony abnormality. IMPRESSION: Cardiomegaly, vascular congestion. Continued interstitial prominence peripherally and in the lung bases, chronic lung disease most likely. Cannot exclude superimposed edema. Electronically Signed   By: Rolm Baptise M.D.   On: 09/25/2021 10:43    Disposition   Pt is being discharged home today in good condition.  Follow-up Plans & Appointments     Follow-up Information     Loel Dubonnet, NP Follow up on 10/14/2021.   Specialty: Cardiology  Why: Appointment at 8:25 AM Contact  information: [address] [place] [address] [place] [facility] Follow up on 10/05/2021.   Specialty: Cardiology Why: Coumadin Clinic Appointment at 1:30 PM Contact information: [address] [place] [id number] [place] [phone]               Discharge Instructions     Amb Referral to Cardiac Rehabilitation   Complete by: As directed    Diagnosis: Coronary Stents   After initial evaluation and assessments completed: Virtual Based Care may be provided alone or in conjunction with Phase 2 Cardiac Rehab based on patient barriers.: Yes   Intensive Cardiac Rehabilitation (ICR) [place] location only OR Traditional Cardiac Rehabilitation (TCR) *If criteria for ICR are not met will enroll in TCR [facility] only): Yes   Diet - low sodium heart healthy   Complete by: As directed    Discharge instructions   Complete by: As directed    Radial Site Care Refer to this sheet in the next few weeks. These instructions provide you with information on caring for yourself after your procedure. Your caregiver may also give you more specific instructions. Your treatment has been planned according to current medical practices, but problems sometimes occur. Call your caregiver if you have any problems or questions after your procedure. HOME CARE INSTRUCTIONS You may shower the day after the procedure. Remove the bandage (dressing) and gently wash the site with plain soap and water. Gently pat the site dry.  Do not apply powder or lotion to the site.  Do not submerge the affected site in water for 3 to 5 days.  Inspect the site at least twice daily.  Do not flex or bend the affected arm for 24 hours.  No lifting over 5 pounds (2.3 kg) for 5 days after your procedure.  Do not drive home if you are discharged the same day of the procedure. Have someone else drive you.  You may drive 24 hours  after the procedure unless otherwise instructed by your caregiver.  What to expect: Any bruising will usually fade within 1 to 2 weeks.  Blood that collects in the tissue (hematoma) may be painful to the touch. It should usually decrease in size and tenderness within 1 to 2 weeks.  SEEK IMMEDIATE MEDICAL CARE IF: You have unusual pain at the radial site.  You have redness, warmth, swelling, or pain at the radial site.  You have drainage (other than a small amount of blood on the dressing).  You have chills.  You have a fever or persistent symptoms for more than 72 hours.  You have a fever and your symptoms suddenly get worse.  Your arm becomes pale, cool, tingly, or numb.  You have heavy bleeding from the site. Hold pressure on the site.   Discharge instructions   Complete by: As directed    Groin Site Care Refer to this sheet in the next few weeks. These instructions provide you with information on caring for yourself after your procedure. Your caregiver may also give you more specific instructions. Your treatment has been planned according to current medical practices, but problems sometimes occur. Call your caregiver if you have any problems or questions after your procedure. HOME CARE INSTRUCTIONS You may shower 24 hours after the procedure. Remove the bandage (dressing) and gently wash the site with plain soap and water.  Gently pat the site dry.  Do not apply powder or lotion to the site.  Do not sit in a bathtub, swimming pool, or whirlpool for 5 to 7 days.  No bending, squatting, or lifting anything over 10 pounds (4.5 kg) as directed by your caregiver.  Inspect the site at least twice daily.  Do not drive home if you are discharged the same day of the procedure. Have someone else drive you.  You may drive 24 hours after the procedure unless otherwise instructed by your caregiver.  What to expect: Any bruising will usually fade within 1 to 2 weeks.  Blood that collects in the tissue  (hematoma) may be painful to the touch. It should usually decrease in size and tenderness within 1 to 2 weeks.  SEEK IMMEDIATE MEDICAL CARE IF: You have unusual pain at the groin site or down the affected leg.  You have redness, warmth, swelling, or pain at the groin site.  You have drainage (other than a small amount of blood on the dressing).  You have chills.  You have a fever or persistent symptoms for more than 72 hours.  You have a fever and your symptoms suddenly get worse.  Your leg becomes pale, cool, tingly, or numb.  You have heavy bleeding from the site. Hold pressure on the site. .   Increase activity slowly   Complete by: As directed        Discharge Medications   Allergies as of 09/29/2021       Reactions   Statins Other (See Comments)   Muscle pain        Medication List     STOP taking these medications    cephALEXin 500 MG capsule Commonly known as: KEFLEX   doxycycline 100 MG capsule Commonly known as: VIBRAMYCIN   sulfamethoxazole-trimethoprim 400-80 MG tablet Commonly known as: BACTRIM       TAKE these medications    amLODipine 2.5 MG tablet Commonly known as: NORVASC TAKE 1 TABLET BY MOUTH EVERY DAY   aspirin 81 MG chewable tablet Chew 1 tablet (81 mg total) by mouth daily. For 30 days. Start taking on: September 30, 2021   calcium carbonate 600 MG Tabs tablet Commonly known as: OS-CAL Take 600 mg by mouth daily with breakfast.   carvedilol 25 MG tablet Commonly known as: COREG TAKE 1 TABLET BY MOUTH TWICE A DAY   cetirizine 10 MG tablet Commonly known as: ZYRTEC Take 10 mg by mouth daily.   clopidogrel 75 MG tablet Commonly known as: PLAVIX Take 1 tablet (75 mg total) by mouth daily with breakfast. Take every day for 1 year Start taking on: September 30, 2021   Co Q 10 100 MG Caps Take 300 mg by mouth daily.   famotidine 20 MG tablet Commonly known as: PEPCID Take 20 mg by mouth 2 (two) times daily.   Flonase Allergy  Relief 50 MCG/ACT nasal spray Generic drug: fluticasone 1 spray in each nostril Nasally Once a day for 30 day(s)   hydrochlorothiazide 12.5 MG tablet Commonly known as: HYDRODIURIL TAKE 1 TABLET BY MOUTH EVERY DAY   levothyroxine 137 MCG tablet Commonly known as: SYNTHROID Take 137-205.5 mcg by mouth See admin instructions. Take 205.5 mcg by mouth on Wednesday.  Take 137 mcg by mouth daily on all other days   losartan 100 MG tablet Commonly known as: COZAAR TAKE 1 TABLET BY MOUTH EVERY DAY   metFORMIN 500 MG tablet Commonly known as: GLUCOPHAGE Take 500 mg  by mouth 2 (two) times daily.   multivitamin with minerals Tabs tablet Take 1 tablet by mouth daily. Centrum silver   nitroGLYCERIN 0.4 MG SL tablet Commonly known as: NITROSTAT Place 1 tablet (0.4 mg total) under the tongue every 5 (five) minutes x 3 doses as needed for chest pain.   pantoprazole 40 MG tablet Commonly known as: PROTONIX Take 40 mg by mouth 2 (two) times daily.   predniSONE 5 MG tablet Commonly known as: DELTASONE Take 5 mg by mouth as needed (for wagoners).   Repatha SureClick 657 MG/ML Soaj Generic drug: Evolocumab INJECT 140 MG INTO THE SKIN EVERY 14 (FOURTEEN) DAYS. INJECT 1 PEN INTO THE FATTY TISSUE SKIN EVERY 14 DAY   RITUXAN IV Inject 1 application into the vein every 6 (six) months.   sodium chloride 0.65 % Soln nasal spray Commonly known as: OCEAN Place 1 spray into both nostrils daily as needed for congestion.   testosterone cypionate 200 MG/ML injection Commonly known as: DEPOTESTOSTERONE CYPIONATE Inject 100 mg into the muscle every 14 (fourteen) days.   venlafaxine XR 150 MG 24 hr capsule Commonly known as: EFFEXOR-XR Take 150 mg by mouth daily.   Vitamin D3 125 MCG (5000 UT) Tabs Take 5,000 Units by mouth daily.   warfarin 5 MG tablet Commonly known as: COUMADIN Take as directed. If you are unsure how to take this medication, talk to your nurse or doctor. Original  instructions: TAKE 1/2 TO 1 TABLET BY MOUTH EVERY DAY OR AS DIRECTED BY COUMADIN CLINIC           Outstanding Labs/Studies     Duration of Discharge Encounter   Greater than 30 minutes including physician time.  Signed, Margie Billet, PA-C 09/29/2021, 10:39 AM

## 2021-09-29 NOTE — Progress Notes (Signed)
Mobility Specialist - Progress Note   09/29/21 0957  Mobility  Activity Ambulated with assistance in hallway  Level of Assistance Standby assist, set-up cues, supervision of patient - no hands on  Assistive Device None  Distance Ambulated (ft) 1000 ft  Activity Response Tolerated well  $Mobility charge 1 Mobility   Pt was received in hallway and agreeable. Pt c/o slight SOB d/t deconditioning. Pt was returned to room with all needs met.   Larey Seat

## 2021-09-29 NOTE — Progress Notes (Signed)
CARDIAC REHAB PHASE I   PRE:  Rate/Rhythm: 72 NSR  BP:  Sitting: 129/70      SaO2: RA  MODE:  Ambulation: 420 ft   POST:  Rate/Rhythm: 85 NSR  BP:  Sitting: 129/60      SaO2: RA   Pt was ambulated through hallways with standby assistance. Pt reported SOB 5/10 w/o other symptoms. Pt as returned to room and  educated on stent procedure, restrictions, Aspirin, Plavix, and Coumadin med use, ex guidelines, NTG use, heart healthy & diabetic diet, and CRPII. Pt is being referred to CRPII at [facility].    Christen Bame  9:10 AM 09/29/2021

## 2021-09-29 NOTE — Progress Notes (Signed)
Rounding Note    Patient Name: Gary [name] Date of Encounter: 09/29/2021  [place] Cardiologist: [name], MD   Subjective  Ready for d/c post intervention  Inpatient Medications    Scheduled Meds:  amLODipine  2.5 mg Oral Daily   aspirin  81 mg Oral Daily   carvedilol  25 mg Oral BID   clopidogrel  75 mg Oral Q breakfast   hydrochlorothiazide  12.5 mg Oral Daily   levothyroxine  137 mcg Oral Once per day on Sun Mon Tue Thu Fri Sat   [START ON 09/30/2021] levothyroxine  205.5 mcg Oral Once per day on Wed   losartan  100 mg Oral Daily   pantoprazole  40 mg Oral BID   regadenoson  0.4 mg Intravenous Once   sodium chloride flush  3 mL Intravenous Q12H   sulfamethoxazole-trimethoprim  1 tablet Oral Daily   venlafaxine XR  150 mg Oral Daily   Warfarin - Pharmacist Dosing Inpatient   Does not apply q1600   Continuous Infusions:  sodium chloride     PRN Meds: sodium chloride, acetaminophen, morphine injection, nitroGLYCERIN, ondansetron (ZOFRAN) IV, sodium chloride flush   Vital Signs    Vitals:   09/28/21 1738 09/28/21 1808 09/28/21 2108 09/29/21 0555  BP: (!) 115/56 109/61 (!) 101/58 137/61  Pulse:   60 (!) 58  Resp:   20 20  Temp:   98.2 F (36.8 C) 97.9 F (36.6 C)  TempSrc:   Oral Oral  SpO2: 95% 96% 96%   Weight:      Height:        Intake/Output Summary (Last 24 hours) at 09/29/2021 0824 Last data filed at 09/28/2021 1800 Gross per 24 hour  Intake 550 ml  Output 550 ml  Net 0 ml      09/28/2021    5:12 AM 09/25/2021   10:20 PM 09/11/2021    9:09 AM  Last 3 Weights  Weight (lbs) 243 lb 4.8 oz 246 lb 11.2 oz 251 lb  Weight (kg) 110.36 kg 111.902 kg 113.853 kg      Telemetry    SR - Personally Reviewed  ECG    Sinus brady, HR 57 - Personally Reviewed  Physical Exam   GEN: No acute distress.   Neck: No JVD Cardiac: RRR, no murmurs, rubs, or gallops.  Respiratory: Clear to auscultation bilaterally. GI: Soft,  nontender, non-distended  MS: No edema; No deformity. Neuro:  Nonfocal  Psych: Normal affect  Left femoral cath site A no hematioma  Labs    High Sensitivity Troponin:   Recent Labs  Lab 09/25/21 1030 09/25/21 1210 09/25/21 1632 09/25/21 1800  TROPONINIHS 80* 96* 165* 219*     Chemistry Recent Labs  Lab 09/25/21 1030 09/26/21 0344 09/28/21 0230 09/29/21 0230  NA 139 139 140 139  K 3.4* 3.4* 3.5 3.4*  CL 105 102 105 107  CO2 _0 GLUCOSE 160* 112* 151* 143*  BUN 20 21 26* 18  CREATININE 0.93 0.88 1.18 1.16  CALCIUM 8.5* 9.4 9.7 8.7*  PROT 5.8*  --   --   --   ALBUMIN 3.2*  --   --   --   AST 28  --   --   --   ALT 20  --   --   --   ALKPHOS 83  --   --   --   BILITOT 0.5  --   --   --  GFRNONAA >60 >60 >60 >60  ANIONGAP _0 Lipids  Lab Results  Component Value Date   CHOL 155 05/26/2020   HDL 45 05/26/2020   LDLCALC 79 05/26/2020   TRIG 184 (H) 05/26/2020   CHOLHDL 3.4 05/26/2020     Hematology Recent Labs  Lab 09/27/21 0723 09/28/21 0230 09/29/21 0230  WBC 8.8 8.8 7.6  RBC 4.68 4.58 4.20*  HGB 12.5* 12.3* 11.2*  HCT 39.2 38.2* 35.0*  MCV 83.8 83.4 83.3  MCH 26.7 26.9 26.7  MCHC 31.9 32.2 32.0  RDW 15.3 15.5 15.4  PLT 336 321 297   Thyroid No results for input(s): "TSH", "FREET4" in the last 168 hours.  BNPNo results for input(s): "BNP", "PROBNP" in the last 168 hours.  DDimer No results for input(s): "DDIMER" in the last 168 hours.   Radiology    CARDIAC CATHETERIZATION  Result Date: 09/28/2021 Images from the original result were not included.   Prox LAD to Mid LAD lesion is 100% stenosed.   Ost LAD to Prox LAD lesion is 100% stenosed.   Ramus lesion is 100% stenosed.   Prox RCA lesion is 100% stenosed.   Prox RCA to Dist RCA lesion is 100% stenosed.   Ost Cx to Prox Cx lesion is 30% stenosed.   Dist Graft to Insertion lesion is 99% stenosed.   A drug-eluting stent was successfully placed using a SYNERGY XD [id number].   Post  intervention, there is a 0% residual stenosis. Gary [name] is a 76 y.o. male  [id number] LOCATION:  FACILITY: [facility] PHYSICIAN: Quay Burow, M.D. [birth year] DATE OF PROCEDURE:  09/28/2021 DATE OF DISCHARGE: CARDIAC CATHETERIZATION / PCI DES PDA SVG History obtained from chart review.76 y.o. male with a hx of hypertension, melanoma, CAD s/p PCI (stent to distal RCA 2003, stents to RCA and LAD in 2004, stent to proximal mid and distal LAD 2005) and CABG (2006), mild ascending aortic aneurysm, chronic systolic and diastolic heart failure, hyperlipidemia, paroxysmal atrial fibrillation, and OSA (not compliant with CPAP due to sinus issues) who was admitted 09/25/2021 for atypical chest pain and elevated troponin.  His Coumadin was held.  He is placed on IV heparin.  He said no further chest pain.  He presents now for diagnostic coronary angiography. PROCEDURE DESCRIPTION: The patient was brought to the second floor [facility] cath lab in the postabsorptive state. He was not premedicated. His left groin was prepped and shaved in usual sterile fashion. Xylocaine 1% was used for local anesthesia. A 5 French sheath was inserted into the left common femoral artery using standard Seldinger technique.  Ultrasound was used to identify the left common femoral artery and guide access.  A digital image was captured and placed the patient's chart.  5 French right left Judkins diagnostic catheters were used for selective coronary angiography, vein graft and IMA graft angiography and obtain left heart pressures.  Isovue dye was used for the entirety of the case (145 cc of contrast total to patient).  Retrograde aortic, ventricular and pullback pressures were recorded. Patient received 600 mg of crushed p.o. clopidogrel along with Pepcid 20 mg IV.  Isovue dye was used for the entirety of the intervention.  Retroaortic pressures monitored in the case.  The patient received 11,000's of heparin with an ACT of 299. Using a 6  [place] JR4 guide catheter along the 0.14 [place] guidewire a 2 mm x 12 mm balloon the distal PDA SVG lesion was crossed without  difficulty.  The wire was then passed into the PDA.  Predilatation was performed with a 2 mm x 12 mm balloon resulting reduction of a 99% stenosis to approximately 50% residual.  Following this a 3.5 mm x 16 mm long Synergy drug-eluting stent was then carefully positioned and deployed across the diseased segment at 14 atm (3.7 mm) resulting in reduction of a 99% distal PDA SVG stenosis with TIMI I flow to 0% residual TIMI-3 flow.  The patient tolerated the procedure well.  There were no hemodynamic or electrocardiographic sequelae.  The guidewire and catheter were removed.  The sheath was secured in place.   Successful PCI drug-eluting stenting of a high-grade distal PDA SVG stenosis.  He did have grade 2 left to right collaterals and normal LV function.  The sheath will be removed removed once ACT falls below and 70 pressure held.  He will be hydrated overnight.  He will need "triple therapy" with aspirin, clopidogrel and Coumadin for 30 days after which aspirin can be discontinued.  Clopidogrel can be continued for 12 months.  After that clopidogrel can be discontinued and aspirin restarted.  He left the lab in stable condition. Quay Burow. MD, [facility] 09/28/2021 8:59 AM     Cardiac Studies   ECHO: EF 50-55% MAC / AV sclerosis   Patient Profile     76 y.o. male with a hx of hypertension, melanoma, CAD s/p PCI (stent to distal RCA 2003, stents to RCA and LAD in 2004, stent to proximal mid and distal LAD 2005) and CABG (2006), mild ascending aortic aneurysm, chronic systolic and diastolic heart failure, hyperlipidemia, paroxysmal atrial fibrillation, and OSA (not compliant with CPAP due to sinus issues) who was admitted 09/25/2021 for atypical chest pain and elevated troponin.  Assessment & Plan    NSTEMI - Positive troponin  - he had a minor re-occurrence of pain, and with  trop > 200,  cath  this am  - heparin -  EF 50-55% on TTE  - Cardiac catheterization 09/28/21 with stent to distal limb of SVG to PDA  D/C home Coumadin appointment Friday had dose last night    For questions or updates, please contact [place] Please consult [email or web address] for contact info under      Signed, Jenkins Rouge, MD  09/29/2021, 8:24 AM  \

## 2021-09-29 NOTE — Progress Notes (Addendum)
ANTICOAGULATION CONSULT NOTE - Follow Up  Pharmacy Consult for warfarin  Indication: chest pain/ACS  Allergies  Allergen Reactions   Statins Other (See Comments)    Muscle pain    Patient Measurements: Height: '5\' 10"'$  (177.8 cm) Weight: 110.4 kg (243 lb 4.8 oz) IBW/kg (Calculated) : 73 Heparin Dosing Weight: 97.4kg  Vital Signs: Temp: 97.9 F (36.6 C) (09/26 0555) Temp Source: Oral (09/26 0555) BP: 129/60 (09/26 0828) Pulse Rate: 58 (09/26 0555)  Labs: Recent Labs    09/26/21 1222 09/26/21 2248 09/27/21 0723 09/28/21 0230 09/29/21 0230  HGB   < >  --  12.5* 12.3* 11.2*  HCT  --   --  39.2 38.2* 35.0*  PLT  --   --  336 321 297  LABPROT  --   --  17.5* 16.3* 14.4  INR  --   --  1.5* 1.3* 1.1  HEPARINUNFRC  --  0.22* 0.30 0.41  --   CREATININE  --   --   --  1.18 1.16   < > = values in this interval not displayed.     Estimated Creatinine Clearance: 65.3 mL/min (by C-G formula based on SCr of 1.16 mg/dL).   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   09/25/2021   carvedilol (COREG) 25 MG tablet TAKE 1 TABLET BY MOUTH TWICE A DAY 180 tablet 3 09/25/2021 at 0730   cetirizine (ZYRTEC) 10 MG tablet Take 10  mg by mouth daily.   09/25/2021   Cholecalciferol (VITAMIN D3) 125 MCG (5000 UT) TABS Take 5,000 Units by mouth daily.   09/25/2021   Coenzyme Q10 (CO Q 10) 100 MG CAPS Take 300 mg by mouth daily.    09/25/2021   famotidine (PEPCID) 20 MG tablet Take 20 mg by mouth 2 (two) times daily.   09/25/2021   fluticasone (FLONASE ALLERGY RELIEF) 50 MCG/ACT nasal spray 1 spray in each nostril Nasally Once a day for 30 day(s)   unk   hydrochlorothiazide (HYDRODIURIL) 12.5 MG tablet TAKE 1 TABLET BY MOUTH EVERY DAY 90 tablet 1 09/25/2021   levothyroxine (SYNTHROID, LEVOTHROID) 137 MCG tablet Take 137-205.5 mcg by mouth See admin instructions. Take 205.5 mcg by mouth on Wednesday.  Take 137 mcg by mouth daily on all other days   09/25/2021   losartan (COZAAR) 100 MG tablet TAKE 1 TABLET BY MOUTH EVERY DAY 90 tablet 3 09/25/2021   metFORMIN (GLUCOPHAGE) 500 MG tablet Take 500 mg by mouth 2 (two) times daily.  12 09/25/2021   Multiple Vitamin (MULTIVITAMIN WITH MINERALS) TABS tablet Take 1 tablet by mouth daily. Centrum silver   09/24/2021  pantoprazole (PROTONIX) 40 MG tablet Take 40 mg by mouth 2 (two) times daily.   09/25/2021   predniSONE (DELTASONE) 5 MG tablet Take 5 mg by mouth as needed (for wagoners).   0/99/8338   REPATHA SURECLICK 250 MG/ML SOAJ INJECT 140 MG INTO THE SKIN EVERY 14 (FOURTEEN) DAYS. INJECT 1 PEN INTO THE FATTY TISSUE SKIN EVERY 14 DAY 6 mL 3 Past Month   riTUXimab (RITUXAN IV) Inject 1 application into the vein every 6 (six) months.    Past Month   sodium chloride (OCEAN) 0.65 % SOLN nasal spray Place 1 spray into both nostrils daily as needed for congestion.    unk   sulfamethoxazole-trimethoprim (BACTRIM) 400-80 MG tablet Take 1 tablet by mouth daily.   09/25/2021   testosterone cypionate (DEPOTESTOSTERONE CYPIONATE) 200 MG/ML injection Inject 100 mg into the muscle every 14 (fourteen) days.   Past Week   venlafaxine XR (EFFEXOR-XR) 150 MG 24 hr capsule Take 150 mg by mouth daily.    09/25/2021    warfarin (COUMADIN) 5 MG tablet TAKE 1/2 TO 1 TABLET BY MOUTH EVERY DAY OR AS DIRECTED BY COUMADIN CLINIC 90 tablet 0 09/25/2021 at 0730   cephALEXin (KEFLEX) 500 MG capsule Take by mouth every 8 (eight) hours. (Patient not taking: Reported on 09/25/2021)   Completed Course   doxycycline (VIBRAMYCIN) 100 MG capsule Take 100 mg by mouth 2 (two) times daily. (Patient not taking: Reported on 09/25/2021)   Completed Course   Scheduled:   amLODipine  2.5 mg Oral Daily   aspirin  81 mg Oral Daily   carvedilol  25 mg Oral BID   clopidogrel  75 mg Oral Q breakfast   hydrochlorothiazide  12.5 mg Oral Daily   levothyroxine  137 mcg Oral Once per day on Sun Mon Tue Thu Fri Sat   [START ON 09/30/2021] levothyroxine  205.5 mcg Oral Once per day on Wed   losartan  100 mg Oral Daily   pantoprazole  40 mg Oral BID   regadenoson  0.4 mg Intravenous Once   sodium chloride flush  3 mL Intravenous Q12H   sulfamethoxazole-trimethoprim  1 tablet Oral Daily   venlafaxine XR  150 mg Oral Daily   Warfarin - Pharmacist Dosing Inpatient   Does not apply q1600   Assessment: 51 yom with a history of HTN, melanoma, CAD s/p PCI, mild ascending aortic aneurysm, HF, HLD, AF on coumadin, OSA. Patient is presenting with chest pain. Pharmacy consulted to dose heparin for NSTEMI - heparin stopped post-cath, will resume warfarin per cardiology.   INR is subtherapeutic at 1.1. CBC stable this morning. No s/sx of bleeding. Of note, on bactrim PTA daily (was on PTA since 2019). Will give boosted dose today prior to discharge then resume home dose tomorrow 9/27. INR appt set for 10/2.   PTA regimen is 5 mg daily except 2.5 mg MF. Last dose prior to admit on 09/25/2021.  Goal of Therapy:  INR 2-3 Monitor platelets by anticoagulation protocol: Yes  Plan:  Will order warfarin 10 mg today prior to d/c (boosted dose from PTA since has been held) Monitor daily INR, CBC, and for s/sx of bleeding   Erin Hearing PharmD.,  BCPS Clinical Pharmacist 09/29/2021 8:32 AM

## 2021-09-30 ENCOUNTER — Encounter (HOSPITAL_BASED_OUTPATIENT_CLINIC_OR_DEPARTMENT_OTHER): Payer: Medicare Other | Admitting: Cardiology

## 2021-09-30 DIAGNOSIS — G4733 Obstructive sleep apnea (adult) (pediatric): Secondary | ICD-10-CM

## 2021-10-01 ENCOUNTER — Telehealth: Payer: Self-pay | Admitting: Nurse Practitioner

## 2021-10-01 NOTE — Telephone Encounter (Signed)
S/w patient and he is doing very well after his heart catheterization. Denies any complaints. Updated him regarding his sleep study and he stated he does not want to be set up for a CPAP at this time. Said his OSA is "marginal" and he wants to see how he does since starting Ozempic yesterday. Discussed with him regarding activity restrictions after his cardiac cath. He will follow up for his post hospital follow-up on 10/14/21 with Laurann Montana, NP-C. He verbalized understanding of our phone call conversation and was appreciative of my call.   Finis Bud, NP

## 2021-10-01 NOTE — Procedures (Signed)
   Patient Information Study Date: 09/30/21 Patient Name: Gary [name] Patient ID: [id number] Birth Date: 03-16-2045 Age: 76 Gender: Male BMI: 36.0 (W=251 lb, H=5' 10'') Referring Physician: Finis Bud, NP  TEST DESCRIPTION: Home sleep apnea testing was completed using the WatchPat, a Type 1 device, utilizing peripheral arterial tonometry (PAT), chest movement, actigraphy, pulse oximetry, pulse rate, body position and snore. AHI was calculated with apnea and hypopnea using valid sleep time as the denominator. RDI includes apneas, hypopneas, and RERAs. The data acquired and the scoring of sleep and all associated events were performed in accordance with the recommended standards and specifications as outlined in the AASM Manual for the Scoring of Sleep and Associated Events 2.2.0 (2015).   FINDINGS:   1. Mild Obstructive Sleep Apnea with AHI 11.8/hr.   2. No Central Sleep Apnea with pAHIc 0/hr.   3. Oxygen desaturations as low as 79%.   4.  snoring was present. O2 sats were < 88% for 7.4 min.   5. Total sleep time was 5 hrs and 41 min.   6. 15% of total sleep time was spent in REM sleep.   7. Normal sleep onset latency at 17 min.   8. Shortened REM sleep onset latency at 54 min.   9. Total awakenings were 6.  10. 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  Clinical correlation of these findings is necessary.  The decision to treat obstructive sleep apnea (OSA) is usually based on the presence of apnea symptoms or the presence of associated medical conditions such as Hypertension, Congestive Heart Failure, Atrial Fibrillation or Obesity.  The most common symptoms of OSA are snoring, gasping for breath while sleeping, daytime sleepiness and fatigue.   2.  Initiating apnea therapy is recommended given the presence of symptoms and/or associated conditions. Recommend proceeding with one of the  following:     a.  Auto-CPAP therapy with a pressure range of 5-20cm H2O.     b.  An oral appliance (OA) that can be obtained from certain dentists with expertise in sleep medicine.  These are primarily of use in non-obese patients with mild and moderate disease.     c.  An ENT consultation which may be useful to look for specific causes of obstruction and possible treatment options.     d.  If patient is intolerant to PAP therapy, consider referral to ENT for evaluation for hypoglossal nerve stimulator.   3.  Close follow-up is necessary to ensure success with CPAP or oral appliance therapy for maximum benefit.  4.  A follow-up oximetry study on CPAP is recommended to assess the adequacy of therapy and determine the need for supplemental oxygen or the potential need for Bi-level therapy.  An arterial blood gas to determine the adequacy of baseline ventilation and oxygenation should also be considered.  5.  Healthy sleep recommendations include:  adequate nightly sleep (normal 7-9 hrs/night), avoidance of caffeine after noon and alcohol near bedtime, and maintaining a sleep environment that is cool, dark and quiet.  6.  Weight loss for overweight patients is recommended.  Even modest amounts of weight loss can significantly improve the severity of sleep apnea.  7.  Snoring recommendations include:  weight loss where appropriate, side sleeping, and avoidance of alcohol before bed.  8.  Operation of motor vehicle should be avoided when sleepy.  Signature: Fransico Him, MD; [facility]; [place], [place] Board of Sleep Medicine Electronically Signed: 10/01/21

## 2021-10-02 ENCOUNTER — Ambulatory Visit: Payer: Medicare Other | Attending: Nurse Practitioner

## 2021-10-02 ENCOUNTER — Telehealth: Payer: Self-pay | Admitting: *Deleted

## 2021-10-02 DIAGNOSIS — G4730 Sleep apnea, unspecified: Secondary | ICD-10-CM

## 2021-10-02 NOTE — Telephone Encounter (Signed)
-----   Message from [name], MD sent at 10/01/2021 11:59 AM EDT ----- Please let patient know that they have sleep apnea and recommend treating with CPAP.  Please order an auto CPAP from 4-15cm H2O with heated humidity and mask of choice.  Order overnight pulse ox on CPAP.  Followup with me in 6 weeks.

## 2021-10-02 NOTE — Telephone Encounter (Signed)
Patient notified of sleep study results and recommendations. He agrees to proceed with CPAP. Order has been placed in Epic by Dr Radford Pax. Sent to [place]. Brad New and [employer] notified via [employer].

## 2021-10-12 ENCOUNTER — Ambulatory Visit: Payer: Medicare Other | Attending: Cardiovascular Disease

## 2021-10-12 DIAGNOSIS — Z7901 Long term (current) use of anticoagulants: Secondary | ICD-10-CM

## 2021-10-12 DIAGNOSIS — I480 Paroxysmal atrial fibrillation: Secondary | ICD-10-CM

## 2021-10-12 LAB — POCT INR: INR: 2.5 (ref 2.0–3.0)

## 2021-10-12 NOTE — Patient Instructions (Signed)
Continue 1 tablet Daily, EXCEPT 1/2 TABLET Monday and Friday. Recheck in 6 weeks. Please call us at [phone] with any questions.   

## 2021-10-13 NOTE — Progress Notes (Unsigned)
Office Visit    Patient Name: Gary [name] Date of Encounter: 10/14/2021  PCP:  [name], [facility]  Cardiologist:  [name], MD  Advanced Practice Provider:  No care team member to display Electrophysiologist:  None     Chief Complaint    Gary [name] is a 76 y.o. 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   LVEF 45-50% 11/2014   Coronary artery disease    Depression    Essential hypertension 11/13/2014   Hyperlipidemia    Hyperlipidemia 11/13/2014   Hypertension    Sleep apnea    CPAP 'Uses half the time"  last study 2009   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   Past Surgical History:  Procedure Laterality Date   CARDIOVERSION N/A 12/06/2014   Procedure: CARDIOVERSION;  Surgeon: [name], MD;  Location: [place];  Service: Cardiovascular;  Laterality: N/A;   CARDIOVERSION N/A 05/26/2017   Procedure: CARDIOVERSION;  Surgeon: [name], MD;  Location: [place];  Service: Cardiovascular;  Laterality: N/A;   CARDIOVERSION N/A 04/25/2019   Procedure: CARDIOVERSION;  Surgeon: Sanda Klein, MD;  Location: [place] [facility];  Service: Cardiovascular;  Laterality: N/A;   [place]     multiple stents   CORONARY ARTERY BYPASS GRAFT  2006   CORONARY STENT INTERVENTION N/A 09/28/2021   Procedure: CORONARY STENT INTERVENTION;  Surgeon: Lorretta Harp, MD;  Location: [place] [facility];  Service: Cardiovascular;  Laterality: N/A;  SVG-PDA   EYE SURGERY     right eye-  muscular repair   LEFT HEART CATH AND CORS/GRAFTS ANGIOGRAPHY N/A 09/28/2021   Procedure: LEFT HEART CATH AND CORS/GRAFTS  ANGIOGRAPHY;  Surgeon: Lorretta Harp, MD;  Location: [place] [facility];  Service: Cardiovascular;  Laterality: N/A;   TOTAL HIP ARTHROPLASTY  01/03/2012   Procedure: TOTAL HIP ARTHROPLASTY;  Surgeon: [name], MD;  Location: [facility];  Service: Orthopedics;  Laterality: Right;    Allergies  Allergies  Allergen Reactions   Statins Other (See Comments)    Muscle pain    History of Present Illness    Gary [name] is a 76 y.o. male with a hx of HTN, melanoma, DM2, GERD, CAD (2003 stent to distal RCA, 2004 stents to RCA and LAD, 2005 stent to prox mis and distal LAD, 2006 CABG, 2023 stent to SVG-RPDA), combined systolic and diastolic heart failure, HLD, PAF, OSA last seen while hospitalized.  Admitted 09/25/21 with chest pain radiating to throat and jaw. Echo 09/26/21 with LVEF 50-55%, no RWMA, gr2DD, normal PASP, calcification of aortic and mitral valbes without regurgitation nor stenosis. Underwent LHC 9/25 23 with patent LIMA-LAD and SVG-ramus. There was 99% occlusion of SVG-RPDA which was treated with DES. Recommended of raspirin, clopidogrel, coumadin for 30 days then discontinue aspirin. Clopidogrel to be continued for 12 months then transitioned to aspirin.   Presents today for follow up. Feeling well since discharge with no significant chest pain, dyspnea. Has not resumed walking regimen but plans to return to gym at [employer] with his wife. Notes 1 - 1.5 week history of dark stool.  Monitoring BP intermittently at home with readings most often 110s-120s. NO lightheadedness, dizziness ,near syncope, syncope.   EKGs/Labs/Other Studies Reviewed:   The following studies were reviewed today:  Echo 09/26/21  1. Left ventricular ejection fraction, by estimation, is 50 to 55%. The  left ventricle has low normal function. The left ventricle has no regional  wall motion abnormalities. There is moderate left ventricular hypertrophy.  Left ventricular diastolic  parameters are  consistent with Grade II diastolic dysfunction  (pseudonormalization).   2. Right ventricular systolic function is normal. The right ventricular  size is normal. There is normal pulmonary artery systolic pressure. The  estimated right ventricular systolic pressure is 56.3 mmHg.   3. No evidence of mitral valve regurgitation. Moderate to severe mitral  annular calcification.   4. There is mild calcification of the aortic valve. Aortic valve  regurgitation is not visualized. No aortic stenosis is present.   5. The inferior vena cava is normal in size with greater than 50%  respiratory variability, suggesting right atrial pressure of 3 mmHg.   LHC 09/28/21    Prox LAD to Mid LAD lesion is 100% stenosed.   Ost LAD to Prox LAD lesion is 100% stenosed.   Ramus lesion is 100% stenosed.   Prox RCA lesion is 100% stenosed.   Prox RCA to Dist RCA lesion is 100% stenosed.   Ost Cx to Prox Cx lesion is 30% stenosed.   Dist Graft to Insertion lesion is 99% stenosed.   A drug-eluting stent was successfully placed using a SYNERGY XD [id number].   Post intervention, there is a 0% residual stenosis.    Diagnostic Dominance: Right  Intervention    EKG:  EKG is ordered today.  The ekg ordered today demonstrates NSR 64 bpm with LVH and improvement in TWI in inferior leads compared to previous.  Recent Labs: 09/25/2021: ALT 20 09/29/2021: BUN 18; Creatinine, Ser 1.16; Hemoglobin 11.2; Platelets 297; Potassium 3.4; Sodium 139  Recent Lipid Panel    Component Value Date/Time   CHOL 155 05/26/2020 0807   TRIG 184 (H) 05/26/2020 0807   HDL 45 05/26/2020 0807   CHOLHDL 3.4 05/26/2020 0807   CHOLHDL 5.4 (H) 02/19/2015 0821   VLDL 22 02/19/2015 0821   LDLCALC 79 05/26/2020 0807    Risk Assessment/Calculations:   CHA2DS2-VASc Score = 6   This indicates a 9.7% annual risk of stroke. The patient's score is based upon: CHF History: 1 HTN History: 1 Diabetes History: 1 Stroke History: 0 Vascular  Disease History: 1 Age Score: 2 Gender Score: 0     Home Medications   Current Meds  Medication Sig   amLODipine (NORVASC) 2.5 MG tablet TAKE 1 TABLET BY MOUTH EVERY DAY   aspirin 81 MG chewable tablet Chew 1 tablet (81 mg total) by mouth daily. For 30 days.   calcium carbonate (OS-CAL) 600 MG TABS tablet Take 600 mg by mouth daily with breakfast.   carvedilol (COREG) 25 MG tablet TAKE 1 TABLET BY MOUTH TWICE A DAY   cetirizine (ZYRTEC) 10 MG tablet Take 10 mg by mouth daily.   Cholecalciferol (VITAMIN D3) 125 MCG (5000 UT) TABS Take 5,000 Units by mouth daily.   clopidogrel (PLAVIX) 75 MG tablet Take 1 tablet (75 mg total) by mouth daily with breakfast. Take every day for 1 year   Coenzyme Q10 (CO Q 10) 100 MG CAPS Take 300 mg by mouth daily.    hydrochlorothiazide (HYDRODIURIL) 12.5 MG tablet TAKE 1 TABLET BY MOUTH  EVERY DAY   levothyroxine (SYNTHROID, LEVOTHROID) 137 MCG tablet Take 137-205.5 mcg by mouth See admin instructions. Take 205.5 mcg by mouth on Wednesday.  Take 137 mcg by mouth daily on all other days   losartan (COZAAR) 100 MG tablet TAKE 1 TABLET BY MOUTH EVERY DAY   metFORMIN (GLUCOPHAGE) 500 MG tablet Take 500 mg by mouth 2 (two) times daily.   Multiple Vitamin (MULTIVITAMIN WITH MINERALS) TABS tablet Take 1 tablet by mouth daily. Centrum silver   nitroGLYCERIN (NITROSTAT) 0.4 MG SL tablet Place 1 tablet (0.4 mg total) under the tongue every 5 (five) minutes x 3 doses as needed for chest pain.   pantoprazole (PROTONIX) 40 MG tablet Take 1 tablet (40 mg total) by mouth 2 (two) times daily for 7 days, THEN 1 tablet (40 mg total) daily.   predniSONE (DELTASONE) 5 MG tablet Take 5 mg by mouth as needed (for wagoners).   REPATHA SURECLICK 588 MG/ML SOAJ INJECT 140 MG INTO THE SKIN EVERY 14 (FOURTEEN) DAYS. INJECT 1 PEN INTO THE FATTY TISSUE SKIN EVERY 14 DAY   riTUXimab (RITUXAN IV) Inject 1 application into the vein every 6 (six) months.    Semaglutide,0.25 or 0.'5MG'$ /DOS,  (OZEMPIC, 0.25 OR 0.5 MG/DOSE,) 2 MG/3ML SOPN 0.'25mg'$  once weekly for 2 weeks then increase to 0.'5mg'$  weekly for 3 weeks Subcutaneous   testosterone cypionate (DEPOTESTOSTERONE CYPIONATE) 200 MG/ML injection Inject 100 mg into the muscle every 14 (fourteen) days.   venlafaxine XR (EFFEXOR-XR) 150 MG 24 hr capsule Take 150 mg by mouth daily.    warfarin (COUMADIN) 5 MG tablet TAKE 1/2 TO 1 TABLET BY MOUTH EVERY DAY OR AS DIRECTED BY COUMADIN CLINIC     Review of Systems      All other systems reviewed and are otherwise negative except as noted above.  Physical Exam    VS:  BP (!) 96/52   Pulse 64   Ht '5\' 10"'$  (1.778 m)   Wt 250 lb (113.4 kg)   BMI 35.87 kg/m  , BMI Body mass index is 35.87 kg/m.  Wt Readings from Last 3 Encounters:  10/14/21 250 lb (113.4 kg)  09/28/21 243 lb 4.8 oz (110.4 kg)  09/11/21 251 lb (113.9 kg)     GEN: Well nourished, well developed, in no acute distress. HEENT: normal. Neck: Supple, no JVD, carotid bruits, or masses. Cardiac: RRR, no murmurs, rubs, or gallops. No clubbing, cyanosis, edema.  Radials/PT 2+ and equal bilaterally.  Respiratory:  Respirations regular and unlabored, clear to auscultation bilaterally. GI: Soft, nontender, nondistended. MS: No deformity or atrophy. Skin: Warm and dry, no rash. L groin sit without hematoma nor ecchymosis. Neuro:  Strength and sensation are intact. Psych: Normal affect.  Assessment & Plan    CAD s/p CABG and PCI- 09/28/21 DES to SVG-PDA. Recommended for triple therapy x 1 month. No significant chest pain since discharge. L groin site healing appropriately. 1 week history dark stool. CBC, INR, BMP today. Start Protonix '40mg'$  BID x 7 days then QD. Plan to stop Aspirin on 10/29/21. If CBC shows decreased Hb, may need to discontinue sooner. Encouraged to participate in cardiac rehab.   HLD, LDL goal <70 - 08/2021 LDL 75. Continue Repatha. Documented statin intolerance.   PAF / Hypercoagulable state - Maintaining  NSR. Denies palpitations. CHA2DS2-VASc Score = 6 [CHF History: 1, HTN History: 1, Diabetes History: 1, Stroke History: 0, Vascular Disease History: 1, Age Score: 2, Gender Score: 0].  Therefore, the patient's annual risk of stroke  is 9.7 %.    Follows with [facility]. Due to 1 week hx of dark stool - INR and CBC today. May need to consider discontinuing Aspirin early. Rx Protonix '40mg'$  BID x 1 week then daily  Combined systolic and diastolic heart failure with recovered LVEF - 09/2021 EF 50-55%, gr2DD. Euvolemic and well compensated on exam. GDMT Losartan, Carvedilol. No indication for loop diuretic at this time. Heart healthy diet and regular cardiovascular exercise encouraged.    HTN - Now with asymptomatic hypotension. Workup of melena, as above. If SBP <100 tonight, hold Amlodipine.   OSA - Recent sleep study recommended for auto-PAP. CPAP compliance encouraged.   GERD - Continue to follow with PCP.   DM2 - Continue to follow with PCP.          Cardiac Rehabilitation Eligibility Assessment  The patient is ready to start cardiac rehabilitation from a cardiac standpoint.   Disposition: Follow up in 3 month(s) with [name], MD or APP.  Signed, Loel Dubonnet, NP 10/14/2021, 9:09 AM [place]

## 2021-10-14 ENCOUNTER — Encounter (HOSPITAL_BASED_OUTPATIENT_CLINIC_OR_DEPARTMENT_OTHER): Payer: Self-pay | Admitting: Family

## 2021-10-14 ENCOUNTER — Ambulatory Visit (INDEPENDENT_AMBULATORY_CARE_PROVIDER_SITE_OTHER): Payer: Medicare Other | Admitting: Family

## 2021-10-14 VITALS — BP 96/52 | HR 64 | Ht 70.0 in | Wt 250.0 lb

## 2021-10-14 DIAGNOSIS — I5042 Chronic combined systolic (congestive) and diastolic (congestive) heart failure: Secondary | ICD-10-CM

## 2021-10-14 DIAGNOSIS — D6859 Other primary thrombophilia: Secondary | ICD-10-CM

## 2021-10-14 DIAGNOSIS — I480 Paroxysmal atrial fibrillation: Secondary | ICD-10-CM | POA: Diagnosis not present

## 2021-10-14 DIAGNOSIS — E785 Hyperlipidemia, unspecified: Secondary | ICD-10-CM | POA: Diagnosis not present

## 2021-10-14 DIAGNOSIS — I25118 Atherosclerotic heart disease of native coronary artery with other forms of angina pectoris: Secondary | ICD-10-CM | POA: Diagnosis not present

## 2021-10-14 MED ORDER — PANTOPRAZOLE SODIUM 40 MG PO TBEC: DELAYED_RELEASE_TABLET | ORAL | 11 refills | Status: DC

## 2021-10-14 NOTE — Patient Instructions (Signed)
Medication Instructions:  Your physician has recommended you make the following change in your medication:   START Pantoprazole '40mg'$  *take one tablet twice daily for one week, thereafter one tablet daily  TONIGHT if systolic blood pressure (top number) less than 100 - HOLD Amlodipine   *If you need a refill on your cardiac medications before your next appointment, please call your pharmacy*   Lab Work: Your physician recommends that you return for lab work today: INR, CBC, BMP  If you have labs (blood work) drawn today and your tests are completely normal, you will receive your results only by: [place] (if you have MyChart) OR A paper copy in the mail If you have any lab test that is abnormal or we need to change your treatment, we will call you to review the results.   Testing/Procedures: Your EKG today shows your stent is working well.   Follow-Up: At [facility], you and your health needs are our priority.  As part of our continuing mission to provide you with exceptional heart care, we have created designated Provider Care Teams.  These Care Teams include your primary Cardiologist (physician) and Advanced Practice Providers (APPs -  Physician Assistants and Nurse Practitioners) who all work together to provide you with the care you need, when you need it.  We recommend signing up for the patient portal called "MyChart".  Sign up information is provided on this After Visit Summary.  MyChart is used to connect with patients for Virtual Visits (Telemedicine).  Patients are able to view lab/test results, encounter notes, upcoming appointments, etc.  Non-urgent messages can be sent to your provider as well.   To learn more about what you can do with MyChart, go to [email or web address].    Your next appointment:   3 month(s)  The format for your next appointment:   In Person  Provider:   [name], MD or Laurann Montana, NP    Other Instructions  Heart  Healthy Diet Recommendations: A low-salt diet is recommended. Meats should be grilled, baked, or boiled. Avoid fried foods. Focus on lean protein sources like fish or chicken with vegetables and fruits. The American Heart Association is a Microbiologist!  American Heart Association Diet and Lifeystyle Recommendations   Exercise recommendations: The American Heart Association recommends 150 minutes of moderate intensity exercise weekly. Try 30 minutes of moderate intensity exercise 4-5 times per week. This could include walking, jogging, or swimming.   Important Information About Sugar

## 2021-10-15 ENCOUNTER — Telehealth (HOSPITAL_BASED_OUTPATIENT_CLINIC_OR_DEPARTMENT_OTHER): Payer: Self-pay

## 2021-10-15 LAB — CBC
Hematocrit: 36.9 % — ABNORMAL LOW (ref 37.5–51.0)
Hemoglobin: 11.9 g/dL — ABNORMAL LOW (ref 13.0–17.7)
MCH: 27.3 pg (ref 26.6–33.0)
MCHC: 32.2 g/dL (ref 31.5–35.7)
MCV: 85 fL (ref 79–97)
Platelets: 389 10*3/uL (ref 150–450)
RBC: 4.36 x10E6/uL (ref 4.14–5.80)
RDW: 15.3 % (ref 11.6–15.4)
WBC: 11.5 10*3/uL — ABNORMAL HIGH (ref 3.4–10.8)

## 2021-10-15 LAB — BASIC METABOLIC PANEL
BUN/Creatinine Ratio: 22 (ref 10–24)
BUN: 23 mg/dL (ref 8–27)
CO2: 22 mmol/L (ref 20–29)
Calcium: 9.5 mg/dL (ref 8.6–10.2)
Chloride: 102 mmol/L (ref 96–106)
Creatinine, Ser: 1.03 mg/dL (ref 0.76–1.27)
Glucose: 183 mg/dL — ABNORMAL HIGH (ref 70–99)
Potassium: 4.2 mmol/L (ref 3.5–5.2)
Sodium: 140 mmol/L (ref 134–144)
eGFR: 74 mL/min/{1.73_m2} (ref >59)

## 2021-10-15 LAB — PROTIME-INR
INR: 2.1 — ABNORMAL HIGH (ref 0.9–1.2)
Prothrombin Time: 21.3 s — ABNORMAL HIGH (ref 9.1–12.0)

## 2021-10-15 NOTE — Telephone Encounter (Addendum)
Left message for patient to call back    ----- Message from Loel Dubonnet, NP sent at 10/15/2021  7:49 AM EDT ----- Normal kidney function and electrolytes.  CBC shows stable mild anemia.  White blood cell count mildly elevated which is not of concern.  INR of 2.1 which is at goal.  No new or worsening anemia which is reassuring. Continue Plavix, Aspirin, Coumadin. May stop Aspirin 10/29/21. Recommend repeat CBC in 1-2 weeks for monitoring. Start Protonix as instructed in clinic.

## 2021-10-16 NOTE — Telephone Encounter (Signed)
Left message for patient to call back       ----- Message from Loel Dubonnet, NP sent at 10/15/2021  7:49 AM EDT ----- Normal kidney function and electrolytes.  CBC shows stable mild anemia.  White blood cell count mildly elevated which is not of concern.  INR of 2.1 which is at goal.   No new or worsening anemia which is reassuring. Continue Plavix, Aspirin, Coumadin. May stop Aspirin 10/29/21. Recommend repeat CBC in 1-2 weeks for monitoring. Start Protonix as instructed in clinic.

## 2021-10-19 ENCOUNTER — Telehealth (HOSPITAL_BASED_OUTPATIENT_CLINIC_OR_DEPARTMENT_OTHER): Payer: Self-pay

## 2021-10-19 DIAGNOSIS — I5042 Chronic combined systolic (congestive) and diastolic (congestive) heart failure: Secondary | ICD-10-CM

## 2021-10-19 DIAGNOSIS — D6859 Other primary thrombophilia: Secondary | ICD-10-CM

## 2021-10-19 DIAGNOSIS — I480 Paroxysmal atrial fibrillation: Secondary | ICD-10-CM

## 2021-10-19 NOTE — Telephone Encounter (Addendum)
Seen by patient [name] on 10/16/2021  7:20 PM; labs mailed to patient.     ----- Message from Loel Dubonnet, NP sent at 10/15/2021  7:49 AM EDT ----- Normal kidney function and electrolytes.  CBC shows stable mild anemia.  White blood cell count mildly elevated which is not of concern.  INR of 2.1 which is at goal.  No new or worsening anemia which is reassuring. Continue Plavix, Aspirin, Coumadin. May stop Aspirin 10/29/21. Recommend repeat CBC in 1-2 weeks for monitoring. Start Protonix as instructed in clinic.

## 2021-10-30 DIAGNOSIS — D6859 Other primary thrombophilia: Secondary | ICD-10-CM | POA: Diagnosis not present

## 2021-10-30 DIAGNOSIS — E785 Hyperlipidemia, unspecified: Secondary | ICD-10-CM | POA: Diagnosis not present

## 2021-10-30 DIAGNOSIS — I5042 Chronic combined systolic (congestive) and diastolic (congestive) heart failure: Secondary | ICD-10-CM | POA: Diagnosis not present

## 2021-10-30 DIAGNOSIS — I2510 Atherosclerotic heart disease of native coronary artery without angina pectoris: Secondary | ICD-10-CM | POA: Diagnosis not present

## 2021-10-30 DIAGNOSIS — I480 Paroxysmal atrial fibrillation: Secondary | ICD-10-CM | POA: Diagnosis not present

## 2021-10-30 DIAGNOSIS — I10 Essential (primary) hypertension: Secondary | ICD-10-CM | POA: Diagnosis not present

## 2021-10-30 DIAGNOSIS — M3130 Wegener's granulomatosis without renal involvement: Secondary | ICD-10-CM | POA: Diagnosis not present

## 2021-10-30 LAB — CBC
Hematocrit: 27.4 % — ABNORMAL LOW (ref 37.5–51.0)
Hemoglobin: 8.9 g/dL — ABNORMAL LOW (ref 13.0–17.7)
MCH: 27 pg (ref 26.6–33.0)
MCHC: 32.5 g/dL (ref 31.5–35.7)
MCV: 83 fL (ref 79–97)
Platelets: 406 10*3/uL (ref 150–450)
RBC: 3.3 x10E6/uL — ABNORMAL LOW (ref 4.14–5.80)
RDW: 15.5 % — ABNORMAL HIGH (ref 11.6–15.4)
WBC: 9.8 10*3/uL (ref 3.4–10.8)

## 2021-11-02 ENCOUNTER — Encounter (HOSPITAL_BASED_OUTPATIENT_CLINIC_OR_DEPARTMENT_OTHER): Payer: Self-pay

## 2021-11-03 ENCOUNTER — Telehealth (HOSPITAL_BASED_OUTPATIENT_CLINIC_OR_DEPARTMENT_OTHER): Payer: Self-pay

## 2021-11-03 NOTE — Telephone Encounter (Addendum)
Seen by patient [name] on 11/02/2021  4:29 PM; follow up mychart message sent to patient.     ----- Message from Loel Dubonnet, NP sent at 11/02/2021  3:57 PM EDT ----- Hemoglobin decreased from previous. Continue Pantoprazole '40mg'$  daily. Please ensure he has discontinued aspirin. Should still be taking Plavix (Clopidogrel) and Warfarin for protection of stent and protection from stroke for atrial fibrillation. Recommend follow up with primary care as he may require referral to gastroenterology for further evaluation.

## 2021-11-03 NOTE — Telephone Encounter (Signed)
Please advise 

## 2021-11-04 NOTE — Telephone Encounter (Signed)
Please advise 

## 2021-11-04 NOTE — Telephone Encounter (Signed)
Addressed in separate phone encounter. See phone note 11/03/21 for reference.   Loel Dubonnet, NP

## 2021-11-17 DIAGNOSIS — I739 Peripheral vascular disease, unspecified: Secondary | ICD-10-CM | POA: Diagnosis not present

## 2021-11-17 DIAGNOSIS — D500 Iron deficiency anemia secondary to blood loss (chronic): Secondary | ICD-10-CM | POA: Diagnosis not present

## 2021-11-17 DIAGNOSIS — Z7952 Long term (current) use of systemic steroids: Secondary | ICD-10-CM | POA: Diagnosis not present

## 2021-11-17 DIAGNOSIS — M3130 Wegener's granulomatosis without renal involvement: Secondary | ICD-10-CM | POA: Diagnosis not present

## 2021-11-17 DIAGNOSIS — G4700 Insomnia, unspecified: Secondary | ICD-10-CM | POA: Diagnosis not present

## 2021-11-17 DIAGNOSIS — I4891 Unspecified atrial fibrillation: Secondary | ICD-10-CM | POA: Diagnosis not present

## 2021-11-17 DIAGNOSIS — E1169 Type 2 diabetes mellitus with other specified complication: Secondary | ICD-10-CM | POA: Diagnosis not present

## 2021-11-17 DIAGNOSIS — D6869 Other thrombophilia: Secondary | ICD-10-CM | POA: Diagnosis not present

## 2021-11-17 DIAGNOSIS — G4733 Obstructive sleep apnea (adult) (pediatric): Secondary | ICD-10-CM | POA: Diagnosis not present

## 2021-11-17 DIAGNOSIS — I5022 Chronic systolic (congestive) heart failure: Secondary | ICD-10-CM | POA: Diagnosis not present

## 2021-11-17 DIAGNOSIS — R351 Nocturia: Secondary | ICD-10-CM | POA: Diagnosis not present

## 2021-11-17 DIAGNOSIS — I25118 Atherosclerotic heart disease of native coronary artery with other forms of angina pectoris: Secondary | ICD-10-CM | POA: Diagnosis not present

## 2021-11-23 ENCOUNTER — Encounter (HOSPITAL_BASED_OUTPATIENT_CLINIC_OR_DEPARTMENT_OTHER): Payer: Self-pay

## 2021-11-24 ENCOUNTER — Telehealth: Payer: Self-pay | Admitting: Pharmacist Clinician (PhC)/ Clinical Pharmacy Specialist

## 2021-11-24 ENCOUNTER — Ambulatory Visit: Payer: Medicare Other | Attending: Internal Medicine

## 2021-11-24 DIAGNOSIS — Z7901 Long term (current) use of anticoagulants: Secondary | ICD-10-CM

## 2021-11-24 DIAGNOSIS — I480 Paroxysmal atrial fibrillation: Secondary | ICD-10-CM

## 2021-11-24 LAB — POCT INR: INR: 2.8 (ref 2.0–3.0)

## 2021-11-24 NOTE — Telephone Encounter (Signed)
Updated [place] for 12 months

## 2021-11-24 NOTE — Patient Instructions (Signed)
Continue 1 tablet Daily, EXCEPT 1/2 TABLET Monday and Friday. Recheck in 6 weeks. Please call us at [phone] with any questions.

## 2021-12-08 ENCOUNTER — Encounter (HOSPITAL_COMMUNITY): Payer: Self-pay

## 2021-12-08 ENCOUNTER — Telehealth (HOSPITAL_COMMUNITY): Payer: Self-pay

## 2021-12-08 NOTE — Telephone Encounter (Signed)
Attempted to call patient in regards to Cardiac Rehab - LM on VM Mailed letter 

## 2021-12-16 DIAGNOSIS — E669 Obesity, unspecified: Secondary | ICD-10-CM | POA: Diagnosis not present

## 2021-12-16 DIAGNOSIS — R768 Other specified abnormal immunological findings in serum: Secondary | ICD-10-CM | POA: Diagnosis not present

## 2021-12-16 DIAGNOSIS — M0579 Rheumatoid arthritis with rheumatoid factor of multiple sites without organ or systems involvement: Secondary | ICD-10-CM | POA: Diagnosis not present

## 2021-12-16 DIAGNOSIS — Z6835 Body mass index (BMI) 35.0-35.9, adult: Secondary | ICD-10-CM | POA: Diagnosis not present

## 2021-12-16 DIAGNOSIS — M3130 Wegener's granulomatosis without renal involvement: Secondary | ICD-10-CM | POA: Diagnosis not present

## 2021-12-16 DIAGNOSIS — M1991 Primary osteoarthritis, unspecified site: Secondary | ICD-10-CM | POA: Diagnosis not present

## 2021-12-16 DIAGNOSIS — Z7952 Long term (current) use of systemic steroids: Secondary | ICD-10-CM | POA: Diagnosis not present

## 2021-12-16 DIAGNOSIS — R5383 Other fatigue: Secondary | ICD-10-CM | POA: Diagnosis not present

## 2021-12-16 DIAGNOSIS — M6281 Muscle weakness (generalized): Secondary | ICD-10-CM | POA: Diagnosis not present

## 2021-12-21 ENCOUNTER — Other Ambulatory Visit (HOSPITAL_COMMUNITY): Payer: Self-pay

## 2021-12-21 ENCOUNTER — Other Ambulatory Visit: Payer: Self-pay | Admitting: Cardiovascular Disease

## 2021-12-21 NOTE — Telephone Encounter (Signed)
Rx request sent to pharmacy.  

## 2022-01-07 ENCOUNTER — Ambulatory Visit: Payer: Medicare Other

## 2022-01-12 ENCOUNTER — Ambulatory Visit: Payer: Medicare Other

## 2022-01-13 ENCOUNTER — Other Ambulatory Visit: Payer: Self-pay

## 2022-01-13 DIAGNOSIS — Z7901 Long term (current) use of anticoagulants: Secondary | ICD-10-CM

## 2022-01-13 DIAGNOSIS — I480 Paroxysmal atrial fibrillation: Secondary | ICD-10-CM

## 2022-01-13 MED ORDER — WARFARIN SODIUM 5 MG PO TABS: ORAL_TABLET | ORAL | 0 refills | Status: DC

## 2022-01-13 NOTE — Telephone Encounter (Signed)
Prescription refill request received for warfarin [place]: 10/14/21 Gary [name])  Next INR check: 01/07/22 Warfarin tablet strength: '5mg'$   Appropriate dose and refill sent to requested pharmacy.

## 2022-01-15 ENCOUNTER — Telehealth (HOSPITAL_BASED_OUTPATIENT_CLINIC_OR_DEPARTMENT_OTHER): Payer: Self-pay

## 2022-01-15 MED ORDER — AMLODIPINE BESYLATE 2.5 MG PO TABS: 2.5000 mg | ORAL_TABLET | Freq: Every day | ORAL | 1 refills | Status: DC

## 2022-01-15 NOTE — Telephone Encounter (Signed)
Received fax from [facility] requesting refills for Amlodipine 2.'5mg'$ . Rx request sent to pharmacy.

## 2022-01-18 ENCOUNTER — Ambulatory Visit: Payer: Medicare Other | Attending: Cardiology

## 2022-01-18 DIAGNOSIS — I480 Paroxysmal atrial fibrillation: Secondary | ICD-10-CM | POA: Diagnosis not present

## 2022-01-18 DIAGNOSIS — Z7901 Long term (current) use of anticoagulants: Secondary | ICD-10-CM | POA: Diagnosis not present

## 2022-01-18 LAB — POCT INR: INR: 3.5 — AB (ref 2.0–3.0)

## 2022-01-18 NOTE — Patient Instructions (Signed)
Description   Eat greens and only take 1/2 tablet tomorrow and then continue 1 tablet Daily, EXCEPT 1/2 TABLET Monday and Friday.  Recheck in 4 weeks.  Please call us at [phone] with any questions.

## 2022-02-01 NOTE — H&P (View-Only) (Signed)
Cardiology Office Note   Date:  02/03/22   ID:  ORVA CHRISTIE, DOB [birth year], MRN [id number]  PCP:  [name], DO  Cardiologist:     No chief complaint on file.  Patient ID: Gary [name] is a 77 y.o. male with hypertension,  Melanoma, CAD s/p PCI and CABG, mild ascending aortic aneurysm, chronic systolic and diastolic heart failure, hyperlipidemia, paroxysmal atrial fibrillation, and OSA non-compliant with CPAP who presents for follow up.  Gary [name] was first seen 11/2014 after being diagnosed with atrial fibrillation.  He felt tired and underwent successful DCCV on 12/2014.  He had an echo 11/2014 that revealed LVEF 45-50% with inferolateral hypokinesis.  He also was noted to have an ascending aorta aneurysm (4.0cm).  Repeat echo 03/2016 was unchanged. He then had a Lexiscan Myoview 11/27/14 that revealed a small, fixed inferolateral defect.  He was noted to have a carotid bruit so he was referred for carotid Doppler 02/2015 that revealed 1-39% ICA stenosis bilaterally.   He request to switch to warfarin due to cost. At his last appointment he reported lightheadedness. He was referred for ABI's that were normal 10/2019.   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 He also complained of LE stiffness, which he believed originated from his hip.   He was seen by Laurann Montana, NP on 10/14/21 for FU after LHC. He reported 1-1.5 week history of dark stool. His at-home BP readings averaged 110s-120s. EKG showed NSR 64 bpm with LVH and improvement in TWI in inferior leads compared to previous. He was started on Protonix '40mg'$  BID x 7 days then QD. Planned to stop Aspirin on 10/29/21 or sooner if Hb decreased. Hemoglobin on 10/14/21 was 11.9 K, and on 10/30/21 was 8.9 K. He was continued on Repatha 09/2021 EF 50-55%, gr2DD. Euvolemic and well compensated on exam. GDMT Losartan, Carvedilol. No indication for loop  diuretic at this time. With asymptomatic hypotension, workup of melena, as above. If SBP <100 that night, Amlodipine was held.    Today, he reports that he feels that his heart has been out of rhythm. He explains that he recently received a coronary stent procedure which left some damage. He had experienced internal bleeding for a while and his hemoglobin dropped. He reports that he had an influenza infection after Christmas. He checks his BP twice a week. His BP at home ranges 90s/57s to around 119/67. His HR is usually in the 90s regularly. I discussed his recent echocardiogram, which revealed 50-55% pumping function, no lasting damage from stents. He reports that during the end of December 2023,  he endorsed body aches, headaches, and eye ache then chest discomfort. He complains of occasional dizziness. He regularly takes zinc supplements. He had some SOB  in December, 2023 which he attributed to his infection. Otherwise, his breathing is okay. He denies any chest pain or peripheral edema. No headaches, syncope, orthopnea, or PND.  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    Past Surgical History:  Procedure Laterality Date   CARDIOVERSION N/A 12/06/2014   Procedure: CARDIOVERSION;  Surgeon: [name], MD;  Location: [facility] ENDOSCOPY;  Service: Cardiovascular;  Laterality: N/A;   CARDIOVERSION N/A 05/26/2017   Procedure: CARDIOVERSION;  Surgeon: [name], MD;  Location: [facility];  Service: Cardiovascular;   Laterality: N/A;   CARDIOVERSION N/A 04/25/2019   Procedure: CARDIOVERSION;  Surgeon: Sanda Klein, MD;  Location: [facility] [facility] ENDOSCOPY;  Service: Cardiovascular;  Laterality: N/A;   [place]     multiple stents   CORONARY ARTERY BYPASS GRAFT  2006   CORONARY STENT INTERVENTION N/A 09/28/2021   Procedure: CORONARY STENT INTERVENTION;  Surgeon: Lorretta Harp, MD;  Location: [facility] CV LAB;  Service: Cardiovascular;  Laterality: N/A;  SVG-PDA   EYE SURGERY     right eye-  muscular repair   LEFT HEART CATH AND CORS/GRAFTS ANGIOGRAPHY N/A 09/28/2021   Procedure: LEFT HEART CATH AND CORS/GRAFTS ANGIOGRAPHY;  Surgeon: Lorretta Harp, MD;  Location: [place] CV LAB;  Service: Cardiovascular;  Laterality: N/A;   TOTAL HIP ARTHROPLASTY  01/03/2012   Procedure: TOTAL HIP ARTHROPLASTY;  Surgeon: [name], MD;  Location: [facility] ORS;  Service: Orthopedics;  Laterality: Right;     Current Outpatient Medications  Medication Sig Dispense Refill   calcium carbonate (OS-CAL) 600 MG TABS tablet Take 600 mg by mouth daily with breakfast.     carvedilol (COREG) 25 MG tablet TAKE 1 TABLET BY MOUTH TWICE A DAY 180 tablet 3   cetirizine (ZYRTEC) 10 MG tablet Take 10 mg by mouth daily.     Cholecalciferol (VITAMIN D3) 125 MCG (5000 UT) TABS Take 5,000 Units by mouth daily.     clopidogrel (PLAVIX) 75 MG tablet Take 1 tablet (75 mg total) by mouth daily with breakfast. Take every day for 1 year 90 tablet 3   Coenzyme Q10 (CO Q 10) 100 MG CAPS Take 300 mg by mouth daily.      hydrochlorothiazide (HYDRODIURIL) 12.5 MG tablet TAKE 1 TABLET BY MOUTH EVERY DAY 90 tablet 1   levothyroxine (SYNTHROID, LEVOTHROID) 137 MCG tablet Take 137-205.5 mcg by mouth See admin instructions. Take 205.5 mcg by mouth on Wednesday.  Take 137 mcg by mouth daily on all other days     losartan (COZAAR) 100 MG tablet TAKE 1 TABLET BY MOUTH EVERY DAY 90 tablet 3   metFORMIN (GLUCOPHAGE) 500  MG tablet Take 500 mg by mouth 2 (two) times daily.  12   Multiple Vitamin (MULTIVITAMIN WITH MINERALS) TABS tablet Take 1 tablet by mouth daily. Centrum silver     nitroGLYCERIN (NITROSTAT) 0.4 MG SL tablet Place 1 tablet (0.4 mg total) under the tongue every 5 (five) minutes x 3 doses as needed for chest pain. 25 tablet 12   predniSONE (DELTASONE) 5 MG tablet Take 5 mg by mouth as needed (for wagoners).     REPATHA SURECLICK [id number] MG/ML SOAJ INJECT 140 MG INTO THE SKIN EVERY 14 (FOURTEEN) DAYS. INJECT 1 PEN INTO THE FATTY TISSUE SKIN EVERY 14 DAY 6 mL 3   riTUXimab (RITUXAN IV) Inject 1 application into the vein every 6 (six) months.      Semaglutide,0.25 or 0.'5MG'$ /DOS, (OZEMPIC, 0.25 OR 0.5 MG/DOSE,) 2 MG/3ML SOPN 0.'25mg'$  once weekly for 2 weeks then increase to 0.'5mg'$  weekly for 3 weeks Subcutaneous  testosterone cypionate (DEPOTESTOSTERONE CYPIONATE) 200 MG/ML injection Inject 100 mg into the muscle every 14 (fourteen) days.     venlafaxine XR (EFFEXOR-XR) 150 MG 24 hr capsule Take 150 mg by mouth daily.      warfarin (COUMADIN) 5 MG tablet TAKE 1/2 TABLET TO 1 TABLET BY MOUTH DAILY AS DIRECTED BY COUMADIN CLINIC 75 tablet 0   No current facility-administered medications for this visit.    Allergies:   Statins    Social History:  The patient  reports that he quit smoking about 38 years ago. His smoking use included cigarettes. He has never used smokeless tobacco. He reports that he does not drink alcohol and does not use drugs.   Family History:  The patient's family history includes Heart attack in his maternal grandfather, mother, paternal grandfather, and paternal grandmother; Other in his father; Stroke in his maternal grandmother.   ROS:  Please see the history of present illness.  (+) occasional dizziness (+)palpitations  PHYSICAL EXAM: VS:  BP 119/67 (BP Location: Left Arm, Patient Position: Sitting, Cuff Size: Large)   Pulse 73   Ht '5\' 10"'$  (1.778 m)   Wt 247 lb 14.4 oz (112.4  kg)   SpO2 95%   BMI 35.57 kg/m  , BMI Body mass index is 35.57 kg/m. GENERAL:  Well appearing HEENT: Pupils equal round and reactive, fundi not visualized, oral mucosa unremarkable NECK:  No jugular venous distention, waveform within normal limits, carotid upstroke brisk and symmetric, no bruits LUNGS:  Clear to auscultation bilaterally HEART:  irregular rate and rhythm.  PMI not displaced or sustained,S1 and S2 within normal limits, no S3, no S4, no clicks, no rubs, no murmurs ABD:  Flat, positive bowel sounds normal in frequency in pitch, no bruits, no rebound, no guarding, no midline pulsatile mass, no hepatomegaly, no splenomegaly EXT:  2 plus pulses throughout, no edema, no cyanosis no clubbing SKIN:  No rashes no nodules NEURO:  Cranial nerves II through XII grossly intact, motor grossly intact throughout PSYCH:  Cognitively intact, oriented to person place and time  EKG:  EKG is personally reviewed. 02/03/22: Atrial fibrillation.  Rate 56 bpm.  LVHrte 10/14/21; NSR 64 bpm with LVH and improvement in TWI in inferior leads compared to previous.  02/20/16: Sinus rhythm. Rate 66 bpm. Left ventricular hypertrophy with repolarization abnormality. 08/30/16:  Sinus rhythm. Rate 66 bpm. LAFB. Poor R wave progression. 04/12/17: Sinus rhythm.  Rate 65 bpm.  Prior anteroseptal infarct.  Nonspecific ST changes.   11/23/18: Atrial fibrillation.  Rate 77 bpm.  Prior septal infarct.   09/11/19: Sinus rhythm.  Rate 56 bpm.  Prior septal infarct 9/22: Sinus rhythm. 60 bpm. LVH  Left heart Cath 09/28/21 Conclusion      Prox LAD to Mid LAD lesion is 100% stenosed.   Ost LAD to Prox LAD lesion is 100% stenosed.   Ramus lesion is 100% stenosed.   Prox RCA lesion is 100% stenosed.   Prox RCA to Dist RCA lesion is 100% stenosed.   Ost Cx to Prox Cx lesion is 30% stenosed.   Dist Graft to Insertion lesion is 99% stenosed.   A drug-eluting stent was successfully placed using a SYNERGY XD [id number].   Post  intervention, there is a 0% residual stenosis.  ECHO 09/26/21 IMPRESSIONS     1. Left ventricular ejection fraction, by estimation, is 50 to 55%. The  left ventricle has low normal function. The left ventricle has no regional  wall motion abnormalities. There is  moderate left ventricular hypertrophy.  Left ventricular diastolic  parameters are consistent with Grade II diastolic dysfunction  (pseudonormalization).   2. Right ventricular systolic function is normal. The right ventricular  size is normal. There is normal pulmonary artery systolic pressure. The  estimated right ventricular systolic pressure is [id number] mmHg.   3. No evidence of mitral valve regurgitation. Moderate to severe mitral  annular calcification.   4. There is mild calcification of the aortic valve. Aortic valve  regurgitation is not visualized. No aortic stenosis is present.   5. The inferior vena cava is normal in size with greater than 50%  respiratory variability, suggesting right atrial pressure of 3 mmHg.   VAS US Carotid Duplex 09/19/20 Summary:  Right Carotid: Velocities in the right ICA are consistent with a 1-39%  stenosis.   Left Carotid: Velocities in the left ICA are consistent with a 1-39%  stenosis.   Vertebrals: Bilateral vertebral arteries demonstrate antegrade flow.  Subclavians: Normal flow hemodynamics were seen in bilateral subclavian               arteries.   ECHO 09/2020 IMPRESSIONS     1. Left ventricular ejection fraction, by estimation, is 50 to 55%. The  left ventricle has low normal function. The left ventricle demonstrates  regional wall motion abnormalities (see scoring diagram/findings for  description). There is mild asymmetric  left ventricular hypertrophy of the basal and septal segments. Left  ventricular diastolic parameters were normal.   2. Right ventricular systolic function is normal. The right ventricular  size is normal.   3. Left atrial size was moderately dilated.    4. The mitral valve is degenerative. Trivial mitral valve regurgitation.  No evidence of mitral stenosis. Severe mitral annular calcification.   5. The aortic valve is tricuspid. Aortic valve regurgitation is not  visualized. Mild to moderate aortic valve sclerosis/calcification is  present, without any evidence of aortic stenosis.   6. The inferior vena cava is normal in size with greater than 50%  respiratory variability, suggesting right atrial pressure of 3 mmHg.   Comparison(s): There was moderate concentric hypertrophy. Systolic  function was mildly to moderately reduced. The estimated ejection fraction  was in the range of 40% to 45%. Diffuse hypokinesis.    ECHO 9/19:  Study Conclusions   - Left ventricle: The cavity size was normal. There was moderate    concentric hypertrophy. Systolic function was mildly to    moderately reduced. The estimated ejection fraction was in the    range of 40% to 45%. Diffuse hypokinesis.  - Aortic valve: Transvalvular velocity was within the normal range.    There was no stenosis. There was no regurgitation.  - Mitral valve: Calcified annulus. Transvalvular velocity was    within the normal range. There was no evidence for stenosis.    There was no regurgitation.  - Right ventricle: The cavity size was normal. Wall thickness was    normal. Systolic function was mildly reduced.  - Atrial septum: No defect or patent foramen ovale was identified    by color flow Doppler.  - Tricuspid valve: There was no regurgitation.   ECHO 3/18:  - Left ventricle: The cavity size was normal. There was moderate    concentric hypertrophy. Systolic function was mildly reduced. The    estimated ejection fraction was in the range of 45% to 50%.    Diffuse hypokinesis. Doppler parameters are consistent with    abnormal left ventricular relaxation (grade 1 diastolic  dysfunction).  - Aortic valve: Trileaflet; mildly thickened, mildly calcified    leaflets.   - Aorta: Ascending aortic diameter: 41 mm (S).  - Ascending aorta: The ascending aorta was mildly dilated.  - Mitral valve: Calcified annulus. There was mild regurgitation.  - Left atrium: The atrium was moderately dilated.  - Pulmonary arteries: Systolic pressure was mildly increased. PA    peak pressure: 39 mm Hg (S).   Echo 11/20/14: Study Conclusions   - Left ventricle: The cavity size was normal. Wall thickness was   increased in a pattern of moderate LVH. Systolic function was   mildly reduced. The estimated ejection fraction was in the range   of 45% to 50%. Inferiorlateral hypokinesis. Abnormal GLPSS at   -11%, with inferolateral strain abnormality. The study is not   technically sufficient to allow evaluation of LV diastolic   function. - Aorta: Ascending aortic diameter: 40 mm (S). - Ascending aorta: The ascending aorta was mildly dilated. - Left atrium: The atrium was normal in size. - Right atrium: Severely dilated at 30 cm2. - Systemic veins: The IVC measures <2.1 cm, but does not collapse   >50%, suggesting an elevated RA pressure of 8 mmHg.  Lexiscan Myoview 11/27/14: There was no ST segment deviation noted during stress. No T wave inversion was noted during stress. Defect 1: There is a small defect of moderate severity.   Small size, moderate-intensity fixed inferolateral wall defect - could represent artifact or scar. The study was non-gated, therefore LVEF was not calculated and wall motion abnormalities could not be determined. Based on the lack of significant reversible ischemia, this is a low risk study.  Carotid Doppler 02/19/15: 1-39% ICA stenosis bilaterally  Recent Labs: 09/25/2021: ALT 20 10/14/2021: BUN 23; Creatinine, Ser 1.03; Potassium 4.2; Sodium 140 10/30/2021: Hemoglobin 8.9; Platelets 406    Lipid Panel    Component Value Date/Time   CHOL 155 05/26/2020 0807   TRIG 184 (H) 05/26/2020 0807   HDL 45 05/26/2020 0807   CHOLHDL 3.4 05/26/2020  0807   CHOLHDL 5.4 (H) 02/19/2015 0821   VLDL 22 02/19/2015 0821   LDLCALC 79 05/26/2020 0807     07/21/16: Total cholesterol 105, triglycerides 94, HDL 36, LDL 49  Wt Readings from Last 3 Encounters:  02/03/22 247 lb 14.4 oz (112.4 kg)  10/14/21 250 lb (113.4 kg)  09/28/21 243 lb 4.8 oz (110.4 kg)     ASSESSMENT AND PLAN: Essential hypertension Blood pressure has been well-controlled.  However it has often been low.  He also reports feeling more fatigued recently.  He has orthostatic symptoms.  We will discontinue amlodipine.  Continue carvedilol, HCTZ, and losartan.  Chronic combined systolic and diastolic heart failure (HCC) LVEF 50 to 55/min.  He has no heart failure symptoms.  Stopping amlodipine as above.  Continue blood pressure management and GDMT with carvedilol, HCTZ, losartan, and he is now also on Ozempic.  Consider Jardiance/Farxiga if needed.  Atrial fibrillation [facility]) Gary [name] is back in atrial fibrillation and asymptomatic.  It was likely triggered by his recent infection.  Plan for cardioversion.  We will check INR this week and then get him scheduled for a cardioversion.  Continue warfarin and carvedilol.  He has been feeling very fatigued.  We will also check CBC and ESR given his history of Wegener's granulomatosis and recent GI bleeding.  CAD (coronary artery disease) Status post CABG and multiple PCI's.  Continue amlodipine, carvedilol, clopidogrel, and Repatha.   The patient does not  have concerns regarding medicines.  The following changes have been made:  no change  Labs/ tests ordered today include:    Disposition:   F/U with Geraline Halberstadt C. Oval Linsey, MD in 2 months   I,Mitra Faeizi,acting as a Education administrator for [name], MD.,have documented all relevant documentation on the behalf of [name], MD,as directed by  [name], MD while in the presence of [name], MD.  I, [place] Oval Linsey, MD have reviewed all documentation for  this visit.  The documentation of the exam, diagnosis, procedures, and orders on 02/03/2022 are all accurate and complete.   Signed, [name], MD  02/03/2022 1:36 PM    [place]

## 2022-02-01 NOTE — Progress Notes (Signed)
Cardiology Office Note   Date:  02/01/2022   ID:  FONTAINE BEATO, DOB [birth year], MRN [id number]  PCP:  Charlane Ferretti, DO  Cardiologist:   Ambrose Finland   No chief complaint on file.    Patient ID: Gary [name] is a 77 y.o. male with hypertension,  Melanoma, CAD s/p PCI and CABG, mild ascending aortic aneurysm, chronic systolic and diastolic heart failure, hyperlipidemia, paroxysmal atrial fibrillation, and OSA non-compliant with CPAP who presents for follow up.  Mr. Gary [name] was first seen 11/2014 after being diagnosed with atrial fibrillation.  He felt tired and underwent successful DCCV on 12/2014.  He had an echo 11/2014 that revealed LVEF 45-50% with inferolateral hypokinesis.  He also was noted to have an ascending aorta aneurysm (4.0cm).  Repeat echo 03/2016 was unchanged. He then had a Lexiscan Myoview 11/27/14 that revealed a small, fixed inferolateral defect.  He was noted to have a carotid bruit so he was referred for carotid Doppler 02/2015 that revealed 1-39% ICA stenosis bilaterally.   He request to switch to warfarin due to cost. At his last appointment he reported lightheadedness. He was referred for ABI's that were normal 10/2019.   On 09/12/20,   Today, patient is doing well. 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  He has experiencing problems with his right eye and sinuses. He takes Prednisone 5 mg daily for his eye. He was seen by [facility] and he increased his dosages of his medication. For exercise, he currently has been in rehab and has had no issues. He denies shortness of breath.  He has no LE edema, orthopnea or PND.  He thinks that the lower extremity stiffness he was having was coming from his hip.  It seems to be improving with PT.  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    Past Surgical History:  Procedure Laterality Date   CARDIOVERSION N/A 12/06/2014   Procedure: CARDIOVERSION;  Surgeon: Chilton Si, MD;  Location: [facility] ENDOSCOPY;  Service: Cardiovascular;  Laterality: N/A;   CARDIOVERSION N/A 05/26/2017   Procedure: CARDIOVERSION;  Surgeon: Chilton Si, MD;  Location: [facility] ENDOSCOPY;  Service: Cardiovascular;  Laterality: N/A;   CARDIOVERSION N/A 04/25/2019   Procedure: CARDIOVERSION;  Surgeon: Thurmon Fair, MD;  Location: [facility] ENDOSCOPY;  Service: Cardiovascular;  Laterality: N/A;   CARPAL TUNNEL RELEASE     CORONARY ANGIOPLASTY     multiple stents   CORONARY ARTERY BYPASS GRAFT  2006   CORONARY STENT INTERVENTION N/A 09/28/2021   Procedure: CORONARY STENT INTERVENTION;  Surgeon: Runell Gess, MD;  Location: [facility] INVASIVE CV LAB;  Service: Cardiovascular;  Laterality: N/A;  SVG-PDA   EYE SURGERY     right eye-  muscular repair   LEFT HEART CATH AND CORS/GRAFTS ANGIOGRAPHY N/A 09/28/2021   Procedure: LEFT HEART CATH AND CORS/GRAFTS ANGIOGRAPHY;  Surgeon: Runell Gess, MD;  Location: [facility] INVASIVE CV LAB;  Service: Cardiovascular;  Laterality: N/A;   TOTAL HIP ARTHROPLASTY  01/03/2012   Procedure: TOTAL HIP ARTHROPLASTY;  Surgeon: Jacki Cones, MD;  Location: [facility] ORS;  Service: Orthopedics;  Laterality: Right;     Current Outpatient Medications  Medication Sig Dispense Refill   amLODipine (NORVASC) 2.5 MG tablet Take 1 tablet (2.5 mg total) by mouth daily. 90 tablet 1   calcium carbonate (OS-CAL) 600 MG TABS tablet Take 600 mg by mouth daily with breakfast.      carvedilol (COREG) 25 MG tablet TAKE 1 TABLET BY MOUTH TWICE A DAY 180 tablet 3   cetirizine (ZYRTEC) 10 MG tablet Take 10 mg by mouth daily.     Cholecalciferol (VITAMIN D3) 125 MCG (5000 UT) TABS Take 5,000 Units by mouth daily.     clopidogrel (PLAVIX) 75 MG tablet Take 1 tablet (75 mg total) by mouth daily with breakfast. Take every day for 1 year 90 tablet 3   Coenzyme Q10 (CO Q 10) 100 MG CAPS Take 300 mg by mouth daily.      hydrochlorothiazide (HYDRODIURIL) 12.5 MG tablet TAKE 1 TABLET BY MOUTH EVERY DAY 90 tablet 1   levothyroxine (SYNTHROID, LEVOTHROID) 137 MCG tablet Take 137-205.5 mcg by mouth See admin instructions. Take 205.5 mcg by mouth on Wednesday.  Take 137 mcg by mouth daily on all other days     losartan (COZAAR) 100 MG tablet TAKE 1 TABLET BY MOUTH EVERY DAY 90 tablet 3   metFORMIN (GLUCOPHAGE) 500 MG tablet Take 500 mg by mouth 2 (two) times daily.  12   Multiple Vitamin (MULTIVITAMIN WITH MINERALS) TABS tablet Take 1 tablet by mouth daily. Centrum silver     nitroGLYCERIN (NITROSTAT) 0.4 MG SL tablet Place 1 tablet (0.4 mg total) under the tongue every 5 (five) minutes x 3 doses as needed for chest pain. 25 tablet 12   pantoprazole (PROTONIX) 40 MG tablet Take 1 tablet (40 mg total) by mouth 2 (two) times daily for 7 days, THEN 1 tablet (40 mg total) daily. 30 tablet 11   predniSONE (DELTASONE) 5 MG tablet Take 5 mg by mouth as needed (for wagoners).     REPATHA SURECLICK 140 MG/ML SOAJ INJECT 140 MG INTO THE SKIN EVERY 14 (FOURTEEN) DAYS. INJECT 1 PEN INTO THE FATTY TISSUE SKIN EVERY 14 DAY 6 mL 3   riTUXimab (RITUXAN IV) Inject 1 application into the vein every 6 (six) months.      Semaglutide,0.25 or 0.5MG /DOS, (OZEMPIC, 0.25 OR 0.5 MG/DOSE,) 2 MG/3ML SOPN 0.25mg  once weekly for 2 weeks then increase to 0.5mg  weekly for 3 weeks Subcutaneous     testosterone cypionate (DEPOTESTOSTERONE CYPIONATE) 200 MG/ML injection Inject 100 mg into the muscle every 14 (fourteen) days.      venlafaxine XR (EFFEXOR-XR) 150 MG 24 hr capsule Take 150 mg by mouth daily.      warfarin (COUMADIN) 5 MG tablet TAKE 1/2 TABLET TO 1 TABLET BY MOUTH DAILY AS DIRECTED BY COUMADIN CLINIC 75 tablet 0   No current facility-administered medications for this visit.    Allergies:   Statins    Social History:  The patient  reports that he quit smoking about 38 years ago. His smoking use included cigarettes. He has never used smokeless tobacco. He reports that he does not drink alcohol and does not use drugs.   Family History:  The patient's family history includes Heart attack in his maternal grandfather, mother, paternal grandfather, and paternal  grandmother; Other in his father; Stroke in his maternal grandmother.    ROS:  Please see the history of present illness.  (+) sinuses (+) right eye  PHYSICAL EXAM: VS:  There were no vitals taken for this visit. , BMI There is no height or weight on file to calculate BMI. GENERAL:  Well appearing HEENT: Pupils equal round and reactive, fundi not visualized, oral mucosa unremarkable NECK:  No jugular venous distention, waveform within normal limits, carotid upstroke brisk and symmetric, no bruits LUNGS:  Clear to auscultation bilaterally HEART: Regular rate and rhythm.  PMI not displaced or sustained,S1 and S2 within normal limits, no S3, no S4, no clicks, no rubs, no murmurs ABD:  Flat, positive bowel sounds normal in frequency in pitch, no bruits, no rebound, no guarding, no midline pulsatile mass, no hepatomegaly, no splenomegaly EXT:  2 plus pulses throughout, no edema, no cyanosis no clubbing SKIN:  No rashes no nodules NEURO:  Cranial nerves II through XII grossly intact, motor grossly intact throughout PSYCH:  Cognitively intact, oriented to person place and time   EKG:   02/20/16: Sinus rhythm. Rate 66 bpm. Left ventricular hypertrophy with repolarization abnormality. 08/30/16:  Sinus rhythm. Rate 66 bpm. LAFB. Poor R wave  progression. 04/12/17: Sinus rhythm.  Rate 65 bpm.  Prior anteroseptal infarct.  Nonspecific ST changes.   11/23/18: Atrial fibrillation.  Rate 77 bpm.  Prior septal infarct.   09/11/19: Sinus rhythm.  Rate 56 bpm.  Prior septal infarct 9/22: Sinus rhythm. 60 bpm. LVH   ECHO 9/19:  Study Conclusions   - Left ventricle: The cavity size was normal. There was moderate    concentric hypertrophy. Systolic function was mildly to    moderately reduced. The estimated ejection fraction was in the    range of 40% to 45%. Diffuse hypokinesis.  - Aortic valve: Transvalvular velocity was within the normal range.    There was no stenosis. There was no regurgitation.  - Mitral valve: Calcified annulus. Transvalvular velocity was    within the normal range. There was no evidence for stenosis.    There was no regurgitation.  - Right ventricle: The cavity size was normal. Wall thickness was    normal. Systolic function was mildly reduced.  - Atrial septum: No defect or patent foramen ovale was identified    by color flow Doppler.  - Tricuspid valve: There was no regurgitation.   ECHO 3/18:  - Left ventricle: The cavity size was normal. There was moderate    concentric hypertrophy. Systolic function was mildly reduced. The    estimated ejection fraction was in the range of 45% to 50%.    Diffuse hypokinesis. Doppler parameters are consistent with    abnormal left ventricular relaxation (grade 1 diastolic    dysfunction).  - Aortic valve: Trileaflet; mildly thickened, mildly calcified    leaflets.  - Aorta: Ascending aortic diameter: 41 mm (S).  - Ascending aorta: The ascending aorta was mildly dilated.  - Mitral valve: Calcified annulus. There was mild regurgitation.  - Left atrium: The atrium was moderately dilated.  - Pulmonary arteries: Systolic pressure was mildly increased. PA    peak pressure: 39 mm Hg (S).   Echo 11/20/14: Study Conclusions   - Left ventricle: The cavity size was normal.  Wall thickness was   increased in a pattern of moderate LVH. Systolic function was   mildly reduced. The estimated ejection fraction was in the range   of 45% to 50%. Inferiorlateral hypokinesis.  Abnormal GLPSS at   -11%, with inferolateral strain abnormality. The study is not   technically sufficient to allow evaluation of LV diastolic   function. - Aorta: Ascending aortic diameter: 40 mm (S). - Ascending aorta: The ascending aorta was mildly dilated. - Left atrium: The atrium was normal in size. - Right atrium: Severely dilated at 30 cm2. - Systemic veins: The IVC measures <2.1 cm, but does not collapse   >50%, suggesting an elevated RA pressure of 8 mmHg.  Lexiscan Myoview 11/27/14: There was no ST segment deviation noted during stress. No T wave inversion was noted during stress. Defect 1: There is a small defect of moderate severity.   Small size, moderate-intensity fixed inferolateral wall defect - could represent artifact or scar. The study was non-gated, therefore LVEF was not calculated and wall motion abnormalities could not be determined. Based on the lack of significant reversible ischemia, this is a low risk study.  Carotid Doppler 02/19/15: 1-39% ICA stenosis bilaterally  Recent Labs: 09/25/2021: ALT 20 10/14/2021: BUN 23; Creatinine, Ser 1.03; Potassium 4.2; Sodium 140 10/30/2021: Hemoglobin 8.9; Platelets 406    Lipid Panel    Component Value Date/Time   CHOL 155 05/26/2020 0807   TRIG 184 (H) 05/26/2020 0807   HDL 45 05/26/2020 0807   CHOLHDL 3.4 05/26/2020 0807   CHOLHDL 5.4 (H) 02/19/2015 0821   VLDL 22 02/19/2015 0821   LDLCALC 79 05/26/2020 0807     07/21/16: Total cholesterol 105, triglycerides 94, HDL 36, LDL 49  Wt Readings from Last 3 Encounters:  10/14/21 250 lb (113.4 kg)  09/28/21 243 lb 4.8 oz (110.4 kg)  09/11/21 251 lb (113.9 kg)      ASSESSMENT AND PLAN: No problem-specific Assessment & Plan notes found for this encounter.     The  patient does not have concerns regarding medicines.  The following changes have been made:  no change  Labs/ tests ordered today include:    Disposition:   F/U with Cherene Dobbins C. Duke Salvia, MD in 1 year  I,Jada Bradford,acting as a scribe for Chilton Si, MD.,have documented all relevant documentation on the behalf of Chilton Si, MD,as directed by  Chilton Si, MD while in the presence of Chilton Si, MD.  I, Tahni Porchia C. Duke Salvia, MD have reviewed all documentation for this visit.  The documentation of the exam, diagnosis, procedures, and orders on 02/01/2022 are all accurate and complete.   Francene Castle  02/01/2022 9:17 AM    [facility]

## 2022-02-03 ENCOUNTER — Ambulatory Visit: Payer: Medicare Other | Attending: Cardiovascular Disease

## 2022-02-03 ENCOUNTER — Encounter (HOSPITAL_BASED_OUTPATIENT_CLINIC_OR_DEPARTMENT_OTHER): Payer: Self-pay | Admitting: Cardiovascular Disease

## 2022-02-03 ENCOUNTER — Ambulatory Visit (INDEPENDENT_AMBULATORY_CARE_PROVIDER_SITE_OTHER): Payer: Medicare Other | Admitting: Cardiovascular Disease

## 2022-02-03 ENCOUNTER — Encounter (HOSPITAL_BASED_OUTPATIENT_CLINIC_OR_DEPARTMENT_OTHER): Payer: Self-pay | Admitting: *Deleted

## 2022-02-03 VITALS — BP 119/67 | HR 73 | Ht 70.0 in | Wt 247.9 lb

## 2022-02-03 DIAGNOSIS — I480 Paroxysmal atrial fibrillation: Secondary | ICD-10-CM | POA: Insufficient documentation

## 2022-02-03 DIAGNOSIS — Z7901 Long term (current) use of anticoagulants: Secondary | ICD-10-CM | POA: Diagnosis not present

## 2022-02-03 DIAGNOSIS — I10 Essential (primary) hypertension: Secondary | ICD-10-CM | POA: Diagnosis not present

## 2022-02-03 DIAGNOSIS — I5042 Chronic combined systolic (congestive) and diastolic (congestive) heart failure: Secondary | ICD-10-CM | POA: Diagnosis not present

## 2022-02-03 DIAGNOSIS — Z5181 Encounter for therapeutic drug level monitoring: Secondary | ICD-10-CM | POA: Diagnosis not present

## 2022-02-03 DIAGNOSIS — I2583 Coronary atherosclerosis due to lipid rich plaque: Secondary | ICD-10-CM | POA: Diagnosis not present

## 2022-02-03 DIAGNOSIS — I4891 Unspecified atrial fibrillation: Secondary | ICD-10-CM | POA: Diagnosis not present

## 2022-02-03 DIAGNOSIS — I2510 Atherosclerotic heart disease of native coronary artery without angina pectoris: Secondary | ICD-10-CM | POA: Diagnosis not present

## 2022-02-03 DIAGNOSIS — R5383 Other fatigue: Secondary | ICD-10-CM

## 2022-02-03 LAB — POCT INR: INR: 4.3 — AB (ref 2.0–3.0)

## 2022-02-03 NOTE — Patient Instructions (Signed)
Description   HOLD today's dose and eat a serving of greens and then START 1 tablet daily, EXCEPT 1/2 TABLET Monday, Wednesday and Friday.  Recheck INR on Monday, 02/08/22 Please call us at [phone] with any questions.

## 2022-02-03 NOTE — Assessment & Plan Note (Signed)
Status post CABG and multiple PCI's.  Continue amlodipine, carvedilol, clopidogrel, and Repatha.

## 2022-02-03 NOTE — Assessment & Plan Note (Signed)
Blood pressure has been well-controlled.  However it has often been low.  He also reports feeling more fatigued recently.  He has orthostatic symptoms.  We will discontinue amlodipine.  Continue carvedilol, HCTZ, and losartan.

## 2022-02-03 NOTE — Assessment & Plan Note (Addendum)
Mr. Gary [name] is back in atrial fibrillation and asymptomatic.  It was likely triggered by his recent infection.  Plan for cardioversion.  We will check INR this week and then get him scheduled for a cardioversion.  Continue warfarin and carvedilol.  He has been feeling very fatigued.  We will also check CBC and ESR given his history of Wegener's granulomatosis and recent GI bleeding.

## 2022-02-03 NOTE — Assessment & Plan Note (Signed)
LVEF 50 to 55/min.  He has no heart failure symptoms.  Stopping amlodipine as above.  Continue blood pressure management and GDMT with carvedilol, HCTZ, losartan, and he is now also on Ozempic.  Consider Jardiance/Farxiga if needed.

## 2022-02-03 NOTE — Patient Instructions (Signed)
Medication Instructions:  STOP AMLODIPINE   SEE YOUR PRE CARDIOVERSION MEDICATION INSTRUCTIONS BELOW   Labwork: LABS TODAY   WEEKLY COUMADIN CHECKS UNTIL YOUR CARDIOVERSION  YOU WILL NEED TO HAVE A CBC/BMET/INR 3 DAYS PRIOR TO YOUR CARDIOVERSION 02/26/2022  Testing/Procedures: CARDIOVERSION 02/26/2022  Follow-Up: 1 MONTH WITH CAITLIN W NP   Any Other Special Instructions Will Be Listed Below (If Applicable).   You are scheduled for a Cardioversion on Friday, February 23 with Dr. Oval Linsey.  Please arrive at the [facility] (Main Entrance A) at [facility]: [address] [place], [place] [address] at 7:30 AM.   DIET:  Nothing to eat or drink after midnight except a sip of water with medications (see medication instructions below)  MEDICATION INSTRUCTIONS:       HOLD: OZEMPIC for 7 days PRIOR TO CARDIOVERSION        Continue taking your anticoagulant (blood thinner): Warfarin (Coumadin).  You will need to continue this after your procedure until you are told by your provider that it is safe to stop.    LABS: CBC/BMET/PT/INR/ESR TODAY   WILL CHECK YOUR COUMADIN LEVEL TODAY AND WEEKLY UNTIL YOUR CARDIOVERSION   FYI:  For your safety, and to allow [place] to monitor your vital signs accurately during the surgery/procedure we request: If you have artificial nails, gel coating, SNS etc, please have those removed prior to your surgery/procedure. Not having the nail coverings /polish removed may result in cancellation or delay of your surgery/procedure.  You must have a responsible person to drive you home and stay in the waiting area during your procedure. Failure to do so could result in cancellation.  Bring your insurance cards.  *Special Note: Every effort is made to have your procedure done on time. Occasionally there are emergencies that occur at the hospital that may cause delays. Please be patient if a delay does occur.

## 2022-02-04 LAB — BASIC METABOLIC PANEL
BUN/Creatinine Ratio: 18 (ref 10–24)
BUN: 21 mg/dL (ref 8–27)
CO2: 25 mmol/L (ref 20–29)
Calcium: 9.7 mg/dL (ref 8.6–10.2)
Chloride: 101 mmol/L (ref 96–106)
Creatinine, Ser: 1.2 mg/dL (ref 0.76–1.27)
Glucose: 112 mg/dL — ABNORMAL HIGH (ref 70–99)
Potassium: 4.1 mmol/L (ref 3.5–5.2)
Sodium: 143 mmol/L (ref 134–144)
eGFR: 62 mL/min/{1.73_m2} (ref >59)

## 2022-02-04 LAB — CBC WITH DIFFERENTIAL/PLATELET
Basophils Absolute: 0.1 10*3/uL (ref 0.0–0.2)
Basos: 1 %
EOS (ABSOLUTE): 0.4 10*3/uL (ref 0.0–0.4)
Eos: 4 %
Hematocrit: 40.7 % (ref 37.5–51.0)
Hemoglobin: 12.3 g/dL — ABNORMAL LOW (ref 13.0–17.7)
Immature Grans (Abs): 0 10*3/uL (ref 0.0–0.1)
Immature Granulocytes: 0 %
Lymphocytes Absolute: 1.5 10*3/uL (ref 0.7–3.1)
Lymphs: 15 %
MCH: 23.5 pg — ABNORMAL LOW (ref 26.6–33.0)
MCHC: 30.2 g/dL — ABNORMAL LOW (ref 31.5–35.7)
MCV: 78 fL — ABNORMAL LOW (ref 79–97)
Monocytes Absolute: 1.1 10*3/uL — ABNORMAL HIGH (ref 0.1–0.9)
Monocytes: 11 %
Neutrophils Absolute: 6.7 10*3/uL (ref 1.4–7.0)
Neutrophils: 69 %
Platelets: 471 10*3/uL — ABNORMAL HIGH (ref 150–450)
RBC: 5.24 x10E6/uL (ref 4.14–5.80)
RDW: 15.5 % — ABNORMAL HIGH (ref 11.6–15.4)
WBC: 9.9 10*3/uL (ref 3.4–10.8)

## 2022-02-04 LAB — PROTIME-INR
INR: 3.5 — ABNORMAL HIGH (ref 0.9–1.2)
Prothrombin Time: 34.6 s — ABNORMAL HIGH (ref 9.1–12.0)

## 2022-02-04 LAB — SEDIMENTATION RATE: Sed Rate: 33 mm/hr — ABNORMAL HIGH (ref 0–30)

## 2022-02-08 ENCOUNTER — Ambulatory Visit: Payer: Medicare Other | Attending: Cardiology | Admitting: *Deleted

## 2022-02-08 ENCOUNTER — Other Ambulatory Visit (HOSPITAL_COMMUNITY): Payer: Self-pay

## 2022-02-08 DIAGNOSIS — Z7901 Long term (current) use of anticoagulants: Secondary | ICD-10-CM | POA: Diagnosis not present

## 2022-02-08 DIAGNOSIS — I480 Paroxysmal atrial fibrillation: Secondary | ICD-10-CM | POA: Insufficient documentation

## 2022-02-08 LAB — POCT INR: INR: 3 (ref 2.0–3.0)

## 2022-02-08 NOTE — Patient Instructions (Addendum)
Description   Continue taking warfarin 1 tablet daily, except 1/2 tablet Monday, Wednesday and Friday. Recheck INR in 1 week pending Cardioversion. Please call us at [phone] with any questions.        

## 2022-02-15 ENCOUNTER — Ambulatory Visit: Payer: Medicare Other | Attending: Cardiovascular Disease

## 2022-02-15 DIAGNOSIS — Z7901 Long term (current) use of anticoagulants: Secondary | ICD-10-CM | POA: Diagnosis not present

## 2022-02-15 DIAGNOSIS — I480 Paroxysmal atrial fibrillation: Secondary | ICD-10-CM

## 2022-02-15 LAB — POCT INR: INR: 2.6 (ref 2.0–3.0)

## 2022-02-15 NOTE — Patient Instructions (Signed)
Description   Continue taking warfarin 1 tablet daily, except 1/2 tablet Monday, Wednesday and Friday. Recheck INR in 1 week pending Cardioversion. Please call us at [phone] with any questions.

## 2022-02-17 DIAGNOSIS — H2512 Age-related nuclear cataract, left eye: Secondary | ICD-10-CM | POA: Diagnosis not present

## 2022-02-17 DIAGNOSIS — H2521 Age-related cataract, morgagnian type, right eye: Secondary | ICD-10-CM | POA: Diagnosis not present

## 2022-02-17 DIAGNOSIS — E119 Type 2 diabetes mellitus without complications: Secondary | ICD-10-CM | POA: Diagnosis not present

## 2022-02-17 DIAGNOSIS — H40032 Anatomical narrow angle, left eye: Secondary | ICD-10-CM | POA: Diagnosis not present

## 2022-02-17 DIAGNOSIS — H524 Presbyopia: Secondary | ICD-10-CM | POA: Diagnosis not present

## 2022-02-25 ENCOUNTER — Telehealth (HOSPITAL_BASED_OUTPATIENT_CLINIC_OR_DEPARTMENT_OTHER): Payer: Self-pay | Admitting: *Deleted

## 2022-02-25 ENCOUNTER — Ambulatory Visit: Payer: Medicare Other | Attending: Cardiology | Admitting: *Deleted

## 2022-02-25 DIAGNOSIS — I10 Essential (primary) hypertension: Secondary | ICD-10-CM

## 2022-02-25 DIAGNOSIS — Z7901 Long term (current) use of anticoagulants: Secondary | ICD-10-CM

## 2022-02-25 DIAGNOSIS — M3130 Wegener's granulomatosis without renal involvement: Secondary | ICD-10-CM | POA: Diagnosis not present

## 2022-02-25 DIAGNOSIS — Z01812 Encounter for preprocedural laboratory examination: Secondary | ICD-10-CM | POA: Diagnosis not present

## 2022-02-25 DIAGNOSIS — I480 Paroxysmal atrial fibrillation: Secondary | ICD-10-CM | POA: Diagnosis not present

## 2022-02-25 LAB — CBC WITH DIFFERENTIAL/PLATELET
Basophils Absolute: 0 10*3/uL (ref 0.0–0.2)
Basos: 0 %
EOS (ABSOLUTE): 0 10*3/uL (ref 0.0–0.4)
Eos: 0 %
Hematocrit: 34.5 % — ABNORMAL LOW (ref 37.5–51.0)
Hemoglobin: 10.9 g/dL — ABNORMAL LOW (ref 13.0–17.7)
Lymphocytes Absolute: 0.5 10*3/uL — ABNORMAL LOW (ref 0.7–3.1)
Lymphs: 5 %
MCH: 23.6 pg — ABNORMAL LOW (ref 26.6–33.0)
MCHC: 31.6 g/dL (ref 31.5–35.7)
MCV: 75 fL — ABNORMAL LOW (ref 79–97)
Monocytes Absolute: 0.1 10*3/uL (ref 0.1–0.9)
Monocytes: 1 %
Neutrophils Absolute: 9 10*3/uL — ABNORMAL HIGH (ref 1.4–7.0)
Neutrophils: 94 %
Platelets: 434 10*3/uL (ref 150–450)
RBC: 4.61 x10E6/uL (ref 4.14–5.80)
RDW: 19.3 % — ABNORMAL HIGH (ref 11.6–15.4)
WBC: 9.5 10*3/uL (ref 3.4–10.8)

## 2022-02-25 LAB — BASIC METABOLIC PANEL
BUN/Creatinine Ratio: 18 (ref 10–24)
BUN: 19 mg/dL (ref 8–27)
CO2: 22 mmol/L (ref 20–29)
Calcium: 9.4 mg/dL (ref 8.6–10.2)
Chloride: 106 mmol/L (ref 96–106)
Creatinine, Ser: 1.03 mg/dL (ref 0.76–1.27)
Glucose: 231 mg/dL — ABNORMAL HIGH (ref 70–99)
Potassium: 4.4 mmol/L (ref 3.5–5.2)
Sodium: 141 mmol/L (ref 134–144)
eGFR: 74 mL/min/{1.73_m2} (ref >59)

## 2022-02-25 LAB — POCT INR: INR: 2.7 (ref 2.0–3.0)

## 2022-02-25 NOTE — Patient Instructions (Signed)
Description   Continue taking warfarin 1 tablet daily, except 1/2 tablet Monday, Wednesday and Friday. Recheck INR in 1 week post Cardioversion. Please call us at [phone] with any questions.

## 2022-02-25 NOTE — Telephone Encounter (Signed)
Patient went to [facility] for INR pre cardioversion which is scheduled for tomorrow. Patient needs labs prior, replaced current labs in [facility] for STAT labs  Will get today at [address] [facility]

## 2022-02-25 NOTE — Anesthesia Preprocedure Evaluation (Addendum)
Anesthesia Evaluation  Patient identified by MRN, date of birth, ID band Patient awake    Reviewed: Allergy & Precautions, NPO status , Patient's Chart, lab work & pertinent test results  History of Anesthesia Complications Negative for: history of anesthetic complications  Airway Mallampati: III  TM Distance: >3 FB Neck ROM: Full    Dental no notable dental hx. (+) Dental Advisory Given   Pulmonary sleep apnea and Continuous Positive Airway Pressure Ventilation , former smoker   Pulmonary exam normal        Cardiovascular hypertension, Pt. on medications and Pt. on home beta blockers + CAD, + Past MI, + Cardiac Stents, + Peripheral Vascular Disease and +CHF  + dysrhythmias Atrial Fibrillation  Rhythm:Irregular Rate:Tachycardia  2023 TTE IMPRESSIONS     1. Left ventricular ejection fraction, by estimation, is 50 to 55%. The  left ventricle has low normal function. The left ventricle has no regional  wall motion abnormalities. There is moderate left ventricular hypertrophy.  Left ventricular diastolic  parameters are consistent with Grade II diastolic dysfunction  (pseudonormalization).   2. Right ventricular systolic function is normal. The right ventricular  size is normal. There is normal pulmonary artery systolic pressure. The  estimated right ventricular systolic pressure is [id number] mmHg.   3. No evidence of mitral valve regurgitation. Moderate to severe mitral  annular calcification.   4. There is mild calcification of the aortic valve. Aortic valve  regurgitation is not visualized. No aortic stenosis is present.   5. The inferior vena cava is normal in size with greater than 50%  respiratory variability, suggesting right atrial pressure of 3 mmHg.   Comparison(s): No significant change from prior study.    Cath 2023   Prox LAD to Mid LAD lesion is 100% stenosed.   Ost LAD to Prox LAD lesion is 100% stenosed.    Ramus lesion is 100% stenosed.   Prox RCA lesion is 100% stenosed.   Prox RCA to Dist RCA lesion is 100% stenosed.   Ost Cx to Prox Cx lesion is 30% stenosed.   Dist Graft to Insertion lesion is 99% stenosed.   A drug-eluting stent was successfully placed using a SYNERGY XD [id number].   Post intervention, there is a 0% residual stenosis.    Neuro/Psych  PSYCHIATRIC DISORDERS  Depression    negative neurological ROS     GI/Hepatic negative GI ROS, Neg liver ROS,,,  Endo/Other  diabetes, Type 2, Oral Hypoglycemic AgentsHypothyroidism  Obesity   Renal/GU negative Renal ROS     Musculoskeletal  (+) Arthritis ,    Abdominal   Peds  Hematology  (+) Blood dyscrasia (Warfarin)   Anesthesia Other Findings Day of surgery medications reviewed with the patient.  Reproductive/Obstetrics                             Anesthesia Physical Anesthesia Plan  ASA: 3  Anesthesia Plan: General   Post-op Pain Management: Minimal or no pain anticipated   Induction: Intravenous, Rapid sequence and Cricoid pressure planned  PONV Risk Score and Plan: 2 and Treatment may vary due to age or medical condition and TIVA  Airway Management Planned: Oral ETT  Additional Equipment:   Intra-op Plan:   Post-operative Plan:   Informed Consent:   Plan Discussed with: Anesthesiologist  Anesthesia Plan Comments: (Pt did not hold GLP-1 will need RSI)        Anesthesia Quick Evaluation

## 2022-02-26 ENCOUNTER — Encounter (HOSPITAL_COMMUNITY)
Admission: RE | Disposition: A | Discharge status: Home / Self Care | Payer: Self-pay | Source: Home / Self Care | Attending: Cardiovascular Disease

## 2022-02-26 ENCOUNTER — Ambulatory Visit (HOSPITAL_BASED_OUTPATIENT_CLINIC_OR_DEPARTMENT_OTHER)
Admission: RE | Admit: 2022-02-26 | Discharge: 2022-02-26 | Disposition: A | Discharge status: Home / Self Care | Payer: Medicare Other | Source: Home / Self Care | Attending: Cardiovascular Disease | Admitting: Cardiovascular Disease

## 2022-02-26 ENCOUNTER — Encounter (HOSPITAL_COMMUNITY): Payer: Self-pay | Admitting: Cardiovascular Disease

## 2022-02-26 ENCOUNTER — Ambulatory Visit (HOSPITAL_COMMUNITY): Payer: Medicare Other | Admitting: Anesthesiology

## 2022-02-26 ENCOUNTER — Ambulatory Visit (HOSPITAL_BASED_OUTPATIENT_CLINIC_OR_DEPARTMENT_OTHER): Payer: Medicare Other | Admitting: Anesthesiology

## 2022-02-26 DIAGNOSIS — E1151 Type 2 diabetes mellitus with diabetic peripheral angiopathy without gangrene: Secondary | ICD-10-CM | POA: Diagnosis not present

## 2022-02-26 DIAGNOSIS — Z87891 Personal history of nicotine dependence: Secondary | ICD-10-CM | POA: Insufficient documentation

## 2022-02-26 DIAGNOSIS — Z7901 Long term (current) use of anticoagulants: Secondary | ICD-10-CM | POA: Diagnosis not present

## 2022-02-26 DIAGNOSIS — G4733 Obstructive sleep apnea (adult) (pediatric): Secondary | ICD-10-CM | POA: Insufficient documentation

## 2022-02-26 DIAGNOSIS — Z1152 Encounter for screening for COVID-19: Secondary | ICD-10-CM | POA: Diagnosis not present

## 2022-02-26 DIAGNOSIS — Z79899 Other long term (current) drug therapy: Secondary | ICD-10-CM | POA: Insufficient documentation

## 2022-02-26 DIAGNOSIS — I5043 Acute on chronic combined systolic (congestive) and diastolic (congestive) heart failure: Secondary | ICD-10-CM | POA: Diagnosis not present

## 2022-02-26 DIAGNOSIS — I5042 Chronic combined systolic (congestive) and diastolic (congestive) heart failure: Secondary | ICD-10-CM | POA: Insufficient documentation

## 2022-02-26 DIAGNOSIS — Z8249 Family history of ischemic heart disease and other diseases of the circulatory system: Secondary | ICD-10-CM | POA: Insufficient documentation

## 2022-02-26 DIAGNOSIS — D509 Iron deficiency anemia, unspecified: Secondary | ICD-10-CM | POA: Diagnosis present

## 2022-02-26 DIAGNOSIS — I252 Old myocardial infarction: Secondary | ICD-10-CM

## 2022-02-26 DIAGNOSIS — M3130 Wegener's granulomatosis without renal involvement: Secondary | ICD-10-CM | POA: Diagnosis not present

## 2022-02-26 DIAGNOSIS — G479 Sleep disorder, unspecified: Secondary | ICD-10-CM | POA: Diagnosis not present

## 2022-02-26 DIAGNOSIS — M1990 Unspecified osteoarthritis, unspecified site: Secondary | ICD-10-CM | POA: Insufficient documentation

## 2022-02-26 DIAGNOSIS — Z85820 Personal history of malignant melanoma of skin: Secondary | ICD-10-CM | POA: Insufficient documentation

## 2022-02-26 DIAGNOSIS — J90 Pleural effusion, not elsewhere classified: Secondary | ICD-10-CM | POA: Diagnosis not present

## 2022-02-26 DIAGNOSIS — J9601 Acute respiratory failure with hypoxia: Secondary | ICD-10-CM | POA: Diagnosis not present

## 2022-02-26 DIAGNOSIS — I480 Paroxysmal atrial fibrillation: Secondary | ICD-10-CM | POA: Diagnosis not present

## 2022-02-26 DIAGNOSIS — Z7984 Long term (current) use of oral hypoglycemic drugs: Secondary | ICD-10-CM | POA: Diagnosis not present

## 2022-02-26 DIAGNOSIS — I700 Atherosclerosis of aorta: Secondary | ICD-10-CM | POA: Diagnosis not present

## 2022-02-26 DIAGNOSIS — E876 Hypokalemia: Secondary | ICD-10-CM | POA: Diagnosis present

## 2022-02-26 DIAGNOSIS — I2583 Coronary atherosclerosis due to lipid rich plaque: Secondary | ICD-10-CM | POA: Diagnosis not present

## 2022-02-26 DIAGNOSIS — I2510 Atherosclerotic heart disease of native coronary artery without angina pectoris: Secondary | ICD-10-CM

## 2022-02-26 DIAGNOSIS — I5032 Chronic diastolic (congestive) heart failure: Secondary | ICD-10-CM | POA: Diagnosis not present

## 2022-02-26 DIAGNOSIS — F32A Depression, unspecified: Secondary | ICD-10-CM | POA: Diagnosis not present

## 2022-02-26 DIAGNOSIS — E785 Hyperlipidemia, unspecified: Secondary | ICD-10-CM | POA: Insufficient documentation

## 2022-02-26 DIAGNOSIS — I4891 Unspecified atrial fibrillation: Secondary | ICD-10-CM | POA: Diagnosis not present

## 2022-02-26 DIAGNOSIS — T410X5A Adverse effect of inhaled anesthetics, initial encounter: Secondary | ICD-10-CM | POA: Diagnosis present

## 2022-02-26 DIAGNOSIS — I110 Hypertensive heart disease with heart failure: Secondary | ICD-10-CM

## 2022-02-26 DIAGNOSIS — Z7985 Long-term (current) use of injectable non-insulin antidiabetic drugs: Secondary | ICD-10-CM | POA: Insufficient documentation

## 2022-02-26 DIAGNOSIS — I7121 Aneurysm of the ascending aorta, without rupture: Secondary | ICD-10-CM | POA: Insufficient documentation

## 2022-02-26 DIAGNOSIS — E669 Obesity, unspecified: Secondary | ICD-10-CM | POA: Diagnosis not present

## 2022-02-26 DIAGNOSIS — Z955 Presence of coronary angioplasty implant and graft: Secondary | ICD-10-CM | POA: Insufficient documentation

## 2022-02-26 DIAGNOSIS — D638 Anemia in other chronic diseases classified elsewhere: Secondary | ICD-10-CM | POA: Diagnosis not present

## 2022-02-26 DIAGNOSIS — I509 Heart failure, unspecified: Secondary | ICD-10-CM

## 2022-02-26 DIAGNOSIS — J954 Chemical pneumonitis due to anesthesia: Secondary | ICD-10-CM | POA: Diagnosis not present

## 2022-02-26 DIAGNOSIS — Z951 Presence of aortocoronary bypass graft: Secondary | ICD-10-CM | POA: Insufficient documentation

## 2022-02-26 DIAGNOSIS — E119 Type 2 diabetes mellitus without complications: Secondary | ICD-10-CM | POA: Diagnosis not present

## 2022-02-26 DIAGNOSIS — I4892 Unspecified atrial flutter: Secondary | ICD-10-CM

## 2022-02-26 DIAGNOSIS — E039 Hypothyroidism, unspecified: Secondary | ICD-10-CM | POA: Diagnosis not present

## 2022-02-26 DIAGNOSIS — E8770 Fluid overload, unspecified: Secondary | ICD-10-CM | POA: Diagnosis not present

## 2022-02-26 DIAGNOSIS — Z6835 Body mass index (BMI) 35.0-35.9, adult: Secondary | ICD-10-CM | POA: Insufficient documentation

## 2022-02-26 DIAGNOSIS — R0602 Shortness of breath: Secondary | ICD-10-CM | POA: Diagnosis not present

## 2022-02-26 DIAGNOSIS — I484 Atypical atrial flutter: Secondary | ICD-10-CM | POA: Diagnosis not present

## 2022-02-26 DIAGNOSIS — I6523 Occlusion and stenosis of bilateral carotid arteries: Secondary | ICD-10-CM | POA: Diagnosis present

## 2022-02-26 DIAGNOSIS — E1169 Type 2 diabetes mellitus with other specified complication: Secondary | ICD-10-CM | POA: Diagnosis not present

## 2022-02-26 DIAGNOSIS — J189 Pneumonia, unspecified organism: Secondary | ICD-10-CM | POA: Diagnosis not present

## 2022-02-26 DIAGNOSIS — Z6836 Body mass index (BMI) 36.0-36.9, adult: Secondary | ICD-10-CM | POA: Diagnosis not present

## 2022-02-26 HISTORY — PX: CARDIOVERSION: SHX1299

## 2022-02-26 LAB — POCT I-STAT, CHEM 8
BUN: 21 mg/dL (ref 8–23)
Calcium, Ion: 1.2 mmol/L (ref 1.15–1.40)
Chloride: 110 mmol/L (ref 98–111)
Creatinine, Ser: 1 mg/dL (ref 0.61–1.24)
Glucose, Bld: 111 mg/dL — ABNORMAL HIGH (ref 70–99)
HCT: 29 % — ABNORMAL LOW (ref 39.0–52.0)
Hemoglobin: 9.9 g/dL — ABNORMAL LOW (ref 13.0–17.0)
Potassium: 3.7 mmol/L (ref 3.5–5.1)
Sodium: 145 mmol/L (ref 135–145)
TCO2: 25 mmol/L (ref 22–32)

## 2022-02-26 LAB — GLUCOSE, CAPILLARY: Glucose-Capillary: 115 mg/dL — ABNORMAL HIGH (ref 70–99)

## 2022-02-26 SURGERY — CARDIOVERSION
Anesthesia: General

## 2022-02-26 MED ORDER — SUCCINYLCHOLINE CHLORIDE 200 MG/10ML IV SOSY
PREFILLED_SYRINGE | INTRAVENOUS | Status: DC | PRN
  Administered 2022-02-26: 140 mg via INTRAVENOUS

## 2022-02-26 MED ORDER — PROPOFOL 10 MG/ML IV BOLUS
INTRAVENOUS | Status: DC | PRN
  Administered 2022-02-26: 100 mg via INTRAVENOUS

## 2022-02-26 MED ORDER — LIDOCAINE HCL (CARDIAC) PF 100 MG/5ML IV SOSY
PREFILLED_SYRINGE | INTRAVENOUS | Status: DC | PRN
  Administered 2022-02-26: 100 mg via INTRAVENOUS

## 2022-02-26 MED ORDER — SODIUM CHLORIDE 0.9 % IV SOLN: INTRAVENOUS | Status: DC | PRN

## 2022-02-26 NOTE — CV Procedure (Signed)
Electrical Cardioversion Procedure Note Gary TRIMMER [id number] 1945-09-05  Procedure: Electrical Cardioversion Indications:  Atrial Flutter  Procedure Details Consent: Risks of procedure as well as the alternatives and risks of each were explained to the (patient/caregiver).  Consent for procedure obtained. Time Out: Verified patient identification, verified procedure, site/side was marked, verified correct patient position, special equipment/implants available, medications/allergies/relevent history reviewed, required imaging and test results available.  Performed  Patient placed on cardiac monitor, pulse oximetry, supplemental oxygen as necessary.  Sedation given:  propofol Pacer pads placed anterior and posterior chest.  Cardioverted 2 time(s).  Cardioverted at 150J unsuccessful.  200J successful.  Evaluation Findings: Post procedure EKG shows: NSR Complications: None Patient did tolerate procedure well.   [name], MD 02/26/2022, 9:06 AM

## 2022-02-26 NOTE — Interval H&P Note (Signed)
History and Physical Interval Note:  02/26/2022 8:23 AM  Gary [name]  has presented today for surgery, with the diagnosis of AFIB.  The various methods of treatment have been discussed with the patient and family. After consideration of risks, benefits and other options for treatment, the patient has consented to  Procedure(s): CARDIOVERSION (N/A) as a surgical intervention.  The patient's history has been reviewed, patient examined, no change in status, stable for surgery.  I have reviewed the patient's chart and labs.  Questions were answered to the patient's satisfaction.     [name], MD

## 2022-02-26 NOTE — Transfer of Care (Signed)
Immediate Anesthesia Transfer of Care Note  Patient: Gary [name]  Procedure(s) Performed: CARDIOVERSION  Patient Location: PACU  Anesthesia Type:General  Level of Consciousness: awake  Airway & Oxygen Therapy: Patient Spontanous Breathing and Patient connected to nasal cannula oxygen  Post-op Assessment: Report given to RN and Post -op Vital signs reviewed and stable  Post vital signs: Reviewed and stable  Last Vitals:  Vitals Value Taken Time  BP 140/73 02/26/22 0915  Temp    Pulse 72 02/26/22 0917  Resp 18 02/26/22 0917  SpO2 95 % 02/26/22 0917  Vitals shown include unvalidated device data.  Last Pain:  Vitals:   02/26/22 0915  TempSrc:   PainSc: 0-No pain         Complications: No notable events documented.

## 2022-02-26 NOTE — Anesthesia Postprocedure Evaluation (Signed)
Anesthesia Post Note  Patient: Gary [name]  Procedure(s) Performed: CARDIOVERSION     Patient location during evaluation: PACU Anesthesia Type: General Level of consciousness: sedated Pain management: pain level controlled Vital Signs Assessment: post-procedure vital signs reviewed and stable Respiratory status: spontaneous breathing and respiratory function stable Cardiovascular status: stable Postop Assessment: no apparent nausea or vomiting Anesthetic complications: no  No notable events documented.  Last Vitals:  Vitals:   02/26/22 0930 02/26/22 0945  BP: 105/71 131/73  Pulse: 77 77  Resp: 19 16  Temp:  36.7 C  SpO2: 95% 95%    Last Pain:  Vitals:   02/26/22 0945  TempSrc:   PainSc: 0-No pain                 [name] DANIEL

## 2022-02-26 NOTE — Anesthesia Procedure Notes (Signed)
Procedure Name: Intubation Date/Time: 02/26/2022 8:58 AM  Performed by: Lieutenant Diego, CRNAPre-anesthesia Checklist: Patient identified, Emergency Drugs available, Suction available and Patient being monitored Patient Re-evaluated:Patient Re-evaluated prior to induction Oxygen Delivery Method: Circle system utilized Preoxygenation: Pre-oxygenation with 100% oxygen Induction Type: IV induction Ventilation: Mask ventilation without difficulty Laryngoscope Size: Miller and 2 Grade View: Grade I Tube type: Oral Tube size: 7.5 mm Number of attempts: 1 Airway Equipment and Method: Stylet and Oral airway Placement Confirmation: ETT inserted through vocal cords under direct vision, positive ETCO2 and breath sounds checked- equal and bilateral Secured at: 23 cm Tube secured with: Tape Dental Injury: Teeth and Oropharynx as per pre-operative assessment

## 2022-03-01 ENCOUNTER — Emergency Department (HOSPITAL_BASED_OUTPATIENT_CLINIC_OR_DEPARTMENT_OTHER): Payer: Medicare Other

## 2022-03-01 ENCOUNTER — Other Ambulatory Visit: Payer: Self-pay

## 2022-03-01 ENCOUNTER — Telehealth: Payer: Self-pay | Admitting: Cardiovascular Disease

## 2022-03-01 ENCOUNTER — Inpatient Hospital Stay (HOSPITAL_BASED_OUTPATIENT_CLINIC_OR_DEPARTMENT_OTHER)
Admission: EM | Admit: 2022-03-01 | Discharge: 2022-03-06 | DRG: 189 | Disposition: A | Discharge status: Home / Self Care | Payer: Medicare Other | Attending: Internal Medicine | Admitting: Internal Medicine

## 2022-03-01 ENCOUNTER — Encounter (HOSPITAL_BASED_OUTPATIENT_CLINIC_OR_DEPARTMENT_OTHER): Payer: Self-pay | Admitting: Emergency Medicine

## 2022-03-01 DIAGNOSIS — I4891 Unspecified atrial fibrillation: Secondary | ICD-10-CM | POA: Diagnosis present

## 2022-03-01 DIAGNOSIS — I5043 Acute on chronic combined systolic (congestive) and diastolic (congestive) heart failure: Secondary | ICD-10-CM | POA: Diagnosis present

## 2022-03-01 DIAGNOSIS — M3130 Wegener's granulomatosis without renal involvement: Secondary | ICD-10-CM | POA: Diagnosis present

## 2022-03-01 DIAGNOSIS — T410X5A Adverse effect of inhaled anesthetics, initial encounter: Secondary | ICD-10-CM | POA: Diagnosis present

## 2022-03-01 DIAGNOSIS — I7121 Aneurysm of the ascending aorta, without rupture: Secondary | ICD-10-CM | POA: Diagnosis present

## 2022-03-01 DIAGNOSIS — J90 Pleural effusion, not elsewhere classified: Secondary | ICD-10-CM | POA: Diagnosis not present

## 2022-03-01 DIAGNOSIS — Z7902 Long term (current) use of antithrombotics/antiplatelets: Secondary | ICD-10-CM

## 2022-03-01 DIAGNOSIS — D509 Iron deficiency anemia, unspecified: Secondary | ICD-10-CM | POA: Diagnosis present

## 2022-03-01 DIAGNOSIS — E1151 Type 2 diabetes mellitus with diabetic peripheral angiopathy without gangrene: Secondary | ICD-10-CM | POA: Diagnosis present

## 2022-03-01 DIAGNOSIS — I5042 Chronic combined systolic (congestive) and diastolic (congestive) heart failure: Secondary | ICD-10-CM

## 2022-03-01 DIAGNOSIS — J9601 Acute respiratory failure with hypoxia: Secondary | ICD-10-CM | POA: Diagnosis present

## 2022-03-01 DIAGNOSIS — Z1152 Encounter for screening for COVID-19: Secondary | ICD-10-CM

## 2022-03-01 DIAGNOSIS — E1169 Type 2 diabetes mellitus with other specified complication: Secondary | ICD-10-CM | POA: Diagnosis present

## 2022-03-01 DIAGNOSIS — E039 Hypothyroidism, unspecified: Secondary | ICD-10-CM | POA: Diagnosis present

## 2022-03-01 DIAGNOSIS — Z91199 Patient's noncompliance with other medical treatment and regimen due to unspecified reason: Secondary | ICD-10-CM

## 2022-03-01 DIAGNOSIS — Z87891 Personal history of nicotine dependence: Secondary | ICD-10-CM

## 2022-03-01 DIAGNOSIS — I6523 Occlusion and stenosis of bilateral carotid arteries: Secondary | ICD-10-CM | POA: Diagnosis present

## 2022-03-01 DIAGNOSIS — Z7901 Long term (current) use of anticoagulants: Secondary | ICD-10-CM

## 2022-03-01 DIAGNOSIS — D638 Anemia in other chronic diseases classified elsewhere: Secondary | ICD-10-CM | POA: Diagnosis present

## 2022-03-01 DIAGNOSIS — Z8249 Family history of ischemic heart disease and other diseases of the circulatory system: Secondary | ICD-10-CM

## 2022-03-01 DIAGNOSIS — Z7952 Long term (current) use of systemic steroids: Secondary | ICD-10-CM

## 2022-03-01 DIAGNOSIS — F32A Depression, unspecified: Secondary | ICD-10-CM | POA: Diagnosis present

## 2022-03-01 DIAGNOSIS — I480 Paroxysmal atrial fibrillation: Secondary | ICD-10-CM | POA: Diagnosis present

## 2022-03-01 DIAGNOSIS — J189 Pneumonia, unspecified organism: Secondary | ICD-10-CM | POA: Diagnosis not present

## 2022-03-01 DIAGNOSIS — I252 Old myocardial infarction: Secondary | ICD-10-CM

## 2022-03-01 DIAGNOSIS — M1990 Unspecified osteoarthritis, unspecified site: Secondary | ICD-10-CM | POA: Diagnosis present

## 2022-03-01 DIAGNOSIS — G479 Sleep disorder, unspecified: Secondary | ICD-10-CM | POA: Diagnosis not present

## 2022-03-01 DIAGNOSIS — I5032 Chronic diastolic (congestive) heart failure: Secondary | ICD-10-CM | POA: Diagnosis not present

## 2022-03-01 DIAGNOSIS — E66811 Obesity, class 1: Secondary | ICD-10-CM

## 2022-03-01 DIAGNOSIS — Z85820 Personal history of malignant melanoma of skin: Secondary | ICD-10-CM

## 2022-03-01 DIAGNOSIS — E876 Hypokalemia: Secondary | ICD-10-CM | POA: Diagnosis present

## 2022-03-01 DIAGNOSIS — I484 Atypical atrial flutter: Secondary | ICD-10-CM | POA: Diagnosis present

## 2022-03-01 DIAGNOSIS — Z7985 Long-term (current) use of injectable non-insulin antidiabetic drugs: Secondary | ICD-10-CM

## 2022-03-01 DIAGNOSIS — E8770 Fluid overload, unspecified: Principal | ICD-10-CM

## 2022-03-01 DIAGNOSIS — Z6836 Body mass index (BMI) 36.0-36.9, adult: Secondary | ICD-10-CM

## 2022-03-01 DIAGNOSIS — I700 Atherosclerosis of aorta: Secondary | ICD-10-CM | POA: Diagnosis not present

## 2022-03-01 DIAGNOSIS — E119 Type 2 diabetes mellitus without complications: Secondary | ICD-10-CM | POA: Diagnosis present

## 2022-03-01 DIAGNOSIS — Z79890 Hormone replacement therapy (postmenopausal): Secondary | ICD-10-CM

## 2022-03-01 DIAGNOSIS — E785 Hyperlipidemia, unspecified: Secondary | ICD-10-CM | POA: Diagnosis present

## 2022-03-01 DIAGNOSIS — I509 Heart failure, unspecified: Secondary | ICD-10-CM | POA: Diagnosis not present

## 2022-03-01 DIAGNOSIS — J954 Chemical pneumonitis due to anesthesia: Secondary | ICD-10-CM | POA: Diagnosis present

## 2022-03-01 DIAGNOSIS — E669 Obesity, unspecified: Secondary | ICD-10-CM

## 2022-03-01 DIAGNOSIS — Z951 Presence of aortocoronary bypass graft: Secondary | ICD-10-CM

## 2022-03-01 DIAGNOSIS — R0602 Shortness of breath: Secondary | ICD-10-CM | POA: Diagnosis not present

## 2022-03-01 DIAGNOSIS — I110 Hypertensive heart disease with heart failure: Secondary | ICD-10-CM | POA: Diagnosis present

## 2022-03-01 DIAGNOSIS — Z955 Presence of coronary angioplasty implant and graft: Secondary | ICD-10-CM

## 2022-03-01 DIAGNOSIS — I2510 Atherosclerotic heart disease of native coronary artery without angina pectoris: Secondary | ICD-10-CM | POA: Diagnosis present

## 2022-03-01 DIAGNOSIS — I2583 Coronary atherosclerosis due to lipid rich plaque: Secondary | ICD-10-CM

## 2022-03-01 DIAGNOSIS — Z79899 Other long term (current) drug therapy: Secondary | ICD-10-CM

## 2022-03-01 DIAGNOSIS — G4733 Obstructive sleep apnea (adult) (pediatric): Secondary | ICD-10-CM

## 2022-03-01 DIAGNOSIS — Z96641 Presence of right artificial hip joint: Secondary | ICD-10-CM | POA: Diagnosis present

## 2022-03-01 DIAGNOSIS — Z888 Allergy status to other drugs, medicaments and biological substances status: Secondary | ICD-10-CM

## 2022-03-01 DIAGNOSIS — Z7984 Long term (current) use of oral hypoglycemic drugs: Secondary | ICD-10-CM

## 2022-03-01 LAB — CBC WITH DIFFERENTIAL/PLATELET
Abs Immature Granulocytes: 0.06 10*3/uL (ref 0.00–0.07)
Basophils Absolute: 0.1 10*3/uL (ref 0.0–0.1)
Basophils Relative: 1 %
Eosinophils Absolute: 0.3 10*3/uL (ref 0.0–0.5)
Eosinophils Relative: 3 %
HCT: 34.7 % — ABNORMAL LOW (ref 39.0–52.0)
Hemoglobin: 10.7 g/dL — ABNORMAL LOW (ref 13.0–17.0)
Immature Granulocytes: 0 %
Lymphocytes Relative: 7 %
Lymphs Abs: 1 10*3/uL (ref 0.7–4.0)
MCH: 23.1 pg — ABNORMAL LOW (ref 26.0–34.0)
MCHC: 30.8 g/dL (ref 30.0–36.0)
MCV: 74.9 fL — ABNORMAL LOW (ref 80.0–100.0)
Monocytes Absolute: 1.3 10*3/uL — ABNORMAL HIGH (ref 0.1–1.0)
Monocytes Relative: 10 %
Neutro Abs: 10.6 10*3/uL — ABNORMAL HIGH (ref 1.7–7.7)
Neutrophils Relative %: 79 %
Platelets: 366 10*3/uL (ref 150–400)
RBC: 4.63 MIL/uL (ref 4.22–5.81)
RDW: 18.5 % — ABNORMAL HIGH (ref 11.5–15.5)
WBC: 13.4 10*3/uL — ABNORMAL HIGH (ref 4.0–10.5)
nRBC: 0.1 % (ref 0.0–0.2)

## 2022-03-01 LAB — COMPREHENSIVE METABOLIC PANEL
ALT: 28 U/L (ref 0–44)
AST: 26 U/L (ref 15–41)
Albumin: 4.1 g/dL (ref 3.5–5.0)
Alkaline Phosphatase: 92 U/L (ref 38–126)
Anion gap: 12 (ref 5–15)
BUN: 21 mg/dL (ref 8–23)
CO2: 27 mmol/L (ref 22–32)
Calcium: 9.9 mg/dL (ref 8.9–10.3)
Chloride: 103 mmol/L (ref 98–111)
Creatinine, Ser: 1.24 mg/dL (ref 0.61–1.24)
GFR, Estimated: 60 mL/min — ABNORMAL LOW (ref >60)
Glucose, Bld: 106 mg/dL — ABNORMAL HIGH (ref 70–99)
Potassium: 3.8 mmol/L (ref 3.5–5.1)
Sodium: 142 mmol/L (ref 135–145)
Total Bilirubin: 0.6 mg/dL (ref 0.3–1.2)
Total Protein: 7.4 g/dL (ref 6.5–8.1)

## 2022-03-01 LAB — RESP PANEL BY RT-PCR (RSV, FLU A&B, COVID)  RVPGX2
Influenza A by PCR: NEGATIVE
Influenza B by PCR: NEGATIVE
Resp Syncytial Virus by PCR: NEGATIVE
SARS Coronavirus 2 by RT PCR: NEGATIVE

## 2022-03-01 LAB — CBG MONITORING, ED: Glucose-Capillary: 94 mg/dL (ref 70–99)

## 2022-03-01 LAB — BRAIN NATRIURETIC PEPTIDE: B Natriuretic Peptide: 471.9 pg/mL — ABNORMAL HIGH (ref 0.0–100.0)

## 2022-03-01 LAB — TROPONIN I (HIGH SENSITIVITY)
Troponin I (High Sensitivity): 23 ng/L — ABNORMAL HIGH (ref <18)
Troponin I (High Sensitivity): 23 ng/L — ABNORMAL HIGH (ref <18)

## 2022-03-01 LAB — PROTIME-INR
INR: 3.3 — ABNORMAL HIGH (ref 0.8–1.2)
Prothrombin Time: 33 seconds — ABNORMAL HIGH (ref 11.4–15.2)

## 2022-03-01 LAB — GLUCOSE, CAPILLARY: Glucose-Capillary: 110 mg/dL — ABNORMAL HIGH (ref 70–99)

## 2022-03-01 MED ORDER — ALPRAZOLAM 0.5 MG PO TABS
0.5000 mg | ORAL_TABLET | Freq: Every evening | ORAL | Status: DC | PRN
  Administered 2022-03-01 – 2022-03-05 (×5): 0.5 mg via ORAL
  Filled 2022-03-01 (×5): qty 1

## 2022-03-01 MED ORDER — ONDANSETRON HCL 4 MG/2ML IJ SOLN: 4.0000 mg | Freq: Four times a day (QID) | INTRAMUSCULAR | Status: DC | PRN

## 2022-03-01 MED ORDER — LOSARTAN POTASSIUM 50 MG PO TABS
100.0000 mg | ORAL_TABLET | Freq: Every day | ORAL | Status: DC
  Administered 2022-03-02: 100 mg via ORAL
  Filled 2022-03-01: qty 2

## 2022-03-01 MED ORDER — ACETAMINOPHEN 325 MG PO TABS: 650.0000 mg | ORAL_TABLET | Freq: Four times a day (QID) | ORAL | Status: DC | PRN

## 2022-03-01 MED ORDER — POLYETHYLENE GLYCOL 3350 17 G PO PACK: 17.0000 g | PACK | Freq: Every day | ORAL | Status: DC | PRN

## 2022-03-01 MED ORDER — SODIUM CHLORIDE 0.9 % IV SOLN
500.0000 mg | INTRAVENOUS | Status: DC
  Administered 2022-03-02 (×2): 500 mg via INTRAVENOUS
  Filled 2022-03-01 (×2): qty 5

## 2022-03-01 MED ORDER — INSULIN ASPART 100 UNIT/ML IJ SOLN
0.0000 [IU] | Freq: Three times a day (TID) | INTRAMUSCULAR | Status: DC
  Administered 2022-03-02: 2 [IU] via SUBCUTANEOUS
  Administered 2022-03-03: 3 [IU] via SUBCUTANEOUS

## 2022-03-01 MED ORDER — FUROSEMIDE 10 MG/ML IJ SOLN
40.0000 mg | Freq: Once | INTRAMUSCULAR | Status: AC
  Administered 2022-03-01: 40 mg via INTRAVENOUS
  Filled 2022-03-01: qty 4

## 2022-03-01 MED ORDER — CARVEDILOL 25 MG PO TABS
25.0000 mg | ORAL_TABLET | Freq: Two times a day (BID) | ORAL | Status: DC
  Administered 2022-03-01 – 2022-03-05 (×9): 25 mg via ORAL
  Filled 2022-03-01 (×10): qty 1

## 2022-03-01 MED ORDER — PREDNISONE 5 MG PO TABS
5.0000 mg | ORAL_TABLET | Freq: Every day | ORAL | Status: DC
  Administered 2022-03-02 – 2022-03-06 (×5): 5 mg via ORAL
  Filled 2022-03-01 (×5): qty 1

## 2022-03-01 MED ORDER — CLOPIDOGREL BISULFATE 75 MG PO TABS
75.0000 mg | ORAL_TABLET | Freq: Every day | ORAL | Status: DC
  Administered 2022-03-02 – 2022-03-06 (×5): 75 mg via ORAL
  Filled 2022-03-01 (×5): qty 1

## 2022-03-01 MED ORDER — PANTOPRAZOLE SODIUM 40 MG PO TBEC
40.0000 mg | DELAYED_RELEASE_TABLET | Freq: Every day | ORAL | Status: DC
  Administered 2022-03-01 – 2022-03-06 (×6): 40 mg via ORAL
  Filled 2022-03-01 (×6): qty 1

## 2022-03-01 MED ORDER — SODIUM CHLORIDE 0.9 % IV SOLN
2.0000 g | INTRAVENOUS | Status: DC
  Administered 2022-03-01 – 2022-03-04 (×4): 2 g via INTRAVENOUS
  Filled 2022-03-01 (×4): qty 20

## 2022-03-01 MED ORDER — ONDANSETRON HCL 4 MG PO TABS: 4.0000 mg | ORAL_TABLET | Freq: Four times a day (QID) | ORAL | Status: DC | PRN

## 2022-03-01 MED ORDER — VENLAFAXINE HCL ER 75 MG PO CP24
150.0000 mg | ORAL_CAPSULE | Freq: Every day | ORAL | Status: DC
  Administered 2022-03-02 – 2022-03-06 (×5): 150 mg via ORAL
  Filled 2022-03-01 (×5): qty 2

## 2022-03-01 MED ORDER — ACETAMINOPHEN 650 MG RE SUPP: 650.0000 mg | Freq: Four times a day (QID) | RECTAL | Status: DC | PRN

## 2022-03-01 MED ORDER — LEVOTHYROXINE SODIUM 25 MCG PO TABS
137.0000 ug | ORAL_TABLET | Freq: Every day | ORAL | Status: DC
  Administered 2022-03-02 – 2022-03-06 (×5): 137 ug via ORAL
  Filled 2022-03-01 (×5): qty 1

## 2022-03-01 MED ORDER — WARFARIN - PHARMACIST DOSING INPATIENT: Freq: Every day | Status: DC

## 2022-03-01 MED ORDER — HYDROCHLOROTHIAZIDE 12.5 MG PO TABS
12.5000 mg | ORAL_TABLET | Freq: Every day | ORAL | Status: DC
  Administered 2022-03-02: 12.5 mg via ORAL
  Filled 2022-03-01: qty 1

## 2022-03-01 NOTE — ED Provider Notes (Addendum)
[facility] Provider Note   CSN: [id number] Arrival date & time: 03/01/22  [id number]     History  Chief Complaint  Patient presents with   Shortness of Breath    Gary [name] is a 77 y.o. male.  Patient here with shortness of breath for the last few days.  He had cardioversion for his A-fib on Friday shortly later on that evening he started to develop some shortness of breath.  He had a dry cough.  Feels like he cannot walk without getting extremely short of breath.  He denies any chest pain.  Nothing makes it worse or better.  Former smoker.  With no history of COPD.  Significant history of CAD with stents.  Patient is on Coumadin.  History of Wegener's granulomatous.  Denies any major leg swelling.  The history is provided by the patient.       Home Medications Prior to Admission medications   Medication Sig Start Date End Date Taking? Authorizing Provider  ALPRAZolam Duanne Moron) 0.5 MG tablet Take 0.5 mg by mouth at bedtime.    [provider]  calcium carbonate (OS-CAL) 600 MG TABS tablet Take 600 mg by mouth daily with breakfast.    [provider]  carvedilol (COREG) 25 MG tablet TAKE 1 TABLET BY MOUTH TWICE A DAY 12/21/21   [name], MD  Cholecalciferol (VITAMIN D3) 125 MCG (5000 UT) TABS Take 10,000 Units by mouth daily.    [provider]  clopidogrel (PLAVIX) 75 MG tablet Take 1 tablet (75 mg total) by mouth daily with breakfast. Take every day for 1 year 09/30/21   Margie Billet, PA-C  Coenzyme Q10 (CO Q 10 PO) Take 400 mg by mouth daily.    [provider]  hydrochlorothiazide (HYDRODIURIL) 12.5 MG tablet TAKE 1 TABLET BY MOUTH EVERY DAY 12/21/21   [name], MD  levothyroxine (SYNTHROID, LEVOTHROID) 137 MCG tablet Take 137 mcg by mouth daily before breakfast. 10/27/14   [provider]  loratadine (CLARITIN) 10 MG tablet Take 10 mg by mouth daily.    [provider]  losartan (COZAAR) 100 MG tablet TAKE 1 TABLET BY MOUTH EVERY DAY 12/21/21   [name], MD  metFORMIN (GLUCOPHAGE) 500 MG tablet Take 500 mg by mouth 2 (two) times daily. 10/27/17   [provider]  Multiple Vitamin (MULTIVITAMIN WITH MINERALS) TABS tablet Take 1 tablet by mouth daily. Centrum silver men    [provider]  nitroGLYCERIN (NITROSTAT) 0.4 MG SL tablet Place 1 tablet (0.4 mg total) under the tongue every 5 (five) minutes x 3 doses as needed for chest pain. 09/29/21   Margie Billet, PA-C  pantoprazole (PROTONIX) 40 MG tablet Take 40 mg by mouth 2 (two) times daily.    [provider]  predniSONE (DELTASONE) 5 MG tablet Take 5 mg by mouth daily with breakfast.    [provider]  REPATHA SURECLICK [id number] MG/ML SOAJ INJECT 140 MG INTO THE SKIN EVERY 14 (FOURTEEN) DAYS. INJECT 1 PEN INTO THE FATTY TISSUE SKIN EVERY 14 DAY 04/27/21   [name], MD  riTUXimab (RITUXAN IV) Inject 1 application  into the vein See admin instructions. Take every two weeks and every six months alternating    [provider]  Semaglutide,0.25 or 0.'5MG'$ /DOS, (OZEMPIC, 0.25 OR 0.5 MG/DOSE,) 2 MG/3ML SOPN Inject 5 mg into the skin See admin instructions. Every two weeks 05/05/21   [provider]  sulfamethoxazole-trimethoprim (BACTRIM)  400-80 MG tablet Take 1 tablet by mouth daily.    [provider]  testosterone cypionate (DEPOTESTOTERONE CYPIONATE) 100 MG/ML injection Inject 100 mg into the muscle every 14 (fourteen) days. 10/09/14   [provider]  venlafaxine XR (EFFEXOR-XR) 150 MG 24 hr capsule Take 150 mg by mouth daily.     [provider]  warfarin (COUMADIN) 5 MG tablet TAKE 1/2 TABLET TO 1 TABLET BY MOUTH DAILY AS DIRECTED BY COUMADIN CLINIC Patient taking differently: Take 2.5-5 mg by mouth See admin instructions. Take 2.5 mg on Wednesday all the other days take 5 mg in the morning AS DIRECTED BY  COUMADIN CLINIC 01/13/22   [name], MD  Zinc Chelated 22.5 MG TABS Take 22.5 mg by mouth daily.    [provider]      Allergies    Statins    Review of Systems   Review of Systems  Physical Exam Updated Vital Signs BP (!) 141/63   Pulse 64   Temp 98 F (36.7 C) (Temporal)   Resp (!) 23   SpO2 99%  Physical Exam Vitals and nursing note reviewed.  Constitutional:      General: He is not in acute distress.    Appearance: He is well-developed. He is not ill-appearing.  HENT:     Head: Normocephalic and atraumatic.  Eyes:     Conjunctiva/sclera: Conjunctivae normal.  Cardiovascular:     Rate and Rhythm: Normal rate and regular rhythm.     Pulses: Normal pulses.     Heart sounds: Normal heart sounds. No murmur heard. Pulmonary:     Effort: Tachypnea present. No respiratory distress.     Breath sounds: Decreased breath sounds and rhonchi present.  Abdominal:     Palpations: Abdomen is soft.     Tenderness: There is no abdominal tenderness.  Musculoskeletal:        General: No swelling.     Cervical back: Neck supple.     Right lower leg: Edema present.     Left lower leg: Edema present.  Skin:    General: Skin is warm and dry.     Capillary Refill: Capillary refill takes less than 2 seconds.  Neurological:     Mental Status: He is alert.  Psychiatric:        Mood and Affect: Mood normal.     ED Results / Procedures / Treatments   Labs (all labs ordered are listed, but only abnormal results are displayed) Labs Reviewed  CBC WITH DIFFERENTIAL/PLATELET - Abnormal; Notable for the following components:      Result Value   WBC 13.4 (*)    Hemoglobin 10.7 (*)    HCT 34.7 (*)    MCV 74.9 (*)    MCH 23.1 (*)    RDW 18.5 (*)    Neutro Abs 10.6 (*)    Monocytes Absolute 1.3 (*)    All other components within normal limits  COMPREHENSIVE METABOLIC PANEL - Abnormal; Notable for the following components:   Glucose, Bld 106 (*)    GFR, Estimated 60  (*)    All other components within normal limits  BRAIN NATRIURETIC PEPTIDE - Abnormal; Notable for the following components:   B Natriuretic Peptide 471.9 (*)    All other components within normal limits  PROTIME-INR - Abnormal; Notable for the following components:   Prothrombin Time 33.0 (*)    INR 3.3 (*)    All other components within normal limits  TROPONIN I (HIGH  SENSITIVITY) - Abnormal; Notable for the following components:   Troponin I (High Sensitivity) 23 (*)    All other components within normal limits  RESP PANEL BY RT-PCR (RSV, FLU A&B, COVID)  RVPGX2  TROPONIN I (HIGH SENSITIVITY)    EKG EKG Interpretation  Date/Time:  Monday March 01 2022 09:20:58 EST Ventricular Rate:  64 PR Interval:  164 QRS Duration: 104 QT Interval:  406 QTC Calculation: 418 R Axis:   6 Text Interpretation: Sinus rhythm with Premature supraventricular complexes Minimal voltage criteria for LVH, may be normal variant ( Cornell product ) Septal infarct (cited on or before 28-Sep-2021) Abnormal ECG When compared with ECG of 26-Feb-2022 09:26, Premature supraventricular complexes are now Present Confirmed by Lennice Sites ([id number]) on 03/01/2022 9:25:54 AM  Radiology DG Chest Portable 1 View  Result Date: 03/01/2022 CLINICAL DATA:  Shortness of breath following cardioversion on February 23rd. EXAM: PORTABLE CHEST 1 VIEW COMPARISON:  Chest radiograph 09/25/2021 FINDINGS: The heart is enlarged, unchanged. Median sternotomy wires are unchanged. There is vascular congestion. There is worsened opacity throughout the left lower lobe. There is no focal airspace opacity on the right. There is no significant pleural effusion. There is no pneumothorax There is no acute osseous abnormality. IMPRESSION: Worsened patchy opacities throughout the left lower lobe since 09/25/2021. Findings could reflect infection or asymmetric edema. Electronically Signed   By: Valetta Mole M.D.   On: 03/01/2022 09:41     Procedures Procedures    Medications Ordered in ED Medications  furosemide (LASIX) injection 40 mg (40 mg Intravenous Given 03/01/22 1009)    ED Course/ Medical Decision Making/ A&P                             Medical Decision Making Amount and/or Complexity of Data Reviewed Labs: ordered. Radiology: ordered.  Risk Prescription drug management. Decision regarding hospitalization.   [name] is here with shortness of breath.  History of CAD, heart failure, Wegener's, hypertension, high cholesterol, atrial fibrillation on Coumadin.  He had cardioversion a couple days ago and started having shortness of breath shortly thereafter.  The last 3 days he has been short of breath with exertion and now even at rest.  He has a little bit of swelling on his legs.  He has diminished breath sounds with rhonchi.  Differential diagnosis likely volume overload from may be some heart failure, seems less likely to be CAD or PE or infectious process.  Will get CBC, BMP, troponin, BNP, COVID and flu test, chest x-ray.  EKG shows sinus rhythm.  No obvious ischemic changes.  Chest x-ray per my review and interpretation shows likely edema.  No focal opacity for pneumonia.  He does not have a fever.  But vital signs are significant for hypoxia in the mid 80s.  Placed on 2 L of oxygen which is new for him.  Otherwise vital signs are normal.  Per my review and interpretation of labs troponin is 23, BNP elevated to 471.  Chest x-ray previously concerning for volume overload.  Is my suspicion is that he is got some fluid retention.  COVID and flu testing are negative.  He has mild leukocytosis.  But he is on chronic steroids.  No significant anemia or electrolyte abnormality otherwise.  Will admit for some diuresis.  Do not think there is an infectious process at this time talk with medicine about the.  He is on chronic steroids and some chronic  suppressive antibiotics as well.  This chart was dictated  using voice recognition software.  Despite best efforts to proofread,  errors can occur which can change the documentation meaning.         Final Clinical Impression(s) / ED Diagnoses Final diagnoses:  Hypervolemia, unspecified hypervolemia type  Congestive heart failure, unspecified HF chronicity, unspecified heart failure type ([place])  Acute respiratory failure with hypoxia [facility])    Rx / DC Orders ED Discharge Orders     None         Lennice Sites, DO 03/01/22 1103    Yasira Engelson, DO 03/01/22 1109

## 2022-03-01 NOTE — Telephone Encounter (Signed)
Spoke with patient who had cardioversion on 2/23. On the way home he started with shortness of breath Has been having shortness of breath over the weekend with O2 SAT's dropping 70's--80's, when talks starts coughing  Discussed with [place] Mam NP who recommended ED  Advised patient, verbalized understanding

## 2022-03-01 NOTE — Progress Notes (Signed)
Pt refused to wear CPAP tonight. ?

## 2022-03-01 NOTE — Telephone Encounter (Signed)
Pt spouse states pt has been sick since his cardioversion. She states when he has his cpap his pulse ox is in the 90's, but during the day its been 73%. She states pt has been fatigue with a cough and loss of appetite.

## 2022-03-01 NOTE — H&P (Signed)
History and Physical    Patient: Gary [name] [id number] DOB: October 15, 1945 DOA: 03/01/2022 DOS: the patient was seen and examined on 03/01/2022 PCP: [name], DO  Patient coming from: Home   Chief Complaint:  Chief Complaint  Patient presents with   Shortness of Breath   HPI: Gary [name] is a 77 y.o. male with medical history significant of paroxysmal atrial fibrillation (Coumadin), CAD with stents, chronic systolic and diastolic heart failure, mild ascending aortic aneurysm, Wegener's granulomatosis, hyperlipidemia, OSA presented to the ED for worsening shortness of breath and admitted for acute hypoxic respiratory failure.   Has atrial fibrillation (Coumadin) and had a cardioversion on 02/26/2022 and since he has had shortness of breath with speaking and oxygen sats in the 70s.  Has dyspnea on exertion.  Notes that he has to stop when he is on his way to the bathroom which is about 25 to 50 feet.  He waits for his oxygen saturation and go back to the high 80s and then he was able to continue walking.  Has not been eating very well and notes that he has had more gas in his belly since the intubation.  Denies abdominal pain, chest pain, nausea, vomiting or diarrhea.  There is been no headache, rhinorrhea or nasal congestion.  Former smoker, quit at age 20.  ED course: Upon arrival, he was afebrile and not tachycardic however was tachypneic (24-27), and hypoxic (87%).  He was placed on 2 L of oxygen for oxygen saturations into the mid 80s.  He was given 40 mg of Lasix IV due to his lower extremity edema and history of heart failure.  Lab workup included leukocytosis 13.4 with left shift, glucose 106, BNP 472, INR 3.3, troponin 23 (x2).  No ischemic changes on EKG.  X-ray showed a left-sided patchy opacities.  CT chest with widespread but patchy pulmonary infiltrate more extensively on the left side consistent with bronchopneumonia.  No antibiotics were started prior to ED  departure.  Review of Systems: As mentioned in the history of present illness. All other systems reviewed and are negative. Past Medical History:  Diagnosis Date   Aortic aneurysm ([place]) 02/20/2016   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   Arthritis    Atrial fibrillation ([place]) 11/13/2014   Cancer ([place])    skin   Chronic combined systolic and diastolic heart failure ([place]) 02/20/2016   LVEF 45-50% 11/2014   Coronary artery disease    Depression    Essential hypertension 11/13/2014   Hyperlipidemia    Hyperlipidemia 11/13/2014   Hypertension    Sleep apnea    CPAP 'Uses half the time"  last study 2009   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   Past Surgical History:  Procedure Laterality Date   CARDIOVERSION N/A 12/06/2014   Procedure: CARDIOVERSION;  Surgeon: [name], MD;  Location: [facility];  Service: Cardiovascular;  Laterality: N/A;   CARDIOVERSION N/A 05/26/2017   Procedure: CARDIOVERSION;  Surgeon: [name], MD;  Location: [facility];  Service: Cardiovascular;  Laterality: N/A;   CARDIOVERSION N/A 04/25/2019   Procedure: CARDIOVERSION;  Surgeon: Sanda Klein, MD;  Location: [facility];  Service: Cardiovascular;  Laterality: N/A;   CARDIOVERSION N/A 02/26/2022   Procedure: CARDIOVERSION;  Surgeon: [name], MD;  Location: [facility];  Service: Cardiovascular;  Laterality: N/A;   [place]     multiple stents   CORONARY ARTERY BYPASS GRAFT  2006   CORONARY STENT INTERVENTION N/A 09/28/2021  Procedure: CORONARY STENT INTERVENTION;  Surgeon: Lorretta Harp, MD;  Location: [facility];  Service: Cardiovascular;  Laterality: N/A;  SVG-PDA   EYE SURGERY     right eye-  muscular repair   LEFT HEART CATH AND CORS/GRAFTS ANGIOGRAPHY N/A 09/28/2021   Procedure: LEFT HEART CATH AND CORS/GRAFTS ANGIOGRAPHY;  Surgeon: Lorretta Harp, MD;  Location: [facility];  Service: Cardiovascular;  Laterality: N/A;    TOTAL HIP ARTHROPLASTY  01/03/2012   Procedure: TOTAL HIP ARTHROPLASTY;  Surgeon: [name], MD;  Location: [facility];  Service: Orthopedics;  Laterality: Right;   Social History:  reports that he quit smoking about 38 years ago. His smoking use included cigarettes. He has never used smokeless tobacco. He reports that he does not drink alcohol and does not use drugs.  Allergies  Allergen Reactions   Statins Other (See Comments)    Muscle pain    Family History  Problem Relation Age of Onset   Heart attack Mother    Other Father        odd stomach disorder   Stroke Maternal Grandmother    Heart attack Maternal Grandfather    Heart attack Paternal Grandmother    Heart attack Paternal Grandfather     Prior to Admission medications   Medication Sig Start Date End Date Taking? Authorizing Provider  ALPRAZolam Duanne Moron) 0.5 MG tablet Take 0.5 mg by mouth at bedtime.    [provider]  calcium carbonate (OS-CAL) 600 MG TABS tablet Take 600 mg by mouth daily with breakfast.    [provider]  carvedilol (COREG) 25 MG tablet TAKE 1 TABLET BY MOUTH TWICE A DAY 12/21/21   [name], MD  Cholecalciferol (VITAMIN D3) 125 MCG (5000 UT) TABS Take 10,000 Units by mouth daily.    [provider]  clopidogrel (PLAVIX) 75 MG tablet Take 1 tablet (75 mg total) by mouth daily with breakfast. Take every day for 1 year 09/30/21   Margie Billet, PA-C  Coenzyme Q10 (CO Q 10 PO) Take 400 mg by mouth daily.    [provider]  hydrochlorothiazide (HYDRODIURIL) 12.5 MG tablet TAKE 1 TABLET BY MOUTH EVERY DAY 12/21/21   [name], MD  levothyroxine (SYNTHROID, LEVOTHROID) 137 MCG tablet Take 137 mcg by mouth daily before breakfast. 10/27/14   [provider]  loratadine (CLARITIN) 10 MG tablet Take 10 mg by mouth daily.    [provider]  losartan (COZAAR) 100 MG tablet TAKE 1 TABLET BY MOUTH EVERY DAY 12/21/21   [name],  MD  metFORMIN (GLUCOPHAGE) 500 MG tablet Take 500 mg by mouth 2 (two) times daily. 10/27/17   [provider]  Multiple Vitamin (MULTIVITAMIN WITH MINERALS) TABS tablet Take 1 tablet by mouth daily. Centrum silver men    [provider]  nitroGLYCERIN (NITROSTAT) 0.4 MG SL tablet Place 1 tablet (0.4 mg total) under the tongue every 5 (five) minutes x 3 doses as needed for chest pain. 09/29/21   Margie Billet, PA-C  pantoprazole (PROTONIX) 40 MG tablet Take 40 mg by mouth 2 (two) times daily.    [provider]  predniSONE (DELTASONE) 5 MG tablet Take 5 mg by mouth daily with breakfast.    [provider]  REPATHA SURECLICK [id number] MG/ML SOAJ INJECT 140 MG INTO THE SKIN EVERY 14 (FOURTEEN) DAYS. INJECT 1 PEN INTO THE FATTY TISSUE SKIN EVERY 14 DAY 04/27/21   [name], MD  riTUXimab (RITUXAN IV)  Inject 1 application  into the vein See admin instructions. Take every two weeks and every six months alternating    [provider]  Semaglutide,0.25 or 0.'5MG'$ /DOS, (OZEMPIC, 0.25 OR 0.5 MG/DOSE,) 2 MG/3ML SOPN Inject 5 mg into the skin See admin instructions. Every two weeks 05/05/21   [provider]  sulfamethoxazole-trimethoprim (BACTRIM) 400-80 MG tablet Take 1 tablet by mouth daily.    [provider]  testosterone cypionate (DEPOTESTOTERONE CYPIONATE) 100 MG/ML injection Inject 100 mg into the muscle every 14 (fourteen) days. 10/09/14   [provider]  venlafaxine XR (EFFEXOR-XR) 150 MG 24 hr capsule Take 150 mg by mouth daily.     [provider]  warfarin (COUMADIN) 5 MG tablet TAKE 1/2 TABLET TO 1 TABLET BY MOUTH DAILY AS DIRECTED BY COUMADIN CLINIC Patient taking differently: Take 2.5-5 mg by mouth See admin instructions. Take 2.5 mg on Wednesday all the other days take 5 mg in the morning AS DIRECTED BY COUMADIN CLINIC 01/13/22   [name], MD  Zinc Chelated 22.5 MG TABS Take 22.5 mg by mouth daily.     [provider]    Physical Exam: Vitals:   03/01/22 1723 03/01/22 1730 03/01/22 1851 03/01/22 1920  BP:  (!) 163/127 116/68 (!) 111/57  Pulse:  62 82   Resp:  (!) '22 20 18  '$ Temp: 99 F (37.2 C)  99.8 F (37.7 C) 98.9 F (37.2 C)  TempSrc: Oral  Oral Oral  SpO2:  94% 94% 94%  Weight:   107 kg   Height:   '5\' 10"'$  (1.778 m)    GEN:     alert, male pausing for breath within sentences  HENT:  mucus membranes moist, oropharyngeal without lesions or erythema,  nares patent, no nasal discharge  EYES:   pupils equal and reactive, EOM intact NECK:  supple, baseline ROM RESP: Wearing 2L nasal cannula, pausing for breath within sentences, coarse breath sounds left lower lung, adequate air movement bilaterally CVS:   regular rate and rhythm, intermittently in atrial fibrillation on the cardiac monitor while in the exam room, distal pulses intact   ABD:  soft, round; bowel sounds present; no palpable masses EXT:   normal ROM, atraumatic, no appreciable lower extremity edema  NEURO:  speech normal, alert and oriented   Skin:   warm and dry Psych: Normal affect, appropriate speech and behavior   Data Reviewed: CBC: WBC 13.4, hemoglobin 10.7, MCV 74.9 CMP: Glucose 106, GFR 60 BNP: 472 INR: 3.3 Troponin 23  Chest x-ray without pleural effusion, pneumothorax but there is left-lower abnormalities thought to be infection versus edema CT Chest: Widespread open patchy pulmonary infiltrates extensive in the left but also on the right consistent with bronchopneumonia, small left pleural effusion  Assessment and Plan: Principal Problem:   Acute respiratory failure with hypoxia (HCC) Active Problems:   CAD (coronary artery disease)   Hyperlipidemia   Atrial fibrillation (HCC)   Chronic combined systolic and diastolic heart failure (HCC)   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  COVID, influenza and RSV were all negative.  Troponins were flat.   He does have a mildly elevated BNP at 472.  Status post 40 mg IV Lasix.  Lower extremity edema has resolved.  CT chest showing widespread  patchy pulmonary infiltrates worse on the left than the right consistent with bronchopneumonia.  No antibiotics started in ED.  -Admit to cardiac telemetry -Start IV ceftriaxone and azithromycin for 5 days -Legionella and strep pneumo urinary antigens -MRSA PCR nasal swab  Systolic and diastolic heart failure -status- post 40 mg IV Lasix. -Strict I's and O's -Obtain new echocardiogram  Paroxysmal atrial fibrillation  Long-term use of anticoagulation INR 3.3.  Continue Coreg 25 mg twice daily, warfarin -Warfarin per pharmacy  Hypertension Continue hydrochlorothiazide 12.5 mg, losartan 100 mg.  Amlodipine was previously discontinued by cardiology.  Hypothyroidism Continue Synthroid 137 mcg  Non-insulin-dependent diabetes Glucose on admission 106. Hold home metformin and Ozempic -Sliding scale insulin -Follow up a1c -Trend glucose   CAD s/p PCI and CABG  Hyperlipidemia Repatha given last week.   Granulomatosis with polyangiitis -Prednisone 5 mg with breakfast,  - Hold home Rituximab, Bactrim.    Microcytic anemia Admission hemoglobin 10.7 with MCV of 74.9.  Has had some episodes with bleeding with some of his anticoagulation medications. -AM CBC - Trend hemoglobin   OSA On chart review, patient noted to be noncompliant with his CPAP -CPAP at bedtime  Depression Effexor 150 mcg  Difficulty sleeping Xanax 0.5 mg at bedtime       Advance Care Planning:   Code Status: Full Code   Consults: None  Family Communication: None  Severity of Illness: The appropriate patient status for this patient is INPATIENT. Inpatient status is  judged to be reasonable and necessary in order to provide the required intensity of service to ensure the patient's safety. The patient's presenting symptoms, physical exam findings, and initial radiographic and laboratory data in the context of their chronic comorbidities is felt to place them at high risk for further clinical deterioration. Furthermore, it is not anticipated that the patient will be medically stable for discharge from the hospital within 2 midnights of admission.   * I certify that at the point of admission it is my clinical judgment that the patient will require inpatient hospital care spanning beyond 2 midnights from the point of admission due to high intensity of service, high risk for further deterioration and high frequency of surveillance required.*  Author: Lyndee Hensen, DO 03/01/2022 9:11 PM  For on call review [email or web address].

## 2022-03-01 NOTE — Progress Notes (Signed)
[place] for Warfarin Indication: atrial fibrillation  Allergies  Allergen Reactions   Statins Other (See Comments)    Muscle pain    Patient Measurements: Height: '5\' 10"'$  (177.8 cm) Weight: 107 kg (235 lb 14.3 oz) IBW/kg (Calculated) : 73  Vital Signs: Temp: 98.9 F (37.2 C) (02/26 1920) Temp Source: Oral (02/26 1920) BP: 111/57 (02/26 1920) Pulse Rate: 82 (02/26 1851)  Labs: Recent Labs    03/01/22 0945 03/01/22 1115  HGB 10.7*  --   HCT 34.7*  --   PLT 366  --   LABPROT 33.0*  --   INR 3.3*  --   CREATININE 1.24  --   TROPONINIHS 23* 23*    Estimated Creatinine Clearance: 60.1 mL/min (by C-G formula based on SCr of 1.24 mg/dL).   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 Pharmacy consulted to manage warfarin. PTA warfarin regimen confirmed by [facility] clinic note 02/25/22 to be 2.'5mg'$  MWF and '5mg'$  TTSS. CBC stable, INR elevated 3.3.   Goal of Therapy:  INR 2-3 Monitor platelets by anticoagulation protocol: Yes   Plan:  Hold warfarin dose this evening Check INR daily while on warfarin Continue to monitor H&H and platelets  Thank you for allowing pharmacy to be a part of this patient's care.  Ardyth Harps, PharmD Clinical Pharmacist

## 2022-03-01 NOTE — ED Notes (Signed)
Called [facility] -- informed that the patient Bed Assignment is Ready

## 2022-03-01 NOTE — ED Triage Notes (Signed)
Pt arrives to ED with c/o SOB since cardioversion on 2/23. He notes SpO2 at home in 70s. They did intubate him on 2/23 for cardioversion due to him being on Ozempic and reducing the chances of aspiration.

## 2022-03-02 ENCOUNTER — Inpatient Hospital Stay (HOSPITAL_COMMUNITY): Payer: Medicare Other

## 2022-03-02 DIAGNOSIS — J189 Pneumonia, unspecified organism: Secondary | ICD-10-CM | POA: Diagnosis not present

## 2022-03-02 DIAGNOSIS — E669 Obesity, unspecified: Secondary | ICD-10-CM

## 2022-03-02 DIAGNOSIS — I4891 Unspecified atrial fibrillation: Secondary | ICD-10-CM | POA: Diagnosis not present

## 2022-03-02 DIAGNOSIS — I5032 Chronic diastolic (congestive) heart failure: Secondary | ICD-10-CM

## 2022-03-02 DIAGNOSIS — E876 Hypokalemia: Secondary | ICD-10-CM

## 2022-03-02 DIAGNOSIS — I480 Paroxysmal atrial fibrillation: Secondary | ICD-10-CM | POA: Diagnosis not present

## 2022-03-02 DIAGNOSIS — I2510 Atherosclerotic heart disease of native coronary artery without angina pectoris: Secondary | ICD-10-CM | POA: Diagnosis not present

## 2022-03-02 LAB — COMPREHENSIVE METABOLIC PANEL
ALT: 30 U/L (ref 0–44)
AST: 29 U/L (ref 15–41)
Albumin: 2.7 g/dL — ABNORMAL LOW (ref 3.5–5.0)
Alkaline Phosphatase: 93 U/L (ref 38–126)
Anion gap: 13 (ref 5–15)
BUN: 19 mg/dL (ref 8–23)
CO2: 26 mmol/L (ref 22–32)
Calcium: 8.8 mg/dL — ABNORMAL LOW (ref 8.9–10.3)
Chloride: 100 mmol/L (ref 98–111)
Creatinine, Ser: 1.11 mg/dL (ref 0.61–1.24)
GFR, Estimated: >60 mL/min (ref >60)
Glucose, Bld: 106 mg/dL — ABNORMAL HIGH (ref 70–99)
Potassium: 3.3 mmol/L — ABNORMAL LOW (ref 3.5–5.1)
Sodium: 139 mmol/L (ref 135–145)
Total Bilirubin: 0.8 mg/dL (ref 0.3–1.2)
Total Protein: 6.2 g/dL — ABNORMAL LOW (ref 6.5–8.1)

## 2022-03-02 LAB — ECHOCARDIOGRAM COMPLETE
AR max vel: 1.99 cm2
AV Area VTI: 1.84 cm2
AV Area mean vel: 1.77 cm2
AV Mean grad: 7.3 mmHg
AV Peak grad: 12.7 mmHg
Ao pk vel: 1.78 m/s
Area-P 1/2: 4.47 cm2
Calc EF: 44.7 %
Est EF: 50
Height: 70 in
S' Lateral: 4.5 cm
Single Plane A2C EF: 40.1 %
Single Plane A4C EF: 46.4 %
Weight: 3746.06 oz

## 2022-03-02 LAB — CBC
HCT: 30.7 % — ABNORMAL LOW (ref 39.0–52.0)
Hemoglobin: 9.7 g/dL — ABNORMAL LOW (ref 13.0–17.0)
MCH: 23.5 pg — ABNORMAL LOW (ref 26.0–34.0)
MCHC: 31.6 g/dL (ref 30.0–36.0)
MCV: 74.3 fL — ABNORMAL LOW (ref 80.0–100.0)
Platelets: 365 10*3/uL (ref 150–400)
RBC: 4.13 MIL/uL — ABNORMAL LOW (ref 4.22–5.81)
RDW: 18.3 % — ABNORMAL HIGH (ref 11.5–15.5)
WBC: 11.5 10*3/uL — ABNORMAL HIGH (ref 4.0–10.5)
nRBC: 0 % (ref 0.0–0.2)

## 2022-03-02 LAB — MRSA NEXT GEN BY PCR, NASAL: MRSA by PCR Next Gen: NOT DETECTED

## 2022-03-02 LAB — GLUCOSE, CAPILLARY
Glucose-Capillary: 98 mg/dL (ref 70–99)
Glucose-Capillary: 115 mg/dL — ABNORMAL HIGH (ref 70–99)
Glucose-Capillary: 139 mg/dL — ABNORMAL HIGH (ref 70–99)
Glucose-Capillary: 111 mg/dL — ABNORMAL HIGH (ref 70–99)

## 2022-03-02 LAB — PROTIME-INR
INR: 2.8 — ABNORMAL HIGH (ref 0.8–1.2)
Prothrombin Time: 29.3 seconds — ABNORMAL HIGH (ref 11.4–15.2)

## 2022-03-02 LAB — STREP PNEUMONIAE URINARY ANTIGEN: Strep Pneumo Urinary Antigen: NEGATIVE

## 2022-03-02 MED ORDER — WARFARIN SODIUM 5 MG PO TABS
5.0000 mg | ORAL_TABLET | Freq: Once | ORAL | Status: AC
  Administered 2022-03-03: 5 mg via ORAL
  Filled 2022-03-02: qty 1

## 2022-03-02 MED ORDER — EMPAGLIFLOZIN 10 MG PO TABS
10.0000 mg | ORAL_TABLET | Freq: Every day | ORAL | Status: DC
  Administered 2022-03-02 – 2022-03-06 (×5): 10 mg via ORAL
  Filled 2022-03-02 (×5): qty 1

## 2022-03-02 MED ORDER — POTASSIUM CHLORIDE CRYS ER 20 MEQ PO TBCR
40.0000 meq | EXTENDED_RELEASE_TABLET | ORAL | Status: AC
  Administered 2022-03-02 (×2): 40 meq via ORAL
  Filled 2022-03-02 (×2): qty 2

## 2022-03-02 MED ORDER — SPIRONOLACTONE 12.5 MG HALF TABLET
12.5000 mg | ORAL_TABLET | Freq: Every day | ORAL | Status: DC
  Administered 2022-03-02 – 2022-03-06 (×5): 12.5 mg via ORAL
  Filled 2022-03-02 (×5): qty 1

## 2022-03-02 NOTE — Assessment & Plan Note (Signed)
Echocardiogram with preserved LV systolic function with EF 50%, global hypokinesis, moderate LVH, RV systolic function with mild reduction. RVSP 35,5 mmHg. LA with severe dilatation.  Blood pressure systolic is [id number] to [id number] mmHg.   Clinically patient is euvolemic.   Plan to continue medical therapy with carvedilol, and losartan.  Plan to add spironolactone and SGLT 2 inh.

## 2022-03-02 NOTE — Progress Notes (Signed)
MD informed of 12 beat run of V/Tach

## 2022-03-02 NOTE — Progress Notes (Signed)
PROGRESS NOTE    Gary [name]  [id number] DOB: 02/19/1945 DOA: 03/01/2022 PCP: [name], DO  77/M w/ PAfib, CAD chronic diastolic CHF, Wegner's granulomatosis presented with dyspnea.  Patient had DCCV on 02/26/22. Reports dyspnea on exertion after procedure, along with 02 desaturation down to high 80's. Labs creat 1,24, BNP 471, troponin 23 and 23  CXR w/cardiomegaly with bilateral interstitial markings CT chest with bilateral asymmetric ground glass opacities, more predominant at bases.  -Patient was placed on antibiotic therapy for pneumonia.   Subjective: Feels weak and tired, cough a little better  Assessment and Plan:  Aspiration pneumonia Acute hypoxemic respiratory failure.  CXR w/ bilateral pulmonary infiltrates.  -continue antibiotic therapy with IV ceftriaxone and oral azithromycin.  -wean O2 -Add incentive spirometry, out of bed to chair -Suspect aspiration in the background of recent DCCV, no history of dysphagia  Acute on chronic diastolic CHF (congestive heart failure) ([place]) -Echo with preserved LV systolic function with EF 50%, global hypokinesis, moderate LVH, RV systolic function with mild reduction -IV Lasix x 1 today, continue Jardiance and Aldactone, carvedilol, decrease losartan dose -BMP in a.m.  CAD (coronary artery disease) -Stable, no chest pain  P.Atrial fibrillation (HCC) Continue  carvedilol and warfarin.  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  Continue home dose of prednisone.   Anemia of chronic disease, cell count has been stable.   DVT prophylaxis: Warfarin Code Status: Full Code Family Communication: None present Disposition Plan: Home likely 1 to 2 days  Consultants:    Procedures:    Antimicrobials:    Objective: Vitals:   03/02/22 1925 03/02/22 2146 03/02/22 2328 03/03/22 0515  BP: 122/63  123/63 124/74  Pulse:   68 66  Resp: '18  16 18  '$ Temp: 97.8 F (36.6 C)  98.4 F (36.9 C) 98.3 F (36.8 C)  TempSrc: Oral  Oral Oral  SpO2: 98% 99% 95% 90%  Weight:    105.8 kg  Height:        Intake/Output Summary (Last 24 hours) at 03/03/2022 1132 Last data filed at 03/03/2022 [id number] Gross per 24 hour  Intake 730 ml  Output 400 ml  Net 330 ml   Filed Weights   03/01/22 1851 03/02/22 0515 03/03/22 0515  Weight: 107 kg 106.2 kg 105.8 kg    Examination: Gen: Awake, Alert, Oriented X 3,  HEENT: + JVD Lungs: Bilateral Rales CVS: S1S2/RRR Abd: soft, Non tender, non distended, BS present Extremities: No edema Skin: no new rashes on exposed skin   Data Reviewed:   CBC: Recent Labs  Lab 02/25/22 1444 02/26/22 0852 03/01/22 0945 03/02/22 0039 03/03/22 0034  WBC 9.5  --  13.4* 11.5* 12.5*  NEUTROABS 9.0*  --  10.6*  --   --   HGB 10.9* 9.9* 10.7* 9.7* 9.6*  HCT 34.5* 29.0* 34.7* 30.7* 30.9*  MCV 75*  --  74.9* 74.3* 75.7*  PLT 434  --  366 365 [id number]   Basic Metabolic Panel: Recent Labs  Lab 02/25/22 1444 02/26/22 0852 03/01/22 0945 03/02/22 0039 03/03/22 0034  NA 141 145 142 139 140  K 4.4 3.7 3.8 3.3* 4.4  CL 106 110 103 100 105  CO2 22  --  '27 26 25  '$ GLUCOSE 231* 111* 106* 106* 123*  BUN 19 21  $'21 19 20  'w$ CREATININE 1.03 1.00 1.24 1.11 1.02  CALCIUM 9.4  --  9.9 8.8* 8.8*   GFR: Estimated Creatinine Clearance: 72.7 mL/min (by C-G formula based on SCr of 1.02 mg/dL). Liver Function Tests: Recent Labs  Lab 03/01/22 0945 03/02/22 0039  AST 26 29  ALT 28 30  ALKPHOS 92 93  BILITOT 0.6 0.8  PROT 7.4 6.2*  ALBUMIN 4.1 2.7*   No results for input(s): "LIPASE", "AMYLASE" in the last 168 hours. No results for input(s): "AMMONIA" in the last 168 hours. Coagulation Profile: Recent Labs  Lab 02/25/22 1350 03/01/22 0945 03/02/22 0039  03/03/22 0034  INR 2.7 3.3* 2.8* 2.1*   Cardiac Enzymes: No results for input(s): "CKTOTAL", "CKMB", "CKMBINDEX", "TROPONINI" in the last 168 hours. BNP (last 3 results) No results for input(s): "PROBNP" in the last 8760 hours. HbA1C: Recent Labs    03/02/22 0039  HGBA1C 6.8*   CBG: Recent Labs  Lab 03/02/22 0610 03/02/22 1046 03/02/22 1603 03/02/22 2047 03/03/22 0609  GLUCAP 98 115* 139* 111* 116*   Lipid Profile: No results for input(s): "CHOL", "HDL", "LDLCALC", "TRIG", "CHOLHDL", "LDLDIRECT" in the last 72 hours. Thyroid Function Tests: No results for input(s): "TSH", "T4TOTAL", "FREET4", "T3FREE", "THYROIDAB" in the last 72 hours. Anemia Panel: No results for input(s): "VITAMINB12", "FOLATE", "FERRITIN", "TIBC", "IRON", "RETICCTPCT" in the last 72 hours. Urine analysis:    Component Value Date/Time   COLORURINE YELLOW 05/03/2017 0610   APPEARANCEUR CLEAR 05/03/2017 0610   LABSPEC 1.020 05/03/2017 0610   PHURINE 6.0 05/03/2017 0610   GLUCOSEU NEGATIVE 05/03/2017 0610   HGBUR TRACE (A) 05/03/2017 0610   BILIRUBINUR NEGATIVE 05/03/2017 0610   KETONESUR NEGATIVE 05/03/2017 0610   PROTEINUR NEGATIVE 05/03/2017 0610   UROBILINOGEN 0.2 12/24/2011 0805   NITRITE NEGATIVE 05/03/2017 0610   LEUKOCYTESUR NEGATIVE 05/03/2017 0610   Sepsis Labs: '@LABRCNTIP'$ (procalcitonin:4,lacticidven:4)  ) Recent Results (from the past 240 hour(s))  Resp panel by RT-PCR (RSV, Flu A&B, Covid) Anterior Nasal Swab     Status: None   Collection Time: 03/01/22  9:18 AM   Specimen: Anterior Nasal Swab  Result Value Ref Range Status   SARS Coronavirus 2 by RT PCR NEGATIVE NEGATIVE Final    Comment: (NOTE) SARS-CoV-2 target nucleic acids are NOT DETECTED.  The SARS-CoV-2 RNA is generally detectable in upper respiratory specimens during the acute phase of infection. The lowest concentration of SARS-CoV-2 viral copies this assay can detect is 138 copies/mL. A negative result does not  preclude SARS-Cov-2 infection and should not be used as the sole basis for treatment or other patient management decisions. A negative result may occur with  improper specimen collection/handling, submission of specimen other than nasopharyngeal swab, presence of viral mutation(s) within the areas targeted by this assay, and inadequate number of viral copies(<138 copies/mL). A negative result must be combined with clinical observations, patient history, and epidemiological information. The expected result is Negative.  Fact Sheet for Patients:  EntrepreneurPulse.com.au  Fact Sheet for Healthcare Providers:  IncredibleEmployment.be  This test is no t yet approved or cleared by the [place] FDA and  has been authorized for detection and/or diagnosis of SARS-CoV-2 by FDA under an Emergency Use Authorization (EUA). This EUA will remain  in effect (meaning this test can be used) for the duration of the COVID-19 declaration under Section 564(b)(1) of the Act, 21 U.S.C.section 360bbb-3(b)(1), unless the authorization is terminated  or revoked sooner.       Influenza A by PCR NEGATIVE NEGATIVE Final  Influenza B by PCR NEGATIVE NEGATIVE Final    Comment: (NOTE) The Xpert Xpress SARS-CoV-2/FLU/RSV plus assay is intended as an aid in the diagnosis of influenza from Nasopharyngeal swab specimens and should not be used as a sole basis for treatment. Nasal washings and aspirates are unacceptable for Xpert Xpress SARS-CoV-2/FLU/RSV testing.  Fact Sheet for Patients: EntrepreneurPulse.com.au  Fact Sheet for Healthcare Providers: IncredibleEmployment.be  This test is not yet approved or cleared by the [place] FDA and has been authorized for detection and/or diagnosis of SARS-CoV-2 by FDA under an Emergency Use Authorization (EUA). This EUA will remain in effect (meaning this test can be used) for the  duration of the COVID-19 declaration under Section 564(b)(1) of the Act, 21 U.S.C. section 360bbb-3(b)(1), unless the authorization is terminated or revoked.     Resp Syncytial Virus by PCR NEGATIVE NEGATIVE Final    Comment: (NOTE) Fact Sheet for Patients: EntrepreneurPulse.com.au  Fact Sheet for Healthcare Providers: IncredibleEmployment.be  This test is not yet approved or cleared by the [place] FDA and has been authorized for detection and/or diagnosis of SARS-CoV-2 by FDA under an Emergency Use Authorization (EUA). This EUA will remain in effect (meaning this test can be used) for the duration of the COVID-19 declaration under Section 564(b)(1) of the Act, 21 U.S.C. section 360bbb-3(b)(1), unless the authorization is terminated or revoked.  Performed at [facility], [address], [place], [place] [address]   MRSA Next Gen by PCR, Nasal     Status: None   Collection Time: 03/01/22 11:02 PM  Result Value Ref Range Status   MRSA by PCR Next Gen NOT DETECTED NOT DETECTED Final    Comment: (NOTE) The GeneXpert MRSA Assay (FDA approved for NASAL specimens only), is one component of a comprehensive MRSA colonization surveillance program. It is not intended to diagnose MRSA infection nor to guide or monitor treatment for MRSA infections. Test performance is not FDA approved in patients less than 71 years old. Performed at [facility], [place] [address] [place], [place] [address]      Radiology Studies: ECHOCARDIOGRAM COMPLETE  Result Date: 03/02/2022    ECHOCARDIOGRAM REPORT   Patient Name:   PEREGRINE STUDZINSKI Date of Exam: 03/02/2022 Medical Rec #:  [id number]         Height:       70.0 in Accession #:    [id number]        Weight:       234.1 lb Date of Birth:  June 29, 1945          BSA:          2.232 m Patient Age:    68 years          BP:           136/67 mmHg Patient Gender: M                 HR:           61  bpm. Exam Location:  [facility] Procedure: 2D Echo, 3D Echo, Cardiac Doppler, Color Doppler and Strain Analysis Indications:    A-Fib  History:        Patient has prior history of Echocardiogram examinations, most                 recent 03/02/2022. CAD and Previous Myocardial Infarction; Risk                 Factors:Dyslipidemia, Diabetes, Sleep Apnea and  Former Smoker.  Sonographer:    Wilkie Aye RVT RCS Referring Phys: [id number] [place]  1. Left ventricular ejection fraction, by estimation, is 50%. The left ventricle has mildly decreased function. The left ventricle demonstrates global hypokinesis. There is moderate concentric left ventricular hypertrophy. Left ventricular diastolic parameters are consistent with Grade II diastolic dysfunction (pseudonormalization). Elevated left atrial pressure.  2. Right ventricular systolic function is mildly reduced. The right ventricular size is normal. There is mildly elevated pulmonary artery systolic pressure. The estimated right ventricular systolic pressure is [id number] mmHg.  3. Left atrial size was severely dilated.  4. The mitral valve is normal in structure. Trivial mitral valve regurgitation. No evidence of mitral stenosis. Moderate mitral annular calcification.  5. The aortic valve is tricuspid. There is mild calcification of the aortic valve. There is mild thickening of the aortic valve. Aortic valve regurgitation is not visualized. Aortic valve sclerosis/calcification is present, without any evidence of aortic stenosis.  6. The inferior vena cava is normal in size with greater than 50% respiratory variability, suggesting right atrial pressure of 3 mmHg. Comparison(s): No significant change from prior study. Prior images reviewed side by side. FINDINGS  Left Ventricle: Left ventricular ejection fraction, by estimation, is 50%. The left ventricle has mildly decreased function. The left ventricle demonstrates global hypokinesis. 3D left ventricular  ejection fraction analysis performed but not reported based on interpreter judgement due to suboptimal tracking. The left ventricular internal cavity size was normal in size. There is moderate concentric left ventricular hypertrophy. Left ventricular diastolic parameters are consistent with Grade II diastolic dysfunction (pseudonormalization). Elevated left atrial pressure. Right Ventricle: The right ventricular size is normal. No increase in right ventricular wall thickness. Right ventricular systolic function is mildly reduced. There is mildly elevated pulmonary artery systolic pressure. The tricuspid regurgitant velocity  is 2.85 m/s, and with an assumed right atrial pressure of 3 mmHg, the estimated right ventricular systolic pressure is Q000111Q mmHg. Left Atrium: Left atrial size was severely dilated. Right Atrium: Right atrial size was normal in size. Pericardium: There is no evidence of pericardial effusion. Mitral Valve: The mitral valve is normal in structure. Moderate mitral annular calcification. Trivial mitral valve regurgitation. No evidence of mitral valve stenosis. Tricuspid Valve: The tricuspid valve is normal in structure. Tricuspid valve regurgitation is trivial. Aortic Valve: The aortic valve is tricuspid. There is mild calcification of the aortic valve. There is mild thickening of the aortic valve. Aortic valve regurgitation is not visualized. Aortic valve sclerosis/calcification is present, without any evidence of aortic stenosis. Aortic valve mean gradient measures 7.3 mmHg. Aortic valve peak gradient measures 12.7 mmHg. Aortic valve area, by VTI measures 1.84 cm. Pulmonic Valve: The pulmonic valve was grossly normal. Pulmonic valve regurgitation is not visualized. Aorta: The aortic root and ascending aorta are structurally normal, with no evidence of dilitation. Venous: The inferior vena cava is normal in size with greater than 50% respiratory variability, suggesting right atrial pressure of 3  mmHg. IAS/Shunts: No atrial level shunt detected by color flow Doppler.  LEFT VENTRICLE PLAX 2D LVIDd:         5.20 cm      Diastology LVIDs:         4.50 cm      LV e' medial:    5.57 cm/s LV PW:         1.50 cm      LV E/e' medial:  18.3 LV IVS:        1.30 cm  LV e' lateral:   5.46 cm/s LVOT diam:     2.20 cm      LV E/e' lateral: 18.7 LV SV:         69 LV SV Index:   31           2D Longitudinal Strain LVOT Area:     3.80 cm     2D Strain GLS Avg:     -11.8 %  LV Volumes (MOD) LV vol d, MOD A2C: 91.0 ml  3D Volume EF: LV vol d, MOD A4C: 162.0 ml 3D EF:        54 % LV vol s, MOD A2C: 54.5 ml  LV EDV:       128 ml LV vol s, MOD A4C: 86.9 ml  LV ESV:       59 ml LV SV MOD A2C:     36.5 ml  LV SV:        69 ml LV SV MOD A4C:     162.0 ml LV SV MOD BP:      55.9 ml RIGHT VENTRICLE            IVC RV Basal diam:  3.80 cm    IVC diam: 2.20 cm RV S prime:     9.51 cm/s TAPSE (M-mode): 1.5 cm LEFT ATRIUM              Index        RIGHT ATRIUM           Index LA diam:        5.40 cm  2.42 cm/m   RA Area:     19.50 cm LA Vol (A2C):   96.2 ml  43.10 ml/m  RA Volume:   48.20 ml  21.60 ml/m LA Vol (A4C):   124.0 ml 55.56 ml/m LA Biplane Vol: 113.0 ml 50.63 ml/m  AORTIC VALVE                     PULMONIC VALVE AV Area (Vmax):    1.99 cm      PV Vmax:       1.10 m/s AV Area (Vmean):   1.77 cm      PV Peak grad:  4.8 mmHg AV Area (VTI):     1.84 cm AV Vmax:           178.00 cm/s AV Vmean:          129.000 cm/s AV VTI:            0.374 m AV Peak Grad:      12.7 mmHg AV Mean Grad:      7.3 mmHg LVOT Vmax:         93.00 cm/s LVOT Vmean:        59.900 cm/s LVOT VTI:          0.181 m LVOT/AV VTI ratio: 0.49  AORTA Ao Root diam: 2.90 cm Ao Asc diam:  3.10 cm Ao Arch diam: 2.6 cm MITRAL VALVE                TRICUSPID VALVE MV Area (PHT): 4.47 cm     TR Peak grad:   32.5 mmHg MV Decel Time: 170 msec     TR Vmax:        285.00 cm/s MV E velocity: 102.10 cm/s MV A velocity: 40.80 cm/s   SHUNTS MV E/A ratio:  2.50  Systemic VTI:  0.18 m                             Systemic Diam: 2.20 cm Sanda Klein MD Electronically signed by Sanda Klein MD Signature Date/Time: 03/02/2022/3:17:58 PM    Final    CT Chest Wo Contrast  Result Date: 03/01/2022 CLINICAL DATA:  Nondiagnostic x-ray. Respiratory illness. Recent atrial fibrillation cardioversion. EXAM: CT CHEST WITHOUT CONTRAST TECHNIQUE: Multidetector CT imaging of the chest was performed following the standard protocol without IV contrast. RADIATION DOSE REDUCTION: This exam was performed according to the departmental dose-optimization program which includes automated exposure control, adjustment of the mA and/or kV according to patient size and/or use of iterative reconstruction technique. COMPARISON:  Radiography same day.  Radiography 09/25/2021. FINDINGS: Cardiovascular: Previous median sternotomy and CABG. Cardiomegaly. Coronary artery calcification and/or stents. Aortic atherosclerotic calcification without aneurysm. Mediastinum/Nodes: Mildly reactive hilar and mediastinal lymph nodes. Lungs/Pleura: Small pleural effusion on the left layering dependently. Widespread but patchy pulmonary infiltrate, more extensive within the left lung than the right, consistent with bronchopneumonia. Upper Abdomen: Cholelithiasis. No CT evidence of cholecystitis or obstruction Musculoskeletal: Ordinary chronic spinal degenerative changes. IMPRESSION: 1. Widespread but patchy pulmonary infiltrate, more extensive within the left lung than the right, consistent with bronchopneumonia. Small left pleural effusion layering dependently. 2. Previous median sternotomy and CABG. Cardiomegaly. Coronary artery calcification and/or stents. 3. Cholelithiasis without CT evidence of cholecystitis or obstruction. Aortic Atherosclerosis (ICD10-I70.0). Electronically Signed   By: Nelson Chimes M.D.   On: 03/01/2022 11:43     Scheduled Meds:  azithromycin  500 mg Oral Daily   carvedilol  25 mg Oral BID    clopidogrel  75 mg Oral Daily   empagliflozin  10 mg Oral Daily   furosemide  40 mg Intravenous Daily   insulin aspart  0-15 Units Subcutaneous TID WC   levothyroxine  137 mcg Oral QAC breakfast   losartan  25 mg Oral Daily   pantoprazole  40 mg Oral Daily   predniSONE  5 mg Oral Q breakfast   spironolactone  12.5 mg Oral Daily   venlafaxine XR  150 mg Oral Daily   warfarin  5 mg Oral ONCE-1600   Warfarin - Pharmacist Dosing Inpatient   Does not apply q1600   Continuous Infusions:  cefTRIAXone (ROCEPHIN)  IV Stopped (03/02/22 2220)     LOS: 2 days    Time spent:32mn    PDomenic Polite MD [facility]   03/03/2022, 11:32 AM

## 2022-03-02 NOTE — TOC Initial Note (Signed)
Transition of Care [facility]) - Initial/Assessment Note    Patient Details  Name: Gary [name] MRN: [id number] Date of Birth: [birth year]  Transition of Care [facility]) CM/SW Contact:    Zenon Mayo, RN Phone Number: 03/02/2022, 4:18 PM  Clinical Narrative:                 From home with wife, indep.  Presents with acute resp failure, bronchial pna, conts on iv abx.  Wife will transport home at dc. TOC following.  Expected Discharge Plan: Home/Self Care Barriers to Discharge: Continued Medical Work up   Patient Goals and CMS Choice Patient states their goals for this hospitalization and ongoing recovery are:: return home with wife   Choice offered to / list presented to : NA      Expected Discharge Plan and Services In-house Referral: NA Discharge Planning Services: CM Consult Post Acute Care Choice: NA Living arrangements for the past 2 months: Single Family Home                 DME Arranged: N/A DME Agency: NA       HH Arranged: NA          Prior Living Arrangements/Services Living arrangements for the past 2 months: Single Family Home Lives with:: Spouse Patient language and need for interpreter reviewed:: Yes Do you feel safe going back to the place where you live?: Yes      Need for Family Participation in Patient Care: Yes (Comment) Care giver support system in place?: Yes (comment)   Criminal Activity/Legal Involvement Pertinent to Current Situation/Hospitalization: No - Comment as needed  Activities of Daily Living Home Assistive Devices/Equipment: None ADL Screening (condition at time of admission) Patient's cognitive ability adequate to safely complete daily activities?: Yes Is the patient deaf or have difficulty hearing?: No Does the patient have difficulty seeing, even when wearing glasses/contacts?: No Does the patient have difficulty concentrating, remembering, or making decisions?: No Patient able to express need for assistance with ADLs?:  Yes Does the patient have difficulty dressing or bathing?: No Independently performs ADLs?: Yes (appropriate for developmental age) Does the patient have difficulty walking or climbing stairs?: No Weakness of Legs: Both Weakness of Arms/Hands: Both  Permission Sought/Granted                  Emotional Assessment Appearance:: Appears stated age Attitude/Demeanor/Rapport: Engaged Affect (typically observed): Appropriate Orientation: : Oriented to Self, Oriented to Place, Oriented to  Time, Oriented to Situation Alcohol / Substance Use: Not Applicable Psych Involvement: No (comment)  Admission diagnosis:  Acute respiratory failure with hypoxia (HCC) [J96.01] Hypervolemia, unspecified hypervolemia type [E87.70] Congestive heart failure, unspecified HF chronicity, unspecified heart failure type (HCC) [I50.9] Patient Active Problem List   Diagnosis Date Noted   CAP (community acquired pneumonia) 03/02/2022   Acute respiratory failure with hypoxia ([place]) 03/01/2022   Atypical atrial flutter ([place]) 02/26/2022   Non-ST elevation (NSTEMI) myocardial infarction [facility])    Chest pain 09/25/2021   Chest pain due to coronary artery disease ([place]) 09/25/2021   Hypernatremia 03/10/2021   Long term (current) use of anticoagulants 02/12/2019   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 ([place]) 01/24/2019   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[place]) 11/18/2017   Obstructive sleep apnea (adult) (pediatric) 11/09/2017   Type 2 diabetes mellitus with other specified complication ([place]) [id number]   Acute respiratory failure ([place]) 05/03/2017   Pulmonary edema cardiac cause ([place]) 05/03/2017   Respiratory failure ([place])  05/03/2017   Chronic diastolic CHF (congestive heart failure) ([place]) 02/20/2016   Aortic aneurysm ([place]) 02/20/2016   CAD (coronary artery disease) 11/13/2014   Essential hypertension 11/13/2014   Hyperlipidemia 11/13/2014   Atrial  fibrillation ([place]) 11/13/2014   Osteoarthritis of right hip 01/03/2012   PCP:  [name], DO Pharmacy:   [id number] [place] [place]Phone: [phone]: [phone] [facility] [address]: [phone]: [phone]    Social Determinants of Health (SDOH) Social History: SDOH Screenings   Food Insecurity: No Food Insecurity (03/02/2022)  Housing: Low Risk  (03/02/2022)  Transportation Needs: No Transportation Needs (03/02/2022)  Utilities: Not At Risk (03/02/2022)  Tobacco Use: Medium Risk (03/01/2022)   SDOH Interventions:     Readmission Risk Interventions    03/02/2022    4:16 PM  Readmission Risk Prevention Plan  Transportation Screening Complete  HRI or Home Care Consult Complete  Palliative Care Screening Not Applicable  Medication Review (RN Care Manager) Complete

## 2022-03-02 NOTE — Assessment & Plan Note (Addendum)
Acute hypoxemic respiratory failure.  Patient with bilateral pulmonary infiltrates.   Wbc is 11.3   Plan to continue antibiotic therapy with IV ceftriaxone and oral azithromycin.  Bronchodilator therapy and airway clearing techniques with flutter valve and incentive spirometer.  Continue supplemental 02 per [place] to keep 02 saturation 92% or greater.

## 2022-03-02 NOTE — Plan of Care (Signed)

## 2022-03-02 NOTE — Progress Notes (Signed)
Progress Note   Patient: Gary [name] [id number] DOB: 1945/09/24 DOA: 03/01/2022     1 DOS: the patient was seen and examined on 03/02/2022   Brief hospital course: Mr. Finkel was admitted to the hospital with the working diagnosis of  hypoxemic respiratory failure due to community acquired pneumonia.   77 yo male with the past medical history of paroxysmal atrial fibrillation, coronary artery disease, heart failure, and Wegner's granulomatosis who presented with dyspnea.  Patient had direct current cardioversion on 02/26/22. Reports dyspnea on exertion after procedure, along with 02 desaturation down to high 80's. On his initial physical examination his blood pressure was 163/127, RR 22, HR 62 and 02 saturation 87%. His lungs had coarse breath sounds but not wheezing, heart with S1 and S2 present and rhythmic with no gallops, abdomen with no distention and no lower extremity edema.   Na 142, K 3,8 Cl 103 bicarbonate 27, glucose 106 bun 21 cr 1,24 BNP 471 High sensitive troponin 23 and 23  Wbc 13,4 hgb 10,7 ply 366  Sars covid 19 negative   Chest radiograph with cardiomegaly with bilateral interstitial marking more at the upper lobes with no effusions. Sternotomy wires in place.  CT chest with bilateral asymmetric ground glass opacities, more predominant at bases.   EKG 64 bpm, normal axis, normal intervals, sinus rhythm with poor R R wave progression with no significant ST segment or  T wave changes.   Patient was placed on antibiotic therapy for pneumonia.    Assessment and Plan: CAP (community acquired pneumonia) Acute hypoxemic respiratory failure.  Patient with bilateral pulmonary infiltrates.   Wbc is 11.3   Plan to continue antibiotic therapy with IV ceftriaxone and oral azithromycin.  Bronchodilator therapy and airway clearing techniques with flutter valve and incentive spirometer.  Continue supplemental 02 per [place] to keep 02 saturation 92% or greater.    Chronic  diastolic CHF (congestive heart failure) (HCC) Echocardiogram with preserved LV systolic function with EF 50%, global hypokinesis, moderate LVH, RV systolic function with mild reduction. RVSP 35,5 mmHg. LA with severe dilatation.  Blood pressure systolic is [id number] to [id number] mmHg.   Clinically patient is euvolemic.   Plan to continue medical therapy with carvedilol, and losartan.  Plan to add spironolactone and SGLT 2 inh.   CAD (coronary artery disease) No active chest pain.   Atrial fibrillation (HCC) Continue rate control with carvedilol and anticoagulation with warfarin.  Continue telemetry monitoring.  Currently patient is on sinus rhythm.   Hypokalemia Renal function with serum cr at 1,1 with K at 3,3 and serum bicarbonate at 26. Plan to add Kcl 40 meq x2 and follow up renal panel in am. Change HCTZ to spironolactone and will add SGLT 2 inh.   Type 2 diabetes mellitus with hyperlipidemia (HCC) Continue glucose cover and monitoring with insulin sliding scale.  Continue with statin therapy.   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  Continue home dose of prednisone.   Anemia of chronic disease, cell count has been stable.         Subjective: Patient is feeling better, dyspnea is improving but not back to baseline, no edema or chest pain, no palpitations   Physical Exam: Vitals:   03/02/22 0515 03/02/22 0820 03/02/22 1129 03/02/22 1607  BP: 131/72 121/70 136/67 (!) 117/53  Pulse: 86 68 63 66  Resp: '18 20 18 20  '$ Temp: 98.2 F (36.8 C) 98.1 F (36.7  C) 97.6 F (36.4 C) 98 F (36.7 C)  TempSrc: Oral Oral Oral Oral  SpO2: 99% 97% 96% 96%  Weight: 106.2 kg     Height:       Neurology awake and alert ENT with mild pallor with no icterus Cardiovascular with S1 and S2 present and regular with no gallops, or rubs, positive systolic murmur at the apex No JVD No lower extremity edema Respiratory with  rales at bases more right than left with no wheezing or rhonchi Abdomen with no distention  Data Reviewed:    Family Communication: I spoke with patient's family at the bedside, we talked in detail about patient's condition, plan of care and prognosis and all questions were addressed.   Disposition: Status is: Inpatient Remains inpatient appropriate because: respiratory failure and need for IV antibiotics   Planned Discharge Destination: Home    Author: Tawni Millers, MD 03/02/2022 4:24 PM  For on call review [email or web address].

## 2022-03-02 NOTE — Assessment & Plan Note (Addendum)
Follow up as outpatient.  Continue home dose of prednisone.   Anemia of chronic disease, cell count has been stable.

## 2022-03-02 NOTE — Progress Notes (Signed)
ANTICOAGULATION CONSULT NOTE - Follow Up Consult  Pharmacy Consult for warfarin Indication: atrial fibrillation  Allergies  Allergen Reactions   Statins Other (See Comments)    Muscle pain    Patient Measurements: Height: '5\' 10"'$  (177.8 cm) Weight: 106.2 kg (234 lb 2.1 oz) IBW/kg (Calculated) : 73  Vital Signs: Temp: 97.6 F (36.4 C) (02/27 1129) Temp Source: Oral (02/27 1129) BP: 136/67 (02/27 1129) Pulse Rate: 63 (02/27 1129)  Labs: Recent Labs    03/01/22 0945 03/01/22 1115 03/02/22 0039  HGB 10.7*  --  9.7*  HCT 34.7*  --  30.7*  PLT 366  --  365  LABPROT 33.0*  --  29.3*  INR 3.3*  --  2.8*  CREATININE 1.24  --  1.11  TROPONINIHS 23* 23*  --     Estimated Creatinine Clearance: 66.9 mL/min (by C-G formula based on SCr of 1.11 mg/dL).  Assessment: 56 YOM on warfarin PTA for atrial fibrillation who presented with ongoing SOB following recent cardioversion on 2/23. Pharmacy consulted to manage warfarin.   INR supratherapeutic (3.3) on admit and warfarin held 2/26. INR down to 2.8 today, back into target range. Day # 1 Ceftriaxone and Azithromycin for CAP.  Currently off daily Septra DS for prophylaxis, which sometimes effects dosing.  PTA warfarin regimen confirmed by [facility] clinic note 02/25/22:   2.5 mg MWF and 5 mg TTSS. INR 2.7 on 02/25/22.  Goal of Therapy:  INR 2-3 Monitor platelets by anticoagulation protocol: Yes   Plan:  Warfarin 5 mg x 1 today, usual Tuesday dose. Daily PT/INR for now. Monitor for any need to adjust doses while off Septra DS.  Arty Baumgartner, RPh 03/02/2022,3:25 PM

## 2022-03-02 NOTE — Assessment & Plan Note (Signed)
Continue rate control with carvedilol and anticoagulation with warfarin.  Continue telemetry monitoring.  Currently patient is on sinus rhythm.

## 2022-03-02 NOTE — Hospital Course (Addendum)
Mr. Scobey was admitted to the hospital with the working diagnosis of  hypoxemic respiratory failure due to community acquired pneumonia.   77 yo male with the past medical history of paroxysmal atrial fibrillation, coronary artery disease, heart failure, and Wegner's granulomatosis who presented with dyspnea.  Patient had direct current cardioversion on 02/26/22. Reports dyspnea on exertion after procedure, along with 02 desaturation down to high 80's. On his initial physical examination his blood pressure was 163/127, RR 22, HR 62 and 02 saturation 87%. His lungs had coarse breath sounds but not wheezing, heart with S1 and S2 present and rhythmic with no gallops, abdomen with no distention and no lower extremity edema.   Na 142, K 3,8 Cl 103 bicarbonate 27, glucose 106 bun 21 cr 1,24 BNP 471 High sensitive troponin 23 and 23  Wbc 13,4 hgb 10,7 ply 366  Sars covid 19 negative   Chest radiograph with cardiomegaly with bilateral interstitial marking more at the upper lobes with no effusions. Sternotomy wires in place.  CT chest with bilateral asymmetric ground glass opacities, more predominant at bases.   EKG 64 bpm, normal axis, normal intervals, sinus rhythm with poor R R wave progression with no significant ST segment or  T wave changes.   Patient was placed on antibiotic therapy for pneumonia.

## 2022-03-02 NOTE — Assessment & Plan Note (Signed)
Calculated BMI is 33,5

## 2022-03-02 NOTE — Assessment & Plan Note (Signed)
No active chest pain.

## 2022-03-02 NOTE — Assessment & Plan Note (Signed)
Continue glucose cover and monitoring with insulin sliding scale.  Continue with statin therapy.

## 2022-03-02 NOTE — Assessment & Plan Note (Signed)
Renal function with serum cr at 1,1 with K at 3,3 and serum bicarbonate at 26. Plan to add Kcl 40 meq x2 and follow up renal panel in am. Change HCTZ to spironolactone and will add SGLT 2 inh.

## 2022-03-02 NOTE — Progress Notes (Signed)
Pt refused to wear CPAP tonight. ?

## 2022-03-03 DIAGNOSIS — J9601 Acute respiratory failure with hypoxia: Secondary | ICD-10-CM | POA: Diagnosis not present

## 2022-03-03 LAB — BASIC METABOLIC PANEL
Anion gap: 10 (ref 5–15)
BUN: 20 mg/dL (ref 8–23)
CO2: 25 mmol/L (ref 22–32)
Calcium: 8.8 mg/dL — ABNORMAL LOW (ref 8.9–10.3)
Chloride: 105 mmol/L (ref 98–111)
Creatinine, Ser: 1.02 mg/dL (ref 0.61–1.24)
GFR, Estimated: >60 mL/min (ref >60)
Glucose, Bld: 123 mg/dL — ABNORMAL HIGH (ref 70–99)
Potassium: 4.4 mmol/L (ref 3.5–5.1)
Sodium: 140 mmol/L (ref 135–145)

## 2022-03-03 LAB — GLUCOSE, CAPILLARY
Glucose-Capillary: 116 mg/dL — ABNORMAL HIGH (ref 70–99)
Glucose-Capillary: 160 mg/dL — ABNORMAL HIGH (ref 70–99)
Glucose-Capillary: 90 mg/dL (ref 70–99)
Glucose-Capillary: 129 mg/dL — ABNORMAL HIGH (ref 70–99)

## 2022-03-03 LAB — CBC
HCT: 30.9 % — ABNORMAL LOW (ref 39.0–52.0)
Hemoglobin: 9.6 g/dL — ABNORMAL LOW (ref 13.0–17.0)
MCH: 23.5 pg — ABNORMAL LOW (ref 26.0–34.0)
MCHC: 31.1 g/dL (ref 30.0–36.0)
MCV: 75.7 fL — ABNORMAL LOW (ref 80.0–100.0)
Platelets: 366 10*3/uL (ref 150–400)
RBC: 4.08 MIL/uL — ABNORMAL LOW (ref 4.22–5.81)
RDW: 18 % — ABNORMAL HIGH (ref 11.5–15.5)
WBC: 12.5 10*3/uL — ABNORMAL HIGH (ref 4.0–10.5)
nRBC: 0 % (ref 0.0–0.2)

## 2022-03-03 LAB — PROTIME-INR
INR: 2.1 — ABNORMAL HIGH (ref 0.8–1.2)
Prothrombin Time: 23 seconds — ABNORMAL HIGH (ref 11.4–15.2)

## 2022-03-03 LAB — HEMOGLOBIN A1C
Hgb A1c MFr Bld: 6.8 % — ABNORMAL HIGH (ref 4.8–5.6)
Mean Plasma Glucose: 148 mg/dL

## 2022-03-03 MED ORDER — AZITHROMYCIN 250 MG PO TABS
500.0000 mg | ORAL_TABLET | Freq: Every day | ORAL | Status: DC
  Administered 2022-03-03 – 2022-03-06 (×4): 500 mg via ORAL
  Filled 2022-03-03 (×4): qty 2

## 2022-03-03 MED ORDER — FUROSEMIDE 10 MG/ML IJ SOLN
40.0000 mg | Freq: Every day | INTRAMUSCULAR | Status: DC
  Administered 2022-03-03 – 2022-03-04 (×2): 40 mg via INTRAVENOUS
  Filled 2022-03-03 (×2): qty 4

## 2022-03-03 MED ORDER — LOSARTAN POTASSIUM 25 MG PO TABS
25.0000 mg | ORAL_TABLET | Freq: Every day | ORAL | Status: DC
  Administered 2022-03-03 – 2022-03-05 (×3): 25 mg via ORAL
  Filled 2022-03-03 (×4): qty 1

## 2022-03-03 NOTE — Progress Notes (Signed)
Patient given incentive spirometer (IS). IS performed x3- patient tolerated well, achieved goal of 1050m. Patient education to perform 10 times every hour.

## 2022-03-03 NOTE — Progress Notes (Signed)
Nurse requested Mobility Specialist to perform oxygen saturation test with pt which includes removing pt from oxygen both at rest and while ambulating.  Below are the results from that testing.     Patient Saturations on Room Air at Rest = spO2 90%  Patient Saturations on Room Air while Ambulating = sp02 84% .  Rested and performed pursed lip breathing for 1 minute with sp02 at 86%.  Patient Saturations on 2 Liters of oxygen while Ambulating = sp02 95%  At end of testing pt left in room on 2  Liters of oxygen.  Reported results to nurse.   [place] Specialist Please contact via SecureChat or Rehab office at [phone]

## 2022-03-03 NOTE — Progress Notes (Signed)
ANTICOAGULATION CONSULT NOTE - Follow Up Consult  Pharmacy Consult for warfarin Indication: atrial fibrillation  Allergies  Allergen Reactions   Statins Other (See Comments)    Muscle pain    Patient Measurements: Height: '5\' 10"'$  (177.8 cm) Weight: 105.8 kg (233 lb 4 oz) IBW/kg (Calculated) : 73  Vital Signs: Temp: 98.3 F (36.8 C) (02/28 0515) Temp Source: Oral (02/28 0515) BP: 124/74 (02/28 0515) Pulse Rate: 66 (02/28 0515)  Labs: Recent Labs    03/01/22 0945 03/01/22 1115 03/02/22 0039 03/03/22 0034  HGB 10.7*  --  9.7* 9.6*  HCT 34.7*  --  30.7* 30.9*  PLT 366  --  365 366  LABPROT 33.0*  --  29.3* 23.0*  INR 3.3*  --  2.8* 2.1*  CREATININE 1.24  --  1.11 1.02  TROPONINIHS 23* 23*  --   --      Estimated Creatinine Clearance: 72.7 mL/min (by C-G formula based on SCr of 1.02 mg/dL).  Assessment: 65 YOM on warfarin PTA for atrial fibrillation presented with ongoing SOB following recent cardioversion on 2/23. Pharmacy consulted to manage warfarin.   INR supratherapeutic (3.3) on admit and warfarin held 2/26. INR down to 2.8 on 2/27, and warfarin dose ordered but dose missed. INR down to 2.1 today, remains therapeutic. Patient continuing on ceftriaxone and azithromycin for CAP. Currently off daily Septra DS for prophylaxis, which can affect INR.  PTA warfarin regimen confirmed by [facility] clinic note 02/25/22: 2.5 mg MWF and 5 mg TTSS. INR 2.7 on 02/25/22.  Goal of Therapy:  INR 2-3 Monitor platelets by anticoagulation protocol: Yes   Plan:  Warfarin 5 mg x 1 again today (higher than PTA with dose missed 2/27) Monitor daily INR, CBC, s/sx bleeding Monitor for any need to adjust doses while off Septra DS, DDI with azithromycin   Arturo Morton, PharmD, BCPS Please check AMION for all [place] contact numbers Clinical Pharmacist 03/03/2022 8:30 AM

## 2022-03-03 NOTE — Consult Note (Signed)
   [facility] Inpatient Consult   03/03/2022  DEZMEN GEST Apr 28, 1945 OR:[id number]  Orientation with Natividad Brood, [place] [facility] Liaison for review.   Location: [facility] Liaison met with pt via bedside [facility]).   [facility]) [place] Patient: Insurance (Medicare ACO [place])    Primary Care Provider:  [name], DO  with [place]  Patient screened for less than 30 days readmission hospitalization with noted high risk score for unplanned readmission risk to assess for potential [facility]) Care Management service needs for post hospital transition for care coordination. Pt was given an appointment reminder card and 24 hours Nurse Advise Line magnet. Pt inquired about a request for an Incentive Spirometry this AM however did not receive. RN intervened and offered to inform bedside nurse (pt receptive). No other request presented at this time.   Plan:  HIPAA verified and [facility] RN will continue to follow ongoing disposition to assess for post hospital community care coordination/management needs.  Referral request for community care coordination: High risk for readmission and recent pneumonia.    [place] does not replace or interfere with any arrangements made by the Inpatient Transition of Care team.   For questions contact:   Raina Mina, RN, [place] Hours M-F 8:00 am to 4:30 pm [phone] [Office toll free line][facility] Office Hours are M-F 8:30 - 5 pm 24 hour nurse advise line [phone] Conceirge  [email or web address]

## 2022-03-03 NOTE — Plan of Care (Signed)

## 2022-03-03 NOTE — Progress Notes (Addendum)
0734: Informed by nightshift nurse during bedside report that plan is for patient to have cardioversion, but no NPO orders was placed and cardiology has not been around to see him. Per night shift, patient has opted to remain NPO since midnight.   [id number]: Message sent to MD to clarify.

## 2022-03-04 ENCOUNTER — Ambulatory Visit: Payer: PRIVATE HEALTH INSURANCE

## 2022-03-04 DIAGNOSIS — J9601 Acute respiratory failure with hypoxia: Secondary | ICD-10-CM | POA: Diagnosis not present

## 2022-03-04 LAB — GLUCOSE, CAPILLARY
Glucose-Capillary: 114 mg/dL — ABNORMAL HIGH (ref 70–99)
Glucose-Capillary: 123 mg/dL — ABNORMAL HIGH (ref 70–99)
Glucose-Capillary: 99 mg/dL (ref 70–99)
Glucose-Capillary: 112 mg/dL — ABNORMAL HIGH (ref 70–99)

## 2022-03-04 LAB — CBC
HCT: 33.8 % — ABNORMAL LOW (ref 39.0–52.0)
Hemoglobin: 10.3 g/dL — ABNORMAL LOW (ref 13.0–17.0)
MCH: 23 pg — ABNORMAL LOW (ref 26.0–34.0)
MCHC: 30.5 g/dL (ref 30.0–36.0)
MCV: 75.4 fL — ABNORMAL LOW (ref 80.0–100.0)
Platelets: 439 10*3/uL — ABNORMAL HIGH (ref 150–400)
RBC: 4.48 MIL/uL (ref 4.22–5.81)
RDW: 17.9 % — ABNORMAL HIGH (ref 11.5–15.5)
WBC: 11.7 10*3/uL — ABNORMAL HIGH (ref 4.0–10.5)
nRBC: 0 % (ref 0.0–0.2)

## 2022-03-04 LAB — LEGIONELLA PNEUMOPHILA SEROGP 1 UR AG: L. pneumophila Serogp 1 Ur Ag: NEGATIVE

## 2022-03-04 LAB — PROTIME-INR
INR: 1.7 — ABNORMAL HIGH (ref 0.8–1.2)
Prothrombin Time: 20 seconds — ABNORMAL HIGH (ref 11.4–15.2)

## 2022-03-04 MED ORDER — WARFARIN SODIUM 5 MG PO TABS
5.0000 mg | ORAL_TABLET | Freq: Once | ORAL | Status: AC
  Administered 2022-03-04: 5 mg via ORAL
  Filled 2022-03-04: qty 1

## 2022-03-04 NOTE — Progress Notes (Signed)
ANTICOAGULATION CONSULT NOTE - Follow Up Consult  Pharmacy Consult for warfarin Indication: atrial fibrillation  Allergies  Allergen Reactions   Statins Other (See Comments)    Muscle pain    Patient Measurements: Height: '5\' 10"'$  (177.8 cm) Weight: 107 kg (235 lb 14.3 oz) IBW/kg (Calculated) : 73  Vital Signs: Temp: 98.4 F (36.9 C) (02/29 1200) Temp Source: Oral (02/29 1200) BP: 96/59 (02/29 1200) Pulse Rate: 50 (02/29 1200)  Labs: Recent Labs    03/02/22 0039 03/03/22 0034 03/04/22 0033  HGB 9.7* 9.6* 10.3*  HCT 30.7* 30.9* 33.8*  PLT 365 366 439*  LABPROT 29.3* 23.0* 20.0*  INR 2.8* 2.1* 1.7*  CREATININE 1.11 1.02  --      Estimated Creatinine Clearance: 73.1 mL/min (by C-G formula based on SCr of 1.02 mg/dL).  Assessment: 69 YOM on warfarin PTA for atrial fibrillation who presented with ongoing SOB following recent cardioversion on 2/23. Pharmacy consulted to manage warfarin.   INR supratherapeutic (3.3) on admit and warfarin held 2/26. INR down to 2.8 on 2/27 but warfarin dose missed. INR 2.1 on 2/8 > warfarin 5 mg given rather than usual Wed 2.5 mg > 1.7 today.  Continues Ceftriaxone and Azithromycin for CAP, and off daily Septra DS for prophylaxis. Azithromycin and Septra DS can effect INR.   PTA warfarin regimen confirmed by [facility] clinic note 02/25/22:   2.5 mg MWF and 5 mg TTSS. INR 2.7 on 02/25/22.  Goal of Therapy:  INR 2-3 Monitor platelets by anticoagulation protocol: Yes   Plan:  Warfarin 5 mg x 1 today, usual Thursday dose. Daily PT/INR for now. Monitor dosing needs while off Septra DS and on Azithromycin.  Arty Baumgartner, RPh 03/04/2022,12:20 PM

## 2022-03-04 NOTE — Progress Notes (Signed)
PROGRESS NOTE    Gary [name]  [id number] DOB: 11/18/1945 DOA: 03/01/2022 PCP: [name], DO  77/M w/ PAfib, CAD chronic diastolic CHF, Wegner's granulomatosis presented with dyspnea.  Patient had DCCV on 02/26/22. Reports dyspnea on exertion after procedure, along with 02 desaturation down to high 80's. Labs creat 1,24, BNP 471, troponin 23 and 23  CXR w/cardiomegaly with bilateral interstitial markings CT chest with bilateral bronchopneumonia -on antibiotic therapy for pneumonia.   Subjective: -Still feels weak, some dyspnea with activity, ambulating in the halls, cough is improving  Assessment and Plan:  Aspiration pneumonia Acute hypoxemic respiratory failure.  CXR w/ bilateral pulmonary infiltrates.,  Confirmed on CT -continue antibiotic therapy with IV ceftriaxone and oral azithromycin.  -wean O2 -Add incentive spirometry, out of bed to chair -Transition to oral antibiotics tomorrow -Suspect aspiration in the background of recent DCCV, no history of dysphagia  Acute on chronic diastolic CHF (congestive heart failure) (HCC) -Echo with EF [id number], grade 2 diastolic dysfunction, global hypokinesis, moderate LVH, RV systolic function with mild reduction -Repeat IV Lasix today, then hold, continue Jardiance and Aldactone, continue carvedilol, losartan  -BMP in a.m.  CAD (coronary artery disease) -Stable, no chest pain  P.Atrial fibrillation (HCC) Continue  carvedilol and warfarin.  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  Continue home dose of prednisone.   Anemia of chronic disease, cell count has been stable.   DVT prophylaxis: Warfarin Code Status: Full Code Family Communication: None present Disposition Plan: Home likely 1 to 2  days  Consultants:    Procedures:   Antimicrobials:    Objective: Vitals:   03/03/22 2015 03/04/22 0017 03/04/22 0515 03/04/22 0742  BP: (!) 115/59 128/71 (!) 106/59 130/71  Pulse: 63 72 62 65  Resp:    18  Temp: 98 F (36.7 C) 97.8 F (36.6 C)  97.9 F (36.6 C)  TempSrc: Oral Oral  Oral  SpO2: 95% 95%  96%  Weight:  107 kg    Height:        Intake/Output Summary (Last 24 hours) at 03/04/2022 1118 Last data filed at 03/04/2022 0824 Gross per 24 hour  Intake 1020 ml  Output 300 ml  Net 720 ml   Filed Weights   03/02/22 0515 03/03/22 0515 03/04/22 0017  Weight: 106.2 kg 105.8 kg 107 kg    Examination: Chronically ill male sitting up in recliner, AAOx3, no distress HEENT: Positive JVD CVS: Bilateral Rales Abdomen: Soft, nontender, bowel sounds present Extremities: No edema Skin: no new rashes on exposed skin   Data Reviewed:   CBC: Recent Labs  Lab 02/25/22 1444 02/26/22 0852 03/01/22 0945 03/02/22 0039 03/03/22 0034 03/04/22 0033  WBC 9.5  --  13.4* 11.5* 12.5* 11.7*  NEUTROABS 9.0*  --  10.6*  --   --   --   HGB 10.9* 9.9* 10.7* 9.7* 9.6* 10.3*  HCT 34.5* 29.0* 34.7* 30.7* 30.9* 33.8*  MCV 75*  --  74.9* 74.3* 75.7* 75.4*  PLT 434  --  366 365 366 123456*   Basic Metabolic Panel: Recent Labs  Lab 02/25/22 1444 02/26/22 0852 03/01/22 0945 03/02/22 0039 03/03/22 0034  NA 141 145 142 139 140  K 4.4 3.7 3.8 3.3* 4.4  CL 106 110 103 100 105  CO2 22  --  $'27 26 25  'u$ GLUCOSE 231* 111* 106* 106* 123*  BUN '19 21 21 19 20  '$ CREATININE 1.03 1.00 1.24 1.11 1.02  CALCIUM 9.4  --  9.9 8.8* 8.8*   GFR: Estimated Creatinine Clearance: 73.1 mL/min (by C-G formula based on SCr of 1.02 mg/dL). Liver Function Tests: Recent Labs  Lab 03/01/22 0945 03/02/22 0039  AST 26 29  ALT 28 30  ALKPHOS 92 93  BILITOT 0.6 0.8  PROT 7.4 6.2*  ALBUMIN 4.1 2.7*   No results for input(s): "LIPASE", "AMYLASE" in the last 168 hours. No results for input(s): "AMMONIA"  in the last 168 hours. Coagulation Profile: Recent Labs  Lab 02/25/22 1350 03/01/22 0945 03/02/22 0039 03/03/22 0034 03/04/22 0033  INR 2.7 3.3* 2.8* 2.1* 1.7*   Cardiac Enzymes: No results for input(s): "CKTOTAL", "CKMB", "CKMBINDEX", "TROPONINI" in the last 168 hours. BNP (last 3 results) No results for input(s): "PROBNP" in the last 8760 hours. HbA1C: Recent Labs    03/02/22 0039  HGBA1C 6.8*   CBG: Recent Labs  Lab 03/03/22 1139 03/03/22 1604 03/03/22 2057 03/04/22 0627 03/04/22 1115  GLUCAP 160* 90 129* 114* 123*   Lipid Profile: No results for input(s): "CHOL", "HDL", "LDLCALC", "TRIG", "CHOLHDL", "LDLDIRECT" in the last 72 hours. Thyroid Function Tests: No results for input(s): "TSH", "T4TOTAL", "FREET4", "T3FREE", "THYROIDAB" in the last 72 hours. Anemia Panel: No results for input(s): "VITAMINB12", "FOLATE", "FERRITIN", "TIBC", "IRON", "RETICCTPCT" in the last 72 hours. Urine analysis:    Component Value Date/Time   COLORURINE YELLOW 05/03/2017 0610   APPEARANCEUR CLEAR 05/03/2017 0610   LABSPEC 1.020 05/03/2017 0610   PHURINE 6.0 05/03/2017 0610   GLUCOSEU NEGATIVE 05/03/2017 0610   HGBUR TRACE (A) 05/03/2017 0610   BILIRUBINUR NEGATIVE 05/03/2017 0610   KETONESUR NEGATIVE 05/03/2017 0610   PROTEINUR NEGATIVE 05/03/2017 0610   UROBILINOGEN 0.2 12/24/2011 0805   NITRITE NEGATIVE 05/03/2017 0610   LEUKOCYTESUR NEGATIVE 05/03/2017 0610   Sepsis Labs: '@LABRCNTIP'$ (procalcitonin:4,lacticidven:4)  ) Recent Results (from the past 240 hour(s))  Resp panel by RT-PCR (RSV, Flu A&B, Covid) Anterior Nasal Swab     Status: None   Collection Time: 03/01/22  9:18 AM   Specimen: Anterior Nasal Swab  Result Value Ref Range Status   SARS Coronavirus 2 by RT PCR NEGATIVE NEGATIVE Final    Comment: (NOTE) SARS-CoV-2 target nucleic acids are NOT DETECTED.  The SARS-CoV-2 RNA is generally detectable in upper respiratory specimens during the acute phase of  infection. The lowest concentration of SARS-CoV-2 viral copies this assay can detect is 138 copies/mL. A negative result does not preclude SARS-Cov-2 infection and should not be used as the sole basis for treatment or other patient management decisions. A negative result may occur with  improper specimen collection/handling, submission of specimen other than nasopharyngeal swab, presence of viral mutation(s) within the areas targeted by this assay, and inadequate number of viral copies(<138 copies/mL). A negative result must be combined with clinical observations, patient history, and epidemiological information. The expected result is Negative.  Fact Sheet for Patients:  EntrepreneurPulse.com.au  Fact Sheet for Healthcare Providers:  IncredibleEmployment.be  This test is no t yet approved or cleared by the [place] FDA and  has been authorized for detection and/or diagnosis of SARS-CoV-2 by FDA under an Emergency Use Authorization (EUA). This EUA will remain  in effect (meaning this test can be used) for the duration of the COVID-19 declaration under Section 564(b)(1) of the Act, 21 U.S.C.section 360bbb-3(b)(1), unless the authorization is terminated  or revoked sooner.       Influenza A by PCR NEGATIVE NEGATIVE Final   Influenza B by PCR NEGATIVE NEGATIVE Final    Comment: (NOTE) The Xpert Xpress SARS-CoV-2/FLU/RSV plus assay is intended as an aid in the diagnosis of influenza from Nasopharyngeal swab specimens and should not be used as a sole basis for treatment. Nasal washings and aspirates are unacceptable for Xpert Xpress SARS-CoV-2/FLU/RSV testing.  Fact Sheet for Patients: EntrepreneurPulse.com.au  Fact Sheet for Healthcare Providers: IncredibleEmployment.be  This test is not yet approved or cleared by the [place] FDA and has been authorized for detection and/or diagnosis of SARS-CoV-2  by FDA under an Emergency Use Authorization (EUA). This EUA will remain in effect (meaning this test can be used) for the duration of the COVID-19 declaration under Section 564(b)(1) of the Act, 21 U.S.C. section 360bbb-3(b)(1), unless the authorization is terminated or revoked.     Resp Syncytial Virus by PCR NEGATIVE NEGATIVE Final    Comment: (NOTE) Fact Sheet for Patients: EntrepreneurPulse.com.au  Fact Sheet for Healthcare Providers: IncredibleEmployment.be  This test is not yet approved or cleared by the [place] FDA and has been authorized for detection and/or diagnosis of SARS-CoV-2 by FDA under an Emergency Use Authorization (EUA). This EUA will remain in effect (meaning this test can be used) for the duration of the COVID-19 declaration under Section 564(b)(1) of the Act, 21 U.S.C. section 360bbb-3(b)(1), unless the authorization is terminated or revoked.  Performed at [facility], [address] [place], [place] [address]   MRSA Next Gen by PCR, Nasal     Status: None   Collection Time: 03/01/22 11:02 PM  Result Value Ref Range Status   MRSA by PCR Next Gen NOT DETECTED NOT DETECTED Final    Comment: (NOTE) The GeneXpert MRSA Assay (FDA approved for NASAL specimens only), is one component of a comprehensive MRSA colonization surveillance program. It is not intended to diagnose MRSA infection nor to guide or monitor treatment for MRSA infections. Test performance is not FDA approved in patients less than 14 years old. Performed at [facility], [place] [address], [place], [place] [address]      Radiology Studies: ECHOCARDIOGRAM COMPLETE  Result Date: 03/02/2022    ECHOCARDIOGRAM REPORT   Patient Name:   SENAI HAVNER Date of Exam: 03/02/2022 Medical Rec #:  [id number]         Height:       70.0 in Accession #:    [id number]        Weight:       234.1 lb Date of Birth:  [birth year]          BSA:           2.232 m Patient Age:    39 years          BP:           136/67 mmHg Patient Gender: M                 HR:           61 bpm. Exam Location:  [facility] Procedure: 2D Echo, 3D Echo, Cardiac Doppler, Color Doppler and Strain Analysis Indications:    A-Fib  History:        Patient has prior history of Echocardiogram examinations, most                 recent 03/02/2022. CAD and Previous Myocardial Infarction; Risk  Factors:Dyslipidemia, Diabetes, Sleep Apnea and Former Smoker.  Sonographer:    Wilkie Aye RVT RCS Referring Phys: [id number] [place]  1. Left ventricular ejection fraction, by estimation, is 50%. The left ventricle has mildly decreased function. The left ventricle demonstrates global hypokinesis. There is moderate concentric left ventricular hypertrophy. Left ventricular diastolic parameters are consistent with Grade II diastolic dysfunction (pseudonormalization). Elevated left atrial pressure.  2. Right ventricular systolic function is mildly reduced. The right ventricular size is normal. There is mildly elevated pulmonary artery systolic pressure. The estimated right ventricular systolic pressure is Q000111Q mmHg.  3. Left atrial size was severely dilated.  4. The mitral valve is normal in structure. Trivial mitral valve regurgitation. No evidence of mitral stenosis. Moderate mitral annular calcification.  5. The aortic valve is tricuspid. There is mild calcification of the aortic valve. There is mild thickening of the aortic valve. Aortic valve regurgitation is not visualized. Aortic valve sclerosis/calcification is present, without any evidence of aortic stenosis.  6. The inferior vena cava is normal in size with greater than 50% respiratory variability, suggesting right atrial pressure of 3 mmHg. Comparison(s): No significant change from prior study. Prior images reviewed side by side. FINDINGS  Left Ventricle: Left ventricular ejection fraction, by estimation, is 50%. The  left ventricle has mildly decreased function. The left ventricle demonstrates global hypokinesis. 3D left ventricular ejection fraction analysis performed but not reported based on interpreter judgement due to suboptimal tracking. The left ventricular internal cavity size was normal in size. There is moderate concentric left ventricular hypertrophy. Left ventricular diastolic parameters are consistent with Grade II diastolic dysfunction (pseudonormalization). Elevated left atrial pressure. Right Ventricle: The right ventricular size is normal. No increase in right ventricular wall thickness. Right ventricular systolic function is mildly reduced. There is mildly elevated pulmonary artery systolic pressure. The tricuspid regurgitant velocity  is 2.85 m/s, and with an assumed right atrial pressure of 3 mmHg, the estimated right ventricular systolic pressure is Q000111Q mmHg. Left Atrium: Left atrial size was severely dilated. Right Atrium: Right atrial size was normal in size. Pericardium: There is no evidence of pericardial effusion. Mitral Valve: The mitral valve is normal in structure. Moderate mitral annular calcification. Trivial mitral valve regurgitation. No evidence of mitral valve stenosis. Tricuspid Valve: The tricuspid valve is normal in structure. Tricuspid valve regurgitation is trivial. Aortic Valve: The aortic valve is tricuspid. There is mild calcification of the aortic valve. There is mild thickening of the aortic valve. Aortic valve regurgitation is not visualized. Aortic valve sclerosis/calcification is present, without any evidence of aortic stenosis. Aortic valve mean gradient measures 7.3 mmHg. Aortic valve peak gradient measures 12.7 mmHg. Aortic valve area, by VTI measures 1.84 cm. Pulmonic Valve: The pulmonic valve was grossly normal. Pulmonic valve regurgitation is not visualized. Aorta: The aortic root and ascending aorta are structurally normal, with no evidence of dilitation. Venous: The  inferior vena cava is normal in size with greater than 50% respiratory variability, suggesting right atrial pressure of 3 mmHg. IAS/Shunts: No atrial level shunt detected by color flow Doppler.  LEFT VENTRICLE PLAX 2D LVIDd:         5.20 cm      Diastology LVIDs:         4.50 cm      LV e' medial:    5.57 cm/s LV PW:         1.50 cm      LV E/e' medial:  18.3 LV IVS:  1.30 cm      LV e' lateral:   5.46 cm/s LVOT diam:     2.20 cm      LV E/e' lateral: 18.7 LV SV:         69 LV SV Index:   31           2D Longitudinal Strain LVOT Area:     3.80 cm     2D Strain GLS Avg:     -11.8 %  LV Volumes (MOD) LV vol d, MOD A2C: 91.0 ml  3D Volume EF: LV vol d, MOD A4C: 162.0 ml 3D EF:        54 % LV vol s, MOD A2C: 54.5 ml  LV EDV:       128 ml LV vol s, MOD A4C: 86.9 ml  LV ESV:       59 ml LV SV MOD A2C:     36.5 ml  LV SV:        69 ml LV SV MOD A4C:     162.0 ml LV SV MOD BP:      55.9 ml RIGHT VENTRICLE            IVC RV Basal diam:  3.80 cm    IVC diam: 2.20 cm RV S prime:     9.51 cm/s TAPSE (M-mode): 1.5 cm LEFT ATRIUM              Index        RIGHT ATRIUM           Index LA diam:        5.40 cm  2.42 cm/m   RA Area:     19.50 cm LA Vol (A2C):   96.2 ml  43.10 ml/m  RA Volume:   48.20 ml  21.60 ml/m LA Vol (A4C):   124.0 ml 55.56 ml/m LA Biplane Vol: 113.0 ml 50.63 ml/m  AORTIC VALVE                     PULMONIC VALVE AV Area (Vmax):    1.99 cm      PV Vmax:       1.10 m/s AV Area (Vmean):   1.77 cm      PV Peak grad:  4.8 mmHg AV Area (VTI):     1.84 cm AV Vmax:           178.00 cm/s AV Vmean:          129.000 cm/s AV VTI:            0.374 m AV Peak Grad:      12.7 mmHg AV Mean Grad:      7.3 mmHg LVOT Vmax:         93.00 cm/s LVOT Vmean:        59.900 cm/s LVOT VTI:          0.181 m LVOT/AV VTI ratio: 0.49  AORTA Ao Root diam: 2.90 cm Ao Asc diam:  3.10 cm Ao Arch diam: 2.6 cm MITRAL VALVE                TRICUSPID VALVE MV Area (PHT): 4.47 cm     TR Peak grad:   32.5 mmHg MV Decel Time: 170 msec      TR Vmax:        285.00 cm/s MV E velocity: 102.10 cm/s MV A velocity: 40.80 cm/s   SHUNTS MV E/A ratio:  2.50  Systemic VTI:  0.18 m                             Systemic Diam: 2.20 cm Mihai Croitoru MD Electronically signed by Sanda Klein MD Signature Date/Time: 03/02/2022/3:17:58 PM    Final      Scheduled Meds:  azithromycin  500 mg Oral Daily   carvedilol  25 mg Oral BID   clopidogrel  75 mg Oral Daily   empagliflozin  10 mg Oral Daily   furosemide  40 mg Intravenous Daily   insulin aspart  0-15 Units Subcutaneous TID WC   levothyroxine  137 mcg Oral QAC breakfast   losartan  25 mg Oral Daily   pantoprazole  40 mg Oral Daily   predniSONE  5 mg Oral Q breakfast   spironolactone  12.5 mg Oral Daily   venlafaxine XR  150 mg Oral Daily   Warfarin - Pharmacist Dosing Inpatient   Does not apply q1600   Continuous Infusions:  cefTRIAXone (ROCEPHIN)  IV Stopped (03/03/22 2124)     LOS: 3 days    Time spent: 26mn    PDomenic Polite MD [facility]   03/04/2022, 11:18 AM

## 2022-03-04 NOTE — Progress Notes (Signed)
Mobility Specialist - Progress Note   03/04/22 1000  Mobility  Activity Ambulated with assistance in hallway  Level of Assistance Contact guard assist, steadying assist  Assistive Device None  Distance Ambulated (ft) 400 ft  Activity Response Tolerated well  Mobility Referral Yes  $Mobility charge 1 Mobility    Pt received in recliner and agreeable. Took standing break x2 in hallway, tolerated increased distance well. Left in recliner w/ call bell in reach and all needs met.   [place] Specialist Please contact via SecureChat or Rehab office at [phone]

## 2022-03-04 NOTE — Care Management Important Message (Signed)
Important Message  Patient Details  Name: Gary [name] MRN: [id number] Date of Birth: [birth year]   Medicare Important Message Given:  Yes     Shelda Altes 03/04/2022, 9:00 AM

## 2022-03-05 DIAGNOSIS — J9601 Acute respiratory failure with hypoxia: Secondary | ICD-10-CM | POA: Diagnosis not present

## 2022-03-05 LAB — BASIC METABOLIC PANEL
Anion gap: 13 (ref 5–15)
BUN: 22 mg/dL (ref 8–23)
CO2: 25 mmol/L (ref 22–32)
Calcium: 9 mg/dL (ref 8.9–10.3)
Chloride: 101 mmol/L (ref 98–111)
Creatinine, Ser: 1.08 mg/dL (ref 0.61–1.24)
GFR, Estimated: >60 mL/min (ref >60)
Glucose, Bld: 108 mg/dL — ABNORMAL HIGH (ref 70–99)
Potassium: 3.7 mmol/L (ref 3.5–5.1)
Sodium: 139 mmol/L (ref 135–145)

## 2022-03-05 LAB — CBC
HCT: 33.9 % — ABNORMAL LOW (ref 39.0–52.0)
Hemoglobin: 10.2 g/dL — ABNORMAL LOW (ref 13.0–17.0)
MCH: 23 pg — ABNORMAL LOW (ref 26.0–34.0)
MCHC: 30.1 g/dL (ref 30.0–36.0)
MCV: 76.4 fL — ABNORMAL LOW (ref 80.0–100.0)
Platelets: 457 10*3/uL — ABNORMAL HIGH (ref 150–400)
RBC: 4.44 MIL/uL (ref 4.22–5.81)
RDW: 17.9 % — ABNORMAL HIGH (ref 11.5–15.5)
WBC: 11.1 10*3/uL — ABNORMAL HIGH (ref 4.0–10.5)
nRBC: 0 % (ref 0.0–0.2)

## 2022-03-05 LAB — GLUCOSE, CAPILLARY
Glucose-Capillary: 105 mg/dL — ABNORMAL HIGH (ref 70–99)
Glucose-Capillary: 114 mg/dL — ABNORMAL HIGH (ref 70–99)
Glucose-Capillary: 104 mg/dL — ABNORMAL HIGH (ref 70–99)
Glucose-Capillary: 118 mg/dL — ABNORMAL HIGH (ref 70–99)

## 2022-03-05 LAB — PROTIME-INR
INR: 2 — ABNORMAL HIGH (ref 0.8–1.2)
Prothrombin Time: 22.1 seconds — ABNORMAL HIGH (ref 11.4–15.2)

## 2022-03-05 MED ORDER — WARFARIN SODIUM 2.5 MG PO TABS
2.5000 mg | ORAL_TABLET | Freq: Once | ORAL | Status: AC
  Administered 2022-03-05: 2.5 mg via ORAL
  Filled 2022-03-05: qty 1

## 2022-03-05 MED ORDER — FUROSEMIDE 20 MG PO TABS
20.0000 mg | ORAL_TABLET | Freq: Every day | ORAL | Status: DC
  Administered 2022-03-05: 20 mg via ORAL
  Filled 2022-03-05 (×3): qty 1

## 2022-03-05 MED ORDER — CEFDINIR 300 MG PO CAPS
300.0000 mg | ORAL_CAPSULE | Freq: Two times a day (BID) | ORAL | Status: DC
  Administered 2022-03-05 – 2022-03-06 (×3): 300 mg via ORAL
  Filled 2022-03-05 (×3): qty 1

## 2022-03-05 NOTE — Progress Notes (Signed)
PROGRESS NOTE    Gary [name]  [id number] DOB: October 28, 1945 DOA: 03/01/2022 PCP: [name], DO  77/M w/ PAfib, CAD chronic diastolic CHF, Wegner's granulomatosis presented with dyspnea.  Patient had DCCV on 02/26/22. Reports dyspnea on exertion after procedure, along with 02 desaturation down to high 80's. Labs creat 1,24, BNP 471, troponin 23 and 23  CXR w/cardiomegaly with bilateral interstitial markings CT chest with bilateral bronchopneumonia -on antibiotic therapy for pneumonia.   Subjective: -Still feels weak, some dyspnea with activity, ambulating in the halls, cough is improving  Assessment and Plan:  Aspiration pneumonia Acute hypoxemic respiratory failure.  CXR w/ bilateral pulmonary infiltrates.,  Confirmed on CT -Suspect aspiration in the background of recent DCCV, no history of dysphagia -Treated with IV ceftriaxone and oral azithromycin.  -Weaned off O2, switch to oral cefdinir, azithromycin today -Increase activity as tolerated, discharge planning  Acute on chronic diastolic CHF (congestive heart failure) (HCC) -Echo with EF [id number], grade 2 diastolic dysfunction, global hypokinesis, moderate LVH, RV systolic function with mild reduction -Diuresed with IV Lasix yesterday, switch to oral diuretics, continue Jardiance and Aldactone, continue carvedilol, losartan  -BMP in a.m.  CAD (coronary artery disease) -Stable, no chest pain  P.Atrial fibrillation (HCC) Continue  carvedilol and warfarin.  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  Continue home dose of prednisone.   Anemia of chronic disease, cell count has been stable.   DVT prophylaxis: Warfarin Code Status: Full Code Family Communication: None present Disposition Plan: Home l  likely tomorrow  Consultants:    Procedures:   Antimicrobials:    Objective: Vitals:   03/04/22 1647 03/04/22 2009 03/05/22 0108 03/05/22 0512  BP: 125/65 (!) 121/58 (!) 120/53 119/71  Pulse: (!) 54 68 75 (!) 59  Resp: 18     Temp: 98.1 F (36.7 C) 98.1 F (36.7 C) 98.7 F (37.1 C)   TempSrc: Oral Oral Oral   SpO2: 93%  96% 98%  Weight:   107.1 kg   Height:        Intake/Output Summary (Last 24 hours) at 03/05/2022 1240 Last data filed at 03/04/2022 1825 Gross per 24 hour  Intake 480 ml  Output --  Net 480 ml   Filed Weights   03/03/22 0515 03/04/22 0017 03/05/22 0108  Weight: 105.8 kg 107 kg 107.1 kg    Examination: Elderly chronically ill male sitting up in recliner, AAOx3, no distress HEENT: No JVD CVS: S1-S2, regular rhythm Lungs: Few basilar rales Abdomen: Soft, nontender, bowel sounds present Extremities: No edema Skin: no new rashes on exposed skin   Data Reviewed:   CBC: Recent Labs  Lab 03/01/22 0945 03/02/22 0039 03/03/22 0034 03/04/22 0033 03/05/22 0123  WBC 13.4* 11.5* 12.5* 11.7* 11.1*  NEUTROABS 10.6*  --   --   --   --   HGB 10.7* 9.7* 9.6* 10.3* 10.2*  HCT 34.7* 30.7* 30.9* 33.8* 33.9*  MCV 74.9* 74.3* 75.7* 75.4* 76.4*  PLT 366 365 366 439* [id number]*   Basic Metabolic Panel: Recent Labs  Lab 03/01/22 0945 03/02/22 0039 03/03/22 0034 03/05/22 0123  NA 142 139 140 139  K 3.8 3.3* 4.4 3.7  CL 103 100 105 101  CO2 '27 26 25 25  '$ GLUCOSE 106* 106* 123* 108*  BUN '21 19 20 '$ 22  CREATININE 1.24 1.11 1.02 1.08  CALCIUM 9.9 8.8* 8.8* 9.0   GFR: Estimated Creatinine Clearance: 69 mL/min (by C-G formula based on SCr of 1.08 mg/dL). Liver Function Tests: Recent Labs  Lab 03/01/22 0945 03/02/22 0039  AST 26 29  ALT 28 30  ALKPHOS 92 93  BILITOT 0.6 0.8  PROT 7.4 6.2*  ALBUMIN 4.1 2.7*   No results for input(s): "LIPASE", "AMYLASE" in the last 168 hours. No results for input(s): "AMMONIA" in the last 168 hours. Coagulation  Profile: Recent Labs  Lab 03/01/22 0945 03/02/22 0039 03/03/22 0034 03/04/22 0033 03/05/22 0123  INR 3.3* 2.8* 2.1* 1.7* 2.0*   Cardiac Enzymes: No results for input(s): "CKTOTAL", "CKMB", "CKMBINDEX", "TROPONINI" in the last 168 hours. BNP (last 3 results) No results for input(s): "PROBNP" in the last 8760 hours. HbA1C: No results for input(s): "HGBA1C" in the last 72 hours.  CBG: Recent Labs  Lab 03/04/22 1115 03/04/22 1645 03/04/22 2116 03/05/22 0551 03/05/22 1202  GLUCAP 123* 99 112* 105* 114*   Lipid Profile: No results for input(s): "CHOL", "HDL", "LDLCALC", "TRIG", "CHOLHDL", "LDLDIRECT" in the last 72 hours. Thyroid Function Tests: No results for input(s): "TSH", "T4TOTAL", "FREET4", "T3FREE", "THYROIDAB" in the last 72 hours. Anemia Panel: No results for input(s): "VITAMINB12", "FOLATE", "FERRITIN", "TIBC", "IRON", "RETICCTPCT" in the last 72 hours. Urine analysis:    Component Value Date/Time   COLORURINE YELLOW 05/03/2017 0610   APPEARANCEUR CLEAR 05/03/2017 0610   LABSPEC 1.020 05/03/2017 0610   PHURINE 6.0 05/03/2017 0610   GLUCOSEU NEGATIVE 05/03/2017 0610   HGBUR TRACE (A) 05/03/2017 0610   BILIRUBINUR NEGATIVE 05/03/2017 0610   KETONESUR NEGATIVE 05/03/2017 0610   PROTEINUR NEGATIVE 05/03/2017 0610   UROBILINOGEN 0.2 12/24/2011 0805   NITRITE NEGATIVE 05/03/2017 0610   LEUKOCYTESUR NEGATIVE 05/03/2017 0610   Sepsis Labs: '@LABRCNTIP'$ (procalcitonin:4,lacticidven:4)  ) Recent Results (from the past 240 hour(s))  Resp panel by RT-PCR (RSV, Flu A&B, Covid) Anterior Nasal Swab     Status: None   Collection Time: 03/01/22  9:18 AM   Specimen: Anterior Nasal Swab  Result Value Ref Range Status   SARS Coronavirus 2 by RT PCR NEGATIVE NEGATIVE Final    Comment: (NOTE) SARS-CoV-2 target nucleic acids are NOT DETECTED.  The SARS-CoV-2 RNA is generally detectable in upper respiratory specimens during the acute phase of infection. The  lowest concentration of SARS-CoV-2 viral copies this assay can detect is 138 copies/mL. A negative result does not preclude SARS-Cov-2 infection and should not be used as the sole basis for treatment or other patient management decisions. A negative result may occur with  improper specimen collection/handling, submission of specimen other than nasopharyngeal swab, presence of viral mutation(s) within the areas targeted by this assay, and inadequate number of viral copies(<138 copies/mL). A negative result must be combined with clinical observations, patient history, and epidemiological information. The expected result is Negative.  Fact Sheet for Patients:  EntrepreneurPulse.com.au  Fact Sheet for Healthcare Providers:  IncredibleEmployment.be  This test is no t yet approved or cleared by the [place] FDA and  has been authorized for detection and/or diagnosis of SARS-CoV-2 by FDA under an Emergency Use Authorization (EUA). This EUA will remain  in effect (meaning this test can be used) for the duration of the COVID-19 declaration under Section 564(b)(1) of the Act, 21 U.S.C.section 360bbb-3(b)(1), unless the authorization is terminated  or revoked sooner.       Influenza A by PCR NEGATIVE NEGATIVE Final   Influenza B by PCR  NEGATIVE NEGATIVE Final    Comment: (NOTE) The Xpert Xpress SARS-CoV-2/FLU/RSV plus assay is intended as an aid in the diagnosis of influenza from Nasopharyngeal swab specimens and should not be used as a sole basis for treatment. Nasal washings and aspirates are unacceptable for Xpert Xpress SARS-CoV-2/FLU/RSV testing.  Fact Sheet for Patients: EntrepreneurPulse.com.au  Fact Sheet for Healthcare Providers: IncredibleEmployment.be  This test is not yet approved or cleared by the [place] FDA and has been authorized for detection and/or diagnosis of SARS-CoV-2 by FDA under  an Emergency Use Authorization (EUA). This EUA will remain in effect (meaning this test can be used) for the duration of the COVID-19 declaration under Section 564(b)(1) of the Act, 21 U.S.C. section 360bbb-3(b)(1), unless the authorization is terminated or revoked.     Resp Syncytial Virus by PCR NEGATIVE NEGATIVE Final    Comment: (NOTE) Fact Sheet for Patients: EntrepreneurPulse.com.au  Fact Sheet for Healthcare Providers: IncredibleEmployment.be  This test is not yet approved or cleared by the [place] FDA and has been authorized for detection and/or diagnosis of SARS-CoV-2 by FDA under an Emergency Use Authorization (EUA). This EUA will remain in effect (meaning this test can be used) for the duration of the COVID-19 declaration under Section 564(b)(1) of the Act, 21 U.S.C. section 360bbb-3(b)(1), unless the authorization is terminated or revoked.  Performed at [facility], [address], [place], [place] [address]   MRSA Next Gen by PCR, Nasal     Status: None   Collection Time: 03/01/22 11:02 PM  Result Value Ref Range Status   MRSA by PCR Next Gen NOT DETECTED NOT DETECTED Final    Comment: (NOTE) The GeneXpert MRSA Assay (FDA approved for NASAL specimens only), is one component of a comprehensive MRSA colonization surveillance program. It is not intended to diagnose MRSA infection nor to guide or monitor treatment for MRSA infections. Test performance is not FDA approved in patients less than 42 years old. Performed at [facility], [place] [address] [place], [place] [address]      Radiology Studies: No results found.   Scheduled Meds:  azithromycin  500 mg Oral Daily   carvedilol  25 mg Oral BID   cefdinir  300 mg Oral Q12H   clopidogrel  75 mg Oral Daily   empagliflozin  10 mg Oral Daily   insulin aspart  0-15 Units Subcutaneous TID WC   levothyroxine  137 mcg Oral QAC breakfast    losartan  25 mg Oral Daily   pantoprazole  40 mg Oral Daily   predniSONE  5 mg Oral Q breakfast   spironolactone  12.5 mg Oral Daily   venlafaxine XR  150 mg Oral Daily   warfarin  2.5 mg Oral ONCE-1600   Warfarin - Pharmacist Dosing Inpatient   Does not apply q1600   Continuous Infusions:     LOS: 4 days    Time spent: 50mn    PDomenic Polite MD [facility]   03/05/2022, 12:40 PM

## 2022-03-05 NOTE — TOC Progression Note (Signed)
Transition of Care [facility]) - Progression Note    Patient Details  Name: Gary [name] MRN: [id number] Date of Birth: [birth year]  Transition of [facility] CM/SW Contact  Zenon Mayo, RN Phone Number: 03/05/2022, 4:18 PM  Clinical Narrative:    Patient may need home oxygen when he goes home, if he does he would like to use [employer] , the small portable oxygen If he needs it. Contact information is [address] [phone].     Expected Discharge Plan: Home/Self Care Barriers to Discharge: Continued Medical Work up  Expected Discharge Plan and Services In-house Referral: NA Discharge Planning Services: CM Consult Post Acute Care Choice: NA Living arrangements for the past 2 months: Single Family Home                 DME Arranged: N/A DME Agency: NA       HH Arranged: NA           Social Determinants of Health (SDOH) Interventions SDOH Screenings   Food Insecurity: No Food Insecurity (03/02/2022)  Housing: Low Risk  (03/02/2022)  Transportation Needs: No Transportation Needs (03/02/2022)  Utilities: Not At Risk (03/02/2022)  Tobacco Use: Medium Risk (03/01/2022)    Readmission Risk Interventions    03/02/2022    4:16 PM  Readmission Risk Prevention Plan  Transportation Screening Complete  HRI or Home Care Consult Complete  Palliative Care Screening Not Applicable  Medication Review (RN Care Manager) Complete

## 2022-03-05 NOTE — Progress Notes (Signed)
ANTICOAGULATION CONSULT NOTE - Follow Up Consult  Pharmacy Consult for warfarin Indication: atrial fibrillation  Allergies  Allergen Reactions   Statins Other (See Comments)    Muscle pain    Patient Measurements: Height: '5\' 10"'$  (177.8 cm) Weight: 107.1 kg (236 lb 1.8 oz) IBW/kg (Calculated) : 73  Vital Signs: Temp: 98.7 F (37.1 C) (03/01 0108) Temp Source: Oral (03/01 0108) BP: 119/71 (03/01 0512) Pulse Rate: 59 (03/01 0512)  Labs: Recent Labs    03/03/22 0034 03/04/22 0033 03/05/22 0123  HGB 9.6* 10.3* 10.2*  HCT 30.9* 33.8* 33.9*  PLT 366 439* 457*  LABPROT 23.0* 20.0* 22.1*  INR 2.1* 1.7* 2.0*  CREATININE 1.02  --  1.08     Estimated Creatinine Clearance: 69 mL/min (by C-G formula based on SCr of 1.08 mg/dL).  Assessment: 67 YOM on warfarin PTA for atrial fibrillation who presented with ongoing SOB following recent cardioversion on 2/23. Pharmacy consulted to manage warfarin.   INR supratherapeutic (3.3) on admit and warfarin held 2/26. INR down to 2.8 on 2/27 but warfarin dose missed. INR low (1/7) on 2/29> back into low target range (2.0) after Warfarin 5 mg x 2 days. Continues Ceftriaxone and Azithromycin for CAP, and off daily Septra DS for prophylaxis. Azithromycin and Septra DS can effect INR.  PTA warfarin regimen confirmed by [facility] clinic note 02/25/22:   2.5 mg MWF and 5 mg TTSS. INR 2.7 on 02/25/22.  Goal of Therapy:  INR 2-3 Monitor platelets by anticoagulation protocol: Yes   Plan:  Warfarin 2.5 mg x 1 today, usual Friday dose. Daily PT/INR for now. Monitor dosing needs while off Septra DS and on Azithromycin.  Arty Baumgartner, RPh 03/05/2022,9:39 AM

## 2022-03-05 NOTE — Progress Notes (Signed)
Pt stable and tolerating 1L [place] with no distress noted. Pt refused CPAP QHS and will sleep with [place]. Will continue to monitor for any additional changes

## 2022-03-06 DIAGNOSIS — J189 Pneumonia, unspecified organism: Secondary | ICD-10-CM | POA: Diagnosis not present

## 2022-03-06 DIAGNOSIS — J9601 Acute respiratory failure with hypoxia: Secondary | ICD-10-CM | POA: Diagnosis not present

## 2022-03-06 LAB — CBC
HCT: 34.3 % — ABNORMAL LOW (ref 39.0–52.0)
Hemoglobin: 10.2 g/dL — ABNORMAL LOW (ref 13.0–17.0)
MCH: 22.8 pg — ABNORMAL LOW (ref 26.0–34.0)
MCHC: 29.7 g/dL — ABNORMAL LOW (ref 30.0–36.0)
MCV: 76.7 fL — ABNORMAL LOW (ref 80.0–100.0)
Platelets: 466 10*3/uL — ABNORMAL HIGH (ref 150–400)
RBC: 4.47 MIL/uL (ref 4.22–5.81)
RDW: 18.1 % — ABNORMAL HIGH (ref 11.5–15.5)
WBC: 10.2 10*3/uL (ref 4.0–10.5)
nRBC: 0 % (ref 0.0–0.2)

## 2022-03-06 LAB — GLUCOSE, CAPILLARY
Glucose-Capillary: 108 mg/dL — ABNORMAL HIGH (ref 70–99)
Glucose-Capillary: 107 mg/dL — ABNORMAL HIGH (ref 70–99)

## 2022-03-06 LAB — BASIC METABOLIC PANEL
Anion gap: 7 (ref 5–15)
BUN: 20 mg/dL (ref 8–23)
CO2: 28 mmol/L (ref 22–32)
Calcium: 8.6 mg/dL — ABNORMAL LOW (ref 8.9–10.3)
Chloride: 104 mmol/L (ref 98–111)
Creatinine, Ser: 1.13 mg/dL (ref 0.61–1.24)
GFR, Estimated: >60 mL/min (ref >60)
Glucose, Bld: 112 mg/dL — ABNORMAL HIGH (ref 70–99)
Potassium: 3.8 mmol/L (ref 3.5–5.1)
Sodium: 139 mmol/L (ref 135–145)

## 2022-03-06 LAB — PROTIME-INR
INR: 2.1 — ABNORMAL HIGH (ref 0.8–1.2)
Prothrombin Time: 23.5 seconds — ABNORMAL HIGH (ref 11.4–15.2)

## 2022-03-06 MED ORDER — CARVEDILOL 12.5 MG PO TABS
12.5000 mg | ORAL_TABLET | Freq: Two times a day (BID) | ORAL | Status: DC
  Filled 2022-03-06: qty 1

## 2022-03-06 MED ORDER — CARVEDILOL 25 MG PO TABS: 12.5000 mg | ORAL_TABLET | Freq: Two times a day (BID) | ORAL | Status: DC

## 2022-03-06 MED ORDER — CEFDINIR 300 MG PO CAPS: 300.0000 mg | ORAL_CAPSULE | Freq: Two times a day (BID) | ORAL | 0 refills | Status: AC

## 2022-03-06 MED ORDER — CARVEDILOL 12.5 MG PO TABS
12.5000 mg | ORAL_TABLET | Freq: Two times a day (BID) | ORAL | Status: DC
  Administered 2022-03-06: 12.5 mg via ORAL
  Filled 2022-03-06 (×2): qty 1

## 2022-03-06 MED ORDER — SULFAMETHOXAZOLE-TRIMETHOPRIM 400-80 MG PO TABS: 1.0000 | ORAL_TABLET | Freq: Every day | ORAL | Status: DC

## 2022-03-06 MED ORDER — WARFARIN SODIUM 5 MG PO TABS
5.0000 mg | ORAL_TABLET | Freq: Once | ORAL | Status: AC
  Administered 2022-03-06: 5 mg via ORAL
  Filled 2022-03-06: qty 1

## 2022-03-06 MED ORDER — SPIRONOLACTONE 25 MG PO TABS: 12.5000 mg | ORAL_TABLET | Freq: Every day | ORAL | 0 refills | Status: DC

## 2022-03-06 MED ORDER — CARVEDILOL 12.5 MG PO TABS: 12.5000 mg | ORAL_TABLET | Freq: Two times a day (BID) | ORAL | Status: DC

## 2022-03-06 NOTE — Progress Notes (Signed)
SATURATION QUALIFICATIONS: (This note is used to comply with regulatory documentation for home oxygen)  Patient Saturations on Room Air at Rest = 94 %  Patient Saturations on Room Air while Ambulating = 86%  Patient Saturations on 2 Liters of oxygen while Ambulating = 96%  Please briefly explain why patient needs home oxygen:  Pt dyspneic upon exertion--unable to ambulate a prolonged distance (more than 200 feet) without SpO2 sats dropping. Requires home O2 in order to maintain SpO2 sats safely.

## 2022-03-06 NOTE — Progress Notes (Signed)
ANTICOAGULATION CONSULT NOTE - Follow Up Consult  Pharmacy Consult for warfarin Indication: atrial fibrillation  Allergies  Allergen Reactions   Statins Other (See Comments)    Muscle pain    Patient Measurements: Height: '5\' 10"'$  (177.8 cm) Weight: 107.1 kg (236 lb 1.8 oz) IBW/kg (Calculated) : 73  Vital Signs: Temp: 97.9 F (36.6 C) (03/02 0838) Temp Source: Oral (03/02 0838) BP: 98/60 (03/02 0909) Pulse Rate: 85 (03/02 0838)  Labs: Recent Labs    03/04/22 0033 03/05/22 0123 03/06/22 0139  HGB 10.3* 10.2* 10.2*  HCT 33.8* 33.9* 34.3*  PLT 439* 457* 466*  LABPROT 20.0* 22.1* 23.5*  INR 1.7* 2.0* 2.1*  CREATININE  --  1.08 1.13     Estimated Creatinine Clearance: 66 mL/min (by C-G formula based on SCr of 1.13 mg/dL).  Assessment: 35 YOM on warfarin PTA for atrial fibrillation who presented with ongoing SOB following recent cardioversion on 2/23. Pharmacy consulted to manage warfarin.  INR supratherapeutic (3.3) on admit and warfarin held 2/26. INR down to 2.8 on 2/27 but warfarin dose missed. INR low (1/7) on 2/29> back into low target range (2.0) after Warfarin 5 mg x 2 days. Continues Cefdinir and Azithromycin for CAP, and off daily Septra DS for prophylaxis. Azithromycin and Septra DS can effect INR. PTA warfarin regimen confirmed by [facility] clinic note 02/25/22:   2.5 mg MWF and 5 mg TTSS. INR 2.7 on 02/25/22.  3/2 - INR 2.1 therapeutic. Hgb 10.2 stable, PLT 466.   Goal of Therapy:  INR 2-3 Monitor platelets by anticoagulation protocol: Yes   Plan:  Warfarin 5 mg x 1 today, usual Saturday dose Daily PT/INR for now. Monitor dosing needs while off Septra DS and on Azithromycin.  Eliseo Gum, PharmD PGY1 Pharmacy Resident   03/06/2022  10:02 AM

## 2022-03-06 NOTE — Progress Notes (Signed)
Approximately 1550--Pt discharged to home. At time of discharge, pt A&O x 4 and VSS. No complaints of pain or discomfort at this time. AVS paperwork and discharged instructions provided to pt and pt's family (son and wife) at bedside. All questions answered at this time. Teach- back utilized. Pt left with all belongings, including portable O2 concentrator. Pt verbalized and demonstrated understanding of supplemental oxygen. All PIVs removed. Telemetry notified.

## 2022-03-06 NOTE — Progress Notes (Signed)
Pt ambulated in hallway for walking SpO2 measurement. Pt only able to ambulate approximately 287f before O2 dropped to 86% on RA. Pt returned to room and placed on 2L [place]--pt SpO2 recovered to approximately 96%. Pt dyspneic on exertion. MD PDomenic Politenotified.

## 2022-03-06 NOTE — Discharge Summary (Signed)
Physician Discharge Summary  Gary [name] [id number] DOB: 13-May-1945 DOA: 03/01/2022  PCP: [name], DO  Admit date: 03/01/2022 Discharge date: 03/06/2022  Time spent: 45 minutes  Recommendations for Outpatient Follow-up:  PCP in 1 week, please repeat chest x-ray in 6 to 8 weeks Please check BMP in 1 week, resume antihypertensives if blood pressure starts trending up again   Discharge Diagnoses:  Active Problems: Aspiration pneumonia   Chronic diastolic CHF (congestive heart failure) (HCC)   CAD (coronary artery disease)   Atrial fibrillation (HCC)   Hypokalemia   Type 2 diabetes mellitus with hyperlipidemia (HCC)   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HCC)   Class 1 obesity   Discharge Condition: Improved  Diet recommendation: Low-sodium, diabetic  Filed Weights   03/03/22 0515 03/04/22 0017 03/05/22 0108  Weight: 105.8 kg 107 kg 107.1 kg    History of present illness:  77/M w/ PAfib, CAD chronic diastolic CHF, Wegner's granulomatosis presented with dyspnea.  Patient had DCCV on 02/26/22. Reports dyspnea on exertion after procedure, along with 02 desaturation down to high 80's. Labs creat 1,24, BNP 471, troponin 23 and 23  CXR w/cardiomegaly with bilateral interstitial markings CT chest with bilateral bronchopneumonia -on antibiotic therapy for pneumonia.     Hospital Course:   Aspiration pneumonia Acute hypoxemic respiratory failure.  CXR w/ bilateral pulmonary infiltrates.,  Confirmed on CT -Suspect aspiration in the background of recent DCCV, no history of dysphagia -Treated with IV ceftriaxone and oral azithromycin.  Then transitioned to oral cefdinir and azithromycin, clinically improving, weaned off oxygen at rest however did desat with activity set up with oxygen for a few weeks at discharge -Continue cefdinir for 4 more days, needs follow-up chest x-ray in 6 to 8 weeks   Acute on chronic diastolic CHF (congestive heart failure) (HCC) -Echo with EF  [id number], grade 2 diastolic dysfunction, global hypokinesis, moderate LVH, RV systolic function with mild reduction -Was also volume overloaded briefly on admission, diuresed with IV Lasix, started on Aldactone, subsequently blood pressures were soft, losartan and HCTZ discontinued, -Coreg and Aldactone continued at discharge, resume losartan at follow-up if blood pressure trends up further, check BMP in 1 week   CAD (coronary artery disease) -Stable, no chest pain   P.Atrial fibrillation (HCC) Continue  carvedilol and warfarin.  -Recent DCCV 2/23, in sinus rhythm at this time   Hypokalemia -replaced   Type 2 diabetes mellitus with hyperlipidemia (HCC) -Stable, continue metformin   Class 1 obesity Calculated BMI is 33,5    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[place]) -Continued on home dose of prednisone, gets rituximab infusions as outpatient -Resume Bactrim for prophylaxis after cefdinir course completed -Follow-up with rheumatology.    Anemia of chronic disease, cell count has been stable.     Discharge Exam: Vitals:   03/06/22 0900 03/06/22 0909  BP: (!) 91/55 98/60  Pulse:    Resp:    Temp:    SpO2:     Gen: Awake, Alert, Oriented X 3,  HEENT: no JVD Lungs: Good air movement bilaterally, CTAB CVS: S1S2/RRR Abd: soft, Non tender, non distended, BS present Extremities: No edema Skin: no new rashes on exposed skin   Discharge Instructions   Discharge Instructions     Diet - low sodium heart healthy   Complete by: As directed    Increase activity slowly   Complete by: As directed       Allergies as of 03/06/2022       Reactions   Statins Other (See Comments)  Muscle pain        Medication List     STOP taking these medications    hydrochlorothiazide 12.5 MG tablet Commonly known as: HYDRODIURIL   losartan 100 MG tablet Commonly known as: COZAAR       TAKE these medications    ALPRAZolam 0.5 MG tablet Commonly known as: XANAX Take 0.5 mg by  mouth at bedtime.   calcium carbonate 600 MG Tabs tablet Commonly known as: OS-CAL Take 600 mg by mouth daily with breakfast.   carvedilol 25 MG tablet Commonly known as: COREG Take 0.5 tablets (12.5 mg total) by mouth 2 (two) times daily. What changed: how much to take   cefdinir 300 MG capsule Commonly known as: OMNICEF Take 1 capsule (300 mg total) by mouth every 12 (twelve) hours for 4 days.   clopidogrel 75 MG tablet Commonly known as: PLAVIX Take 1 tablet (75 mg total) by mouth daily with breakfast. Take every day for 1 year   CO Q 10 PO Take 400 mg by mouth daily.   levothyroxine 137 MCG tablet Commonly known as: SYNTHROID Take 137 mcg by mouth daily before breakfast.   loratadine 10 MG tablet Commonly known as: CLARITIN Take 10 mg by mouth daily.   metFORMIN 500 MG tablet Commonly known as: GLUCOPHAGE Take 500 mg by mouth 2 (two) times daily.   multivitamin with minerals Tabs tablet Take 1 tablet by mouth daily. Centrum silver men   nitroGLYCERIN 0.4 MG SL tablet Commonly known as: NITROSTAT Place 1 tablet (0.4 mg total) under the tongue every 5 (five) minutes x 3 doses as needed for chest pain.   Ozempic (0.25 or 0.5 MG/DOSE) 2 MG/3ML Sopn Generic drug: Semaglutide(0.25 or 0.'5MG'$ /DOS) Inject 5 mg into the skin See admin instructions. Every two weeks   pantoprazole 40 MG tablet Commonly known as: PROTONIX Take 40 mg by mouth 2 (two) times daily.   predniSONE 5 MG tablet Commonly known as: DELTASONE Take 5 mg by mouth daily with breakfast.   Repatha SureClick [id number] MG/ML Soaj Generic drug: Evolocumab INJECT 140 MG INTO THE SKIN EVERY 14 (FOURTEEN) DAYS. INJECT 1 PEN INTO THE FATTY TISSUE SKIN EVERY 14 DAY   RITUXAN IV Inject 1 application  into the vein See admin instructions. Take every two weeks and every six months alternating   spironolactone 25 MG tablet Commonly known as: ALDACTONE Take 0.5 tablets (12.5 mg total) by mouth daily. Start taking  on: March 07, 2022   sulfamethoxazole-trimethoprim 400-80 MG tablet Commonly known as: BACTRIM Take 1 tablet by mouth daily. Restart after antibiotic for pneumonia completed Start taking on: March 10, 2022 What changed:  additional instructions These instructions start on March 10, 2022. If you are unsure what to do until then, ask your doctor or other care provider.   testosterone cypionate 100 MG/ML injection Commonly known as: DEPOTESTOTERONE CYPIONATE Inject 100 mg into the muscle every 14 (fourteen) days.   venlafaxine XR 150 MG 24 hr capsule Commonly known as: EFFEXOR-XR Take 150 mg by mouth daily.   Vitamin D3 125 MCG (5000 UT) Tabs Generic drug: Cholecalciferol Take 10,000 Units by mouth daily.   warfarin 5 MG tablet Commonly known as: COUMADIN Take as directed. If you are unsure how to take this medication, talk to your nurse or doctor. Original instructions: TAKE 1/2 TABLET TO 1 TABLET BY MOUTH DAILY AS DIRECTED BY COUMADIN CLINIC What changed:  how much to take how to take this when to take this additional  instructions   Zinc Chelated 22.5 MG Tabs Take 22.5 mg by mouth daily.       Allergies  Allergen Reactions   Statins Other (See Comments)    Muscle pain      The results of significant diagnostics from this hospitalization (including imaging, microbiology, ancillary and laboratory) are listed below for reference.    Significant Diagnostic Studies: ECHOCARDIOGRAM COMPLETE  Result Date: 03/02/2022    ECHOCARDIOGRAM REPORT   Patient Name:   HARWOOD DINSMOOR [facility] Date of Exam: 03/02/2022 Medical Rec #:  [id number]         Height:       70.0 in Accession #:    [id number]        Weight:       234.1 lb Date of Birth:  1945/09/12          BSA:          2.232 m Patient Age:    37 years          BP:           136/67 mmHg Patient Gender: M                 HR:           61 bpm. Exam Location:  [place] Procedure: 2D Echo, 3D Echo, Cardiac Doppler, Color Doppler and Strain  Analysis Indications:    A-Fib  History:        Patient has prior history of Echocardiogram examinations, most                 recent 03/02/2022. CAD and Previous Myocardial Infarction; Risk                 Factors:Dyslipidemia, Diabetes, Sleep Apnea and Former Smoker.  Sonographer:    Wilkie Aye RVT RCS Referring Phys: [id number] [name]  1. Left ventricular ejection fraction, by estimation, is 50%. The left ventricle has mildly decreased function. The left ventricle demonstrates global hypokinesis. There is moderate concentric left ventricular hypertrophy. Left ventricular diastolic parameters are consistent with Grade II diastolic dysfunction (pseudonormalization). Elevated left atrial pressure.  2. Right ventricular systolic function is mildly reduced. The right ventricular size is normal. There is mildly elevated pulmonary artery systolic pressure. The estimated right ventricular systolic pressure is Q000111Q mmHg.  3. Left atrial size was severely dilated.  4. The mitral valve is normal in structure. Trivial mitral valve regurgitation. No evidence of mitral stenosis. Moderate mitral annular calcification.  5. The aortic valve is tricuspid. There is mild calcification of the aortic valve. There is mild thickening of the aortic valve. Aortic valve regurgitation is not visualized. Aortic valve sclerosis/calcification is present, without any evidence of aortic stenosis.  6. The inferior vena cava is normal in size with greater than 50% respiratory variability, suggesting right atrial pressure of 3 mmHg. Comparison(s): No significant change from prior study. Prior images reviewed side by side. FINDINGS  Left Ventricle: Left ventricular ejection fraction, by estimation, is 50%. The left ventricle has mildly decreased function. The left ventricle demonstrates global hypokinesis. 3D left ventricular ejection fraction analysis performed but not reported based on interpreter judgement due to suboptimal  tracking. The left ventricular internal cavity size was normal in size. There is moderate concentric left ventricular hypertrophy. Left ventricular diastolic parameters are consistent with Grade II diastolic dysfunction (pseudonormalization). Elevated left atrial pressure. Right Ventricle: The right ventricular size is normal. No increase in right ventricular wall thickness. Right ventricular  systolic function is mildly reduced. There is mildly elevated pulmonary artery systolic pressure. The tricuspid regurgitant velocity  is 2.85 m/s, and with an assumed right atrial pressure of 3 mmHg, the estimated right ventricular systolic pressure is Q000111Q mmHg. Left Atrium: Left atrial size was severely dilated. Right Atrium: Right atrial size was normal in size. Pericardium: There is no evidence of pericardial effusion. Mitral Valve: The mitral valve is normal in structure. Moderate mitral annular calcification. Trivial mitral valve regurgitation. No evidence of mitral valve stenosis. Tricuspid Valve: The tricuspid valve is normal in structure. Tricuspid valve regurgitation is trivial. Aortic Valve: The aortic valve is tricuspid. There is mild calcification of the aortic valve. There is mild thickening of the aortic valve. Aortic valve regurgitation is not visualized. Aortic valve sclerosis/calcification is present, without any evidence of aortic stenosis. Aortic valve mean gradient measures 7.3 mmHg. Aortic valve peak gradient measures 12.7 mmHg. Aortic valve area, by VTI measures 1.84 cm. Pulmonic Valve: The pulmonic valve was grossly normal. Pulmonic valve regurgitation is not visualized. Aorta: The aortic root and ascending aorta are structurally normal, with no evidence of dilitation. Venous: The inferior vena cava is normal in size with greater than 50% respiratory variability, suggesting right atrial pressure of 3 mmHg. IAS/Shunts: No atrial level shunt detected by color flow Doppler.  LEFT VENTRICLE PLAX 2D LVIDd:          5.20 cm      Diastology LVIDs:         4.50 cm      LV e' medial:    5.57 cm/s LV PW:         1.50 cm      LV E/e' medial:  18.3 LV IVS:        1.30 cm      LV e' lateral:   5.46 cm/s LVOT diam:     2.20 cm      LV E/e' lateral: 18.7 LV SV:         69 LV SV Index:   31           2D Longitudinal Strain LVOT Area:     3.80 cm     2D Strain GLS Avg:     -11.8 %  LV Volumes (MOD) LV vol d, MOD A2C: 91.0 ml  3D Volume EF: LV vol d, MOD A4C: 162.0 ml 3D EF:        54 % LV vol s, MOD A2C: 54.5 ml  LV EDV:       128 ml LV vol s, MOD A4C: 86.9 ml  LV ESV:       59 ml LV SV MOD A2C:     36.5 ml  LV SV:        69 ml LV SV MOD A4C:     162.0 ml LV SV MOD BP:      55.9 ml RIGHT VENTRICLE            IVC RV Basal diam:  3.80 cm    IVC diam: 2.20 cm RV S prime:     9.51 cm/s TAPSE (M-mode): 1.5 cm LEFT ATRIUM              Index        RIGHT ATRIUM           Index LA diam:        5.40 cm  2.42 cm/m   RA Area:     19.50 cm LA Vol (  A2C):   96.2 ml  43.10 ml/m  RA Volume:   48.20 ml  21.60 ml/m LA Vol (A4C):   124.0 ml 55.56 ml/m LA Biplane Vol: 113.0 ml 50.63 ml/m  AORTIC VALVE                     PULMONIC VALVE AV Area (Vmax):    1.99 cm      PV Vmax:       1.10 m/s AV Area (Vmean):   1.77 cm      PV Peak grad:  4.8 mmHg AV Area (VTI):     1.84 cm AV Vmax:           178.00 cm/s AV Vmean:          129.000 cm/s AV VTI:            0.374 m AV Peak Grad:      12.7 mmHg AV Mean Grad:      7.3 mmHg LVOT Vmax:         93.00 cm/s LVOT Vmean:        59.900 cm/s LVOT VTI:          0.181 m LVOT/AV VTI ratio: 0.49  AORTA Ao Root diam: 2.90 cm Ao Asc diam:  3.10 cm Ao Arch diam: 2.6 cm MITRAL VALVE                TRICUSPID VALVE MV Area (PHT): 4.47 cm     TR Peak grad:   32.5 mmHg MV Decel Time: 170 msec     TR Vmax:        285.00 cm/s MV E velocity: 102.10 cm/s MV A velocity: 40.80 cm/s   SHUNTS MV E/A ratio:  2.50         Systemic VTI:  0.18 m                             Systemic Diam: 2.20 cm Dani Gobble Croitoru MD Electronically  signed by Sanda Klein MD Signature Date/Time: 03/02/2022/3:17:58 PM    Final    CT Chest Wo Contrast  Result Date: 03/01/2022 CLINICAL DATA:  Nondiagnostic x-ray. Respiratory illness. Recent atrial fibrillation cardioversion. EXAM: CT CHEST WITHOUT CONTRAST TECHNIQUE: Multidetector CT imaging of the chest was performed following the standard protocol without IV contrast. RADIATION DOSE REDUCTION: This exam was performed according to the departmental dose-optimization program which includes automated exposure control, adjustment of the mA and/or kV according to patient size and/or use of iterative reconstruction technique. COMPARISON:  Radiography same day.  Radiography 09/25/2021. FINDINGS: Cardiovascular: Previous median sternotomy and CABG. Cardiomegaly. Coronary artery calcification and/or stents. Aortic atherosclerotic calcification without aneurysm. Mediastinum/Nodes: Mildly reactive hilar and mediastinal lymph nodes. Lungs/Pleura: Small pleural effusion on the left layering dependently. Widespread but patchy pulmonary infiltrate, more extensive within the left lung than the right, consistent with bronchopneumonia. Upper Abdomen: Cholelithiasis. No CT evidence of cholecystitis or obstruction Musculoskeletal: Ordinary chronic spinal degenerative changes. IMPRESSION: 1. Widespread but patchy pulmonary infiltrate, more extensive within the left lung than the right, consistent with bronchopneumonia. Small left pleural effusion layering dependently. 2. Previous median sternotomy and CABG. Cardiomegaly. Coronary artery calcification and/or stents. 3. Cholelithiasis without CT evidence of cholecystitis or obstruction. Aortic Atherosclerosis (ICD10-I70.0). Electronically Signed   By: Nelson Chimes M.D.   On: 03/01/2022 11:43   DG Chest Portable 1 View  Result Date: 03/01/2022 CLINICAL DATA:  Shortness of breath following cardioversion  on February 23rd. EXAM: PORTABLE CHEST 1 VIEW COMPARISON:  Chest radiograph  09/25/2021 FINDINGS: The heart is enlarged, unchanged. Median sternotomy wires are unchanged. There is vascular congestion. There is worsened opacity throughout the left lower lobe. There is no focal airspace opacity on the right. There is no significant pleural effusion. There is no pneumothorax There is no acute osseous abnormality. IMPRESSION: Worsened patchy opacities throughout the left lower lobe since 09/25/2021. Findings could reflect infection or asymmetric edema. Electronically Signed   By: Valetta Mole M.D.   On: 03/01/2022 09:41    Microbiology: Recent Results (from the past 240 hour(s))  Resp panel by RT-PCR (RSV, Flu A&B, Covid) Anterior Nasal Swab     Status: None   Collection Time: 03/01/22  9:18 AM   Specimen: Anterior Nasal Swab  Result Value Ref Range Status   SARS Coronavirus 2 by RT PCR NEGATIVE NEGATIVE Final    Comment: (NOTE) SARS-CoV-2 target nucleic acids are NOT DETECTED.  The SARS-CoV-2 RNA is generally detectable in upper respiratory specimens during the acute phase of infection. The lowest concentration of SARS-CoV-2 viral copies this assay can detect is 138 copies/mL. A negative result does not preclude SARS-Cov-2 infection and should not be used as the sole basis for treatment or other patient management decisions. A negative result may occur with  improper specimen collection/handling, submission of specimen other than nasopharyngeal swab, presence of viral mutation(s) within the areas targeted by this assay, and inadequate number of viral copies(<138 copies/mL). A negative result must be combined with clinical observations, patient history, and epidemiological information. The expected result is Negative.  Fact Sheet for Patients:  EntrepreneurPulse.com.au  Fact Sheet for Healthcare Providers:  IncredibleEmployment.be  This test is no t yet approved or cleared by the [place] FDA and  has been authorized for  detection and/or diagnosis of SARS-CoV-2 by FDA under an Emergency Use Authorization (EUA). This EUA will remain  in effect (meaning this test can be used) for the duration of the COVID-19 declaration under Section 564(b)(1) of the Act, 21 U.S.C.section 360bbb-3(b)(1), unless the authorization is terminated  or revoked sooner.       Influenza A by PCR NEGATIVE NEGATIVE Final   Influenza B by PCR NEGATIVE NEGATIVE Final    Comment: (NOTE) The Xpert Xpress SARS-CoV-2/FLU/RSV plus assay is intended as an aid in the diagnosis of influenza from Nasopharyngeal swab specimens and should not be used as a sole basis for treatment. Nasal washings and aspirates are unacceptable for Xpert Xpress SARS-CoV-2/FLU/RSV testing.  Fact Sheet for Patients: EntrepreneurPulse.com.au  Fact Sheet for Healthcare Providers: IncredibleEmployment.be  This test is not yet approved or cleared by the [place] FDA and has been authorized for detection and/or diagnosis of SARS-CoV-2 by FDA under an Emergency Use Authorization (EUA). This EUA will remain in effect (meaning this test can be used) for the duration of the COVID-19 declaration under Section 564(b)(1) of the Act, 21 U.S.C. section 360bbb-3(b)(1), unless the authorization is terminated or revoked.     Resp Syncytial Virus by PCR NEGATIVE NEGATIVE Final    Comment: (NOTE) Fact Sheet for Patients: EntrepreneurPulse.com.au  Fact Sheet for Healthcare Providers: IncredibleEmployment.be  This test is not yet approved or cleared by the [place] FDA and has been authorized for detection and/or diagnosis of SARS-CoV-2 by FDA under an Emergency Use Authorization (EUA). This EUA will remain in effect (meaning this test can be used) for the duration of the COVID-19 declaration under Section 564(b)(1) of the Act,  21 U.S.C. section 360bbb-3(b)(1), unless the authorization is  terminated or revoked.  Performed at [facility], [address] [place], [place] [address]   MRSA Next Gen by PCR, Nasal     Status: None   Collection Time: 03/01/22 11:02 PM  Result Value Ref Range Status   MRSA by PCR Next Gen NOT DETECTED NOT DETECTED Final    Comment: (NOTE) The GeneXpert MRSA Assay (FDA approved for NASAL specimens only), is one component of a comprehensive MRSA colonization surveillance program. It is not intended to diagnose MRSA infection nor to guide or monitor treatment for MRSA infections. Test performance is not FDA approved in patients less than 62 years old. Performed at [facility], [place] [address], [place], [place] [address]      Labs: Basic Metabolic Panel: Recent Labs  Lab 03/01/22 0945 03/02/22 0039 03/03/22 0034 03/05/22 0123 03/06/22 0139  NA 142 139 140 139 139  K 3.8 3.3* 4.4 3.7 3.8  CL 103 100 105 101 104  CO2 '27 26 25 25 28  '$ GLUCOSE 106* 106* 123* 108* 112*  BUN '21 19 20 22 20  '$ CREATININE 1.24 1.11 1.02 1.08 1.13  CALCIUM 9.9 8.8* 8.8* 9.0 8.6*   Liver Function Tests: Recent Labs  Lab 03/01/22 0945 03/02/22 0039  AST 26 29  ALT 28 30  ALKPHOS 92 93  BILITOT 0.6 0.8  PROT 7.4 6.2*  ALBUMIN 4.1 2.7*   No results for input(s): "LIPASE", "AMYLASE" in the last 168 hours. No results for input(s): "AMMONIA" in the last 168 hours. CBC: Recent Labs  Lab 03/01/22 0945 03/02/22 0039 03/03/22 0034 03/04/22 0033 03/05/22 0123 03/06/22 0139  WBC 13.4* 11.5* 12.5* 11.7* 11.1* 10.2  NEUTROABS 10.6*  --   --   --   --   --   HGB 10.7* 9.7* 9.6* 10.3* 10.2* 10.2*  HCT 34.7* 30.7* 30.9* 33.8* 33.9* 34.3*  MCV 74.9* 74.3* 75.7* 75.4* 76.4* 76.7*  PLT 366 365 366 439* 457* 466*   Cardiac Enzymes: No results for input(s): "CKTOTAL", "CKMB", "CKMBINDEX", "TROPONINI" in the last 168 hours. BNP: BNP (last 3 results) Recent Labs    03/01/22 0945  BNP 471.9*    ProBNP (last 3 results) No  results for input(s): "PROBNP" in the last 8760 hours.  CBG: Recent Labs  Lab 03/05/22 0551 03/05/22 1202 03/05/22 1637 03/05/22 2121 03/06/22 0629  GLUCAP 105* 114* 104* 118* 108*       Signed:  Domenic Polite MD.  [facility] 03/06/2022, 11:08 AM

## 2022-03-06 NOTE — TOC Transition Note (Signed)
Transition of Care [facility]) - CM/SW Discharge Note   Patient Details  Name: Gary [name] MRN: [id number] Date of Birth: 25-Jul-1945  [facility] CM/SW Contact:  Bethena Roys, RN Phone Number: 03/06/2022, 12:51 PM   Clinical Narrative: Oxygen ordered via [employer]- office to deliver portable tank to the room prior to transition home. No further home needs identified.  Final next level of care: Home/Self Care Barriers to Discharge: No Barriers Identified   Patient Goals and CMS Choice   Choice offered to / list presented to : NA  Discharge Plan and Services Additional resources added to the After Visit Summary for   In-house Referral: NA Discharge Planning Services: CM Consult Post Acute Care Choice: NA          DME Arranged: N/A DME Agency: NA   HH Arranged: NA   Social Determinants of Health (SDOH) Interventions SDOH Screenings   Food Insecurity: No Food Insecurity (03/02/2022)  Housing: Low Risk  (03/02/2022)  Transportation Needs: No Transportation Needs (03/02/2022)  Utilities: Not At Risk (03/02/2022)  Tobacco Use: Medium Risk (03/01/2022)     Readmission Risk Interventions    03/02/2022    4:16 PM  Readmission Risk Prevention Plan  Transportation Screening Complete  HRI or Home Care Consult Complete  Palliative Care Screening Not Applicable  Medication Review (RN Care Manager) Complete

## 2022-03-06 NOTE — Plan of Care (Signed)
  Problem: Education: Goal: Knowledge of General Education information will improve Description: Including pain rating scale, medication(s)/side effects and non-pharmacologic comfort measures 03/06/2022 1302 by Mertha Finders, RN Outcome: Adequate for Discharge 03/06/2022 1302 by Mertha Finders, RN Outcome: Progressing   Problem: Health Behavior/Discharge Planning: Goal: Ability to manage health-related needs will improve 03/06/2022 1302 by Mertha Finders, RN Outcome: Adequate for Discharge 03/06/2022 1302 by Mertha Finders, RN Outcome: Progressing   Problem: Clinical Measurements: Goal: Ability to maintain clinical measurements within normal limits will improve 03/06/2022 1302 by Mertha Finders, RN Outcome: Adequate for Discharge 03/06/2022 1302 by Mertha Finders, RN Outcome: Progressing Goal: Will remain free from infection 03/06/2022 1302 by Mertha Finders, RN Outcome: Adequate for Discharge 03/06/2022 1302 by Mertha Finders, RN Outcome: Progressing Goal: Diagnostic test results will improve 03/06/2022 1302 by Mertha Finders, RN Outcome: Adequate for Discharge 03/06/2022 1302 by Mertha Finders, RN Outcome: Progressing Goal: Respiratory complications will improve 03/06/2022 1302 by Mertha Finders, RN Outcome: Adequate for Discharge 03/06/2022 1302 by Mertha Finders, RN Outcome: Progressing Goal: Cardiovascular complication will be avoided 03/06/2022 1302 by Mertha Finders, RN Outcome: Adequate for Discharge 03/06/2022 1302 by Mertha Finders, RN Outcome: Progressing   Problem: Activity: Goal: Risk for activity intolerance will decrease 03/06/2022 1302 by Mertha Finders, RN Outcome: Adequate for Discharge 03/06/2022 1302 by Mertha Finders, RN Outcome: Progressing   Problem: Nutrition: Goal: Adequate nutrition will be maintained 03/06/2022 1302 by Mertha Finders, RN Outcome: Adequate for Discharge 03/06/2022 1302 by Mertha Finders, RN Outcome: Progressing    Problem: Coping: Goal: Level of anxiety will decrease 03/06/2022 1302 by Mertha Finders, RN Outcome: Adequate for Discharge 03/06/2022 1302 by Mertha Finders, RN Outcome: Progressing   Problem: Elimination: Goal: Will not experience complications related to bowel motility 03/06/2022 1302 by Mertha Finders, RN Outcome: Adequate for Discharge 03/06/2022 1302 by Mertha Finders, RN Outcome: Progressing Goal: Will not experience complications related to urinary retention 03/06/2022 1302 by Mertha Finders, RN Outcome: Adequate for Discharge 03/06/2022 1302 by Mertha Finders, RN Outcome: Progressing   Problem: Pain Managment: Goal: General experience of comfort will improve 03/06/2022 1302 by Mertha Finders, RN Outcome: Adequate for Discharge 03/06/2022 1302 by Mertha Finders, RN Outcome: Progressing   Problem: Safety: Goal: Ability to remain free from injury will improve 03/06/2022 1302 by Mertha Finders, RN Outcome: Adequate for Discharge 03/06/2022 1302 by Mertha Finders, RN Outcome: Progressing   Problem: Skin Integrity: Goal: Risk for impaired skin integrity will decrease 03/06/2022 1302 by Mertha Finders, RN Outcome: Adequate for Discharge 03/06/2022 1302 by Mertha Finders, RN Outcome: Progressing   Problem: Activity: Goal: Ability to tolerate increased activity will improve 03/06/2022 1302 by Mertha Finders, RN Outcome: Adequate for Discharge 03/06/2022 1302 by Mertha Finders, RN Outcome: Progressing   Problem: Clinical Measurements: Goal: Ability to maintain a body temperature in the normal range will improve 03/06/2022 1302 by Mertha Finders, RN Outcome: Adequate for Discharge 03/06/2022 1302 by Mertha Finders, RN Outcome: Progressing   Problem: Respiratory: Goal: Ability to maintain adequate ventilation will improve 03/06/2022 1302 by Mertha Finders, RN Outcome: Adequate for Discharge 03/06/2022 1302 by Mertha Finders, RN Outcome: Progressing Goal:  Ability to maintain a clear airway will improve 03/06/2022 1302 by Mertha Finders, RN Outcome: Adequate for Discharge 03/06/2022 1302 by Mertha Finders, RN Outcome: Progressing

## 2022-03-08 ENCOUNTER — Telehealth: Payer: Self-pay | Admitting: *Deleted

## 2022-03-08 ENCOUNTER — Other Ambulatory Visit: Payer: Self-pay | Admitting: *Deleted

## 2022-03-08 DIAGNOSIS — I509 Heart failure, unspecified: Secondary | ICD-10-CM

## 2022-03-08 NOTE — Progress Notes (Signed)
  Care Coordination   Note   03/08/2022 Name: CARROLL BUMPUS MRN: [id number] DOB: [birth year]  KEINO WATES is a 77 y.o. year old male who sees [name], [place] for primary care. I reached out to [name] by phone today to offer care coordination services.  Mr. Corgan was given information about Care Coordination services today including:   The Care Coordination services include support from the care team which includes your Nurse Coordinator, Clinical Social Worker, or Pharmacist.  The Care Coordination team is here to help remove barriers to the health concerns and goals most important to you. Care Coordination services are voluntary, and the patient may decline or stop services at any time by request to their care team member.   Care Coordination Consent Status: Patient agreed to services and verbal consent obtained.   Follow up plan:  Telephone appointment with care coordination team member scheduled for:  03/09/2022  Encounter Outcome:  Pt. Scheduled from referral   Julian Hy, [place] Direct Dial: [phone]

## 2022-03-09 ENCOUNTER — Ambulatory Visit: Payer: Self-pay

## 2022-03-09 NOTE — Patient Instructions (Signed)
Visit Information  Thank you for taking time to visit with me today. Please don't hesitate to contact me if I can be of assistance to you.   Following are the goals we discussed today:   Goals Addressed             This Visit's Progress    RN Care Coordination Activities: further follow up required       Care Coordination Interventions: Evaluation of current treatment plan related to aspirational pneumonia and patient's adherence to plan as established by provider Reviewed importance of adherence to anticoagulant exactly as prescribed Counseled on importance of regular laboratory monitoring as prescribed Afib action plan reviewed Provided education on low sodium diet Reviewed Heart Failure Action Plan in depth and provided written copy Assessed need for readable accurate scales in home Discussed importance of daily weight and advised patient to weigh and record daily Assessed social determinant of health barriers  Educated patient on the importance of using his Inspirometer 10 x per hour daily while awake to help increase lung capacity and move trapped air Educated patient about the importance of balancing his activity with plenty of rest and staying well hydrated with water Educated patient about the signs/symptoms suggestive of aspirational pneumonia and when to call his doctor and or seek medical attention promptly Reviewed scheduled/upcoming provider appointments           Our next appointment is by telephone on 04/06/22 at 2:30 PM  Please call the care guide team at [phone] if you need to cancel or reschedule your appointment.   If you are experiencing a Mental Health or [place] or need someone to talk to, please call 1-800-273-TALK (toll free, 24 hour hotline) go to [facility] [address] [place] [phone])  Patient verbalizes understanding of instructions and care plan provided today and agrees to  view in [place]. Active MyChart status and patient understanding of how to access instructions and care plan via MyChart confirmed with patient.     Barb Merino, RN, BSN, CCM Care Management Coordinator [facility] Care Management  Direct Phone: [phone]

## 2022-03-09 NOTE — Patient Outreach (Signed)
  Care Coordination   Initial Visit Note   03/09/2022 Name: ZORA COHO MRN: [id number] DOB: August 03, 1945  JESSEN HAILU is a 77 y.o. year old male who sees [name], [place] for primary care. I spoke with  [name] by phone today.  What matters to the patients health and wellness today?  Patient hopes to make a full recovery from aspirational pneumonia. He hopes to be able to continue to help care for his wife and make family decisions.    Goals Addressed             This Visit's Progress    RN Care Coordination Activities: further follow up required       Care Coordination Interventions: Evaluation of current treatment plan related to aspirational pneumonia and patient's adherence to plan as established by provider Reviewed importance of adherence to anticoagulant exactly as prescribed Counseled on importance of regular laboratory monitoring as prescribed Afib action plan reviewed Provided education on low sodium diet Reviewed Heart Failure Action Plan in depth and provided written copy Assessed need for readable accurate scales in home Discussed importance of daily weight and advised patient to weigh and record daily Assessed social determinant of health barriers  Educated patient on the importance of using his Inspirometer 10 x per hour daily while awake to help increase lung capacity and move trapped air Educated patient about the importance of balancing his activity with plenty of rest and staying well hydrated with water Educated patient about the signs/symptoms suggestive of aspirational pneumonia and when to call his doctor and or seek medical attention promptly Reviewed scheduled/upcoming provider appointments           SDOH assessments and interventions completed:  Yes  SDOH Interventions Today    Flowsheet Row Most Recent Value  SDOH Interventions   Transportation Interventions Intervention Not Indicated        Care Coordination  Interventions:  Yes, provided   Follow up plan: Follow up call scheduled for 04/06/22 '@2'$ :30 PM    Encounter Outcome:  Pt. Visit Completed

## 2022-03-11 ENCOUNTER — Ambulatory Visit: Payer: Medicare Other | Attending: Cardiology | Admitting: *Deleted

## 2022-03-11 DIAGNOSIS — Z5181 Encounter for therapeutic drug level monitoring: Secondary | ICD-10-CM | POA: Diagnosis not present

## 2022-03-11 DIAGNOSIS — I480 Paroxysmal atrial fibrillation: Secondary | ICD-10-CM | POA: Insufficient documentation

## 2022-03-11 DIAGNOSIS — Z7901 Long term (current) use of anticoagulants: Secondary | ICD-10-CM | POA: Insufficient documentation

## 2022-03-11 LAB — POCT INR: POC INR: 3

## 2022-03-11 NOTE — Patient Instructions (Addendum)
  Description   Continue taking warfarin 1 tablet daily, except 1/2 tablet Monday, Wednesday and Friday. Recheck INR in 1 week (pt usually 6 weeks).  Please call us at [phone] with any questions.

## 2022-03-13 ENCOUNTER — Encounter (HOSPITAL_BASED_OUTPATIENT_CLINIC_OR_DEPARTMENT_OTHER): Payer: Self-pay | Admitting: Cardiovascular Disease

## 2022-03-13 DIAGNOSIS — I480 Paroxysmal atrial fibrillation: Secondary | ICD-10-CM

## 2022-03-16 ENCOUNTER — Telehealth (HOSPITAL_BASED_OUTPATIENT_CLINIC_OR_DEPARTMENT_OTHER): Payer: Self-pay | Admitting: Cardiovascular Disease

## 2022-03-16 NOTE — Telephone Encounter (Signed)
Spoke with patient regarding the upcoming Monday 03/22/22 9:30 am appointment with Roderic Palau,  in the A Fib clinic----arrival time is 9:15 am 6the floor at [facility] voiced his understanding.

## 2022-03-18 DIAGNOSIS — E1169 Type 2 diabetes mellitus with other specified complication: Secondary | ICD-10-CM | POA: Diagnosis not present

## 2022-03-18 DIAGNOSIS — I10 Essential (primary) hypertension: Secondary | ICD-10-CM | POA: Diagnosis not present

## 2022-03-18 DIAGNOSIS — Z7952 Long term (current) use of systemic steroids: Secondary | ICD-10-CM | POA: Diagnosis not present

## 2022-03-18 DIAGNOSIS — I739 Peripheral vascular disease, unspecified: Secondary | ICD-10-CM | POA: Diagnosis not present

## 2022-03-18 DIAGNOSIS — D500 Iron deficiency anemia secondary to blood loss (chronic): Secondary | ICD-10-CM | POA: Diagnosis not present

## 2022-03-18 DIAGNOSIS — Z09 Encounter for follow-up examination after completed treatment for conditions other than malignant neoplasm: Secondary | ICD-10-CM | POA: Diagnosis not present

## 2022-03-18 DIAGNOSIS — I25729 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: Secondary | ICD-10-CM | POA: Diagnosis not present

## 2022-03-18 DIAGNOSIS — I4891 Unspecified atrial fibrillation: Secondary | ICD-10-CM | POA: Diagnosis not present

## 2022-03-18 DIAGNOSIS — I5022 Chronic systolic (congestive) heart failure: Secondary | ICD-10-CM | POA: Diagnosis not present

## 2022-03-18 DIAGNOSIS — D6869 Other thrombophilia: Secondary | ICD-10-CM | POA: Diagnosis not present

## 2022-03-18 DIAGNOSIS — I25118 Atherosclerotic heart disease of native coronary artery with other forms of angina pectoris: Secondary | ICD-10-CM | POA: Diagnosis not present

## 2022-03-18 DIAGNOSIS — I719 Aortic aneurysm of unspecified site, without rupture: Secondary | ICD-10-CM | POA: Diagnosis not present

## 2022-03-18 DIAGNOSIS — D649 Anemia, unspecified: Secondary | ICD-10-CM | POA: Diagnosis not present

## 2022-03-18 DIAGNOSIS — E039 Hypothyroidism, unspecified: Secondary | ICD-10-CM | POA: Diagnosis not present

## 2022-03-19 ENCOUNTER — Ambulatory Visit: Payer: Medicare Other | Attending: Cardiovascular Disease | Admitting: *Deleted

## 2022-03-19 DIAGNOSIS — Z7901 Long term (current) use of anticoagulants: Secondary | ICD-10-CM | POA: Diagnosis not present

## 2022-03-19 DIAGNOSIS — I480 Paroxysmal atrial fibrillation: Secondary | ICD-10-CM

## 2022-03-19 LAB — POCT INR: POC INR: 3.2

## 2022-03-19 NOTE — Patient Instructions (Signed)
Description   Hold warfarin today and then continue taking warfarin 1 tablet daily, except 1/2 tablet Monday, Wednesday and Friday. Recheck INR in 2 weeks (typically 6 weeks).  Please call us at [phone] with any questions.

## 2022-03-22 ENCOUNTER — Ambulatory Visit (HOSPITAL_COMMUNITY)
Admission: RE | Admit: 2022-03-22 | Discharge: 2022-03-22 | Disposition: A | Discharge status: Home / Self Care | Payer: Medicare Other | Source: Ambulatory Visit | Attending: Nurse Practitioner | Admitting: Nurse Practitioner

## 2022-03-22 VITALS — BP 136/82 | HR 74 | Ht 70.0 in | Wt 235.2 lb

## 2022-03-22 DIAGNOSIS — I2510 Atherosclerotic heart disease of native coronary artery without angina pectoris: Secondary | ICD-10-CM | POA: Insufficient documentation

## 2022-03-22 DIAGNOSIS — I480 Paroxysmal atrial fibrillation: Secondary | ICD-10-CM

## 2022-03-22 DIAGNOSIS — Z7985 Long-term (current) use of injectable non-insulin antidiabetic drugs: Secondary | ICD-10-CM | POA: Insufficient documentation

## 2022-03-22 DIAGNOSIS — Z6836 Body mass index (BMI) 36.0-36.9, adult: Secondary | ICD-10-CM | POA: Diagnosis not present

## 2022-03-22 DIAGNOSIS — E669 Obesity, unspecified: Secondary | ICD-10-CM | POA: Insufficient documentation

## 2022-03-22 DIAGNOSIS — Z79899 Other long term (current) drug therapy: Secondary | ICD-10-CM | POA: Insufficient documentation

## 2022-03-22 DIAGNOSIS — I10 Essential (primary) hypertension: Secondary | ICD-10-CM | POA: Insufficient documentation

## 2022-03-22 DIAGNOSIS — Z87891 Personal history of nicotine dependence: Secondary | ICD-10-CM | POA: Diagnosis not present

## 2022-03-22 DIAGNOSIS — D6869 Other thrombophilia: Secondary | ICD-10-CM

## 2022-03-22 DIAGNOSIS — Z7901 Long term (current) use of anticoagulants: Secondary | ICD-10-CM | POA: Diagnosis not present

## 2022-03-22 DIAGNOSIS — I484 Atypical atrial flutter: Secondary | ICD-10-CM

## 2022-03-22 DIAGNOSIS — G4733 Obstructive sleep apnea (adult) (pediatric): Secondary | ICD-10-CM | POA: Insufficient documentation

## 2022-03-22 NOTE — Progress Notes (Addendum)
Primary Care Physician: [name], DO Primary Cardiologist: Dr. Oval Linsey Primary Electrophysiologist: N/A Referring Physician: Dr. Drucilla Schmidt Gary [name] is a 77 y.o. male with a history of hypertension, melanoma, CAD s/p PCI and CABG, mild ascending aortic aneurysm, chronic systolic and diastolic heart failure, hyperlipidemia, paroxysmal atrial fibrillation, and OSA non-compliant with CPAP who presents for consultation in the [facility]. Patient is on coumadin for a CHADS2VASC score of 6.  He underwent scheduled cardioversion on 02/26/22, for afib that was found in January. CV was successful in converting to sinus rhythm on the second shock .Unfortunately, he was admitted from 2/26-3/2 for aspiration pneumonia possibly secondary to  intubation/ DCCV as pt was on Ozempic, which was not stopped and he did not want to delay CV. He appeared to have been in sinus rhythm during admission. He is currently on carvedilol and warfarin. No bleeding concerns. He does state was on eliquis a couple years ago but switched to coumadin due to cost while in the donut hole. He is in rate controlled afib today and was not aware.   He cannot specifically tell me when he went back into Afib since hospital discharge. He admits to overall feeling low energy but it is not new and has felt this way for a while. He definitely feels better than he did 2 weeks ago. Has had Afib for about 4 years intermittently; required cardioversion about a year or so ago. Denies shortness of breath or palpitations. He is now compliant with his CPAP.   Today, he denies symptoms of palpitations, chest pain, shortness of breath, orthopnea, PND, lower extremity edema, dizziness, presyncope, syncope, snoring, daytime somnolence, bleeding, or neurologic sequela. The patient is tolerating medications without difficulties and is otherwise without complaint today.    Atrial Fibrillation Risk Factors:  he does  have symptoms or diagnosis of sleep apnea. he is not compliant with CPAP therapy. he does not have a history of rheumatic fever. he does not have a history of alcohol use. The patient does not have a history of early familial atrial fibrillation or other arrhythmias.  he has a BMI of Body mass index is 36.34 kg/m.Marland Kitchen There were no vitals filed for this visit.  Family History  Problem Relation Age of Onset   Heart attack Mother    Other Father        odd stomach disorder   Stroke Maternal Grandmother    Heart attack Maternal Grandfather    Heart attack Paternal Grandmother    Heart attack Paternal Grandfather      Atrial Fibrillation Management history:  Previous antiarrhythmic drugs: None Previous cardioversions: 02/26/22 Previous ablations: None CHADS2VASC score: 6 Anticoagulation history: Coumadin   Past Medical History:  Diagnosis Date   Aortic aneurysm ([place]) 02/20/2016   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   Arthritis    Atrial fibrillation ([place]) 11/13/2014   Cancer ([place])    skin   Chronic combined systolic and diastolic heart failure ([place]) 02/20/2016   LVEF 45-50% 11/2014   Coronary artery disease    Depression    Essential hypertension 11/13/2014   Hyperlipidemia    Hyperlipidemia 11/13/2014   Hypertension    Sleep apnea    CPAP 'Uses half the time"  last study 2009   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   Past Surgical History:  Procedure Laterality Date   CARDIOVERSION N/A 12/06/2014   Procedure: CARDIOVERSION;  Surgeon: [name], MD;  Location: [facility];  Service: Cardiovascular;  Laterality: N/A;   CARDIOVERSION N/A 05/26/2017   Procedure: CARDIOVERSION;  Surgeon: [name], MD;  Location: [facility];  Service: Cardiovascular;  Laterality: N/A;   CARDIOVERSION N/A 04/25/2019   Procedure: CARDIOVERSION;  Surgeon: Sanda Klein, MD;  Location: [facility] ENDOSCOPY;  Service: Cardiovascular;  Laterality: N/A;   CARDIOVERSION N/A 02/26/2022    Procedure: CARDIOVERSION;  Surgeon: [name], MD;  Location: [facility] ENDOSCOPY;  Service: Cardiovascular;  Laterality: N/A;   [place]     multiple stents   CORONARY ARTERY BYPASS GRAFT  2006   CORONARY STENT INTERVENTION N/A 09/28/2021   Procedure: CORONARY STENT INTERVENTION;  Surgeon: Lorretta Harp, MD;  Location: [facility] CV LAB;  Service: Cardiovascular;  Laterality: N/A;  SVG-PDA   EYE SURGERY     right eye-  muscular repair   LEFT HEART CATH AND CORS/GRAFTS ANGIOGRAPHY N/A 09/28/2021   Procedure: LEFT HEART CATH AND CORS/GRAFTS ANGIOGRAPHY;  Surgeon: Lorretta Harp, MD;  Location: [facility] CV LAB;  Service: Cardiovascular;  Laterality: N/A;   TOTAL HIP ARTHROPLASTY  01/03/2012   Procedure: TOTAL HIP ARTHROPLASTY;  Surgeon: [name], MD;  Location: [facility];  Service: Orthopedics;  Laterality: Right;    Current Outpatient Medications  Medication Sig Dispense Refill   ALPRAZolam (XANAX) 0.5 MG tablet Take 0.5 mg by mouth at bedtime.     calcium carbonate (OS-CAL) 600 MG TABS tablet Take 600 mg by mouth daily with breakfast.     carvedilol (COREG) 25 MG tablet Take 0.5 tablets (12.5 mg total) by mouth 2 (two) times daily.     Cholecalciferol (VITAMIN D3) 125 MCG (5000 UT) TABS Take 10,000 Units by mouth daily.     clopidogrel (PLAVIX) 75 MG tablet Take 1 tablet (75 mg total) by mouth daily with breakfast. Take every day for 1 year 90 tablet 3   Coenzyme Q10 (CO Q 10 PO) Take 400 mg by mouth daily.     levothyroxine (SYNTHROID, LEVOTHROID) 137 MCG tablet Take 137 mcg by mouth daily before breakfast.     loratadine (CLARITIN) 10 MG tablet Take 10 mg by mouth daily.     metFORMIN (GLUCOPHAGE) 500 MG tablet Take 500 mg by mouth 2 (two) times daily.  12   Multiple Vitamin (MULTIVITAMIN WITH MINERALS) TABS tablet Take 1 tablet by mouth daily. Centrum silver men     nitroGLYCERIN (NITROSTAT) 0.4 MG SL tablet Place 1 tablet (0.4 mg  total) under the tongue every 5 (five) minutes x 3 doses as needed for chest pain. 25 tablet 12   pantoprazole (PROTONIX) 40 MG tablet Take 40 mg by mouth 2 (two) times daily.     predniSONE (DELTASONE) 5 MG tablet Take 5 mg by mouth daily with breakfast.     REPATHA SURECLICK [id number] MG/ML SOAJ INJECT 140 MG INTO THE SKIN EVERY 14 (FOURTEEN) DAYS. INJECT 1 PEN INTO THE FATTY TISSUE SKIN EVERY 14 DAY 6 mL 3   riTUXimab (RITUXAN IV) Inject 1 application  into the vein See admin instructions. Take every two weeks and every six months alternating     Semaglutide,0.25 or 0.5MG /DOS, (OZEMPIC, 0.25 OR 0.5 MG/DOSE,) 2 MG/3ML SOPN Inject 5 mg into the skin See admin instructions. Every two weeks     spironolactone (ALDACTONE) 25 MG tablet Take 0.5 tablets (12.5 mg total) by mouth daily. 30 tablet 0   sulfamethoxazole-trimethoprim (BACTRIM) 400-80 MG tablet Take 1 tablet by mouth daily. Restart after antibiotic for pneumonia  completed     testosterone cypionate (DEPOTESTOTERONE CYPIONATE) 100 MG/ML injection Inject 100 mg into the muscle every 14 (fourteen) days.     venlafaxine XR (EFFEXOR-XR) 150 MG 24 hr capsule Take 150 mg by mouth daily.      warfarin (COUMADIN) 5 MG tablet TAKE 1/2 TABLET TO 1 TABLET BY MOUTH DAILY AS DIRECTED BY COUMADIN CLINIC (Patient taking differently: Take 2.5-5 mg by mouth See admin instructions. Take 2.5 mg on Wednesday all the other days take 5 mg in the morning AS DIRECTED BY COUMADIN CLINIC) 75 tablet 0   Zinc Chelated 22.5 MG TABS Take 22.5 mg by mouth daily.     No current facility-administered medications for this encounter.    Allergies  Allergen Reactions   Statins Other (See Comments)    Muscle pain    Social History   Socioeconomic History   Marital status: Married    Spouse name: Not on file   Number of children: Not on file   Years of education: Not on file   Highest education level: Not on file  Occupational History   Not on file  Tobacco Use   Smoking  status: Former    Types: Cigarettes    Quit date: 12/24/1983    Years since quitting: 38.2   Smokeless tobacco: Never  Vaping Use   Vaping Use: Never used  Substance and Sexual Activity   Alcohol use: No    Alcohol/week: 0.0 standard drinks of alcohol   Drug use: No   Sexual activity: Not on file  Other Topics Concern   Not on file  Social History Narrative   Not on file   Social Determinants of Health   Financial Resource Strain: Not on file  Food Insecurity: No Food Insecurity (03/02/2022)   Hunger Vital Sign    Worried About Running Out of Food in the Last Year: Never true    Ran Out of Food in the Last Year: Never true  Transportation Needs: No Transportation Needs (03/09/2022)   PRAPARE - Hydrologist (Medical): No    Lack of Transportation (Non-Medical): No  Physical Activity: Not on file  Stress: Not on file  Social Connections: Not on file  Intimate Partner Violence: Not At Risk (03/02/2022)   Humiliation, Afraid, Rape, and Kick questionnaire    Fear of Current or Ex-Partner: No    Emotionally Abused: No    Physically Abused: No    Sexually Abused: No     ROS- All systems are reviewed and negative except as per the HPI above.  Physical Exam: Vitals:   03/22/22 0907  Height: 5\' 10"  (1.778 m)    GEN- The patient is a well appearing male, alert and oriented x 3 today.   Head- normocephalic, atraumatic Eyes-  Sclera clear, conjunctiva pink Ears- hearing intact Oropharynx- clear Neck- supple  Lungs- Clear to ausculation bilaterally, normal work of breathing Heart- Irregular rate and rhythm, no murmurs, rubs or gallops  GI- soft, NT, ND, + BS Extremities- no clubbing, cyanosis, or edema MS- no significant deformity or atrophy Skin- no rash or lesion Psych- euthymic mood, full affect Neuro- strength and sensation are intact  Wt Readings from Last 3 Encounters:  03/06/22 114.9 kg  02/26/22 112 kg  02/03/22 112.4 kg    EKG  today demonstrates Atrial fibrillation HR 74 nonspecific ST and T wave abnormality  Echo 03/02/22 demonstrated    1. Left ventricular ejection fraction, by estimation, is 50%. The  left  ventricle has mildly decreased function. The left ventricle demonstrates  global hypokinesis. There is moderate concentric left ventricular  hypertrophy. Left ventricular diastolic  parameters are consistent with Grade II diastolic dysfunction  (pseudonormalization). Elevated left atrial pressure.   2. Right ventricular systolic function is mildly reduced. The right  ventricular size is normal. There is mildly elevated pulmonary artery  systolic pressure. The estimated right ventricular systolic pressure is  [id number] mmHg.   3. Left atrial size was severely dilated.   4. The mitral valve is normal in structure. Trivial mitral valve  regurgitation. No evidence of mitral stenosis. Moderate mitral annular  calcification.   5. The aortic valve is tricuspid. There is mild calcification of the  aortic valve. There is mild thickening of the aortic valve. Aortic valve  regurgitation is not visualized. Aortic valve sclerosis/calcification is  present, without any evidence of  aortic stenosis.   6. The inferior vena cava is normal in size with greater than 50%  respiratory variability, suggesting right atrial pressure of 3 mmHg.   Comparison(s): No significant change from prior study. Prior images  reviewed side by side.   Heart cath 09/28/21:    Prox LAD to Mid LAD lesion is 100% stenosed.   Ost LAD to Prox LAD lesion is 100% stenosed.   Ramus lesion is 100% stenosed.   Prox RCA lesion is 100% stenosed.   Prox RCA to Dist RCA lesion is 100% stenosed.   Ost Cx to Prox Cx lesion is 30% stenosed.   Dist Graft to Insertion lesion is 99% stenosed.   A drug-eluting stent was successfully placed using a SYNERGY XD [id number].   Post intervention, there is a 0% residual stenosis.  Epic records are reviewed at length  today  CHA2DS2-VASc Score = 6  The patient's score is based upon: CHF History: 1 HTN History: 1 Diabetes History: 1 Stroke History: 0 Vascular Disease History: 1 Age Score: 2 Gender Score: 0       ASSESSMENT AND PLAN: Persistent Atrial Fibrillation (ICD10:  I48.0) The patient's CHA2DS2-VASc score is 6, indicating a 9.7% annual risk of stroke.    He is in Afib today. I provided education regarding Afib and we discussed possible treatments for it. Given his history of CAD, would advise against class IC medications. Possible medication treatments include Tikosyn or amiodarone. He is at this time not interested in amiodarone due to potential liver toxicity - he states with history of Wegener's would like to avoid.   He is interested in Afib ablation as a possible treatment choice. I did go over his recent echo and advised with severely dilated left atrium this could make ablation difficult. We will go ahead and schedule him to see EP physician to discuss tikosyn vs ablation treatments.   2. Secondary Hypercoagulable State (ICD10:  D68.69) The patient is at significant risk for stroke/thromboembolism based upon his CHA2DS2-VASc Score of 6.  Continue Warfarin (Coumadin).   Continue coumadin. We did discuss possibility of transition away from coumadin to eliquis or xarelto, especially in the setting of possible ablation.   3. Obesity Body mass index is 36.34 kg/m. Lifestyle modification was discussed at length including regular exercise and weight reduction.  Encouraged increase in walking to help lose weight  4. Obstructive sleep apnea The importance of adequate treatment of sleep apnea was discussed today in order to improve our ability to maintain sinus rhythm long term.  He is now compliant with his CPAP and uses it nightly.  5. Hypertension Stable, continue current regimen  Will arrange appt with EP physician to discuss medication treatment vs ablation.   Geroge Baseman Rosary Filosa,  [facility] [address] [place], [place] [address] [phone]

## 2022-03-25 DIAGNOSIS — M3130 Wegener's granulomatosis without renal involvement: Secondary | ICD-10-CM | POA: Diagnosis not present

## 2022-03-26 ENCOUNTER — Ambulatory Visit (INDEPENDENT_AMBULATORY_CARE_PROVIDER_SITE_OTHER): Payer: Medicare Other | Admitting: Family

## 2022-03-26 ENCOUNTER — Encounter (HOSPITAL_BASED_OUTPATIENT_CLINIC_OR_DEPARTMENT_OTHER): Payer: Self-pay | Admitting: Family

## 2022-03-26 VITALS — BP 124/64 | HR 80 | Ht 70.0 in | Wt 238.0 lb

## 2022-03-26 DIAGNOSIS — I10 Essential (primary) hypertension: Secondary | ICD-10-CM

## 2022-03-26 DIAGNOSIS — I25118 Atherosclerotic heart disease of native coronary artery with other forms of angina pectoris: Secondary | ICD-10-CM | POA: Diagnosis not present

## 2022-03-26 DIAGNOSIS — D6859 Other primary thrombophilia: Secondary | ICD-10-CM | POA: Diagnosis not present

## 2022-03-26 DIAGNOSIS — I4819 Other persistent atrial fibrillation: Secondary | ICD-10-CM | POA: Diagnosis not present

## 2022-03-26 DIAGNOSIS — E785 Hyperlipidemia, unspecified: Secondary | ICD-10-CM | POA: Diagnosis not present

## 2022-03-26 MED ORDER — DAPAGLIFLOZIN PROPANEDIOL 10 MG PO TABS: 10.0000 mg | ORAL_TABLET | Freq: Every day | ORAL | 3 refills | Status: DC

## 2022-03-26 MED ORDER — DAPAGLIFLOZIN PROPANEDIOL 10 MG PO TABS: 10.0000 mg | ORAL_TABLET | Freq: Every day | ORAL | 0 refills | Status: DC

## 2022-03-26 NOTE — Patient Instructions (Signed)
Medication Instructions:  Your physician has recommended you make the following change in your medication:  START Wilder Glade one 10mg  tablet daily  Loel Dubonnet, NP will reach out to Dr. Oval Linsey and our pharmacy team about switching your Warfarin to Eliquis and if this is cost effectived  *If you need a refill on your cardiac medications before your next appointment, please call your pharmacy*   Lab Work: Your physician recommends that you return for lab work in 1-2 weeks for [facility]  If you have labs (blood work) drawn today and your tests are completely normal, you will receive your results only by: [place] (if you have MyChart) OR A paper copy in the mail If you have any lab test that is abnormal or we need to change your treatment, we will call you to review the results.  Follow-Up: At [facility], you and your health needs are our priority.  As part of our continuing mission to provide you with exceptional heart care, we have created designated Provider Care Teams.  These Care Teams include your primary Cardiologist (physician) and Advanced Practice Providers (APPs -  Physician Assistants and Nurse Practitioners) who all work together to provide you with the care you need, when you need it.  We recommend signing up for the patient portal called "MyChart".  Sign up information is provided on this After Visit Summary.  MyChart is used to connect with patients for Virtual Visits (Telemedicine).  Patients are able to view lab/test results, encounter notes, upcoming appointments, etc.  Non-urgent messages can be sent to your provider as well.   To learn more about what you can do with MyChart, go to [email or web address].    Your next appointment:   In 3 months with Loel Dubonnet, NP and in 6-8 months with Dr. Oval Linsey  Other Instructions  Recommend weighing daily and keeping a log. Please call our office if you have weight gain of 2 pounds overnight or 5 pounds in 1  week.   Date  Time Weight

## 2022-03-26 NOTE — Progress Notes (Unsigned)
Office Visit    Patient Name: Gary [name] Date of Encounter: 03/26/2022  PCP:  [name], [facility]  Cardiologist:  [name], MD  Advanced Practice Provider:  No care team member to display Electrophysiologist:  None     Chief Complaint    Gary [name] is a 77 y.o. 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   Past Surgical History:  Procedure Laterality Date   CARDIOVERSION N/A 12/06/2014   Procedure: CARDIOVERSION;  Surgeon: [name], MD;  Location: [place];  Service: Cardiovascular;  Laterality: N/A;   CARDIOVERSION N/A 05/26/2017   Procedure: CARDIOVERSION;  Surgeon: [name], MD;  Location: [place];  Service: Cardiovascular;  Laterality: N/A;   CARDIOVERSION N/A 04/25/2019   Procedure: CARDIOVERSION;  Surgeon: Sanda Klein, MD;  Location: [place] ENDOSCOPY;  Service: Cardiovascular;  Laterality: N/A;   CARDIOVERSION N/A 02/26/2022   Procedure: CARDIOVERSION;  Surgeon: [name], MD;  Location: [facility]OSCOPY;  Service: Cardiovascular;  Laterality: N/A;   [place]     multiple stents   CORONARY ARTERY BYPASS GRAFT  2006   CORONARY STENT INTERVENTION N/A 09/28/2021   Procedure: CORONARY STENT INTERVENTION;  Surgeon: Lorretta Harp, MD;  Location: [place] CV LAB;  Service: Cardiovascular;   Laterality: N/A;  SVG-PDA   EYE SURGERY     right eye-  muscular repair   LEFT HEART CATH AND CORS/GRAFTS ANGIOGRAPHY N/A 09/28/2021   Procedure: LEFT HEART CATH AND CORS/GRAFTS ANGIOGRAPHY;  Surgeon: Lorretta Harp, MD;  Location: [place] CV LAB;  Service: Cardiovascular;  Laterality: N/A;   TOTAL HIP ARTHROPLASTY  01/03/2012   Procedure: TOTAL HIP ARTHROPLASTY;  Surgeon: [name], MD;  Location: [facility];  Service: Orthopedics;  Laterality: Right;    Allergies  Allergies  Allergen Reactions   Statins Other (See Comments)    Muscle pain    History of Present Illness    Gary [name] is a 77 y.o. male with a hx of HTN, melanoma, DM2, GERD, CAD (2003 stent to distal RCA, 2004 stents to RCA and LAD, 2005 stent to prox mis and distal LAD, 2006 CABG, 2023 stent to SVG-RPDA), combined systolic and diastolic heart failure, HLD, PAF, OSA, Wegener's last seen by [facility].  Admitted 09/25/21 with chest pain radiating to throat and jaw. Echo 09/26/21 with LVEF 50-55%, no RWMA, gr2DD, normal PASP, calcification of aortic and mitral valbes without regurgitation nor stenosis. Underwent LHC 9/25 23 with patent LIMA-LAD and SVG-ramus. There was 99% occlusion of SVG-RPDA which was treated with DES. Recommended of raspirin, clopidogrel, coumadin for 30 days then discontinue aspirin. Clopidogrel to be continued for 12 months then transitioned to aspirin.   Saw Dr. Oval Linsey 02/03/22 with recurrent atrial fibrillation. He underwent scheduled cardioversion 02/26/22.  Admitted 2/26-03/06/22 for aspiration pneumonia secondary to intubation/DCCV as he was on Ozempic which he did not stop but did not want to delay cardioversion. Losartan and HCTZ stopped due to hypotension.  He saw Atrial Fib Clinic 03/22/22 and was in atrial fib. He wished to avoid Amiodarone due to Wegener's. Has been referred to EP to discuss tikosyn vs ablation.   Presents today for follow up. Discharge weight 235.6 lbs and  weight at home 236. HR at home 60s-80s. Will get lightheaded only if he makes position changes too quickly. Dr. Francesco Sor started an iron pill for anemia as well as a daily softener to help prevent constipation. Was able to mow yesterday but notes he took frequent breaks due to fatigue. He returned his Ozempic to Dr. Francesco Sor and does not plan to resume.   EKGs/Labs/Other Studies Reviewed:   The following studies were reviewed today:  Echo 09/26/21  1. Left ventricular ejection fraction, by estimation, is 50 to 55%. The  left ventricle has low normal function. The left ventricle has no regional  wall motion abnormalities. There is moderate left ventricular hypertrophy.  Left ventricular diastolic  parameters are consistent with Grade II diastolic dysfunction  (pseudonormalization).   2. Right ventricular systolic function is normal. The right ventricular  size is normal. There is normal pulmonary artery systolic pressure. The  estimated right ventricular systolic pressure is XX123456 mmHg.   3. No evidence of mitral valve regurgitation. Moderate to severe mitral  annular calcification.   4. There is mild calcification of the aortic valve. Aortic valve  regurgitation is not visualized. No aortic stenosis is present.   5. The inferior vena cava is normal in size with greater than 50%  respiratory variability, suggesting right atrial pressure of 3 mmHg.   LHC 09/28/21    Prox LAD to Mid LAD lesion is 100% stenosed.   Ost LAD to Prox LAD lesion is 100% stenosed.   Ramus lesion is 100% stenosed.   Prox RCA lesion is 100% stenosed.   Prox RCA to Dist RCA lesion is 100% stenosed.   Ost Cx to Prox Cx lesion is 30% stenosed.   Dist Graft to Insertion lesion is 99% stenosed.   A drug-eluting stent was successfully placed using a SYNERGY XD [id number].   Post intervention, there is a 0% residual stenosis.    Diagnostic Dominance: Right  Intervention    EKG:  EKG is ordered today.  The ekg ordered  today demonstrates NSR 64 bpm with LVH and improvement in TWI in inferior leads compared to previous.  Recent Labs: 03/01/2022: B Natriuretic Peptide 471.9 03/02/2022: ALT 30 03/06/2022: BUN 20; Creatinine, Ser 1.13; Hemoglobin 10.2; Platelets 466; Potassium 3.8; Sodium 139  Recent Lipid Panel    Component Value Date/Time   CHOL 155 05/26/2020 0807   TRIG 184 (H) 05/26/2020 0807   HDL 45 05/26/2020 0807   CHOLHDL 3.4 05/26/2020 0807   CHOLHDL 5.4 (H) 02/19/2015 0821   VLDL 22 02/19/2015 0821   LDLCALC 79 05/26/2020 0807    Risk Assessment/Calculations:   CHA2DS2-VASc Score = 6   This indicates a 9.7% annual risk of stroke. The patient's score is based upon: CHF History: 1 HTN History: 1 Diabetes History: 1 Stroke History: 0 Vascular Disease History: 1 Age Score: 2 Gender Score: 0     Home Medications   Current Meds  Medication Sig   ALPRAZolam (XANAX) 0.5 MG tablet Take 0.5 mg by mouth at bedtime.   calcium carbonate (OS-CAL) 600  MG TABS tablet Take 600 mg by mouth daily with breakfast.   carvedilol (COREG) 25 MG tablet Take 0.5 tablets (12.5 mg total) by mouth 2 (two) times daily.   Cholecalciferol (VITAMIN D3) 125 MCG (5000 UT) TABS Take 10,000 Units by mouth daily.   clopidogrel (PLAVIX) 75 MG tablet Take 1 tablet (75 mg total) by mouth daily with breakfast. Take every day for 1 year   Coenzyme Q10 (CO Q 10 PO) Take 400 mg by mouth daily.   levothyroxine (SYNTHROID, LEVOTHROID) 137 MCG tablet Take 137 mcg by mouth daily before breakfast.   loratadine (CLARITIN) 10 MG tablet Take 10 mg by mouth daily.   metFORMIN (GLUCOPHAGE) 500 MG tablet Take 500 mg by mouth 2 (two) times daily.   Multiple Vitamin (MULTIVITAMIN WITH MINERALS) TABS tablet Take 1 tablet by mouth daily. Centrum silver men   nitroGLYCERIN (NITROSTAT) 0.4 MG SL tablet Place 1 tablet (0.4 mg total) under the tongue every 5 (five) minutes x 3 doses as needed for chest pain.   pantoprazole (PROTONIX) 40 MG  tablet Take 40 mg by mouth 2 (two) times daily.   predniSONE (DELTASONE) 5 MG tablet Take 5 mg by mouth daily with breakfast.   REPATHA SURECLICK [id number] MG/ML SOAJ INJECT 140 MG INTO THE SKIN EVERY 14 (FOURTEEN) DAYS. INJECT 1 PEN INTO THE FATTY TISSUE SKIN EVERY 14 DAY   riTUXimab (RITUXAN IV) Inject 1 application  into the vein See admin instructions. Take every two weeks and every six months alternating   spironolactone (ALDACTONE) 25 MG tablet Take 0.5 tablets (12.5 mg total) by mouth daily.   testosterone cypionate (DEPOTESTOTERONE CYPIONATE) 100 MG/ML injection Inject 100 mg into the muscle every 14 (fourteen) days.   venlafaxine XR (EFFEXOR-XR) 150 MG 24 hr capsule Take 150 mg by mouth daily.    warfarin (COUMADIN) 5 MG tablet TAKE 1/2 TABLET TO 1 TABLET BY MOUTH DAILY AS DIRECTED BY COUMADIN CLINIC (Patient taking differently: Take 2.5-5 mg by mouth See admin instructions. Take 2.5 mg on Wednesday all the other days take 5 mg in the morning AS DIRECTED BY COUMADIN CLINIC)   Zinc Chelated 22.5 MG TABS Take 22.5 mg by mouth daily.     Review of Systems      All other systems reviewed and are otherwise negative except as noted above.  Physical Exam    VS:  BP 124/64   Pulse 80   Ht 5\' 10"  (1.778 m)   Wt 238 lb (108 kg)   BMI 34.15 kg/m  , BMI Body mass index is 34.15 kg/m.  Wt Readings from Last 3 Encounters:  03/26/22 238 lb (108 kg)  03/22/22 235 lb 3.2 oz (106.7 kg)  03/06/22 253 lb 4.8 oz (114.9 kg)     GEN: Well nourished, well developed, in no acute distress. HEENT: normal. Neck: Supple, no JVD, carotid bruits, or masses. Cardiac: IRIR, no murmurs, rubs, or gallops. No clubbing, cyanosis, edema.  Radials/PT 2+ and equal bilaterally.  Respiratory:  Respirations regular and unlabored, clear to auscultation bilaterally. GI: Soft, nontender, nondistended. MS: No deformity or atrophy. Skin: Warm and dry, no rash.  Neuro:  Strength and sensation are intact. Psych: Normal  affect.  Assessment & Plan    CAD s/p CABG and PCI- 09/28/21 DES to SVG-PDA. Continue Plavix, Repatha, Carvedilol. Heart healthy diet and regular cardiovascular exercise encouraged.    HLD, LDL goal <70 - 08/2021 LDL 75. Continue Repatha. Documented statin intolerance.   PAF / Hypercoagulable state -  Has been referred to EP to discuss tikosym vs ablation. Atrial fib symptomatic with fatigue. CHA2DS2-VASc Score = 6 [CHF History: 1, HTN History: 1, Diabetes History: 1, Stroke History: 0, Vascular Disease History: 1, Age Score: 2, Gender Score: 0].  Therefore, the patient's annual risk of stroke is 9.7 %.    Follows with Coumadin Clinic. Continue Coreg 25mg  BID, Warfarin. He is interested in transition back to Eliquis from Warfarin - given Wegener's disease will reach out to Dr. Oval Linsey and pharmacy team for input.   Combined systolic and diastolic heart failure with recovered LVEF - 09/2021 EF 50-55%, gr2DD. Euvolemic and well compensated on exam. GDMT  Carvedilol. Losartan stopped during recent admission due to hypotension. No indication for loop diuretic at this time. Rx Farxiga 10mg  QD. BMP in 1-2 weeks.  Heart healthy diet and regular cardiovascular exercise encouraged.    HTN - BP well controlled. Continue current antihypertensive regimen.    OSA -  CPAP compliance encouraged.   GERD - Continue to follow with PCP.   DM2 - Continue to follow with PCP.          Disposition: Follow up in 3 month(s) with Loel Dubonnet, NP and in 6-8 months [name], MD or APP.  Signed, Loel Dubonnet, NP 03/26/2022, 3:41 PM [place]

## 2022-03-29 ENCOUNTER — Encounter (HOSPITAL_BASED_OUTPATIENT_CLINIC_OR_DEPARTMENT_OTHER): Payer: Self-pay | Admitting: Family

## 2022-03-30 ENCOUNTER — Telehealth (HOSPITAL_BASED_OUTPATIENT_CLINIC_OR_DEPARTMENT_OTHER): Payer: Self-pay

## 2022-03-30 ENCOUNTER — Other Ambulatory Visit (HOSPITAL_COMMUNITY): Payer: Self-pay

## 2022-03-30 ENCOUNTER — Telehealth: Payer: Self-pay

## 2022-03-30 NOTE — Progress Notes (Signed)
I can't find anything to suggest that he can't be on Eliquis.

## 2022-03-30 NOTE — Telephone Encounter (Signed)
-----   Message from Maralyn Sago, CPhT sent at 03/30/2022 12:28 PM EDT ----- Arby Barrette,                     I checked in on this and no p/a is required for farxiga for a 90day supply. Copay is 0.00 for a 90day supply  ----- Message ----- From: Gerald Stabs, RN Sent: 03/26/2022   4:10 PM EDT To: Rx Prior Auth Team  Patient started today on Farxiga 10mg  daily in clinic, may need PA! Thanks!

## 2022-04-02 ENCOUNTER — Ambulatory Visit: Payer: Medicare Other | Attending: Cardiovascular Disease | Admitting: *Deleted

## 2022-04-02 ENCOUNTER — Encounter (HOSPITAL_BASED_OUTPATIENT_CLINIC_OR_DEPARTMENT_OTHER): Payer: Self-pay

## 2022-04-02 DIAGNOSIS — Z7901 Long term (current) use of anticoagulants: Secondary | ICD-10-CM | POA: Diagnosis not present

## 2022-04-02 DIAGNOSIS — I480 Paroxysmal atrial fibrillation: Secondary | ICD-10-CM | POA: Diagnosis not present

## 2022-04-02 LAB — POCT INR: POC INR: 2.7

## 2022-04-02 NOTE — Patient Instructions (Signed)
Description   Continue taking warfarin 1 tablet daily, except 1/2 tablet Monday, Wednesday and Friday. Recheck INR in 6 weeks.  Please call us at [phone] with any questions.

## 2022-04-06 ENCOUNTER — Ambulatory Visit: Payer: Self-pay

## 2022-04-07 ENCOUNTER — Telehealth: Payer: Self-pay | Admitting: Family

## 2022-04-07 ENCOUNTER — Ambulatory Visit (HOSPITAL_BASED_OUTPATIENT_CLINIC_OR_DEPARTMENT_OTHER): Payer: Medicare Other

## 2022-04-07 DIAGNOSIS — I480 Paroxysmal atrial fibrillation: Secondary | ICD-10-CM

## 2022-04-07 DIAGNOSIS — Z8701 Personal history of pneumonia (recurrent): Secondary | ICD-10-CM

## 2022-04-07 DIAGNOSIS — J189 Pneumonia, unspecified organism: Secondary | ICD-10-CM | POA: Diagnosis not present

## 2022-04-07 NOTE — Telephone Encounter (Signed)
Okay to remove [place] from med list.   Loel Dubonnet, NP

## 2022-04-07 NOTE — Patient Outreach (Signed)
  Care Coordination   Follow Up Visit Note   04/06/2022 Name: Gary [name] MRN: [id number] DOB: [birth year]  Gary [name] is a 77 y.o. year old male who sees [name], [place] for primary care. I spoke with  [name] by phone today.  What matters to the patients health and wellness today?  Patient will report symptoms of fatigue, low BP and dizziness to his Cardiologist at upcoming scheduled appointment. He will increase his water intake to 48-64 oz daily.     Goals Addressed             This Visit's Progress    RN Care Coordination Activities: further follow up required       Care Coordination Interventions: Evaluation of current treatment plan related to aspirational pneumonia and patient's adherence to plan as established by provider Determined patient continues to experience and intermittent cough, he reports dizziness, low BP and fatigue Reviewed and discussed upcoming PCP visit scheduled for 424/24 @ 8:00 AM Encouraged patient to discuss his ongoing symptoms and concerns with PCP and to ask for a repeat chest xray Routed not to PCP provider Dr. Francesco Sor with update regarding patient's symptoms     To have evaluation for treatment of A-fib       Care Coordination Interventions: Reviewed medications with patient and discussed importance of medication adherence Reviewed importance of adherence to anticoagulant exactly as prescribed Determined patient is experiencing increased fatigue, dizziness and low BP since starting Farxiga Educated patient on the indication, dosage and frequency of this medication Educated patient on the potential for dehydration leading to SE including those he is experiencing Educated patient on the importance of staying well hydrated with water aiming for 48-64 oz daily unless otherwise directed  Advised patient to discuss potential SE to [place] with provider Counseled on importance of regular laboratory monitoring as prescribed Afib  action plan reviewed Sent in basket message to Laurann Montana NP, Cardiology provider and Marisue Brooklyn Tipps CPhT advising of potential SE to [place] upcoming scheduled appointment with Laurann Montana NP set for 04/12/22 @12  PM       Interventions Today    Flowsheet Row Most Recent Value  Chronic Disease   Chronic disease during today's visit Atrial Fibrillation (AFib), Other  [s/p aspirational pneumonia]  General Interventions   General Interventions Discussed/Reviewed General Interventions Discussed, General Interventions Reviewed, Doctor Visits  Doctor Visits Discussed/Reviewed Doctor Visits Reviewed, Doctor Visits Discussed, PCP, Specialist  Education Interventions   Education Provided Provided Education  Provided Verbal Education On Medication, When to see the doctor  Nutrition Interventions   Nutrition Discussed/Reviewed Fluid intake  Pharmacy Interventions   Pharmacy Dicussed/Reviewed Medications and their functions, Pharmacy Topics Discussed, Referral to Pharmacist  Referral to Pharmacist Drug interaction/side effects          SDOH assessments and interventions completed:  No     Care Coordination Interventions:  Yes, provided   Follow up plan: Follow up call scheduled for 04/16/22 @09 :30 AM    Encounter Outcome:  Pt. Visit Completed

## 2022-04-07 NOTE — Telephone Encounter (Signed)
Spoke with patient and he is discontinuing, does not want to continue Has had multiple issues with this medication including being lightheaded and SBP in the 90's  Also would like to have follow up chest xray done   Discussed with Dr Oval Linsey, ok to order chest xray

## 2022-04-07 NOTE — Telephone Encounter (Signed)
Pt c/o medication issue:  1. Name of Medication:   dapagliflozin propanediol (FARXIGA) 10 MG TABS tablet    2. How are you currently taking this medication (dosage and times per day)? Take 1 tablet (10 mg total) by mouth daily before breakfast.   3. Are you having a reaction (difficulty breathing--STAT)? No  4. What is your medication issue?  Pt states that since starting medication he has been dizzy as well as BP being low. He would like a callback regarding this matter.

## 2022-04-07 NOTE — Patient Instructions (Signed)
Visit Information  Thank you for taking time to visit with me today. Please don't hesitate to contact me if I can be of assistance to you.   Following are the goals we discussed today:   Goals Addressed             This Visit's Progress    RN Care Coordination Activities: further follow up required       Care Coordination Interventions: Evaluation of current treatment plan related to aspirational pneumonia and patient's adherence to plan as established by provider Determined patient continues to experience and intermittent cough, he reports dizziness, low BP and fatigue Reviewed and discussed upcoming PCP visit scheduled for 424/24 @ 8:00 AM Encouraged patient to discuss his ongoing symptoms and concerns with PCP and to ask for a repeat chest xray Routed not to PCP provider Dr. Francesco Sor with update regarding patient's symptoms      To have evaluation for treatment of A-fib       Care Coordination Interventions: Reviewed medications with patient and discussed importance of medication adherence Reviewed importance of adherence to anticoagulant exactly as prescribed Determined patient is experiencing increased fatigue, dizziness and low BP since starting Farxiga Educated patient on the indication, dosage and frequency of this medication Educated patient on the potential for dehydration leading to SE including those he is experiencing Educated patient on the importance of staying well hydrated with water aiming for 48-64 oz daily unless otherwise directed  Advised patient to discuss potential SE to [place] with provider Counseled on importance of regular laboratory monitoring as prescribed Afib action plan reviewed Sent in basket message to Laurann Montana NP, Cardiology provider and Marisue Brooklyn Tipps CPhT advising of potential SE to [place] upcoming scheduled appointment with Laurann Montana NP set for 04/12/22 @12  PM           Our next appointment is by telephone on 04/16/22 at  09:30 AM  Please call the care guide team at [phone] if you need to cancel or reschedule your appointment.   If you are experiencing a Mental Health or [place] or need someone to talk to, please call 1-800-273-TALK (toll free, 24 hour hotline) go to [facility] [address] [place] [phone])  Patient verbalizes understanding of instructions and care plan provided today and agrees to view in [place]. Active MyChart status and patient understanding of how to access instructions and care plan via MyChart confirmed with patient.     Barb Merino, RN, BSN, CCM Care Management Coordinator [facility]  Direct Phone: [phone]

## 2022-04-12 ENCOUNTER — Encounter: Payer: Self-pay | Admitting: Cardiology

## 2022-04-12 ENCOUNTER — Ambulatory Visit: Payer: Medicare Other | Attending: Cardiology | Admitting: Cardiology

## 2022-04-12 VITALS — BP 110/70 | HR 82 | Ht 70.0 in | Wt 238.0 lb

## 2022-04-12 DIAGNOSIS — I2510 Atherosclerotic heart disease of native coronary artery without angina pectoris: Secondary | ICD-10-CM | POA: Diagnosis not present

## 2022-04-12 DIAGNOSIS — D6869 Other thrombophilia: Secondary | ICD-10-CM

## 2022-04-12 DIAGNOSIS — G4733 Obstructive sleep apnea (adult) (pediatric): Secondary | ICD-10-CM

## 2022-04-12 DIAGNOSIS — I4819 Other persistent atrial fibrillation: Secondary | ICD-10-CM | POA: Diagnosis not present

## 2022-04-12 DIAGNOSIS — I10 Essential (primary) hypertension: Secondary | ICD-10-CM

## 2022-04-12 NOTE — Progress Notes (Signed)
Electrophysiology Office Note   Date:  04/12/2022   ID:  Gary [name], DOB 09/05/1945, MRN [id number]  PCP:  Charlane FerrettiSkakle, [place], DO  Cardiologist: Duke Salviaandolph Primary Electrophysiologist:  Tayten Bergdoll Jorja LoaMartin Daisey Caloca, MD    Chief Complaint: Atrial fibrillation   History of Present Illness: Gary [name] is a 77 y.o. male who is being seen today for the evaluation of atrial fibrillation at the request of Charlane FerrettiSkakle, [place], DO. Presenting today for electrophysiology evaluation.  He has a history of significant hypertension, melanoma, type 2 diabetes, coronary artery disease status post CABG with multiple stents, chronic systolic heart failure, atrial fibrillation, Wegener's.  He was admitted to the hospital 09/25/2021.  He had a 99% occlusion of the SVG to PDA which was stented.  Echo showed a normal ejection fraction at the time.  He was then found to have recurrent atrial fibrillation.  He was scheduled for cardioversion 02/26/2022 but was then admitted for aspiration pneumonia requiring intubation.  He was on Ozempic and did not stop the medication but did not want to delay cardioversion.  He is unfortunately gone back into atrial fibrillation.  He is feels fatigued and short of breath.  He went to get back into normal rhythm.  Today, he denies symptoms of palpitations, chest pain, orthopnea, PND, lower extremity edema, claudication, dizziness, presyncope, syncope, bleeding, or neurologic sequela. The patient is tolerating medications without difficulties.    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   Past Surgical History:  Procedure Laterality Date   CARDIOVERSION N/A 12/06/2014   Procedure: CARDIOVERSION;  Surgeon: Chilton Siiffany [place], MD;  [facility];  Service: Cardiovascular;  Laterality: N/A;   CARDIOVERSION N/A 05/26/2017   Procedure: CARDIOVERSION;  Surgeon: Chilton Siandolph, Tiffany, MD;  [facility];  Service: Cardiovascular;  Laterality: N/A;   CARDIOVERSION N/A 04/25/2019   Procedure: CARDIOVERSION;  Surgeon: Thurmon Fairroitoru, Mihai, MD;  Location: [facility] ENDOSCOPY;  Service: Cardiovascular;  Laterality: N/A;   CARDIOVERSION N/A 02/26/2022   Procedure: CARDIOVERSION;  Surgeon: Chilton Siandolph, Tiffany, MD;  [facility];  Service: Cardiovascular;  Laterality: N/A;   CARPAL TUNNEL RELEASE     CORONARY ANGIOPLASTY     multiple stents   CORONARY ARTERY BYPASS GRAFT  2006   CORONARY STENT INTERVENTION N/A 09/28/2021   Procedure: CORONARY STENT INTERVENTION;  Surgeon: Runell GessBerry, Jonathan J, MD;  Location: [facility] INVASIVE CV LAB;  Service: Cardiovascular;  Laterality: N/A;  SVG-PDA   EYE SURGERY     right eye-  muscular repair   LEFT HEART CATH AND CORS/GRAFTS ANGIOGRAPHY N/A 09/28/2021   Procedure: LEFT HEART CATH AND CORS/GRAFTS ANGIOGRAPHY;  Surgeon: Runell GessBerry, Jonathan J, MD;  Location: [facility] INVASIVE CV LAB;  Service: Cardiovascular;  Laterality: N/A;   TOTAL HIP ARTHROPLASTY  01/03/2012   Procedure: TOTAL HIP ARTHROPLASTY;  Surgeon: Jacki Conesonald A Gioffre, MD;  Location: [facility] ORS;  Service: Orthopedics;  Laterality: Right;     Current Outpatient Medications  Medication Sig Dispense Refill   ALPRAZolam (XANAX) 0.5  MG tablet Take 0.5 mg by mouth at bedtime.     calcium carbonate (OS-CAL) 600 MG TABS tablet Take 600 mg by mouth daily with breakfast.     carvedilol (COREG) 25 MG tablet Take 0.5 tablets (12.5 mg total) by mouth 2 (two) times daily.     Cholecalciferol (VITAMIN D3) 125 MCG (5000 UT) TABS Take 10,000 Units by mouth daily.      clopidogrel (PLAVIX) 75 MG tablet Take 1 tablet (75 mg total) by mouth daily with breakfast. Take every day for 1 year 90 tablet 3   Coenzyme Q10 (CO Q 10 PO) Take 400 mg by mouth daily.     levothyroxine (SYNTHROID, LEVOTHROID) 137 MCG tablet Take 137 mcg by mouth daily before breakfast.     loratadine (CLARITIN) 10 MG tablet Take 10 mg by mouth daily.     metFORMIN (GLUCOPHAGE) 500 MG tablet Take 500 mg by mouth 2 (two) times daily.  12   Multiple Vitamin (MULTIVITAMIN WITH MINERALS) TABS tablet Take 1 tablet by mouth daily. Centrum silver men     nitroGLYCERIN (NITROSTAT) 0.4 MG SL tablet Place 1 tablet (0.4 mg total) under the tongue every 5 (five) minutes x 3 doses as needed for chest pain. 25 tablet 12   pantoprazole (PROTONIX) 40 MG tablet Take 40 mg by mouth 2 (two) times daily.     predniSONE (DELTASONE) 5 MG tablet Take 5 mg by mouth daily with breakfast.     REPATHA SURECLICK 140 MG/ML SOAJ INJECT 140 MG INTO THE SKIN EVERY 14 (FOURTEEN) DAYS. INJECT 1 PEN INTO THE FATTY TISSUE SKIN EVERY 14 DAY 6 mL 3   riTUXimab (RITUXAN IV) Inject 1 application  into the vein See admin instructions. Take every two weeks and every six months alternating     spironolactone (ALDACTONE) 25 MG tablet Take 0.5 tablets (12.5 mg total) by mouth daily. 30 tablet 0   testosterone cypionate (DEPOTESTOTERONE CYPIONATE) 100 MG/ML injection Inject 100 mg into the muscle every 14 (fourteen) days.     venlafaxine XR (EFFEXOR-XR) 150 MG 24 hr capsule Take 150 mg by mouth daily.      warfarin (COUMADIN) 5 MG tablet TAKE 1/2 TABLET TO 1 TABLET BY MOUTH DAILY AS DIRECTED BY COUMADIN CLINIC (Patient taking differently: Take 2.5-5 mg by mouth See admin instructions. Take 2.5 mg on Wednesday all the other days take 5 mg in the morning AS DIRECTED BY COUMADIN CLINIC) 75 tablet 0   Zinc Chelated 22.5 MG TABS Take 22.5 mg by mouth daily.     No current facility-administered medications for this visit.    Allergies:    Farxiga [dapagliflozin] and Statins   Social History:  The patient  reports that he quit smoking about 38 years ago. His smoking use included cigarettes. He has never used smokeless tobacco. He reports that he does not drink alcohol and does not use drugs.   Family History:  The patient's family history includes Heart attack in his maternal grandfather, mother, paternal grandfather, and paternal grandmother; Other in his father; Stroke in his maternal grandmother.    ROS:  Please see the history of present illness.   Otherwise, review of systems is positive for none.   All other systems are reviewed and negative.    PHYSICAL EXAM: VS:  BP 110/70   Pulse 82   Ht 5\' 10"  (1.778 m)   Wt 238 lb (108 kg)   SpO2 91%   BMI 34.15 kg/m  , BMI Body  mass index is 34.15 kg/m. GEN: Well nourished, well developed, in no acute distress  HEENT: normal  Neck: no JVD, carotid bruits, or masses Cardiac: Irregular ; no murmurs, rubs, or gallops,no edema  Respiratory:  clear to auscultation bilaterally, normal work of breathing GI: soft, nontender, nondistended, + BS MS: no deformity or atrophy  Skin: warm and dry neuro:  Strength and sensation are intact Psych: euthymic mood, full affect  EKG:  EKG is not ordered today. Personal review of the ekg ordered 03/22/22 shows atrial fibrillation  Recent Labs: 03/01/2022: B Natriuretic Peptide 471.9 03/02/2022: ALT 30 03/06/2022: BUN 20; Creatinine, Ser 1.13; Hemoglobin 10.2; Platelets 466; Potassium 3.8; Sodium 139    Lipid Panel     Component Value Date/Time   CHOL 155 05/26/2020 0807   TRIG 184 (H) 05/26/2020 0807   HDL 45 05/26/2020 0807   CHOLHDL 3.4 05/26/2020 0807   CHOLHDL 5.4 (H) 02/19/2015 0821   VLDL 22 02/19/2015 0821   LDLCALC 79 05/26/2020 0807     Wt Readings from Last 3 Encounters:  04/12/22 238 lb (108 kg)  03/26/22 238 lb (108 kg)  03/22/22 235 lb 3.2 oz (106.7 kg)      Other studies Reviewed: Additional studies/ records  that were reviewed today include: TTE 03/02/22  Review of the above records today demonstrates:   1. Left ventricular ejection fraction, by estimation, is 50%. The left  ventricle has mildly decreased function. The left ventricle demonstrates  global hypokinesis. There is moderate concentric left ventricular  hypertrophy. Left ventricular diastolic  parameters are consistent with Grade II diastolic dysfunction  (pseudonormalization). Elevated left atrial pressure.   2. Right ventricular systolic function is mildly reduced. The right  ventricular size is normal. There is mildly elevated pulmonary artery  systolic pressure. The estimated right ventricular systolic pressure is  35.5 mmHg.   3. Left atrial size was severely dilated.   4. The mitral valve is normal in structure. Trivial mitral valve  regurgitation. No evidence of mitral stenosis. Moderate mitral annular  calcification.   5. The aortic valve is tricuspid. There is mild calcification of the  aortic valve. There is mild thickening of the aortic valve. Aortic valve  regurgitation is not visualized. Aortic valve sclerosis/calcification is  present, without any evidence of  aortic stenosis.   6. The inferior vena cava is normal in size with greater than 50%  respiratory variability, suggesting right atrial pressure of 3 mmHg.   LHC 09/28/21   Prox LAD to Mid LAD lesion is 100% stenosed.   Ost LAD to Prox LAD lesion is 100% stenosed.   Ramus lesion is 100% stenosed.   Prox RCA lesion is 100% stenosed.   Prox RCA to Dist RCA lesion is 100% stenosed.   Ost Cx to Prox Cx lesion is 30% stenosed.   Dist Graft to Insertion lesion is 99% stenosed.   A drug-eluting stent was successfully placed using a SYNERGY XD [id number].   Post intervention, there is a 0% residual stenosis.  ASSESSMENT AND PLAN:  1.  Persistent atrial fibrillation: CHA2DS2-VASc of 6.  Currently on warfarin.  He would like to get back into normal rhythm.  Due to his  multiple medical issues, I feel that initial management strategy of dofetilide would be most beneficial.  Aryannah Mohon plan for admission to the hospital.  2.  Coronary artery disease: Multiple PCI's and status post CABG.  No current chest pain.  3.  Chronic combined systolic and diastolic heart failure: Ejection  fraction has recovered.  Continue medications per primary cardiology.  4.  Obstructive sleep apnea: CPAP compliance encouraged  5.  Hypertension: Currently well-controlled continue with management per primary cardiology.  6.  Secondary hypercoagulable state: Currently on warfarin for atrial fibrillation  Current medicines are reviewed at length with the patient today.   The patient does not have concerns regarding his medicines.  The following changes were made today:  none  Labs/ tests ordered today include:  No orders of the defined types were placed in this encounter.    Disposition:   FU with Jeremiah Curci 3 months  Signed, Darrelle Wiberg Jorja Loa, MD  04/12/2022 1:33 PM     [facility] [address] [place] [address] [phone] (office) [phone] (fax)

## 2022-04-12 NOTE — Patient Instructions (Addendum)
Medication Instructions:  Your physician recommends that you continue on your current medications as directed. Please refer to the Current Medication list given to you today.  *If you need a refill on your cardiac medications before your next appointment, please call your pharmacy*   Lab Work: None ordered  Testing/Procedures: None ordered   Follow-Up: At [facility], you and your health needs are our priority.  As part of our continuing mission to provide you with exceptional heart care, we have created designated Provider Care Teams.  These Care Teams include your primary Cardiologist (physician) and Advanced Practice Providers (APPs -  Physician Assistants and Nurse Practitioners) who all work together to provide you with the care you need, when you need it.   Your next appointment:   To be  determined  The format for your next appointment:   In Person  Provider:   Loman Brooklyn, MD    Thank you for choosing [facility]!!   Dory Horn, RN [phone]  Other Instructions  Dofetilide (Tikosyn) Admission  Prior to day of admission: Check with drug insurance company for cost of drug to ensure affordability --- Dofetilide 500 mcg twice a day.  GoodRx is an option if insurance copay is unaffordable.  A pharmacist will review all your medications for potential interactions with Tikosyn. If any medication changes are needed prior to admission we will be in touch with you.  If any new medications are started AFTER your admission date is set with Kennyth Arnold RN [phone]). Please notify our office immediately so your medication list can be updated and reviewed by our pharmacist again.  No Benadryl is allowed 3 days prior to admission.  Please ensure no missed doses of your anticoagulation (blood thinner) for 3 weeks prior to admission. If a dose is missed please notify our office immediately.  Tikosyn initiation requires a 3 night/4 day hospital stay with constant  telemetry monitoring. You will have an EKG after each dose of Tikosyn as well as daily lab draws. On day of admission: [facility] Clinic office visit on the morning of admission is needed for preliminary labs/ekg.  You may bring personal belongings/clothing with you to the hospital. Please leave your suitcase in the car until you arrive in admissions.  Time of admission is dependent on bed availability in the hospital. In some instances, you will be sent home until bed is available. Rarely admission can be delayed to the following day if hospital census prevents available beds.  If the drug does not convert you to normal rhythm a cardioversion after the 4th dose of Tikosyn.  Questions please call our office at [phone]    Dofetilide Capsules What is this medication? DOFETILIDE (doe FET il ide) treats a fast or irregular heartbeat (arrhythmia). It works by slowing down overactive electric signals in the heart, which stabilizes your heart rhythm. It belongs to a group of medications called antiarrhythmics. This medicine may be used for other purposes; ask your health care provider or pharmacist if you have questions. COMMON BRAND NAME(S): Tikosyn What should I tell my care team before I take this medication? They need to know if you have any of these conditions: Heart disease History of irregular heartbeat History of low levels of potassium or magnesium in the blood Kidney disease Liver disease An unusual or allergic reaction to dofetilide, other medications, foods, dyes, or preservatives Pregnant or trying to get pregnant Breast-feeding How should I use this medication? Take this medication by mouth with a glass of  water. Follow the directions on the prescription label. Do not take with grapefruit juice. You can take it with or without food. If it upsets your stomach, take it with food. Take your medication at regular intervals. Do not take it more often than directed. Do not stop taking  except on your care team's advice. A special MedGuide will be given to you by the pharmacist with each prescription and refill. Be sure to read this information carefully each time. Talk to your care team about the use of this medication in children. Special care may be needed. Overdosage: If you think you have taken too much of this medicine contact a poison control center or emergency room at once. NOTE: This medicine is only for you. Do not share this medicine with others. What if I miss a dose? If you miss a dose, skip it. Take your next dose at the normal time. Do not take extra or 2 doses at the same time to make up for the missed dose. What may interact with this medication? Do not take this medication with any of the following: Cimetidine Cisapride Dolutegravir Dronedarone Erdafitinib Hydrochlorothiazide Ketoconazole Megestrol Pimozide Prochlorperazine Thioridazine Trimethoprim Verapamil This medication may also interact with the following: Amiloride Cannabinoids Certain antibiotics like erythromycin or clarithromycin Certain antiviral medications for HIV or hepatitis Certain medications for depression, anxiety, or psychotic disorders Digoxin Diltiazem Grapefruit juice Metformin Nefazodone Other medications that prolong the QT interval (an abnormal heart rhythm) Quinine Triamterene Zafirlukast Ziprasidone This list may not describe all possible interactions. Give your health care provider a list of all the medicines, herbs, non-prescription drugs, or dietary supplements you use. Also tell them if you smoke, drink alcohol, or use illegal drugs. Some items may interact with your medicine. What should I watch for while using this medication? Your condition will be monitored carefully while you are receiving this medication. What side effects may I notice from receiving this medication? Side effects that you should report to your care team as soon as possible: Allergic  reactions--skin rash, itching, hives, swelling of the face, lips, tongue, or throat Chest pain Heart rhythm changes--fast or irregular heartbeat, dizziness, feeling faint or lightheaded, chest pain, trouble breathing Side effects that usually do not require medical attention (report to your care team if they continue or are bothersome): Dizziness Headache Nausea Stomach pain Trouble sleeping This list may not describe all possible side effects. Call your doctor for medical advice about side effects. You may report side effects to FDA at 1-800-FDA-1088. Where should I keep my medication? Keep out of the reach of children. Store at room temperature between 15 and 30 degrees C (59 and 86 degrees F). Throw away any unused medication after the expiration date. NOTE: This sheet is a summary. It may not cover all possible information. If you have questions about this medicine, talk to your doctor, pharmacist, or health care provider.  2023 Elsevier/Gold Standard (2020-07-01 00:00:00)

## 2022-04-14 ENCOUNTER — Encounter (HOSPITAL_BASED_OUTPATIENT_CLINIC_OR_DEPARTMENT_OTHER): Payer: Self-pay

## 2022-04-14 ENCOUNTER — Telehealth: Payer: Self-pay | Admitting: Pharmacist

## 2022-04-14 NOTE — Telephone Encounter (Signed)
Medication list reviewed in anticipation of upcoming Tikosyn initiation. Patient is not taking any contraindicated medications. Does take alprazolam and metformin which can increase concentrations of Tikosyn but ok to continue both.  Patient is anticoagulated on warfarin. Pt will require 4 weekly therapeutic INR checks prior to Tikosyn start.  Patient will need to be counseled to avoid use of Benadryl while on Tikosyn and in the 2-3 days prior to Tikosyn initiation.

## 2022-04-15 ENCOUNTER — Telehealth: Payer: Self-pay | Admitting: *Deleted

## 2022-04-15 NOTE — Telephone Encounter (Signed)
-----   Message from Shona Simpson, RN sent at 04/15/2022  9:36 AM EDT ----- Regarding: weekly INRs for tikosyn admit Pt is interested in going into the hospital for tikosyn admission - please contact pt to start process for weekly INRs. Thanks Radio producer [facility]

## 2022-04-15 NOTE — Telephone Encounter (Signed)
Called pt since receiving a message that he is interested/planning on tikosyn admission. Spoke with pt and advised that I am calling to set up weekly INR checks for his upcoming Tikosyn admit. We set up the first INR check on next week 4/17 at 1145am. He was thankful for the call and confirmed appt for next week. Also, placed a note on appointment regarding pending tikosyn admit/weekly checks.

## 2022-04-16 ENCOUNTER — Ambulatory Visit: Payer: Self-pay

## 2022-04-16 NOTE — Patient Outreach (Signed)
Care Coordination   Follow Up Visit Note   04/16/2022 Name: Gary [name] MRN: [id number] DOB: [birth year]  Gary [name] is a 77 y.o. year old male who sees Gary [name], [place] for primary care. I spoke with  Gary [name] by phone today.  What matters to the patients health and wellness today?  Patient will follow up with the Coumadin clinic as directed. Patient will continue to monitor his BP at home and report abnormal readings or concerns to his doctor.     Goals Addressed             This Visit's Progress    RN Care Coordination Activities: further follow up required       Care Coordination Interventions: Evaluation of current treatment plan related to aspirational pneumonia and patient's adherence to plan as established by provider Review of patient status, including review of consultant's reports, relevant laboratory and other test results, and medications completed Determined patient has not received a call from his provider to review his chest xray  Determined this test was ordered by Cardiology, sent an in basket message to Gillian Shields NP with patient's request to review his chest xray results  Determined patient's symptoms have improved and his cough has nearly subsided although he may cough with exertional activity or longer distance walking  Reviewed scheduled/upcoming provider appointment including: next PCP follow up appointment scheduled for 04/28/22 @08 :00 AM     To have evaluation for treatment of A-fib       Care Coordination Interventions: Evaluation of current treatment plan related to persistent Atrial Fib  and patient's adherence to plan as established by provider Reviewed and discussed recent electrophysiology evaluation Reviewed and discussed with patient the following recommendations; In preparation of starting Tikosyn - you will need 4 weekly (within normal range) INR checks before admission;  the coumadin clinic will be notified you are ready to  start weekly INRs - once your first INR is completed, a tentative date scheduled for your admission Determined patient verbalizes understanding of his treatment plan regarding Tikosyn          To improve lower extremity edema while maintaining a good BP       Care Coordination Interventions: Evaluation of current treatment plan related to hypertension self management and patient's adherence to plan as established by provider Reviewed medications with patient and discussed importance of compliance Reviewed and discussed with patient his concerns regarding increased lower extremity edema  Reviewed and discussed with patient his communication with Gillian Shields NP regarding his HCTZ and BP Advised patient of the following NP recommendations;  The Hydrochlorothiazide was discontinued to prevent low blood pressures. The half tablet of Spironolactone you are currently taking does help some with swelling.  I've listed some general tips and tricks for preventing swelling below. If it is persistently bothersome, if you will please check BP once per day and send [place] a log of 5-7 days of readings. That can help [place] best assess whether to adjust medications.   To prevent or reduce lower extremity swelling: Eat a low salt diet. Salt makes the body hold onto extra fluid which causes swelling. Sit with legs elevated. For example, in the recliner or on an ottoman.  Wear knee-high compression stockings during the daytime. Ones labeled 15-20 mmHg provide g Advised patient, providing education and rationale, to monitor blood pressure daily and record, calling PCP for findings outside established parameters Provided education on prescribed diet low Sodium Sent in basket message  to Gillian Shields NP to advise patient has restarted his HCTZ, provided patient recorded BP readings and requested patient be notified of his recent chest xray results        Interventions Today    Flowsheet Row Most Recent Value   Chronic Disease   Chronic disease during today's visit Hypertension (HTN)  [lower extremity edema]  General Interventions   General Interventions Discussed/Reviewed General Interventions Discussed, General Interventions Reviewed, Doctor Visits, Communication with  Doctor Visits Discussed/Reviewed Doctor Visits Discussed, Doctor Visits Reviewed, PCP, Specialist  Communication with PCP/Specialists, RN  Gillian Shields NP]  Education Interventions   Education Provided Provided Education  Provided Verbal Education On Nutrition  Nutrition Interventions   Nutrition Discussed/Reviewed Fluid intake, Decreasing salt  Pharmacy Interventions   Pharmacy Dicussed/Reviewed Medications and their functions, Pharmacy Topics Discussed, Pharmacy Topics Reviewed, Medication Adherence          SDOH assessments and interventions completed:  No     Care Coordination Interventions:  Yes, provided   Follow up plan: Follow up call scheduled for 05/14/22 @1 :00 PM    Encounter Outcome:  Pt. Visit Completed

## 2022-04-16 NOTE — Patient Instructions (Signed)
Visit Information  Thank you for taking time to visit with me today. Please don't hesitate to contact me if I can be of assistance to you.   Following are the goals we discussed today:   Goals Addressed             This Visit's Progress    RN Care Coordination Activities: further follow up required       Care Coordination Interventions: Evaluation of current treatment plan related to aspirational pneumonia and patient's adherence to plan as established by provider Review of patient status, including review of consultant's reports, relevant laboratory and other test results, and medications completed Determined patient has not received a call from his provider to review his chest xray  Determined this test was ordered by Cardiology, sent an in basket message to Gillian Shields NP with patient's request to review his chest xray results  Determined patient's symptoms have improved and his cough has nearly subsided although he may cough with exertional activity or longer distance walking  Reviewed scheduled/upcoming provider appointment including: next PCP follow up appointment scheduled for 04/28/22 @08 :00 AM     To have evaluation for treatment of A-fib       Care Coordination Interventions: Evaluation of current treatment plan related to persistent Atrial Fib  and patient's adherence to plan as established by provider Reviewed and discussed recent electrophysiology evaluation Reviewed and discussed with patient the following recommendations; In preparation of starting Tikosyn - you will need 4 weekly (within normal range) INR checks before admission;  the coumadin clinic will be notified you are ready to start weekly INRs - once your first INR is completed, a tentative date scheduled for your admission Determined patient verbalizes understanding of his treatment plan regarding Tikosyn      To improve lower extremity edema while maintaining a good BP       Care Coordination  Interventions: Evaluation of current treatment plan related to hypertension self management and patient's adherence to plan as established by provider Reviewed medications with patient and discussed importance of compliance Reviewed and discussed with patient his concerns regarding increased lower extremity edema  Reviewed and discussed with patient his communication with Gillian Shields NP regarding his HCTZ and BP Advised patient of the following NP recommendations;  The Hydrochlorothiazide was discontinued to prevent low blood pressures. The half tablet of Spironolactone you are currently taking does help some with swelling.  I've listed some general tips and tricks for preventing swelling below. If it is persistently bothersome, if you will please check BP once per day and send [place] a log of 5-7 days of readings. That can help [place] best assess whether to adjust medications.   To prevent or reduce lower extremity swelling: Eat a low salt diet. Salt makes the body hold onto extra fluid which causes swelling. Sit with legs elevated. For example, in the recliner or on an ottoman.  Wear knee-high compression stockings during the daytime. Ones labeled 15-20 mmHg provide g Advised patient, providing education and rationale, to monitor blood pressure daily and record, calling PCP for findings outside established parameters Provided education on prescribed diet low Sodium Sent in basket message to Gillian Shields NP to advise patient has restarted his HCTZ, provided patient recorded BP readings and requested patient be notified of his recent chest xray results        Our next appointment is by telephone on 05/14/22 at 1:00 PM  Please call the care guide team at [phone] if you need  to cancel or reschedule your appointment.   If you are experiencing a Mental Health or Behavioral Health Crisis or need someone to talk to, please call 1-800-273-TALK (toll free, 24 hour hotline) go to [facility] [address] [place] [phone])  Patient verbalizes understanding of instructions and care plan provided today and agrees to view in MyChart. Active MyChart status and patient understanding of how to access instructions and care plan via MyChart confirmed with patient.     Delsa Sale, RN, BSN, CCM Care Management Coordinator [facility] Care Management  Direct Phone: [phone]

## 2022-04-21 ENCOUNTER — Ambulatory Visit: Payer: Medicare Other | Attending: Cardiovascular Disease | Admitting: Pharmacist

## 2022-04-21 DIAGNOSIS — I480 Paroxysmal atrial fibrillation: Secondary | ICD-10-CM | POA: Diagnosis not present

## 2022-04-21 DIAGNOSIS — Z7901 Long term (current) use of anticoagulants: Secondary | ICD-10-CM | POA: Insufficient documentation

## 2022-04-21 LAB — POCT INR: INR: 2.6 (ref 2.0–3.0)

## 2022-04-21 NOTE — Patient Instructions (Signed)
Description   Continue taking warfarin 1 tablet daily, except 1/2 tablet Monday, Wednesday and Friday. Recheck INR in 1 week.  Please call us at [phone] with any questions.

## 2022-04-22 ENCOUNTER — Telehealth (HOSPITAL_BASED_OUTPATIENT_CLINIC_OR_DEPARTMENT_OTHER): Payer: Self-pay | Admitting: Family

## 2022-04-22 NOTE — Telephone Encounter (Signed)
Received the following from [facility] Care management,  "I am the [facility] RN care coordination nurse working with Mr. Gary [name]. We discussed his plan of care and your recommendations to hold his HCTZ. I also reviewed with him your recommendations regarding leg elevation, following a low Sodium diet, and wearing compression stockings. He asked me to let you know that he restarted his HCTZ about 3 days ago due the leg swelling was becoming severe. He reports his edema has now improved and his BP readings at home have been as follows;   111/54, 118/54, 106/45, 112/54 118/57,119/52, 115/54,120/47,110/54   He will continue to monitor his BP at home and let you know of abnormal BP readings and or new or worsening symptoms.   In addition, he is requesting for someone to give him a call to discuss his recent chest xray results.   I reinforced your recommendations and counseled on him s/s of hypotension and when to call his doctor if needed.   Please let me know if I can further assist with disease education and or care coordination."  Will ask nursing team to call him regarding the following: So long as no lightheadedness, dizziness with resuming HCTZ okay to continue. CXR from 04/07/22 showed prior changes from pneumonia were improving which is good. If still experiencing significant shortness of breath we can offer referral to pulmonology.  Alver Sorrow, NP

## 2022-04-22 NOTE — Telephone Encounter (Signed)
Returned call to patient,   He states most of that was in the hospital. He states since he has been home things have been much better and he is feeling great!    Will ask nursing team to call him regarding the following:  So long as no lightheadedness, dizziness with resuming HCTZ okay to continue.  CXR from 04/07/22 showed prior changes from pneumonia were improving which is good. If still experiencing significant shortness of breath we can offer referral to pulmonology.   Alver Sorrow, NP

## 2022-04-27 DIAGNOSIS — L812 Freckles: Secondary | ICD-10-CM | POA: Diagnosis not present

## 2022-04-27 DIAGNOSIS — Z85820 Personal history of malignant melanoma of skin: Secondary | ICD-10-CM | POA: Diagnosis not present

## 2022-04-27 DIAGNOSIS — C44519 Basal cell carcinoma of skin of other part of trunk: Secondary | ICD-10-CM | POA: Diagnosis not present

## 2022-04-27 DIAGNOSIS — D1801 Hemangioma of skin and subcutaneous tissue: Secondary | ICD-10-CM | POA: Diagnosis not present

## 2022-04-27 DIAGNOSIS — L570 Actinic keratosis: Secondary | ICD-10-CM | POA: Diagnosis not present

## 2022-04-27 DIAGNOSIS — Z85828 Personal history of other malignant neoplasm of skin: Secondary | ICD-10-CM | POA: Diagnosis not present

## 2022-04-27 DIAGNOSIS — L821 Other seborrheic keratosis: Secondary | ICD-10-CM | POA: Diagnosis not present

## 2022-04-27 DIAGNOSIS — D485 Neoplasm of uncertain behavior of skin: Secondary | ICD-10-CM | POA: Diagnosis not present

## 2022-04-28 ENCOUNTER — Ambulatory Visit: Payer: Medicare Other | Attending: Internal Medicine | Admitting: Pharmacist

## 2022-04-28 DIAGNOSIS — Z7901 Long term (current) use of anticoagulants: Secondary | ICD-10-CM | POA: Insufficient documentation

## 2022-04-28 DIAGNOSIS — I480 Paroxysmal atrial fibrillation: Secondary | ICD-10-CM | POA: Insufficient documentation

## 2022-04-28 LAB — POCT INR: INR: 2.8 (ref 2.0–3.0)

## 2022-04-28 NOTE — Patient Instructions (Signed)
Description   Continue taking warfarin 1 tablet daily, except 1/2 tablet Monday, Wednesday and Friday. Recheck INR in 1 week.  Please call us at [phone] with any questions.       

## 2022-04-30 NOTE — Telephone Encounter (Signed)
HCTZ was not on the patients medication list when reviewed but patient was restarted due to LLE over a phone note. Pt was instructed to stop HCTZ at least 3 days prior to admission. Pt verbalized agreement.

## 2022-05-05 ENCOUNTER — Ambulatory Visit: Payer: Medicare Other | Attending: Cardiovascular Disease

## 2022-05-05 DIAGNOSIS — I480 Paroxysmal atrial fibrillation: Secondary | ICD-10-CM | POA: Insufficient documentation

## 2022-05-05 DIAGNOSIS — Z7901 Long term (current) use of anticoagulants: Secondary | ICD-10-CM | POA: Diagnosis not present

## 2022-05-05 LAB — POCT INR: INR: 2.6 (ref 2.0–3.0)

## 2022-05-05 NOTE — Patient Instructions (Signed)
Description   Continue taking warfarin 1 tablet daily, except 1/2 tablet Monday, Wednesday and Friday. Recheck INR in 1 week.  Please call us at [phone] with any questions.       

## 2022-05-06 ENCOUNTER — Other Ambulatory Visit: Payer: Self-pay | Admitting: Cardiovascular Disease

## 2022-05-07 ENCOUNTER — Encounter (HOSPITAL_COMMUNITY): Payer: Self-pay

## 2022-05-11 ENCOUNTER — Ambulatory Visit
Admission: RE | Admit: 2022-05-11 | Discharge: 2022-05-11 | Disposition: A | Discharge status: Home / Self Care | Payer: Medicare Other | Source: Ambulatory Visit | Attending: Cardiology | Admitting: Cardiology

## 2022-05-11 ENCOUNTER — Telehealth (HOSPITAL_COMMUNITY): Payer: Self-pay | Admitting: Pharmacy Technician

## 2022-05-11 ENCOUNTER — Ambulatory Visit (INDEPENDENT_AMBULATORY_CARE_PROVIDER_SITE_OTHER): Payer: Medicare Other | Admitting: *Deleted

## 2022-05-11 ENCOUNTER — Other Ambulatory Visit (HOSPITAL_COMMUNITY): Payer: Self-pay

## 2022-05-11 ENCOUNTER — Ambulatory Visit (HOSPITAL_COMMUNITY)
Admission: RE | Admit: 2022-05-11 | Discharge: 2022-05-11 | Disposition: A | Discharge status: Home / Self Care | Payer: Medicare Other | Source: Ambulatory Visit | Attending: Internal Medicine | Admitting: Internal Medicine

## 2022-05-11 ENCOUNTER — Encounter (HOSPITAL_COMMUNITY): Payer: Self-pay | Admitting: Internal Medicine

## 2022-05-11 VITALS — BP 120/76 | HR 78 | Ht 70.0 in | Wt 235.0 lb

## 2022-05-11 DIAGNOSIS — Z7901 Long term (current) use of anticoagulants: Secondary | ICD-10-CM | POA: Insufficient documentation

## 2022-05-11 DIAGNOSIS — E785 Hyperlipidemia, unspecified: Secondary | ICD-10-CM | POA: Diagnosis not present

## 2022-05-11 DIAGNOSIS — I7121 Aneurysm of the ascending aorta, without rupture: Secondary | ICD-10-CM | POA: Diagnosis not present

## 2022-05-11 DIAGNOSIS — G4733 Obstructive sleep apnea (adult) (pediatric): Secondary | ICD-10-CM | POA: Diagnosis not present

## 2022-05-11 DIAGNOSIS — Z955 Presence of coronary angioplasty implant and graft: Secondary | ICD-10-CM | POA: Diagnosis not present

## 2022-05-11 DIAGNOSIS — I480 Paroxysmal atrial fibrillation: Secondary | ICD-10-CM | POA: Diagnosis not present

## 2022-05-11 DIAGNOSIS — Z79899 Other long term (current) drug therapy: Secondary | ICD-10-CM | POA: Insufficient documentation

## 2022-05-11 DIAGNOSIS — I5042 Chronic combined systolic (congestive) and diastolic (congestive) heart failure: Secondary | ICD-10-CM | POA: Diagnosis not present

## 2022-05-11 DIAGNOSIS — Z951 Presence of aortocoronary bypass graft: Secondary | ICD-10-CM | POA: Insufficient documentation

## 2022-05-11 DIAGNOSIS — Z85820 Personal history of malignant melanoma of skin: Secondary | ICD-10-CM | POA: Insufficient documentation

## 2022-05-11 DIAGNOSIS — I110 Hypertensive heart disease with heart failure: Secondary | ICD-10-CM | POA: Insufficient documentation

## 2022-05-11 DIAGNOSIS — D6869 Other thrombophilia: Secondary | ICD-10-CM | POA: Insufficient documentation

## 2022-05-11 DIAGNOSIS — I4819 Other persistent atrial fibrillation: Secondary | ICD-10-CM | POA: Diagnosis present

## 2022-05-11 DIAGNOSIS — Z87891 Personal history of nicotine dependence: Secondary | ICD-10-CM | POA: Insufficient documentation

## 2022-05-11 LAB — MAGNESIUM: Magnesium: 2 mg/dL (ref 1.7–2.4)

## 2022-05-11 LAB — BASIC METABOLIC PANEL
Anion gap: 8 (ref 5–15)
BUN: 11 mg/dL (ref 8–23)
CO2: 26 mmol/L (ref 22–32)
Calcium: 8.9 mg/dL (ref 8.9–10.3)
Chloride: 106 mmol/L (ref 98–111)
Creatinine, Ser: 1.04 mg/dL (ref 0.61–1.24)
GFR, Estimated: >60 mL/min (ref >60)
Glucose, Bld: 138 mg/dL — ABNORMAL HIGH (ref 70–99)
Potassium: 4.2 mmol/L (ref 3.5–5.1)
Sodium: 140 mmol/L (ref 135–145)

## 2022-05-11 LAB — PROTIME-INR
INR: 2.3 — ABNORMAL HIGH (ref 0.8–1.2)
Prothrombin Time: 25.4 seconds — ABNORMAL HIGH (ref 11.4–15.2)

## 2022-05-11 LAB — POCT INR: POC INR: 2.6

## 2022-05-11 MED ORDER — FERROUS SULFATE 325 (65 FE) MG PO TABS: 325.0000 mg | ORAL_TABLET | Freq: Every day | ORAL | Status: DC

## 2022-05-11 MED ORDER — ADULT MULTIVITAMIN W/MINERALS CH: 1.0000 | ORAL_TABLET | Freq: Every day | ORAL | Status: DC

## 2022-05-11 MED ORDER — CLOPIDOGREL BISULFATE 75 MG PO TABS: 75.0000 mg | ORAL_TABLET | Freq: Every day | ORAL | Status: DC

## 2022-05-11 MED ORDER — SPIRONOLACTONE 12.5 MG HALF TABLET: 12.5000 mg | ORAL_TABLET | Freq: Every day | ORAL | Status: DC

## 2022-05-11 MED ORDER — MAGNESIUM SULFATE 2 GM/50ML IV SOLN
2.0000 g | Freq: Once | INTRAVENOUS | Status: AC
  Administered 2022-05-11: 2 g via INTRAVENOUS
  Filled 2022-05-11: qty 50

## 2022-05-11 MED ORDER — ALPRAZOLAM 0.5 MG PO TABS: 0.5000 mg | ORAL_TABLET | Freq: Every day | ORAL | Status: DC

## 2022-05-11 MED ORDER — CALCIUM CARBONATE 600 MG PO TABS: 600.0000 mg | ORAL_TABLET | Freq: Every day | ORAL | Status: DC

## 2022-05-11 MED ORDER — POTASSIUM CHLORIDE CRYS ER 20 MEQ PO TBCR: 20.0000 meq | EXTENDED_RELEASE_TABLET | Freq: Once | ORAL | Status: DC

## 2022-05-11 MED ORDER — SODIUM CHLORIDE 0.9 % IV SOLN: 250.0000 mL | INTRAVENOUS | Status: DC | PRN

## 2022-05-11 MED ORDER — DOFETILIDE 500 MCG PO CAPS: 500.0000 ug | ORAL_CAPSULE | Freq: Two times a day (BID) | ORAL | Status: DC

## 2022-05-11 MED ORDER — METFORMIN HCL 500 MG PO TABS: 500.0000 mg | ORAL_TABLET | Freq: Two times a day (BID) | ORAL | Status: DC

## 2022-05-11 MED ORDER — SODIUM CHLORIDE 0.9% FLUSH: 3.0000 mL | INTRAVENOUS | Status: DC | PRN

## 2022-05-11 MED ORDER — ZINC SULFATE 220 (50 ZN) MG PO CAPS: 220.0000 mg | ORAL_CAPSULE | Freq: Every day | ORAL | Status: DC

## 2022-05-11 MED ORDER — VITAMIN D3 125 MCG (5000 UT) PO TABS: 10000.0000 [IU] | ORAL_TABLET | Freq: Every day | ORAL | Status: DC

## 2022-05-11 MED ORDER — SODIUM CHLORIDE 0.9% FLUSH
3.0000 mL | Freq: Two times a day (BID) | INTRAVENOUS | Status: DC
  Administered 2022-05-11: 3 mL via INTRAVENOUS

## 2022-05-11 MED ORDER — VENLAFAXINE HCL ER 150 MG PO CP24: 150.0000 mg | ORAL_CAPSULE | Freq: Every day | ORAL | Status: DC

## 2022-05-11 MED ORDER — ZINC CHELATED 22.5 MG PO TABS: 22.5000 mg | ORAL_TABLET | Freq: Every day | ORAL | Status: DC

## 2022-05-11 MED ORDER — PANTOPRAZOLE SODIUM 40 MG PO TBEC: 40.0000 mg | DELAYED_RELEASE_TABLET | Freq: Two times a day (BID) | ORAL | Status: DC

## 2022-05-11 MED ORDER — FUROSEMIDE 40 MG PO TABS: 40.0000 mg | ORAL_TABLET | Freq: Every day | ORAL | 3 refills | Status: DC | PRN

## 2022-05-11 MED ORDER — FUROSEMIDE 10 MG/ML IJ SOLN: 40.0000 mg | Freq: Once | INTRAMUSCULAR | Status: DC

## 2022-05-11 MED ORDER — CALCIUM CARBONATE 1250 (500 CA) MG PO TABS: 1.0000 | ORAL_TABLET | Freq: Every day | ORAL | Status: DC

## 2022-05-11 MED ORDER — LEVOTHYROXINE SODIUM 25 MCG PO TABS: 137.0000 ug | ORAL_TABLET | Freq: Every day | ORAL | Status: DC

## 2022-05-11 MED ORDER — LORATADINE 10 MG PO TABS: 10.0000 mg | ORAL_TABLET | Freq: Every day | ORAL | Status: DC

## 2022-05-11 MED ORDER — NITROGLYCERIN 0.4 MG SL SUBL: 0.4000 mg | SUBLINGUAL_TABLET | SUBLINGUAL | Status: DC | PRN

## 2022-05-11 MED ORDER — CARVEDILOL 12.5 MG PO TABS: 12.5000 mg | ORAL_TABLET | Freq: Two times a day (BID) | ORAL | Status: DC

## 2022-05-11 NOTE — Progress Notes (Addendum)
Primary Care Physician: Charlane Ferretti, DO Primary Cardiologist: Dr. Duke Salvia Primary Electrophysiologist: N/A Referring Physician: Dr. Alinda Deem Gary [name] is a 77 y.o. male with a history of hypertension, melanoma, CAD s/p PCI and CABG, mild ascending aortic aneurysm, chronic systolic and diastolic heart failure, hyperlipidemia, paroxysmal atrial fibrillation, and OSA non-compliant with CPAP who presents for consultation in the [facility]. Patient is on coumadin for a CHADS2VASC score of 6.  He underwent scheduled cardioversion on 02/26/22, for afib that was found in January. CV was successful in converting to sinus rhythm on the second shock .Unfortunately, he was admitted from 2/26-3/2 for aspiration pneumonia possibly secondary to  intubation/ DCCV as pt was on Ozempic, which was not stopped and he did not want to delay CV. He appeared to have been in sinus rhythm during admission. He is currently on carvedilol and warfarin. No bleeding concerns. He does state was on eliquis a couple years ago but switched to coumadin due to cost while in the donut hole. He is in rate controlled afib today and was not aware.   He cannot specifically tell me when he went back into Afib since hospital discharge. He admits to overall feeling low energy but it is not new and has felt this way for a while. He definitely feels better than he did 2 weeks ago. Has had Afib for about 4 years intermittently; required cardioversion about a year or so ago. Denies shortness of breath or palpitations. He is now compliant with his CPAP.   On follow up today, he is currently in rate controlled Afib. He is here today for Tikosyn admission after plan confirmed at OV with Dr. Elberta Fortis on 04/12/22. Medication list reviewed by pharmacy and no contraindications noted. He has completed 4 therapeutic INR checks being on coumadin. He has not taken benadryl in the last week. He does note some bilateral  ankle swelling since stopping aldactone.  Today, he denies symptoms of palpitations, chest pain, shortness of breath, orthopnea, PND, lower extremity edema, dizziness, presyncope, syncope, snoring, daytime somnolence, bleeding, or neurologic sequela. The patient is tolerating medications without difficulties and is otherwise without complaint today.    Atrial Fibrillation Risk Factors:  he does have symptoms or diagnosis of sleep apnea. he is not compliant with CPAP therapy. he does not have a history of rheumatic fever. he does not have a history of alcohol use. The patient does not have a history of early familial atrial fibrillation or other arrhythmias.  he has a BMI of Body mass index is 33.72 kg/m.Marland Kitchen Filed Weights   05/11/22 1104  Weight: 106.6 kg    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   Arthritis    Atrial fibrillation (HCC) 11/13/2014   Cancer (HCC)    skin   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/2016   LVEF 45-50% 11/2014   Coronary artery disease  Depression    Essential hypertension 11/13/2014   Hyperlipidemia    Hyperlipidemia 11/13/2014   Hypertension    Sleep apnea    CPAP 'Uses half the time"  last study 2009   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   Past Surgical History:  Procedure Laterality Date   CARDIOVERSION N/A 12/06/2014   Procedure: CARDIOVERSION;  Surgeon: Chilton Si, MD;  Location: [facility]  ENDOSCOPY;  Service: Cardiovascular;  Laterality: N/A;   CARDIOVERSION N/A 05/26/2017   Procedure: CARDIOVERSION;  Surgeon: Chilton Si, MD;  Location: [facility] ENDOSCOPY;  Service: Cardiovascular;  Laterality: N/A;   CARDIOVERSION N/A 04/25/2019   Procedure: CARDIOVERSION;  Surgeon: Thurmon Fair, MD;  Location: [facility] ENDOSCOPY;  Service: Cardiovascular;  Laterality: N/A;   CARDIOVERSION N/A 02/26/2022   Procedure: CARDIOVERSION;  Surgeon: Chilton Si, MD;  Location: [facility] ENDOSCOPY;  Service: Cardiovascular;  Laterality: N/A;   CARPAL TUNNEL RELEASE     CORONARY ANGIOPLASTY     multiple stents   CORONARY ARTERY BYPASS GRAFT  2006   CORONARY STENT INTERVENTION N/A 09/28/2021   Procedure: CORONARY STENT INTERVENTION;  Surgeon: Runell Gess, MD;  Location: [facility] INVASIVE CV LAB;  Service: Cardiovascular;  Laterality: N/A;  SVG-PDA   EYE SURGERY     right eye-  muscular repair   LEFT HEART CATH AND CORS/GRAFTS ANGIOGRAPHY N/A 09/28/2021   Procedure: LEFT HEART CATH AND CORS/GRAFTS ANGIOGRAPHY;  Surgeon: Runell Gess, MD;  Location: [facility] INVASIVE CV LAB;  Service: Cardiovascular;  Laterality: N/A;   TOTAL HIP ARTHROPLASTY  01/03/2012   Procedure: TOTAL HIP ARTHROPLASTY;  Surgeon: Jacki Cones, MD;  Location: [facility] ORS;  Service: Orthopedics;  Laterality: Right;    Current Outpatient Medications  Medication Sig Dispense Refill   ALPRAZolam (XANAX) 0.5 MG tablet Take 0.5 mg by mouth at bedtime.     calcium carbonate (OS-CAL) 600 MG TABS tablet Take 600 mg by mouth daily with breakfast.     carvedilol (COREG) 25 MG tablet Take 0.5 tablets (12.5 mg total) by mouth 2 (two) times daily.     Cholecalciferol (VITAMIN D3) 125 MCG (5000 UT) TABS Take 10,000 Units by mouth daily.     clopidogrel (PLAVIX) 75 MG tablet Take 1 tablet (75 mg total) by mouth daily with breakfast. Take every day for 1 year 90 tablet 3   Coenzyme Q10 (CO Q 10 PO) Take 400 mg by mouth daily.     Evolocumab (REPATHA  SURECLICK) 140 MG/ML SOAJ INJECT 140 MG INTO THE SKIN EVERY 14 (FOURTEEN) DAYS. INJECT 1 PEN INTO THE FATTY TISSUE SKIN EVERY 14 DAY 6 mL 3   levothyroxine (SYNTHROID, LEVOTHROID) 137 MCG tablet Take 137 mcg by mouth daily before breakfast.     loratadine (CLARITIN) 10 MG tablet Take 10 mg by mouth daily.     metFORMIN (GLUCOPHAGE) 500 MG tablet Take 500 mg by mouth 2 (two) times daily.  12   Multiple Vitamin (MULTIVITAMIN WITH MINERALS) TABS tablet Take 1 tablet by mouth daily. Centrum silver men     pantoprazole (PROTONIX) 40 MG tablet Take 40 mg by mouth 2 (two) times daily.     riTUXimab (RITUXAN IV) Inject 1 application  into the vein See admin instructions. Take every two weeks and every six months alternating     venlafaxine XR (EFFEXOR-XR) 150 MG 24 hr capsule Take 150 mg by mouth daily.      warfarin (COUMADIN) 5 MG tablet TAKE 1/2 TABLET TO 1 TABLET BY MOUTH DAILY AS DIRECTED BY COUMADIN CLINIC (Patient  taking differently: Take 2.5-5 mg by mouth See admin instructions. Take 2.5 mg on Wednesday all the other days take 5 mg in the morning AS DIRECTED BY COUMADIN CLINIC) 75 tablet 0   Zinc Chelated 22.5 MG TABS Take 22.5 mg by mouth daily.     nitroGLYCERIN (NITROSTAT) 0.4 MG SL tablet Place 1 tablet (0.4 mg total) under the tongue every 5 (five) minutes x 3 doses as needed for chest pain. (Patient not taking: Reported on 05/11/2022) 25 tablet 12   predniSONE (DELTASONE) 5 MG tablet Take 5 mg by mouth daily with breakfast. (Patient not taking: Reported on 05/11/2022)     spironolactone (ALDACTONE) 25 MG tablet Take 0.5 tablets (12.5 mg total) by mouth daily. (Patient not taking: Reported on 05/11/2022) 30 tablet 0   testosterone cypionate (DEPOTESTOTERONE CYPIONATE) 100 MG/ML injection Inject 100 mg into the muscle every 14 (fourteen) days. (Patient not taking: Reported on 05/11/2022)     No current facility-administered medications for this encounter.    Allergies  Allergen Reactions   Farxiga  [Dapagliflozin]     Lightheaded and low blood pressure    Statins Other (See Comments)    Muscle pain    Social History   Socioeconomic History   Marital status: Married    Spouse name: Not on file   Number of children: Not on file   Years of education: Not on file   Highest education level: Not on file  Occupational History   Not on file  Tobacco Use   Smoking status: Former    Types: Cigarettes    Quit date: 12/24/1983    Years since quitting: 38.4   Smokeless tobacco: Never  Vaping Use   Vaping Use: Never used  Substance and Sexual Activity   Alcohol use: No    Alcohol/week: 0.0 standard drinks of alcohol   Drug use: No   Sexual activity: Not on file  Other Topics Concern   Not on file  Social History Narrative   Not on file   Social Determinants of Health   Financial Resource Strain: Not on file  Food Insecurity: No Food Insecurity (03/02/2022)   Hunger Vital Sign    Worried About Running Out of Food in the Last Year: Never true    Ran Out of Food in the Last Year: Never true  Transportation Needs: No Transportation Needs (03/09/2022)   PRAPARE - Administrator, Civil Service (Medical): No    Lack of Transportation (Non-Medical): No  Physical Activity: Not on file  Stress: Not on file  Social Connections: Not on file  Intimate Partner Violence: Not At Risk (03/02/2022)   Humiliation, Afraid, Rape, and Kick questionnaire    Fear of Current or Ex-Partner: No    Emotionally Abused: No    Physically Abused: No    Sexually Abused: No     ROS- All systems are reviewed and negative except as per the HPI above.  Physical Exam: Vitals:   05/11/22 1104  BP: 120/76  Pulse: 78  Weight: 106.6 kg  Height: 5\' 10"  (1.778 m)    GEN- The patient is well appearing, alert and oriented x 3 today.   Head- normocephalic, atraumatic Eyes-  Sclera clear, conjunctiva pink Ears- hearing intact Oropharynx- clear Neck- supple, no JVP Lymph- no cervical  lymphadenopathy Lungs- Clear to ausculation bilaterally, normal work of breathing Heart- Irregular rate and rhythm, no murmurs, rubs or gallops, PMI not laterally displaced GI- soft, NT, ND, + BS Extremities- no  clubbing, cyanosis. Mild bilateral ankle edema. MS- no significant deformity or atrophy Skin- no rash or lesion Psych- euthymic mood, full affect Neuro- strength and sensation are intact   Wt Readings from Last 3 Encounters:  05/11/22 106.6 kg  04/12/22 108 kg  03/26/22 108 kg    EKG today demonstrates  Vent. rate 78 BPM PR interval * ms QRS duration 102 ms QT/QTcB 396/451 ms P-R-T axes * -24 167 Atrial fibrillation Nonspecific ST and T wave abnormality Abnormal ECG When compared with ECG of 22-Mar-2022 09:36, PREVIOUS ECG IS PRESENT  Echo 03/02/22 demonstrated    1. Left ventricular ejection fraction, by estimation, is 50%. The left  ventricle has mildly decreased function. The left ventricle demonstrates  global hypokinesis. There is moderate concentric left ventricular  hypertrophy. Left ventricular diastolic  parameters are consistent with Grade II diastolic dysfunction  (pseudonormalization). Elevated left atrial pressure.   2. Right ventricular systolic function is mildly reduced. The right  ventricular size is normal. There is mildly elevated pulmonary artery  systolic pressure. The estimated right ventricular systolic pressure is  35.5 mmHg.   3. Left atrial size was severely dilated.   4. The mitral valve is normal in structure. Trivial mitral valve  regurgitation. No evidence of mitral stenosis. Moderate mitral annular  calcification.   5. The aortic valve is tricuspid. There is mild calcification of the  aortic valve. There is mild thickening of the aortic valve. Aortic valve  regurgitation is not visualized. Aortic valve sclerosis/calcification is  present, without any evidence of  aortic stenosis.   6. The inferior vena cava is normal in size with  greater than 50%  respiratory variability, suggesting right atrial pressure of 3 mmHg.   Comparison(s): No significant change from prior study. Prior images  reviewed side by side.   Heart cath 09/28/21:    Prox LAD to Mid LAD lesion is 100% stenosed.   Ost LAD to Prox LAD lesion is 100% stenosed.   Ramus lesion is 100% stenosed.   Prox RCA lesion is 100% stenosed.   Prox RCA to Dist RCA lesion is 100% stenosed.   Ost Cx to Prox Cx lesion is 30% stenosed.   Dist Graft to Insertion lesion is 99% stenosed.   A drug-eluting stent was successfully placed using a SYNERGY XD [id number].   Post intervention, there is a 0% residual stenosis.  Epic records are reviewed at length today  CHA2DS2-VASc Score = 6  The patient's score is based upon: CHF History: 1 HTN History: 1 Diabetes History: 1 Stroke History: 0 Vascular Disease History: 1 Age Score: 2 Gender Score: 0       ASSESSMENT AND PLAN: Persistent Atrial Fibrillation (ICD10:  I48.0) The patient's CHA2DS2-VASc score is 6, indicating a 9.7% annual risk of stroke.    He is in Afib today.   Patient would like to pursue dofetilide load and presents today for dofetilide admission. Continue coumadin; 4 therapeutic INRs. No recent benadryl use PharmD has screened medications. QTc in SR 380 ms (ECG on 03/02/22) Labs 03/06/22 show creatinine at 1.13, K+ 3.8; mag level pending, CrCl calculated at 80 mL/min  He is eligible for 500 mcg BID dosage. Current labs drawn today still pending.    2. Secondary Hypercoagulable State (ICD10:  D68.69) The patient is at significant risk for stroke/thromboembolism based upon his CHA2DS2-VASc Score of 6.  Continue Warfarin (Coumadin).   Continue coumadin.   He will be admitted today for Tikosyn admission.  [name], PA-C  [facility] [address] [place], [place] [address] [phone]

## 2022-05-11 NOTE — Telephone Encounter (Signed)
Pharmacy Patient Advocate Encounter  Insurance verification completed.    The patient is insured through [employer]   The patient is currently admitted and ran test claims for the following: dofetilide (Tikosyn).  Copays and coinsurance results were relayed to Inpatient clinical team.

## 2022-05-11 NOTE — Patient Instructions (Signed)
Description   Continue taking warfarin 1 tablet daily, except 1/2 tablet Monday, Wednesday and Friday. Recheck INR in 1 week post hospital stay.  Please call us at [phone] with any questions.

## 2022-05-11 NOTE — Progress Notes (Signed)
[name] to be D/C'd home per MD order. Discussed with the patient and all questions fully answered.  Skin clean, dry and intact without evidence of skin break down, no evidence of skin tears noted.  IV catheter discontinued intact. Site without signs and symptoms of complications. Dressing and pressure applied.  An After Visit Summary was printed and given to the patient.  Patient escorted via WC, and D/C home via private auto.  Jon Gills  05/11/2022

## 2022-05-11 NOTE — Care Management (Addendum)
  Transition of Care [facility]) Screening Note   Patient Details  Name: Gary [name] Date of Birth: [birth year]   Transition of Care [facility]) CM/SW Contact:    Gala Lewandowsky, RN Phone Number: 05/11/2022, 1:22 PM   Transition of Care Department [facility]) has reviewed the patient. Patient presented for Tikosyn Load. Benefits check has been submitted for cost. Case Manager will follow for cost and pharmacy of choice as the patient progresses.     $39.29 co pay

## 2022-05-11 NOTE — H&P (View-Only) (Signed)
   Unfortunately, during inpatient medication reconciliation it was noted that pt was started on bactrim after med review for Tikosyn appropriateness.  Given it's association with QT prolongation he is not a candidate for Tikosyn as this time.   Message was sent to pts rheumatologist to consider alternative agents so that he can be reconsidered for Tikosyn in the future.   Admission was cancelled, pt will keep appointment for cardioversion on Thursday, with rescheduled follow up in the AF clinic to consider next steps  [facility], PA-C  05/11/2022 4:40 PM

## 2022-05-11 NOTE — Progress Notes (Signed)
ANTICOAGULATION CONSULT NOTE - Initial Consult  Pharmacy Consult for warfarin Indication: atrial fibrillation  Allergies  Allergen Reactions   Farxiga [Dapagliflozin]     Lightheaded and low blood pressure    Statins Other (See Comments)    Muscle pain    Patient Measurements:   Heparin Dosing Weight: 95.9 kg   Vital Signs: Temp: 97.4 F (36.3 C) (05/07 1216) Temp Source: Oral (05/07 1216) BP: 145/81 (05/07 1216) Pulse Rate: 84 (05/07 1216)  Labs: Recent Labs    05/11/22 0941 05/11/22 1055 05/11/22 1322  LABPROT  --   --  25.4*  INR 2.6  --  2.3*  CREATININE  --  1.04  --     Estimated Creatinine Clearance: 70.4 mL/min (by C-G formula based on SCr of 1.04 mg/dL).   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    Medications:  Scheduled:   ALPRAZolam  0.5 mg Oral QHS   [START ON 05/12/2022] calcium carbonate  600 mg Oral Q breakfast   carvedilol  12.5 mg Oral BID   [START ON 05/12/2022] clopidogrel  75 mg Oral Q breakfast   dofetilide  500 mcg Oral BID   ferrous sulfate  325 mg Oral Q2000   furosemide  40 mg Intravenous Once   [START ON 05/12/2022] levothyroxine  137 mcg Oral QAC breakfast   [START ON 05/12/2022] loratadine  10 mg Oral Daily   metFORMIN  500 mg Oral BID WC   [START ON 05/12/2022] multivitamin with minerals  1 tablet Oral Daily   pantoprazole  40 mg Oral BID   potassium chloride  20 mEq Oral Once   sodium chloride flush  3 mL Intravenous Q12H   [START ON  05/12/2022] spironolactone  12.5 mg Oral Daily   [START ON 05/12/2022] venlafaxine XR  150 mg Oral Daily   [START ON 05/12/2022] Vitamin D3  10,000 Units Oral Daily   [START ON 05/12/2022] Zinc Chelated  22.5 mg Oral Daily    Assessment: 77 yom who is being admitted for tikosyn initiation. On warfarin PTA for hx Afib (LD 5/7 at 0730).   PTA regimen is 5 mg daily but 2.5 mg MWF.  INR on admission is 2.3 (at goal). No s/sx of bleeding.   Goal of Therapy:  INR 2-3 Monitor platelets by anticoagulation protocol: Yes   Plan:  Already got warfarin today  Monitor daily INR, CBC, and for s/sx of bleeding   Thank you for allowing pharmacy to participate in this patient's care,  Sherron Monday, PharmD, BCCCP Clinical Pharmacist  Phone: [phone] 05/11/2022 3:02 PM  Please check AMION for all [facility] Pharmacy phone numbers After 10:00 PM, call Main Pharmacy [phone]

## 2022-05-11 NOTE — Progress Notes (Signed)
   Unfortunately, during inpatient medication reconciliation it was noted that pt was started on bactrim after med review for Tikosyn appropriateness.  Given it's association with QT prolongation he is not a candidate for Tikosyn as this time.   Message was sent to pts rheumatologist to consider alternative agents so that he can be reconsidered for Tikosyn in the future.   Admission was cancelled, pt will keep appointment for cardioversion on Thursday, with rescheduled follow up in the AF clinic to consider next steps  Gary Thorup "Andy" Deeanne Deininger, PA-C  05/11/2022 4:40 PM  

## 2022-05-11 NOTE — TOC Benefit Eligibility Note (Signed)
Patient Product/process development scientist completed.    The patient is currently admitted and upon discharge could be taking dofetilide (Tikosyn) 500 mcg capsules.  The current 30 day co-pay is $39.29.   The patient is insured through [employer]   This test claim was processed through [name]- copay amounts may vary at other pharmacies due to pharmacy/plan contracts, or as the patient moves through the different stages of their insurance plan.  Gary [name], CPHT Pharmacy Patient Advocate Specialist [facility] Patient Advocate Team Direct Number: [phone]  Fax: [phone]

## 2022-05-11 NOTE — Discharge Instructions (Signed)
Nothing to eat or drink after midnight 5/8 for Cardioversion 5/9 at 0930  Please arrive to main entrance of [facility] at 0800 on am of 5/9.  Gary [name] [address] [place], [place]  05/11/2022 4:34 PM

## 2022-05-11 NOTE — H&P (Addendum)
Primary Care Physician: Charlane Ferretti, DO Primary Cardiologist: Dr. Duke Salvia Primary Electrophysiologist: N/A Referring Physician: Dr. Alinda Deem Gary [name] is a 77 y.o. male with a history of hypertension, melanoma, CAD s/p PCI and CABG, mild ascending aortic aneurysm, chronic systolic and diastolic heart failure, hyperlipidemia, paroxysmal atrial fibrillation, and OSA non-compliant with CPAP who presents for consultation in the [facility] Atrial Fibrillation [facility]. Patient is on coumadin for a CHADS2VASC score of 6.  He underwent scheduled cardioversion on 02/26/22, for afib that was found in January. CV was successful in converting to sinus rhythm on the second shock .Unfortunately, he was admitted from 2/26-3/2 for aspiration pneumonia possibly secondary to  intubation/ DCCV as pt was on Ozempic, which was not stopped and he did not want to delay CV. He appeared to have been in sinus rhythm during admission. He is currently on carvedilol and warfarin. No bleeding concerns. He does state was on eliquis a couple years ago but switched to coumadin due to cost while in the donut hole. He is in rate controlled afib today and was not aware.   He cannot specifically tell me when he went back into Afib since hospital discharge. He admits to overall feeling low energy but it is not new and has felt this way for a while. He definitely feels better than he did 2 weeks ago. Has had Afib for about 4 years intermittently; required cardioversion about a year or so ago. Denies shortness of breath or palpitations. He is now compliant with his CPAP.   On follow up today, he is currently in rate controlled Afib. He is here today for Tikosyn admission after plan confirmed at OV with Dr. Elberta Fortis on 04/12/22. Medication list reviewed by pharmacy and no contraindications noted. He has completed 4 therapeutic INR checks being on coumadin. He has not taken benadryl in the last week. He does note some bilateral  ankle swelling since stopping aldactone.  Today, he denies symptoms of palpitations, chest pain, shortness of breath, orthopnea, PND, lower extremity edema, dizziness, presyncope, syncope, snoring, daytime somnolence, bleeding, or neurologic sequela. The patient is tolerating medications without difficulties and is otherwise without complaint today.    Atrial Fibrillation Risk Factors:  he does have symptoms or diagnosis of sleep apnea. he is not compliant with CPAP therapy. he does not have a history of rheumatic fever. he does not have a history of alcohol use. The patient does not have a history of early familial atrial fibrillation or other arrhythmias.  he has a BMI of There is no height or weight on file to calculate BMI.. There were no vitals filed for this visit.   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   Arthritis    Atrial fibrillation (HCC) 11/13/2014   Cancer (HCC)    skin   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/2016   LVEF 45-50% 11/2014   Coronary artery disease  Depression    Essential hypertension 11/13/2014   Hyperlipidemia    Hyperlipidemia 11/13/2014   Hypertension    Sleep apnea    CPAP 'Uses half the time"  last study 2009   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   Past Surgical History:  Procedure Laterality Date   CARDIOVERSION N/A 12/06/2014   Procedure: CARDIOVERSION;  Surgeon: Chilton Si, MD;   Location: [facility] ENDOSCOPY;  Service: Cardiovascular;  Laterality: N/A;   CARDIOVERSION N/A 05/26/2017   Procedure: CARDIOVERSION;  Surgeon: Chilton Si, MD;  Location: [facility] ENDOSCOPY;  Service: Cardiovascular;  Laterality: N/A;   CARDIOVERSION N/A 04/25/2019   Procedure: CARDIOVERSION;  Surgeon: Thurmon Fair, MD;  Location: [facility] ENDOSCOPY;  Service: Cardiovascular;  Laterality: N/A;   CARDIOVERSION N/A 02/26/2022   Procedure: CARDIOVERSION;  Surgeon: Chilton Si, MD;  Location: [facility] ENDOSCOPY;  Service: Cardiovascular;  Laterality: N/A;   CARPAL TUNNEL RELEASE     CORONARY ANGIOPLASTY     multiple stents   CORONARY ARTERY BYPASS GRAFT  2006   CORONARY STENT INTERVENTION N/A 09/28/2021   Procedure: CORONARY STENT INTERVENTION;  Surgeon: Runell Gess, MD;  Location: [facility] INVASIVE CV LAB;  Service: Cardiovascular;  Laterality: N/A;  SVG-PDA   EYE SURGERY     right eye-  muscular repair   LEFT HEART CATH AND CORS/GRAFTS ANGIOGRAPHY N/A 09/28/2021   Procedure: LEFT HEART CATH AND CORS/GRAFTS ANGIOGRAPHY;  Surgeon: Runell Gess, MD;  Location: [facility] INVASIVE CV LAB;  Service: Cardiovascular;  Laterality: N/A;   TOTAL HIP ARTHROPLASTY  01/03/2012   Procedure: TOTAL HIP ARTHROPLASTY;  Surgeon: Jacki Cones, MD;  Location: [facility] ORS;  Service: Orthopedics;  Laterality: Right;    Current Facility-Administered Medications  Medication Dose Route Frequency Provider Last Rate Last Admin   0.9 %  sodium chloride infusion  250 mL Intravenous PRN Eustace Pen, PA-C       ALPRAZolam Prudy Feeler) tablet 0.5 mg  0.5 mg Oral QHS Eustace Pen, PA-C       [START ON 05/12/2022] calcium carbonate (OS-CAL) tablet 600 mg  600 mg Oral Q breakfast Eustace Pen, PA-C       carvedilol (COREG) tablet 12.5 mg  12.5 mg Oral BID Eustace Pen, PA-C       [START ON 05/12/2022] clopidogrel (PLAVIX) tablet 75 mg  75 mg Oral Q breakfast Eustace Pen, PA-C       dofetilide [facility]) capsule 500 mcg  500 mcg  Oral BID Eustace Pen, PA-C       ferrous sulfate tablet 325 mg  325 mg Oral Q2000 Eustace Pen, PA-C       [START ON 05/12/2022] levothyroxine (SYNTHROID) tablet 137 mcg  137 mcg Oral QAC breakfast Eustace Pen, PA-C       [START ON 05/12/2022] loratadine (CLARITIN) tablet 10 mg  10 mg Oral Daily Eustace Pen, PA-C       magnesium sulfate IVPB 2 g 50 mL  2 g Intravenous Once Kendal Hymen, RPH       metFORMIN (GLUCOPHAGE) tablet 500 mg  500 mg Oral BID Eustace Pen, PA-C       [START ON 05/12/2022] multivitamin with minerals tablet 1 tablet  1 tablet Oral Daily Eustace Pen, PA-C       nitroGLYCERIN (NITROSTAT) SL tablet 0.4 mg  0.4 mg Sublingual Q5 Min x 3 PRN Eustace Pen, PA-C       pantoprazole (PROTONIX) EC tablet 40 mg  40 mg Oral  BID Eustace Pen, PA-C       sodium chloride flush (NS) 0.9 % injection 3 mL  3 mL Intravenous Q12H Eustace Pen, PA-C       sodium chloride flush (NS) 0.9 % injection 3 mL  3 mL Intravenous PRN Eustace Pen, PA-C       [START ON 05/12/2022] venlafaxine XR (EFFEXOR-XR) 24 hr capsule 150 mg  150 mg Oral Daily Eustace Pen, [place]       [START ON 05/12/2022] Vitamin D3 TABS 10,000 Units  10,000 Units Oral Daily Eustace Pen, PA-C       [START ON 05/12/2022] Zinc Chelated TABS 22.5 mg  22.5 mg Oral Daily Eustace Pen, PA-C        Allergies  Allergen Reactions   Marcelline Deist [Dapagliflozin]     Lightheaded and low blood pressure    Statins Other (See Comments)    Muscle pain    Social History   Socioeconomic History   Marital status: Married    Spouse name: Not on file   Number of children: Not on file   Years of education: Not on file   Highest education level: Not on file  Occupational History   Not on file  Tobacco Use   Smoking status: Former    Types: Cigarettes    Quit date: 12/24/1983    Years since quitting: 38.4   Smokeless tobacco: Never  Vaping Use   Vaping Use: Never used  Substance and Sexual  Activity   Alcohol use: No    Alcohol/week: 0.0 standard drinks of alcohol   Drug use: No   Sexual activity: Not on file  Other Topics Concern   Not on file  Social History Narrative   Not on file   Social Determinants of Health   Financial Resource Strain: Not on file  Food Insecurity: No Food Insecurity (03/02/2022)   Hunger Vital Sign    Worried About Running Out of Food in the Last Year: Never true    Ran Out of Food in the Last Year: Never true  Transportation Needs: No Transportation Needs (03/09/2022)   PRAPARE - Transportation    Lack of Transportation (Medical): No    Lack of Transportation (Non-Medical): No  Physical Activity: Not on file  Stress: Not on file  Social Connections: Not on file  Intimate Partner Violence: Not At Risk (03/02/2022)   Humiliation, Afraid, Rape, and Kick questionnaire    Fear of Current or Ex-Partner: No    Emotionally Abused: No    Physically Abused: No    Sexually Abused: No     ROS- All systems are reviewed and negative except as per the HPI above.  Physical Exam: Vitals:   05/11/22 1216  BP: (!) 145/81  Pulse: 84  Resp: 16  Temp: (!) 97.4 F (36.3 C)  TempSrc: Oral    GEN- The patient is well appearing, alert and oriented x 3 today.   Head- normocephalic, atraumatic Eyes-  Sclera clear, conjunctiva pink Ears- hearing intact Oropharynx- clear Neck- supple, no JVP Lymph- no cervical lymphadenopathy Lungs- Clear to ausculation bilaterally, normal work of breathing Heart- Irregular rate and rhythm, no murmurs, rubs or gallops, PMI not laterally displaced GI- soft, NT, ND, + BS Extremities- no clubbing, cyanosis. Mild bilateral ankle edema. MS- no significant deformity or atrophy Skin- no rash or lesion Psych- euthymic mood, full affect Neuro- strength and sensation are intact   Wt Readings from Last 3 Encounters:  05/11/22  106.6 kg  04/12/22 108 kg  03/26/22 108 kg    EKG today demonstrates  Vent. rate 78 BPM PR  interval * ms QRS duration 102 ms QT/QTcB 396/451 ms P-R-T axes * -24 167 Atrial fibrillation Nonspecific ST and T wave abnormality Abnormal ECG When compared with ECG of 22-Mar-2022 09:36, PREVIOUS ECG IS PRESENT  Echo 03/02/22 demonstrated    1. Left ventricular ejection fraction, by estimation, is 50%. The left  ventricle has mildly decreased function. The left ventricle demonstrates  global hypokinesis. There is moderate concentric left ventricular  hypertrophy. Left ventricular diastolic  parameters are consistent with Grade II diastolic dysfunction  (pseudonormalization). Elevated left atrial pressure.   2. Right ventricular systolic function is mildly reduced. The right  ventricular size is normal. There is mildly elevated pulmonary artery  systolic pressure. The estimated right ventricular systolic pressure is  35.5 mmHg.   3. Left atrial size was severely dilated.   4. The mitral valve is normal in structure. Trivial mitral valve  regurgitation. No evidence of mitral stenosis. Moderate mitral annular  calcification.   5. The aortic valve is tricuspid. There is mild calcification of the  aortic valve. There is mild thickening of the aortic valve. Aortic valve  regurgitation is not visualized. Aortic valve sclerosis/calcification is  present, without any evidence of  aortic stenosis.   6. The inferior vena cava is normal in size with greater than 50%  respiratory variability, suggesting right atrial pressure of 3 mmHg.   Comparison(s): No significant change from prior study. Prior images  reviewed side by side.   Heart cath 09/28/21:    Prox LAD to Mid LAD lesion is 100% stenosed.   Ost LAD to Prox LAD lesion is 100% stenosed.   Ramus lesion is 100% stenosed.   Prox RCA lesion is 100% stenosed.   Prox RCA to Dist RCA lesion is 100% stenosed.   Ost Cx to Prox Cx lesion is 30% stenosed.   Dist Graft to Insertion lesion is 99% stenosed.   A drug-eluting stent was  successfully placed using a SYNERGY XD [id number].   Post intervention, there is a 0% residual stenosis.  Epic records are reviewed at length today  CHA2DS2-VASc Score = 6  The patient's score is based upon: CHF History: 1 HTN History: 1 Diabetes History: 1 Stroke History: 0 Vascular Disease History: 1 Age Score: 2 Gender Score: 0       ASSESSMENT AND PLAN: Persistent Atrial Fibrillation (ICD10:  I48.0) The patient's CHA2DS2-VASc score is 6, indicating a 9.7% annual risk of stroke.    He is in Afib today.   Patient would like to pursue dofetilide load and presents today for dofetilide admission. Continue coumadin; 4 therapeutic INRs. No recent benadryl use PharmD has screened medications. QTc in SR 380 ms (ECG on 03/02/22) Labs 03/06/22 show creatinine at 1.13, K+ 3.8; mag level pending, CrCl calculated at 80 mL/min  He is eligible for 500 mcg BID dosage. Current labs drawn today still pending.  2. Secondary Hypercoagulable State (ICD10:  D68.69) The patient is at significant risk for stroke/thromboembolism based upon his CHA2DS2-VASc Score of 6.  Continue Warfarin (Coumadin).   Continue coumadin.   He presents today for Tikosyn admission as above.   Casimiro Needle [address] [place], PA-C  05/11/2022 1:03 PM   I have seen and examined this patient with Otilio Saber.  Agree with above, note added to reflect my findings.  Admitted to the hospital for dofetilide load.  Patient was  found to be on Bactrim.  Bactrim is contraindicated with dofetilide.  Patient Rohnan Bartleson be discharged today and Kiannah Grunow discuss with his rheumatologist whether or not he can switch to a medication such as dapsone.  Isadore Bokhari keep his cardioversion slot on Thursday.  GEN: Well nourished, well developed, in no acute distress  HEENT: normal  Neck: no JVD, carotid bruits, or masses Cardiac: Irregular RRR; no murmurs, rubs, or gallops,no edema  Respiratory:  clear to auscultation bilaterally, normal work of breathing GI: soft,  nontender, nondistended, + BS MS: no deformity or atrophy  Skin: warm and dry Neuro:  Strength and sensation are intact Psych: euthymic mood, full affect     Diron Haddon M. Derold Dorsch MD 05/11/2022 3:59 PM

## 2022-05-12 NOTE — Pre-Procedure Instructions (Signed)
Spoke to patient on the phone regarding cardioversion tomorrow - arrive at 0830, NPO after midnight, confirmed patient has ride home and responsible person to stay with him for 24 hours after the procedure

## 2022-05-13 ENCOUNTER — Ambulatory Visit (HOSPITAL_BASED_OUTPATIENT_CLINIC_OR_DEPARTMENT_OTHER): Payer: Medicare Other | Admitting: Anesthesiology

## 2022-05-13 ENCOUNTER — Ambulatory Visit (HOSPITAL_COMMUNITY): Payer: Medicare Other | Admitting: Anesthesiology

## 2022-05-13 ENCOUNTER — Other Ambulatory Visit: Payer: Self-pay

## 2022-05-13 ENCOUNTER — Encounter (HOSPITAL_COMMUNITY): Payer: Self-pay | Admitting: Cardiology

## 2022-05-13 ENCOUNTER — Ambulatory Visit (HOSPITAL_COMMUNITY)
Admission: RE | Admit: 2022-05-13 | Discharge: 2022-05-13 | Disposition: A | Discharge status: Home / Self Care | Payer: Medicare Other | Source: Ambulatory Visit | Attending: Cardiology | Admitting: Cardiology

## 2022-05-13 ENCOUNTER — Encounter (HOSPITAL_COMMUNITY)
Admission: RE | Disposition: A | Discharge status: Home / Self Care | Payer: Self-pay | Source: Ambulatory Visit | Attending: Cardiology

## 2022-05-13 DIAGNOSIS — I2510 Atherosclerotic heart disease of native coronary artery without angina pectoris: Secondary | ICD-10-CM | POA: Diagnosis not present

## 2022-05-13 DIAGNOSIS — I509 Heart failure, unspecified: Secondary | ICD-10-CM | POA: Diagnosis not present

## 2022-05-13 DIAGNOSIS — E119 Type 2 diabetes mellitus without complications: Secondary | ICD-10-CM | POA: Diagnosis not present

## 2022-05-13 DIAGNOSIS — Z87891 Personal history of nicotine dependence: Secondary | ICD-10-CM | POA: Diagnosis not present

## 2022-05-13 DIAGNOSIS — I110 Hypertensive heart disease with heart failure: Secondary | ICD-10-CM

## 2022-05-13 DIAGNOSIS — I4891 Unspecified atrial fibrillation: Secondary | ICD-10-CM | POA: Diagnosis not present

## 2022-05-13 DIAGNOSIS — I252 Old myocardial infarction: Secondary | ICD-10-CM

## 2022-05-13 DIAGNOSIS — I4819 Other persistent atrial fibrillation: Secondary | ICD-10-CM

## 2022-05-13 HISTORY — PX: CARDIOVERSION: SHX1299

## 2022-05-13 LAB — POCT I-STAT, CHEM 8
BUN: 15 mg/dL (ref 8–23)
Calcium, Ion: 1.23 mmol/L (ref 1.15–1.40)
Chloride: 102 mmol/L (ref 98–111)
Creatinine, Ser: 1.2 mg/dL (ref 0.61–1.24)
Glucose, Bld: 123 mg/dL — ABNORMAL HIGH (ref 70–99)
HCT: 40 % (ref 39.0–52.0)
Hemoglobin: 13.6 g/dL (ref 13.0–17.0)
Potassium: 3.5 mmol/L (ref 3.5–5.1)
Sodium: 144 mmol/L (ref 135–145)
TCO2: 34 mmol/L — ABNORMAL HIGH (ref 22–32)

## 2022-05-13 LAB — PROTIME-INR
INR: 2 — ABNORMAL HIGH (ref 0.8–1.2)
Prothrombin Time: 23.2 seconds — ABNORMAL HIGH (ref 11.4–15.2)

## 2022-05-13 SURGERY — CARDIOVERSION
Anesthesia: General

## 2022-05-13 MED ORDER — PROPOFOL 10 MG/ML IV BOLUS
INTRAVENOUS | Status: DC | PRN
  Administered 2022-05-13: 100 mg via INTRAVENOUS

## 2022-05-13 MED ORDER — LIDOCAINE 2% (20 MG/ML) 5 ML SYRINGE
INTRAMUSCULAR | Status: DC | PRN
  Administered 2022-05-13: 60 mg via INTRAVENOUS

## 2022-05-13 MED ORDER — SODIUM CHLORIDE 0.9 % IV SOLN: INTRAVENOUS | Status: DC

## 2022-05-13 SURGICAL SUPPLY — 1 items: ELECT DEFIB PAD ADLT CADENCE (PAD) ×1 IMPLANT

## 2022-05-13 NOTE — CV Procedure (Signed)
    Electrical Cardioversion Procedure Note Gary [name] [id number] [birth year]  Procedure: Electrical Cardioversion Indications:  Atrial Fibrillation  Time Out: Verified patient identification, verified procedure,medications/allergies/relevent history reviewed, required imaging and test results available.  Performed  Procedure Details  The patient was NPO after midnight. Anesthesia was administered at the beside  by Dr.Hodierne with 100mg  of Lidocaine and 60mg  of propofol.  Cardioversion was done with synchronized biphasic defibrillation with AP pads with 150watts.  The patient converted to atrial flutter with CVR.  Cardioversion was done with synchronized biphasic defibrillation with AP pads with 200watts.  The patient converted to NSR.  The patient tolerated the procedure well   IMPRESSION:  Successful cardioversion of atrial fibrillation    Gary [name] 05/13/2022, 9:19 AM

## 2022-05-13 NOTE — Anesthesia Preprocedure Evaluation (Signed)
Anesthesia Evaluation  Patient identified by MRN, date of birth, ID band Patient awake    Reviewed: Allergy & Precautions, H&P , NPO status , Patient's Chart, lab work & pertinent test results  Airway Mallampati: II   Neck ROM: full    Dental   Pulmonary sleep apnea , former smoker   breath sounds clear to auscultation       Cardiovascular hypertension, + CAD, + Past MI and +CHF  + dysrhythmias Atrial Fibrillation  Rhythm:irregular Rate:Normal     Neuro/Psych  PSYCHIATRIC DISORDERS  Depression       GI/Hepatic   Endo/Other  diabetes, Type 2    Renal/GU      Musculoskeletal  (+) Arthritis ,    Abdominal   Peds  Hematology   Anesthesia Other Findings   Reproductive/Obstetrics                             Anesthesia Physical Anesthesia Plan  ASA: 3  Anesthesia Plan: MAC   Post-op Pain Management:    Induction: Intravenous  PONV Risk Score and Plan: 1 and Propofol infusion and Treatment may vary due to age or medical condition  Airway Management Planned: Nasal Cannula  Additional Equipment:   Intra-op Plan:   Post-operative Plan:   Informed Consent: I have reviewed the patients History and Physical, chart, labs and discussed the procedure including the risks, benefits and alternatives for the proposed anesthesia with the patient or authorized representative who has indicated his/her understanding and acceptance.     Dental advisory given  Plan Discussed with: CRNA, Anesthesiologist and Surgeon  Anesthesia Plan Comments:        Anesthesia Quick Evaluation

## 2022-05-13 NOTE — Transfer of Care (Signed)
Immediate Anesthesia Transfer of Care Note  Patient: Gary [name]  Procedure(s) Performed: CARDIOVERSION  Patient Location: Cath Lab  Anesthesia Type:General  Level of Consciousness: drowsy  Airway & Oxygen Therapy: Patient Spontanous Breathing  Post-op Assessment: Report given to RN and Post -op Vital signs reviewed and stable  Post vital signs: Reviewed and stable  Last Vitals:  Vitals Value Taken Time  BP 125/70   Temp    Pulse 73   Resp 16   SpO2 93%     Last Pain:  Vitals:   05/13/22 0828  TempSrc: Temporal         Complications: No notable events documented.

## 2022-05-13 NOTE — Discharge Instructions (Signed)

## 2022-05-13 NOTE — Interval H&P Note (Signed)
History and Physical Interval Note:  05/13/2022 9:18 AM  Gary [name]  has presented today for surgery, with the diagnosis of AFIB.  The various methods of treatment have been discussed with the patient and family. After consideration of risks, benefits and other options for treatment, the patient has consented to  Procedure(s): CARDIOVERSION (N/A) as a surgical intervention.  The patient's history has been reviewed, patient examined, no change in status, stable for surgery.  I have reviewed the patient's chart and labs.  Questions were answered to the patient's satisfaction.     Armanda Magic

## 2022-05-14 ENCOUNTER — Ambulatory Visit: Payer: Self-pay

## 2022-05-14 ENCOUNTER — Encounter (HOSPITAL_COMMUNITY): Payer: Self-pay | Admitting: Cardiology

## 2022-05-14 NOTE — Patient Outreach (Signed)
  Care Coordination   Follow Up Visit Note   05/14/2022 Name: SOPHAT DUBOW MRN: [id number] DOB: [birth year]  TREYSON WALEK is a 77 y.o. year old male who sees Charlane Ferretti, [place] for primary care. I spoke with  [name] by phone today.  What matters to the patients health and wellness today?  Patient would like to stay in normal sinus rhythm and get his shortness of breath under better control.     Goals Addressed             This Visit's Progress    COMPLETED: RN Care Coordination Activities: further follow up required       Care Coordination Interventions: Evaluation of current treatment plan related to aspirational pneumonia and patient's adherence to plan as established by provider Determined Gillian Shields NP reviewed patient's most recent chest xray results, patient unclear of results; Reinforced MD recommendations with patient  Discussed patient plans to ask provider to consider a Pulmonology referral for further evaluation  Educated patient regarding deep breathing exercises to help slow breathing, and remove trapped air Encouraged patient to pace himself and to balance his activity with rest Instructed patient to notify his doctor of new or worsening symptoms promptly      To have evaluation for treatment of A-fib       Care Coordination Interventions: Evaluation of current treatment plan related to persistent Atrial Fib  and patient's adherence to plan as established by provider Determined patient's admission for Tikosyn administration due to patient was not advised to stop Bactrim prior to the procedure (Bactrim is not compatible with Tikosyn) Discussed and reviewed with patient he underwent a Cardioversion yesterday on 05/13/22, he remains to be in normal sinus rhythm  Determined patient's Tikosyn admission may be rescheduled and will be further discussed at next scheduled visit Reviewed upcoming scheduled visits with Coumadin clinic, AF clinic and f/u with  Cardiologist provider Gillian Shields NP         SDOH assessments and interventions completed:  No     Care Coordination Interventions:  Yes, provided   Follow up plan: Follow up call scheduled for 06/30/22 @1 :30 PM    Encounter Outcome:  Pt. Visit Completed

## 2022-05-14 NOTE — Patient Instructions (Signed)
Visit Information  Thank you for taking time to visit with me today. Please don't hesitate to contact me if I can be of assistance to you.   Following are the goals we discussed today:   Goals Addressed             This Visit's Progress    COMPLETED: RN Care Coordination Activities: further follow up required       Care Coordination Interventions: Evaluation of current treatment plan related to aspirational pneumonia and patient's adherence to plan as established by provider Determined Gillian Shields NP reviewed patient's most recent chest xray results, patient unclear of results; Reinforced MD recommendations with patient  Discussed patient plans to ask provider to consider a Pulmonology referral for further evaluation  Educated patient regarding deep breathing exercises to help slow breathing, and remove trapped air Encouraged patient to pace himself and to balance his activity with rest Instructed patient to notify his doctor of new or worsening symptoms promptly      To have evaluation for treatment of A-fib       Care Coordination Interventions: Evaluation of current treatment plan related to persistent Atrial Fib  and patient's adherence to plan as established by provider Determined patient's admission for Tikosyn administration due to patient was not advised to stop Bactrim prior to the procedure (Bactrim is not compatible with Tikosyn) Discussed and reviewed with patient he underwent a Cardioversion yesterday on 05/13/22, he remains to be in normal sinus rhythm  Determined patient's Tikosyn admission may be rescheduled and will be further discussed at next scheduled visit Reviewed upcoming scheduled visits with Coumadin clinic, AF clinic and f/u with Cardiologist provider Gillian Shields NP         Our next appointment is by telephone on 06/30/22 at 1:30 PM  Please call the care guide team at [phone] if you need to cancel or reschedule your appointment.   If you are  experiencing a Mental Health or Behavioral Health Crisis or need someone to talk to, please call 1-800-273-TALK (toll free, 24 hour hotline) go to [facility] [address], [place] [phone])  Patient verbalizes understanding of instructions and care plan provided today and agrees to view in MyChart. Active MyChart status and patient understanding of how to access instructions and care plan via MyChart confirmed with patient.     Delsa Sale, RN, BSN, CCM Care Management Coordinator [facility] Care Management Direct Phone: [phone]

## 2022-05-17 ENCOUNTER — Encounter (HOSPITAL_COMMUNITY): Payer: Self-pay | Admitting: Cardiology

## 2022-05-17 NOTE — Anesthesia Postprocedure Evaluation (Signed)
Anesthesia Post Note  Patient: Gary [name]  Procedure(s) Performed: CARDIOVERSION     Patient location during evaluation: Cath Lab Anesthesia Type: General Level of consciousness: awake and alert Pain management: pain level controlled Vital Signs Assessment: post-procedure vital signs reviewed and stable Respiratory status: spontaneous breathing, nonlabored ventilation, respiratory function stable and patient connected to nasal cannula oxygen Cardiovascular status: blood pressure returned to baseline and stable Postop Assessment: no apparent nausea or vomiting Anesthetic complications: no   No notable events documented.  Last Vitals:  Vitals:   05/13/22 1130 05/13/22 1140  BP: (!) 148/75   Pulse: 68 72  Resp: 17 18  Temp:    SpO2: 100% 95%    Last Pain:  Vitals:   05/13/22 1111  TempSrc: Temporal  PainSc: 0-No pain                 [name]

## 2022-05-19 DIAGNOSIS — M25561 Pain in right knee: Secondary | ICD-10-CM | POA: Diagnosis not present

## 2022-05-19 DIAGNOSIS — R768 Other specified abnormal immunological findings in serum: Secondary | ICD-10-CM | POA: Diagnosis not present

## 2022-05-19 DIAGNOSIS — R5383 Other fatigue: Secondary | ICD-10-CM | POA: Diagnosis not present

## 2022-05-19 DIAGNOSIS — Z6834 Body mass index (BMI) 34.0-34.9, adult: Secondary | ICD-10-CM | POA: Diagnosis not present

## 2022-05-19 DIAGNOSIS — M0579 Rheumatoid arthritis with rheumatoid factor of multiple sites without organ or systems involvement: Secondary | ICD-10-CM | POA: Diagnosis not present

## 2022-05-19 DIAGNOSIS — M6281 Muscle weakness (generalized): Secondary | ICD-10-CM | POA: Diagnosis not present

## 2022-05-19 DIAGNOSIS — Z7952 Long term (current) use of systemic steroids: Secondary | ICD-10-CM | POA: Diagnosis not present

## 2022-05-19 DIAGNOSIS — M3130 Wegener's granulomatosis without renal involvement: Secondary | ICD-10-CM | POA: Diagnosis not present

## 2022-05-19 DIAGNOSIS — E669 Obesity, unspecified: Secondary | ICD-10-CM | POA: Diagnosis not present

## 2022-05-19 DIAGNOSIS — M25562 Pain in left knee: Secondary | ICD-10-CM | POA: Diagnosis not present

## 2022-05-19 DIAGNOSIS — M1991 Primary osteoarthritis, unspecified site: Secondary | ICD-10-CM | POA: Diagnosis not present

## 2022-05-21 ENCOUNTER — Ambulatory Visit: Payer: Medicare Other | Attending: Cardiovascular Disease

## 2022-05-21 DIAGNOSIS — Z7901 Long term (current) use of anticoagulants: Secondary | ICD-10-CM | POA: Diagnosis not present

## 2022-05-21 DIAGNOSIS — I480 Paroxysmal atrial fibrillation: Secondary | ICD-10-CM

## 2022-05-21 LAB — POCT INR: INR: 3.4 — AB (ref 2.0–3.0)

## 2022-05-21 NOTE — Patient Instructions (Signed)
HOLD TOMORROW ONLY THEN Continue taking warfarin 1 tablet daily, except 1/2 tablet Monday, Wednesday and Friday. Tikosyn discussion 5/20.  INR in 2 weeks.  Please call us at [phone] with any questions.

## 2022-05-24 ENCOUNTER — Telehealth: Payer: Self-pay

## 2022-05-24 ENCOUNTER — Ambulatory Visit (HOSPITAL_COMMUNITY)
Admit: 2022-05-24 | Discharge: 2022-05-24 | Disposition: A | Discharge status: Home / Self Care | Payer: Medicare Other | Attending: Internal Medicine | Admitting: Internal Medicine

## 2022-05-24 VITALS — BP 142/68 | HR 50 | Ht 70.0 in | Wt 233.2 lb

## 2022-05-24 DIAGNOSIS — D6869 Other thrombophilia: Secondary | ICD-10-CM

## 2022-05-24 DIAGNOSIS — Z955 Presence of coronary angioplasty implant and graft: Secondary | ICD-10-CM | POA: Insufficient documentation

## 2022-05-24 DIAGNOSIS — I5042 Chronic combined systolic (congestive) and diastolic (congestive) heart failure: Secondary | ICD-10-CM | POA: Insufficient documentation

## 2022-05-24 DIAGNOSIS — Z951 Presence of aortocoronary bypass graft: Secondary | ICD-10-CM | POA: Insufficient documentation

## 2022-05-24 DIAGNOSIS — Z7901 Long term (current) use of anticoagulants: Secondary | ICD-10-CM | POA: Diagnosis not present

## 2022-05-24 DIAGNOSIS — I2510 Atherosclerotic heart disease of native coronary artery without angina pectoris: Secondary | ICD-10-CM | POA: Insufficient documentation

## 2022-05-24 DIAGNOSIS — G4733 Obstructive sleep apnea (adult) (pediatric): Secondary | ICD-10-CM | POA: Diagnosis not present

## 2022-05-24 DIAGNOSIS — I714 Abdominal aortic aneurysm, without rupture, unspecified: Secondary | ICD-10-CM | POA: Insufficient documentation

## 2022-05-24 DIAGNOSIS — I4819 Other persistent atrial fibrillation: Secondary | ICD-10-CM

## 2022-05-24 NOTE — Telephone Encounter (Signed)
Lpmtcb and schedule INR check this week in preparation for Tikosyn admission 6/3.

## 2022-05-24 NOTE — Progress Notes (Signed)
Primary Care Physician: Charlane Ferretti, DO Primary Cardiologist: Dr. Duke Salvia Primary Electrophysiologist: N/A Referring Physician: Dr. Alinda Deem Gary [name] is a 77 y.o. male with a history of hypertension, melanoma, CAD s/p PCI and CABG, mild ascending aortic aneurysm, chronic systolic and diastolic heart failure, hyperlipidemia, paroxysmal atrial fibrillation, and OSA non-compliant with CPAP who presents for consultation in the [facility]. Patient is on coumadin for a CHADS2VASC score of 6.  He underwent scheduled cardioversion on 02/26/22, for afib that was found in January. CV was successful in converting to sinus rhythm on the second shock .Unfortunately, he was admitted from 2/26-3/2 for aspiration pneumonia possibly secondary to  intubation/ DCCV as pt was on Ozempic, which was not stopped and he did not want to delay CV. He appeared to have been in sinus rhythm during admission. He is currently on carvedilol and warfarin. No bleeding concerns. He does state was on eliquis a couple years ago but switched to coumadin due to cost while in the donut hole. He is in rate controlled afib today and was not aware.   He cannot specifically tell me when he went back into Afib since hospital discharge. He admits to overall feeling low energy but it is not new and has felt this way for a while. He definitely feels better than he did 2 weeks ago. Has had Afib for about 4 years intermittently; required cardioversion about a year or so ago. Denies shortness of breath or palpitations. He is now compliant with his CPAP.   On follow up 05/11/22, he is currently in rate controlled Afib. He is here today for Tikosyn admission after plan confirmed at OV with Dr. Elberta Fortis on 04/12/22. Medication list reviewed by pharmacy and no contraindications noted. He has completed 4 therapeutic INR checks being on coumadin. He has not taken benadryl in the last week. He does note some bilateral  ankle swelling since stopping aldactone.  On follow up 05/24/22, he is currently in NSR. S/p successful DCCV on 5/9 to NSR. He was originally scheduled for Tikosyn admission but this was canceled unfortunately due to being on bactrim. This medication was added to his list after pharmacy review. Seen by rheumatologist Dr. Dierdre Forth on 5/15 and bactrim was discontinued; placed on prednisone 5 mg daily. He has continued INR checks as directed and none have been below 2.0; most recent INR on 5/17 was 3.4.   Today, he denies symptoms of palpitations, chest pain, shortness of breath, orthopnea, PND, lower extremity edema, dizziness, presyncope, syncope, snoring, daytime somnolence, bleeding, or neurologic sequela. The patient is tolerating medications without difficulties and is otherwise without complaint today.    Atrial Fibrillation Risk Factors:  he does have symptoms or diagnosis of sleep apnea. he is not compliant with CPAP therapy. he does not have a history of rheumatic fever. he does not have a history of alcohol use. The patient does not have a history of early familial atrial fibrillation or other arrhythmias.  he has a BMI of Body mass index is 33.46 kg/m.Marland Kitchen Filed Weights   05/24/22 1354  Weight: 105.8 kg     Family History  Problem Relation Age of Onset   Heart attack Mother    Other Father        odd stomach disorder   Stroke Maternal Grandmother    Heart attack Maternal Grandfather    Heart attack Paternal Grandmother    Heart attack Paternal Grandfather      Atrial  Fibrillation Management history:  Previous antiarrhythmic drugs: None Previous cardioversions: 02/26/22 Previous ablations: None CHADS2VASC score: 6 Anticoagulation history: Coumadin   Past Medical History:  Diagnosis Date   Aortic aneurysm (HCC) 02/20/2016   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   Arthritis    Atrial fibrillation (HCC) 11/13/2014   Cancer (HCC)    skin   Chronic combined systolic and  diastolic heart failure (HCC) 02/20/2016   LVEF 45-50% 11/2014   Coronary artery disease    Depression    Essential hypertension 11/13/2014   Hyperlipidemia    Hyperlipidemia 11/13/2014   Hypertension    Sleep apnea    CPAP 'Uses half the time"  last study 2009   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   Past Surgical History:  Procedure Laterality Date   CARDIOVERSION N/A 12/06/2014   Procedure: CARDIOVERSION;  Surgeon: Chilton Si, MD;  Location: [facility] ENDOSCOPY;  Service: Cardiovascular;  Laterality: N/A;   CARDIOVERSION N/A 05/26/2017   Procedure: CARDIOVERSION;  Surgeon: Chilton Si, MD;  Location: [facility] ENDOSCOPY;  Service: Cardiovascular;  Laterality: N/A;   CARDIOVERSION N/A 04/25/2019   Procedure: CARDIOVERSION;  Surgeon: Thurmon Fair, MD;  Location: [facility] ENDOSCOPY;  Service: Cardiovascular;  Laterality: N/A;   CARDIOVERSION N/A 02/26/2022   Procedure: CARDIOVERSION;  Surgeon: Chilton Si, MD;  Location: [facility] ENDOSCOPY;  Service: Cardiovascular;  Laterality: N/A;   CARDIOVERSION N/A 05/13/2022   Procedure: CARDIOVERSION;  Surgeon: Quintella Reichert, MD;  Location: [facility] INVASIVE CV LAB;  Service: Cardiovascular;  Laterality: N/A;   CARPAL TUNNEL RELEASE     CORONARY ANGIOPLASTY     multiple stents   CORONARY ARTERY BYPASS GRAFT  2006   CORONARY STENT INTERVENTION N/A 09/28/2021   Procedure: CORONARY STENT INTERVENTION;  Surgeon: Runell Gess, MD;  Location: [facility] INVASIVE CV LAB;  Service: Cardiovascular;  Laterality: N/A;  SVG-PDA   EYE SURGERY     right eye-  muscular repair   LEFT HEART CATH AND CORS/GRAFTS ANGIOGRAPHY N/A 09/28/2021   Procedure: LEFT HEART CATH AND CORS/GRAFTS ANGIOGRAPHY;  Surgeon: Runell Gess, MD;  Location: [facility] INVASIVE CV LAB;  Service: Cardiovascular;  Laterality: N/A;   TOTAL HIP ARTHROPLASTY  01/03/2012   Procedure: TOTAL HIP ARTHROPLASTY;  Surgeon: Jacki Cones, MD;  Location: [facility] ORS;  Service: Orthopedics;  Laterality: Right;    Current  Outpatient Medications  Medication Sig Dispense Refill   ALPRAZolam (XANAX) 0.5 MG tablet Take 0.5 mg by mouth at bedtime.     amLODipine (NORVASC) 2.5 MG tablet Take 2.5 mg by mouth daily.     calcium carbonate (OS-CAL) 600 MG TABS tablet Take 600 mg by mouth daily with breakfast.     carvedilol (COREG) 25 MG tablet Take 0.5 tablets (12.5 mg total) by mouth 2 (two) times daily.     Cholecalciferol (VITAMIN D3) 125 MCG (5000 UT) TABS Take 5,000 Units by mouth daily.     clopidogrel (PLAVIX) 75 MG tablet Take 1 tablet (75 mg total) by mouth daily with breakfast. Take every day for 1 year (Patient taking differently: Take 75 mg by mouth daily with breakfast.) 90 tablet 3   Coenzyme Q10 (CO Q 10 PO) Take 400 mg by mouth daily.     Evolocumab (REPATHA SURECLICK) 140 MG/ML SOAJ INJECT 140 MG INTO THE SKIN EVERY 14 (FOURTEEN) DAYS. INJECT 1 PEN INTO THE FATTY TISSUE SKIN EVERY 14 DAY (Patient taking differently: Inject 140 mg into the skin every 14 (fourteen) days.) 6 mL 3   ferrous sulfate 325 (65 FE)  MG EC tablet Take 325 mg by mouth at bedtime.     furosemide (LASIX) 40 MG tablet Take 1 tablet (40 mg total) by mouth daily as needed. 30 tablet 3   levothyroxine (SYNTHROID, LEVOTHROID) 137 MCG tablet Take 137 mcg by mouth daily before breakfast.     loratadine (CLARITIN) 10 MG tablet Take 10 mg by mouth daily.     metFORMIN (GLUCOPHAGE) 500 MG tablet Take 500 mg by mouth 2 (two) times daily.  12   Multiple Vitamin (MULTIVITAMIN WITH MINERALS) TABS tablet Take 1 tablet by mouth daily. Centrum silver men     nitroGLYCERIN (NITROSTAT) 0.4 MG SL tablet Place 1 tablet (0.4 mg total) under the tongue every 5 (five) minutes x 3 doses as needed for chest pain. 25 tablet 12   pantoprazole (PROTONIX) 40 MG tablet Take 40 mg by mouth 2 (two) times daily.     predniSONE (DELTASONE) 5 MG tablet Take 5 mg by mouth daily with breakfast.     riTUXimab (RITUXAN IV) Inject 1 application  into the vein See admin  instructions. Take every two weeks and every six months alternating     spironolactone (ALDACTONE) 25 MG tablet Take 0.5 tablets (12.5 mg total) by mouth daily. 30 tablet 0   testosterone cypionate (DEPOTESTOTERONE CYPIONATE) 100 MG/ML injection Inject 100 mg into the muscle every 14 (fourteen) days.     venlafaxine XR (EFFEXOR-XR) 150 MG 24 hr capsule Take 150 mg by mouth daily.      warfarin (COUMADIN) 5 MG tablet TAKE 1/2 TABLET TO 1 TABLET BY MOUTH DAILY AS DIRECTED BY COUMADIN CLINIC (Patient taking differently: Take 2.5-5 mg by mouth See admin instructions. Take 2.5 mg (1/2 tablet) by mouth on Monday, Wednesday, and Friday's, and take 5 mg (1 tablet) on every other day of the week.) 75 tablet 0   Zinc Chelated 22.5 MG TABS Take 22.5 mg by mouth daily.     No current facility-administered medications for this encounter.    Allergies  Allergen Reactions   Farxiga [Dapagliflozin]     Lightheaded and low blood pressure    Statins Other (See Comments)    Muscle pain    Social History   Socioeconomic History   Marital status: Married    Spouse name: Not on file   Number of children: Not on file   Years of education: Not on file   Highest education level: Not on file  Occupational History   Not on file  Tobacco Use   Smoking status: Former    Types: Cigarettes    Quit date: 12/24/1983    Years since quitting: 38.4   Smokeless tobacco: Never  Vaping Use   Vaping Use: Never used  Substance and Sexual Activity   Alcohol use: No    Alcohol/week: 0.0 standard drinks of alcohol   Drug use: No   Sexual activity: Not on file  Other Topics Concern   Not on file  Social History Narrative   Not on file   Social Determinants of Health   Financial Resource Strain: Not on file  Food Insecurity: No Food Insecurity (03/02/2022)   Hunger Vital Sign    Worried About Running Out of Food in the Last Year: Never true    Ran Out of Food in the Last Year: Never true  Transportation Needs:  No Transportation Needs (03/09/2022)   PRAPARE - Administrator, Civil Service (Medical): No    Lack of Transportation (Non-Medical): No  Physical Activity: Not on file  Stress: Not on file  Social Connections: Not on file  Intimate Partner Violence: Not At Risk (03/02/2022)   Humiliation, Afraid, Rape, and Kick questionnaire    Fear of Current or Ex-Partner: No    Emotionally Abused: No    Physically Abused: No    Sexually Abused: No     ROS- All systems are reviewed and negative except as per the HPI above.  Physical Exam: Vitals:   05/24/22 1354  BP: (!) 142/68  Pulse: (!) 50  Weight: 105.8 kg  Height: 5\' 10"  (1.778 m)    GEN- The patient is well appearing, alert and oriented x 3 today.   Head- normocephalic, atraumatic Eyes-  Sclera clear, conjunctiva pink Ears- hearing intact Lungs- Clear to ausculation bilaterally, normal work of breathing Heart- Regular bradycardic rate and rhythm, no murmurs, rubs or gallops, PMI not laterally displaced Extremities- no clubbing, cyanosis, or edema MS- no significant deformity or atrophy Skin- no rash or lesion Psych- euthymic mood, full affect Neuro- strength and sensation are intact   Wt Readings from Last 3 Encounters:  05/24/22 105.8 kg  05/13/22 104.3 kg  05/11/22 106.6 kg    EKG today demonstrates  Vent. rate 50 BPM PR interval 176 ms QRS duration 102 ms QT/QTcB 470/428 ms P-R-T axes 71 -47 12 Sinus bradycardia with sinus arrhythmia Left anterior fascicular block Nonspecific ST abnormality Abnormal ECG When compared with ECG of 13-May-2022 11:08, PREVIOUS ECG IS PRESENT  Echo 03/02/22 demonstrated    1. Left ventricular ejection fraction, by estimation, is 50%. The left  ventricle has mildly decreased function. The left ventricle demonstrates  global hypokinesis. There is moderate concentric left ventricular  hypertrophy. Left ventricular diastolic  parameters are consistent with Grade II diastolic  dysfunction  (pseudonormalization). Elevated left atrial pressure.   2. Right ventricular systolic function is mildly reduced. The right  ventricular size is normal. There is mildly elevated pulmonary artery  systolic pressure. The estimated right ventricular systolic pressure is  35.5 mmHg.   3. Left atrial size was severely dilated.   4. The mitral valve is normal in structure. Trivial mitral valve  regurgitation. No evidence of mitral stenosis. Moderate mitral annular  calcification.   5. The aortic valve is tricuspid. There is mild calcification of the  aortic valve. There is mild thickening of the aortic valve. Aortic valve  regurgitation is not visualized. Aortic valve sclerosis/calcification is  present, without any evidence of  aortic stenosis.   6. The inferior vena cava is normal in size with greater than 50%  respiratory variability, suggesting right atrial pressure of 3 mmHg.   Comparison(s): No significant change from prior study. Prior images  reviewed side by side.   Heart cath 09/28/21:    Prox LAD to Mid LAD lesion is 100% stenosed.   Ost LAD to Prox LAD lesion is 100% stenosed.   Ramus lesion is 100% stenosed.   Prox RCA lesion is 100% stenosed.   Prox RCA to Dist RCA lesion is 100% stenosed.   Ost Cx to Prox Cx lesion is 30% stenosed.   Dist Graft to Insertion lesion is 99% stenosed.   A drug-eluting stent was successfully placed using a SYNERGY XD [id number].   Post intervention, there is a 0% residual stenosis.  Epic records are reviewed at length today  CHA2DS2-VASc Score = 6  The patient's score is based upon: CHF History: 1 HTN History: 1 Diabetes History: 1 Stroke History: 0 Vascular  Disease History: 1 Age Score: 2 Gender Score: 0       ASSESSMENT AND PLAN: Persistent Atrial Fibrillation (ICD10:  I48.0) The patient's CHA2DS2-VASc score is 6, indicating a 9.7% annual risk of stroke.    He is in NSR today. S/p DCCV on 5/9 with successful  conversion.  After discussion, agreed to proceed with rescheduling for Tikosyn admission.   QTc in SR 380 ms (ECG on 03/02/22) Labs 05/11/22 show creatinine at 1.04, K+ 4.2; mag level 2.0, CrCl calculated at 86 mL/min  He is eligible for 500 mcg BID dosage.     2. Secondary Hypercoagulable State (ICD10:  D68.69) The patient is at significant risk for stroke/thromboembolism based upon his CHA2DS2-VASc Score of 6.  Continue Warfarin (Coumadin).   Continue coumadin and INR checks as directed.   He will be rescheduled for Tikosyn admission.  [name], PA-C Afib Clinic [facility] [address] [place], [place] [address] [phone]

## 2022-05-25 ENCOUNTER — Telehealth: Payer: Self-pay | Admitting: Cardiology

## 2022-05-25 ENCOUNTER — Other Ambulatory Visit (HOSPITAL_COMMUNITY): Payer: Self-pay | Admitting: *Deleted

## 2022-05-25 ENCOUNTER — Other Ambulatory Visit: Payer: Self-pay | Admitting: Cardiovascular Disease

## 2022-05-25 DIAGNOSIS — I480 Paroxysmal atrial fibrillation: Secondary | ICD-10-CM

## 2022-05-25 DIAGNOSIS — Z7901 Long term (current) use of anticoagulants: Secondary | ICD-10-CM

## 2022-05-25 MED ORDER — FAMOTIDINE 20 MG PO TABS: 20.0000 mg | ORAL_TABLET | Freq: Two times a day (BID) | ORAL | Status: DC

## 2022-05-25 NOTE — Telephone Encounter (Signed)
Please review for refill. Thank you! 

## 2022-05-25 NOTE — Telephone Encounter (Addendum)
Warfarin 5mg  refill Afib Last INR 05/21/22 Last OV 05/24/22

## 2022-05-25 NOTE — Telephone Encounter (Signed)
Patient calling in about when his next appt will be with dr. Please advise

## 2022-05-25 NOTE — Telephone Encounter (Signed)
Attempted to reach pt, no answer, no voice mail

## 2022-05-28 ENCOUNTER — Ambulatory Visit: Payer: Medicare Other | Attending: Cardiovascular Disease

## 2022-05-28 DIAGNOSIS — E1169 Type 2 diabetes mellitus with other specified complication: Secondary | ICD-10-CM | POA: Diagnosis not present

## 2022-05-28 DIAGNOSIS — I110 Hypertensive heart disease with heart failure: Secondary | ICD-10-CM | POA: Diagnosis not present

## 2022-05-28 DIAGNOSIS — E785 Hyperlipidemia, unspecified: Secondary | ICD-10-CM | POA: Diagnosis not present

## 2022-05-28 DIAGNOSIS — R0602 Shortness of breath: Secondary | ICD-10-CM | POA: Diagnosis not present

## 2022-05-28 DIAGNOSIS — I480 Paroxysmal atrial fibrillation: Secondary | ICD-10-CM | POA: Diagnosis not present

## 2022-05-28 DIAGNOSIS — J9611 Chronic respiratory failure with hypoxia: Secondary | ICD-10-CM | POA: Diagnosis not present

## 2022-05-28 DIAGNOSIS — Z7901 Long term (current) use of anticoagulants: Secondary | ICD-10-CM | POA: Diagnosis not present

## 2022-05-28 DIAGNOSIS — E039 Hypothyroidism, unspecified: Secondary | ICD-10-CM | POA: Diagnosis not present

## 2022-05-28 DIAGNOSIS — I5022 Chronic systolic (congestive) heart failure: Secondary | ICD-10-CM | POA: Diagnosis not present

## 2022-05-28 DIAGNOSIS — C439 Malignant melanoma of skin, unspecified: Secondary | ICD-10-CM | POA: Diagnosis not present

## 2022-05-28 DIAGNOSIS — E291 Testicular hypofunction: Secondary | ICD-10-CM | POA: Diagnosis not present

## 2022-05-28 DIAGNOSIS — D849 Immunodeficiency, unspecified: Secondary | ICD-10-CM | POA: Diagnosis not present

## 2022-05-28 DIAGNOSIS — J849 Interstitial pulmonary disease, unspecified: Secondary | ICD-10-CM | POA: Diagnosis not present

## 2022-05-28 DIAGNOSIS — D500 Iron deficiency anemia secondary to blood loss (chronic): Secondary | ICD-10-CM | POA: Diagnosis not present

## 2022-05-28 LAB — POCT INR: INR: 2.9 (ref 2.0–3.0)

## 2022-05-28 NOTE — Patient Instructions (Signed)
Continue taking warfarin 1 tablet daily, except 1/2 tablet Monday, Wednesday and Friday. Tikosyn admission 6/3.  INR in 1 week.  Please call us at [phone] with any questions.

## 2022-06-01 ENCOUNTER — Other Ambulatory Visit: Payer: Self-pay | Admitting: Internal Medicine

## 2022-06-01 ENCOUNTER — Encounter (HOSPITAL_COMMUNITY): Payer: Self-pay

## 2022-06-01 DIAGNOSIS — J984 Other disorders of lung: Secondary | ICD-10-CM

## 2022-06-01 DIAGNOSIS — J849 Interstitial pulmonary disease, unspecified: Secondary | ICD-10-CM

## 2022-06-03 DIAGNOSIS — R0602 Shortness of breath: Secondary | ICD-10-CM | POA: Diagnosis not present

## 2022-06-03 NOTE — Telephone Encounter (Signed)
Pt was needing to discuss coumadin INR check time for this coming Monday. He will address it tomorrow when he sees the Coumadin clinic. He appreciates the return call.

## 2022-06-04 ENCOUNTER — Ambulatory Visit (INDEPENDENT_AMBULATORY_CARE_PROVIDER_SITE_OTHER): Payer: Medicare Other

## 2022-06-04 DIAGNOSIS — Z7901 Long term (current) use of anticoagulants: Secondary | ICD-10-CM

## 2022-06-04 DIAGNOSIS — Z888 Allergy status to other drugs, medicaments and biological substances status: Secondary | ICD-10-CM | POA: Diagnosis not present

## 2022-06-04 DIAGNOSIS — Z955 Presence of coronary angioplasty implant and graft: Secondary | ICD-10-CM | POA: Diagnosis not present

## 2022-06-04 DIAGNOSIS — Z951 Presence of aortocoronary bypass graft: Secondary | ICD-10-CM | POA: Diagnosis not present

## 2022-06-04 DIAGNOSIS — I4819 Other persistent atrial fibrillation: Secondary | ICD-10-CM | POA: Diagnosis not present

## 2022-06-04 DIAGNOSIS — I480 Paroxysmal atrial fibrillation: Secondary | ICD-10-CM | POA: Diagnosis not present

## 2022-06-04 DIAGNOSIS — Z8701 Personal history of pneumonia (recurrent): Secondary | ICD-10-CM | POA: Diagnosis not present

## 2022-06-04 DIAGNOSIS — F32A Depression, unspecified: Secondary | ICD-10-CM | POA: Diagnosis not present

## 2022-06-04 DIAGNOSIS — Z85820 Personal history of malignant melanoma of skin: Secondary | ICD-10-CM | POA: Diagnosis not present

## 2022-06-04 DIAGNOSIS — M3130 Wegener's granulomatosis without renal involvement: Secondary | ICD-10-CM | POA: Diagnosis not present

## 2022-06-04 DIAGNOSIS — Z7902 Long term (current) use of antithrombotics/antiplatelets: Secondary | ICD-10-CM | POA: Diagnosis not present

## 2022-06-04 DIAGNOSIS — I5042 Chronic combined systolic (congestive) and diastolic (congestive) heart failure: Secondary | ICD-10-CM | POA: Diagnosis not present

## 2022-06-04 DIAGNOSIS — I7121 Aneurysm of the ascending aorta, without rupture: Secondary | ICD-10-CM | POA: Diagnosis not present

## 2022-06-04 DIAGNOSIS — Z91199 Patient's noncompliance with other medical treatment and regimen due to unspecified reason: Secondary | ICD-10-CM | POA: Diagnosis not present

## 2022-06-04 DIAGNOSIS — Z87891 Personal history of nicotine dependence: Secondary | ICD-10-CM | POA: Diagnosis not present

## 2022-06-04 DIAGNOSIS — D6869 Other thrombophilia: Secondary | ICD-10-CM | POA: Diagnosis not present

## 2022-06-04 DIAGNOSIS — I2510 Atherosclerotic heart disease of native coronary artery without angina pectoris: Secondary | ICD-10-CM | POA: Diagnosis present

## 2022-06-04 DIAGNOSIS — E785 Hyperlipidemia, unspecified: Secondary | ICD-10-CM | POA: Diagnosis not present

## 2022-06-04 DIAGNOSIS — I110 Hypertensive heart disease with heart failure: Secondary | ICD-10-CM | POA: Diagnosis not present

## 2022-06-04 DIAGNOSIS — M1990 Unspecified osteoarthritis, unspecified site: Secondary | ICD-10-CM | POA: Diagnosis present

## 2022-06-04 DIAGNOSIS — Z8249 Family history of ischemic heart disease and other diseases of the circulatory system: Secondary | ICD-10-CM | POA: Diagnosis not present

## 2022-06-04 DIAGNOSIS — Z79899 Other long term (current) drug therapy: Secondary | ICD-10-CM | POA: Diagnosis not present

## 2022-06-04 DIAGNOSIS — Z96641 Presence of right artificial hip joint: Secondary | ICD-10-CM | POA: Diagnosis present

## 2022-06-04 DIAGNOSIS — R001 Bradycardia, unspecified: Secondary | ICD-10-CM | POA: Diagnosis present

## 2022-06-04 DIAGNOSIS — G4733 Obstructive sleep apnea (adult) (pediatric): Secondary | ICD-10-CM | POA: Diagnosis present

## 2022-06-04 LAB — POCT INR: INR: 2.3 (ref 2.0–3.0)

## 2022-06-04 NOTE — Patient Instructions (Signed)
Continue taking warfarin 1 tablet daily, except 1/2 tablet Monday, Wednesday and Friday. Tikosyn admission 6/3.  INR Monday 6/3.  Please call us at [phone] with any questions.

## 2022-06-05 ENCOUNTER — Other Ambulatory Visit: Payer: Self-pay | Admitting: Internal Medicine

## 2022-06-07 ENCOUNTER — Ambulatory Visit (INDEPENDENT_AMBULATORY_CARE_PROVIDER_SITE_OTHER): Payer: Medicare Other | Admitting: *Deleted

## 2022-06-07 ENCOUNTER — Ambulatory Visit (HOSPITAL_COMMUNITY)
Admission: RE | Admit: 2022-06-07 | Discharge: 2022-06-07 | Disposition: A | Discharge status: Home / Self Care | Payer: Medicare Other | Source: Ambulatory Visit | Attending: Internal Medicine | Admitting: Internal Medicine

## 2022-06-07 ENCOUNTER — Ambulatory Visit: Payer: Medicare Other

## 2022-06-07 ENCOUNTER — Inpatient Hospital Stay (HOSPITAL_COMMUNITY)
Admission: RE | Admit: 2022-06-07 | Discharge: 2022-06-10 | DRG: 309 | Disposition: A | Discharge status: Home / Self Care | Payer: Medicare Other | Attending: Cardiology | Admitting: Cardiology

## 2022-06-07 ENCOUNTER — Other Ambulatory Visit (HOSPITAL_COMMUNITY): Payer: Self-pay

## 2022-06-07 VITALS — BP 144/80 | HR 57 | Ht 70.0 in | Wt 229.2 lb

## 2022-06-07 DIAGNOSIS — G4733 Obstructive sleep apnea (adult) (pediatric): Secondary | ICD-10-CM | POA: Diagnosis present

## 2022-06-07 DIAGNOSIS — Z79899 Other long term (current) drug therapy: Secondary | ICD-10-CM | POA: Diagnosis not present

## 2022-06-07 DIAGNOSIS — I110 Hypertensive heart disease with heart failure: Secondary | ICD-10-CM | POA: Diagnosis present

## 2022-06-07 DIAGNOSIS — Z7902 Long term (current) use of antithrombotics/antiplatelets: Secondary | ICD-10-CM | POA: Diagnosis not present

## 2022-06-07 DIAGNOSIS — Z87891 Personal history of nicotine dependence: Secondary | ICD-10-CM | POA: Diagnosis not present

## 2022-06-07 DIAGNOSIS — D6869 Other thrombophilia: Secondary | ICD-10-CM

## 2022-06-07 DIAGNOSIS — Z8701 Personal history of pneumonia (recurrent): Secondary | ICD-10-CM

## 2022-06-07 DIAGNOSIS — Z96641 Presence of right artificial hip joint: Secondary | ICD-10-CM | POA: Diagnosis present

## 2022-06-07 DIAGNOSIS — M1990 Unspecified osteoarthritis, unspecified site: Secondary | ICD-10-CM | POA: Diagnosis present

## 2022-06-07 DIAGNOSIS — Z955 Presence of coronary angioplasty implant and graft: Secondary | ICD-10-CM

## 2022-06-07 DIAGNOSIS — I4819 Other persistent atrial fibrillation: Secondary | ICD-10-CM

## 2022-06-07 DIAGNOSIS — F32A Depression, unspecified: Secondary | ICD-10-CM | POA: Diagnosis present

## 2022-06-07 DIAGNOSIS — I2510 Atherosclerotic heart disease of native coronary artery without angina pectoris: Secondary | ICD-10-CM | POA: Diagnosis present

## 2022-06-07 DIAGNOSIS — Z951 Presence of aortocoronary bypass graft: Secondary | ICD-10-CM | POA: Insufficient documentation

## 2022-06-07 DIAGNOSIS — I5042 Chronic combined systolic (congestive) and diastolic (congestive) heart failure: Secondary | ICD-10-CM | POA: Diagnosis present

## 2022-06-07 DIAGNOSIS — I7121 Aneurysm of the ascending aorta, without rupture: Secondary | ICD-10-CM | POA: Diagnosis present

## 2022-06-07 DIAGNOSIS — I480 Paroxysmal atrial fibrillation: Secondary | ICD-10-CM | POA: Diagnosis not present

## 2022-06-07 DIAGNOSIS — E785 Hyperlipidemia, unspecified: Secondary | ICD-10-CM | POA: Diagnosis present

## 2022-06-07 DIAGNOSIS — Z79890 Hormone replacement therapy (postmenopausal): Secondary | ICD-10-CM

## 2022-06-07 DIAGNOSIS — Z7901 Long term (current) use of anticoagulants: Secondary | ICD-10-CM

## 2022-06-07 DIAGNOSIS — Z85820 Personal history of malignant melanoma of skin: Secondary | ICD-10-CM | POA: Insufficient documentation

## 2022-06-07 DIAGNOSIS — Z7984 Long term (current) use of oral hypoglycemic drugs: Secondary | ICD-10-CM

## 2022-06-07 DIAGNOSIS — M3130 Wegener's granulomatosis without renal involvement: Secondary | ICD-10-CM | POA: Diagnosis present

## 2022-06-07 DIAGNOSIS — R001 Bradycardia, unspecified: Secondary | ICD-10-CM | POA: Diagnosis present

## 2022-06-07 DIAGNOSIS — Z888 Allergy status to other drugs, medicaments and biological substances status: Secondary | ICD-10-CM

## 2022-06-07 DIAGNOSIS — Z91199 Patient's noncompliance with other medical treatment and regimen due to unspecified reason: Secondary | ICD-10-CM | POA: Diagnosis not present

## 2022-06-07 DIAGNOSIS — Z8249 Family history of ischemic heart disease and other diseases of the circulatory system: Secondary | ICD-10-CM | POA: Diagnosis not present

## 2022-06-07 DIAGNOSIS — Z7952 Long term (current) use of systemic steroids: Secondary | ICD-10-CM

## 2022-06-07 LAB — BASIC METABOLIC PANEL
Anion gap: 12 (ref 5–15)
BUN: 18 mg/dL (ref 8–23)
CO2: 26 mmol/L (ref 22–32)
Calcium: 9.2 mg/dL (ref 8.9–10.3)
Chloride: 102 mmol/L (ref 98–111)
Creatinine, Ser: 0.91 mg/dL (ref 0.61–1.24)
GFR, Estimated: >60 mL/min (ref >60)
Glucose, Bld: 116 mg/dL — ABNORMAL HIGH (ref 70–99)
Potassium: 3.8 mmol/L (ref 3.5–5.1)
Sodium: 140 mmol/L (ref 135–145)

## 2022-06-07 LAB — POCT INR: POC INR: 2.4

## 2022-06-07 LAB — MAGNESIUM: Magnesium: 2.1 mg/dL (ref 1.7–2.4)

## 2022-06-07 MED ORDER — SODIUM CHLORIDE 0.9 % IV SOLN: 250.0000 mL | INTRAVENOUS | Status: DC | PRN

## 2022-06-07 MED ORDER — POTASSIUM CHLORIDE CRYS ER 20 MEQ PO TBCR
40.0000 meq | EXTENDED_RELEASE_TABLET | Freq: Once | ORAL | Status: AC
  Administered 2022-06-07: 40 meq via ORAL
  Filled 2022-06-07: qty 2

## 2022-06-07 MED ORDER — PREDNISONE 5 MG PO TABS
5.0000 mg | ORAL_TABLET | Freq: Every day | ORAL | Status: DC
  Administered 2022-06-08 – 2022-06-10 (×3): 5 mg via ORAL
  Filled 2022-06-07 (×3): qty 1

## 2022-06-07 MED ORDER — PANTOPRAZOLE SODIUM 40 MG PO TBEC
40.0000 mg | DELAYED_RELEASE_TABLET | Freq: Two times a day (BID) | ORAL | Status: DC
  Administered 2022-06-07 – 2022-06-10 (×6): 40 mg via ORAL
  Filled 2022-06-07 (×6): qty 1

## 2022-06-07 MED ORDER — AMLODIPINE BESYLATE 2.5 MG PO TABS
2.5000 mg | ORAL_TABLET | Freq: Every day | ORAL | Status: DC
  Administered 2022-06-08 – 2022-06-10 (×3): 2.5 mg via ORAL
  Filled 2022-06-07 (×3): qty 1

## 2022-06-07 MED ORDER — CLOPIDOGREL BISULFATE 75 MG PO TABS
75.0000 mg | ORAL_TABLET | Freq: Every day | ORAL | Status: DC
  Administered 2022-06-08 – 2022-06-10 (×3): 75 mg via ORAL
  Filled 2022-06-07 (×3): qty 1

## 2022-06-07 MED ORDER — WARFARIN SODIUM 2.5 MG PO TABS: 2.5000 mg | ORAL_TABLET | ORAL | Status: DC

## 2022-06-07 MED ORDER — WARFARIN SODIUM 5 MG PO TABS
5.0000 mg | ORAL_TABLET | ORAL | Status: DC
  Administered 2022-06-08: 5 mg via ORAL
  Filled 2022-06-07: qty 1

## 2022-06-07 MED ORDER — WARFARIN - PHARMACIST DOSING INPATIENT: Freq: Every day | Status: DC

## 2022-06-07 MED ORDER — METFORMIN HCL 500 MG PO TABS
500.0000 mg | ORAL_TABLET | Freq: Two times a day (BID) | ORAL | Status: DC
  Administered 2022-06-07 – 2022-06-10 (×6): 500 mg via ORAL
  Filled 2022-06-07 (×6): qty 1

## 2022-06-07 MED ORDER — LEVOTHYROXINE SODIUM 25 MCG PO TABS
137.0000 ug | ORAL_TABLET | Freq: Every day | ORAL | Status: DC
  Administered 2022-06-09 – 2022-06-10 (×2): 137 ug via ORAL
  Filled 2022-06-07 (×2): qty 1

## 2022-06-07 MED ORDER — SODIUM CHLORIDE 0.9% FLUSH
3.0000 mL | Freq: Two times a day (BID) | INTRAVENOUS | Status: DC
  Administered 2022-06-07 – 2022-06-10 (×6): 3 mL via INTRAVENOUS

## 2022-06-07 MED ORDER — ORAL CARE MOUTH RINSE: 15.0000 mL | OROMUCOSAL | Status: DC | PRN

## 2022-06-07 MED ORDER — WARFARIN SODIUM 5 MG PO TABS: 5.0000 mg | ORAL_TABLET | ORAL | Status: DC

## 2022-06-07 MED ORDER — SODIUM CHLORIDE 0.9% FLUSH: 3.0000 mL | INTRAVENOUS | Status: DC | PRN

## 2022-06-07 MED ORDER — DOFETILIDE 500 MCG PO CAPS
500.0000 ug | ORAL_CAPSULE | Freq: Two times a day (BID) | ORAL | Status: DC
  Administered 2022-06-07 – 2022-06-10 (×6): 500 ug via ORAL
  Filled 2022-06-07 (×6): qty 1

## 2022-06-07 MED ORDER — VENLAFAXINE HCL ER 150 MG PO CP24
150.0000 mg | ORAL_CAPSULE | Freq: Every day | ORAL | Status: DC
  Administered 2022-06-08 – 2022-06-10 (×3): 150 mg via ORAL
  Filled 2022-06-07 (×3): qty 1

## 2022-06-07 MED ORDER — LORATADINE 10 MG PO TABS
10.0000 mg | ORAL_TABLET | Freq: Every day | ORAL | Status: DC
  Administered 2022-06-08 – 2022-06-10 (×3): 10 mg via ORAL
  Filled 2022-06-07 (×3): qty 1

## 2022-06-07 MED ORDER — CARVEDILOL 12.5 MG PO TABS
12.5000 mg | ORAL_TABLET | Freq: Two times a day (BID) | ORAL | Status: DC
  Administered 2022-06-07 – 2022-06-10 (×6): 12.5 mg via ORAL
  Filled 2022-06-07 (×6): qty 1

## 2022-06-07 MED ORDER — FAMOTIDINE 20 MG PO TABS
20.0000 mg | ORAL_TABLET | Freq: Two times a day (BID) | ORAL | Status: DC
  Administered 2022-06-07 – 2022-06-10 (×6): 20 mg via ORAL
  Filled 2022-06-07 (×6): qty 1

## 2022-06-07 NOTE — Patient Instructions (Addendum)
Description   Continue taking warfarin 1 tablet daily, except 1/2 tablet Monday, Wednesday and Friday. Tikosyn admission 6/3.  INR 6/13- 1 week post hospital admission. Please call us at [phone] with any questions.

## 2022-06-07 NOTE — TOC Benefit Eligibility Note (Signed)
Patient Product/process development scientist completed.    The patient is currently admitted and upon discharge could be taking dofetilide (Tikosyn) 500 mcg capsules.  The current 30 day co-pay is $39.29.   The patient is insured through [employer]   This test claim was processed through [facility]- copay amounts may vary at other pharmacies due to pharmacy/plan contracts, or as the patient moves through the different stages of their insurance plan.  Roland Earl, CPHT Pharmacy Patient Advocate Specialist [facility] Patient Advocate Team Direct Number: [phone]  Fax: [phone]

## 2022-06-07 NOTE — Progress Notes (Signed)
Primary Care Physician: Charlane Ferretti, DO Primary Cardiologist: Dr. Duke Salvia Primary Electrophysiologist: N/A Referring Physician: Dr. Alinda Deem Gary [name] is a 77 y.o. male with a history of hypertension, melanoma, CAD s/p PCI and CABG, mild ascending aortic aneurysm, chronic systolic and diastolic heart failure, hyperlipidemia, paroxysmal atrial fibrillation, and OSA non-compliant with CPAP who presents for consultation in the [facility]. Patient is on coumadin for a CHADS2VASC score of 6.  He underwent scheduled cardioversion on 02/26/22, for afib that was found in January. CV was successful in converting to sinus rhythm on the second shock .Unfortunately, he was admitted from 2/26-3/2 for aspiration pneumonia possibly secondary to  intubation/ DCCV as pt was on Ozempic, which was not stopped and he did not want to delay CV. He appeared to have been in sinus rhythm during admission. He is currently on carvedilol and warfarin. No bleeding concerns. He does state was on eliquis a couple years ago but switched to coumadin due to cost while in the donut hole. He is in rate controlled afib today and was not aware.   He cannot specifically tell me when he went back into Afib since hospital discharge. He admits to overall feeling low energy but it is not new and has felt this way for a while. He definitely feels better than he did 2 weeks ago. Has had Afib for about 4 years intermittently; required cardioversion about a year or so ago. Denies shortness of breath or palpitations. He is now compliant with his CPAP.   On follow up 05/11/22, he is currently in rate controlled Afib. He is here today for Tikosyn admission after plan confirmed at OV with Dr. Elberta Fortis on 04/12/22. Medication list reviewed by pharmacy and no contraindications noted. He has completed 4 therapeutic INR checks being on coumadin. He has not taken benadryl in the last week. He does note some bilateral  ankle swelling since stopping aldactone.  On follow up 05/24/22, he is currently in NSR. S/p successful DCCV on 5/9 to NSR. He was originally scheduled for Tikosyn admission but this was canceled unfortunately due to being on bactrim. This medication was added to his list after pharmacy review. Seen by rheumatologist Dr. Dierdre Forth on 5/15 and bactrim was discontinued; placed on prednisone 5 mg daily. He has continued INR checks as directed and none have been below 2.0; most recent INR on 5/17 was 3.4.   On follow up 06/07/22, he is currently in NSR. He is here today for Tikosyn admission. He is currently on prednisone daily instead of bactrim. He has completed 4 therapeutic INR checks. He has not begun any new medications since last office visit. He has taken all his morning medications as directed.  Today, he denies symptoms of palpitations, chest pain, shortness of breath, orthopnea, PND, lower extremity edema, dizziness, presyncope, syncope, snoring, daytime somnolence, bleeding, or neurologic sequela. The patient is tolerating medications without difficulties and is otherwise without complaint today.    Atrial Fibrillation Risk Factors:  he does have symptoms or diagnosis of sleep apnea. he is not compliant with CPAP therapy. he does not have a history of rheumatic fever. he does not have a history of alcohol use. The patient does not have a history of early familial atrial fibrillation or other arrhythmias.  he has a BMI of Body mass index is 32.89 kg/m.Marland Kitchen Filed Weights   06/07/22 1042  Weight: 104 kg      Family History  Problem Relation Age  of Onset   Heart attack Mother    Other Father        odd stomach disorder   Stroke Maternal Grandmother    Heart attack Maternal Grandfather    Heart attack Paternal Grandmother    Heart attack Paternal Grandfather      Atrial Fibrillation Management history:  Previous antiarrhythmic drugs: None Previous cardioversions: 02/26/22 Previous  ablations: None CHADS2VASC score: 6 Anticoagulation history: Coumadin   Past Medical History:  Diagnosis Date   Aortic aneurysm (HCC) 02/20/2016   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   Arthritis    Atrial fibrillation (HCC) 11/13/2014   Cancer (HCC)    skin   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/2016   LVEF 45-50% 11/2014   Coronary artery disease    Depression    Essential hypertension 11/13/2014   Hyperlipidemia    Hyperlipidemia 11/13/2014   Hypertension    Sleep apnea    CPAP 'Uses half the time"  last study 2009   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   Past Surgical History:  Procedure Laterality Date   CARDIOVERSION N/A 12/06/2014   Procedure: CARDIOVERSION;  Surgeon: Chilton Si, MD;  Location: [facility] ENDOSCOPY;  Service: Cardiovascular;  Laterality: N/A;   CARDIOVERSION N/A 05/26/2017   Procedure: CARDIOVERSION;  Surgeon: Chilton Si, MD;  Location: [name] ENDOSCOPY;  Service: Cardiovascular;  Laterality: N/A;   CARDIOVERSION N/A 04/25/2019   Procedure: CARDIOVERSION;  Surgeon: Thurmon Fair, MD;  Location: [facility] ENDOSCOPY;  Service: Cardiovascular;  Laterality: N/A;   CARDIOVERSION N/A 02/26/2022   Procedure: CARDIOVERSION;  Surgeon: Chilton Si, MD;  Location: [facility] ENDOSCOPY;  Service: Cardiovascular;  Laterality: N/A;   CARDIOVERSION N/A 05/13/2022   Procedure: CARDIOVERSION;  Surgeon: Quintella Reichert, MD;  Location: [facility] INVASIVE CV LAB;  Service: Cardiovascular;  Laterality: N/A;   CARPAL TUNNEL RELEASE     CORONARY ANGIOPLASTY     multiple stents   CORONARY ARTERY BYPASS GRAFT  2006   CORONARY STENT INTERVENTION N/A 09/28/2021   Procedure: CORONARY STENT INTERVENTION;  Surgeon: Runell Gess, MD;  Location: [facility] INVASIVE CV LAB;  Service: Cardiovascular;  Laterality: N/A;  SVG-PDA   EYE SURGERY     right eye-  muscular repair   LEFT HEART CATH AND CORS/GRAFTS ANGIOGRAPHY N/A 09/28/2021   Procedure: LEFT HEART CATH AND CORS/GRAFTS  ANGIOGRAPHY;  Surgeon: Runell Gess, MD;  Location: [facility] INVASIVE CV LAB;  Service: Cardiovascular;  Laterality: N/A;   TOTAL HIP ARTHROPLASTY  01/03/2012   Procedure: TOTAL HIP ARTHROPLASTY;  Surgeon: Jacki Cones, MD;  Location: [facility] ORS;  Service: Orthopedics;  Laterality: Right;    Current Outpatient Medications  Medication Sig Dispense Refill   ALPRAZolam (XANAX) 0.5 MG tablet Take 0.5 mg by mouth at bedtime. Takes at 11:00pm     amLODipine (NORVASC) 2.5 MG tablet Take 2.5 mg by mouth daily. Takes at 6:30am     calcium carbonate (OS-CAL) 600 MG TABS tablet Take 600 mg by mouth daily with breakfast.     carvedilol (COREG) 25 MG tablet Take 0.5 tablets (12.5 mg total) by mouth 2 (two) times daily. (Patient taking differently: Take 12.5 mg by mouth 2 (two) times daily. Takes at 6:30am and 5:30pm)     Cholecalciferol (VITAMIN D3 PO) Taking 250 mcg by mouth daily Takes at 6:30am     clopidogrel (PLAVIX) 75 MG tablet Take 1 tablet (75 mg total) by mouth daily with breakfast. Take every day for 1 year (Patient taking differently: Take 75 mg  by mouth daily with breakfast. Takes at 4am) 90 tablet 3   Coenzyme Q10 (CO Q 10 PO) Take 400 mg by mouth daily. Takes at 6:30am     Evolocumab (REPATHA SURECLICK) 140 MG/ML SOAJ INJECT 140 MG INTO THE SKIN EVERY 14 (FOURTEEN) DAYS. INJECT 1 PEN INTO THE FATTY TISSUE SKIN EVERY 14 DAY (Patient taking differently: Inject 140 mg into the skin every 14 (fourteen) days.) 6 mL 3   famotidine (PEPCID) 20 MG tablet Take 1 tablet (20 mg total) by mouth 2 (two) times daily. (Patient taking differently: Take 20 mg by mouth 2 (two) times daily. Takes at 6:30am and 5:30pm)     ferrous sulfate 325 (65 FE) MG EC tablet Take 325 mg by mouth at bedtime. Takes at 5:30pm     furosemide (LASIX) 40 MG tablet Take 1 tablet (40 mg total) by mouth daily as needed. (Patient taking differently: Take 40 mg by mouth daily as needed. ONLY as needed) 30 tablet 3   levothyroxine  (SYNTHROID, LEVOTHROID) 137 MCG tablet Take 137 mcg by mouth daily before breakfast. Takes at 4 am     loratadine (CLARITIN) 10 MG tablet Take 10 mg by mouth daily. Takes at 6:30am     metFORMIN (GLUCOPHAGE) 500 MG tablet Take 500 mg by mouth 2 (two) times daily. Takes at 6:30am and 5:30pm  12   Multiple Vitamin (MULTIVITAMIN WITH MINERALS) TABS tablet Take 1 tablet by mouth daily. Centrum silver men Takes at 6:30am     nitroGLYCERIN (NITROSTAT) 0.4 MG SL tablet Place 1 tablet (0.4 mg total) under the tongue every 5 (five) minutes x 3 doses as needed for chest pain. 25 tablet 12   pantoprazole (PROTONIX) 40 MG tablet Take 40 mg by mouth 2 (two) times daily. Takes at 6:30am and 5:30pm     predniSONE (DELTASONE) 5 MG tablet Take 5 mg by mouth daily with breakfast. Takes at 6:30am     riTUXimab (RITUXAN IV) Inject 1 application  into the vein See admin instructions. Take every two weeks and every six months alternating     testosterone cypionate (DEPOTESTOTERONE CYPIONATE) 100 MG/ML injection Inject 100 mg into the muscle every 14 (fourteen) days.     venlafaxine XR (EFFEXOR-XR) 150 MG 24 hr capsule Take 150 mg by mouth daily. Takes at 6:30am     warfarin (COUMADIN) 5 MG tablet TAKE 1/2 TABLET TO 1 TABLET BY MOUTH DAILY AS DIRECTED BY COUMADIN CLINIC (Patient taking differently: TAKE 1/2 TABLET TO 1 TABLET BY MOUTH DAILY AS DIRECTED BY COUMADIN CLINIC 5mg - Takes on Sunday, Tuesday, Thursday and Saturday 2.5mg - Takes on Monday, Wednesday and Friday) 90 tablet 0   Zinc Chelated 22.5 MG TABS Take 22.5 mg by mouth daily. Takes at 6:30am     No current facility-administered medications for this encounter.    Allergies  Allergen Reactions   Farxiga [Dapagliflozin]     Lightheaded and low blood pressure    Statins Other (See Comments)    Muscle pain    Social History   Socioeconomic History   Marital status: Married    Spouse name: Not on file   Number of children: Not on file   Years of  education: Not on file   Highest education level: Not on file  Occupational History   Not on file  Tobacco Use   Smoking status: Former    Types: Cigarettes    Quit date: 12/24/1983    Years since quitting: 38.4   Smokeless tobacco:  Never  Vaping Use   Vaping Use: Never used  Substance and Sexual Activity   Alcohol use: No    Alcohol/week: 0.0 standard drinks of alcohol   Drug use: No   Sexual activity: Not on file  Other Topics Concern   Not on file  Social History Narrative   Not on file   Social Determinants of Health   Financial Resource Strain: Not on file  Food Insecurity: No Food Insecurity (03/02/2022)   Hunger Vital Sign    Worried About Running Out of Food in the Last Year: Never true    Ran Out of Food in the Last Year: Never true  Transportation Needs: No Transportation Needs (03/09/2022)   PRAPARE - Administrator, Civil Service (Medical): No    Lack of Transportation (Non-Medical): No  Physical Activity: Not on file  Stress: Not on file  Social Connections: Not on file  Intimate Partner Violence: Not At Risk (03/02/2022)   Humiliation, Afraid, Rape, and Kick questionnaire    Fear of Current or Ex-Partner: No    Emotionally Abused: No    Physically Abused: No    Sexually Abused: No     ROS- All systems are reviewed and negative except as per the HPI above.  Physical Exam: Vitals:   06/07/22 1042  BP: (!) 144/80  Pulse: (!) 57  Weight: 104 kg  Height: 5\' 10"  (1.778 m)    GEN- The patient is well appearing, alert and oriented x 3 today.   Head- normocephalic, atraumatic Eyes-  Sclera clear, conjunctiva pink Ears- hearing intact Lungs- Clear to ausculation bilaterally, normal work of breathing Heart- Regular rate and rhythm, no murmurs, rubs or gallops, PMI not laterally displaced Extremities- no clubbing, cyanosis, or edema MS- no significant deformity or atrophy Skin- no rash or lesion Psych- euthymic mood, full affect Neuro-  strength and sensation are intact   Wt Readings from Last 3 Encounters:  06/07/22 104 kg  05/24/22 105.8 kg  05/13/22 104.3 kg    EKG today demonstrates  Vent. rate 57 BPM PR interval 176 ms QRS duration 102 ms QT/QTcB 454/441 ms P-R-T axes 60 -39 17 Sinus bradycardia Left axis deviation Left ventricular hypertrophy with repolarization abnormality ( Cornell product ) Abnormal ECG When compared with ECG of 24-May-2022 14:07, PREVIOUS ECG IS PRESENT  Echo 03/02/22 demonstrated    1. Left ventricular ejection fraction, by estimation, is 50%. The left  ventricle has mildly decreased function. The left ventricle demonstrates  global hypokinesis. There is moderate concentric left ventricular  hypertrophy. Left ventricular diastolic  parameters are consistent with Grade II diastolic dysfunction  (pseudonormalization). Elevated left atrial pressure.   2. Right ventricular systolic function is mildly reduced. The right  ventricular size is normal. There is mildly elevated pulmonary artery  systolic pressure. The estimated right ventricular systolic pressure is  35.5 mmHg.   3. Left atrial size was severely dilated.   4. The mitral valve is normal in structure. Trivial mitral valve  regurgitation. No evidence of mitral stenosis. Moderate mitral annular  calcification.   5. The aortic valve is tricuspid. There is mild calcification of the  aortic valve. There is mild thickening of the aortic valve. Aortic valve  regurgitation is not visualized. Aortic valve sclerosis/calcification is  present, without any evidence of  aortic stenosis.   6. The inferior vena cava is normal in size with greater than 50%  respiratory variability, suggesting right atrial pressure of 3 mmHg.   Comparison(s):  No significant change from prior study. Prior images  reviewed side by side.   Heart cath 09/28/21:    Prox LAD to Mid LAD lesion is 100% stenosed.   Ost LAD to Prox LAD lesion is 100%  stenosed.   Ramus lesion is 100% stenosed.   Prox RCA lesion is 100% stenosed.   Prox RCA to Dist RCA lesion is 100% stenosed.   Ost Cx to Prox Cx lesion is 30% stenosed.   Dist Graft to Insertion lesion is 99% stenosed.   A drug-eluting stent was successfully placed using a SYNERGY XD [id number].   Post intervention, there is a 0% residual stenosis.  Epic records are reviewed at length today  CHA2DS2-VASc Score = 6  The patient's score is based upon: CHF History: 1 HTN History: 1 Diabetes History: 1 Stroke History: 0 Vascular Disease History: 1 Age Score: 2 Gender Score: 0       ASSESSMENT AND PLAN: Persistent Atrial Fibrillation (ICD10:  I48.0) The patient's CHA2DS2-VASc score is 6, indicating a 9.7% annual risk of stroke.    He is in NSR today. S/p DCCV on 5/9 with successful conversion.  Patient would like to pursue dofetilide and presents for dofetilide admission today. Continue coumadin as directed, states 4 therapeutic INRs in last 4 weeks. No recent benadryl use PharmD has screened medications. QTc in SR 428 ms Labs today show creatinine at 0.91, K+ 3.8 and mag 2.1, CrCl calculated at 97 mL/min  He is eligible for 500 mcg BID dosage.     2. Secondary Hypercoagulable State (ICD10:  D68.69) The patient is at significant risk for stroke/thromboembolism based upon his CHA2DS2-VASc Score of 6.  Continue Warfarin (Coumadin).   Continue coumadin and INR checks as directed.   He will present today for Tikosyn admission once bed is ready.  [name], PA-C Afib Clinic [facility] [address] [place], [place] [address] [phone]

## 2022-06-07 NOTE — Progress Notes (Signed)
Pharmacy: Dofetilide (Tikosyn) - Initial Consult Assessment and Electrolyte Replacement  Pharmacy consulted to assist in monitoring and replacing electrolytes in this 77 y.o. male admitted on 06/07/2022 undergoing dofetilide initiation.   Assessment:  Patient Exclusion Criteria: If any screening criteria checked as "Yes", then  patient  should NOT receive dofetilide until criteria item is corrected.  If "Yes" please indicate correction plan.  YES  NO Patient  Exclusion Criteria Correction Plan   []   [x]   Baseline QTc interval is greater than or equal to 440 msec. IF above YES box checked dofetilide contraindicated unless patient has ICD; then may proceed if QTc 500-550 msec or with known ventricular conduction abnormalities may proceed with QTc 550-600 msec. QTc = 428 (per clinic note)    []   [x]   Patient is known or suspected to have a digoxin level greater than 2 ng/ml: No results found for: "DIGOXIN"     []   [x]   Creatinine clearance less than 20 ml/min (calculated using Cockcroft-Gault, actual body weight and serum creatinine): Estimated Creatinine Clearance: 79.5 mL/min (by C-G formula based on SCr of 0.91 mg/dL).     [x]   [x]  Patient has received drugs known to prolong the QT intervals within the last 48 hours (phenothiazines, tricyclics or tetracyclic antidepressants, erythromycin, H-1 antihistamines, cisapride, fluoroquinolones, azithromycin, ondansetron).   Updated information on QT prolonging agents is available to be searched on the following database:QT prolonging agents  Venlafaxine has a risk of QTc prolongation but is not contraindicated   []   [x]   Patient received a dose of hydrochlorothiazide (Oretic) alone or in any combination including triamterene (Dyazide, Maxzide) in the last 48 hours.    []   [x]  Patient received a medication known to increase dofetilide plasma concentrations prior to initial dofetilide dose:  Trimethoprim (Primsol, Proloprim) in the last  36 hours Verapamil (Calan, Verelan) in the last 36 hours or a sustained release dose in the last 72 hours Megestrol (Megace) in the last 5 days  Cimetidine (Tagamet) in the last 6 hours Ketoconazole (Nizoral) in the last 24 hours Itraconazole (Sporanox) in the last 48 hours  Prochlorperazine (Compazine) in the last 36 hours     []   [x]   Patient is known to have a history of torsades de pointes; congenital or acquired long QT syndromes.    []   [x]   Patient has received a Class 1 antiarrhythmic with less than 2 half-lives since last dose. (Disopyramide, Quinidine, Procainamide, Lidocaine, Mexiletine, Flecainide, Propafenone)    []   [x]   Patient has received amiodarone therapy in the past 3 months or amiodarone level is greater than 0.3 ng/ml.    Labs:    Component Value Date/Time   K 3.8 06/07/2022 1133   MG 2.1 06/07/2022 1133     Plan: Select One Calculated CrCl  Dose q12h  [x]  > 60 ml/min 500 mcg  []  40-60 ml/min 250 mcg  []  20-40 ml/min 125 mcg   [x]   Physician selected initial dose within range recommended for patients level of renal function - will monitor for response.  []   Physician selected initial dose outside of range recommended for patients level of renal function - will discuss if the dose should be altered at this time.   Patient has been appropriately anticoagulated with warfarin.  Potassium: K 3.8-3.9:  Hold Tikosyn initiation and give KCl 40 mEq po x1 then begin Tikosyn at least 2hr after KCl dose - do not need to recheck K   Magnesium: Mg >2: Appropriate to initiate  Tikosyn, no replacement needed     Thank you for allowing pharmacy to participate in this patient's care   Harland German, PharmD Clinical Pharmacist **Pharmacist phone directory can now be found on [email or web address] ([email or web address]).  Listed under [facility].

## 2022-06-07 NOTE — H&P (Signed)
Electrophysiology H&P  Note    Primary Care Physician: Charlane Ferretti, DO Primary Cardiologist: Dr. Duke Salvia Primary Electrophysiologist: N/A Referring Physician: Dr. Alinda Deem Gary [name] is a 77 y.o. male with a history of hypertension, melanoma, CAD s/p PCI and CABG, mild ascending aortic aneurysm, chronic systolic and diastolic heart failure, hyperlipidemia, paroxysmal atrial fibrillation, and OSA non-compliant with CPAP who presents for consultation in the [facility]. Patient is on coumadin for a CHADS2VASC score of 6.  He underwent scheduled cardioversion on 02/26/22, for afib that was found in January. CV was successful in converting to sinus rhythm on the second shock .Unfortunately, he was admitted from 2/26-3/2 for aspiration pneumonia possibly secondary to  intubation/ DCCV as pt was on Ozempic, which was not stopped and he did not want to delay CV. He appeared to have been in sinus rhythm during admission. He is currently on carvedilol and warfarin. No bleeding concerns. He does state was on eliquis a couple years ago but switched to coumadin due to cost while in the donut hole. He is in rate controlled afib today and was not aware.   He cannot specifically tell me when he went back into Afib since hospital discharge. He admits to overall feeling low energy but it is not new and has felt this way for a while. He definitely feels better than he did 2 weeks ago. Has had Afib for about 4 years intermittently; required cardioversion about a year or so ago. Denies shortness of breath or palpitations. He is now compliant with his CPAP.   On follow up 05/11/22, he is currently in rate controlled Afib. He is here today for Tikosyn admission after plan confirmed at OV with Dr. Elberta Fortis on 04/12/22. Medication list reviewed by pharmacy and no contraindications noted. He has completed 4 therapeutic INR checks being on coumadin. He has not taken benadryl in the last week.  He does note some bilateral ankle swelling since stopping aldactone.  On follow up 05/24/22, he is currently in NSR. S/p successful DCCV on 5/9 to NSR. He was originally scheduled for Tikosyn admission but this was canceled unfortunately due to being on bactrim. This medication was added to his list after pharmacy review. Seen by rheumatologist Dr. Dierdre Forth on 5/15 and bactrim was discontinued; placed on prednisone 5 mg daily. He has continued INR checks as directed and none have been below 2.0; most recent INR on 5/17 was 3.4.   On follow up 06/07/22, he is currently in NSR. He is here today for Tikosyn admission. He is currently on prednisone daily instead of bactrim. He has completed 4 therapeutic INR checks. He has not begun any new medications since last office visit. He has taken all his morning medications as directed.  Today, he denies symptoms of palpitations, chest pain, shortness of breath, orthopnea, PND, lower extremity edema, dizziness, presyncope, syncope, snoring, daytime somnolence, bleeding, or neurologic sequela. The patient is tolerating medications without difficulties and is otherwise without complaint today.    Atrial Fibrillation Risk Factors:  he does have symptoms or diagnosis of sleep apnea. he is not compliant with CPAP therapy. he does not have a history of rheumatic fever. he does not have a history of alcohol use. The patient does not have a history of early familial atrial fibrillation or other arrhythmias.  he has a BMI of There is no height or weight on file to calculate BMI.. There were no vitals filed for this visit.  Family History  Problem Relation Age of Onset   Heart attack Mother    Other Father        odd stomach disorder   Stroke Maternal Grandmother    Heart attack Maternal Grandfather    Heart attack Paternal Grandmother    Heart attack Paternal Grandfather      Atrial Fibrillation Management history:  Previous antiarrhythmic drugs:  None Previous cardioversions: 02/26/22 Previous ablations: None CHADS2VASC score: 6 Anticoagulation history: Coumadin   Past Medical History:  Diagnosis Date   Aortic aneurysm (HCC) 02/20/2016   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   Arthritis    Atrial fibrillation (HCC) 11/13/2014   Cancer (HCC)    skin   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/2016   LVEF 45-50% 11/2014   Coronary artery disease    Depression    Essential hypertension 11/13/2014   Hyperlipidemia    Hyperlipidemia 11/13/2014   Hypertension    Sleep apnea    CPAP 'Uses half the time"  last study 2009   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   Past Surgical History:  Procedure Laterality Date   CARDIOVERSION N/A 12/06/2014   Procedure: CARDIOVERSION;  Surgeon: Chilton Si, MD;  Location: [facility] ENDOSCOPY;  Service: Cardiovascular;  Laterality: N/A;   CARDIOVERSION N/A 05/26/2017   Procedure: CARDIOVERSION;  Surgeon: Chilton Si, MD;  Location: [facility] ENDOSCOPY;  Service: Cardiovascular;  Laterality: N/A;   CARDIOVERSION N/A 04/25/2019   Procedure: CARDIOVERSION;  Surgeon: Thurmon Fair, MD;  Location: [facility] ENDOSCOPY;  Service: Cardiovascular;  Laterality: N/A;   CARDIOVERSION N/A 02/26/2022   Procedure: CARDIOVERSION;  Surgeon: Chilton Si, MD;  Location: [facility] ENDOSCOPY;  Service: Cardiovascular;  Laterality: N/A;   CARDIOVERSION N/A 05/13/2022   Procedure: CARDIOVERSION;  Surgeon: Quintella Reichert, MD;  Location: [facility] INVASIVE CV LAB;  Service: Cardiovascular;  Laterality: N/A;   CARPAL TUNNEL RELEASE     CORONARY ANGIOPLASTY     multiple stents   CORONARY ARTERY BYPASS GRAFT  2006   CORONARY STENT INTERVENTION N/A 09/28/2021   Procedure: CORONARY STENT INTERVENTION;  Surgeon: Runell Gess, MD;  Location: [facility] INVASIVE CV LAB;  Service: Cardiovascular;  Laterality: N/A;  SVG-PDA   EYE SURGERY     right eye-  muscular repair   LEFT HEART CATH AND CORS/GRAFTS ANGIOGRAPHY N/A 09/28/2021    Procedure: LEFT HEART CATH AND CORS/GRAFTS ANGIOGRAPHY;  Surgeon: Runell Gess, MD;  Location: [facility] INVASIVE CV LAB;  Service: Cardiovascular;  Laterality: N/A;   TOTAL HIP ARTHROPLASTY  01/03/2012   Procedure: TOTAL HIP ARTHROPLASTY;  Surgeon: Jacki Cones, MD;  Location: [facility] ORS;  Service: Orthopedics;  Laterality: Right;    Current Facility-Administered Medications  Medication Dose Route Frequency Provider Last Rate Last Admin   0.9 %  sodium chloride infusion  250 mL Intravenous PRN Eustace Pen, PA-C       [START ON 06/08/2022] amLODipine (NORVASC) tablet 2.5 mg  2.5 mg Oral Daily Eustace Pen, PA-C       carvedilol (COREG) tablet 12.5 mg  12.5 mg Oral BID Eustace Pen, PA-C       [START ON 06/08/2022] clopidogrel (PLAVIX) tablet 75 mg  75 mg Oral Q breakfast Eustace Pen, PA-C       dofetilide [facility]) capsule 500 mcg  500 mcg Oral BID Eustace Pen, PA-C       famotidine (PEPCID) tablet 20 mg  20 mg Oral BID Ermelinda Das       [  START ON 06/09/2022] levothyroxine (SYNTHROID) tablet 137 mcg  137 mcg Oral QAC breakfast Eustace Pen, PA-C       [START ON 06/08/2022] loratadine (CLARITIN) tablet 10 mg  10 mg Oral Daily Eustace Pen, PA-C       metFORMIN (GLUCOPHAGE) tablet 500 mg  500 mg Oral BID Eustace Pen, PA-C       pantoprazole (PROTONIX) EC tablet 40 mg  40 mg Oral BID Eustace Pen, PA-C       [START ON 06/08/2022] predniSONE (DELTASONE) tablet 5 mg  5 mg Oral Q breakfast Eustace Pen, PA-C       sodium chloride flush (NS) 0.9 % injection 3 mL  3 mL Intravenous Q12H Eustace Pen, PA-C       sodium chloride flush (NS) 0.9 % injection 3 mL  3 mL Intravenous PRN Eustace Pen, PA-C       [START ON 06/08/2022] venlafaxine XR (EFFEXOR-XR) 24 hr capsule 150 mg  150 mg Oral Daily Eustace Pen, PA-C        Allergies  Allergen Reactions   Marcelline Deist [Dapagliflozin]     Lightheaded and low blood pressure    Statins Other (See Comments)     Muscle pain    Social History   Socioeconomic History   Marital status: Married    Spouse name: Not on file   Number of children: Not on file   Years of education: Not on file   Highest education level: Not on file  Occupational History   Not on file  Tobacco Use   Smoking status: Former    Types: Cigarettes    Quit date: 12/24/1983    Years since quitting: 38.4   Smokeless tobacco: Never  Vaping Use   Vaping Use: Never used  Substance and Sexual Activity   Alcohol use: No    Alcohol/week: 0.0 standard drinks of alcohol   Drug use: No   Sexual activity: Not on file  Other Topics Concern   Not on file  Social History Narrative   Not on file   Social Determinants of Health   Financial Resource Strain: Not on file  Food Insecurity: No Food Insecurity (03/02/2022)   Hunger Vital Sign    Worried About Running Out of Food in the Last Year: Never true    Ran Out of Food in the Last Year: Never true  Transportation Needs: No Transportation Needs (03/09/2022)   PRAPARE - Transportation    Lack of Transportation (Medical): No    Lack of Transportation (Non-Medical): No  Physical Activity: Not on file  Stress: Not on file  Social Connections: Not on file  Intimate Partner Violence: Not At Risk (03/02/2022)   Humiliation, Afraid, Rape, and Kick questionnaire    Fear of Current or Ex-Partner: No    Emotionally Abused: No    Physically Abused: No    Sexually Abused: No     ROS- All systems are reviewed and negative except as per the HPI above.  Physical Exam: Vitals:   06/07/22 1341  BP: (!) 161/89  Pulse: 63  Resp: 18  Temp: 97.6 F (36.4 C)  TempSrc: Oral  SpO2: 97%    GEN- The patient is well appearing, alert and oriented x 3 today.   Head- normocephalic, atraumatic Eyes-  Sclera clear, conjunctiva pink Ears- hearing intact Lungs- Clear to ausculation bilaterally, normal work of breathing Heart- Regular rate and rhythm, no murmurs, rubs or gallops, PMI  not  laterally displaced Extremities- no clubbing, cyanosis, or edema MS- no significant deformity or atrophy Skin- no rash or lesion Psych- euthymic mood, full affect Neuro- strength and sensation are intact   Wt Readings from Last 3 Encounters:  06/07/22 104 kg  05/24/22 105.8 kg  05/13/22 104.3 kg    EKG today demonstrates  Vent. rate 57 BPM PR interval 176 ms QRS duration 102 ms QT/QTcB 454/441 ms P-R-T axes 60 -39 17 Sinus bradycardia Left axis deviation Left ventricular hypertrophy with repolarization abnormality ( Cornell product ) Abnormal ECG When compared with ECG of 24-May-2022 14:07, PREVIOUS ECG IS PRESENT  Echo 03/02/22 demonstrated    1. Left ventricular ejection fraction, by estimation, is 50%. The left  ventricle has mildly decreased function. The left ventricle demonstrates  global hypokinesis. There is moderate concentric left ventricular  hypertrophy. Left ventricular diastolic  parameters are consistent with Grade II diastolic dysfunction  (pseudonormalization). Elevated left atrial pressure.   2. Right ventricular systolic function is mildly reduced. The right  ventricular size is normal. There is mildly elevated pulmonary artery  systolic pressure. The estimated right ventricular systolic pressure is  35.5 mmHg.   3. Left atrial size was severely dilated.   4. The mitral valve is normal in structure. Trivial mitral valve  regurgitation. No evidence of mitral stenosis. Moderate mitral annular  calcification.   5. The aortic valve is tricuspid. There is mild calcification of the  aortic valve. There is mild thickening of the aortic valve. Aortic valve  regurgitation is not visualized. Aortic valve sclerosis/calcification is  present, without any evidence of  aortic stenosis.   6. The inferior vena cava is normal in size with greater than 50%  respiratory variability, suggesting right atrial pressure of 3 mmHg.   Comparison(s): No significant  change from prior study. Prior images  reviewed side by side.   Heart cath 09/28/21:    Prox LAD to Mid LAD lesion is 100% stenosed.   Ost LAD to Prox LAD lesion is 100% stenosed.   Ramus lesion is 100% stenosed.   Prox RCA lesion is 100% stenosed.   Prox RCA to Dist RCA lesion is 100% stenosed.   Ost Cx to Prox Cx lesion is 30% stenosed.   Dist Graft to Insertion lesion is 99% stenosed.   A drug-eluting stent was successfully placed using a SYNERGY XD [id number].   Post intervention, there is a 0% residual stenosis.  Epic records are reviewed at length today  CHA2DS2-VASc Score = 6  The patient's score is based upon: CHF History: 1 HTN History: 1 Diabetes History: 1 Stroke History: 0 Vascular Disease History: 1 Age Score: 2 Gender Score: 0       ASSESSMENT AND PLAN: Persistent Atrial Fibrillation (ICD10:  I48.0) The patient's CHA2DS2-VASc score is 6, indicating a 9.7% annual risk of stroke.    He is in NSR today. S/p DCCV on 5/9 with successful conversion.  Patient would like to pursue dofetilide and presents for dofetilide admission today. Continue coumadin as directed, states 4 therapeutic INRs in last 4 weeks. No recent benadryl use PharmD has screened medications. QTc in SR 428 ms Labs today show creatinine at 0.91, K+ 3.8 and mag 2.1, CrCl calculated at 97 mL/min  He is eligible for 500 mcg BID dosage.     2. Secondary Hypercoagulable State (ICD10:  D68.69) The patient is at significant risk for stroke/thromboembolism based upon his CHA2DS2-VASc Score of 6.  Continue Warfarin (Coumadin).   Continue coumadin and  INR checks as directed.  Pt presents for planned Tikosyn admission without change to the above.  Casimiro Needle [address]" [place], PA-C  06/07/2022 2:28 PM

## 2022-06-07 NOTE — TOC CM/SW Note (Signed)
Transition of Care [facility]) - Inpatient Brief Assessment   Patient Details  Name: Gary [name] MRN: [id number] Date of Birth: 06-27-1945  Transition of Care [facility]) CM/SW Contact:    Gala Lewandowsky, RN Phone Number: 06/07/2022, 2:22 PM   Clinical Narrative: Transition of Care Department [facility]) has reviewed the patient. Patient presented for Tikosyn Load. Benefits check submitted for cost. Case Manager will discuss cost and pharmacy of choice as the patient progresses.   Transition of Care Asessment: Insurance and Status: Insurance coverage has been reviewed Patient has primary care physician: Yes     Prior/Current Home Services: No current home services   Readmission risk has been reviewed: Yes Transition of care needs: no transition of care needs at this time

## 2022-06-07 NOTE — Progress Notes (Addendum)
ANTICOAGULATION CONSULT NOTE - Initial Consult  Pharmacy Consult for warfarin Indication: atrial fibrillation  Allergies  Allergen Reactions   Farxiga [Dapagliflozin]     Lightheaded and low blood pressure    Statins Other (See Comments)    Muscle pain    Vital Signs: Temp: 97.6 F (36.4 C) (06/03 1341) Temp Source: Oral (06/03 1341) BP: 161/89 (06/03 1341) Pulse Rate: 63 (06/03 1341)  Labs: Recent Labs    06/07/22 0850 06/07/22 1133  INR 2.4  --   CREATININE  --  0.91    Estimated Creatinine Clearance: 79.5 mL/min (by C-G formula based on SCr of 0.91 mg/dL).   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 Takes at 11:00pm      amLODipine (NORVASC) 2.5 MG tablet Take 2.5 mg by mouth daily. Takes at 6:30am      calcium carbonate (OS-CAL) 600 MG TABS tablet Take 600 mg by mouth daily with breakfast.      carvedilol (COREG) 25 MG tablet Take 0.5 tablets (12.5 mg total) by mouth 2 (two) times daily. (Patient taking differently: Take 12.5 mg by mouth 2 (two) times daily. Takes at 6:30am and 5:30pm)      Cholecalciferol (VITAMIN D3 PO) Taking 250 mcg by mouth daily Takes at 6:30am      clopidogrel (PLAVIX) 75 MG tablet Take 1 tablet (75 mg total) by mouth daily with breakfast. Take every day for 1 year (Patient taking  differently: Take 75 mg by mouth daily with breakfast. Takes at 4am) 90 tablet 3    Coenzyme Q10 (CO Q 10 PO) Take 400 mg by mouth daily. Takes at 6:30am      Evolocumab (REPATHA SURECLICK) 140 MG/ML SOAJ INJECT 140 MG INTO THE SKIN EVERY 14 (FOURTEEN) DAYS. INJECT 1 PEN INTO THE FATTY TISSUE SKIN EVERY 14 DAY (Patient taking differently: Inject 140 mg into the skin every 14 (fourteen) days.) 6 mL 3    famotidine (PEPCID) 20 MG tablet Take 1 tablet (20 mg total) by mouth 2 (two) times daily. (Patient taking differently: Take 20 mg by mouth 2 (two) times daily. Takes at 6:30am and 5:30pm)      ferrous sulfate 325 (65 FE) MG EC tablet Take 325 mg by mouth at bedtime. Takes at 5:30pm      furosemide (LASIX) 40 MG tablet Take 1 tablet (40 mg total) by mouth daily as needed. (Patient taking differently: Take 40 mg by mouth daily as needed. ONLY as needed) 30 tablet 3    levothyroxine (SYNTHROID, LEVOTHROID) 137 MCG tablet Take 137 mcg by mouth daily before breakfast. Takes at 4 am      loratadine (CLARITIN) 10 MG tablet Take 10 mg by mouth daily. Takes at 6:30am      metFORMIN (GLUCOPHAGE)  500 MG tablet Take 500 mg by mouth 2 (two) times daily. Takes at 6:30am and 5:30pm  12    Multiple Vitamin (MULTIVITAMIN WITH MINERALS) TABS tablet Take 1 tablet by mouth daily. Centrum silver men Takes at 6:30am      nitroGLYCERIN (NITROSTAT) 0.4 MG SL tablet Place 1 tablet (0.4 mg total) under the tongue every 5 (five) minutes x 3 doses as needed for chest pain. 25 tablet 12    pantoprazole (PROTONIX) 40 MG tablet Take 40 mg by mouth 2 (two) times daily. Takes at 6:30am and 5:30pm      predniSONE (DELTASONE) 5 MG tablet Take 5 mg by mouth daily with breakfast. Takes at 6:30am      riTUXimab (RITUXAN IV) Inject 1 application  into the vein See admin instructions. Take every two weeks and every six months alternating      testosterone cypionate (DEPOTESTOTERONE CYPIONATE) 100 MG/ML injection Inject 100 mg into the muscle  every 14 (fourteen) days.      venlafaxine XR (EFFEXOR-XR) 150 MG 24 hr capsule Take 150 mg by mouth daily. Takes at 6:30am      warfarin (COUMADIN) 5 MG tablet TAKE 1/2 TABLET TO 1 TABLET BY MOUTH DAILY AS DIRECTED BY COUMADIN CLINIC (Patient taking differently: TAKE 1/2 TABLET TO 1 TABLET BY MOUTH DAILY AS DIRECTED BY COUMADIN CLINIC 5mg - Takes on Sunday, Tuesday, Thursday and Saturday 2.5mg - Takes on Monday, Wednesday and Friday) 90 tablet 0    Zinc Chelated 22.5 MG TABS Take 22.5 mg by mouth daily. Takes at 6:30am       Assessment: 77 yo male here for Tikosyn initiation. He is on warfarin PTA for afib and pharmacy consulted to dose -INR= 2.3 (in clinic 6/3) -last warfarin dose taking 6/3 in the morning  Home warfarin dose: 5mg /d and 2.5mg  Mon; INR= 2.4 on 6/3   Goal of Therapy:  INR 2-3 Monitor platelets by anticoagulation protocol: Yes   Plan:  -Continue warfarin at his home dose (next dose will be 06/08/22) -Daily INR  Harland German, PharmD Clinical Pharmacist **Pharmacist phone directory can now be found on [email or web address] ([email or web address]).  Listed under [facility].

## 2022-06-08 DIAGNOSIS — I4819 Other persistent atrial fibrillation: Secondary | ICD-10-CM | POA: Diagnosis not present

## 2022-06-08 LAB — PROTIME-INR
INR: 2 — ABNORMAL HIGH (ref 0.8–1.2)
Prothrombin Time: 22.8 seconds — ABNORMAL HIGH (ref 11.4–15.2)

## 2022-06-08 LAB — BASIC METABOLIC PANEL
Anion gap: 9 (ref 5–15)
BUN: 21 mg/dL (ref 8–23)
CO2: 28 mmol/L (ref 22–32)
Calcium: 9.2 mg/dL (ref 8.9–10.3)
Chloride: 104 mmol/L (ref 98–111)
Creatinine, Ser: 0.96 mg/dL (ref 0.61–1.24)
GFR, Estimated: >60 mL/min (ref >60)
Glucose, Bld: 105 mg/dL — ABNORMAL HIGH (ref 70–99)
Potassium: 3.8 mmol/L (ref 3.5–5.1)
Sodium: 141 mmol/L (ref 135–145)

## 2022-06-08 LAB — MAGNESIUM: Magnesium: 2.1 mg/dL (ref 1.7–2.4)

## 2022-06-08 MED ORDER — POTASSIUM CHLORIDE CRYS ER 20 MEQ PO TBCR
20.0000 meq | EXTENDED_RELEASE_TABLET | Freq: Once | ORAL | Status: AC
  Administered 2022-06-08: 20 meq via ORAL
  Filled 2022-06-08: qty 1

## 2022-06-08 NOTE — Progress Notes (Signed)
Morning EKG reviewed     Shows remains in NSR at 55 bpm with stable QTc at ~470 ms.  Continue  Tikosyn 500 mcg BID.   Potassium3.8 (06/04 0509) Magnesium  2.1 (06/04 0509) Creatinine, ser  0.96 (06/04 0509)  Plan for home Thursday if QTc remains stable   Graciella Freer, [place]  06/08/2022 12:44 PM

## 2022-06-08 NOTE — Consult Note (Signed)
   [facility] Inpatient Consult   06/08/2022  Gary [name] [birth year] [id number]  [facility] [[facility]]  Accountable Care Organization [ACO] Patient:  Medicare ACO REACH  Primary Care Provider: Charlane Ferretti, DO with [facility]   Patient is currently active with [facility] [[facility]] Care Management for chronic disease management services.  Patient has been engaged by a [name] RN CC.  Our community based plan of care has focused on disease management and community resource support.    Came by patient room on rounds and patient is currently not in room.  Left an appointment reminder card and a 24 hour nurse advise line magnet on the overbed table.  Plan: Follow up with Inpatient Transition Of Care [TOC] team member to make aware that [facility] Care Management following.   Of note, [facility] Care Management services does not replace or interfere with any services that are needed or arranged by inpatient [facility] care management team.   For additional questions or referrals please contact:  Charlesetta Shanks, RN BSN CCM [facility]  [phone] business mobile phone Toll free office [phone]  *Concierge Line  [phone] Fax number: [phone] [email or web address]

## 2022-06-08 NOTE — Progress Notes (Signed)
ANTICOAGULATION CONSULT NOTE   Pharmacy Consult for warfarin Indication: atrial fibrillation  Allergies  Allergen Reactions   Farxiga [Dapagliflozin]     Lightheaded and low blood pressure    Statins Other (See Comments)    Muscle pain    Vital Signs: Temp: 98.6 F (37 C) (06/04 0458) Temp Source: Oral (06/04 0458) BP: 142/80 (06/04 0458) Pulse Rate: 55 (06/04 0458)  Labs: Recent Labs    06/07/22 0850 06/07/22 1133 06/08/22 0509  LABPROT  --   --  22.8*  INR 2.4  --  2.0*  CREATININE  --  0.91 0.96     Estimated Creatinine Clearance: 75 mL/min (by C-G formula based on SCr of 0.96 mg/dL).   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 Takes at 11:00pm      amLODipine (NORVASC) 2.5 MG tablet Take 2.5 mg by mouth daily. Takes at 6:30am      calcium carbonate (OS-CAL) 600 MG TABS tablet Take 600 mg by mouth daily with breakfast.      carvedilol (COREG) 25 MG tablet Take 0.5 tablets (12.5 mg total) by mouth 2 (two) times daily. (Patient taking differently: Take 12.5 mg by mouth 2 (two) times daily. Takes at 6:30am and 5:30pm)      Cholecalciferol (VITAMIN D3 PO) Taking 250 mcg by mouth daily Takes at 6:30am      clopidogrel (PLAVIX) 75 MG tablet Take 1 tablet (75 mg total) by mouth daily with breakfast. Take every day for  1 year (Patient taking differently: Take 75 mg by mouth daily with breakfast. Takes at 4am) 90 tablet 3    Coenzyme Q10 (CO Q 10 PO) Take 400 mg by mouth daily. Takes at 6:30am      Evolocumab (REPATHA SURECLICK) 140 MG/ML SOAJ INJECT 140 MG INTO THE SKIN EVERY 14 (FOURTEEN) DAYS. INJECT 1 PEN INTO THE FATTY TISSUE SKIN EVERY 14 DAY (Patient taking differently: Inject 140 mg into the skin every 14 (fourteen) days.) 6 mL 3    famotidine (PEPCID) 20 MG tablet Take 1 tablet (20 mg total) by mouth 2 (two) times daily. (Patient taking differently: Take 20 mg by mouth 2 (two) times daily. Takes at 6:30am and 5:30pm)      ferrous sulfate 325 (65 FE) MG EC tablet Take 325 mg by mouth at bedtime. Takes at 5:30pm      furosemide (LASIX) 40 MG tablet Take 1 tablet (40 mg total) by mouth daily as needed. (Patient taking differently: Take 40 mg by mouth daily as needed. ONLY as needed) 30 tablet 3    levothyroxine (SYNTHROID, LEVOTHROID) 137 MCG tablet Take 137 mcg by mouth daily before breakfast. Takes at 4 am      loratadine (CLARITIN) 10 MG tablet Take 10 mg by  mouth daily. Takes at 6:30am      metFORMIN (GLUCOPHAGE) 500 MG tablet Take 500 mg by mouth 2 (two) times daily. Takes at 6:30am and 5:30pm  12    Multiple Vitamin (MULTIVITAMIN WITH MINERALS) TABS tablet Take 1 tablet by mouth daily. Centrum silver men Takes at 6:30am      nitroGLYCERIN (NITROSTAT) 0.4 MG SL tablet Place 1 tablet (0.4 mg total) under the tongue every 5 (five) minutes x 3 doses as needed for chest pain. 25 tablet 12    pantoprazole (PROTONIX) 40 MG tablet Take 40 mg by mouth 2 (two) times daily. Takes at 6:30am and 5:30pm      predniSONE (DELTASONE) 5 MG tablet Take 5 mg by mouth daily with breakfast. Takes at 6:30am      riTUXimab (RITUXAN IV) Inject 1 application  into the vein See admin instructions. Take every two weeks and every six months alternating      testosterone cypionate (DEPOTESTOTERONE CYPIONATE) 100 MG/ML injection Inject  100 mg into the muscle every 14 (fourteen) days.      venlafaxine XR (EFFEXOR-XR) 150 MG 24 hr capsule Take 150 mg by mouth daily. Takes at 6:30am      warfarin (COUMADIN) 5 MG tablet TAKE 1/2 TABLET TO 1 TABLET BY MOUTH DAILY AS DIRECTED BY COUMADIN CLINIC (Patient taking differently: TAKE 1/2 TABLET TO 1 TABLET BY MOUTH DAILY AS DIRECTED BY COUMADIN CLINIC 5mg - Takes on Sunday, Tuesday, Thursday and Saturday 2.5mg - Takes on Monday, Wednesday and Friday) 90 tablet 0    Zinc Chelated 22.5 MG TABS Take 22.5 mg by mouth daily. Takes at 6:30am       Assessment: 77 yo male here for Tikosyn initiation. He is on warfarin PTA for afib and pharmacy consulted to dose -INR= 2.0  Home warfarin dose: 5mg /d and 2.5mg  Mon; INR= 2.4 on 6/3   Goal of Therapy:  INR 2-3 Monitor platelets by anticoagulation protocol: Yes   Plan:  -Continue warfarin at his home dose  -Daily INR  Harland German, PharmD Clinical Pharmacist **Pharmacist phone directory can now be found on [email or web address] ([email or web address]).  Listed under [facility].

## 2022-06-08 NOTE — Progress Notes (Signed)
Pharmacy: Dofetilide (Tikosyn) - Follow Up Assessment and Electrolyte Replacement  Pharmacy consulted to assist in monitoring and replacing electrolytes in this 77 y.o. male admitted on 06/07/2022 undergoing dofetilide initiation.   Labs:    Component Value Date/Time   K 3.8 06/08/2022 0509   MG 2.1 06/08/2022 0509     Plan: Potassium: -Kdur ordered  Magnesium: Mg > 2: No additional supplementation needed   Thank you for allowing pharmacy to participate in this patient's care   Harland German, PharmD Clinical Pharmacist **Pharmacist phone directory can now be found on [email or web address] ([email or web address]).  Listed under [facility].

## 2022-06-08 NOTE — Progress Notes (Addendum)
Electrophysiology Rounding Note  Patient Name: Gary [name] Date of Encounter: 06/08/2022  Primary Cardiologist: Chilton Si, MD  Electrophysiologist: None    Subjective   Pt remains in NSR on Tikosyn 500 mcg BID   QTc from EKG last pm shows stable QTc at ~470 ms  The patient is doing well today.  At this time, the patient denies chest pain, shortness of breath, or any new concerns.  Inpatient Medications    Scheduled Meds:  amLODipine  2.5 mg Oral Daily   carvedilol  12.5 mg Oral BID   clopidogrel  75 mg Oral Q breakfast   dofetilide  500 mcg Oral BID   famotidine  20 mg Oral BID   [START ON 06/09/2022] levothyroxine  137 mcg Oral QAC breakfast   loratadine  10 mg Oral Daily   metFORMIN  500 mg Oral BID WC   pantoprazole  40 mg Oral BID   predniSONE  5 mg Oral Q breakfast   sodium chloride flush  3 mL Intravenous Q12H   venlafaxine XR  150 mg Oral Daily   warfarin  2.5 mg Oral Once per day on Mon   warfarin  5 mg Oral Once per day on Sun Tue Wed Thu Fri Sat   Warfarin - Pharmacist Dosing Inpatient   Does not apply q1600   Continuous Infusions:  sodium chloride     PRN Meds: sodium chloride, mouth rinse, sodium chloride flush   Vital Signs    Vitals:   06/07/22 1618 06/07/22 2009 06/08/22 0009 06/08/22 0458  BP:  (!) 158/80 137/63 (!) 142/80  Pulse:  60 (!) 51 (!) 55  Resp:  18 17 18   Temp:  97.6 F (36.4 C) 98.1 F (36.7 C) 98.6 F (37 C)  TempSrc:  Oral Oral Oral  SpO2:  94% 95% 94%  Weight: 103.1 kg     Height: 5\' 10"  (1.778 m)      No intake or output data in the 24 hours ending 06/08/22 0706 Filed Weights   06/07/22 1618  Weight: 103.1 kg    Physical Exam    GEN- NAD, A&O x 3. Normal affect.  Lungs- CTAB, Normal effort.  Heart-  Slow, regular  rate and rhythm. No M/G/R GI- Soft, NT, ND Extremities- No clubbing, cyanosis, or edema Skin- no rash or lesion  Labs    CBC No results for input(s): "WBC", "NEUTROABS", "HGB", "HCT",  "MCV", "PLT" in the last 72 hours. Basic Metabolic Panel Recent Labs    16/10/96 1133 06/08/22 0509  NA 140 141  K 3.8 3.8  CL 102 104  CO2 26 28  GLUCOSE 116* 105*  BUN 18 21  CREATININE 0.91 0.96  CALCIUM 9.2 9.2  MG 2.1 2.1    Telemetry    Sinus bradycardia 40-50s (personally reviewed)  Patient Profile     Gary [name] is a 77 y.o. male with a past medical history significant for persistent atrial fibrillation.  They were admitted for tikosyn load.   Assessment & Plan    Persistent atrial fibrillation Pt remains in NSR on Tikosyn 500 mcg BID  Continue Coumadin Creatinine, ser  0.96 (06/04 0509) Magnesium  2.1 (06/04 0509) Potassium3.8 (06/04 0509) Supplement K  Sinus brady Continue coreg 12.5 mg BID for now.   Plan for home Thursday if QTc remains stable.   For questions or updates, please contact [facility] Please consult [email or web address] for contact info under Cardiology/STEMI.  Signed, Graciella Freer, PA-C  06/08/2022, 7:06 AM   I have seen and examined this patient with Gary [name].  Agree with above, note added to reflect my findings.  Feeling well without complaint  GEN: Well nourished, well developed, in no acute distress  HEENT: normal  Neck: no JVD, carotid bruits, or masses Cardiac: RRR; no murmurs, rubs, or gallops,no edema  Respiratory:  clear to auscultation bilaterally, normal work of breathing GI: soft, nontender, nondistended, + BS MS: no deformity or atrophy  Skin: warm and dry Neuro:  Strength and sensation are intact Psych: euthymic mood, full affect   Persistent atrial fibrillation: Remains in sinus rhythm.  Currently on dofetilide 500 mcg twice daily and Coumadin.  Potassium magnesium within normal limits.  Gary [name] continue with current management.  Supplement potassium today. Sinus bradycardia: Continue carvedilol  Gary Herberg M. Rayman Petrosian MD 06/08/2022 2:44 PM

## 2022-06-08 NOTE — Care Management (Signed)
06-08-22 Case Manager spoke with the patient regarding co pay cost. Patient is agreeable to cost and would like to have the initial Rx filled via [facility] and the Rx refills escribed to [facility]. No further needs identified at this time.

## 2022-06-09 ENCOUNTER — Encounter (HOSPITAL_COMMUNITY)
Admission: RE | Disposition: A | Discharge status: Home / Self Care | Payer: Self-pay | Source: Ambulatory Visit | Attending: Cardiology

## 2022-06-09 DIAGNOSIS — I4819 Other persistent atrial fibrillation: Secondary | ICD-10-CM | POA: Diagnosis not present

## 2022-06-09 LAB — BASIC METABOLIC PANEL
Anion gap: 11 (ref 5–15)
BUN: 26 mg/dL — ABNORMAL HIGH (ref 8–23)
CO2: 25 mmol/L (ref 22–32)
Calcium: 9.1 mg/dL (ref 8.9–10.3)
Chloride: 104 mmol/L (ref 98–111)
Creatinine, Ser: 1.12 mg/dL (ref 0.61–1.24)
GFR, Estimated: >60 mL/min (ref >60)
Glucose, Bld: 95 mg/dL (ref 70–99)
Potassium: 3.8 mmol/L (ref 3.5–5.1)
Sodium: 140 mmol/L (ref 135–145)

## 2022-06-09 LAB — MAGNESIUM: Magnesium: 2.1 mg/dL (ref 1.7–2.4)

## 2022-06-09 LAB — PROTIME-INR
INR: 1.7 — ABNORMAL HIGH (ref 0.8–1.2)
Prothrombin Time: 20.2 seconds — ABNORMAL HIGH (ref 11.4–15.2)

## 2022-06-09 SURGERY — CARDIOVERSION
Anesthesia: Monitor Anesthesia Care

## 2022-06-09 MED ORDER — ALPRAZOLAM 0.5 MG PO TABS
0.5000 mg | ORAL_TABLET | Freq: Every day | ORAL | Status: DC
  Administered 2022-06-09: 0.5 mg via ORAL
  Filled 2022-06-09: qty 1

## 2022-06-09 MED ORDER — WARFARIN SODIUM 7.5 MG PO TABS
7.5000 mg | ORAL_TABLET | Freq: Once | ORAL | Status: AC
  Administered 2022-06-09: 7.5 mg via ORAL
  Filled 2022-06-09: qty 1

## 2022-06-09 MED ORDER — POTASSIUM CHLORIDE CRYS ER 20 MEQ PO TBCR
40.0000 meq | EXTENDED_RELEASE_TABLET | Freq: Once | ORAL | Status: AC
  Administered 2022-06-09: 40 meq via ORAL
  Filled 2022-06-09: qty 2

## 2022-06-09 NOTE — Progress Notes (Signed)
Pharmacy: Dofetilide (Tikosyn) - Follow Up Assessment and Electrolyte Replacement  Pharmacy consulted to assist in monitoring and replacing electrolytes in this 77 y.o. male admitted on 06/07/2022 undergoing dofetilide initiation.   Labs:    Component Value Date/Time   K 3.8 06/09/2022 0256   MG 2.1 06/09/2022 0256     Plan: Potassium: K 3.8-3.9:  Give KCl 40 mEq po x1   Magnesium: Mg > 2: No additional supplementation needed   As patient has required on average ~30 mEq of potassium replacement every day, recommend discharging patient with prescription for:  Potassium chloride 20 mEq  daily  Thank you for allowing pharmacy to participate in this patient's care   Harland German, PharmD Clinical Pharmacist **Pharmacist phone directory can now be found on [email or web address] ([email or web address]).  Listed under [facility].

## 2022-06-09 NOTE — Plan of Care (Signed)
  Problem: Activity: Goal: Ability to tolerate increased activity will improve Outcome: Progressing   Problem: Cardiac: Goal: Ability to achieve and maintain adequate cardiopulmonary perfusion will improve Outcome: Progressing   Problem: Health Behavior/Discharge Planning: Goal: Ability to safely manage health-related needs after discharge will improve Outcome: Progressing   

## 2022-06-09 NOTE — Progress Notes (Addendum)
Electrophysiology Rounding Note  Patient Name: Gary [name] Date of Encounter: 06/09/2022  Primary Cardiologist: Chilton Si, MD  Electrophysiologist: None    Subjective   Pt remains in NSR on Tikosyn 500 mcg BID   QTc from EKG last pm shows stable QTc at 470, better when measured manually.  The patient is doing well today.  At this time, the patient denies chest pain, shortness of breath, or any new concerns.  Inpatient Medications    Scheduled Meds:  amLODipine  2.5 mg Oral Daily   carvedilol  12.5 mg Oral BID   clopidogrel  75 mg Oral Q breakfast   dofetilide  500 mcg Oral BID   famotidine  20 mg Oral BID   levothyroxine  137 mcg Oral QAC breakfast   loratadine  10 mg Oral Daily   metFORMIN  500 mg Oral BID WC   pantoprazole  40 mg Oral BID   predniSONE  5 mg Oral Q breakfast   sodium chloride flush  3 mL Intravenous Q12H   venlafaxine XR  150 mg Oral Daily   warfarin  2.5 mg Oral Once per day on Mon   warfarin  5 mg Oral Once per day on Sun Tue Wed Thu Fri Sat   Warfarin - Pharmacist Dosing Inpatient   Does not apply q1600   Continuous Infusions:  sodium chloride     PRN Meds: sodium chloride, mouth rinse, sodium chloride flush   Vital Signs    Vitals:   06/08/22 0458 06/08/22 1100 06/08/22 1529 06/08/22 2044  BP: (!) 142/80 134/71 (!) 138/58 115/72  Pulse: (!) 55 61 (!) 50   Resp: 18 17 16 18   Temp: 98.6 F (37 C) 97.7 F (36.5 C) (!) 97.5 F (36.4 C) 97.9 F (36.6 C)  TempSrc: Oral Oral Oral Oral  SpO2: 94% 95%  96%  Weight:      Height:        Intake/Output Summary (Last 24 hours) at 06/09/2022 0720 Last data filed at 06/08/2022 2030 Gross per 24 hour  Intake 240 ml  Output --  Net 240 ml   Filed Weights   06/07/22 1618  Weight: 103.1 kg    Physical Exam    GEN- NAD, A&O x 3. Normal affect.  Lungs- CTAB, Normal effort.  Heart- Regular rate and rhythm. No M/G/R GI- Soft, NT, ND Extremities- No clubbing, cyanosis, or  edema Skin- no rash or lesion  Labs    CBC No results for input(s): "WBC", "NEUTROABS", "HGB", "HCT", "MCV", "PLT" in the last 72 hours. Basic Metabolic Panel Recent Labs    16/10/96 0509 06/09/22 0256  NA 141 140  K 3.8 3.8  CL 104 104  CO2 28 25  GLUCOSE 105* 95  BUN 21 26*  CREATININE 0.96 1.12  CALCIUM 9.2 9.1  MG 2.1 2.1    Telemetry    Sinus brady 40s overnight, 50-60s while awake, 70-80s with ambulation (personally reviewed)  Patient Profile     Gary [name] is a 77 y.o. male with a past medical history significant for persistent atrial fibrillation.  They were admitted for tikosyn load.   Assessment & Plan    Persistent atrial fibrillation Pt remains in NSR on Tikosyn 500 mcg BID  Continue Coumadin Creatinine, ser  1.12 (06/05 0256) Magnesium  2.1 (06/05 0256) Potassium3.8 (06/05 0256) Supplement K  Sinus bradycardia Mostly nocturnal, asymptomatic.   Plan for home Thursday if QTc remains stable.   For questions or  updates, please contact [facility] Please consult [email or web address] for contact info under Cardiology/STEMI.  Signed, Graciella Freer, PA-C  06/09/2022, 7:20 AM   I have seen and examined this patient with Otilio Saber.  Agree with above, note added to reflect my findings.  Patient remains in sinus rhythm.  Currently feeling well.  No acute complaints.  GEN: Well nourished, well developed, in no acute distress  HEENT: normal  Neck: no JVD, carotid bruits, or masses Cardiac: RRR; no murmurs, rubs, or gallops,no edema  Respiratory:  clear to auscultation bilaterally, normal work of breathing GI: soft, nontender, nondistended, + BS MS: no deformity or atrophy  Skin: warm and dry Neuro:  Strength and sensation are intact Psych: euthymic mood, full affect   Persistent atrial fibrillation: Maintaining sinus rhythm on dofetilide.  Ongoing load.  Continue Coumadin.  Gary [name] supplement potassium today.  Otherwise no changes. Sinus  bradycardia: Patient is asymptomatic.  No plans for pacemaker implant.  Gary [name] M. Janyce Ellinger MD 06/09/2022 1:06 PM

## 2022-06-09 NOTE — Progress Notes (Signed)
ANTICOAGULATION CONSULT NOTE   Pharmacy Consult for warfarin Indication: atrial fibrillation  Allergies  Allergen Reactions   Farxiga [Dapagliflozin]     Lightheaded and low blood pressure    Statins Other (See Comments)    Muscle pain    Vital Signs: BP: 151/67 (06/05 0820)  Labs: Recent Labs    06/07/22 0850 06/07/22 1133 06/08/22 0509 06/09/22 0256  LABPROT  --   --  22.8* 20.2*  INR 2.4  --  2.0* 1.7*  CREATININE  --  0.91 0.96 1.12     Estimated Creatinine Clearance: 64.3 mL/min (by C-G formula based on SCr of 1.12 mg/dL).   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 Takes at 11:00pm      amLODipine (NORVASC) 2.5 MG tablet Take 2.5 mg by mouth daily. Takes at 6:30am      calcium carbonate (OS-CAL) 600 MG TABS tablet Take 600 mg by mouth daily with breakfast.      carvedilol (COREG) 25 MG tablet Take 0.5 tablets (12.5 mg total) by mouth 2 (two) times daily. (Patient taking differently: Take 12.5 mg by mouth 2 (two) times daily. Takes at 6:30am and 5:30pm)      Cholecalciferol (VITAMIN D3 PO) Taking 250 mcg by mouth daily Takes at 6:30am      clopidogrel (PLAVIX) 75 MG tablet Take 1 tablet (75 mg total) by mouth daily with breakfast. Take every day for 1 year (Patient taking differently: Take 75 mg by mouth daily  with breakfast. Takes at 4am) 90 tablet 3    Coenzyme Q10 (CO Q 10 PO) Take 400 mg by mouth daily. Takes at 6:30am      Evolocumab (REPATHA SURECLICK) 140 MG/ML SOAJ INJECT 140 MG INTO THE SKIN EVERY 14 (FOURTEEN) DAYS. INJECT 1 PEN INTO THE FATTY TISSUE SKIN EVERY 14 DAY (Patient taking differently: Inject 140 mg into the skin every 14 (fourteen) days.) 6 mL 3    famotidine (PEPCID) 20 MG tablet Take 1 tablet (20 mg total) by mouth 2 (two) times daily. (Patient taking differently: Take 20 mg by mouth 2 (two) times daily. Takes at 6:30am and 5:30pm)      ferrous sulfate 325 (65 FE) MG EC tablet Take 325 mg by mouth at bedtime. Takes at 5:30pm      furosemide (LASIX) 40 MG tablet Take 1 tablet (40 mg total) by mouth daily as needed. (Patient taking differently: Take 40 mg by mouth daily as needed. ONLY as needed) 30 tablet 3    levothyroxine (SYNTHROID, LEVOTHROID) 137 MCG tablet Take 137 mcg by mouth daily before breakfast. Takes at 4 am      loratadine (CLARITIN) 10 MG tablet Take 10 mg by mouth daily. Takes at 6:30am      metFORMIN (GLUCOPHAGE)  500 MG tablet Take 500 mg by mouth 2 (two) times daily. Takes at 6:30am and 5:30pm  12    Multiple Vitamin (MULTIVITAMIN WITH MINERALS) TABS tablet Take 1 tablet by mouth daily. Centrum silver men Takes at 6:30am      nitroGLYCERIN (NITROSTAT) 0.4 MG SL tablet Place 1 tablet (0.4 mg total) under the tongue every 5 (five) minutes x 3 doses as needed for chest pain. 25 tablet 12    pantoprazole (PROTONIX) 40 MG tablet Take 40 mg by mouth 2 (two) times daily. Takes at 6:30am and 5:30pm      predniSONE (DELTASONE) 5 MG tablet Take 5 mg by mouth daily with breakfast. Takes at 6:30am      riTUXimab (RITUXAN IV) Inject 1 application  into the vein See admin instructions. Take every two weeks and every six months alternating      testosterone cypionate (DEPOTESTOTERONE CYPIONATE) 100 MG/ML injection Inject 100 mg into the muscle every 14 (fourteen) days.       venlafaxine XR (EFFEXOR-XR) 150 MG 24 hr capsule Take 150 mg by mouth daily. Takes at 6:30am      warfarin (COUMADIN) 5 MG tablet TAKE 1/2 TABLET TO 1 TABLET BY MOUTH DAILY AS DIRECTED BY COUMADIN CLINIC (Patient taking differently: TAKE 1/2 TABLET TO 1 TABLET BY MOUTH DAILY AS DIRECTED BY COUMADIN CLINIC 5mg - Takes on Sunday, Tuesday, Thursday and Saturday 2.5mg - Takes on Monday, Wednesday and Friday) 90 tablet 0    Zinc Chelated 22.5 MG TABS Take 22.5 mg by mouth daily. Takes at 6:30am       Assessment: 77 yo male here for Tikosyn initiation. He is on warfarin PTA for afib and pharmacy consulted to dose -INR= 1.7  Home warfarin dose: 5mg /d and 2.5mg  Mon; INR= 2.4 on 6/3   Goal of Therapy:  INR 2-3 Monitor platelets by anticoagulation protocol: Yes   Plan:  -Warfarin 7.5mg  today  -Daily INR  Harland German, PharmD Clinical Pharmacist **Pharmacist phone directory can now be found on [email or web address] ([email or web address]).  Listed under [facility].

## 2022-06-09 NOTE — Plan of Care (Signed)

## 2022-06-10 ENCOUNTER — Other Ambulatory Visit (HOSPITAL_COMMUNITY): Payer: Self-pay

## 2022-06-10 DIAGNOSIS — I4819 Other persistent atrial fibrillation: Secondary | ICD-10-CM | POA: Diagnosis not present

## 2022-06-10 LAB — BASIC METABOLIC PANEL
Anion gap: 8 (ref 5–15)
BUN: 22 mg/dL (ref 8–23)
CO2: 24 mmol/L (ref 22–32)
Calcium: 8.7 mg/dL — ABNORMAL LOW (ref 8.9–10.3)
Chloride: 108 mmol/L (ref 98–111)
Creatinine, Ser: 0.88 mg/dL (ref 0.61–1.24)
GFR, Estimated: >60 mL/min (ref >60)
Glucose, Bld: 98 mg/dL (ref 70–99)
Potassium: 3.6 mmol/L (ref 3.5–5.1)
Sodium: 140 mmol/L (ref 135–145)

## 2022-06-10 LAB — PROTIME-INR
INR: 1.9 — ABNORMAL HIGH (ref 0.8–1.2)
Prothrombin Time: 22.4 seconds — ABNORMAL HIGH (ref 11.4–15.2)

## 2022-06-10 LAB — MAGNESIUM: Magnesium: 2.1 mg/dL (ref 1.7–2.4)

## 2022-06-10 MED ORDER — POTASSIUM CHLORIDE CRYS ER 20 MEQ PO TBCR
40.0000 meq | EXTENDED_RELEASE_TABLET | Freq: Every day | ORAL | 6 refills | Status: DC
  Filled 2022-06-10: qty 60, 30d supply, fill #0

## 2022-06-10 MED ORDER — POTASSIUM CHLORIDE CRYS ER 20 MEQ PO TBCR
60.0000 meq | EXTENDED_RELEASE_TABLET | Freq: Once | ORAL | Status: AC
  Administered 2022-06-10: 60 meq via ORAL
  Filled 2022-06-10: qty 3

## 2022-06-10 MED ORDER — DOFETILIDE 500 MCG PO CAPS
500.0000 ug | ORAL_CAPSULE | Freq: Two times a day (BID) | ORAL | 6 refills | Status: DC
  Filled 2022-06-10: qty 60, 30d supply, fill #0

## 2022-06-10 NOTE — Discharge Summary (Addendum)
ELECTROPHYSIOLOGY PROCEDURE DISCHARGE SUMMARY    Patient ID: Gary [name],  MRN: [id number], DOB/AGE: [birth year] 46 y.o.  Admit date: 06/07/2022 Discharge date: 06/10/2022  Primary Care Physician: Charlane Ferretti, DO  Primary Cardiologist: Chilton Si, MD  Electrophysiologist: Dr. Elberta Fortis   Primary Discharge Diagnosis:  1.  Persistent atrial fibrillation status post Tikosyn loading this admission  Secondary Discharge Diagnosis:  2. Sinus bradycardia  Allergies  Allergen Reactions   Farxiga [Dapagliflozin]     Lightheaded and low blood pressure    Statins Other (See Comments)    Muscle pain     Procedures This Admission:  1.  Tikosyn loading   Brief HPI: Gary [name] is a 77 y.o. male with a past medical history as noted above.  They were referred to EP for treatment options of atrial fibrillation.  Risks, benefits, and alternatives to Tikosyn were reviewed with the patient who wished to proceed with admission for loading.  Hospital Course:  The patient was admitted and Tikosyn was initiated.  Renal function and electrolytes were followed during the hospitalization.  Their QTc remained stable. The patient presented in sinus rhythm and did not require cardioversion. The patients QTc remained stable. They were monitored on telemetry up to discharge. On the day of discharge, they were examined by Dr. Elberta Fortis  who considered them stable for discharge to home.  Follow-up has been arranged with the Atrial Fibrillation clinic in approximately 1 week.   Physical Exam: Vitals:   06/09/22 1043 06/09/22 2227 06/10/22 0507 06/10/22 0859  BP: 120/61 (!) 149/77 (!) 141/83 135/64  Pulse: (!) 51 (!) 51    Resp: 16 18 16    Temp: 98 F (36.7 C) 97.9 F (36.6 C) 97.9 F (36.6 C)   TempSrc: Oral Oral Oral   SpO2: 96%     Weight:      Height:        GEN- NAD, A&O x 3. Normal affect.  Lungs- CTAB, Normal effort.  Heart- Regular rate and rhythm. No M/G/R GI- Soft, NT,  ND Extremities- No clubbing, cyanosis, or edema Skin- no rash or lesion  Labs:   Lab Results  Component Value Date   WBC 10.2 03/06/2022   HGB 13.6 05/13/2022   HCT 40.0 05/13/2022   MCV 76.7 (L) 03/06/2022   PLT 466 (H) 03/06/2022    Recent Labs  Lab 06/10/22 0508  NA 140  K 3.6  CL 108  CO2 24  BUN 22  CREATININE 0.88  CALCIUM 8.7*  GLUCOSE 98    Discharge Medications:  Allergies as of 06/10/2022       Reactions   Farxiga [dapagliflozin]    Lightheaded and low blood pressure    Statins Other (See Comments)   Muscle pain        Medication List     TAKE these medications    ALPRAZolam 0.5 MG tablet Commonly known as: XANAX Take 0.5 mg by mouth at bedtime. Takes at 11:00pm   amLODipine 2.5 MG tablet Commonly known as: NORVASC Take 2.5 mg by mouth daily. Takes at 6:30am   calcium carbonate 600 MG Tabs tablet Commonly known as: OS-CAL Take 600 mg by mouth daily with breakfast.   carvedilol 25 MG tablet Commonly known as: COREG Take 0.5 tablets (12.5 mg total) by mouth 2 (two) times daily. What changed: additional instructions   clopidogrel 75 MG tablet Commonly known as: PLAVIX Take 1 tablet (75 mg total) by mouth daily with breakfast. Take every  day for 1 year What changed: additional instructions   CO Q 10 PO Take 400 mg by mouth daily. Takes at 6:30am   dofetilide 500 MCG capsule Commonly known as: TIKOSYN Take 1 capsule (500 mcg total) by mouth 2 (two) times daily.   famotidine 20 MG tablet Commonly known as: Pepcid Take 1 tablet (20 mg total) by mouth 2 (two) times daily. What changed: additional instructions   ferrous sulfate 325 (65 FE) MG EC tablet Take 325 mg by mouth at bedtime. Takes at 5:30pm   furosemide 40 MG tablet Commonly known as: Lasix Take 1 tablet (40 mg total) by mouth daily as needed. What changed: additional instructions   levothyroxine 137 MCG tablet Commonly known as: SYNTHROID Take 137 mcg by mouth daily  before breakfast. Takes at 4 am   loratadine 10 MG tablet Commonly known as: CLARITIN Take 10 mg by mouth daily. Takes at 6:30am   metFORMIN 500 MG tablet Commonly known as: GLUCOPHAGE Take 500 mg by mouth 2 (two) times daily. Takes at 6:30am and 5:30pm   multivitamin with minerals Tabs tablet Take 1 tablet by mouth daily. Centrum silver men Takes at 6:30am   nitroGLYCERIN 0.4 MG SL tablet Commonly known as: NITROSTAT Place 1 tablet (0.4 mg total) under the tongue every 5 (five) minutes x 3 doses as needed for chest pain.   pantoprazole 40 MG tablet Commonly known as: PROTONIX Take 40 mg by mouth 2 (two) times daily. Takes at 6:30am and 5:30pm   potassium chloride SA 20 MEQ tablet Commonly known as: KLOR-CON M Take 2 tablets (40 mEq total) by mouth daily. Start taking on: June 11, 2022   predniSONE 5 MG tablet Commonly known as: DELTASONE Take 5 mg by mouth daily with breakfast. Takes at 6:30am   Repatha SureClick 140 MG/ML Soaj Generic drug: Evolocumab INJECT 140 MG INTO THE SKIN EVERY 14 (FOURTEEN) DAYS. INJECT 1 PEN INTO THE FATTY TISSUE SKIN EVERY 14 DAY What changed: See the new instructions.   RITUXAN IV Inject 1 application  into the vein See admin instructions. Take every two weeks and every six months alternating   testosterone cypionate 100 MG/ML injection Commonly known as: DEPOTESTOTERONE CYPIONATE Inject 100 mg into the muscle every 14 (fourteen) days.   venlafaxine XR 150 MG 24 hr capsule Commonly known as: EFFEXOR-XR Take 150 mg by mouth daily. Takes at 6:30am   VITAMIN D3 PO Taking 250 mcg by mouth daily Takes at 6:30am   warfarin 5 MG tablet Commonly known as: COUMADIN Take as directed. If you are unsure how to take this medication, talk to your nurse or doctor. Original instructions: TAKE 1/2 TABLET TO 1 TABLET BY MOUTH DAILY AS DIRECTED BY COUMADIN CLINIC What changed: See the new instructions.   Zinc Chelated 22.5 MG Tabs Take 22.5 mg by  mouth daily. Takes at 6:30am        Disposition:    Follow-up Information     [facility] Follow up.   Specialty: Cardiology Why: on 6/6 at 0830 Contact information: [address] [id number] Gary [name] [id number] [phone]                Duration of Discharge Encounter: Greater than 30 minutes including physician time.  Signed, Graciella Freer, PA-C  06/10/2022 11:45 AM    I have seen and examined this patient with Otilio Saber.  Agree with above, note added to reflect my findings.  Patient manage for dofetilide load.  Patient was in sinus rhythm at presentation.  He remained in sinus rhythm without significant change in QTc.  Colie Fugitt plan for discharge today with follow-up in clinic.  GEN: Well nourished, well developed, in no acute distress  HEENT: normal  Neck: no JVD, carotid bruits, or masses Cardiac: RRR; no murmurs, rubs, or gallops,no edema  Respiratory:  clear to auscultation bilaterally, normal work of breathing GI: soft, nontender, nondistended, + BS MS: no deformity or atrophy  Skin: warm and dry Neuro:  Strength and sensation are intact Psych: euthymic mood, full affect     Cressida Milford M. Hesham Womac MD 06/10/2022 2:01 PM

## 2022-06-10 NOTE — Progress Notes (Signed)
Pharmacy: Dofetilide (Tikosyn) - Follow Up Assessment and Electrolyte Replacement  Pharmacy consulted to assist in monitoring and replacing electrolytes in this 77 y.o. male admitted on 06/07/2022 undergoing dofetilide initiation.   Labs:    Component Value Date/Time   K 3.6 06/10/2022 0508   MG 2.1 06/10/2022 0508     Plan: Potassium: K 3.5-3.7:  Give KCl 60 mEq po x1   Magnesium: Mg > 2: No additional supplementation needed   As patient has required on average 40 mEq of potassium replacement every day, recommend discharging patient with prescription for:  Potassium chloride 40 mEq  daily  Thank you for allowing pharmacy to participate in this patient's care   Harland German, PharmD Clinical Pharmacist **Pharmacist phone directory can now be found on [email or web address] ([email or web address]).  Listed under [facility].

## 2022-06-10 NOTE — Progress Notes (Signed)
EKG from yesterday evening 06/09/2022 reviewed     Shows remains in NSR with stable QTc at ~460-470 ms.  Continue  Tikosyn 500 mcg BID.   Potassium3.6 (06/06 0508) - Supped by Pharmacy Magnesium  2.1 (06/06 0508) Creatinine, ser  0.88 (06/06 0508)  Plan for home this afternoon if QTc remains stable.   Graciella Freer, PA-C  06/10/2022 8:01 AM

## 2022-06-10 NOTE — Care Management Important Message (Signed)
Important Message  Patient Details  Name: Gary [name] MRN: [id number] Date of Birth: 1945/05/08   Medicare Important Message Given:        Renie Ora 06/10/2022, 9:37 AM

## 2022-06-17 ENCOUNTER — Ambulatory Visit (HOSPITAL_COMMUNITY)
Admit: 2022-06-17 | Discharge: 2022-06-17 | Disposition: A | Discharge status: Home / Self Care | Payer: Medicare Other | Source: Ambulatory Visit | Attending: Internal Medicine | Admitting: Internal Medicine

## 2022-06-17 ENCOUNTER — Ambulatory Visit: Payer: Medicare Other

## 2022-06-17 ENCOUNTER — Encounter (HOSPITAL_COMMUNITY): Payer: Self-pay | Admitting: Internal Medicine

## 2022-06-17 VITALS — BP 126/74 | HR 49 | Ht 70.0 in | Wt 229.4 lb

## 2022-06-17 DIAGNOSIS — I480 Paroxysmal atrial fibrillation: Secondary | ICD-10-CM | POA: Diagnosis not present

## 2022-06-17 DIAGNOSIS — I2510 Atherosclerotic heart disease of native coronary artery without angina pectoris: Secondary | ICD-10-CM | POA: Insufficient documentation

## 2022-06-17 DIAGNOSIS — D6869 Other thrombophilia: Secondary | ICD-10-CM | POA: Diagnosis not present

## 2022-06-17 DIAGNOSIS — Z79899 Other long term (current) drug therapy: Secondary | ICD-10-CM | POA: Diagnosis not present

## 2022-06-17 DIAGNOSIS — G4733 Obstructive sleep apnea (adult) (pediatric): Secondary | ICD-10-CM | POA: Insufficient documentation

## 2022-06-17 DIAGNOSIS — I5042 Chronic combined systolic (congestive) and diastolic (congestive) heart failure: Secondary | ICD-10-CM | POA: Insufficient documentation

## 2022-06-17 DIAGNOSIS — E785 Hyperlipidemia, unspecified: Secondary | ICD-10-CM | POA: Diagnosis not present

## 2022-06-17 DIAGNOSIS — Z7901 Long term (current) use of anticoagulants: Secondary | ICD-10-CM | POA: Insufficient documentation

## 2022-06-17 DIAGNOSIS — I4819 Other persistent atrial fibrillation: Secondary | ICD-10-CM | POA: Diagnosis not present

## 2022-06-17 DIAGNOSIS — Z951 Presence of aortocoronary bypass graft: Secondary | ICD-10-CM | POA: Insufficient documentation

## 2022-06-17 DIAGNOSIS — I714 Abdominal aortic aneurysm, without rupture, unspecified: Secondary | ICD-10-CM | POA: Diagnosis not present

## 2022-06-17 DIAGNOSIS — Z5181 Encounter for therapeutic drug level monitoring: Secondary | ICD-10-CM | POA: Diagnosis not present

## 2022-06-17 DIAGNOSIS — I110 Hypertensive heart disease with heart failure: Secondary | ICD-10-CM | POA: Insufficient documentation

## 2022-06-17 LAB — BASIC METABOLIC PANEL
Anion gap: 8 (ref 5–15)
BUN: 14 mg/dL (ref 8–23)
CO2: 27 mmol/L (ref 22–32)
Calcium: 9.2 mg/dL (ref 8.9–10.3)
Chloride: 105 mmol/L (ref 98–111)
Creatinine, Ser: 0.92 mg/dL (ref 0.61–1.24)
GFR, Estimated: >60 mL/min (ref >60)
Glucose, Bld: 110 mg/dL — ABNORMAL HIGH (ref 70–99)
Potassium: 4.5 mmol/L (ref 3.5–5.1)
Sodium: 140 mmol/L (ref 135–145)

## 2022-06-17 LAB — MAGNESIUM: Magnesium: 2.1 mg/dL (ref 1.7–2.4)

## 2022-06-17 MED ORDER — POTASSIUM CHLORIDE CRYS ER 20 MEQ PO TBCR: 40.0000 meq | EXTENDED_RELEASE_TABLET | Freq: Every day | ORAL | 3 refills | Status: DC

## 2022-06-17 MED ORDER — DOFETILIDE 500 MCG PO CAPS: 500.0000 ug | ORAL_CAPSULE | Freq: Two times a day (BID) | ORAL | 3 refills | Status: DC

## 2022-06-17 NOTE — Progress Notes (Signed)
Primary Care Physician: Charlane Ferretti, DO Primary Cardiologist: Dr. Duke Salvia Primary Electrophysiologist: N/A Referring Physician: Dr. Alinda Deem Gary [name] is a 77 y.o. male with a history of hypertension, melanoma, CAD s/p PCI and CABG, mild ascending aortic aneurysm, chronic systolic and diastolic heart failure, hyperlipidemia, paroxysmal atrial fibrillation, and OSA non-compliant with CPAP who presents for consultation in the [facility]. Patient is on coumadin for a CHADS2VASC score of 6.  He underwent scheduled cardioversion on 02/26/22, for afib that was found in January. CV was successful in converting to sinus rhythm on the second shock .Unfortunately, he was admitted from 2/26-3/2 for aspiration pneumonia possibly secondary to  intubation/ DCCV as pt was on Ozempic, which was not stopped and he did not want to delay CV. He appeared to have been in sinus rhythm during admission. He is currently on carvedilol and warfarin. No bleeding concerns. He does state was on eliquis a couple years ago but switched to coumadin due to cost while in the donut hole. He is in rate controlled afib today and was not aware.   He cannot specifically tell me when he went back into Afib since hospital discharge. He admits to overall feeling low energy but it is not new and has felt this way for a while. He definitely feels better than he did 2 weeks ago. Has had Afib for about 4 years intermittently; required cardioversion about a year or so ago. Denies shortness of breath or palpitations. He is now compliant with his CPAP.   On follow up 05/11/22, he is currently in rate controlled Afib. He is here today for Tikosyn admission after plan confirmed at OV with Dr. Elberta Fortis on 04/12/22. Medication list reviewed by pharmacy and no contraindications noted. He has completed 4 therapeutic INR checks being on coumadin. He has not taken benadryl in the last week. He does note some bilateral  ankle swelling since stopping aldactone.  On follow up 05/24/22, he is currently in NSR. S/p successful DCCV on 5/9 to NSR. He was originally scheduled for Tikosyn admission but this was canceled unfortunately due to being on bactrim. This medication was added to his list after pharmacy review. Seen by rheumatologist Dr. Dierdre Forth on 5/15 and bactrim was discontinued; placed on prednisone 5 mg daily. He has continued INR checks as directed and none have been below 2.0; most recent INR on 5/17 was 3.4.   On follow up 06/07/22, he is currently in NSR. He is here today for Tikosyn admission. He is currently on prednisone daily instead of bactrim. He has completed 4 therapeutic INR checks. He has not begun any new medications since last office visit. He has taken all his morning medications as directed.  On follow up 06/17/22, he is s/p Tikosyn admission 6/3-6/24. He did not require DCCV during stay. He was discharged on 500 mcg BID dosage. He is doing well since discharge and has not had any episodes of Afib. He has remained compliant on Tikosyn 500 mcg BID dosing. Potassium supplementation increased. He has noted excess flatulence since discharge.   Today, he denies symptoms of palpitations, chest pain, shortness of breath, orthopnea, PND, lower extremity edema, dizziness, presyncope, syncope, snoring, daytime somnolence, bleeding, or neurologic sequela. The patient is tolerating medications without difficulties and is otherwise without complaint today.    Atrial Fibrillation Risk Factors:  he does have symptoms or diagnosis of sleep apnea. he is not compliant with CPAP therapy. he does not have a  history of rheumatic fever. he does not have a history of alcohol use. The patient does not have a history of early familial atrial fibrillation or other arrhythmias.  he has a BMI of Body mass index is 32.92 kg/m.Marland Kitchen Filed Weights   06/17/22 0825  Weight: 104.1 kg      Family History  Problem Relation  Age of Onset   Heart attack Mother    Other Father        odd stomach disorder   Stroke Maternal Grandmother    Heart attack Maternal Grandfather    Heart attack Paternal Grandmother    Heart attack Paternal Grandfather      Atrial Fibrillation Management history:  Previous antiarrhythmic drugs: None Previous cardioversions: 02/26/22 Previous ablations: None CHADS2VASC score: 6 Anticoagulation history: Coumadin   Past Medical History:  Diagnosis Date   Aortic aneurysm (HCC) 02/20/2016   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   Arthritis    Atrial fibrillation (HCC) 11/13/2014   Cancer (HCC)    skin   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/2016   LVEF 45-50% 11/2014   Coronary artery disease    Depression    Essential hypertension 11/13/2014   Hyperlipidemia    Hyperlipidemia 11/13/2014   Hypertension    Sleep apnea    CPAP 'Uses half the time"  last study 2009   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   Past Surgical History:  Procedure Laterality Date   CARDIOVERSION N/A 12/06/2014   Procedure: CARDIOVERSION;  Surgeon: Chilton Si, MD;  Location: [facility] ENDOSCOPY;  Service: Cardiovascular;  Laterality: N/A;   CARDIOVERSION N/A 05/26/2017   Procedure: CARDIOVERSION;  Surgeon: Chilton Si, MD;  Location: [facility] ENDOSCOPY;  Service: Cardiovascular;  Laterality: N/A;   CARDIOVERSION N/A 04/25/2019   Procedure: CARDIOVERSION;  Surgeon: Thurmon Fair, MD;  Location: [facility] ENDOSCOPY;  Service: Cardiovascular;  Laterality: N/A;   CARDIOVERSION N/A 02/26/2022   Procedure: CARDIOVERSION;  Surgeon: Chilton Si, MD;  Location: [facility] ENDOSCOPY;  Service: Cardiovascular;  Laterality: N/A;   CARDIOVERSION N/A 05/13/2022   Procedure: CARDIOVERSION;  Surgeon: Quintella Reichert, MD;  Location: [facility] INVASIVE CV LAB;  Service: Cardiovascular;  Laterality: N/A;   CARPAL TUNNEL RELEASE     CORONARY ANGIOPLASTY     multiple stents   CORONARY ARTERY BYPASS GRAFT  2006    CORONARY STENT INTERVENTION N/A 09/28/2021   Procedure: CORONARY STENT INTERVENTION;  Surgeon: Runell Gess, MD;  Location: [facility] INVASIVE CV LAB;  Service: Cardiovascular;  Laterality: N/A;  SVG-PDA   EYE SURGERY     right eye-  muscular repair   LEFT HEART CATH AND CORS/GRAFTS ANGIOGRAPHY N/A 09/28/2021   Procedure: LEFT HEART CATH AND CORS/GRAFTS ANGIOGRAPHY;  Surgeon: Runell Gess, MD;  Location: [facility] INVASIVE CV LAB;  Service: Cardiovascular;  Laterality: N/A;   TOTAL HIP ARTHROPLASTY  01/03/2012   Procedure: TOTAL HIP ARTHROPLASTY;  Surgeon: Jacki Cones, MD;  Location: [facility] ORS;  Service: Orthopedics;  Laterality: Right;    Current Outpatient Medications  Medication Sig Dispense Refill   ALPRAZolam (XANAX) 0.5 MG tablet Take 0.5 mg by mouth at bedtime. Takes at 11:00pm     amLODipine (NORVASC) 2.5 MG tablet Take 2.5 mg by mouth daily. Takes at 6:30am     calcium carbonate (OS-CAL) 600 MG TABS tablet Take 600 mg by mouth daily with breakfast.     carvedilol (COREG) 25 MG tablet Take 0.5 tablets (12.5 mg total) by mouth 2 (two) times daily. (Patient taking differently: Take 12.5  mg by mouth 2 (two) times daily. Takes at 6:30am and 5:30pm)     Cholecalciferol (VITAMIN D3 PO) Taking 250 mcg by mouth daily Takes at 6:30am     clopidogrel (PLAVIX) 75 MG tablet Take 1 tablet (75 mg total) by mouth daily with breakfast. Take every day for 1 year (Patient taking differently: Take 75 mg by mouth daily with breakfast. Takes at 4am) 90 tablet 3   Coenzyme Q10 (CO Q 10 PO) Take 400 mg by mouth daily. Takes at 6:30am     Evolocumab (REPATHA SURECLICK) 140 MG/ML SOAJ INJECT 140 MG INTO THE SKIN EVERY 14 (FOURTEEN) DAYS. INJECT 1 PEN INTO THE FATTY TISSUE SKIN EVERY 14 DAY (Patient taking differently: Inject 140 mg into the skin every 14 (fourteen) days.) 6 mL 3   famotidine (PEPCID) 20 MG tablet Take 1 tablet (20 mg total) by mouth 2 (two) times daily. (Patient taking differently: Take 20 mg by  mouth 2 (two) times daily. Takes at 6:30am and 5:30pm)     ferrous sulfate 325 (65 FE) MG EC tablet Take 325 mg by mouth at bedtime. Takes at 5:30pm     furosemide (LASIX) 40 MG tablet Take 1 tablet (40 mg total) by mouth daily as needed. (Patient taking differently: Take 40 mg by mouth daily as needed. ONLY as needed) 30 tablet 3   levothyroxine (SYNTHROID, LEVOTHROID) 137 MCG tablet Take 137 mcg by mouth daily before breakfast. Takes at 4 am     loratadine (CLARITIN) 10 MG tablet Take 10 mg by mouth daily. Takes at 6:30am     metFORMIN (GLUCOPHAGE) 500 MG tablet Take 500 mg by mouth 2 (two) times daily. Takes at 6:30am and 5:30pm  12   Multiple Vitamin (MULTIVITAMIN WITH MINERALS) TABS tablet Take 1 tablet by mouth daily. Centrum silver men Takes at 6:30am     nitroGLYCERIN (NITROSTAT) 0.4 MG SL tablet Place 1 tablet (0.4 mg total) under the tongue every 5 (five) minutes x 3 doses as needed for chest pain. 25 tablet 12   pantoprazole (PROTONIX) 40 MG tablet Take 40 mg by mouth 2 (two) times daily. Takes at 6:30am and 5:30pm     predniSONE (DELTASONE) 5 MG tablet Take 5 mg by mouth daily with breakfast. Takes at 6:30am     riTUXimab (RITUXAN IV) Inject 1 application  into the vein See admin instructions. Take every two weeks and every six months alternating     testosterone cypionate (DEPOTESTOTERONE CYPIONATE) 100 MG/ML injection Inject 100 mg into the muscle every 14 (fourteen) days.     venlafaxine XR (EFFEXOR-XR) 150 MG 24 hr capsule Take 150 mg by mouth daily. Takes at 6:30am     warfarin (COUMADIN) 5 MG tablet TAKE 1/2 TABLET TO 1 TABLET BY MOUTH DAILY AS DIRECTED BY COUMADIN CLINIC (Patient taking differently: TAKE 1/2 TABLET TO 1 TABLET BY MOUTH DAILY AS DIRECTED BY COUMADIN CLINIC 5mg - Takes on Sunday, Tuesday, Thursday and Saturday 2.5mg - Takes on Monday, Wednesday and Friday) 90 tablet 0   Zinc Chelated 22.5 MG TABS Take 22.5 mg by mouth daily. Takes at 6:30am     dofetilide (TIKOSYN)  500 MCG capsule Take 1 capsule (500 mcg total) by mouth 2 (two) times daily. 60 capsule 3   potassium chloride SA (KLOR-CON M) 20 MEQ tablet Take 2 tablets (40 mEq total) by mouth daily. 60 tablet 3   No current facility-administered medications for this encounter.    Allergies  Allergen Reactions   Marcelline Deist [Dapagliflozin]  Lightheaded and low blood pressure    Statins Other (See Comments)    Muscle pain    Social History   Socioeconomic History   Marital status: Married    Spouse name: Not on file   Number of children: Not on file   Years of education: Not on file   Highest education level: Not on file  Occupational History   Not on file  Tobacco Use   Smoking status: Former    Types: Cigarettes    Quit date: 12/24/1983    Years since quitting: 38.5   Smokeless tobacco: Never  Vaping Use   Vaping Use: Never used  Substance and Sexual Activity   Alcohol use: No    Alcohol/week: 0.0 standard drinks of alcohol   Drug use: No   Sexual activity: Not on file  Other Topics Concern   Not on file  Social History Narrative   Not on file   Social Determinants of Health   Financial Resource Strain: Not on file  Food Insecurity: No Food Insecurity (03/02/2022)   Hunger Vital Sign    Worried About Running Out of Food in the Last Year: Never true    Ran Out of Food in the Last Year: Never true  Transportation Needs: No Transportation Needs (03/09/2022)   PRAPARE - Administrator, Civil Service (Medical): No    Lack of Transportation (Non-Medical): No  Physical Activity: Not on file  Stress: Not on file  Social Connections: Not on file  Intimate Partner Violence: Not At Risk (03/02/2022)   Humiliation, Afraid, Rape, and Kick questionnaire    Fear of Current or Ex-Partner: No    Emotionally Abused: No    Physically Abused: No    Sexually Abused: No     ROS- All systems are reviewed and negative except as per the HPI above.  Physical Exam: Vitals:    06/17/22 0825  BP: 126/74  Pulse: (!) 49  Weight: 104.1 kg  Height: 5\' 10"  (1.778 m)   GEN- The patient is well appearing, alert and oriented x 3 today.   Head- normocephalic, atraumatic Eyes-  Sclera clear, conjunctiva pink Ears- hearing intact Lungs- Clear to ausculation bilaterally, normal work of breathing Heart- Regular bradycardic rate and rhythm, no murmurs, rubs or gallops, PMI not laterally displaced Extremities- no clubbing, cyanosis, or edema MS- no significant deformity or atrophy Skin- no rash or lesion Psych- euthymic mood, full affect Neuro- strength and sensation are intact  Wt Readings from Last 3 Encounters:  06/17/22 104.1 kg  06/07/22 103.1 kg  06/07/22 104 kg    EKG today demonstrates  Vent. rate 49 BPM PR interval 180 ms QRS duration 110 ms QT/QTcB 538/485 ms P-R-T axes 73 -34 17 Sinus bradycardia Left axis deviation Minimal voltage criteria for LVH, may be normal variant ( Cornell product ) Nonspecific ST abnormality Prolonged QT Abnormal ECG When compared with ECG of 10-Jun-2022 11:12, PREVIOUS ECG IS PRESENT  Echo 03/02/22 demonstrated    1. Left ventricular ejection fraction, by estimation, is 50%. The left  ventricle has mildly decreased function. The left ventricle demonstrates  global hypokinesis. There is moderate concentric left ventricular  hypertrophy. Left ventricular diastolic  parameters are consistent with Grade II diastolic dysfunction  (pseudonormalization). Elevated left atrial pressure.   2. Right ventricular systolic function is mildly reduced. The right  ventricular size is normal. There is mildly elevated pulmonary artery  systolic pressure. The estimated right ventricular systolic pressure is  35.5 mmHg.  3. Left atrial size was severely dilated.   4. The mitral valve is normal in structure. Trivial mitral valve  regurgitation. No evidence of mitral stenosis. Moderate mitral annular  calcification.   5. The aortic  valve is tricuspid. There is mild calcification of the  aortic valve. There is mild thickening of the aortic valve. Aortic valve  regurgitation is not visualized. Aortic valve sclerosis/calcification is  present, without any evidence of  aortic stenosis.   6. The inferior vena cava is normal in size with greater than 50%  respiratory variability, suggesting right atrial pressure of 3 mmHg.   Comparison(s): No significant change from prior study. Prior images  reviewed side by side.   Heart cath 09/28/21:    Prox LAD to Mid LAD lesion is 100% stenosed.   Ost LAD to Prox LAD lesion is 100% stenosed.   Ramus lesion is 100% stenosed.   Prox RCA lesion is 100% stenosed.   Prox RCA to Dist RCA lesion is 100% stenosed.   Ost Cx to Prox Cx lesion is 30% stenosed.   Dist Graft to Insertion lesion is 99% stenosed.   A drug-eluting stent was successfully placed using a SYNERGY XD [id number].   Post intervention, there is a 0% residual stenosis.  Epic records are reviewed at length today  CHA2DS2-VASc Score = 6  The patient's score is based upon: CHF History: 1 HTN History: 1 Diabetes History: 1 Stroke History: 0 Vascular Disease History: 1 Age Score: 2 Gender Score: 0       ASSESSMENT AND PLAN: Persistent Atrial Fibrillation (ICD10:  I48.0) The patient's CHA2DS2-VASc score is 6, indicating a 9.7% annual risk of stroke.   S/p DCCV on 5/9 with successful conversion. S/p Tikosyn admission 6/3-6/24.  He is currently in SR. Advised to monitor HR at home; he feels asymptomatic. Qtc stable. Continue current Tikosyn 500 mcg BID. Take potassium with food and not on empty stomach. F/u 1 month.   2. Secondary Hypercoagulable State (ICD10:  D68.69) The patient is at significant risk for stroke/thromboembolism based upon his CHA2DS2-VASc Score of 6.  Continue Warfarin (Coumadin).   Continue coumadin and INR checks as directed.   F/u in 1 month for Tikosyn surveillance.   [name],  PA-C Afib [facility] [address] [place], [place] [address] [phone]

## 2022-06-28 ENCOUNTER — Ambulatory Visit (HOSPITAL_BASED_OUTPATIENT_CLINIC_OR_DEPARTMENT_OTHER): Payer: Medicare Other | Admitting: Family

## 2022-06-29 ENCOUNTER — Ambulatory Visit: Payer: Medicare Other | Attending: Cardiology

## 2022-06-29 DIAGNOSIS — Z7901 Long term (current) use of anticoagulants: Secondary | ICD-10-CM | POA: Insufficient documentation

## 2022-06-29 DIAGNOSIS — I480 Paroxysmal atrial fibrillation: Secondary | ICD-10-CM | POA: Diagnosis not present

## 2022-06-29 LAB — POCT INR: INR: 2.5 (ref 2.0–3.0)

## 2022-06-29 NOTE — Patient Instructions (Signed)
Continue taking warfarin 1 tablet daily, except 1/2 tablet Monday, Wednesday and Friday. INR in 6 weeks.  Please call us at [phone] with any questions.

## 2022-06-30 ENCOUNTER — Ambulatory Visit: Payer: Self-pay

## 2022-06-30 NOTE — Patient Instructions (Signed)
Visit Information  Thank you for taking time to visit with me today. Please don't hesitate to contact me if I can be of assistance to you.   Following are the goals we discussed today:   Goals Addressed             This Visit's Progress    To discuss my lung status with my PCP       Care Coordination Interventions: Evaluation of current treatment plan related to dyspnea and patient's adherence to plan as established by provider Determined patient did not receive his most recent chest xray results following the 06/01/22 chest xray he completed per Dr. Thornell Mule Discussed patient has questions about his lung status and would like to know if his dyspnea will improve Encouraged patient to contact his PCP to review his chest xray results and consider asking for a Pulmonology referral and patient is agreeable         To have evaluation for treatment of A-fib       Care Coordination Interventions: Evaluation of current treatment plan related to persistent Atrial Fib  and patient's adherence to plan as established by provider Reviewed and discussed with patient his recent admission for start of Tikosyn to treat his Afib Review of patient status, including review of consultant's reports, relevant laboratory and other test results, and medications completed Determined patient verbalizes having a good understanding of his prescribed treatment plan  Afib action plan reviewed Reviewed next upcoming scheduled follow up with [name] PA for Afib follow up set for 07/14/22 @10  AM         Our next appointment is by telephone on 08/30/22 at 1:00 PM  Please call the care guide team at [phone] if you need to cancel or reschedule your appointment.   If you are experiencing a Mental Health or Behavioral Health Crisis or need someone to talk to, please call 1-800-273-TALK (toll free, 24 hour hotline)  Patient verbalizes understanding of instructions and care plan provided today and agrees to  view in MyChart. Active MyChart status and patient understanding of how to access instructions and care plan via MyChart confirmed with patient.     Delsa Sale, RN, BSN, CCM Care Management Coordinator [facility] Care Management  Direct Phone: [phone]

## 2022-06-30 NOTE — Patient Outreach (Signed)
  Care Coordination   Follow Up Visit Note   06/30/2022 Name: Gary [name] MRN: [id number] DOB: March 28, 1945  Gary [name] is a 77 y.o. year old male who sees Gary [name], [place] for primary care. I spoke with  Gary [name] by phone today.  What matters to the patients health and wellness today?  Patient would like to learn more about his lung status.     Goals Addressed             This Visit's Progress    To discuss my lung status with my PCP       Care Coordination Interventions: Evaluation of current treatment plan related to dyspnea and patient's adherence to plan as established by provider Determined patient did not receive his most recent chest xray results following the 06/01/22 chest xray he completed per Dr. Thornell Mule Discussed patient has questions about his lung status and would like to know if his dyspnea will improve Encouraged patient to contact his PCP to review his chest xray results and consider asking for a Pulmonology referral and patient is agreeable       To have evaluation for treatment of A-fib       Care Coordination Interventions: Evaluation of current treatment plan related to persistent Atrial Fib  and patient's adherence to plan as established by provider Reviewed and discussed with patient his recent admission for start of Tikosyn to treat his Afib Review of patient status, including review of consultant's reports, relevant laboratory and other test results, and medications completed Determined patient verbalizes having a good understanding of his prescribed treatment plan  Afib action plan reviewed Reviewed next upcoming scheduled follow up with [name] PA for Afib follow up set for 07/14/22 @10  AM    Interventions Today    Flowsheet Row Most Recent Value  Chronic Disease   Chronic disease during today's visit Atrial Fibrillation (AFib), Other  [ILD]  General Interventions   General Interventions Discussed/Reviewed General  Interventions Discussed, General Interventions Reviewed, Doctor Visits  Doctor Visits Discussed/Reviewed Doctor Visits Reviewed, Doctor Visits Discussed, Specialist  Education Interventions   Education Provided Provided Education  Provided Verbal Education On When to see the doctor, Medication, Other  [ILD]  Pharmacy Interventions   Pharmacy Dicussed/Reviewed Pharmacy Topics Discussed, Pharmacy Topics Reviewed, Medications and their functions          SDOH assessments and interventions completed:  No     Care Coordination Interventions:  Yes, provided   Follow up plan: Follow up call scheduled for 08/30/22 @1 :00 PM    Encounter Outcome:  Pt. Visit Completed

## 2022-07-10 ENCOUNTER — Other Ambulatory Visit (HOSPITAL_COMMUNITY): Payer: Self-pay | Admitting: Internal Medicine

## 2022-07-14 ENCOUNTER — Encounter (HOSPITAL_COMMUNITY): Payer: Self-pay | Admitting: Internal Medicine

## 2022-07-14 ENCOUNTER — Ambulatory Visit (HOSPITAL_COMMUNITY)
Admission: RE | Admit: 2022-07-14 | Discharge: 2022-07-14 | Disposition: A | Discharge status: Home / Self Care | Payer: Medicare Other | Source: Ambulatory Visit | Attending: Internal Medicine | Admitting: Internal Medicine

## 2022-07-14 VITALS — BP 124/72 | HR 52 | Ht 70.0 in | Wt 227.4 lb

## 2022-07-14 DIAGNOSIS — E785 Hyperlipidemia, unspecified: Secondary | ICD-10-CM | POA: Insufficient documentation

## 2022-07-14 DIAGNOSIS — Z5181 Encounter for therapeutic drug level monitoring: Secondary | ICD-10-CM

## 2022-07-14 DIAGNOSIS — Z951 Presence of aortocoronary bypass graft: Secondary | ICD-10-CM | POA: Insufficient documentation

## 2022-07-14 DIAGNOSIS — I5042 Chronic combined systolic (congestive) and diastolic (congestive) heart failure: Secondary | ICD-10-CM | POA: Diagnosis not present

## 2022-07-14 DIAGNOSIS — I4819 Other persistent atrial fibrillation: Secondary | ICD-10-CM | POA: Diagnosis not present

## 2022-07-14 DIAGNOSIS — I2510 Atherosclerotic heart disease of native coronary artery without angina pectoris: Secondary | ICD-10-CM | POA: Diagnosis not present

## 2022-07-14 DIAGNOSIS — G4733 Obstructive sleep apnea (adult) (pediatric): Secondary | ICD-10-CM | POA: Diagnosis not present

## 2022-07-14 DIAGNOSIS — I714 Abdominal aortic aneurysm, without rupture, unspecified: Secondary | ICD-10-CM | POA: Insufficient documentation

## 2022-07-14 DIAGNOSIS — Z79899 Other long term (current) drug therapy: Secondary | ICD-10-CM | POA: Insufficient documentation

## 2022-07-14 DIAGNOSIS — Z7901 Long term (current) use of anticoagulants: Secondary | ICD-10-CM | POA: Diagnosis not present

## 2022-07-14 DIAGNOSIS — D6869 Other thrombophilia: Secondary | ICD-10-CM | POA: Diagnosis not present

## 2022-07-14 DIAGNOSIS — I110 Hypertensive heart disease with heart failure: Secondary | ICD-10-CM | POA: Diagnosis not present

## 2022-07-14 DIAGNOSIS — Z955 Presence of coronary angioplasty implant and graft: Secondary | ICD-10-CM | POA: Diagnosis not present

## 2022-07-14 LAB — BASIC METABOLIC PANEL
Anion gap: 7 (ref 5–15)
BUN: 18 mg/dL (ref 8–23)
CO2: 29 mmol/L (ref 22–32)
Calcium: 9.5 mg/dL (ref 8.9–10.3)
Chloride: 104 mmol/L (ref 98–111)
Creatinine, Ser: 0.88 mg/dL (ref 0.61–1.24)
GFR, Estimated: >60 mL/min (ref >60)
Glucose, Bld: 109 mg/dL — ABNORMAL HIGH (ref 70–99)
Potassium: 4.7 mmol/L (ref 3.5–5.1)
Sodium: 140 mmol/L (ref 135–145)

## 2022-07-14 LAB — MAGNESIUM: Magnesium: 2 mg/dL (ref 1.7–2.4)

## 2022-07-14 NOTE — Progress Notes (Signed)
Primary Care Physician: Charlane Ferretti, DO Primary Cardiologist: Dr. Duke Salvia Primary Electrophysiologist: N/A Referring Physician: Dr. Alinda Deem Gary [name] is a 77 y.o. male with a history of hypertension, melanoma, CAD s/p PCI and CABG, mild ascending aortic aneurysm, chronic systolic and diastolic heart failure, hyperlipidemia, paroxysmal atrial fibrillation, and OSA non-compliant with CPAP who presents for consultation in the [facility]. Patient is on coumadin for a CHADS2VASC score of 6.  He underwent scheduled cardioversion on 02/26/22, for afib that was found in January. CV was successful in converting to sinus rhythm on the second shock .Unfortunately, he was admitted from 2/26-3/2 for aspiration pneumonia possibly secondary to  intubation/ DCCV as pt was on Ozempic, which was not stopped and he did not want to delay CV. He appeared to have been in sinus rhythm during admission. He is currently on carvedilol and warfarin. No bleeding concerns. He does state was on eliquis a couple years ago but switched to coumadin due to cost while in the donut hole. He is in rate controlled afib today and was not aware.   He cannot specifically tell me when he went back into Afib since hospital discharge. He admits to overall feeling low energy but it is not new and has felt this way for a while. He definitely feels better than he did 2 weeks ago. Has had Afib for about 4 years intermittently; required cardioversion about a year or so ago. Denies shortness of breath or palpitations. He is now compliant with his CPAP.   On follow up 05/11/22, he is currently in rate controlled Afib. He is here today for Tikosyn admission after plan confirmed at OV with Dr. Elberta Fortis on 04/12/22. Medication list reviewed by pharmacy and no contraindications noted. He has completed 4 therapeutic INR checks being on coumadin. He has not taken benadryl in the last week. He does note some bilateral  ankle swelling since stopping aldactone.  On follow up 05/24/22, he is currently in NSR. S/p successful DCCV on 5/9 to NSR. He was originally scheduled for Tikosyn admission but this was canceled unfortunately due to being on bactrim. This medication was added to his list after pharmacy review. Seen by rheumatologist Dr. Dierdre Forth on 5/15 and bactrim was discontinued; placed on prednisone 5 mg daily. He has continued INR checks as directed and none have been below 2.0; most recent INR on 5/17 was 3.4.   On follow up 06/07/22, he is currently in NSR. He is here today for Tikosyn admission. He is currently on prednisone daily instead of bactrim. He has completed 4 therapeutic INR checks. He has not begun any new medications since last office visit. He has taken all his morning medications as directed.  On follow up 06/17/22, he is s/p Tikosyn admission 6/3-6/24. He did not require DCCV during stay. He was discharged on 500 mcg BID dosage. He is doing well since discharge and has not had any episodes of Afib. He has remained compliant on Tikosyn 500 mcg BID dosing. Potassium supplementation increased. He has noted excess flatulence since discharge.   On follow up 07/14/22, he is currently in NSR. Patient is here for 1 month Tikosyn surveillance. He has been doing well since last OV and reports no episodes of Afib. No missed doses of Tikosyn 500 mcg. He is compliant with INR checks; recent INR 2.5 on 06/29/22.   Today, he denies symptoms of palpitations, chest pain, shortness of breath, orthopnea, PND, lower extremity edema, dizziness,  presyncope, syncope, snoring, daytime somnolence, bleeding, or neurologic sequela. The patient is tolerating medications without difficulties and is otherwise without complaint today.    Atrial Fibrillation Risk Factors:  he does have symptoms or diagnosis of sleep apnea. he is not compliant with CPAP therapy. he does not have a history of rheumatic fever. he does not have a  history of alcohol use. The patient does not have a history of early familial atrial fibrillation or other arrhythmias.  he has a BMI of Body mass index is 32.63 kg/m.Marland Kitchen Filed Weights   07/14/22 0953  Weight: 103.1 kg     Atrial Fibrillation Management history:  Previous antiarrhythmic drugs: Tikosyn 500 mcg BID Previous cardioversions: 02/26/22 Previous ablations: None CHADS2VASC score: 6 Anticoagulation history: Coumadin   Past Medical History:  Diagnosis Date   Aortic aneurysm (HCC) 02/20/2016   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   Arthritis    Atrial fibrillation (HCC) 11/13/2014   Cancer (HCC)    skin   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/2016   LVEF 45-50% 11/2014   Coronary artery disease    Depression    Essential hypertension 11/13/2014   Hyperlipidemia    Hyperlipidemia 11/13/2014   Hypertension    Sleep apnea    CPAP 'Uses half the time"  last study 2009   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   Past Surgical History:  Procedure Laterality Date   CARDIOVERSION N/A 12/06/2014   Procedure: CARDIOVERSION;  Surgeon: Chilton Si, MD;  Location: [facility] ENDOSCOPY;  Service: Cardiovascular;  Laterality: N/A;   CARDIOVERSION N/A 05/26/2017   Procedure: CARDIOVERSION;  Surgeon: Chilton Si, MD;  Location: [facility] ENDOSCOPY;  Service: Cardiovascular;  Laterality: N/A;   CARDIOVERSION N/A 04/25/2019   Procedure: CARDIOVERSION;  Surgeon: Thurmon Fair, MD;  Location: [facility] ENDOSCOPY;  Service: Cardiovascular;  Laterality: N/A;   CARDIOVERSION N/A 02/26/2022   Procedure: CARDIOVERSION;  Surgeon: Chilton Si, MD;  Location: [facility] ENDOSCOPY;  Service: Cardiovascular;  Laterality: N/A;   CARDIOVERSION N/A 05/13/2022   Procedure: CARDIOVERSION;  Surgeon: Quintella Reichert, MD;  Location: [facility] INVASIVE CV LAB;  Service: Cardiovascular;  Laterality: N/A;   CARPAL TUNNEL RELEASE     CORONARY ANGIOPLASTY     multiple stents   CORONARY ARTERY BYPASS GRAFT   2006   CORONARY STENT INTERVENTION N/A 09/28/2021   Procedure: CORONARY STENT INTERVENTION;  Surgeon: Runell Gess, MD;  Location: [facility] INVASIVE CV LAB;  Service: Cardiovascular;  Laterality: N/A;  SVG-PDA   EYE SURGERY     right eye-  muscular repair   LEFT HEART CATH AND CORS/GRAFTS ANGIOGRAPHY N/A 09/28/2021   Procedure: LEFT HEART CATH AND CORS/GRAFTS ANGIOGRAPHY;  Surgeon: Runell Gess, MD;  Location: [facility] INVASIVE CV LAB;  Service: Cardiovascular;  Laterality: N/A;   TOTAL HIP ARTHROPLASTY  01/03/2012   Procedure: TOTAL HIP ARTHROPLASTY;  Surgeon: Jacki Cones, MD;  Location: [facility] ORS;  Service: Orthopedics;  Laterality: Right;    Current Outpatient Medications  Medication Sig Dispense Refill   ALPRAZolam (XANAX) 0.5 MG tablet Take 0.5 mg by mouth at bedtime. Takes at 11:00pm     amLODipine (NORVASC) 2.5 MG tablet Take 2.5 mg by mouth daily. Takes at 6:30am     calcium carbonate (OS-CAL) 600 MG TABS tablet Take 600 mg by mouth daily with breakfast.     carvedilol (COREG) 25 MG tablet Take 0.5 tablets (12.5 mg total) by mouth 2 (two) times daily. (Patient taking differently: Take 12.5 mg by mouth 2 (two) times  daily. Takes at 6:30am and 5:30pm)     Cholecalciferol (VITAMIN D3 PO) Taking 250 mcg by mouth daily Takes at 6:30am     clopidogrel (PLAVIX) 75 MG tablet Take 1 tablet (75 mg total) by mouth daily with breakfast. Take every day for 1 year (Patient taking differently: Take 75 mg by mouth daily with breakfast. Takes at 4am) 90 tablet 3   Coenzyme Q10 (CO Q 10 PO) Take 400 mg by mouth daily. Takes at 6:30am     dofetilide (TIKOSYN) 500 MCG capsule Take 1 capsule (500 mcg total) by mouth 2 (two) times daily. 60 capsule 3   Evolocumab (REPATHA SURECLICK) 140 MG/ML SOAJ INJECT 140 MG INTO THE SKIN EVERY 14 (FOURTEEN) DAYS. INJECT 1 PEN INTO THE FATTY TISSUE SKIN EVERY 14 DAY (Patient taking differently: Inject 140 mg into the skin every 14 (fourteen) days.) 6 mL 3   famotidine  (PEPCID) 20 MG tablet Take 1 tablet (20 mg total) by mouth 2 (two) times daily. (Patient taking differently: Take 20 mg by mouth 2 (two) times daily. Takes at 6:30am and 5:30pm)     ferrous sulfate 325 (65 FE) MG EC tablet Take 325 mg by mouth at bedtime. Takes at 5:30pm     furosemide (LASIX) 40 MG tablet Take 1 tablet (40 mg total) by mouth daily as needed. (Patient taking differently: Take 40 mg by mouth daily as needed. ONLY as needed) 30 tablet 3   KLOR-CON M20 20 MEQ tablet TAKE 2 TABLETS BY MOUTH DAILY 180 tablet 1   levothyroxine (SYNTHROID, LEVOTHROID) 137 MCG tablet Take 137 mcg by mouth daily before breakfast. Takes at 4 am     loratadine (CLARITIN) 10 MG tablet Take 10 mg by mouth daily. Takes at 6:30am     metFORMIN (GLUCOPHAGE) 500 MG tablet Take 500 mg by mouth 2 (two) times daily. Takes at 6:30am and 5:30pm  12   Multiple Vitamin (MULTIVITAMIN WITH MINERALS) TABS tablet Take 1 tablet by mouth daily. Centrum silver men Takes at 6:30am     nitroGLYCERIN (NITROSTAT) 0.4 MG SL tablet Place 1 tablet (0.4 mg total) under the tongue every 5 (five) minutes x 3 doses as needed for chest pain. 25 tablet 12   pantoprazole (PROTONIX) 40 MG tablet Take 40 mg by mouth 2 (two) times daily. Takes at 6:30am and 5:30pm     predniSONE (DELTASONE) 5 MG tablet Take 5 mg by mouth daily with breakfast. Takes at 6:30am     riTUXimab (RITUXAN IV) Inject 1 application  into the vein See admin instructions. Take every two weeks and every six months alternating     testosterone cypionate (DEPOTESTOTERONE CYPIONATE) 100 MG/ML injection Inject 100 mg into the muscle every 14 (fourteen) days.     venlafaxine XR (EFFEXOR-XR) 150 MG 24 hr capsule Take 150 mg by mouth daily. Takes at 6:30am     warfarin (COUMADIN) 5 MG tablet TAKE 1/2 TABLET TO 1 TABLET BY MOUTH DAILY AS DIRECTED BY COUMADIN CLINIC (Patient taking differently: TAKE 1/2 TABLET TO 1 TABLET BY MOUTH DAILY AS DIRECTED BY COUMADIN CLINIC 5mg - Takes on  Sunday, Tuesday, Thursday and Saturday 2.5mg - Takes on Monday, Wednesday and Friday) 90 tablet 0   Zinc Chelated 22.5 MG TABS Take 22.5 mg by mouth daily. Takes at 6:30am     No current facility-administered medications for this encounter.    Allergies  Allergen Reactions   Farxiga [Dapagliflozin]     Lightheaded and low blood pressure  Statins Other (See Comments)    Muscle pain    ROS- All systems are reviewed and negative except as per the HPI above.  Physical Exam: Vitals:   07/14/22 0953  BP: 124/72  Pulse: (!) 52  Weight: 103.1 kg  Height: 5\' 10"  (1.778 m)    GEN- The patient is well appearing, alert and oriented x 3 today.   Neck - no JVD or carotid bruit noted Lungs- Clear to ausculation bilaterally, normal work of breathing Heart- Regular rate and rhythm, no murmurs, rubs or gallops, PMI not laterally displaced Extremities- no clubbing, cyanosis, or edema Skin - no rash or ecchymosis noted   Wt Readings from Last 3 Encounters:  07/14/22 103.1 kg  06/17/22 104.1 kg  06/07/22 103.1 kg    EKG today demonstrates  Vent. rate 52 BPM PR interval 182 ms QRS duration 110 ms QT/QTcB 518/481 ms P-R-T axes 62 -42 51 Sinus bradycardia Left axis deviation Moderate voltage criteria for LVH, may be normal variant ( R in aVL , Cornell product ) Nonspecific ST abnormality Prolonged QT Abnormal ECG When compared with ECG of 17-Jun-2022 08:33, PREVIOUS ECG IS PRESENT  Echo 03/02/22 demonstrated    1. Left ventricular ejection fraction, by estimation, is 50%. The left  ventricle has mildly decreased function. The left ventricle demonstrates  global hypokinesis. There is moderate concentric left ventricular  hypertrophy. Left ventricular diastolic  parameters are consistent with Grade II diastolic dysfunction  (pseudonormalization). Elevated left atrial pressure.   2. Right ventricular systolic function is mildly reduced. The right  ventricular size is normal.  There is mildly elevated pulmonary artery  systolic pressure. The estimated right ventricular systolic pressure is  35.5 mmHg.   3. Left atrial size was severely dilated.   4. The mitral valve is normal in structure. Trivial mitral valve  regurgitation. No evidence of mitral stenosis. Moderate mitral annular  calcification.   5. The aortic valve is tricuspid. There is mild calcification of the  aortic valve. There is mild thickening of the aortic valve. Aortic valve  regurgitation is not visualized. Aortic valve sclerosis/calcification is  present, without any evidence of  aortic stenosis.   6. The inferior vena cava is normal in size with greater than 50%  respiratory variability, suggesting right atrial pressure of 3 mmHg.   Comparison(s): No significant change from prior study. Prior images  reviewed side by side.   Heart cath 09/28/21:    Prox LAD to Mid LAD lesion is 100% stenosed.   Ost LAD to Prox LAD lesion is 100% stenosed.   Ramus lesion is 100% stenosed.   Prox RCA lesion is 100% stenosed.   Prox RCA to Dist RCA lesion is 100% stenosed.   Ost Cx to Prox Cx lesion is 30% stenosed.   Dist Graft to Insertion lesion is 99% stenosed.   A drug-eluting stent was successfully placed using a SYNERGY XD [id number].   Post intervention, there is a 0% residual stenosis.  Epic records are reviewed at length today  CHA2DS2-VASc Score = 6  The patient's score is based upon: CHF History: 1 HTN History: 1 Diabetes History: 1 Stroke History: 0 Vascular Disease History: 1 Age Score: 2 Gender Score: 0      ASSESSMENT AND PLAN: Persistent Atrial Fibrillation (ICD10:  I48.0) The patient's CHA2DS2-VASc score is 6, indicating a 9.7% annual risk of stroke.   S/p DCCV on 5/9 with successful conversion. S/p Tikosyn admission 6/3-6/24.  He is currently in SR. Advised to  monitor HR at home; he feels asymptomatic. Qtc stable. Continue current Tikosyn 500 mcg BID. Bmet and mag drawn  today. Take potassium with food and not on empty stomach. F/u 1 month.   2. Secondary Hypercoagulable State (ICD10:  D68.69) The patient is at significant risk for stroke/thromboembolism based upon his CHA2DS2-VASc Score of 6.  Continue Warfarin (Coumadin).   Continue coumadin and INR checks as directed.   F/u in 3 months for Tikosyn surveillance.   [name], PA-C Af[facility] [address] [place], [place] [address] [phone]

## 2022-07-27 DIAGNOSIS — M3130 Wegener's granulomatosis without renal involvement: Secondary | ICD-10-CM | POA: Diagnosis not present

## 2022-07-27 DIAGNOSIS — I4891 Unspecified atrial fibrillation: Secondary | ICD-10-CM | POA: Diagnosis not present

## 2022-07-27 DIAGNOSIS — G4733 Obstructive sleep apnea (adult) (pediatric): Secondary | ICD-10-CM | POA: Diagnosis not present

## 2022-08-09 ENCOUNTER — Other Ambulatory Visit: Payer: Self-pay | Admitting: Cardiovascular Disease

## 2022-08-09 DIAGNOSIS — Z7901 Long term (current) use of anticoagulants: Secondary | ICD-10-CM

## 2022-08-09 DIAGNOSIS — I480 Paroxysmal atrial fibrillation: Secondary | ICD-10-CM

## 2022-08-09 NOTE — Telephone Encounter (Signed)
Please review for refill. Thank you! 

## 2022-08-10 ENCOUNTER — Ambulatory Visit: Payer: Medicare Other

## 2022-08-11 ENCOUNTER — Ambulatory Visit: Payer: Medicare Other | Attending: Cardiology | Admitting: *Deleted

## 2022-08-11 DIAGNOSIS — Z7901 Long term (current) use of anticoagulants: Secondary | ICD-10-CM

## 2022-08-11 DIAGNOSIS — I480 Paroxysmal atrial fibrillation: Secondary | ICD-10-CM

## 2022-08-11 LAB — POCT INR: INR: 2 (ref 2.0–3.0)

## 2022-08-11 NOTE — Patient Instructions (Signed)
Description   Today take 1 tablet of warfarin then continue taking warfarin 1 tablet daily, except 1/2 tablet Monday, Wednesday and Friday. INR in 6 weeks.  Please call us at [phone] with any questions.

## 2022-08-26 DIAGNOSIS — E039 Hypothyroidism, unspecified: Secondary | ICD-10-CM | POA: Diagnosis not present

## 2022-08-26 DIAGNOSIS — D500 Iron deficiency anemia secondary to blood loss (chronic): Secondary | ICD-10-CM | POA: Diagnosis not present

## 2022-08-26 DIAGNOSIS — E785 Hyperlipidemia, unspecified: Secondary | ICD-10-CM | POA: Diagnosis not present

## 2022-08-26 DIAGNOSIS — I110 Hypertensive heart disease with heart failure: Secondary | ICD-10-CM | POA: Diagnosis not present

## 2022-08-26 DIAGNOSIS — E291 Testicular hypofunction: Secondary | ICD-10-CM | POA: Diagnosis not present

## 2022-08-26 DIAGNOSIS — E611 Iron deficiency: Secondary | ICD-10-CM | POA: Diagnosis not present

## 2022-08-26 DIAGNOSIS — E1169 Type 2 diabetes mellitus with other specified complication: Secondary | ICD-10-CM | POA: Diagnosis not present

## 2022-08-26 DIAGNOSIS — I5022 Chronic systolic (congestive) heart failure: Secondary | ICD-10-CM | POA: Diagnosis not present

## 2022-08-26 DIAGNOSIS — Z125 Encounter for screening for malignant neoplasm of prostate: Secondary | ICD-10-CM | POA: Diagnosis not present

## 2022-08-26 DIAGNOSIS — D649 Anemia, unspecified: Secondary | ICD-10-CM | POA: Diagnosis not present

## 2022-08-30 ENCOUNTER — Telehealth: Payer: Self-pay | Admitting: Cardiovascular Disease

## 2022-08-30 ENCOUNTER — Ambulatory Visit: Payer: Self-pay

## 2022-08-30 NOTE — Telephone Encounter (Signed)
Pt would like a callback from nurses regarding resent labs that were done at another office. Pt states that results says that he has heart failure. Please advise

## 2022-08-30 NOTE — Telephone Encounter (Addendum)
Returned call to patient,   Gary [name]- done on 8/22, BNP came back Friday afternoon at 1951. He states that Dr. Duke Salvia put him on a diuretic, but when he went on tikosyn he was taken off of it. He does have one to take as needed. He states he doesn't know when it is needed. He states he did take one this morning, because his ankles are very puffy. Has been very short of breath the last 3 days.   Patient will have labs faxed over for review.

## 2022-08-31 DIAGNOSIS — Z6832 Body mass index (BMI) 32.0-32.9, adult: Secondary | ICD-10-CM | POA: Diagnosis not present

## 2022-08-31 DIAGNOSIS — M1991 Primary osteoarthritis, unspecified site: Secondary | ICD-10-CM | POA: Diagnosis not present

## 2022-08-31 DIAGNOSIS — R0600 Dyspnea, unspecified: Secondary | ICD-10-CM | POA: Diagnosis not present

## 2022-08-31 DIAGNOSIS — M25562 Pain in left knee: Secondary | ICD-10-CM | POA: Diagnosis not present

## 2022-08-31 DIAGNOSIS — Z7952 Long term (current) use of systemic steroids: Secondary | ICD-10-CM | POA: Diagnosis not present

## 2022-08-31 DIAGNOSIS — R0609 Other forms of dyspnea: Secondary | ICD-10-CM | POA: Diagnosis not present

## 2022-08-31 DIAGNOSIS — Z111 Encounter for screening for respiratory tuberculosis: Secondary | ICD-10-CM | POA: Diagnosis not present

## 2022-08-31 DIAGNOSIS — M3130 Wegener's granulomatosis without renal involvement: Secondary | ICD-10-CM | POA: Diagnosis not present

## 2022-08-31 DIAGNOSIS — M25561 Pain in right knee: Secondary | ICD-10-CM | POA: Diagnosis not present

## 2022-08-31 DIAGNOSIS — R5383 Other fatigue: Secondary | ICD-10-CM | POA: Diagnosis not present

## 2022-08-31 DIAGNOSIS — M0579 Rheumatoid arthritis with rheumatoid factor of multiple sites without organ or systems involvement: Secondary | ICD-10-CM | POA: Diagnosis not present

## 2022-08-31 DIAGNOSIS — R768 Other specified abnormal immunological findings in serum: Secondary | ICD-10-CM | POA: Diagnosis not present

## 2022-08-31 DIAGNOSIS — E669 Obesity, unspecified: Secondary | ICD-10-CM | POA: Diagnosis not present

## 2022-08-31 NOTE — Patient Outreach (Signed)
  Care Coordination   Follow Up Visit Note   08/31/2022 Name: Gary [name] MRN: [id number] DOB: Jul 30, 1945  Gary [name] is a 77 y.o. year old male who sees Gary [name], [place] for primary care. I spoke with  Gary [name] by phone today.  What matters to the patients health and wellness today?  Patient is concerned about having increased shortness of breath, weight gain and edema.     Goals Addressed             This Visit's Progress    To improve lower extremity edema while maintaining a good BP   On track    Care Coordination Interventions: Evaluation of current treatment plan related to hypertension self management and patient's adherence to plan as established by provider Determined patient is experiencing increased shortness of breath, lower extremity edema and increase in weight gain  Basic overview and discussion of pathophysiology of Heart Failure reviewed Provided education on low sodium diet Provided education about placing scale on hard, flat surface Advised patient to weigh each morning after emptying bladder Discussed importance of daily weight and advised patient to weigh and record daily Reviewed role of diuretics in prevention of fluid overload and management of heart failure; Discussed the importance of keeping all appointments with provider Advised patient to discuss worsening symptoms with provider, patient will call his Cardiologist at the end of this call    Interventions Today    Flowsheet Row Most Recent Value  Chronic Disease   Chronic disease during today's visit Congestive Heart Failure (CHF), Atrial Fibrillation (AFib)  General Interventions   General Interventions Discussed/Reviewed General Interventions Discussed, General Interventions Reviewed, Doctor Visits, Labs  Doctor Visits Discussed/Reviewed Doctor Visits Discussed, Doctor Visits Reviewed, PCP, Specialist  Education Interventions   Education Provided Provided Education  Provided  Verbal Education On Nutrition, Labs, Medication, When to see the doctor  Nutrition Interventions   Nutrition Discussed/Reviewed Nutrition Discussed, Nutrition Reviewed, Decreasing salt  Pharmacy Interventions   Pharmacy Dicussed/Reviewed Pharmacy Topics Discussed, Pharmacy Topics Reviewed, Medications and their functions          SDOH assessments and interventions completed:  No     Care Coordination Interventions:  Yes, provided   Follow up plan: Follow up call scheduled for 09/28/22 @12 :30 PM    Encounter Outcome:  Pt. Visit Completed

## 2022-08-31 NOTE — Patient Instructions (Signed)
Visit Information  Thank you for taking time to visit with me today. Please don't hesitate to contact me if I can be of assistance to you.   Following are the goals we discussed today:   Goals Addressed             This Visit's Progress    To improve lower extremity edema while maintaining a good BP   On track    Care Coordination Interventions: Evaluation of current treatment plan related to hypertension self management and patient's adherence to plan as established by provider Determined patient is experiencing increased shortness of breath, lower extremity edema and increase in weight gain  Basic overview and discussion of pathophysiology of Heart Failure reviewed Provided education on low sodium diet Provided education about placing scale on hard, flat surface Advised patient to weigh each morning after emptying bladder Discussed importance of daily weight and advised patient to weigh and record daily Reviewed role of diuretics in prevention of fluid overload and management of heart failure; Discussed the importance of keeping all appointments with provider Advised patient to discuss worsening symptoms with provider, patient will call his Cardiologist at the end of this call        Our next appointment is by telephone on 09/28/22 at 12:30 PM  Please call the care guide team at [phone] if you need to cancel or reschedule your appointment.   If you are experiencing a Mental Health or Behavioral Health Crisis or need someone to talk to, please call 1-800-273-TALK (toll free, 24 hour hotline)  Patient verbalizes understanding of instructions and care plan provided today and agrees to view in MyChart. Active MyChart status and patient understanding of how to access instructions and care plan via MyChart confirmed with patient.     Delsa Sale, RN, BSN, CCM Care Management Coordinator [facility] Care Management Direct Phone: [phone]

## 2022-09-02 DIAGNOSIS — M3130 Wegener's granulomatosis without renal involvement: Secondary | ICD-10-CM | POA: Diagnosis not present

## 2022-09-02 DIAGNOSIS — E1169 Type 2 diabetes mellitus with other specified complication: Secondary | ICD-10-CM | POA: Diagnosis not present

## 2022-09-02 DIAGNOSIS — J849 Interstitial pulmonary disease, unspecified: Secondary | ICD-10-CM | POA: Diagnosis not present

## 2022-09-02 DIAGNOSIS — R351 Nocturia: Secondary | ICD-10-CM | POA: Diagnosis not present

## 2022-09-02 DIAGNOSIS — I719 Aortic aneurysm of unspecified site, without rupture: Secondary | ICD-10-CM | POA: Diagnosis not present

## 2022-09-02 DIAGNOSIS — R82998 Other abnormal findings in urine: Secondary | ICD-10-CM | POA: Diagnosis not present

## 2022-09-02 DIAGNOSIS — F325 Major depressive disorder, single episode, in full remission: Secondary | ICD-10-CM | POA: Diagnosis not present

## 2022-09-02 DIAGNOSIS — Z0000 Encounter for general adult medical examination without abnormal findings: Secondary | ICD-10-CM | POA: Diagnosis not present

## 2022-09-02 DIAGNOSIS — Z23 Encounter for immunization: Secondary | ICD-10-CM | POA: Diagnosis not present

## 2022-09-02 DIAGNOSIS — Z7952 Long term (current) use of systemic steroids: Secondary | ICD-10-CM | POA: Diagnosis not present

## 2022-09-02 DIAGNOSIS — D849 Immunodeficiency, unspecified: Secondary | ICD-10-CM | POA: Diagnosis not present

## 2022-09-02 DIAGNOSIS — Z1331 Encounter for screening for depression: Secondary | ICD-10-CM | POA: Diagnosis not present

## 2022-09-02 DIAGNOSIS — I5022 Chronic systolic (congestive) heart failure: Secondary | ICD-10-CM | POA: Diagnosis not present

## 2022-09-02 DIAGNOSIS — I25118 Atherosclerotic heart disease of native coronary artery with other forms of angina pectoris: Secondary | ICD-10-CM | POA: Diagnosis not present

## 2022-09-02 DIAGNOSIS — D6869 Other thrombophilia: Secondary | ICD-10-CM | POA: Diagnosis not present

## 2022-09-08 ENCOUNTER — Telehealth: Payer: Self-pay

## 2022-09-08 ENCOUNTER — Ambulatory Visit: Payer: Self-pay

## 2022-09-08 NOTE — Patient Instructions (Signed)
Visit Information  Thank you for taking time to visit with me today. Please don't hesitate to contact me if I can be of assistance to you.   Following are the goals we discussed today:   Goals Addressed             This Visit's Progress    To discuss my lung status with my PCP   On track    Care Coordination Interventions: Evaluation of current treatment plan related to dyspnea and patient's adherence to plan as established by provider Reviewed and discussed with patient, PCP referral for a high resolution lung CT Reviewed referral with patient and noted the requested provider is [facility] located on [address] [place], [place], [place] Provided patient with the contact number for this provider and advised patient he may contact them to schedule his CT when ready     To improve lower extremity edema while maintaining a good BP   On track    Care Coordination Interventions: Evaluation of current treatment plan related to hypertension self management and patient's adherence to plan as established by provider Determined patient continues to experience increased shortness of breath, lower extremity edema and increase in weight gain  Re-educated patient re: the role of diuretics in prevention of fluid overload and management of heart failure Determined patient continues to have his oxygen levels drop <88% when over exerting, he is having to take frequent rest breaks with activity, his average O2 sats run 96%, he continues to use Oxygen at nigttime Reviewed and discussed upcoming scheduled Cardiology appointment scheduled for 09/24/22 @1 :30 PM Discussed the importance of keeping all appointments with provider          Our next appointment is by telephone on 09/28/22 at 12:30 PM  Please call the care guide team at [phone] if you need to cancel or reschedule your appointment.   If you are experiencing a Mental Health or Behavioral Health Crisis or need someone to talk to,  please call 1-800-273-TALK (toll free, 24 hour hotline)  Patient verbalizes understanding of instructions and care plan provided today and agrees to view in MyChart. Active MyChart status and patient understanding of how to access instructions and care plan via MyChart confirmed with patient.     Delsa Sale, RN, BSN, CCM Care Management Coordinator [facility] Care Management  Direct Phone: [phone]

## 2022-09-08 NOTE — Telephone Encounter (Signed)
I called and spoke with Gary [name]. I told him that he has never seen our pulmonary office before. He stated that Dr. Charlane Ferretti was putting in a referral for him to see [facility] and do a High Res CT. I let the patient know Dr. Charlane Ferretti only placed the order for the High Res CT and there was no referral for Pulmonary. He stated he had been talking with his office since last Friday 09/03/22. He would call them back to see what was going on

## 2022-09-08 NOTE — Patient Outreach (Signed)
  Care Coordination   Follow Up Visit Note   09/08/2022 Name: Gary [name] MRN: [id number] DOB: [birth year]  Gary [name] is a 77 y.o. year old male who sees Gary [name], [place] for primary care. I spoke with  Gary [name] by phone today.  What matters to the patients health and wellness today?  Patient would like to have his lung and heart conditions re-evaluated.     Goals Addressed             This Visit's Progress    To discuss my lung status with my PCP   On track    Care Coordination Interventions: Evaluation of current treatment plan related to dyspnea and patient's adherence to plan as established by provider Reviewed and discussed with patient, PCP referral for a high resolution lung CT Reviewed referral with patient and noted the requested provider is [facility] located on [address] [place], [place], [place] Provided patient with the contact number for this provider and advised patient he may contact them to schedule his CT when ready     To improve lower extremity edema while maintaining a good BP   On track    Care Coordination Interventions: Evaluation of current treatment plan related to hypertension self management and patient's adherence to plan as established by provider Determined patient continues to experience increased shortness of breath, lower extremity edema and increase in weight gain  Re-educated patient re: the role of diuretics in prevention of fluid overload and management of heart failure Determined patient continues to have his oxygen levels drop <88% when over exerting, he is having to take frequent rest breaks with activity, his average O2 sats run 96%, he continues to use Oxygen at nigttime Reviewed and discussed upcoming scheduled Cardiology appointment scheduled for 09/24/22 @1 :30 PM Discussed the importance of keeping all appointments with provider    Interventions Today    Flowsheet Row Most Recent Value  Chronic Disease    Chronic disease during today's visit Congestive Heart Failure (CHF)  General Interventions   General Interventions Discussed/Reviewed General Interventions Discussed, General Interventions Reviewed, Doctor Visits, Durable Medical Equipment (DME)  Doctor Visits Discussed/Reviewed Doctor Visits Reviewed, Doctor Visits Discussed, Specialist  Durable Medical Equipment (DME) Oxygen  Education Interventions   Education Provided Provided Education  Provided Verbal Education On When to see the doctor, Medication, Exercise  Pharmacy Interventions   Pharmacy Dicussed/Reviewed Pharmacy Topics Discussed, Pharmacy Topics Reviewed, Medications and their functions         SDOH assessments and interventions completed:  No     Care Coordination Interventions:  Yes, provided   Follow up plan: Follow up call scheduled for 09/28/22 @12 :30 PM     Encounter Outcome:  Patient Visit Completed

## 2022-09-08 NOTE — Telephone Encounter (Signed)
Patient is calling wanting to know if he has an MRI scheduled. I do not see the referral in his chart nor do I see an MRI scheduled. Please call and advise [phone] or [phone]

## 2022-09-09 DIAGNOSIS — R5383 Other fatigue: Secondary | ICD-10-CM | POA: Diagnosis not present

## 2022-09-09 DIAGNOSIS — M3130 Wegener's granulomatosis without renal involvement: Secondary | ICD-10-CM | POA: Diagnosis not present

## 2022-09-09 DIAGNOSIS — Z111 Encounter for screening for respiratory tuberculosis: Secondary | ICD-10-CM | POA: Diagnosis not present

## 2022-09-17 ENCOUNTER — Emergency Department (HOSPITAL_BASED_OUTPATIENT_CLINIC_OR_DEPARTMENT_OTHER): Payer: Medicare Other

## 2022-09-17 ENCOUNTER — Other Ambulatory Visit: Payer: Self-pay

## 2022-09-17 ENCOUNTER — Emergency Department (HOSPITAL_BASED_OUTPATIENT_CLINIC_OR_DEPARTMENT_OTHER): Payer: Medicare Other | Admitting: Radiology

## 2022-09-17 ENCOUNTER — Encounter: Payer: Self-pay | Admitting: Internal Medicine

## 2022-09-17 ENCOUNTER — Inpatient Hospital Stay (HOSPITAL_BASED_OUTPATIENT_CLINIC_OR_DEPARTMENT_OTHER)
Admission: EM | Admit: 2022-09-17 | Discharge: 2022-09-22 | DRG: 196 | Disposition: A | Discharge status: Home / Self Care | Payer: Medicare Other | Attending: Internal Medicine | Admitting: Internal Medicine

## 2022-09-17 ENCOUNTER — Encounter (HOSPITAL_BASED_OUTPATIENT_CLINIC_OR_DEPARTMENT_OTHER): Payer: Self-pay | Admitting: Emergency Medicine

## 2022-09-17 DIAGNOSIS — I517 Cardiomegaly: Secondary | ICD-10-CM | POA: Diagnosis not present

## 2022-09-17 DIAGNOSIS — Z7901 Long term (current) use of anticoagulants: Secondary | ICD-10-CM

## 2022-09-17 DIAGNOSIS — R791 Abnormal coagulation profile: Secondary | ICD-10-CM | POA: Diagnosis present

## 2022-09-17 DIAGNOSIS — I482 Chronic atrial fibrillation, unspecified: Secondary | ICD-10-CM | POA: Diagnosis not present

## 2022-09-17 DIAGNOSIS — M3130 Wegener's granulomatosis without renal involvement: Secondary | ICD-10-CM | POA: Diagnosis not present

## 2022-09-17 DIAGNOSIS — E785 Hyperlipidemia, unspecified: Secondary | ICD-10-CM | POA: Diagnosis present

## 2022-09-17 DIAGNOSIS — E876 Hypokalemia: Secondary | ICD-10-CM | POA: Diagnosis present

## 2022-09-17 DIAGNOSIS — I110 Hypertensive heart disease with heart failure: Secondary | ICD-10-CM | POA: Diagnosis present

## 2022-09-17 DIAGNOSIS — Z96641 Presence of right artificial hip joint: Secondary | ICD-10-CM | POA: Diagnosis present

## 2022-09-17 DIAGNOSIS — Z7952 Long term (current) use of systemic steroids: Secondary | ICD-10-CM

## 2022-09-17 DIAGNOSIS — J9621 Acute and chronic respiratory failure with hypoxia: Principal | ICD-10-CM | POA: Diagnosis present

## 2022-09-17 DIAGNOSIS — I2510 Atherosclerotic heart disease of native coronary artery without angina pectoris: Secondary | ICD-10-CM | POA: Diagnosis not present

## 2022-09-17 DIAGNOSIS — E1165 Type 2 diabetes mellitus with hyperglycemia: Secondary | ICD-10-CM | POA: Diagnosis present

## 2022-09-17 DIAGNOSIS — I5042 Chronic combined systolic (congestive) and diastolic (congestive) heart failure: Secondary | ICD-10-CM | POA: Diagnosis not present

## 2022-09-17 DIAGNOSIS — Z85828 Personal history of other malignant neoplasm of skin: Secondary | ICD-10-CM

## 2022-09-17 DIAGNOSIS — Z8249 Family history of ischemic heart disease and other diseases of the circulatory system: Secondary | ICD-10-CM | POA: Diagnosis not present

## 2022-09-17 DIAGNOSIS — Z7984 Long term (current) use of oral hypoglycemic drugs: Secondary | ICD-10-CM

## 2022-09-17 DIAGNOSIS — Z823 Family history of stroke: Secondary | ICD-10-CM | POA: Diagnosis not present

## 2022-09-17 DIAGNOSIS — R918 Other nonspecific abnormal finding of lung field: Secondary | ICD-10-CM | POA: Diagnosis not present

## 2022-09-17 DIAGNOSIS — I7782 Antineutrophilic cytoplasmic antibody (ANCA) vasculitis: Secondary | ICD-10-CM | POA: Diagnosis not present

## 2022-09-17 DIAGNOSIS — Z951 Presence of aortocoronary bypass graft: Secondary | ICD-10-CM | POA: Diagnosis not present

## 2022-09-17 DIAGNOSIS — Z79890 Hormone replacement therapy (postmenopausal): Secondary | ICD-10-CM

## 2022-09-17 DIAGNOSIS — J849 Interstitial pulmonary disease, unspecified: Secondary | ICD-10-CM | POA: Diagnosis present

## 2022-09-17 DIAGNOSIS — I252 Old myocardial infarction: Secondary | ICD-10-CM

## 2022-09-17 DIAGNOSIS — Z8701 Personal history of pneumonia (recurrent): Secondary | ICD-10-CM

## 2022-09-17 DIAGNOSIS — Z6833 Body mass index (BMI) 33.0-33.9, adult: Secondary | ICD-10-CM

## 2022-09-17 DIAGNOSIS — E039 Hypothyroidism, unspecified: Secondary | ICD-10-CM | POA: Diagnosis present

## 2022-09-17 DIAGNOSIS — Z87891 Personal history of nicotine dependence: Secondary | ICD-10-CM

## 2022-09-17 DIAGNOSIS — Z888 Allergy status to other drugs, medicaments and biological substances status: Secondary | ICD-10-CM

## 2022-09-17 DIAGNOSIS — I4891 Unspecified atrial fibrillation: Secondary | ICD-10-CM | POA: Diagnosis present

## 2022-09-17 DIAGNOSIS — G4733 Obstructive sleep apnea (adult) (pediatric): Secondary | ICD-10-CM | POA: Diagnosis not present

## 2022-09-17 DIAGNOSIS — I7121 Aneurysm of the ascending aorta, without rupture: Secondary | ICD-10-CM | POA: Diagnosis present

## 2022-09-17 DIAGNOSIS — J9601 Acute respiratory failure with hypoxia: Secondary | ICD-10-CM | POA: Diagnosis present

## 2022-09-17 DIAGNOSIS — F32A Depression, unspecified: Secondary | ICD-10-CM | POA: Diagnosis present

## 2022-09-17 DIAGNOSIS — Z7902 Long term (current) use of antithrombotics/antiplatelets: Secondary | ICD-10-CM

## 2022-09-17 DIAGNOSIS — Z79899 Other long term (current) drug therapy: Secondary | ICD-10-CM

## 2022-09-17 DIAGNOSIS — J8489 Other specified interstitial pulmonary diseases: Principal | ICD-10-CM | POA: Diagnosis present

## 2022-09-17 DIAGNOSIS — Z1152 Encounter for screening for COVID-19: Secondary | ICD-10-CM

## 2022-09-17 DIAGNOSIS — Z955 Presence of coronary angioplasty implant and graft: Secondary | ICD-10-CM

## 2022-09-17 DIAGNOSIS — R0602 Shortness of breath: Secondary | ICD-10-CM | POA: Diagnosis not present

## 2022-09-17 DIAGNOSIS — J84112 Idiopathic pulmonary fibrosis: Secondary | ICD-10-CM | POA: Diagnosis not present

## 2022-09-17 LAB — PROTIME-INR
INR: 1.8 — ABNORMAL HIGH (ref 0.8–1.2)
Prothrombin Time: 21 s — ABNORMAL HIGH (ref 11.4–15.2)

## 2022-09-17 LAB — BASIC METABOLIC PANEL
Anion gap: 10 (ref 5–15)
BUN: 16 mg/dL (ref 8–23)
CO2: 24 mmol/L (ref 22–32)
Calcium: 8.7 mg/dL — ABNORMAL LOW (ref 8.9–10.3)
Chloride: 105 mmol/L (ref 98–111)
Creatinine, Ser: 0.9 mg/dL (ref 0.61–1.24)
GFR, Estimated: >60 mL/min (ref >60)
Glucose, Bld: 107 mg/dL — ABNORMAL HIGH (ref 70–99)
Potassium: 4.3 mmol/L (ref 3.5–5.1)
Sodium: 139 mmol/L (ref 135–145)

## 2022-09-17 LAB — CBC WITH DIFFERENTIAL/PLATELET
Abs Immature Granulocytes: 0.07 10*3/uL (ref 0.00–0.07)
Basophils Absolute: 0.1 10*3/uL (ref 0.0–0.1)
Basophils Relative: 1 %
Eosinophils Absolute: 0.2 10*3/uL (ref 0.0–0.5)
Eosinophils Relative: 2 %
HCT: 37.2 % — ABNORMAL LOW (ref 39.0–52.0)
Hemoglobin: 11.8 g/dL — ABNORMAL LOW (ref 13.0–17.0)
Immature Granulocytes: 1 %
Lymphocytes Relative: 7 %
Lymphs Abs: 0.8 10*3/uL (ref 0.7–4.0)
MCH: 25.1 pg — ABNORMAL LOW (ref 26.0–34.0)
MCHC: 31.7 g/dL (ref 30.0–36.0)
MCV: 79.1 fL — ABNORMAL LOW (ref 80.0–100.0)
Monocytes Absolute: 0.8 10*3/uL (ref 0.1–1.0)
Monocytes Relative: 7 %
Neutro Abs: 8.7 10*3/uL — ABNORMAL HIGH (ref 1.7–7.7)
Neutrophils Relative %: 82 %
Platelets: 436 10*3/uL — ABNORMAL HIGH (ref 150–400)
RBC: 4.7 MIL/uL (ref 4.22–5.81)
RDW: 17.4 % — ABNORMAL HIGH (ref 11.5–15.5)
WBC: 10.6 10*3/uL — ABNORMAL HIGH (ref 4.0–10.5)
nRBC: 0 % (ref 0.0–0.2)

## 2022-09-17 LAB — RESP PANEL BY RT-PCR (RSV, FLU A&B, COVID)  RVPGX2
Influenza A by PCR: NEGATIVE
Influenza B by PCR: NEGATIVE
Resp Syncytial Virus by PCR: NEGATIVE
SARS Coronavirus 2 by RT PCR: NEGATIVE

## 2022-09-17 LAB — URINALYSIS, ROUTINE W REFLEX MICROSCOPIC
Bilirubin Urine: NEGATIVE
Glucose, UA: NEGATIVE mg/dL
Hgb urine dipstick: NEGATIVE
Ketones, ur: NEGATIVE mg/dL
Nitrite: NEGATIVE
Protein, ur: NEGATIVE mg/dL
Specific Gravity, Urine: 1.01 (ref 1.005–1.030)
pH: 6.5 (ref 5.0–8.0)

## 2022-09-17 LAB — TROPONIN I (HIGH SENSITIVITY)
Troponin I (High Sensitivity): 15 ng/L (ref <18)
Troponin I (High Sensitivity): 14 ng/L (ref <18)

## 2022-09-17 LAB — I-STAT VENOUS BLOOD GAS, ED
Acid-Base Excess: 1 mmol/L (ref 0.0–2.0)
Bicarbonate: 25.7 mmol/L (ref 20.0–28.0)
Calcium, Ion: 1.2 mmol/L (ref 1.15–1.40)
HCT: 38 % — ABNORMAL LOW (ref 39.0–52.0)
Hemoglobin: 12.9 g/dL — ABNORMAL LOW (ref 13.0–17.0)
O2 Saturation: 43 %
Potassium: 4.2 mmol/L (ref 3.5–5.1)
Sodium: 141 mmol/L (ref 135–145)
TCO2: 27 mmol/L (ref 22–32)
pCO2, Ven: 39.1 mmHg — ABNORMAL LOW (ref 44–60)
pH, Ven: 7.425 (ref 7.25–7.43)
pO2, Ven: 23 mmHg — CL (ref 32–45)

## 2022-09-17 LAB — HEPATIC FUNCTION PANEL
ALT: 18 U/L (ref 0–44)
AST: 28 U/L (ref 15–41)
Albumin: 3.9 g/dL (ref 3.5–5.0)
Alkaline Phosphatase: 116 U/L (ref 38–126)
Bilirubin, Direct: 0.1 mg/dL (ref 0.0–0.2)
Indirect Bilirubin: 0.5 mg/dL (ref 0.3–0.9)
Total Bilirubin: 0.6 mg/dL (ref 0.3–1.2)
Total Protein: 6.9 g/dL (ref 6.5–8.1)

## 2022-09-17 LAB — URINALYSIS, MICROSCOPIC (REFLEX)

## 2022-09-17 LAB — BRAIN NATRIURETIC PEPTIDE: B Natriuretic Peptide: 322.1 pg/mL — ABNORMAL HIGH (ref 0.0–100.0)

## 2022-09-17 MED ORDER — METHYLPREDNISOLONE SODIUM SUCC 125 MG IJ SOLR
125.0000 mg | Freq: Once | INTRAMUSCULAR | Status: AC
  Administered 2022-09-17: 125 mg via INTRAVENOUS
  Filled 2022-09-17: qty 2

## 2022-09-17 MED ORDER — SODIUM CHLORIDE 0.9% FLUSH
3.0000 mL | Freq: Two times a day (BID) | INTRAVENOUS | Status: DC
  Administered 2022-09-17 – 2022-09-21 (×9): 3 mL via INTRAVENOUS

## 2022-09-17 MED ORDER — VANCOMYCIN HCL 1500 MG/300ML IV SOLN
1500.0000 mg | Freq: Once | INTRAVENOUS | Status: AC
  Administered 2022-09-17: 1500 mg via INTRAVENOUS
  Filled 2022-09-17: qty 300

## 2022-09-17 MED ORDER — METHYLPREDNISOLONE SODIUM SUCC 40 MG IJ SOLR
40.0000 mg | Freq: Two times a day (BID) | INTRAMUSCULAR | Status: DC
  Administered 2022-09-18: 40 mg via INTRAVENOUS
  Filled 2022-09-17: qty 1

## 2022-09-17 MED ORDER — SODIUM CHLORIDE 0.9 % IV SOLN
2.0000 g | Freq: Three times a day (TID) | INTRAVENOUS | Status: DC
  Administered 2022-09-17 – 2022-09-22 (×14): 2 g via INTRAVENOUS
  Filled 2022-09-17 (×14): qty 12.5

## 2022-09-17 MED ORDER — IOHEXOL 350 MG/ML SOLN
100.0000 mL | Freq: Once | INTRAVENOUS | Status: AC | PRN
  Administered 2022-09-17: 100 mL via INTRAVENOUS

## 2022-09-17 MED ORDER — ACETAMINOPHEN 500 MG PO TABS: 1000.0000 mg | ORAL_TABLET | Freq: Four times a day (QID) | ORAL | Status: DC | PRN

## 2022-09-17 MED ORDER — DOFETILIDE 500 MCG PO CAPS
500.0000 ug | ORAL_CAPSULE | Freq: Two times a day (BID) | ORAL | Status: DC
  Administered 2022-09-17 – 2022-09-22 (×10): 500 ug via ORAL
  Filled 2022-09-17 (×13): qty 1

## 2022-09-17 MED ORDER — VANCOMYCIN HCL 750 MG/150ML IV SOLN
750.0000 mg | Freq: Two times a day (BID) | INTRAVENOUS | Status: DC
  Administered 2022-09-18 – 2022-09-21 (×8): 750 mg via INTRAVENOUS
  Filled 2022-09-17 (×9): qty 150

## 2022-09-17 NOTE — Plan of Care (Signed)
Problem: Education: Goal: Understanding of cardiac disease, CV risk reduction, and recovery process will improve Outcome: Progressing Goal: Individualized Educational Video(s) Outcome: Progressing   Problem: Activity: Goal: Ability to tolerate increased activity will improve Outcome: Progressing   Problem: Cardiac: Goal: Ability to achieve and maintain adequate cardiovascular perfusion will improve Outcome: Progressing   Problem: Health Behavior/Discharge Planning: Goal: Ability to safely manage health-related needs after discharge will improve Outcome: Progressing   Problem: Education: Goal: Understanding of CV disease, CV risk reduction, and recovery process will improve Outcome: Progressing Goal: Individualized Educational Video(s) Outcome: Progressing   Problem: Activity: Goal: Ability to return to baseline activity level will improve Outcome: Progressing   Problem: Cardiovascular: Goal: Ability to achieve and maintain adequate cardiovascular perfusion will improve Outcome: Progressing Goal: Vascular access site(s) Level 0-1 will be maintained Outcome: Progressing   Problem: Health Behavior/Discharge Planning: Goal: Ability to safely manage health-related needs after discharge will improve Outcome: Progressing   Problem: Education: Goal: Knowledge of cardiac device and self-care will improve Outcome: Progressing Goal: Ability to safely manage health related needs after discharge will improve Outcome: Progressing Goal: Individualized Educational Video(s) Outcome: Progressing   Problem: Cardiac: Goal: Ability to achieve and maintain adequate cardiopulmonary perfusion will improve Outcome: Progressing   Problem: Education: Goal: Knowledge of General Education information will improve Description: Including pain rating scale, medication(s)/side effects and non-pharmacologic comfort measures Outcome: Progressing   Problem: Health Behavior/Discharge  Planning: Goal: Ability to manage health-related needs will improve Outcome: Progressing   Problem: Clinical Measurements: Goal: Ability to maintain clinical measurements within normal limits will improve Outcome: Progressing Goal: Will remain free from infection Outcome: Progressing Goal: Diagnostic test results will improve Outcome: Progressing Goal: Respiratory complications will improve Outcome: Progressing Goal: Cardiovascular complication will be avoided Outcome: Progressing   Problem: Activity: Goal: Risk for activity intolerance will decrease Outcome: Progressing   Problem: Nutrition: Goal: Adequate nutrition will be maintained Outcome: Progressing   Problem: Coping: Goal: Level of anxiety will decrease Outcome: Progressing   Problem: Elimination: Goal: Will not experience complications related to bowel motility Outcome: Progressing Goal: Will not experience complications related to urinary retention Outcome: Progressing   Problem: Pain Managment: Goal: General experience of comfort will improve Outcome: Progressing   Problem: Safety: Goal: Ability to remain free from injury will improve Outcome: Progressing   Problem: Skin Integrity: Goal: Risk for impaired skin integrity will decrease Outcome: Progressing

## 2022-09-17 NOTE — Consult Note (Incomplete)
NAME:  Gary [name], MRN:  [id number], DOB:  [birth year], LOS: 0 ADMISSION DATE:  09/17/2022, CONSULTATION DATE:  9/13 REFERRING MD:  Lazarus Salines, CHIEF COMPLAINT:  ILD flare    History of Present Illness:  77 year old male who presented to the emergency room on 9/13 with chief complaint of 3-day history of worsening shortness of breath.  Has oxygen at home, but typically does not use or needed.  Noting pulse oximetry to drop into the 80s with any exertion, progressed to the point where he was short of breath just talking.  Did endorse mildly productive cough worse than his baseline chronic cough.  No fever chills or chest discomfort no nausea or vomiting no sick exposure.  COVID test outpatient was negative ER evaluation: CT chest was negative for pulmonary emboli.  There was progressive bilateral groundglass opacities throughout both lungs much worse than when comparing CT imaging 6 months ago BNP was 322, troponin I negative. He was placed on supplemental oxygen, pulse IV steroids, and broad-spectrum antibiotics. 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 rate 19, SpO2 97%.       No intake or output data in the 24 hours ending 09/17/22 2137 There were no vitals filed for this visit.  Examination: General: *** HENT: *** Lungs: *** Cardiovascular: *** Abdomen: *** Extremities: *** Neuro:  *** GU: ***  Resolved Hospital Problem list   ***  Assessment & Plan:   Acute hypoxic respiratory failure Wegener's granulomatosis ILD HFrEF  CAF on warfarin Coronary artery disease Hyperlipidemia Type 2 diabetes Hypertension OSA PAD  Pulmonary problem list Acute hypoxic respiratory failure secondary to progressive groundglass bilateral opacities.  Differential diagnosis includes flare of known underlying ILD, pneumonia (bacterial versus viral) less likely pulmonary edema Plan/rec Continue supplemental oxygen Pulse steroids Follow-up sed rate, CRP, pending respiratory viral studies Agree with empiric antibiotics Continue pulse oximetry As needed follow-up imaging  Best Practice (right click and "Reselect all SmartList Selections" daily)   Per primary  Labs   CBC: Recent Labs  Lab 09/17/22 1448 09/17/22 1512  WBC 10.6*  --   NEUTROABS 8.7*  --   HGB 11.8* 12.9*  HCT 37.2* 38.0*  MCV 79.1*  --   PLT 436*  --     Basic Metabolic Panel: Recent Labs  Lab 09/17/22 1448 09/17/22 1512  NA 139 141  K 4.3 4.2  CL 105  --   CO2 24  --   GLUCOSE 107*  --   BUN 16  --   CREATININE 0.90  --   CALCIUM 8.7*  --    GFR: CrCl cannot be calculated (Unknown ideal weight.). Recent Labs  Lab 09/17/22 1448  WBC 10.6*    Liver Function Tests: Recent Labs  Lab 09/17/22 1448  AST 28  ALT 18  ALKPHOS 116  BILITOT 0.6  PROT 6.9  ALBUMIN 3.9   No results for input(s): "LIPASE", "AMYLASE" in the last 168 hours. No results for input(s): "AMMONIA" in  the last 168 hours.  ABG    Component Value Date/Time   HCO3 25.7 09/17/2022 1512   TCO2 27 09/17/2022 1512   O2SAT 43 09/17/2022 1512     Coagulation Profile: Recent Labs  Lab 09/17/22 1448  INR 1.8*    Cardiac Enzymes: No results for input(s): "CKTOTAL", "CKMB", "CKMBINDEX", "TROPONINI" in the last 168 hours.  HbA1C: Hgb A1c MFr Bld  Date/Time Value Ref Range Status  03/02/2022 12:39 AM 6.8 (H)  4.8 - 5.6 % Final    Comment:    (NOTE)         Prediabetes: 5.7 - 6.4         Diabetes: >6.4         Glycemic control for adults with diabetes: <7.0     CBG: No results for input(s): "GLUCAP" in the last 168 hours.  Review of Systems:   ***  Past Medical History:  He,  has a past medical history of Aortic aneurysm (HCC) (02/20/2016), Arthritis, Atrial fibrillation (HCC) (11/13/2014), Cancer (HCC), Chronic combined systolic and diastolic heart failure (HCC) (16/10/9602), Coronary artery disease, Depression, Essential hypertension (11/13/2014), Hyperlipidemia, Hyperlipidemia (11/13/2014), Hypertension, Sleep apnea, and Wegener's granulomatosis (11/18/2017).   Surgical History:   Past Surgical History:  Procedure Laterality Date   CARDIOVERSION N/A 12/06/2014   Procedure: CARDIOVERSION;  Surgeon: Chilton Si, MD;  Location: [facility] ENDOSCOPY;  Service: Cardiovascular;  Laterality: N/A;   CARDIOVERSION N/A 05/26/2017   Procedure: CARDIOVERSION;  Surgeon: Chilton Si, MD;  Location: [facility] ENDOSCOPY;  Service: Cardiovascular;  Laterality: N/A;   CARDIOVERSION N/A 04/25/2019   Procedure: CARDIOVERSION;  Surgeon: Thurmon Fair, MD;  Location: [facility] ENDOSCOPY;  Service: Cardiovascular;  Laterality: N/A;   CARDIOVERSION N/A 02/26/2022   Procedure: CARDIOVERSION;  Surgeon: Chilton Si, MD;  Location: [facility] ENDOSCOPY;  Service: Cardiovascular;  Laterality: N/A;   CARDIOVERSION N/A 05/13/2022   Procedure: CARDIOVERSION;  Surgeon: Quintella Reichert, MD;  Location: [facility] INVASIVE CV LAB;  Service: Cardiovascular;  Laterality: N/A;   CARPAL TUNNEL RELEASE     CORONARY ANGIOPLASTY     multiple stents   CORONARY ARTERY BYPASS GRAFT  2006   CORONARY STENT INTERVENTION N/A 09/28/2021   Procedure: CORONARY STENT INTERVENTION;  Surgeon: Runell Gess, MD;  Location: [facility] INVASIVE CV LAB;  Service: Cardiovascular;  Laterality: N/A;  SVG-PDA   EYE SURGERY     right eye-  muscular repair   LEFT HEART CATH  AND CORS/GRAFTS ANGIOGRAPHY N/A 09/28/2021   Procedure: LEFT HEART CATH AND CORS/GRAFTS ANGIOGRAPHY;  Surgeon: Runell Gess, MD;  Location: [facility] INVASIVE CV LAB;  Service: Cardiovascular;  Laterality: N/A;   TOTAL HIP ARTHROPLASTY  01/03/2012   Procedure: TOTAL HIP ARTHROPLASTY;  Surgeon: Jacki Cones, MD;  Location: [facility] ORS;  Service: Orthopedics;  Laterality: Right;     Social History:   reports that he quit smoking about 38 years ago. His smoking use included cigarettes. He has never used smokeless tobacco. He reports that he does not drink alcohol and does not use drugs.   Family History:  His family history includes Heart attack in his maternal grandfather, mother, paternal grandfather, and paternal grandmother; Other in his father; Stroke in his maternal grandmother.   Allergies Allergies  Allergen Reactions   Farxiga [Dapagliflozin]     Lightheaded and low blood pressure    Statins Other (See Comments)    Muscle pain     Home Medications  Prior to Admission medications   Medication Sig Start Date End Date  Taking? Authorizing Provider  ALPRAZolam Prudy Feeler) 0.5 MG tablet Take 0.5 mg by mouth at bedtime. Takes at 11:00pm    [provider]  amLODipine (NORVASC) 2.5 MG tablet Take 2.5 mg by mouth daily. Takes at 6:30am 04/13/22   [provider]  calcium carbonate (OS-CAL) 600 MG TABS tablet Take 600 mg by mouth daily with breakfast.    [provider]  carvedilol (COREG) 25 MG tablet Take 0.5 tablets (12.5 mg total) by mouth 2 (two) times daily. Patient taking differently: Take 12.5 mg by mouth 2 (two) times daily. Takes at 6:30am and 5:30pm 03/06/22   Zannie Cove, MD  Cholecalciferol (VITAMIN D3 PO) Taking 250 mcg by mouth daily Takes at 6:30am    [provider]  clopidogrel (PLAVIX) 75 MG tablet Take 1 tablet (75 mg total) by mouth daily with breakfast. Take every day for 1 year Patient taking differently: Take 75 mg by mouth daily with  breakfast. Takes at 4am 09/30/21   Jonita Albee, PA-C  Coenzyme Q10 (CO Q 10 PO) Take 400 mg by mouth daily. Takes at 6:30am    [provider]  dofetilide (TIKOSYN) 500 MCG capsule Take 1 capsule (500 mcg total) by mouth 2 (two) times daily. 06/17/22   Eustace Pen, PA-C  Evolocumab (REPATHA SURECLICK) 140 MG/ML SOAJ INJECT 140 MG INTO THE SKIN EVERY 14 (FOURTEEN) DAYS. INJECT 1 PEN INTO THE FATTY TISSUE SKIN EVERY 14 DAY Patient taking differently: Inject 140 mg into the skin every 14 (fourteen) days. 05/06/22   Chilton Si, MD  famotidine (PEPCID) 20 MG tablet Take 1 tablet (20 mg total) by mouth 2 (two) times daily. Patient taking differently: Take 20 mg by mouth 2 (two) times daily. Takes at 6:30am and 5:30pm 05/25/22   Eustace Pen, PA-C  ferrous sulfate 325 (65 FE) MG EC tablet Take 325 mg by mouth at bedtime. Takes at 5:30pm    [provider]  furosemide (LASIX) 40 MG tablet Take 1 tablet (40 mg total) by mouth daily as needed. Patient taking differently: Take 40 mg by mouth daily as needed. ONLY as needed 05/11/22   Graciella Freer, PA-C  KLOR-CON M20 20 MEQ tablet TAKE 2 TABLETS BY MOUTH DAILY 07/12/22   Eustace Pen, PA-C  levothyroxine (SYNTHROID, LEVOTHROID) 137 MCG tablet Take 137 mcg by mouth daily before breakfast. Takes at 4 am 10/27/14   [provider]  loratadine (CLARITIN) 10 MG tablet Take 10 mg by mouth daily. Takes at 6:30am    [provider]  metFORMIN (GLUCOPHAGE) 500 MG tablet Take 500 mg by mouth 2 (two) times daily. Takes at 6:30am and 5:30pm 10/27/17   [provider]  Multiple Vitamin (MULTIVITAMIN WITH MINERALS) TABS tablet Take 1 tablet by mouth daily. Centrum silver men Takes at 6:30am    [provider]  nitroGLYCERIN (NITROSTAT) 0.4 MG SL tablet Place 1 tablet (0.4 mg total) under the tongue every 5 (five) minutes x 3 doses as needed for chest pain. 09/29/21   Jonita Albee, PA-C   pantoprazole (PROTONIX) 40 MG tablet Take 40 mg by mouth 2 (two) times daily. Takes at 6:30am and 5:30pm    [provider]  predniSONE (DELTASONE) 5 MG tablet Take 5 mg by mouth daily with breakfast. Takes at 6:30am    [provider]  riTUXimab (RITUXAN IV) Inject 1 application  into the vein See admin instructions. Take every two weeks and every six months alternating  [provider]  testosterone cypionate (DEPOTESTOTERONE CYPIONATE) 100 MG/ML injection Inject 100 mg into the muscle every 14 (fourteen) days. 10/09/14   [provider]  venlafaxine XR (EFFEXOR-XR) 150 MG 24 hr capsule Take 150 mg by mouth daily. Takes at 6:30am    [provider]  warfarin (COUMADIN) 5 MG tablet TAKE 1/2 TABLET TO 1 TABLET BY MOUTH DAILY AS DIRECTED BY COUMADIN CLINIC 08/09/22   Chilton Si, MD  Zinc Chelated 22.5 MG TABS Take 22.5 mg by mouth daily. Takes at 6:30am    [provider]     Critical care time: ***

## 2022-09-17 NOTE — ED Notes (Signed)
RT ambulated with pt with pulse ox. Pt's O2 sat dropped to 88% while ambulating. Once we returned to the room and attached him to the monitor his O2 sat was 83%. RT placed pt on a 2L [place]. Pt has O2 at home he uses PRN.

## 2022-09-17 NOTE — Progress Notes (Signed)
Pharmacy Antibiotic Note  Gary [name] is a 77 y.o. male admitted on 09/17/2022 presenting with worsening SOB, concern for pna.  Pharmacy has been consulted for vancomycin dosing.  Vancomycin 1500 mg IV x 1 given  Plan: Vancomycin 750 mg IV q 12h (eAUC 474) Monitor renal function, Cx/PCR to narrow Vancomycin levels as needed     Temp (24hrs), Avg:98.1 F (36.7 C), Min:97.8 F (36.6 C), Max:98.3 F (36.8 C)  Recent Labs  Lab 09/17/22 1448  WBC 10.6*  CREATININE 0.90    CrCl cannot be calculated (Unknown ideal weight.).    Allergies  Allergen Reactions   Farxiga [Dapagliflozin]     Lightheaded and low blood pressure    Statins Other (See Comments)    Muscle pain    Daylene Posey, PharmD, [facility] Clinical Pharmacist ED Pharmacist Phone # [phone] 09/17/2022 10:22 PM

## 2022-09-17 NOTE — ED Triage Notes (Signed)
Sob started 3 days ago, getting worse. Has had to use home o2 this week, normally doesn't need it continuous Labored breathing and  unable to talk without stopping Neg home covid test yesterday

## 2022-09-17 NOTE — ED Provider Notes (Signed)
[place] [facility]ERGENC[facility] DEPARTMENT AT [facility] Provider Note   CSN: [id number] Arrival date & time: 09/17/22  1408     History  Chief Complaint  Patient presents with   Shortness of Breath    Gary [name] is a 77 y.o. male.   Shortness of Breath   77 year old male presents emergency department with complaints of shortness of breath.  Reports worsening of shortness of breath from his baseline around 3 days ago.  States that he has been short of breath with any physical activity.  States he has been taking his oxygen at home and has dropped into the 80s whenever performing activity.  States he feels now short of breath when talking.  Also states that he has been with mildly productive cough over the past few days.  States he has a baseline cough but cough is slightly worse than normal.  States he felt he had COVID so he took a COVID test at home which was negative yesterday.  Denies any fever, chills, chest pain, abdominal pain, nausea, vomiting.  Patient is on 2 L nasal cannula at home as needed but has been having to wear his oxygen throughout the day due to feelings of shortness of breath.  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 Authorizing Provider  ALPRAZolam Prudy Feeler) 0.5 MG tablet Take 0.5 mg by mouth at bedtime. Takes at 11:00pm    [provider]  amLODipine (NORVASC) 2.5 MG tablet Take 2.5 mg by mouth daily. Takes at 6:30am 04/13/22   [provider]  calcium carbonate (OS-CAL) 600 MG TABS tablet Take 600 mg by mouth daily with breakfast.    [provider]  carvedilol (COREG) 25 MG tablet Take 0.5 tablets (12.5  mg total) by mouth 2 (two) times daily. Patient taking differently: Take 12.5 mg by mouth 2 (two) times daily. Takes at 6:30am and 5:30pm 03/06/22   Zannie Cove, MD  Cholecalciferol (VITAMIN D3 PO) Taking 250 mcg by mouth daily Takes at 6:30am    [provider]  clopidogrel (PLAVIX) 75 MG tablet Take 1 tablet (75 mg total) by mouth daily with breakfast. Take every day for 1 year Patient taking differently: Take 75 mg by mouth daily with breakfast. Takes at 4am 09/30/21   Jonita Albee, PA-C  Coenzyme Q10 (CO Q 10 PO) Take 400 mg by mouth daily. Takes at 6:30am    [provider]  dofetilide (TIKOSYN) 500 MCG capsule Take 1 capsule (500 mcg total) by mouth 2 (two) times daily. 06/17/22   Eustace Pen, PA-C  Evolocumab (REPATHA SURECLICK) 140 MG/ML SOAJ INJECT 140 MG INTO THE SKIN EVERY 14 (FOURTEEN) DAYS. INJECT 1 PEN INTO THE FATTY TISSUE SKIN EVERY 14 DAY Patient taking differently: Inject 140 mg into the skin every 14 (fourteen) days. 05/06/22   Chilton Si, MD  famotidine (PEPCID) 20 MG tablet Take 1 tablet (20 mg total) by mouth 2 (two) times daily. Patient taking differently: Take 20 mg by mouth 2 (two) times daily. Takes at 6:30am and 5:30pm 05/25/22   Eustace Pen, PA-C  ferrous sulfate 325 (65 FE) MG EC tablet Take 325 mg by mouth at bedtime. Takes at 5:30pm    [provider]  furosemide (LASIX)  40 MG tablet Take 1 tablet (40 mg total) by mouth daily as needed. Patient taking differently: Take 40 mg by mouth daily as needed. ONLY as needed 05/11/22   Graciella Freer, PA-C  KLOR-CON M20 20 MEQ tablet TAKE 2 TABLETS BY MOUTH DAILY 07/12/22   Eustace Pen, PA-C  levothyroxine (SYNTHROID, LEVOTHROID) 137 MCG tablet Take 137 mcg by mouth daily before breakfast. Takes at 4 am 10/27/14   [provider]  loratadine (CLARITIN) 10 MG tablet Take 10 mg by mouth daily. Takes at 6:30am    [provider]  metFORMIN (GLUCOPHAGE) 500  MG tablet Take 500 mg by mouth 2 (two) times daily. Takes at 6:30am and 5:30pm 10/27/17   [provider]  Multiple Vitamin (MULTIVITAMIN WITH MINERALS) TABS tablet Take 1 tablet by mouth daily. Centrum silver men Takes at 6:30am    [provider]  nitroGLYCERIN (NITROSTAT) 0.4 MG SL tablet Place 1 tablet (0.4 mg total) under the tongue every 5 (five) minutes x 3 doses as needed for chest pain. 09/29/21   Jonita Albee, PA-C  pantoprazole (PROTONIX) 40 MG tablet Take 40 mg by mouth 2 (two) times daily. Takes at 6:30am and 5:30pm    [provider]  predniSONE (DELTASONE) 5 MG tablet Take 5 mg by mouth daily with breakfast. Takes at 6:30am    [provider]  riTUXimab (RITUXAN IV) Inject 1 application  into the vein See admin instructions. Take every two weeks and every six months alternating    [provider]  testosterone cypionate (DEPOTESTOTERONE CYPIONATE) 100 MG/ML injection Inject 100 mg into the muscle every 14 (fourteen) days. 10/09/14   [provider]  venlafaxine XR (EFFEXOR-XR) 150 MG 24 hr capsule Take 150 mg by mouth daily. Takes at 6:30am    [provider]  warfarin (COUMADIN) 5 MG tablet TAKE 1/2 TABLET TO 1 TABLET BY MOUTH DAILY AS DIRECTED BY COUMADIN CLINIC 08/09/22   Chilton Si, MD  Zinc Chelated 22.5 MG TABS Take 22.5 mg by mouth daily. Takes at 6:30am    [provider]      Allergies    Farxiga [dapagliflozin] and Statins    Review of Systems   Review of Systems  Respiratory:  Positive for shortness of breath.   All other systems reviewed and are negative.   Physical Exam Updated Vital Signs BP (!) 144/69   Pulse 66   Temp 98.2 F (36.8 C) (Oral)   Resp 17   SpO2 98%  Physical Exam Vitals and nursing note reviewed.  Constitutional:      General: He is not in acute distress.    Appearance: He is well-developed.  HENT:     Head: Normocephalic and atraumatic.  Eyes:      Conjunctiva/sclera: Conjunctivae normal.  Cardiovascular:     Rate and Rhythm: Normal rate and regular rhythm.     Heart sounds: No murmur heard. Pulmonary:     Effort: No respiratory distress.     Comments: Possible Rales right lower lung field.  Patient with increased breathing effort.  Conversationally dyspneic. Abdominal:     Palpations: Abdomen is soft.     Tenderness: There is no abdominal tenderness.  Musculoskeletal:        General: No swelling.     Cervical back: Neck supple.     Comments: Minimal pitting edema bilateral lower extremities.  Patient states this is baseline.  Skin:    General: Skin is warm and dry.  Capillary Refill: Capillary refill takes less than 2 seconds.  Neurological:     Mental Status: He is alert.  Psychiatric:        Mood and Affect: Mood normal.     ED Results / Procedures / Treatments   Labs (all labs ordered are listed, but only abnormal results are displayed) Labs Reviewed  BASIC METABOLIC PANEL - Abnormal; Notable for the following components:      Result Value   Glucose, Bld 107 (*)    Calcium 8.7 (*)    All other components within normal limits  CBC WITH DIFFERENTIAL/PLATELET - Abnormal; Notable for the following components:   WBC 10.6 (*)    Hemoglobin 11.8 (*)    HCT 37.2 (*)    MCV 79.1 (*)    MCH 25.1 (*)    RDW 17.4 (*)    Platelets 436 (*)    Neutro Abs 8.7 (*)    All other components within normal limits  BRAIN NATRIURETIC PEPTIDE - Abnormal; Notable for the following components:   B Natriuretic Peptide 322.1 (*)    All other components within normal limits  PROTIME-INR - Abnormal; Notable for the following components:   Prothrombin Time 21.0 (*)    INR 1.8 (*)    All other components within normal limits  I-STAT VENOUS BLOOD GAS, ED - Abnormal; Notable for the following components:   pCO2, Ven 39.1 (*)    pO2, Ven 23 (*)    HCT 38.0 (*)    Hemoglobin 12.9 (*)    All other components within normal limits  RESP  PANEL BY RT-PCR (RSV, FLU A&B, COVID)  [id number]  HEPATIC FUNCTION PANEL  TROPONIN I (HIGH SENSITIVITY)  TROPONIN I (HIGH SENSITIVITY)    EKG EKG Interpretation Date/Time:  Friday September 17 2022 14:16:21 EDT Ventricular Rate:  64 PR Interval:  164 QRS Duration:  98 QT Interval:  476 QTC Calculation: 491 R Axis:   -35  Text Interpretation: Normal sinus rhythm Left axis deviation Left ventricular hypertrophy with repolarization abnormality ( R in aVL ) Prolonged QT Abnormal ECG When compared with ECG of 14-Jul-2022 10:08, No significant change was found Confirmed by Meridee Score [phone]) on 09/17/2022 2:25:38 PM  Radiology CT Angio Chest PE W/Cm &/Or Wo Cm  Result Date: 09/17/2022 CLINICAL DATA:  Shortness of breath, evaluate for PE EXAM: CT ANGIOGRAPHY CHEST WITH CONTRAST TECHNIQUE: Multidetector CT imaging of the chest was performed using the standard protocol during bolus administration of intravenous contrast. Multiplanar CT image reconstructions and MIPs were obtained to evaluate the vascular anatomy. RADIATION DOSE REDUCTION: This exam was performed according to the departmental dose-optimization program which includes automated exposure control, adjustment of the mA and/or kV according to patient size and/or use of iterative reconstruction technique. CONTRAST:  OMNIPAQUE IOHEXOL 350 MG/ML SOLN COMPARISON:  Chest radiograph dated 09/17/2022 FINDINGS: Cardiovascular: Satisfactory opacification the bilateral pulmonary arteries to the segmental level. No evidence of pulmonary embolism Study is not tailored for evaluation of the thoracic aorta. No evidence of thoracic aortic aneurysm or dissection. Cardiomegaly.  No pericardial effusion. Severe three-vessel coronary atherosclerosis with LAD stents. Mediastinum/Nodes: Small mediastinal nodes, including a dominant 15 mm short axis right hilar node. Visualized thyroid is unremarkable. Lungs/Pleura: Progressive patchy ground-glass opacities  in the lungs bilaterally, lower lobe and subpleural/peripheral predominant, compatible with progressive chronic interstitial lung disease. Associated mild subpleural reticulation/fibrosis. 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 Superimposed mild patchy opacities in the anterior  left upper lobe (series 6/image 55), grossly unchanged from the prior, likely reflecting scarring. No focal consolidation. No suspicious pulmonary nodules. Mild paraseptal emphysematous changes in the upper lobes. No pleural effusion or pneumothorax. Upper Abdomen: Visualized upper abdomen is notable for tiny layering gallstones and vascular calcifications. Musculoskeletal: Degenerative changes of the visualized thoracolumbar spine. Median sternotomy. Review of the MIP images confirms the above findings. IMPRESSION: No evidence of pulmonary embolism. Progressive chronic interstitial lung disease, favoring fibrotic NSIP. Aortic Atherosclerosis (ICD10-I70.0) and Emphysema (ICD10-J43.9). Electronically Signed   By: Charline Bills M.D.   On: 09/17/2022 17:13   DG Chest 2 View  Result Date: 09/17/2022 CLINICAL DATA:  Shortness of breath EXAM: CHEST - 2 VIEW COMPARISON:  04/07/2022 FINDINGS: Status post median sternotomy. Stable cardiomegaly. Similar heterogeneous bilateral opacities. No pleural effusion or pneumothorax. No acute osseous abnormality. IMPRESSION: Cardiomegaly with similar heterogeneous bilateral opacities, which could represent pulmonary edema or infection. Electronically Signed   By: Wiliam Ke M.D.   On: 09/17/2022 16:12    Procedures .Critical Care  Performed by: Peter Garter, PA Authorized by: Peter Garter, PA   Critical care provider statement:    Critical care time (minutes):  49   Critical care was necessary to treat or prevent imminent or life-threatening deterioration of the following conditions:  Respiratory failure   Critical care was time  spent personally by me on the following activities:  Development of treatment plan with patient or surrogate, discussions with consultants, evaluation of patient's response to treatment, examination of patient, ordering and review of laboratory studies, ordering and review of radiographic studies, ordering and performing treatments and interventions, pulse oximetry, re-evaluation of patient's condition and review of old charts   I assumed direction of critical care for this patient from another provider in my specialty: no     Care discussed with: admitting provider       Medications Ordered in ED Medications  iohexol (OMNIPAQUE) 350 MG/ML injection 100 mL (100 mLs Intravenous Contrast Given 09/17/22 1553)  methylPREDNISolone sodium succinate (SOLU-MEDROL) 125 mg/2 mL injection 125 mg (125 mg Intravenous Given 09/17/22 1739)    ED Course/ Medical Decision Making/ A&P Clinical Course as of 09/17/22 1823  Fri Sep 17, 2022  1609 Patient walked by respiratory therapy and noted oxygen saturations decreased to 83%.  Patient subsequently placed on 2 L nasal cannula. [CR]  1731 Consulted Dr. Janeann Forehand of pulmonology who recommended admission through hospitalist and beginning on stress dose of steroids at 125 Solu-Medrol.  Will admit to hospitalist. [CR]  1749 Consulted hospitalist Dr. Sharl Ma regarding the patient who agreed with admission and assume further treatment/care. [CR]    Clinical Course User Index [CR] Peter Garter, PA                                 Medical Decision Making Amount and/or Complexity of Data Reviewed Labs: ordered. Radiology: ordered.   This patient presents to the ED for concern of shortness of breath, this involves an extensive number of treatment options, and is a complaint that carries with it a high risk of complications and morbidity.  The differential diagnosis includes The causes for shortness of breath include but are not limited to Cardiac (AHF,  pericardial effusion and tamponade, arrhythmias, ischemia, etc) Respiratory (COPD, asthma, pneumonia, pneumothorax, primary pulmonary hypertension, PE/VQ mismatch) Hematological (anemia)  Co morbidities that complicate the patient evaluation  See HPI  Additional history obtained:  Additional history obtained from EMR External records from outside source obtained and reviewed including hospital records   Lab Tests:  I Ordered, and personally interpreted labs.  The pertinent results include: Patient leukocytosis of 10.6, anemia of 11.8 of which is microcytic in nature.  Thrombocytosis of 436.  INR subtherapeutic of 1.8.  No transaminitis.  No renal dysfunction.  Mild hypocalcemia 8.7 otherwise, electrolytes within normal limits.  Initial troponin of 15.  Respiratory viral panel negative.  BNP mildly elevated of 322.1.   Imaging Studies ordered:  I ordered imaging studies including chest x-ray, CT angio chest PE I independently visualized and interpreted imaging which showed  Chest x-ray: Cardiomegaly with similar heterogeneous bilateral opacities which could represent pulmonary edema or infection. CT angio chest PE: No evidence of PE.  Progressive chronic interstitial lung disease favoring fibrotic instep.  Aortic atherosclerosis.  Emphysema. I agree with the radiologist interpretation  Cardiac Monitoring: / EKG:  The patient was maintained on a cardiac monitor.  I personally viewed and interpreted the cardiac monitored which showed an underlying rhythm of: Normal sinus rhythm.  Left axis deviation.  LVH.  Prolonged QT.  Similar appearing EKG to prior performed.  Consultations Obtained:  See ED course  Problem List / ED Course / Critical interventions / Medication management  Acute respiratory failure with hypoxia, subtherapeutic INR I ordered medication including Solu-Medrol   Reevaluation of the patient after these medicines showed that the patient improved I have reviewed  the patients home medicines and have made adjustments as needed   Social Determinants of Health:  Former cigarette use quit in 1995.  Denies illicit drug use.   Test / Admission - Considered:  Acute respiratory failure with hypoxia, subtherapeutic INR Vitals signs significant for hypertension with blood pressure of 144/69. Otherwise within normal range and stable throughout visit. Laboratory/imaging studies significant for: See of 77 year old male presents emergency department with complaints of shortness of breath.  Shortness of breath worse over the past 3 days or so with also mild cough.  On exam, patient conversationally dyspneic with oxygen saturations hovering in the low 90s.  Patient was ambulated by nursing staff found with oxygen saturation of 83.  Patient with as needed at home oxygen but never requiring oxygen consistently.  Patient with delta negative troponin, lack of acute ischemic change on EKG.  Low suspicion for ACS.  Patient with mild elevated BNP at 322.1 but without evidence of volume overload clinically; low suspicion for CHF exacerbation.  Patient without evidence of worsening anemia from baseline.  No obvious wheeze/rhonchi on respiratory exam concerning for asthma/COPD.  Viral panel negative.  CTA negative for PE, pneumonia, pneumothorax or other abnormality.  CT exam did show chronic interstitial changes.  Consulted pulmonology given that patient has new oxygen requirement of 2 L nasal cannula who recommended Solu-Medrol and admission.  Will consult hospitalist.  Treatment plan discussed at length with patient and he acknowledged understanding was agreeable to said plan.  Patient overall well-appearing, afebrile in no acute distress.         Final Clinical Impression(s) / ED Diagnoses Final diagnoses:  Acute on chronic respiratory failure with hypoxia (HCC)  Subtherapeutic international normalized ratio (INR)    Rx / DC Orders ED Discharge Orders     None          Peter Garter, [place] 09/17/22 1823    Cathren Laine, MD 09/18/22 1645

## 2022-09-18 DIAGNOSIS — J9601 Acute respiratory failure with hypoxia: Secondary | ICD-10-CM

## 2022-09-18 DIAGNOSIS — J849 Interstitial pulmonary disease, unspecified: Secondary | ICD-10-CM

## 2022-09-18 LAB — RESPIRATORY PANEL BY PCR

## 2022-09-18 LAB — GLUCOSE, CAPILLARY
Glucose-Capillary: 157 mg/dL — ABNORMAL HIGH (ref 70–99)
Glucose-Capillary: 173 mg/dL — ABNORMAL HIGH (ref 70–99)
Glucose-Capillary: 162 mg/dL — ABNORMAL HIGH (ref 70–99)
Glucose-Capillary: 182 mg/dL — ABNORMAL HIGH (ref 70–99)

## 2022-09-18 LAB — CBC
HCT: 39.3 % (ref 39.0–52.0)
Hemoglobin: 12.1 g/dL — ABNORMAL LOW (ref 13.0–17.0)
MCH: 24.6 pg — ABNORMAL LOW (ref 26.0–34.0)
MCHC: 30.8 g/dL (ref 30.0–36.0)
MCV: 80 fL (ref 80.0–100.0)
Platelets: 458 10*3/uL — ABNORMAL HIGH (ref 150–400)
RBC: 4.91 MIL/uL (ref 4.22–5.81)
RDW: 17.4 % — ABNORMAL HIGH (ref 11.5–15.5)
WBC: 10.1 10*3/uL (ref 4.0–10.5)
nRBC: 0 % (ref 0.0–0.2)

## 2022-09-18 LAB — BASIC METABOLIC PANEL
Anion gap: 11 (ref 5–15)
BUN: 16 mg/dL (ref 8–23)
CO2: 23 mmol/L (ref 22–32)
Calcium: 8.9 mg/dL (ref 8.9–10.3)
Chloride: 106 mmol/L (ref 98–111)
Creatinine, Ser: 0.89 mg/dL (ref 0.61–1.24)
GFR, Estimated: >60 mL/min (ref >60)
Glucose, Bld: 139 mg/dL — ABNORMAL HIGH (ref 70–99)
Potassium: 4 mmol/L (ref 3.5–5.1)
Sodium: 140 mmol/L (ref 135–145)

## 2022-09-18 LAB — SEDIMENTATION RATE: Sed Rate: 38 mm/h — ABNORMAL HIGH (ref 0–16)

## 2022-09-18 LAB — PROTIME-INR
INR: 1.7 — ABNORMAL HIGH (ref 0.8–1.2)
Prothrombin Time: 19.9 s — ABNORMAL HIGH (ref 11.4–15.2)

## 2022-09-18 LAB — C-REACTIVE PROTEIN: CRP: 6.6 mg/dL — ABNORMAL HIGH (ref <1.0)

## 2022-09-18 LAB — PHOSPHORUS: Phosphorus: 2.8 mg/dL (ref 2.5–4.6)

## 2022-09-18 LAB — MAGNESIUM: Magnesium: 2.1 mg/dL (ref 1.7–2.4)

## 2022-09-18 LAB — PROCALCITONIN: Procalcitonin: <0.1 ng/mL

## 2022-09-18 MED ORDER — FAMOTIDINE 20 MG PO TABS
20.0000 mg | ORAL_TABLET | Freq: Two times a day (BID) | ORAL | Status: DC
  Administered 2022-09-18 – 2022-09-22 (×9): 20 mg via ORAL
  Filled 2022-09-18 (×9): qty 1

## 2022-09-18 MED ORDER — WARFARIN - PHARMACIST DOSING INPATIENT: Freq: Every day | Status: DC

## 2022-09-18 MED ORDER — FERROUS SULFATE 325 (65 FE) MG PO TABS
325.0000 mg | ORAL_TABLET | ORAL | Status: DC
  Administered 2022-09-18 – 2022-09-21 (×4): 325 mg via ORAL
  Filled 2022-09-18 (×4): qty 1

## 2022-09-18 MED ORDER — VENLAFAXINE HCL ER 150 MG PO CP24
150.0000 mg | ORAL_CAPSULE | ORAL | Status: DC
  Administered 2022-09-18 – 2022-09-22 (×5): 150 mg via ORAL
  Filled 2022-09-18 (×5): qty 1

## 2022-09-18 MED ORDER — FUROSEMIDE 40 MG PO TABS
40.0000 mg | ORAL_TABLET | Freq: Every day | ORAL | Status: DC
  Administered 2022-09-18 – 2022-09-22 (×5): 40 mg via ORAL
  Filled 2022-09-18 (×5): qty 1

## 2022-09-18 MED ORDER — ALBUTEROL SULFATE (2.5 MG/3ML) 0.083% IN NEBU: 2.5000 mg | INHALATION_SOLUTION | RESPIRATORY_TRACT | Status: DC | PRN

## 2022-09-18 MED ORDER — CLOPIDOGREL BISULFATE 75 MG PO TABS
75.0000 mg | ORAL_TABLET | Freq: Every day | ORAL | Status: DC
  Administered 2022-09-18 – 2022-09-22 (×5): 75 mg via ORAL
  Filled 2022-09-18 (×5): qty 1

## 2022-09-18 MED ORDER — CARVEDILOL 12.5 MG PO TABS
12.5000 mg | ORAL_TABLET | Freq: Two times a day (BID) | ORAL | Status: DC
  Administered 2022-09-18 – 2022-09-22 (×9): 12.5 mg via ORAL
  Filled 2022-09-18 (×9): qty 1

## 2022-09-18 MED ORDER — PANTOPRAZOLE SODIUM 40 MG PO TBEC
40.0000 mg | DELAYED_RELEASE_TABLET | Freq: Two times a day (BID) | ORAL | Status: DC
  Administered 2022-09-18 – 2022-09-22 (×9): 40 mg via ORAL
  Filled 2022-09-18 (×9): qty 1

## 2022-09-18 MED ORDER — LEVOTHYROXINE SODIUM 25 MCG PO TABS
137.0000 ug | ORAL_TABLET | Freq: Every day | ORAL | Status: DC
  Administered 2022-09-18 – 2022-09-22 (×5): 137 ug via ORAL
  Filled 2022-09-18 (×5): qty 1

## 2022-09-18 MED ORDER — INSULIN ASPART 100 UNIT/ML IJ SOLN
0.0000 [IU] | Freq: Every day | INTRAMUSCULAR | Status: DC
  Administered 2022-09-19: 2 [IU] via SUBCUTANEOUS

## 2022-09-18 MED ORDER — INSULIN ASPART 100 UNIT/ML IJ SOLN
0.0000 [IU] | Freq: Three times a day (TID) | INTRAMUSCULAR | Status: DC
  Administered 2022-09-18: 1 [IU] via SUBCUTANEOUS

## 2022-09-18 MED ORDER — INSULIN ASPART 100 UNIT/ML IJ SOLN
0.0000 [IU] | Freq: Three times a day (TID) | INTRAMUSCULAR | Status: DC
  Administered 2022-09-18: 3 [IU] via SUBCUTANEOUS
  Administered 2022-09-18: 2 [IU] via SUBCUTANEOUS
  Administered 2022-09-19 (×3): 3 [IU] via SUBCUTANEOUS
  Administered 2022-09-20: 5 [IU] via SUBCUTANEOUS
  Administered 2022-09-20: 3 [IU] via SUBCUTANEOUS
  Administered 2022-09-20 – 2022-09-21 (×2): 2 [IU] via SUBCUTANEOUS
  Administered 2022-09-21 (×2): 3 [IU] via SUBCUTANEOUS
  Administered 2022-09-22: 5 [IU] via SUBCUTANEOUS

## 2022-09-18 MED ORDER — WARFARIN SODIUM 7.5 MG PO TABS: 7.5000 mg | ORAL_TABLET | Freq: Once | ORAL | Status: DC

## 2022-09-18 MED ORDER — SODIUM CHLORIDE 0.9 % IV SOLN
500.0000 mg | Freq: Two times a day (BID) | INTRAVENOUS | Status: DC
  Administered 2022-09-18 – 2022-09-21 (×6): 500 mg via INTRAVENOUS
  Filled 2022-09-18 (×9): qty 500

## 2022-09-18 MED ORDER — INSULIN ASPART 100 UNIT/ML IJ SOLN: 0.0000 [IU] | Freq: Three times a day (TID) | INTRAMUSCULAR | Status: DC

## 2022-09-18 MED ORDER — WARFARIN SODIUM 6 MG PO TABS
6.0000 mg | ORAL_TABLET | Freq: Once | ORAL | Status: AC
  Administered 2022-09-18: 6 mg via ORAL
  Filled 2022-09-18: qty 1

## 2022-09-18 MED ORDER — AMLODIPINE BESYLATE 5 MG PO TABS
2.5000 mg | ORAL_TABLET | ORAL | Status: DC
  Administered 2022-09-18 – 2022-09-22 (×5): 2.5 mg via ORAL
  Filled 2022-09-18 (×5): qty 1

## 2022-09-18 NOTE — Progress Notes (Signed)
PROGRESS NOTE  Gary [name] QIO:[id number] DOB: March 18, 1945   PCP: Charlane Ferretti, DO  Patient is from: Home.  Independently ambulates at baseline.  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  In ED, slightly tachypneic.  Mild leukocytosis to 10.6 with left shift.  BNP 322.  Serial troponin negative.  INR 1.8.  COVID-19, influenza and RSV PCR nonreactive.  CT angio chest negative for PE but concerning for progressive chronic interstitial lung disease.  Full RVP nonreactive.  Started on IV Solu-Medrol, vancomycin and cefepime.  Pulmonology consulted.     Subjective: Seen and examined earlier this morning.  No major events overnight of this morning.  Feels better but felt short of breath with ambulation to the bathroom this morning.  Still with some productive cough but not a spitting up.  Denies chest pain, GI or UTI symptoms.  Objective: Vitals:   09/17/22 1956 09/18/22 0021 09/18/22 0459 09/18/22 0807  BP: (!) 158/81 (!) 113/47 (!) 145/75 121/64  Pulse: 67 66 60 62  Resp: 19 17 17 19   Temp: 98 F (36.7 C) 97.8 F (36.6 C) 97.7 F (36.5 C) 97.6 F (36.4 C)  TempSrc: Oral Oral Oral Oral  SpO2: 97% 97% 98% 95%  Weight:   104.3 kg     Examination:  GENERAL: No apparent distress.  Nontoxic. HEENT: MMM.  Vision and hearing grossly intact.  NECK: Supple.  No apparent JVD.  RESP: Seems to catch his breath when he speaks.  Fair aeration bilaterally.  On room air. CVS:  RRR. Heart sounds normal.  ABD/GI/GU:  BS+. Abd soft, NTND.  MSK/EXT:  Moves extremities. No apparent deformity. No edema.  SKIN: no apparent skin lesion or wound NEURO: Awake, alert and oriented appropriately.  No apparent focal neuro deficit. PSYCH: Calm. Normal affect.   Procedures:  None  Microbiology summarized: COVID-19, influenza and RSV PCR nonreactive MRSA PCR screen nonreactive  Assessment and plan: Principal Problem:   Acute hypoxemic respiratory failure (HCC) Active Problems:   ILD (interstitial lung disease) (HCC)  Acute on chronic hypoxic respiratory failure likely due to underlying interstitial lung disease versus pneumonia: Patient with known history of ILD an Wegener's granulomatosis.  He is followed by rheumatology.  He is on rituximab and prednisone.  Just had his rituximab few days prior to presentation.  CT angio chest as above.  CRP 6.6.  COVID-19, influenza, RSV and full RVP panel nonreactive. -Appreciate input by pulmonology  Continue supplemental oxygen and pulse oximetry Continue Solu-Medrol at 40 IV every 12.   Check PCT and if it is negative then will pulse Solu-Medrol at 1 g/day for 3 days Will likely need CellCept initiation for NSIP fibrosis and possibly antifibrotic's as an outpatient Continue Rituxan per rheumatology Continue empiric antibiotics Continue pulse oximetry Follow procalcitonin and serology labs  History of CAD/CABG/PCI: Stable. -Continue home medications  A-fib on anticoagulation: Confirmed no missed doses of dofetilide.  QTc is 491.  INR subtherapeutic. -Continue home dofetilide every 12 hours per home dosing.   -Pharmacy to dose warfarin  NIDDM-2 with hyperglycemia: A1c 6.8% in 02/2022. Recent Labs  Lab 09/18/22 0805 09/18/22 1142  GLUCAP 157* 173*  -Increase SSI to moderate -Recheck hemoglobin A1c   OSA: CPAP Aortic aneurysm: Avoid fluoroquinolones. Hypothyroidism: Continue home levothyroxine Mood Disorder: Continue home venlafaxine, hold p.m.  benzodiazepines while inpatient.   Morbid obesity: Elevated BMI with comorbidity as above. Body mass index is 33 kg/m.           DVT prophylaxis:   warfarin (COUMADIN) tablet 6 mg  Code Status: Full code Family Communication: None at bedside Level of care: Telemetry Medical Status is: Inpatient Remains inpatient appropriate because: Acute on chronic hypoxic respiratory failure  Final disposition: Home Consultants:  Pulmonology  55 minutes with more than 50% spent in reviewing records, counseling patient/family and coordinating care.   Sch Meds:  Scheduled Meds:  amLODipine  2.5 mg Oral Q24H   carvedilol  12.5 mg Oral BID   clopidogrel  75 mg Oral Q breakfast   dofetilide  500 mcg Oral BID   famotidine  20 mg Oral BID   ferrous sulfate  325 mg Oral Q24H   furosemide  40 mg Oral Daily   insulin aspart  0-6 Units Subcutaneous TID WC   levothyroxine  137 mcg Oral QAC breakfast   methylPREDNISolone (SOLU-MEDROL) injection  40 mg Intravenous Q12H   pantoprazole  40 mg Oral BID   sodium chloride flush  3 mL Intravenous Q12H   venlafaxine XR  150 mg Oral Q24H   warfarin  6 mg Oral ONCE-1600   Warfarin - Pharmacist Dosing Inpatient   Does not apply q1600   Continuous Infusions:  ceFEPime (MAXIPIME) IV 2 g (09/18/22 0625)   vancomycin     PRN Meds:.acetaminophen, albuterol  Antimicrobials: Anti-infectives (From admission, onward)    Start     Dose/Rate Route Frequency Ordered Stop   09/18/22 1100  vancomycin (VANCOREADY) IVPB 750 mg/150 mL        750 mg 150 mL/hr over 60 Minutes Intravenous Every 12 hours 09/17/22 2224     09/17/22 2245  vancomycin (VANCOREADY) IVPB 1500 mg/300 mL        1,500 mg 150 mL/hr over 120 Minutes Intravenous  Once 09/17/22 2148 09/18/22 0846   09/17/22 2230  ceFEPIme (MAXIPIME) 2 g in sodium chloride 0.9 % 100 mL IVPB        2 g 200 mL/hr over 30 Minutes Intravenous Every 8 hours 09/17/22 2130          I have personally reviewed  the following labs and images: CBC: Recent Labs  Lab 09/17/22 1448 09/17/22 1512 09/18/22 0706  WBC 10.6*  --  10.1  NEUTROABS 8.7*  --   --   HGB 11.8* 12.9* 12.1*  HCT 37.2* 38.0* 39.3  MCV 79.1*  --  80.0  PLT 436*  --  458*   BMP &GFR Recent Labs  Lab 09/17/22 1448 09/17/22 1512 09/18/22 0706  NA 139 141 140  K 4.3 4.2 4.0  CL 105  --  106  CO2 24  --  23  GLUCOSE 107*  --  139*  BUN 16  --  16  CREATININE 0.90  --  0.89  CALCIUM 8.7*  --  8.9  MG  --   --  2.1  PHOS  --   --  2.8   Estimated Creatinine Clearance: 81.4 mL/min (by C-G formula based on SCr of 0.89 mg/dL). Liver & Pancreas: Recent Labs  Lab 09/17/22 1448  AST 28  ALT 18  ALKPHOS 116  BILITOT 0.6  PROT 6.9  ALBUMIN 3.9   No results for input(s): "LIPASE", "AMYLASE" in the last 168 hours. No results for input(s): "AMMONIA" in the last 168 hours. Diabetic: No results for input(s): "HGBA1C" in the last 72 hours. Recent Labs  Lab 09/18/22 0805 09/18/22 1142  GLUCAP 157* 173*   Cardiac Enzymes: No results for input(s): "CKTOTAL", "CKMB", "CKMBINDEX", "TROPONINI" in the last 168 hours. No results for input(s): "PROBNP" in the last 8760 hours. Coagulation Profile: Recent Labs  Lab 09/17/22 1448 09/18/22 0706  INR 1.8* 1.7*   Thyroid Function Tests: No results for input(s): "TSH", "T4TOTAL", "FREET4", "T3FREE", "THYROIDAB" in the last 72 hours. Lipid Profile: No results for input(s): "CHOL", "HDL", "LDLCALC", "TRIG", "CHOLHDL", "LDLDIRECT" in the last 72 hours. Anemia Panel: No results for input(s): "VITAMINB12", "FOLATE", "FERRITIN", "TIBC", "IRON", "RETICCTPCT" in the last 72 hours. Urine analysis:    Component Value Date/Time   COLORURINE YELLOW 09/17/2022 2236   APPEARANCEUR HAZY (A) 09/17/2022 2236   LABSPEC 1.010 09/17/2022 2236   PHURINE 6.5 09/17/2022 2236   GLUCOSEU NEGATIVE 09/17/2022 2236   HGBUR NEGATIVE 09/17/2022 2236   BILIRUBINUR NEGATIVE 09/17/2022 2236    KETONESUR NEGATIVE 09/17/2022 2236   PROTEINUR NEGATIVE 09/17/2022 2236   UROBILINOGEN 0.2 12/24/2011 0805   NITRITE NEGATIVE 09/17/2022 2236   LEUKOCYTESUR SMALL (A) 09/17/2022 2236   Sepsis Labs: Invalid input(s): "PROCALCITONIN", "LACTICIDVEN"  Microbiology: Recent Results (from the past 240 hour(s))  Resp panel by RT-PCR (RSV, Flu A&B, Covid) Anterior Nasal Swab     Status: None   Collection Time: 09/17/22  2:48 PM   Specimen: Anterior Nasal Swab  Result Value Ref Range Status   SARS Coronavirus 2 by RT PCR NEGATIVE NEGATIVE Final    Comment: (NOTE) SARS-CoV-2 target nucleic acids are NOT DETECTED.  The SARS-CoV-2 RNA is generally detectable in upper respiratory specimens during the acute phase of infection. The lowest concentration of SARS-CoV-2 viral copies this assay can detect is 138 copies/mL. A negative result does not preclude SARS-Cov-2 infection and should not be used as the sole basis for treatment or other patient management decisions. A negative result may occur with  improper specimen collection/handling, submission of specimen other than nasopharyngeal swab, presence of viral mutation(s) within the areas targeted by this assay, and inadequate number of viral copies(<138 copies/mL). A negative result must be combined with clinical observations, patient history, and epidemiological information. The expected result is Negative.  Fact Sheet for Patients:  BloggerCourse.com  Fact Sheet for Healthcare Providers:  SeriousBroker.it  This test is no t yet approved or cleared by the [place] FDA and  has been authorized for detection and/or diagnosis of SARS-CoV-2 by FDA under an Emergency Use Authorization (EUA). This EUA will remain  in effect (meaning this test can be used) for the duration of the COVID-19 declaration under Section 564(b)(1) of the Act, 21 U.S.C.section 360bbb-3(b)(1), unless the authorization  is terminated  or revoked sooner.       Influenza A by PCR NEGATIVE NEGATIVE Final   Influenza B by PCR NEGATIVE NEGATIVE Final    Comment: (NOTE) The Xpert Xpress SARS-CoV-2/FLU/RSV plus assay is intended as an aid in the diagnosis of influenza from Nasopharyngeal swab specimens and should not be used as a sole basis for treatment. Nasal washings and aspirates are unacceptable for Xpert Xpress SARS-CoV-2/FLU/RSV testing.  Fact Sheet for Patients: BloggerCourse.com  Fact Sheet for Healthcare Providers: SeriousBroker.it  This test  is not yet approved or cleared by the [place] and has been authorized for detection and/or diagnosis of SARS-CoV-2 by FDA under an Emergency Use Authorization (EUA). This EUA will remain in effect (meaning this test can be used) for the duration of the COVID-19 declaration under Section 564(b)(1) of the Act, 21 U.S.C. section 360bbb-3(b)(1), unless the authorization is terminated or revoked.     Resp Syncytial Virus by PCR NEGATIVE NEGATIVE Final    Comment: (NOTE) Fact Sheet for Patients: BloggerCourse.com  Fact Sheet for Healthcare Providers: SeriousBroker.it  This test is not yet approved or cleared by the [place] FDA and has been authorized for detection and/or diagnosis of SARS-CoV-2 by FDA under an Emergency Use Authorization (EUA). This EUA will remain in effect (meaning this test can be used) for the duration of the COVID-19 declaration under Section 564(b)(1) of the Act, 21 U.S.C. section 360bbb-3(b)(1), unless the authorization is terminated or revoked.  Performed at [facility], [address], [place], [place] [address]   Respiratory (~20 pathogens) panel by PCR     Status: None   Collection Time: 09/17/22 11:18 PM   Specimen: Nasopharyngeal Swab; Respiratory  Result Value Ref Range Status    Adenovirus NOT DETECTED NOT DETECTED Final   Coronavirus 229E NOT DETECTED NOT DETECTED Final    Comment: (NOTE) The Coronavirus on the Respiratory Panel, DOES NOT test for the novel  Coronavirus (2019 nCoV)    Coronavirus HKU1 NOT DETECTED NOT DETECTED Final   Coronavirus NL63 NOT DETECTED NOT DETECTED Final   Coronavirus OC43 NOT DETECTED NOT DETECTED Final   Metapneumovirus NOT DETECTED NOT DETECTED Final   Rhinovirus / Enterovirus NOT DETECTED NOT DETECTED Final   Influenza A NOT DETECTED NOT DETECTED Final   Influenza B NOT DETECTED NOT DETECTED Final   Parainfluenza Virus 1 NOT DETECTED NOT DETECTED Final   Parainfluenza Virus 2 NOT DETECTED NOT DETECTED Final   Parainfluenza Virus 3 NOT DETECTED NOT DETECTED Final   Parainfluenza Virus 4 NOT DETECTED NOT DETECTED Final   Respiratory Syncytial Virus NOT DETECTED NOT DETECTED Final   Bordetella pertussis NOT DETECTED NOT DETECTED Final   Bordetella Parapertussis NOT DETECTED NOT DETECTED Final   Chlamydophila pneumoniae NOT DETECTED NOT DETECTED Final   Mycoplasma pneumoniae NOT DETECTED NOT DETECTED Final    Comment: Performed at [facility], [address] [place], [place] [address]    Radiology Studies: CT Angio Chest PE W/Cm &/Or Wo Cm  Result Date: 09/17/2022 CLINICAL DATA:  Shortness of breath, evaluate for PE EXAM: CT ANGIOGRAPHY CHEST WITH CONTRAST TECHNIQUE: Multidetector CT imaging of the chest was performed using the standard protocol during bolus administration of intravenous contrast. Multiplanar CT image reconstructions and MIPs were obtained to evaluate the vascular anatomy. RADIATION DOSE REDUCTION: This exam was performed according to the departmental dose-optimization program which includes automated exposure control, adjustment of the mA and/or kV according to patient size and/or use of iterative reconstruction technique. CONTRAST:  OMNIPAQUE IOHEXOL 350 MG/ML SOLN COMPARISON:  Chest radiograph dated  09/17/2022 FINDINGS: Cardiovascular: Satisfactory opacification the bilateral pulmonary arteries to the segmental level. No evidence of pulmonary embolism Study is not tailored for evaluation of the thoracic aorta. No evidence of thoracic aortic aneurysm or dissection. Cardiomegaly.  No pericardial effusion. Severe three-vessel coronary atherosclerosis with LAD stents. Mediastinum/Nodes: Small mediastinal nodes, including a dominant 15 mm short axis right hilar node. Visualized thyroid is unremarkable. Lungs/Pleura: Progressive patchy ground-glass opacities in the lungs  bilaterally, lower lobe and subpleural/peripheral predominant, compatible with progressive chronic interstitial lung disease. Associated mild subpleural reticulation/fibrosis. 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 Superimposed mild patchy opacities in the anterior left upper lobe (series 6/image 55), grossly unchanged from the prior, likely reflecting scarring. No focal consolidation. No suspicious pulmonary nodules. Mild paraseptal emphysematous changes in the upper lobes. No pleural effusion or pneumothorax. Upper Abdomen: Visualized upper abdomen is notable for tiny layering gallstones and vascular calcifications. Musculoskeletal: Degenerative changes of the visualized thoracolumbar spine. Median sternotomy. Review of the MIP images confirms the above findings. IMPRESSION: No evidence of pulmonary embolism. Progressive chronic interstitial lung disease, favoring fibrotic NSIP. Aortic Atherosclerosis (ICD10-I70.0) and Emphysema (ICD10-J43.9). Electronically Signed   By: Charline Bills M.D.   On: 09/17/2022 17:13   DG Chest 2 View  Result Date: 09/17/2022 CLINICAL DATA:  Shortness of breath EXAM: CHEST - 2 VIEW COMPARISON:  04/07/2022 FINDINGS: Status post median sternotomy. Stable cardiomegaly. Similar heterogeneous bilateral opacities. No pleural effusion or pneumothorax. No acute  osseous abnormality. IMPRESSION: Cardiomegaly with similar heterogeneous bilateral opacities, which could represent pulmonary edema or infection. Electronically Signed   By: Wiliam Ke M.D.   On: 09/17/2022 16:12      Apryle Stowell T. Dazja Houchin [facility]  If 7PM-7AM, please contact night-coverage [email or web address] 09/18/2022, 11:46 AM

## 2022-09-18 NOTE — Progress Notes (Signed)
ANTICOAGULATION CONSULT NOTE - Initial Consult  Pharmacy Consult for Warfarin Indication: atrial fibrillation  Allergies  Allergen Reactions   Farxiga [Dapagliflozin]     Lightheaded and low blood pressure    Statins Other (See Comments)    Muscle pain    Patient Measurements:    Vital Signs: Temp: 97.8 F (36.6 C) (09/14 0021) Temp Source: Oral (09/14 0021) BP: 113/47 (09/14 0021) Pulse Rate: 66 (09/14 0021)  Labs: Recent Labs    09/17/22 1448 09/17/22 1512 09/17/22 1651  HGB 11.8* 12.9*  --   HCT 37.2* 38.0*  --   PLT 436*  --   --   LABPROT 21.0*  --   --   INR 1.8*  --   --   CREATININE 0.90  --   --   TROPONINIHS 15  --  14    CrCl cannot be calculated (Unknown ideal weight.).   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 M presents with SOB/PNA. Pt on warfarin PTA for afib. Admission INR 1.8. CBC ok on admission. Home dose: 2.5mg  M/W/F and 5mg  all other days (last dose taken 9/13)   Goal of Therapy:  INR 2-3 Monitor platelets by anticoagulation protocol: Yes   Plan:  Daily INR  Christoper Fabian, PharmD, BCPS Please see amion for complete clinical pharmacist phone list 09/18/2022,12:39 AM

## 2022-09-18 NOTE — H&P (Addendum)
History and Physical    Patient: Gary [name]:[id number] DOB: Dec 26, 1945 DOA: 09/17/2022 DOS: the patient was seen and examined on 09/18/2022 PCP: Charlane Ferretti, DO  Patient coming from: Outside Hospital - [facility] ED   Chief Complaint:  Chief Complaint  Patient presents with   Shortness of Breath   HPI: Gary [name] is a 77 y.o. 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 He reports normal state of health until 1 week prior to admission.  Developed relatively sudden onset of dyspnea on exertion, and cough with sputum although unable to characterize.  At his baseline functional status says he is able to go a few 100 yards without stopping for catching his breath.  Now he is only able to go around about 40 or 50 feet on flat ground.  He normally wears 2 L of oxygen at night.  However in the past week has needed to wear 2 L around-the-clock, and has had intermittent desaturations on his home pulse oximeter.  And on occasion had to go up to 4 L.  He recently had rituximab infusion 1 week prior to admission.  Has continued his home prednisone 5 mg daily.  Denies worsening sinus involvement with recent onset of symptoms.  No sick contacts.  No fevers chills.  No recent chest pain.  No lower extremity edema, orthopnea.  Reports recently BNP was downtrending has been off as needed diuretics at home.  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   Past Surgical History:  Procedure Laterality Date   CARDIOVERSION N/A 12/06/2014   Procedure: CARDIOVERSION;  Surgeon: Chilton Si, MD;  Location: [facility] ENDOSCOPY;  Service: Cardiovascular;  Laterality: N/A;   CARDIOVERSION N/A 05/26/2017   Procedure: CARDIOVERSION;  Surgeon: Chilton Si, MD;  Location: [facility] ENDOSCOPY;  Service: Cardiovascular;  Laterality: N/A;   CARDIOVERSION N/A 04/25/2019   Procedure: CARDIOVERSION;  Surgeon: Thurmon Fair, MD;  Location: [facility] ENDOSCOPY;  Service: Cardiovascular;  Laterality: N/A;   CARDIOVERSION N/A 02/26/2022   Procedure: CARDIOVERSION;  Surgeon: Chilton Si, MD;  Location: [facility] ENDOSCOPY;  Service: Cardiovascular;  Laterality: N/A;   CARDIOVERSION N/A 05/13/2022   Procedure: CARDIOVERSION;  Surgeon: Quintella Reichert, MD;  Location: [facility] INVASIVE CV LAB;  Service: Cardiovascular;  Laterality: N/A;   CARPAL TUNNEL RELEASE     CORONARY ANGIOPLASTY     multiple stents   CORONARY ARTERY BYPASS GRAFT  2006   CORONARY STENT INTERVENTION N/A 09/28/2021   Procedure: CORONARY STENT INTERVENTION;  Surgeon: Runell Gess, MD;  Location: [facility] INVASIVE CV LAB;  Service: Cardiovascular;  Laterality: N/A;  SVG-PDA   EYE SURGERY     right eye-  muscular repair  LEFT HEART CATH AND CORS/GRAFTS ANGIOGRAPHY N/A 09/28/2021   Procedure: LEFT HEART CATH AND CORS/GRAFTS ANGIOGRAPHY;  Surgeon: Runell Gess, MD;  Location: [facility] INVASIVE CV LAB;  Service: Cardiovascular;  Laterality: N/A;   TOTAL HIP ARTHROPLASTY  01/03/2012   Procedure: TOTAL HIP ARTHROPLASTY;  Surgeon: Jacki Cones, MD;  Location: [facility] ORS;  Service: Orthopedics;  Laterality: Right;   Social History:  reports that he quit smoking about 38 years ago. His smoking use included cigarettes. He has never used smokeless tobacco. He  reports that he does not drink alcohol and does not use drugs.  Allergies  Allergen Reactions   Farxiga [Dapagliflozin]     Lightheaded and low blood pressure    Statins Other (See Comments)    Muscle pain    Family History  Problem Relation Age of Onset   Heart attack Mother    Other Father        odd stomach disorder   Stroke Maternal Grandmother    Heart attack Maternal Grandfather    Heart attack Paternal Grandmother    Heart attack Paternal Grandfather     Prior to Admission medications   Medication Sig Start Date End Date Taking? Authorizing Provider  ALPRAZolam Prudy Feeler) 0.5 MG tablet Take 0.5 mg by mouth at bedtime. Takes at 11:00pm    [provider]  amLODipine (NORVASC) 2.5 MG tablet Take 2.5 mg by mouth daily. Takes at 6:30am 04/13/22   [provider]  calcium carbonate (OS-CAL) 600 MG TABS tablet Take 600 mg by mouth daily with breakfast.    [provider]  carvedilol (COREG) 25 MG tablet Take 0.5 tablets (12.5 mg total) by mouth 2 (two) times daily. Patient taking differently: Take 12.5 mg by mouth 2 (two) times daily. Takes at 6:30am and 5:30pm 03/06/22   Zannie Cove, MD  Cholecalciferol (VITAMIN D3 PO) Taking 250 mcg by mouth daily Takes at 6:30am    [provider]  clopidogrel (PLAVIX) 75 MG tablet Take 1 tablet (75 mg total) by mouth daily with breakfast. Take every day for 1 year Patient taking differently: Take 75 mg by mouth daily with breakfast. Takes at 4am 09/30/21   Jonita Albee, PA-C  Coenzyme Q10 (CO Q 10 PO) Take 400 mg by mouth daily. Takes at 6:30am    [provider]  dofetilide (TIKOSYN) 500 MCG capsule Take 1 capsule (500 mcg total) by mouth 2 (two) times daily. 06/17/22   Eustace Pen, PA-C  Evolocumab (REPATHA SURECLICK) 140 MG/ML SOAJ INJECT 140 MG INTO THE SKIN EVERY 14 (FOURTEEN) DAYS. INJECT 1 PEN INTO THE FATTY TISSUE SKIN EVERY 14 DAY Patient taking differently: Inject 140 mg into the  skin every 14 (fourteen) days. 05/06/22   Chilton Si, MD  famotidine (PEPCID) 20 MG tablet Take 1 tablet (20 mg total) by mouth 2 (two) times daily. Patient taking differently: Take 20 mg by mouth 2 (two) times daily. Takes at 6:30am and 5:30pm 05/25/22   Eustace Pen, PA-C  ferrous sulfate 325 (65 FE) MG EC tablet Take 325 mg by mouth at bedtime. Takes at 5:30pm    [provider]  furosemide (LASIX) 40 MG tablet Take 1 tablet (40 mg total) by mouth daily as needed. Patient taking differently: Take 40 mg by mouth daily as needed. ONLY as needed 05/11/22   Graciella Freer, PA-C  KLOR-CON M20 20 MEQ tablet TAKE 2 TABLETS BY MOUTH DAILY 07/12/22   Eustace Pen, PA-C  levothyroxine (  SYNTHROID, LEVOTHROID) 137 MCG tablet Take 137 mcg by mouth daily before breakfast. Takes at 4 am 10/27/14   [provider]  loratadine (CLARITIN) 10 MG tablet Take 10 mg by mouth daily. Takes at 6:30am    [provider]  metFORMIN (GLUCOPHAGE) 500 MG tablet Take 500 mg by mouth 2 (two) times daily. Takes at 6:30am and 5:30pm 10/27/17   [provider]  Multiple Vitamin (MULTIVITAMIN WITH MINERALS) TABS tablet Take 1 tablet by mouth daily. Centrum silver men Takes at 6:30am    [provider]  nitroGLYCERIN (NITROSTAT) 0.4 MG SL tablet Place 1 tablet (0.4 mg total) under the tongue every 5 (five) minutes x 3 doses as needed for chest pain. 09/29/21   Jonita Albee, PA-C  pantoprazole (PROTONIX) 40 MG tablet Take 40 mg by mouth 2 (two) times daily. Takes at 6:30am and 5:30pm    [provider]  predniSONE (DELTASONE) 5 MG tablet Take 5 mg by mouth daily with breakfast. Takes at 6:30am    [provider]  riTUXimab (RITUXAN IV) Inject 1 application  into the vein See admin instructions. Take every two weeks and every six months alternating    [provider]  testosterone cypionate (DEPOTESTOTERONE CYPIONATE) 100 MG/ML injection  Inject 100 mg into the muscle every 14 (fourteen) days. 10/09/14   [provider]  venlafaxine XR (EFFEXOR-XR) 150 MG 24 hr capsule Take 150 mg by mouth daily. Takes at 6:30am    [provider]  warfarin (COUMADIN) 5 MG tablet TAKE 1/2 TABLET TO 1 TABLET BY MOUTH DAILY AS DIRECTED BY COUMADIN CLINIC 08/09/22   Chilton Si, MD  Zinc Chelated 22.5 MG TABS Take 22.5 mg by mouth daily. Takes at 6:30am    [provider]    Physical Exam: Vitals:   09/17/22 1530 09/17/22 1814 09/17/22 1956 09/18/22 0021  BP: (!) 144/69  (!) 158/81 (!) 113/47  Pulse: 66  67 66  Resp: 17  19 17   Temp:  98.2 F (36.8 C) 98 F (36.7 C) 97.8 F (36.6 C)  TempSrc:  Oral Oral Oral  SpO2: 98%  97% 97%    Gen: Awake, alert, NAD  CV: Regular, normal S1, S2, no murmurs  Resp: Tachypneic, on 3 L nasal cannula, there are fine rales in the posterior fields, expiratory wheezes. Abd: Flat, normoactive, nontender MSK: Symmetric, no edema  Skin: No rashes or lesions to exposed skin  Neuro: Alert and interactive  Psych: euthymic, appropriate   Data Reviewed:   Reviewed, notable for: VBG pH 7.42/CO2 39  BNP 322, approximately near previous. Troponin 15-> 14 WBC 10 INR 1.8  EKG reviewed Sinus rhythm, LAD, LVH, T wave inversion in aVL, QTc is 491  Imaging:  CT Angio Chest PE W/Cm &/Or Wo Cm  Result Date: 09/17/2022 CLINICAL DATA:  Shortness of breath, evaluate for PE EXAM: CT ANGIOGRAPHY CHEST WITH CONTRAST TECHNIQUE: Multidetector CT imaging of the chest was performed using the standard protocol during bolus administration of intravenous contrast. Multiplanar CT image reconstructions and MIPs were obtained to evaluate the vascular anatomy. RADIATION DOSE REDUCTION: This exam was performed according to the departmental dose-optimization program which includes automated exposure control, adjustment of the mA and/or kV according to patient size and/or use of iterative reconstruction  technique. CONTRAST:  OMNIPAQUE IOHEXOL 350 MG/ML SOLN COMPARISON:  Chest radiograph dated 09/17/2022 FINDINGS: Cardiovascular: Satisfactory opacification the bilateral pulmonary arteries to the segmental level. No evidence of pulmonary embolism Study is not  tailored for evaluation of the thoracic aorta. No evidence of thoracic aortic aneurysm or dissection. Cardiomegaly.  No pericardial effusion. Severe three-vessel coronary atherosclerosis with LAD stents. Mediastinum/Nodes: Small mediastinal nodes, including a dominant 15 mm short axis right hilar node. Visualized thyroid is unremarkable. Lungs/Pleura: Progressive patchy ground-glass opacities in the lungs bilaterally, lower lobe and subpleural/peripheral predominant, compatible with progressive chronic interstitial lung disease. Associated mild subpleural reticulation/fibrosis. 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 Superimposed mild patchy opacities in the anterior left upper lobe (series 6/image 55), grossly unchanged from the prior, likely reflecting scarring. No focal consolidation. No suspicious pulmonary nodules. Mild paraseptal emphysematous changes in the upper lobes. No pleural effusion or pneumothorax. Upper Abdomen: Visualized upper abdomen is notable for tiny layering gallstones and vascular calcifications. Musculoskeletal: Degenerative changes of the visualized thoracolumbar spine. Median sternotomy. Review of the MIP images confirms the above findings. IMPRESSION: No evidence of pulmonary embolism. Progressive chronic interstitial lung disease, favoring fibrotic NSIP. Aortic Atherosclerosis (ICD10-I70.0) and Emphysema (ICD10-J43.9). Electronically Signed   By: Charline Bills M.D.   On: 09/17/2022 17:13   DG Chest 2 View  Result Date: 09/17/2022 CLINICAL DATA:  Shortness of breath EXAM: CHEST - 2 VIEW COMPARISON:  04/07/2022 FINDINGS: Status post median sternotomy. Stable  cardiomegaly. Similar heterogeneous bilateral opacities. No pleural effusion or pneumothorax. No acute osseous abnormality. IMPRESSION: Cardiomegaly with similar heterogeneous bilateral opacities, which could represent pulmonary edema or infection. Electronically Signed   By: Wiliam Ke M.D.   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   AHRF, background CHRF on 2L O2 at night  Possible ILD flare Acute worsening of symptoms x 1 week, possible lower respiratory tract infection symptoms.  CT most consistent with progression of ILD type changes.  Requiring 3 L of oxygen at time of admission.  -Discussed with PCCM PA overnight, pulmonology to see in the morning -Status post methylprednisolone 125 mg IV at outside hospital, continue 40 mg IV twice daily here -Start broad antibiotics with vancomycin pharmacy to dose, cefepime 2 g IV every 8 hours -Check respiratory viral panel, respiratory culture, MRSA nares, ESR, CRP -Doubt significant volume overload, however started on Lasix 40 mg p.o. daily to maintain euvolemia. -Albuterol neb every 4 hours as needed, incentive spirometer, Out of bed to chair. -Will need walk test before eventual discharge  Chronic medical problems: CAD with history CABG PCI, HLD: On DOAC plus Plavix, Repatha outpatient A-fib on anticoagulation: Confirmed no missed doses of dofetilide.  QTc is 491.  Resume dofetilide every 12 hours per home dosing.  Subtherapeutic INR.  Pharmacy to dose warfarin Diabetes: SSI OSA: CPAP Aortic aneurysm: Avoid fluoroquinolones. Hypothyroidism: Continue home levothyroxine Mood Disorder: Continue home venlafaxine, hold p.m. benzodiazepines while  inpatient. Medication management: Attention to Pharm med rec history, patient did not recall medications or dosages, med rec not completed as of note   Advance Care Planning:   Code Status: Full Code ; confirmed with patient  Consults: Pulmonology  Family Communication: No  Severity of Illness: The appropriate patient status for this patient is INPATIENT. Inpatient status is judged to be reasonable and necessary in order to  provide the required intensity of service to ensure the patient's safety. The patient's presenting symptoms, physical exam findings, and initial radiographic and laboratory data in the context of their chronic comorbidities is felt to place them at high risk for further clinical deterioration. Furthermore, it is not anticipated that the patient will be medically stable for discharge from the hospital within 2 midnights of admission.   * I certify that at the point of admission it is my clinical judgment that the patient will require inpatient hospital care spanning beyond 2 midnights from the point of admission due to high intensity of service, high risk for further deterioration and high frequency of surveillance required.*  Author: Dolly Rias, MD 09/18/2022 12:39 AM  For on call review [email or web address].

## 2022-09-18 NOTE — Plan of Care (Signed)
  Problem: Education: Goal: Understanding of cardiac disease, CV risk reduction, and recovery process will improve Outcome: Progressing Goal: Individualized Educational Video(s) Outcome: Progressing   Problem: Activity: Goal: Ability to tolerate increased activity will improve Outcome: Progressing   Problem: Cardiac: Goal: Ability to achieve and maintain adequate cardiovascular perfusion will improve Outcome: Progressing   Problem: Health Behavior/Discharge Planning: Goal: Ability to safely manage health-related needs after discharge will improve Outcome: Progressing   Problem: Education: Goal: Understanding of CV disease, CV risk reduction, and recovery process will improve Outcome: Progressing Goal: Individualized Educational Video(s) Outcome: Progressing   Problem: Activity: Goal: Ability to return to baseline activity level will improve Outcome: Progressing   Problem: Cardiovascular: Goal: Ability to achieve and maintain adequate cardiovascular perfusion will improve Outcome: Progressing Goal: Vascular access site(s) Level 0-1 will be maintained Outcome: Progressing   Problem: Health Behavior/Discharge Planning: Goal: Ability to safely manage health-related needs after discharge will improve Outcome: Progressing   Problem: Education: Goal: Knowledge of cardiac device and self-care will improve Outcome: Progressing Goal: Ability to safely manage health related needs after discharge will improve Outcome: Progressing Goal: Individualized Educational Video(s) Outcome: Progressing   Problem: Cardiac: Goal: Ability to achieve and maintain adequate cardiopulmonary perfusion will improve Outcome: Progressing   Problem: Education: Goal: Knowledge of General Education information will improve Description: Including pain rating scale, medication(s)/side effects and non-pharmacologic comfort measures Outcome: Progressing   Problem: Health Behavior/Discharge  Planning: Goal: Ability to manage health-related needs will improve Outcome: Progressing   Problem: Clinical Measurements: Goal: Ability to maintain clinical measurements within normal limits will improve Outcome: Progressing Goal: Will remain free from infection Outcome: Progressing Goal: Diagnostic test results will improve Outcome: Progressing Goal: Respiratory complications will improve Outcome: Progressing Goal: Cardiovascular complication will be avoided Outcome: Progressing   Problem: Activity: Goal: Risk for activity intolerance will decrease Outcome: Progressing   Problem: Nutrition: Goal: Adequate nutrition will be maintained Outcome: Progressing   Problem: Coping: Goal: Level of anxiety will decrease Outcome: Progressing   Problem: Elimination: Goal: Will not experience complications related to bowel motility Outcome: Progressing Goal: Will not experience complications related to urinary retention Outcome: Progressing   Problem: Pain Managment: Goal: General experience of comfort will improve Outcome: Progressing   Problem: Safety: Goal: Ability to remain free from injury will improve Outcome: Progressing   Problem: Skin Integrity: Goal: Risk for impaired skin integrity will decrease Outcome: Progressing   Problem: Education: Goal: Ability to describe self-care measures that may prevent or decrease complications (Diabetes Survival Skills Education) will improve Outcome: Progressing Goal: Individualized Educational Video(s) Outcome: Progressing   Problem: Coping: Goal: Ability to adjust to condition or change in health will improve Outcome: Progressing   Problem: Fluid Volume: Goal: Ability to maintain a balanced intake and output will improve Outcome: Progressing   Problem: Health Behavior/Discharge Planning: Goal: Ability to identify and utilize available resources and services will improve Outcome: Progressing Goal: Ability to manage  health-related needs will improve Outcome: Progressing   Problem: Metabolic: Goal: Ability to maintain appropriate glucose levels will improve Outcome: Progressing   Problem: Nutritional: Goal: Maintenance of adequate nutrition will improve Outcome: Progressing Goal: Progress toward achieving an optimal weight will improve Outcome: Progressing   Problem: Skin Integrity: Goal: Risk for impaired skin integrity will decrease Outcome: Progressing   Problem: Tissue Perfusion: Goal: Adequacy of tissue perfusion will improve Outcome: Progressing

## 2022-09-18 NOTE — Progress Notes (Addendum)
ANTICOAGULATION CONSULT NOTE   Pharmacy Consult for Warfarin Indication: atrial fibrillation  Allergies  Allergen Reactions   Farxiga [Dapagliflozin]     Lightheaded and low blood pressure    Statins Other (See Comments)    Muscle pain    Patient Measurements: Weight: 104.3 kg (230 lb)  Vital Signs: Temp: 97.7 F (36.5 C) (09/14 0459) Temp Source: Oral (09/14 0459) BP: 145/75 (09/14 0459) Pulse Rate: 60 (09/14 0459)  Labs: Recent Labs    09/17/22 1448 09/17/22 1512 09/17/22 1651 09/18/22 0706  HGB 11.8* 12.9*  --  12.1*  HCT 37.2* 38.0*  --  39.3  PLT 436*  --   --  458*  LABPROT 21.0*  --   --  19.9*  INR 1.8*  --   --  1.7*  CREATININE 0.90  --   --   --   TROPONINIHS 15  --  14  --     Estimated Creatinine Clearance: 80.5 mL/min (by C-G formula based on SCr of 0.9 mg/dL).   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 M presents with SOB/PNA. Pt on warfarin PTA for afib. Admission INR 1.8. CBC ok on admission. Home dose: 2.5mg  M/W/F and 5mg  all other days (last dose taken 9/13).   INR subtherapeutic on admission at 1.8, still subtherapeutic this AM at 1.7. Hgb/plt stable. No signs of bleeding noted    Goal of Therapy:  INR 2-3 Monitor platelets by anticoagulation protocol: Yes   Plan:  Warfarin 6 mg x 1  Monitor INR, CBC, and for s/sx of bleeding  Thank you for involving pharmacy in the patient's  care.   Theotis Burrow, PharmD PGY1 Acute Care Pharmacy Resident  09/18/2022 8:07 AM

## 2022-09-19 DIAGNOSIS — J9601 Acute respiratory failure with hypoxia: Secondary | ICD-10-CM | POA: Diagnosis not present

## 2022-09-19 LAB — CBC
HCT: 39.9 % (ref 39.0–52.0)
Hemoglobin: 12.5 g/dL — ABNORMAL LOW (ref 13.0–17.0)
MCH: 25.5 pg — ABNORMAL LOW (ref 26.0–34.0)
MCHC: 31.3 g/dL (ref 30.0–36.0)
MCV: 81.4 fL (ref 80.0–100.0)
Platelets: 509 10*3/uL — ABNORMAL HIGH (ref 150–400)
RBC: 4.9 MIL/uL (ref 4.22–5.81)
RDW: 17.5 % — ABNORMAL HIGH (ref 11.5–15.5)
WBC: 18.3 10*3/uL — ABNORMAL HIGH (ref 4.0–10.5)
nRBC: 0 % (ref 0.0–0.2)

## 2022-09-19 LAB — COMPREHENSIVE METABOLIC PANEL
ALT: 21 U/L (ref 0–44)
AST: 26 U/L (ref 15–41)
Albumin: 3.4 g/dL — ABNORMAL LOW (ref 3.5–5.0)
Alkaline Phosphatase: 99 U/L (ref 38–126)
Anion gap: 10 (ref 5–15)
BUN: 21 mg/dL (ref 8–23)
CO2: 22 mmol/L (ref 22–32)
Calcium: 8.9 mg/dL (ref 8.9–10.3)
Chloride: 107 mmol/L (ref 98–111)
Creatinine, Ser: 0.96 mg/dL (ref 0.61–1.24)
GFR, Estimated: >60 mL/min (ref >60)
Glucose, Bld: 150 mg/dL — ABNORMAL HIGH (ref 70–99)
Potassium: 3.6 mmol/L (ref 3.5–5.1)
Sodium: 139 mmol/L (ref 135–145)
Total Bilirubin: 0.7 mg/dL (ref 0.3–1.2)
Total Protein: 6.9 g/dL (ref 6.5–8.1)

## 2022-09-19 LAB — PROTIME-INR
INR: 2.1 — ABNORMAL HIGH (ref 0.8–1.2)
Prothrombin Time: 23.4 s — ABNORMAL HIGH (ref 11.4–15.2)

## 2022-09-19 LAB — GLUCOSE, CAPILLARY
Glucose-Capillary: 164 mg/dL — ABNORMAL HIGH (ref 70–99)
Glucose-Capillary: 188 mg/dL — ABNORMAL HIGH (ref 70–99)
Glucose-Capillary: 174 mg/dL — ABNORMAL HIGH (ref 70–99)
Glucose-Capillary: 212 mg/dL — ABNORMAL HIGH (ref 70–99)

## 2022-09-19 LAB — MAGNESIUM: Magnesium: 2.1 mg/dL (ref 1.7–2.4)

## 2022-09-19 MED ORDER — INSULIN GLARGINE-YFGN 100 UNIT/ML ~~LOC~~ SOLN
10.0000 [IU] | Freq: Every day | SUBCUTANEOUS | Status: DC
  Administered 2022-09-19 – 2022-09-22 (×4): 10 [IU] via SUBCUTANEOUS
  Filled 2022-09-19 (×4): qty 0.1

## 2022-09-19 MED ORDER — POTASSIUM CHLORIDE CRYS ER 20 MEQ PO TBCR
40.0000 meq | EXTENDED_RELEASE_TABLET | Freq: Once | ORAL | Status: AC
  Administered 2022-09-19: 40 meq via ORAL
  Filled 2022-09-19: qty 2

## 2022-09-19 MED ORDER — WARFARIN SODIUM 5 MG PO TABS
5.0000 mg | ORAL_TABLET | Freq: Once | ORAL | Status: AC
  Administered 2022-09-19: 5 mg via ORAL
  Filled 2022-09-19: qty 1

## 2022-09-19 NOTE — Progress Notes (Signed)
PROGRESS NOTE  Gary [name] MVH:[id number] DOB: 01/29/1945   PCP: Charlane Ferretti, DO  Patient is from: Home.  Independently ambulates at baseline.  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  In ED, slightly tachypneic.  Mild leukocytosis to 10.6 with left shift.  BNP 322.  Serial troponin negative.  INR 1.8.  COVID-19, influenza and RSV PCR nonreactive.  CT angio chest negative for PE but concerning for progressive chronic interstitial lung disease.  Full RVP nonreactive.  Started on IV Solu-Medrol, vancomycin and cefepime.  Pulmonology consulted and started high-dose Solu-Medrol..     Subjective: Seen and examined earlier this morning.  No major events overnight of this morning.  Continues to endorse dyspnea with exertion and some productive cough..  Objective: Vitals:   09/19/22 0030 09/19/22 0342 09/19/22 0500 09/19/22 0735  BP: (!) 157/71 (!) 145/73  (!) 144/67  Pulse: 60 62  66  Resp: 17 18  17   Temp: (!) 97.5 F (36.4 C) (!) 97.5 F (36.4 C)  (!) 97.4 F (36.3 C)  TempSrc: Oral Oral  Oral  SpO2: 96% 95%  95%  Weight:   104.3 kg     Examination:  GENERAL: No apparent distress.  Nontoxic. HEENT: MMM.  Vision and hearing grossly intact.  NECK: Supple.  No apparent JVD.  RESP: Seems to catch his breath when he speaks.  Fair aeration bilaterally.  On room air. CVS:  RRR. Heart sounds normal.  ABD/GI/GU: BS+. Abd soft, NTND.  MSK/EXT:  Moves extremities. No apparent deformity. No edema.  SKIN: no  apparent skin lesion or wound NEURO: Awake, alert and oriented appropriately.  No apparent focal neuro deficit. PSYCH: Calm. Normal affect.   Procedures:  None  Microbiology summarized: COVID-19, influenza and RSV PCR nonreactive MRSA PCR screen nonreactive  Assessment and plan: Principal Problem:   Acute hypoxemic respiratory failure (HCC) Active Problems:   ILD (interstitial lung disease) (HCC)  Acute on chronic hypoxic respiratory failure likely due to underlying interstitial lung disease versus pneumonia: Patient with known history of ILD an Wegener's granulomatosis.  He is followed by rheumatology.  He is on rituximab and prednisone.  Just had his rituximab few days prior to presentation.  CT angio chest as above.  Pro-Cal negative.  CRP 6.6.  COVID-19, influenza, RSV and full RVP panel nonreactive. -Appreciate input by pulmonology  Continue supplemental oxygen and pulse oximetry Continue steroids 1 g/day  Check PCT and if it is negative then will pulse Solu-Medrol at 1 g/day for 3 days Will likely need CellCept initiation for NSIP fibrosis and possibly antifibrotic's as an outpatient Continue Rituxan per rheumatology Continue empiric antibiotics Continue pulse oximetry Follow serologies for CTD  History of CAD/CABG/PCI: Stable. -Continue home medications  A-fib on anticoagulation: Confirmed no missed doses of dofetilide.  QTc is 491.  INR 2.1. -Continue home dofetilide every 12 hours per home dosing.   -Pharmacy to dose warfarin  NIDDM-2 with hyperglycemia: A1c 6.8% in 02/2022. Recent Labs  Lab 09/18/22 0805 09/18/22 1142 09/18/22 1638 09/18/22 1934 09/19/22  0736  GLUCAP 157* 173* 162* 182* 164*  -Continue SSI-moderate -Add Semglee 10 units daily -Follow A1c.   OSA on CPAP-not comfortable with hospital CPAP.  Patient to bring home CPAP Aortic aneurysm: Avoid fluoroquinolones. Hypothyroidism: Continue home levothyroxine Mood Disorder: Continue home venlafaxine,  hold p.m. benzodiazepines while inpatient.   Morbid obesity: Elevated BMI with comorbidity as above. Body mass index is 33 kg/m.           DVT prophylaxis:   warfarin (COUMADIN) tablet 5 mg  Code Status: Full code Family Communication: None at bedside Level of care: Telemetry Medical Status is: Inpatient Remains inpatient appropriate because: Acute on chronic hypoxic respiratory failure  Final disposition: Home Consultants:  Pulmonology  35 minutes with more than 50% spent in reviewing records, counseling patient/family and coordinating care.   Sch Meds:  Scheduled Meds:  amLODipine  2.5 mg Oral Q24H   carvedilol  12.5 mg Oral BID   clopidogrel  75 mg Oral Q breakfast   dofetilide  500 mcg Oral BID   famotidine  20 mg Oral BID   ferrous sulfate  325 mg Oral Q24H   furosemide  40 mg Oral Daily   insulin aspart  0-15 Units Subcutaneous TID WC   insulin aspart  0-5 Units Subcutaneous QHS   insulin glargine-yfgn  10 Units Subcutaneous Daily   levothyroxine  137 mcg Oral QAC breakfast   pantoprazole  40 mg Oral BID   potassium chloride  40 mEq Oral Once   sodium chloride flush  3 mL Intravenous Q12H   venlafaxine XR  150 mg Oral Q24H   warfarin  5 mg Oral ONCE-1600   Warfarin - Pharmacist Dosing Inpatient   Does not apply q1600   Continuous Infusions:  ceFEPime (MAXIPIME) IV 2 g (09/19/22 0609)   methylPREDNISolone (SOLU-MEDROL) injection 500 mg (09/19/22 0647)   vancomycin Stopped (09/18/22 2246)   PRN Meds:.acetaminophen, albuterol  Antimicrobials: Anti-infectives (From admission, onward)    Start     Dose/Rate Route Frequency Ordered Stop   09/18/22 1100  vancomycin (VANCOREADY) IVPB 750 mg/150 mL        750 mg 150 mL/hr over 60 Minutes Intravenous Every 12 hours 09/17/22 2224     09/17/22 2245  vancomycin (VANCOREADY) IVPB 1500 mg/300 mL        1,500 mg 150 mL/hr over 120 Minutes Intravenous  Once 09/17/22 2148 09/18/22 0846   09/17/22 2230  ceFEPIme  (MAXIPIME) 2 g in sodium chloride 0.9 % 100 mL IVPB        2 g 200 mL/hr over 30 Minutes Intravenous Every 8 hours 09/17/22 2130          I have personally reviewed the following labs and images: CBC: Recent Labs  Lab 09/17/22 1448 09/17/22 1512 09/18/22 0706 09/19/22 0741  WBC 10.6*  --  10.1 18.3*  NEUTROABS 8.7*  --   --   --   HGB 11.8* 12.9* 12.1* 12.5*  HCT 37.2* 38.0* 39.3 39.9  MCV 79.1*  --  80.0 81.4  PLT 436*  --  458* 509*   BMP &GFR Recent Labs  Lab 09/17/22 1448 09/17/22 1512 09/18/22 0706 09/19/22 0741  NA 139 141 140 139  K 4.3 4.2 4.0 3.6  CL 105  --  106 107  CO2 24  --  23 22  GLUCOSE 107*  --  139* 150*  BUN 16  --  16 21  CREATININE 0.90  --  0.89 0.96  CALCIUM 8.7*  --  8.9 8.9  MG  --   --  2.1 2.1  PHOS  --   --  2.8  --    Estimated Creatinine Clearance: 75.5 mL/min (by C-G formula based on SCr of 0.96 mg/dL). Liver & Pancreas: Recent Labs  Lab 09/17/22 1448 09/19/22 0741  AST 28 26  ALT 18 21  ALKPHOS 116 99  BILITOT 0.6 0.7  PROT 6.9 6.9  ALBUMIN 3.9 3.4*   No results for input(s): "LIPASE", "AMYLASE" in the last 168 hours. No results for input(s): "AMMONIA" in the last 168 hours. Diabetic: No results for input(s): "HGBA1C" in the last 72 hours. Recent Labs  Lab 09/18/22 0805 09/18/22 1142 09/18/22 1638 09/18/22 1934 09/19/22 0736  GLUCAP 157* 173* 162* 182* 164*   Cardiac Enzymes: No results for input(s): "CKTOTAL", "CKMB", "CKMBINDEX", "TROPONINI" in the last 168 hours. No results for input(s): "PROBNP" in the last 8760 hours. Coagulation Profile: Recent Labs  Lab 09/17/22 1448 09/18/22 0706 09/19/22 0741  INR 1.8* 1.7* 2.1*   Thyroid Function Tests: No results for input(s): "TSH", "T4TOTAL", "FREET4", "T3FREE", "THYROIDAB" in the last 72 hours. Lipid Profile: No results for input(s): "CHOL", "HDL", "LDLCALC", "TRIG", "CHOLHDL", "LDLDIRECT" in the last 72 hours. Anemia Panel: No results for input(s):  "VITAMINB12", "FOLATE", "FERRITIN", "TIBC", "IRON", "RETICCTPCT" in the last 72 hours. Urine analysis:    Component Value Date/Time   COLORURINE YELLOW 09/17/2022 2236   APPEARANCEUR HAZY (A) 09/17/2022 2236   LABSPEC 1.010 09/17/2022 2236   PHURINE 6.5 09/17/2022 2236   GLUCOSEU NEGATIVE 09/17/2022 2236   HGBUR NEGATIVE 09/17/2022 2236   BILIRUBINUR NEGATIVE 09/17/2022 2236   KETONESUR NEGATIVE 09/17/2022 2236   PROTEINUR NEGATIVE 09/17/2022 2236   UROBILINOGEN 0.2 12/24/2011 0805   NITRITE NEGATIVE 09/17/2022 2236   LEUKOCYTESUR SMALL (A) 09/17/2022 2236   Sepsis Labs: Invalid input(s): "PROCALCITONIN", "LACTICIDVEN"  Microbiology: Recent Results (from the past 240 hour(s))  Resp panel by RT-PCR (RSV, Flu A&B, Covid) Anterior Nasal Swab     Status: None   Collection Time: 09/17/22  2:48 PM   Specimen: Anterior Nasal Swab  Result Value Ref Range Status   SARS Coronavirus 2 by RT PCR NEGATIVE NEGATIVE Final    Comment: (NOTE) SARS-CoV-2 target nucleic acids are NOT DETECTED.  The SARS-CoV-2 RNA is generally detectable in upper respiratory specimens during the acute phase of infection. The lowest concentration of SARS-CoV-2 viral copies this assay can detect is 138 copies/mL. A negative result does not preclude SARS-Cov-2 infection and should not be used as the sole basis for treatment or other patient management decisions. A negative result may occur with  improper specimen collection/handling, submission of specimen other than nasopharyngeal swab, presence of viral mutation(s) within the areas targeted by this assay, and inadequate number of viral copies(<138 copies/mL). A negative result must be combined with clinical observations, patient history, and epidemiological information. The expected result is Negative.  Fact Sheet for Patients:  BloggerCourse.com  Fact Sheet for Healthcare Providers:  SeriousBroker.it  This  test is no t yet approved or cleared by the [place] FDA and  has been authorized for detection and/or diagnosis of SARS-CoV-2 by FDA under an Emergency Use Authorization (EUA). This EUA will remain  in effect (meaning this test can be used) for the duration of the COVID-19 declaration under Section 564(b)(1) of the Act, 21 U.S.C.section 360bbb-3(b)(1), unless the authorization is terminated  or revoked sooner.       Influenza A by PCR NEGATIVE NEGATIVE Final  Influenza B by PCR NEGATIVE NEGATIVE Final    Comment: (NOTE) The Xpert Xpress SARS-CoV-2/FLU/RSV plus assay is intended as an aid in the diagnosis of influenza from Nasopharyngeal swab specimens and should not be used as a sole basis for treatment. Nasal washings and aspirates are unacceptable for Xpert Xpress SARS-CoV-2/FLU/RSV testing.  Fact Sheet for Patients: BloggerCourse.com  Fact Sheet for Healthcare Providers: SeriousBroker.it  This test is not yet approved or cleared by the [place] FDA and has been authorized for detection and/or diagnosis of SARS-CoV-2 by FDA under an Emergency Use Authorization (EUA). This EUA will remain in effect (meaning this test can be used) for the duration of the COVID-19 declaration under Section 564(b)(1) of the Act, 21 U.S.C. section 360bbb-3(b)(1), unless the authorization is terminated or revoked.     Resp Syncytial Virus by PCR NEGATIVE NEGATIVE Final    Comment: (NOTE) Fact Sheet for Patients: BloggerCourse.com  Fact Sheet for Healthcare Providers: SeriousBroker.it  This test is not yet approved or cleared by the [place] FDA and has been authorized for detection and/or diagnosis of SARS-CoV-2 by FDA under an Emergency Use Authorization (EUA). This EUA will remain in effect (meaning this test can be used) for the duration of the COVID-19 declaration under  Section 564(b)(1) of the Act, 21 U.S.C. section 360bbb-3(b)(1), unless the authorization is terminated or revoked.  Performed at [facility], [address], [place], [place] [address]   MRSA culture     Status: None (Preliminary result)   Collection Time: 09/17/22  9:31 PM   Specimen: Nasal Mucosa; Body Fluid  Result Value Ref Range Status   Specimen Description NASOPHARYNGEAL  Final   Special Requests NONE  Final   Culture   Final    CULTURE REINCUBATED FOR BETTER GROWTH Performed at [facility], [address] [place], [place] [address]    Report Status PENDING  Incomplete  Respiratory (~20 pathogens) panel by PCR     Status: None   Collection Time: 09/17/22 11:18 PM   Specimen: Nasopharyngeal Swab; Respiratory  Result Value Ref Range Status   Adenovirus NOT DETECTED NOT DETECTED Final   Coronavirus 229E NOT DETECTED NOT DETECTED Final    Comment: (NOTE) The Coronavirus on the Respiratory Panel, DOES NOT test for the novel  Coronavirus (2019 nCoV)    Coronavirus HKU1 NOT DETECTED NOT DETECTED Final   Coronavirus NL63 NOT DETECTED NOT DETECTED Final   Coronavirus OC43 NOT DETECTED NOT DETECTED Final   Metapneumovirus NOT DETECTED NOT DETECTED Final   Rhinovirus / Enterovirus NOT DETECTED NOT DETECTED Final   Influenza A NOT DETECTED NOT DETECTED Final   Influenza B NOT DETECTED NOT DETECTED Final   Parainfluenza Virus 1 NOT DETECTED NOT DETECTED Final   Parainfluenza Virus 2 NOT DETECTED NOT DETECTED Final   Parainfluenza Virus 3 NOT DETECTED NOT DETECTED Final   Parainfluenza Virus 4 NOT DETECTED NOT DETECTED Final   Respiratory Syncytial Virus NOT DETECTED NOT DETECTED Final   Bordetella pertussis NOT DETECTED NOT DETECTED Final   Bordetella Parapertussis NOT DETECTED NOT DETECTED Final   Chlamydophila pneumoniae NOT DETECTED NOT DETECTED Final   Mycoplasma pneumoniae NOT DETECTED NOT DETECTED Final    Comment: Performed at [facility], [address] [place], [place] [address]    Radiology Studies: No results found.    Jamarie Mussa T. Modelle Vollmer [facility]  If 7PM-7AM, please contact night-coverage [email or web address] 09/19/2022, 11:42 AM

## 2022-09-19 NOTE — Plan of Care (Signed)
  Problem: Education: Goal: Understanding of cardiac disease, CV risk reduction, and recovery process will improve Outcome: Progressing Goal: Individualized Educational Video(s) Outcome: Progressing   Problem: Activity: Goal: Ability to tolerate increased activity will improve Outcome: Progressing   Problem: Cardiac: Goal: Ability to achieve and maintain adequate cardiovascular perfusion will improve Outcome: Progressing   Problem: Health Behavior/Discharge Planning: Goal: Ability to safely manage health-related needs after discharge will improve Outcome: Progressing   Problem: Education: Goal: Understanding of CV disease, CV risk reduction, and recovery process will improve Outcome: Progressing Goal: Individualized Educational Video(s) Outcome: Progressing   Problem: Activity: Goal: Ability to return to baseline activity level will improve Outcome: Progressing   Problem: Cardiovascular: Goal: Ability to achieve and maintain adequate cardiovascular perfusion will improve Outcome: Progressing Goal: Vascular access site(s) Level 0-1 will be maintained Outcome: Progressing   Problem: Health Behavior/Discharge Planning: Goal: Ability to safely manage health-related needs after discharge will improve Outcome: Progressing   Problem: Education: Goal: Knowledge of cardiac device and self-care will improve Outcome: Progressing Goal: Ability to safely manage health related needs after discharge will improve Outcome: Progressing Goal: Individualized Educational Video(s) Outcome: Progressing   Problem: Cardiac: Goal: Ability to achieve and maintain adequate cardiopulmonary perfusion will improve Outcome: Progressing   Problem: Education: Goal: Knowledge of General Education information will improve Description: Including pain rating scale, medication(s)/side effects and non-pharmacologic comfort measures Outcome: Progressing   Problem: Health Behavior/Discharge  Planning: Goal: Ability to manage health-related needs will improve Outcome: Progressing   Problem: Clinical Measurements: Goal: Ability to maintain clinical measurements within normal limits will improve Outcome: Progressing Goal: Will remain free from infection Outcome: Progressing Goal: Diagnostic test results will improve Outcome: Progressing Goal: Respiratory complications will improve Outcome: Progressing Goal: Cardiovascular complication will be avoided Outcome: Progressing   Problem: Activity: Goal: Risk for activity intolerance will decrease Outcome: Progressing   Problem: Nutrition: Goal: Adequate nutrition will be maintained Outcome: Progressing   Problem: Coping: Goal: Level of anxiety will decrease Outcome: Progressing   Problem: Elimination: Goal: Will not experience complications related to bowel motility Outcome: Progressing Goal: Will not experience complications related to urinary retention Outcome: Progressing   Problem: Pain Managment: Goal: General experience of comfort will improve Outcome: Progressing   Problem: Safety: Goal: Ability to remain free from injury will improve Outcome: Progressing   Problem: Skin Integrity: Goal: Risk for impaired skin integrity will decrease Outcome: Progressing   Problem: Education: Goal: Ability to describe self-care measures that may prevent or decrease complications (Diabetes Survival Skills Education) will improve Outcome: Progressing Goal: Individualized Educational Video(s) Outcome: Progressing   Problem: Coping: Goal: Ability to adjust to condition or change in health will improve Outcome: Progressing   Problem: Fluid Volume: Goal: Ability to maintain a balanced intake and output will improve Outcome: Progressing   Problem: Health Behavior/Discharge Planning: Goal: Ability to identify and utilize available resources and services will improve Outcome: Progressing Goal: Ability to manage  health-related needs will improve Outcome: Progressing   Problem: Metabolic: Goal: Ability to maintain appropriate glucose levels will improve Outcome: Progressing   Problem: Nutritional: Goal: Maintenance of adequate nutrition will improve Outcome: Progressing Goal: Progress toward achieving an optimal weight will improve Outcome: Progressing   Problem: Skin Integrity: Goal: Risk for impaired skin integrity will decrease Outcome: Progressing   Problem: Tissue Perfusion: Goal: Adequacy of tissue perfusion will improve Outcome: Progressing

## 2022-09-19 NOTE — Progress Notes (Addendum)
NAME:  Gary [name], MRN:  [id number], DOB:  1945/05/27, LOS: 2 ADMISSION DATE:  09/17/2022, CONSULTATION DATE:  9/13 REFERRING MD:  Lazarus Salines, CHIEF COMPLAINT:  ILD flare    History of Present Illness:  77 year old male who presented to the emergency room on 9/13 with chief complaint of 3-day history of worsening shortness of breath.  Has oxygen at home, but typically does not use or needed.  Noting pulse oximetry to drop into the 80s with any exertion, progressed to the point where he was short of breath just talking.  Did endorse mildly productive cough worse than his baseline chronic cough.  No fever chills or chest discomfort no nausea or vomiting no sick exposure.  COVID test outpatient was negative ER evaluation: CT chest was negative for pulmonary emboli.  There was progressive bilateral groundglass opacities throughout both lungs much worse than when comparing CT imaging 6 months ago BNP was 322, troponin I negative. He was placed on supplemental oxygen, pulse IV steroids, and broad-spectrum antibiotics. Pulmonary asked to evaluate for further recommendations on management  He has history of Wegener's diagnosed 5 years ago.  He has been on Rituxan for the past 5 years and chronic prednisone at 5 mg [initially on 60 mg of prednisone].  The Rituxan dose was increased 1 year ago due to poor control of symptoms  He used to run a Administrator, sports.  Quit smoking at the age of 56.  No significant exposures, travel history.  No family history of lung disease.  He was started on portable oxygen through the CPAP and with exertion 6 months ago during hospitalization for pneumonia.  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  States that breathing is slightly better  Objective   Blood pressure (!) 144/67, pulse 66, temperature (!) 97.4 F (36.3 C), temperature source Oral, resp. rate 17, weight 104.3 kg, SpO2 95%.        Intake/Output Summary (Last 24 hours) at 09/19/2022 0950 Last data filed at 09/19/2022 0458 Gross per 24 hour  Intake 755.61 ml  Output --  Net 755.61 ml   Filed Weights   09/18/22 0459 09/19/22 0500  Weight: 104.3 kg 104.3 kg    Examination: Gen:      No acute distress HEENT:  EOMI, sclera anicteric Neck:     No masses; no thyromegaly Lungs:    Mild scattered crackle CV:         Regular rate and rhythm; no murmurs Abd:      + bowel sounds; soft, non-tender; no palpable masses, no distension Ext:    No edema; adequate peripheral perfusion Skin:      Warm and dry; no rash Neuro: alert and oriented x 3 Psych: normal mood and affect   Lab/imaging reviewed Significant for WBC 10.1, hemoglobin 12.1, platelets 458 CTA 09/17/2022-progressive patchy groundglass opacities bilaterally with underlying fibrosis.  Resolved Hospital Problem list     Assessment & Plan:   Acute hypoxic respiratory failure Wegener's granulomatosis ILD HFrEF  CAF on warfarin Coronary artery disease Hyperlipidemia Type 2 diabetes Hypertension OSA PAD  Pulmonary problem  list Acute hypoxic respiratory failure secondary to progressive groundglass bilateral opacities.  Differential diagnosis includes flare of known underlying ILD.  Infection is less likely as his viral panel, PCT is negative.  In retrospect his episode of pneumonia in February 2024 may have been an ILD flare.  Now he has extensive NSIP opacities and likely some underlying fibrosis  Plan/rec Continue supplemental oxygen CTD serologies are  pending Started pulse dose steroids of 1 g/day for 3 days on 9/14.  Then transition to prednisone with slow taper Will likely need CellCept initiation for NSIP fibrosis and possibly antifibrotic's as an outpatient.  Will arrange follow-up in ILD clinic Continue Rituxan per rheumatology.  His next dose of Rituxan is scheduled on Thursday as an outpatient and he hopes to be discharged by then to be able to get the therapy.  Best Practice (right click and "Reselect all SmartList Selections" daily)   Per primary  Signature:    Chilton Greathouse MD [facility] See Amion for pager  If no response to pager , please call [phone] until 7pm After 7:00 pm call Elink  [phone] 09/19/2022, 9:50 AM

## 2022-09-19 NOTE — Progress Notes (Signed)
Pt unable to wear our CPAP due to mask . Will try to bring home unit tomorrow

## 2022-09-19 NOTE — Progress Notes (Signed)
ANTICOAGULATION CONSULT NOTE   Pharmacy Consult for Warfarin Indication: atrial fibrillation  Allergies  Allergen Reactions   Farxiga [Dapagliflozin] Other (See Comments)    Lightheadedness Low blood pressure   Statins Other (See Comments)    Myalgias     Patient Measurements: Weight: 104.3 kg (230 lb)  Vital Signs: Temp: 97.4 F (36.3 C) (09/15 0735) Temp Source: Oral (09/15 0735) BP: 144/67 (09/15 0735) Pulse Rate: 66 (09/15 0735)  Labs: Recent Labs    09/17/22 1448 09/17/22 1512 09/17/22 1651 09/18/22 0706 09/19/22 0741  HGB 11.8* 12.9*  --  12.1* 12.5*  HCT 37.2* 38.0*  --  39.3 39.9  PLT 436*  --   --  458* 509*  LABPROT 21.0*  --   --  19.9* 23.4*  INR 1.8*  --   --  1.7* 2.1*  CREATININE 0.90  --   --  0.89 0.96  TROPONINIHS 15  --  14  --   --     Estimated Creatinine Clearance: 75.5 mL/min (by C-G formula based on SCr of 0.96 mg/dL).   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 M presents with SOB/PNA. Pt on warfarin PTA for afib. Admission INR 1.8. CBC ok on admission. Home dose: 2.5mg  M/W/F and 5mg  all other days (last dose taken 9/13).   INR therapeutic at 2.1. Hgb/plt stable. No signs of bleeding noted.    Goal of Therapy:  INR 2-3 Monitor platelets by anticoagulation protocol: Yes   Plan:  Warfarin 5 mg x 1  Monitor INR, CBC, and for s/sx of bleeding  Thank you for involving  pharmacy in the patient's care.   Theotis Burrow, PharmD PGY1 Acute Care Pharmacy Resident  09/19/2022 11:12 AM

## 2022-09-20 DIAGNOSIS — J9601 Acute respiratory failure with hypoxia: Secondary | ICD-10-CM | POA: Diagnosis not present

## 2022-09-20 LAB — MRSA CULTURE: Culture: NOT DETECTED

## 2022-09-20 LAB — CBC
HCT: 39.2 % (ref 39.0–52.0)
Hemoglobin: 12 g/dL — ABNORMAL LOW (ref 13.0–17.0)
MCH: 24.6 pg — ABNORMAL LOW (ref 26.0–34.0)
MCHC: 30.6 g/dL (ref 30.0–36.0)
MCV: 80.3 fL (ref 80.0–100.0)
Platelets: 507 10*3/uL — ABNORMAL HIGH (ref 150–400)
RBC: 4.88 MIL/uL (ref 4.22–5.81)
RDW: 17.5 % — ABNORMAL HIGH (ref 11.5–15.5)
WBC: 18.2 10*3/uL — ABNORMAL HIGH (ref 4.0–10.5)
nRBC: 0 % (ref 0.0–0.2)

## 2022-09-20 LAB — HEMOGLOBIN A1C
Hgb A1c MFr Bld: 6.5 % — ABNORMAL HIGH (ref 4.8–5.6)
Mean Plasma Glucose: 140 mg/dL

## 2022-09-20 LAB — GLUCOSE, CAPILLARY
Glucose-Capillary: 157 mg/dL — ABNORMAL HIGH (ref 70–99)
Glucose-Capillary: 209 mg/dL — ABNORMAL HIGH (ref 70–99)
Glucose-Capillary: 171 mg/dL — ABNORMAL HIGH (ref 70–99)
Glucose-Capillary: 164 mg/dL — ABNORMAL HIGH (ref 70–99)

## 2022-09-20 LAB — CYCLIC CITRUL PEPTIDE ANTIBODY, IGG/IGA: CCP Antibodies IgG/IgA: 9 U (ref 0–19)

## 2022-09-20 LAB — MRSA NEXT GEN BY PCR, NASAL: MRSA by PCR Next Gen: NOT DETECTED

## 2022-09-20 LAB — PROTIME-INR
INR: 2.9 — ABNORMAL HIGH (ref 0.8–1.2)
Prothrombin Time: 30.8 s — ABNORMAL HIGH (ref 11.4–15.2)

## 2022-09-20 MED ORDER — WARFARIN SODIUM 1 MG PO TABS
1.0000 mg | ORAL_TABLET | Freq: Once | ORAL | Status: AC
  Administered 2022-09-20: 1 mg via ORAL
  Filled 2022-09-20: qty 1

## 2022-09-20 NOTE — Progress Notes (Signed)
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   In ED, slightly tachypneic.  Mild leukocytosis to 10.6 with left shift.  BNP 322.  Serial troponin negative.  INR 1.8.  COVID-19, influenza and RSV PCR nonreactive.  CT angio chest negative for PE but concerning for progressive chronic interstitial lung disease.  Full RVP nonreactive.  Started on IV, vancomycin and cefepime.  Pulmonology consulted and started high-dose Solu-Medrol for acute hypoxic aspiratory failure secondary to progressive groundglass bilateral opacities concerning for underlying ILD flare up workup unremarkable for viral panel procalcitonin negative less likely infectious etiology.    Subjective: Seen and examined Feels overall better- using restroom and felt lack of oxygen when he came back too room just now Breathing and cough improving Overnight afebrile he is on nocturnal CPAP and other time on 2 L nasal cannula. At home on prn o2. Labs showed leukocytosis is expected with high-dose steroid, stable renal function LFTs LITES  Assessment and Plan: Principal Problem:   Acute hypoxemic respiratory failure (HCC) Active Problems:   ILD (interstitial lung disease) (HCC)  Acute hypoxic respiratory failure due to progressive groundglass bilateral opacities  Differentials: Acute flareup of underlying ILD, less likely infectious etiology given procalcitonin negative viral panel is negative. he is followed by rheumatology.  He is on rituximab and prednisone>Just had his rituximab few days  prior and is due on Thursday.  Appreciate pulmonary input on board continue POLST IV steroid follow milligram until 9/17 then switched to 60 mg prednisone w/ bactrim prophylaxis, will likely need CellCept  for NSIP fibrosis and possibly antifibrotic as an outpatient and pulmonary will arrange follow-up in ILD clinic. Continue empiric antibiotics, CTD serologies pending.  Continue Rituxan per rheumatology.   History of CAD/CABG/PCI:  No chest pain, continue home Coreg, Plavix, Coumadin.    Htn: BP well-controlled on amlodipine, Coreg, lasix.  A-fib: Continue patient's debilitated, monitor QTc INR, pharmacy dosing.   Recent Labs  Lab 09/17/22 1448 09/18/22 0706 09/19/22 0741 09/20/22 0820  INR 1.8* 1.7* 2.1* 2.9*    NIDDM-2 with hyperglycemia:  Blood sugars well-controlled.  A1c 6.8% in 02/2022.Cont semeglee 10 u and ssi Recent Labs  Lab 09/19/22 0736 09/19/22 1219 09/19/22 1517 09/19/22 2002 09/20/22 0844  GLUCAP 164* 188* 174* 212* 157*    OSA on CPAP: Continue CPAP at bedtime.    Aortic aneurysm: Avoid fluoroquinolones.  Hypothyroidism: Continue home levothyroxine  Mood Disorder: Mood stable on home venlafaxine, hold p.m. benzodiazepines while inpatient.   Class 1 Obesity:Patient's Body mass index is 33.15 kg/m. : Will benefit with PCP follow-up, weight loss  healthy lifestyle and outpatient sleep evaluation.  DVT prophylaxis: On Coumadin Code Status:   Code Status: Full Code Family Communication: plan of care discussed with patient at bedside. Patient status is: Inpatient because of respiratory failure Level of care: Telemetry Medical   Dispo: The patient is from: Home            Anticipated disposition: TBD  Objective: Vitals last 24 hrs: Vitals:   09/20/22 0022 09/20/22 0500 09/20/22 0502 09/20/22 0845  BP: 135/67  (!) 141/81 (!) 142/84  Pulse: 65  65 60  Resp: 18  18 15   Temp: 97.9 F (36.6 C)  97.8 F (36.6 C) (!) 97.1 F (36.2 C)  TempSrc: Oral   Oral Oral  SpO2: 91%  94% 96%  Weight:  104.8 kg     Weight change: 0.473 kg  Physical Examination: General exam: alert awake, older than stated age HEENT:Oral mucosa moist, Ear/Nose WNL grossly Respiratory system: bilaterally clear BS, no use of accessory muscle Cardiovascular system: S1 & S2 +, No JVD. Gastrointestinal system: Abdomen soft,NT,ND, BS+ Nervous System:Alert, awake, moving extremities. Extremities: LE edema neg,distal peripheral pulses palpable.  Skin: No rashes,no icterus. MSK: Normal muscle bulk,tone, power  Medications reviewed:  Scheduled Meds:  amLODipine  2.5 mg Oral Q24H   carvedilol  12.5 mg Oral BID   clopidogrel  75 mg Oral Q breakfast   dofetilide  500 mcg Oral BID   famotidine  20 mg Oral BID   ferrous sulfate  325 mg Oral Q24H   furosemide  40 mg Oral Daily   insulin aspart  0-15 Units Subcutaneous TID WC   insulin aspart  0-5 Units Subcutaneous QHS   insulin glargine-yfgn  10 Units Subcutaneous Daily   levothyroxine  137 mcg Oral QAC breakfast   pantoprazole  40 mg Oral BID   sodium chloride flush  3 mL Intravenous Q12H   venlafaxine XR  150 mg Oral Q24H   Warfarin - Pharmacist Dosing Inpatient   Does not apply q1600  Continuous Infusions:  ceFEPime (MAXIPIME) IV 2 g (09/20/22 0540)   methylPREDNISolone (SOLU-MEDROL) injection 500 mg (09/20/22 0555)   vancomycin 750 mg (09/19/22 2138)    Diet Order             Diet Heart Room service appropriate? Yes; Fluid consistency: Thin  Diet effective now                    Intake/Output Summary (Last 24 hours) at 09/20/2022 0901 Last data filed at 09/20/2022 0854 Gross per 24 hour  Intake 488 ml  Output --  Net 488 ml   Net IO Since Admission: 1,079.61 mL [09/20/22 0901]  Wt Readings from Last 3 Encounters:  09/20/22 104.8 kg  07/14/22 103.1 kg  06/17/22 104.1 kg     Unresulted Labs (From admission, onward)     Start     Ordered   09/20/22 0500  CBC  Daily,   R      09/19/22 1117    09/18/22 1109  ANCA Titers  (Anti-Neutrophilic Cystoplasmic Antibody Panel (PNL))  Once,   R        09/18/22 1108   09/18/22 1109  ANA, IFA (with reflex)  Once,   R        09/18/22 1108   09/18/22 1109  Rheumatoid factor  Once,   R        09/18/22 1108   09/18/22 1109  CYCLIC CITRUL PEPTIDE ANTIBODY, IGG/IGA  Once,   R        09/18/22 1108   09/18/22 0500  Protime-INR  Daily,   R      09/18/22 0042   09/18/22 0053  Hemoglobin A1c  Once,   R       Comments: To assess prior glycemic control    09/18/22 0052   09/17/22 2050  Culture, Respiratory w Gram Stain  Once,   R        09/17/22 2053  Data Reviewed: I have personally reviewed following labs and imaging studies CBC: Recent Labs  Lab 09/17/22 1448 09/17/22 1512 09/18/22 0706 09/19/22 0741  WBC 10.6*  --  10.1 18.3*  NEUTROABS 8.7*  --   --   --   HGB 11.8* 12.9* 12.1* 12.5*  HCT 37.2* 38.0* 39.3 39.9  MCV 79.1*  --  80.0 81.4  PLT 436*  --  458* 509*  Basic Metabolic Panel: Recent Labs  Lab 09/17/22 1448 09/17/22 1512 09/18/22 0706 09/19/22 0741  NA 139 141 140 139  K 4.3 4.2 4.0 3.6  CL 105  --  106 107  CO2 24  --  23 22  GLUCOSE 107*  --  139* 150*  BUN 16  --  16 21  CREATININE 0.90  --  0.89 0.96  CALCIUM 8.7*  --  8.9 8.9  MG  --   --  2.1 2.1  PHOS  --   --  2.8  --   GFR: Estimated Creatinine Clearance: 75.6 mL/min (by C-G formula based on SCr of 0.96 mg/dL). Liver Function Tests: Recent Labs  Lab 09/17/22 1448 09/19/22 0741  AST 28 26  ALT 18 21  ALKPHOS 116 99  BILITOT 0.6 0.7  PROT 6.9 6.9  ALBUMIN 3.9 3.4*   No results for input(s): "LIPASE", "AMYLASE" in the last 168 hours. No results for input(s): "AMMONIA" in the last 168 hours. Coagulation Profile: Recent Labs  Lab 09/17/22 1448 09/18/22 0706 09/19/22 0741  INR 1.8* 1.7* 2.1*   Recent Labs  Lab 09/19/22 0736 09/19/22 1219 09/19/22 1517 09/19/22 2002 09/20/22 0844  GLUCAP 164* 188* 174* 212* 157*   Recent  Labs  Lab 09/18/22 1150  PROCALCITON <0.10    Recent Results (from the past 240 hour(s))  Resp panel by RT-PCR (RSV, Flu A&B, Covid) Anterior Nasal Swab     Status: None   Collection Time: 09/17/22  2:48 PM   Specimen: Anterior Nasal Swab  Result Value Ref Range Status   SARS Coronavirus 2 by RT PCR NEGATIVE NEGATIVE Final    Comment: (NOTE) SARS-CoV-2 target nucleic acids are NOT DETECTED.  The SARS-CoV-2 RNA is generally detectable in upper respiratory specimens during the acute phase of infection. The lowest concentration of SARS-CoV-2 viral copies this assay can detect is 138 copies/mL. A negative result does not preclude SARS-Cov-2 infection and should not be used as the sole basis for treatment or other patient management decisions. A negative result may occur with  improper specimen collection/handling, submission of specimen other than nasopharyngeal swab, presence of viral mutation(s) within the areas targeted by this assay, and inadequate number of viral copies(<138 copies/mL). A negative result must be combined with clinical observations, patient history, and epidemiological information. The expected result is Negative.  Fact Sheet for Patients:  BloggerCourse.com  Fact Sheet for Healthcare Providers:  SeriousBroker.it  This test is no t yet approved or cleared by the [place] FDA and  has been authorized for detection and/or diagnosis of SARS-CoV-2 by FDA under an Emergency Use Authorization (EUA). This EUA will remain  in effect (meaning this test can be used) for the duration of the COVID-19 declaration under Section 564(b)(1) of the Act, 21 U.S.C.section 360bbb-3(b)(1), unless the authorization is terminated  or revoked sooner.       Influenza A by PCR NEGATIVE NEGATIVE Final   Influenza B by PCR NEGATIVE NEGATIVE Final    Comment: (NOTE) The Xpert Xpress SARS-CoV-2/FLU/RSV plus assay is intended as an  aid in the diagnosis of influenza from Nasopharyngeal swab specimens and should not be used as a sole basis for treatment. Nasal washings and aspirates are unacceptable for Xpert Xpress SARS-CoV-2/FLU/RSV testing.  Fact Sheet for Patients: BloggerCourse.com  Fact Sheet for Healthcare Providers: SeriousBroker.it  This test is not yet approved or cleared by the [place] FDA and has been authorized for detection and/or diagnosis of SARS-CoV-2 by FDA under an Emergency Use Authorization (EUA). This EUA will remain in effect (meaning this test can be used) for the duration of the COVID-19 declaration under Section 564(b)(1) of the Act, 21 U.S.C. section 360bbb-3(b)(1), unless the authorization is terminated or revoked.     Resp Syncytial Virus by PCR NEGATIVE NEGATIVE Final    Comment: (NOTE) Fact Sheet for Patients: BloggerCourse.com  Fact Sheet for Healthcare Providers: SeriousBroker.it  This test is not yet approved or cleared by the [place] FDA and has been authorized for detection and/or diagnosis of SARS-CoV-2 by FDA under an Emergency Use Authorization (EUA). This EUA will remain in effect (meaning this test can be used) for the duration of the COVID-19 declaration under Section 564(b)(1) of the Act, 21 U.S.C. section 360bbb-3(b)(1), unless the authorization is terminated or revoked.  Performed at [facility], [address], [place], [place] [address]   MRSA culture     Status: None (Preliminary result)   Collection Time: 09/17/22  9:31 PM   Specimen: Nasal Mucosa; Body Fluid  Result Value Ref Range Status   Specimen Description NASOPHARYNGEAL  Final   Special Requests NONE  Final   Culture   Final    CULTURE REINCUBATED FOR BETTER GROWTH Performed at [facility], [address], [place], [place] [address]    Report Status  PENDING  Incomplete  Respiratory (~20 pathogens) panel by PCR     Status: None   Collection Time: 09/17/22 11:18 PM   Specimen: Nasopharyngeal Swab; Respiratory  Result Value Ref Range Status   Adenovirus NOT DETECTED NOT DETECTED Final   Coronavirus 229E NOT DETECTED NOT DETECTED Final    Comment: (NOTE) The Coronavirus on the Respiratory Panel, DOES NOT test for the novel  Coronavirus (2019 nCoV)    Coronavirus HKU1 NOT DETECTED NOT DETECTED Final   Coronavirus NL63 NOT DETECTED NOT DETECTED Final   Coronavirus OC43 NOT DETECTED NOT DETECTED Final   Metapneumovirus NOT DETECTED NOT DETECTED Final   Rhinovirus / Enterovirus NOT DETECTED NOT DETECTED Final   Influenza A NOT DETECTED NOT DETECTED Final   Influenza B NOT DETECTED NOT DETECTED Final   Parainfluenza Virus 1 NOT DETECTED NOT DETECTED Final   Parainfluenza Virus 2 NOT DETECTED NOT DETECTED Final   Parainfluenza Virus 3 NOT DETECTED NOT DETECTED Final   Parainfluenza Virus 4 NOT DETECTED NOT DETECTED Final   Respiratory Syncytial Virus NOT DETECTED NOT DETECTED Final   Bordetella pertussis NOT DETECTED NOT DETECTED Final   Bordetella Parapertussis NOT DETECTED NOT DETECTED Final   Chlamydophila pneumoniae NOT DETECTED NOT DETECTED Final   Mycoplasma pneumoniae NOT DETECTED NOT DETECTED Final    Comment: Performed at [facility], [address], [place], [place] [address]    Antimicrobials: Anti-infectives (From admission, onward)    Start     Dose/Rate Route Frequency Ordered Stop   09/18/22 1100  vancomycin (VANCOREADY) IVPB 750 mg/150 mL        750 mg 150 mL/hr over 60 Minutes Intravenous Every 12 hours 09/17/22 2224     09/17/22  2245  vancomycin (VANCOREADY) IVPB 1500 mg/300 mL        1,500 mg 150 mL/hr over 120 Minutes Intravenous  Once 09/17/22 2148 09/18/22 0846   09/17/22 2230  ceFEPIme (MAXIPIME) 2 g in sodium chloride 0.9 % 100 mL IVPB        2 g 200 mL/hr over 30 Minutes Intravenous Every 8 hours  09/17/22 2130        Culture/Microbiology    Component Value Date/Time   SDES NASOPHARYNGEAL 09/17/2022 2131   SPECREQUEST NONE 09/17/2022 2131   CULT  09/17/2022 2131    CULTURE REINCUBATED FOR BETTER GROWTH Performed at [facility], [address], [place], [place] [address]    REPTSTATUS PENDING 09/17/2022 2131  Other culture-see note  Radiology Studies: No results found.   LOS: 3 days   Lanae Boast, MD [facility]  09/20/2022, 9:01 AM

## 2022-09-20 NOTE — Progress Notes (Signed)
NAME:  Gary [name], MRN:  [id number], DOB:  01-27-1945, LOS: 3 ADMISSION DATE:  09/17/2022, CONSULTATION DATE:  9/13 REFERRING MD:  Lazarus Salines, CHIEF COMPLAINT:  ILD flare    History of Present Illness:  77 year old male who presented to the emergency room on 9/13 with chief complaint of 3-day history of worsening shortness of breath.  Has oxygen at home, but typically does not use or needed.  Noting pulse oximetry to drop into the 80s with any exertion, progressed to the point where he was short of breath just talking.  Did endorse mildly productive cough worse than his baseline chronic cough.  No fever chills or chest discomfort no nausea or vomiting no sick exposure.  COVID test outpatient was negative ER evaluation: CT chest was negative for pulmonary emboli.  There was progressive bilateral groundglass opacities throughout both lungs much worse than when comparing CT imaging 6 months ago BNP was 322, troponin I negative. He was placed on supplemental oxygen, pulse IV steroids, and broad-spectrum antibiotics. Pulmonary asked to evaluate for further recommendations on management  He has history of Wegener's diagnosed 5 years ago.  He has been on Rituxan for the past 5 years and chronic prednisone at 5 mg [initially on 60 mg of prednisone].  The Rituxan dose was increased 1 year ago due to poor control of symptoms  He used to run a Administrator, sports.  Quit smoking at the age of 38.  No significant exposures, travel history.  No family history of lung disease.  He was started on portable oxygen through the CPAP and with exertion 6 months ago during hospitalization for pneumonia.  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  Objective   Blood pressure (!) 142/84, pulse 60, temperature (!) 97.1 F (36.2 C), temperature source Oral, resp. rate 15, weight 104.8 kg, SpO2 96%.        Intake/Output Summary (Last 24 hours) at 09/20/2022 0856 Last data filed at 09/20/2022 0854 Gross per 24 hour  Intake 488 ml  Output --  Net 488 ml   Filed Weights   09/18/22 0459 09/19/22 0500 09/20/22 0500  Weight: 104.3 kg 104.3 kg 104.8 kg    Examination: Gen:      No acute distress HEENT:  moist mucous membranes, sclera anicteric Neck:     No masses; no thyromegaly Lungs:    no rales, diminished air movement CV:         Regular rate and rhythm; no murmurs Abd:      + bowel sounds; soft, non-tender; no palpable masses, no distension Ext:    No edema; adequate peripheral perfusion Skin:      Warm and dry; no rash Neuro: alert and oriented x 3 Psych: normal mood and affect   Lab/imaging reviewed Significant for WBC 10.1, hemoglobin 12.1, platelets 458 CTA 09/17/2022-progressive patchy groundglass opacities bilaterally with underlying fibrosis.  Resolved Hospital Problem list     Assessment & Plan:   Acute hypoxic respiratory failure Wegener's granulomatosis ILD HFrEF  CAF on warfarin Coronary artery disease Hyperlipidemia Type 2 diabetes Hypertension OSA PAD  Pulmonary problem list Acute hypoxic respiratory failure secondary to progressive groundglass bilateral opacities.  Differential diagnosis includes flare of known underlying ILD.  Infection is less likely as his viral panel, PCT is negative.  In retrospect his episode of pneumonia in February 2024 may have been an ILD flare.  Now he has extensive NSIP opacities and likely some underlying fibrosis  Plan/rec Continue supplemental oxygen for  goal SpO2 92% or greater Likely will only need oxygen with exertion, can be on room air at rest CTD serologies are pending Started pulse dose steroids of 1 g/day for 3 days on 9/14. Will give last dose of 500mg  IV solumedrol tomorrow morning, then transition to 60mg  prednisone daily with bactrim ppx Will likely need CellCept initiation for NSIP fibrosis and possibly antifibrotic's as an outpatient.  Will arrange follow-up in ILD clinic Continue Rituxan per rheumatology.  His next dose of Rituxan is scheduled on Thursday as an outpatient and he hopes to be discharged by then to be able to get the therapy.  Best Practice (right click and "Reselect all SmartList Selections" daily)   Per primary  Signature:    Melody Comas, MD [facility] Office: [phone]   See Amion for personal pager PCCM on call pager [phone] until 7pm. Please call Elink 7p-7a. [phone]

## 2022-09-20 NOTE — Plan of Care (Signed)
  Problem: Education: Goal: Understanding of cardiac disease, CV risk reduction, and recovery process will improve Outcome: Progressing Goal: Individualized Educational Video(s) Outcome: Progressing   Problem: Activity: Goal: Ability to tolerate increased activity will improve Outcome: Progressing   Problem: Cardiac: Goal: Ability to achieve and maintain adequate cardiovascular perfusion will improve Outcome: Progressing   Problem: Health Behavior/Discharge Planning: Goal: Ability to safely manage health-related needs after discharge will improve Outcome: Progressing   Problem: Education: Goal: Understanding of CV disease, CV risk reduction, and recovery process will improve Outcome: Progressing Goal: Individualized Educational Video(s) Outcome: Progressing   Problem: Activity: Goal: Ability to return to baseline activity level will improve Outcome: Progressing   Problem: Cardiovascular: Goal: Ability to achieve and maintain adequate cardiovascular perfusion will improve Outcome: Progressing Goal: Vascular access site(s) Level 0-1 will be maintained Outcome: Progressing   Problem: Health Behavior/Discharge Planning: Goal: Ability to safely manage health-related needs after discharge will improve Outcome: Progressing   Problem: Education: Goal: Knowledge of cardiac device and self-care will improve Outcome: Progressing Goal: Ability to safely manage health related needs after discharge will improve Outcome: Progressing Goal: Individualized Educational Video(s) Outcome: Progressing   Problem: Cardiac: Goal: Ability to achieve and maintain adequate cardiopulmonary perfusion will improve Outcome: Progressing   Problem: Education: Goal: Knowledge of General Education information will improve Description: Including pain rating scale, medication(s)/side effects and non-pharmacologic comfort measures Outcome: Progressing   Problem: Health Behavior/Discharge  Planning: Goal: Ability to manage health-related needs will improve Outcome: Progressing   Problem: Clinical Measurements: Goal: Ability to maintain clinical measurements within normal limits will improve Outcome: Progressing Goal: Will remain free from infection Outcome: Progressing Goal: Diagnostic test results will improve Outcome: Progressing Goal: Respiratory complications will improve Outcome: Progressing Goal: Cardiovascular complication will be avoided Outcome: Progressing   Problem: Activity: Goal: Risk for activity intolerance will decrease Outcome: Progressing   Problem: Nutrition: Goal: Adequate nutrition will be maintained Outcome: Progressing   Problem: Coping: Goal: Level of anxiety will decrease Outcome: Progressing   Problem: Elimination: Goal: Will not experience complications related to bowel motility Outcome: Progressing Goal: Will not experience complications related to urinary retention Outcome: Progressing   Problem: Pain Managment: Goal: General experience of comfort will improve Outcome: Progressing   Problem: Safety: Goal: Ability to remain free from injury will improve Outcome: Progressing   Problem: Skin Integrity: Goal: Risk for impaired skin integrity will decrease Outcome: Progressing   Problem: Education: Goal: Ability to describe self-care measures that may prevent or decrease complications (Diabetes Survival Skills Education) will improve Outcome: Progressing Goal: Individualized Educational Video(s) Outcome: Progressing   Problem: Coping: Goal: Ability to adjust to condition or change in health will improve Outcome: Progressing   Problem: Fluid Volume: Goal: Ability to maintain a balanced intake and output will improve Outcome: Progressing   Problem: Health Behavior/Discharge Planning: Goal: Ability to identify and utilize available resources and services will improve Outcome: Progressing Goal: Ability to manage  health-related needs will improve Outcome: Progressing   Problem: Metabolic: Goal: Ability to maintain appropriate glucose levels will improve Outcome: Progressing   Problem: Nutritional: Goal: Maintenance of adequate nutrition will improve Outcome: Progressing Goal: Progress toward achieving an optimal weight will improve Outcome: Progressing   Problem: Skin Integrity: Goal: Risk for impaired skin integrity will decrease Outcome: Progressing   Problem: Tissue Perfusion: Goal: Adequacy of tissue perfusion will improve Outcome: Progressing

## 2022-09-20 NOTE — Progress Notes (Addendum)
ANTICOAGULATION CONSULT NOTE   Pharmacy Consult for Warfarin Indication: atrial fibrillation  Allergies  Allergen Reactions   Farxiga [Dapagliflozin] Other (See Comments)    Lightheadedness Low blood pressure   Statins Other (See Comments)    Myalgias     Patient Measurements: Weight: 104.8 kg (231 lb 0.7 oz)  Vital Signs: Temp: 97.1 F (36.2 C) (09/16 0845) Temp Source: Oral (09/16 0845) BP: 142/84 (09/16 0845) Pulse Rate: 60 (09/16 0845)  Labs: Recent Labs    09/17/22 1448 09/17/22 1512 09/17/22 1651 09/18/22 0706 09/19/22 0741 09/20/22 0820  HGB 11.8*   < >  --  12.1* 12.5* 12.0*  HCT 37.2*   < >  --  39.3 39.9 39.2  PLT 436*  --   --  458* 509* 507*  LABPROT 21.0*  --   --  19.9* 23.4* 30.8*  INR 1.8*  --   --  1.7* 2.1* 2.9*  CREATININE 0.90  --   --  0.89 0.96  --   TROPONINIHS 15  --  14  --   --   --    < > = values in this interval not displayed.    Estimated Creatinine Clearance: 75.6 mL/min (by C-G formula based on SCr of 0.96 mg/dL).   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 M presents with SOB/PNA. Pt on warfarin PTA for afib. Admission INR 1.8. CBC ok on admission. Home dose: 2.5mg  M/W/F and 5mg  all other days (last dose taken 9/13).   INR increase to 2.9 today. We will give a small dose.   PTA 5mg  PO qday except 2.5mg  MWF Goal of Therapy:  INR 2-3 Monitor platelets by  anticoagulation protocol: Yes   Plan:  Warfarin 1 mg x 1  Monitor INR, CBC, and for s/sx of bleeding  Ulyses Southward, PharmD, BCIDP, AAHIVP, CPP Infectious Disease Pharmacist 09/20/2022 10:31 AM

## 2022-09-20 NOTE — Progress Notes (Signed)
Pt will place self on/off home cpap when ready.

## 2022-09-20 NOTE — Hospital Course (Signed)
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   In ED, slightly tachypneic.  Mild leukocytosis to 10.6 with left shift.  BNP 322.  Serial troponin negative.  INR 1.8.  COVID-19, influenza and RSV PCR nonreactive.  CT angio chest negative for PE but concerning for progressive chronic interstitial lung disease.  Full RVP nonreactive.  Started on IV, vancomycin and cefepime.  Pulmonology consulted and started high-dose Solu-Medrol for acute hypoxic aspiratory failure secondary to progressive groundglass bilateral opacities concerning for underlying ILD flare up workup unremarkable for viral panel procalcitonin negative less likely infectious etiology.

## 2022-09-21 ENCOUNTER — Telehealth: Payer: Self-pay | Admitting: Pulmonary Disease

## 2022-09-21 DIAGNOSIS — J9601 Acute respiratory failure with hypoxia: Secondary | ICD-10-CM | POA: Diagnosis not present

## 2022-09-21 LAB — CBC
HCT: 37.5 % — ABNORMAL LOW (ref 39.0–52.0)
Hemoglobin: 11.6 g/dL — ABNORMAL LOW (ref 13.0–17.0)
MCH: 24.5 pg — ABNORMAL LOW (ref 26.0–34.0)
MCHC: 30.9 g/dL (ref 30.0–36.0)
MCV: 79.3 fL — ABNORMAL LOW (ref 80.0–100.0)
Platelets: 482 10*3/uL — ABNORMAL HIGH (ref 150–400)
RBC: 4.73 MIL/uL (ref 4.22–5.81)
RDW: 17.3 % — ABNORMAL HIGH (ref 11.5–15.5)
WBC: 17 10*3/uL — ABNORMAL HIGH (ref 4.0–10.5)
nRBC: 0 % (ref 0.0–0.2)

## 2022-09-21 LAB — ANCA TITERS
Atypical P-ANCA titer: <1:20 {titer}
C-ANCA: <1:20 {titer}
P-ANCA: <1:20 {titer}

## 2022-09-21 LAB — PROTIME-INR
INR: 2.8 — ABNORMAL HIGH (ref 0.8–1.2)
Prothrombin Time: 30 s — ABNORMAL HIGH (ref 11.4–15.2)

## 2022-09-21 LAB — BASIC METABOLIC PANEL
Anion gap: 12 (ref 5–15)
BUN: 30 mg/dL — ABNORMAL HIGH (ref 8–23)
CO2: 22 mmol/L (ref 22–32)
Calcium: 8.8 mg/dL — ABNORMAL LOW (ref 8.9–10.3)
Chloride: 107 mmol/L (ref 98–111)
Creatinine, Ser: 0.99 mg/dL (ref 0.61–1.24)
GFR, Estimated: >60 mL/min (ref >60)
Glucose, Bld: 161 mg/dL — ABNORMAL HIGH (ref 70–99)
Potassium: 3.2 mmol/L — ABNORMAL LOW (ref 3.5–5.1)
Sodium: 141 mmol/L (ref 135–145)

## 2022-09-21 LAB — GLUCOSE, CAPILLARY
Glucose-Capillary: 171 mg/dL — ABNORMAL HIGH (ref 70–99)
Glucose-Capillary: 174 mg/dL — ABNORMAL HIGH (ref 70–99)
Glucose-Capillary: 140 mg/dL — ABNORMAL HIGH (ref 70–99)
Glucose-Capillary: 148 mg/dL — ABNORMAL HIGH (ref 70–99)

## 2022-09-21 LAB — ANTINUCLEAR ANTIBODIES, IFA: ANA Ab, IFA: NEGATIVE

## 2022-09-21 LAB — RHEUMATOID FACTOR: Rheumatoid fact SerPl-aCnc: 10.2 [IU]/mL (ref <14.0)

## 2022-09-21 MED ORDER — PREDNISONE 20 MG PO TABS: 30.0000 mg | ORAL_TABLET | Freq: Every day | ORAL | Status: DC

## 2022-09-21 MED ORDER — PREDNISONE 20 MG PO TABS
60.0000 mg | ORAL_TABLET | Freq: Every day | ORAL | Status: DC
  Administered 2022-09-22: 60 mg via ORAL
  Filled 2022-09-21: qty 3

## 2022-09-21 MED ORDER — SULFAMETHOXAZOLE-TRIMETHOPRIM 800-160 MG PO TABS: 1.0000 | ORAL_TABLET | ORAL | Status: DC

## 2022-09-21 MED ORDER — PREDNISONE 20 MG PO TABS: 20.0000 mg | ORAL_TABLET | Freq: Every day | ORAL | Status: DC

## 2022-09-21 MED ORDER — ATOVAQUONE 750 MG/5ML PO SUSP: 1500.0000 mg | Freq: Every day | ORAL | Status: DC

## 2022-09-21 MED ORDER — POTASSIUM CHLORIDE CRYS ER 20 MEQ PO TBCR
20.0000 meq | EXTENDED_RELEASE_TABLET | Freq: Every day | ORAL | Status: AC
  Administered 2022-09-21: 20 meq via ORAL
  Filled 2022-09-21: qty 1

## 2022-09-21 MED ORDER — ATOVAQUONE 750 MG/5ML PO SUSP
1500.0000 mg | Freq: Every day | ORAL | Status: DC
  Administered 2022-09-22: 1500 mg via ORAL
  Filled 2022-09-21: qty 10

## 2022-09-21 MED ORDER — PREDNISONE 20 MG PO TABS: 40.0000 mg | ORAL_TABLET | Freq: Every day | ORAL | Status: DC

## 2022-09-21 MED ORDER — POTASSIUM CHLORIDE CRYS ER 20 MEQ PO TBCR
40.0000 meq | EXTENDED_RELEASE_TABLET | Freq: Once | ORAL | Status: AC
  Administered 2022-09-21: 40 meq via ORAL
  Filled 2022-09-21: qty 2

## 2022-09-21 MED ORDER — WARFARIN SODIUM 2.5 MG PO TABS
2.5000 mg | ORAL_TABLET | Freq: Once | ORAL | Status: AC
  Administered 2022-09-21: 2.5 mg via ORAL
  Filled 2022-09-21: qty 1

## 2022-09-21 NOTE — Progress Notes (Signed)
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   In ED, slightly tachypneic.  Mild leukocytosis to 10.6 with left shift.  BNP 322.  Serial troponin negative.  INR 1.8.  COVID-19, influenza and RSV PCR nonreactive.  CT angio chest negative for PE but concerning for progressive chronic interstitial lung disease.  Full RVP nonreactive.  Started on IV, vancomycin and cefepime.  Pulmonology consulted and started high-dose Solu-Medrol for acute hypoxic aspiratory failure secondary to progressive groundglass bilateral opacities concerning for underlying ILD flare up workup unremarkable for viral panel procalcitonin negative less likely infectious etiology.    Subjective: Seen and examined He feels well, no new complaints On 2l [place] Overnight afebrile BP stable used his CPAP during the night Labs shows blood sugar 170s hypokalemia 3.2 leukocytosis but slightly better States he started back on ozempic last Saturday  Assessment and Plan: Principal Problem:   Acute hypoxemic respiratory failure (HCC) Active Problems:   ILD (interstitial lung disease) (HCC)  Acute hypoxic respiratory failure due to progressive groundglass bilateral opacities  Differentials: Acute flareup of underlying ILD, less likely infectious etiology given procalcitonin negative viral panel is negative. he is followed by rheumatology.  He is on rituximab and prednisone>Just had his rituximab few days prior and is due on Thursday.  Appreciate pulmonary input on board continue pulse dose steroid  500 mg till 0/17,  then Po  from 9/18 60 mg prednisone w/ bactrim prophylaxis, will likely need CellCept  for NSIP fibrosis and possibly antifibrotic per PCCM who will will arrange follow-up in ILD clinic. Continue empiric antibiotics, CTD serologies pending.  Continue Rituxan per rheumatology.  Overall clinically improving, on 2 to nasal cannula.   History of CAD/CABG/PCI:  Stable.  Continue home Coreg, Plavix, Coumadin.    HTN: BP well-controlled on home Amlodipine, Coreg and lasix.  A-fib: Rate controlled on coumadin and therapeutic, pharmacy monitoring INR and dosing Coumadin.   Recent Labs  Lab 09/17/22 1448 09/18/22 0706 09/19/22 0741 09/20/22 0820 09/21/22 0742  INR 1.8* 1.7* 2.1* 2.9* 2.8*    NIDDM-2 with hyperglycemia:  Blood sugars well-controlled.  A1c 6.8% in 02/2022.Cont semeglee 10 u and ssi, he states he is back on ozempic last week. Recent Labs  Lab 09/18/22 0706 09/18/22 0805 09/20/22 0844 09/20/22 1214 09/20/22 1611 09/20/22 2106 09/21/22 0745  GLUCAP  --    < > 157* 209* 171* 164* 171*  HGBA1C 6.5*  --   --   --   --   --   --    < > = values in this interval not displayed.    OSA on CPAP: Continue CPAP at bedtime.    Aortic aneurysm: Avoid fluoroquinolones.  Hypothyroidism: Continue home levothyroxine  Mood Disorder: Mood stable on home venlafaxine, hold p.m. benzodiazepines while inpatient.   Class 1 Obesity:Patient's Body mass index is 32.9 kg/m. : Will benefit with PCP follow-up, weight loss  healthy lifestyle and outpatient sleep evaluation.  DVT prophylaxis: On Coumadin Code Status:   Code Status: Full Code Family Communication: plan of care discussed with patient at bedside. Patient status is: Inpatient because of  respiratory failure Level of care: Telemetry Medical   Dispo: The patient is from: Home            Anticipated disposition: Anticipate discharge 9/18   Objective: Vitals last 24 hrs: Vitals:   09/20/22 1959 09/20/22 2354  09/21/22 0518 09/21/22 0744  BP: 132/71 (!) 141/72 139/72 123/66  Pulse: 63 61 60 63  Resp: 17 17 16    Temp:  97.9 F (36.6 C) 98.3 F (36.8 C) 97.6 F (36.4 C)  TempSrc:  Oral Oral Oral  SpO2: 93% 93% 95% 92%  Weight:   104 kg    Weight change: -0.8 kg  Physical Examination: General exam: alert awake, orientedx3 HEENT:Oral mucosa moist, Ear/Nose WNL grossly Respiratory system: Bilaterally crackles on  lungs,no use of accessory muscle Cardiovascular system: S1 & S2 +, No JVD. Gastrointestinal system: Abdomen soft,NT,ND, BS+ Nervous System: Alert, awake, moving all extremities,and following commands. Extremities: LE edema neg,distal peripheral pulses palpable and warm.  Skin: No rashes,no icterus. MSK: Normal muscle bulk,tone, power   Medications reviewed:  Scheduled Meds:  amLODipine  2.5 mg Oral Q24H   carvedilol  12.5 mg Oral BID   clopidogrel  75 mg Oral Q breakfast   dofetilide  500 mcg Oral BID   famotidine  20 mg Oral BID   ferrous sulfate  325 mg Oral Q24H   furosemide  40 mg Oral Daily   insulin aspart  0-15 Units Subcutaneous TID WC   insulin aspart  0-5 Units Subcutaneous QHS   insulin glargine-yfgn  10 Units Subcutaneous Daily   levothyroxine  137 mcg Oral QAC breakfast   pantoprazole  40 mg Oral BID   potassium chloride  20 mEq Oral Daily   sodium chloride flush  3 mL Intravenous Q12H   venlafaxine XR  150 mg Oral Q24H   warfarin  2.5 mg Oral ONCE-1600   Warfarin - Pharmacist Dosing Inpatient   Does not apply q1600  Continuous Infusions:  ceFEPime (MAXIPIME) IV 2 g (09/21/22 0546)   methylPREDNISolone (SOLU-MEDROL) injection 500 mg (09/21/22 0428)   vancomycin 750 mg (09/21/22 1007)    Diet Order             Diet Heart Room service appropriate? Yes; Fluid consistency: Thin  Diet effective now                    Intake/Output Summary (Last 24 hours) at 09/21/2022 1029 Last data filed at 09/21/2022 0827 Gross per 24 hour  Intake 1194.07 ml   Output 500 ml  Net 694.07 ml   Net IO Since Admission: 1,773.68 mL [09/21/22 1029]  Wt Readings from Last 3 Encounters:  09/21/22 104 kg  07/14/22 103.1 kg  06/17/22 104.1 kg     Unresulted Labs (From admission, onward)     Start     Ordered   09/21/22 0500  Basic metabolic panel  Daily,   R      09/20/22 1258   09/20/22 0500  CBC  Daily,   R      09/19/22 1117   09/18/22 1109  ANCA Titers  (Anti-Neutrophilic Cystoplasmic Antibody Panel (PNL))  Once,   R        09/18/22 1108   09/18/22 1109  ANA, IFA (with reflex)  Once,   R        09/18/22 1108   09/18/22 0500  Protime-INR  Daily,   R      09/18/22 0042   09/17/22 2050  Culture,  Respiratory w Gram Stain  Once,   R        09/17/22 2053          Data Reviewed: I have personally reviewed following labs and imaging studies CBC: Recent Labs  Lab 09/17/22 1448 09/17/22 1512 09/18/22 0706 09/19/22 0741 09/20/22 0820 09/21/22 0742  WBC 10.6*  --  10.1 18.3* 18.2* 17.0*  NEUTROABS 8.7*  --   --   --   --   --   HGB 11.8* 12.9* 12.1* 12.5* 12.0* 11.6*  HCT 37.2* 38.0* 39.3 39.9 39.2 37.5*  MCV 79.1*  --  80.0 81.4 80.3 79.3*  PLT 436*  --  458* 509* 507* 482*  Basic Metabolic Panel: Recent Labs  Lab 09/17/22 1448 09/17/22 1512 09/18/22 0706 09/19/22 0741 09/21/22 0742  NA 139 141 140 139 141  K 4.3 4.2 4.0 3.6 3.2*  CL 105  --  106 107 107  CO2 24  --  23 22 22   GLUCOSE 107*  --  139* 150* 161*  BUN 16  --  16 21 30*  CREATININE 0.90  --  0.89 0.96 0.99  CALCIUM 8.7*  --  8.9 8.9 8.8*  MG  --   --  2.1 2.1  --   PHOS  --   --  2.8  --   --   GFR: Estimated Creatinine Clearance: 73.1 mL/min (by C-G formula based on SCr of 0.99 mg/dL). Liver Function Tests: Recent Labs  Lab 09/17/22 1448 09/19/22 0741  AST 28 26  ALT 18 21  ALKPHOS 116 99  BILITOT 0.6 0.7  PROT 6.9 6.9  ALBUMIN 3.9 3.4*   No results for input(s): "LIPASE", "AMYLASE" in the last 168 hours. No results for input(s): "AMMONIA" in the  last 168 hours. Coagulation Profile: Recent Labs  Lab 09/17/22 1448 09/18/22 0706 09/19/22 0741 09/20/22 0820 09/21/22 0742  INR 1.8* 1.7* 2.1* 2.9* 2.8*   Recent Labs  Lab 09/20/22 0844 09/20/22 1214 09/20/22 1611 09/20/22 2106 09/21/22 0745  GLUCAP 157* 209* 171* 164* 171*   Recent Labs  Lab 09/18/22 1150  PROCALCITON <0.10    Recent Results (from the past 240 hour(s))  Resp panel by RT-PCR (RSV, Flu A&B, Covid) Anterior Nasal Swab     Status: None   Collection Time: 09/17/22  2:48 PM   Specimen: Anterior Nasal Swab  Result Value Ref Range Status   SARS Coronavirus 2 by RT PCR NEGATIVE NEGATIVE Final    Comment: (NOTE) SARS-CoV-2 target nucleic acids are NOT DETECTED.  The SARS-CoV-2 RNA is generally detectable in upper respiratory specimens during the acute phase of infection. The lowest concentration of SARS-CoV-2 viral copies this assay can detect is 138 copies/mL. A negative result does not preclude SARS-Cov-2 infection and should not be used as the sole basis for treatment or other patient management decisions. A negative result may occur with  improper specimen collection/handling, submission of specimen other than nasopharyngeal swab, presence of viral mutation(s) within the areas targeted by this assay, and inadequate number of viral copies(<138 copies/mL). A negative result must be combined with clinical observations, patient history, and epidemiological information. The expected result is Negative.  Fact Sheet for Patients:  BloggerCourse.com  Fact Sheet for Healthcare Providers:  SeriousBroker.it  This test is no t yet approved or cleared by the [place] FDA and  has been authorized for detection and/or diagnosis of SARS-CoV-2 by FDA under an Emergency Use Authorization (EUA). This EUA will remain  in  effect (meaning this test can be used) for the duration of the COVID-19 declaration under  Section 564(b)(1) of the Act, 21 U.S.C.section 360bbb-3(b)(1), unless the authorization is terminated  or revoked sooner.       Influenza A by PCR NEGATIVE NEGATIVE Final   Influenza B by PCR NEGATIVE NEGATIVE Final    Comment: (NOTE) The Xpert Xpress SARS-CoV-2/FLU/RSV plus assay is intended as an aid in the diagnosis of influenza from Nasopharyngeal swab specimens and should not be used as a sole basis for treatment. Nasal washings and aspirates are unacceptable for Xpert Xpress SARS-CoV-2/FLU/RSV testing.  Fact Sheet for Patients: BloggerCourse.com  Fact Sheet for Healthcare Providers: SeriousBroker.it  This test is not yet approved or cleared by the [place] FDA and has been authorized for detection and/or diagnosis of SARS-CoV-2 by FDA under an Emergency Use Authorization (EUA). This EUA will remain in effect (meaning this test can be used) for the duration of the COVID-19 declaration under Section 564(b)(1) of the Act, 21 U.S.C. section 360bbb-3(b)(1), unless the authorization is terminated or revoked.     Resp Syncytial Virus by PCR NEGATIVE NEGATIVE Final    Comment: (NOTE) Fact Sheet for Patients: BloggerCourse.com  Fact Sheet for Healthcare Providers: SeriousBroker.it  This test is not yet approved or cleared by the [place] FDA and has been authorized for detection and/or diagnosis of SARS-CoV-2 by FDA under an Emergency Use Authorization (EUA). This EUA will remain in effect (meaning this test can be used) for the duration of the COVID-19 declaration under Section 564(b)(1) of the Act, 21 U.S.C. section 360bbb-3(b)(1), unless the authorization is terminated or revoked.  Performed at [facility], [address] [place], [place] [address]   MRSA culture     Status: None   Collection Time: 09/17/22  9:31 PM   Specimen: Nasal  Mucosa; Body Fluid  Result Value Ref Range Status   Specimen Description NASOPHARYNGEAL  Final   Special Requests NONE  Final   Culture   Final    NO MRSA DETECTED Performed at [facility], [address] [place], [place] [address]    Report Status 09/20/2022 FINAL  Final  Respiratory (~20 pathogens) panel by PCR     Status: None   Collection Time: 09/17/22 11:18 PM   Specimen: Nasopharyngeal Swab; Respiratory  Result Value Ref Range Status   Adenovirus NOT DETECTED NOT DETECTED Final   Coronavirus 229E NOT DETECTED NOT DETECTED Final    Comment: (NOTE) The Coronavirus on the Respiratory Panel, DOES NOT test for the novel  Coronavirus (2019 nCoV)    Coronavirus HKU1 NOT DETECTED NOT DETECTED Final   Coronavirus NL63 NOT DETECTED NOT DETECTED Final   Coronavirus OC43 NOT DETECTED NOT DETECTED Final   Metapneumovirus NOT DETECTED NOT DETECTED Final   Rhinovirus / Enterovirus NOT DETECTED NOT DETECTED Final   Influenza A NOT DETECTED NOT DETECTED Final   Influenza B NOT DETECTED NOT DETECTED Final   Parainfluenza Virus 1 NOT DETECTED NOT DETECTED Final   Parainfluenza Virus 2 NOT DETECTED NOT DETECTED Final   Parainfluenza Virus 3 NOT DETECTED NOT DETECTED Final   Parainfluenza Virus 4 NOT DETECTED NOT DETECTED Final   Respiratory Syncytial Virus NOT DETECTED NOT DETECTED Final   Bordetella pertussis NOT DETECTED NOT DETECTED Final   Bordetella Parapertussis NOT DETECTED NOT DETECTED Final   Chlamydophila pneumoniae NOT DETECTED NOT DETECTED Final   Mycoplasma pneumoniae NOT DETECTED NOT DETECTED Final    Comment: Performed at [facility], [address], [place], [place] [address]  MRSA Next Gen by PCR, Nasal     Status: None   Collection Time: 09/20/22 10:21 AM   Specimen: Nasal Mucosa; Nasal Swab  Result Value Ref Range Status   MRSA by PCR Next Gen NOT DETECTED NOT DETECTED Final    Comment: (NOTE) The GeneXpert MRSA Assay (FDA approved for NASAL specimens  only), is one component of a comprehensive MRSA colonization surveillance program. It is not intended to diagnose MRSA infection nor to guide or monitor treatment for MRSA infections. Test performance is not FDA approved in patients less than 2 years old. Performed at [facility], [address] [place], [place] [address]     Antimicrobials: Anti-infectives (From admission, onward)    Start     Dose/Rate Route Frequency Ordered Stop   09/18/22 1100  vancomycin (VANCOREADY) IVPB 750 mg/150 mL        750 mg 150 mL/hr over 60 Minutes Intravenous Every 12 hours 09/17/22 2224     09/17/22 2245  vancomycin (VANCOREADY) IVPB 1500 mg/300 mL        1,500 mg 150 mL/hr over 120 Minutes Intravenous  Once 09/17/22 2148 09/18/22 0846   09/17/22 2230  ceFEPIme (MAXIPIME) 2 g in sodium chloride 0.9 % 100 mL IVPB        2 g 200 mL/hr over 30 Minutes Intravenous Every 8 hours 09/17/22 2130        Culture/Microbiology    Component Value Date/Time   SDES NASOPHARYNGEAL 09/17/2022 2131   SPECREQUEST NONE 09/17/2022 2131   CULT  09/17/2022 2131    NO MRSA DETECTED Performed at [facility], [address] [place], [place] [address]    REPTSTATUS 09/20/2022 FINAL 09/17/2022 2131  Other culture-see note  Radiology Studies: No results found.   LOS: 4 days   Lanae Boast, MD [facility]  09/21/2022, 10:29 AM

## 2022-09-21 NOTE — Progress Notes (Signed)
NAME:  Gary [name], MRN:  [id number], DOB:  1945/04/05, LOS: 4 ADMISSION DATE:  09/17/2022, CONSULTATION DATE:  9/13 REFERRING MD:  Lazarus Salines, CHIEF COMPLAINT:  ILD flare    History of Present Illness:  77 year old male who presented to the emergency room on 9/13 with chief complaint of 3-day history of worsening shortness of breath.  Has oxygen at home, but typically does not use or needed.  Noting pulse oximetry to drop into the 80s with any exertion, progressed to the point where he was short of breath just talking.  Did endorse mildly productive cough worse than his baseline chronic cough.  No fever chills or chest discomfort no nausea or vomiting no sick exposure.  COVID test outpatient was negative ER evaluation: CT chest was negative for pulmonary emboli.  There was progressive bilateral groundglass opacities throughout both lungs much worse than when comparing CT imaging 6 months ago BNP was 322, troponin I negative. He was placed on supplemental oxygen, pulse IV steroids, and broad-spectrum antibiotics. Pulmonary asked to evaluate for further recommendations on management  He has history of Wegener's diagnosed 5 years ago.  He has been on Rituxan for the past 5 years and chronic prednisone at 5 mg [initially on 60 mg of prednisone].  The Rituxan dose was increased 1 year ago due to poor control of symptoms  He used to run a Administrator, sports.  Quit smoking at the age of 76.  No significant exposures, travel history.  No family history of lung disease.  He was started on portable oxygen through the CPAP and with exertion 6 months ago during hospitalization for pneumonia.  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 rate 16, weight 104 kg, SpO2 92%.        Intake/Output Summary (Last 24 hours) at 09/21/2022 1011 Last data filed at 09/21/2022 0827 Gross per 24 hour  Intake 1194.07 ml  Output 500 ml  Net 694.07 ml   Filed Weights   09/19/22 0500 09/20/22 0500 09/21/22 0518  Weight: 104.3 kg 104.8 kg 104 kg    Examination: Sitting up in bed on 2 L nasal cannula with sats of 95% No JVD or lymphadenopathy is appreciated Decreased breath sounds throughout Heart sounds are regular regular rate and rhythm Abdomen is soft and nontender Extremities are without edema Neurologically intact although he does have a strange affect     Resolved Hospital Problem list     Assessment & Plan:   Acute hypoxic respiratory failure Wegener's granulomatosis ILD HFrEF  CAF on warfarin Coronary artery disease Hyperlipidemia Type 2 diabetes Hypertension OSA PAD  Pulmonary problem list Acute hypoxic respiratory failure secondary to progressive groundglass bilateral opacities.  Differential diagnosis includes flare of known underlying ILD.  Infection is less likely as his viral panel, PCT is negative.  In retrospect his episode of pneumonia in February 2024 may have been an ILD flare.  Now he has extensive NSIP opacities and likely some underlying fibrosis  Plan/rec Currently on 2 L nasal  cannula with sats of 95% which he considers normal Continue O2 to keep sats greater than 88% May need oxygen 4 L with activity Receiving high-dose steroids Complete antimicrobial therapy Questionable need for CellCept Continue Rituxan Not sure he needs O2 at rest   Best Practice (right click and "Reselect all SmartList  Selections" daily)   Per primary  Signature:    Brett Canales Keniya Schlotterbeck ACNP Acute Care Nurse Practitioner [facility]/Critical [facility] Please consult Amion 09/21/2022, 10:17 AM

## 2022-09-21 NOTE — Progress Notes (Addendum)
Patient walked in the hallway with out oxygen SPO2 dropped to lowest 80's. Placed him 2 lit [place] while he walked and SPO2 above 90%.

## 2022-09-21 NOTE — Progress Notes (Signed)
ANTICOAGULATION CONSULT NOTE   Pharmacy Consult for Warfarin Indication: atrial fibrillation  Allergies  Allergen Reactions   Farxiga [Dapagliflozin] Other (See Comments)    Lightheadedness Low blood pressure   Statins Other (See Comments)    Myalgias     Patient Measurements: Weight: 104 kg (229 lb 4.5 oz)  Vital Signs: Temp: 97.6 F (36.4 C) (09/17 0744) Temp Source: Oral (09/17 0744) BP: 123/66 (09/17 0744) Pulse Rate: 63 (09/17 0744)  Labs: Recent Labs    09/19/22 0741 09/20/22 0820 09/21/22 0742  HGB 12.5* 12.0* 11.6*  HCT 39.9 39.2 37.5*  PLT 509* 507* 482*  LABPROT 23.4* 30.8* 30.0*  INR 2.1* 2.9* 2.8*  CREATININE 0.96  --  0.99    Estimated Creatinine Clearance: 73.1 mL/min (by C-G formula based on SCr of 0.99 mg/dL).   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 M presents with SOB/PNA. Pt on warfarin PTA for afib. Admission INR 1.8. CBC ok on admission. Home dose: 2.5mg  M/W/F and 5mg  all other days (last dose taken 9/13).   INR increase to 2.8 today.    PTA 5mg  PO qday except 2.5mg  MWF  Goal of Therapy:  INR 2-3 Monitor platelets by anticoagulation protocol: Yes   Plan:  Warfarin 2.5 mg x 1  Monitor INR, CBC, and for s/sx of bleeding  Thank you Okey Regal, PharmD 09/21/2022 9:16 AM

## 2022-09-21 NOTE — Plan of Care (Signed)
  Problem: Education: Goal: Understanding of cardiac disease, CV risk reduction, and recovery process will improve Outcome: Progressing Goal: Individualized Educational Video(s) Outcome: Progressing   Problem: Activity: Goal: Ability to tolerate increased activity will improve Outcome: Progressing   Problem: Cardiac: Goal: Ability to achieve and maintain adequate cardiovascular perfusion will improve Outcome: Progressing   Problem: Health Behavior/Discharge Planning: Goal: Ability to safely manage health-related needs after discharge will improve Outcome: Progressing   Problem: Education: Goal: Understanding of CV disease, CV risk reduction, and recovery process will improve Outcome: Progressing Goal: Individualized Educational Video(s) Outcome: Progressing   Problem: Activity: Goal: Ability to return to baseline activity level will improve Outcome: Progressing   Problem: Cardiovascular: Goal: Ability to achieve and maintain adequate cardiovascular perfusion will improve Outcome: Progressing Goal: Vascular access site(s) Level 0-1 will be maintained Outcome: Progressing   Problem: Health Behavior/Discharge Planning: Goal: Ability to safely manage health-related needs after discharge will improve Outcome: Progressing   Problem: Education: Goal: Knowledge of cardiac device and self-care will improve Outcome: Progressing Goal: Ability to safely manage health related needs after discharge will improve Outcome: Progressing Goal: Individualized Educational Video(s) Outcome: Progressing   Problem: Cardiac: Goal: Ability to achieve and maintain adequate cardiopulmonary perfusion will improve Outcome: Progressing   Problem: Education: Goal: Knowledge of General Education information will improve Description: Including pain rating scale, medication(s)/side effects and non-pharmacologic comfort measures Outcome: Progressing   Problem: Health Behavior/Discharge  Planning: Goal: Ability to manage health-related needs will improve Outcome: Progressing   Problem: Clinical Measurements: Goal: Ability to maintain clinical measurements within normal limits will improve Outcome: Progressing Goal: Will remain free from infection Outcome: Progressing Goal: Diagnostic test results will improve Outcome: Progressing Goal: Respiratory complications will improve Outcome: Progressing Goal: Cardiovascular complication will be avoided Outcome: Progressing   Problem: Activity: Goal: Risk for activity intolerance will decrease Outcome: Progressing   Problem: Nutrition: Goal: Adequate nutrition will be maintained Outcome: Progressing   Problem: Coping: Goal: Level of anxiety will decrease Outcome: Progressing   Problem: Elimination: Goal: Will not experience complications related to bowel motility Outcome: Progressing Goal: Will not experience complications related to urinary retention Outcome: Progressing   Problem: Pain Managment: Goal: General experience of comfort will improve Outcome: Progressing   Problem: Safety: Goal: Ability to remain free from injury will improve Outcome: Progressing   Problem: Skin Integrity: Goal: Risk for impaired skin integrity will decrease Outcome: Progressing   Problem: Education: Goal: Ability to describe self-care measures that may prevent or decrease complications (Diabetes Survival Skills Education) will improve Outcome: Progressing Goal: Individualized Educational Video(s) Outcome: Progressing   Problem: Coping: Goal: Ability to adjust to condition or change in health will improve Outcome: Progressing   Problem: Fluid Volume: Goal: Ability to maintain a balanced intake and output will improve Outcome: Progressing   Problem: Health Behavior/Discharge Planning: Goal: Ability to identify and utilize available resources and services will improve Outcome: Progressing Goal: Ability to manage  health-related needs will improve Outcome: Progressing   Problem: Metabolic: Goal: Ability to maintain appropriate glucose levels will improve Outcome: Progressing   Problem: Nutritional: Goal: Maintenance of adequate nutrition will improve Outcome: Progressing Goal: Progress toward achieving an optimal weight will improve Outcome: Progressing   Problem: Skin Integrity: Goal: Risk for impaired skin integrity will decrease Outcome: Progressing   Problem: Tissue Perfusion: Goal: Adequacy of tissue perfusion will improve Outcome: Progressing

## 2022-09-21 NOTE — Progress Notes (Signed)
   09/21/22 2030  BiPAP/CPAP/SIPAP  BiPAP/CPAP/SIPAP Pt Type Adult  BiPAP/CPAP/SIPAP Resmed  Reason BIPAP/CPAP not in use Other(comment) (pt stated he will put his home CPAP on later. RT will check back.)  Mask Type Nasal mask  Mask Size Medium  Patient Home Equipment Yes  Auto Titrate No  Safety Check Completed by RT for Home Unit Yes, no issues noted  BiPAP/CPAP /SiPAP Vitals  Pulse Rate 62  SpO2 94 %  Bilateral Breath Sounds Clear;Diminished  MEWS Score/Color  MEWS Score 0  MEWS Score Color Green

## 2022-09-21 NOTE — Telephone Encounter (Signed)
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 Isaiah Serge or [name] in 1 month.   Thanks, JD

## 2022-09-22 ENCOUNTER — Other Ambulatory Visit (HOSPITAL_COMMUNITY): Payer: Self-pay

## 2022-09-22 ENCOUNTER — Ambulatory Visit: Payer: Medicare Other

## 2022-09-22 DIAGNOSIS — J9601 Acute respiratory failure with hypoxia: Secondary | ICD-10-CM | POA: Diagnosis not present

## 2022-09-22 LAB — CBC
HCT: 36.4 % — ABNORMAL LOW (ref 39.0–52.0)
Hemoglobin: 11.1 g/dL — ABNORMAL LOW (ref 13.0–17.0)
MCH: 24.5 pg — ABNORMAL LOW (ref 26.0–34.0)
MCHC: 30.5 g/dL (ref 30.0–36.0)
MCV: 80.4 fL (ref 80.0–100.0)
Platelets: 444 10*3/uL — ABNORMAL HIGH (ref 150–400)
RBC: 4.53 MIL/uL (ref 4.22–5.81)
RDW: 17.5 % — ABNORMAL HIGH (ref 11.5–15.5)
WBC: 16.8 10*3/uL — ABNORMAL HIGH (ref 4.0–10.5)
nRBC: 0.1 % (ref 0.0–0.2)

## 2022-09-22 LAB — BASIC METABOLIC PANEL WITH GFR
Anion gap: 9 (ref 5–15)
BUN: 31 mg/dL — ABNORMAL HIGH (ref 8–23)
CO2: 25 mmol/L (ref 22–32)
Calcium: 8.5 mg/dL — ABNORMAL LOW (ref 8.9–10.3)
Chloride: 106 mmol/L (ref 98–111)
Creatinine, Ser: 0.87 mg/dL (ref 0.61–1.24)
GFR, Estimated: >60 mL/min (ref >60)
Glucose, Bld: 93 mg/dL (ref 70–99)
Potassium: 3.3 mmol/L — ABNORMAL LOW (ref 3.5–5.1)
Sodium: 140 mmol/L (ref 135–145)

## 2022-09-22 LAB — PROTIME-INR
INR: 3.1 — ABNORMAL HIGH (ref 0.8–1.2)
Prothrombin Time: 32.1 s — ABNORMAL HIGH (ref 11.4–15.2)

## 2022-09-22 LAB — GLUCOSE, CAPILLARY
Glucose-Capillary: 99 mg/dL (ref 70–99)
Glucose-Capillary: 205 mg/dL — ABNORMAL HIGH (ref 70–99)

## 2022-09-22 MED ORDER — ATOVAQUONE 750 MG/5ML PO SUSP
1500.0000 mg | Freq: Every day | ORAL | 0 refills | Status: AC
  Filled 2022-09-22: qty 300, 30d supply, fill #0

## 2022-09-22 MED ORDER — WARFARIN 0.5 MG HALF TABLET
0.5000 mg | ORAL_TABLET | Freq: Once | ORAL | Status: AC
  Administered 2022-09-22: 0.5 mg via ORAL
  Filled 2022-09-22: qty 1

## 2022-09-22 MED ORDER — PREDNISONE 20 MG PO TABS
ORAL_TABLET | ORAL | 0 refills | Status: DC
  Filled 2022-09-22: qty 102, 55d supply, fill #0

## 2022-09-22 MED ORDER — POTASSIUM CHLORIDE CRYS ER 20 MEQ PO TBCR
40.0000 meq | EXTENDED_RELEASE_TABLET | Freq: Once | ORAL | Status: AC
  Administered 2022-09-22: 40 meq via ORAL
  Filled 2022-09-22: qty 2

## 2022-09-22 NOTE — TOC Benefit Eligibility Note (Signed)
Patient Product/process development scientist completed.    The patient is insured through [employer]. Patient has Medicare and is not eligible for a copay card, but may be able to apply for patient assistance, if available.    Ran test claim for atovaquone (Mepron) 750 mg/5 ml susp and the current 30 day co-pay is $297.13 due to a deductible.   This test claim was processed through [facility]- copay amounts may vary at other pharmacies due to pharmacy/plan contracts, or as the patient moves through the different stages of their insurance plan.     Roland Earl, CPHT Pharmacy Technician III Certified Patient Advocate [facility] Patient Advocate Team Direct Number: [phone]  Fax: [phone]

## 2022-09-22 NOTE — Progress Notes (Signed)
NAME:  Gary [name], MRN:  [id number], DOB:  1945-05-13, LOS: 5 ADMISSION DATE:  09/17/2022, CONSULTATION DATE:  9/13 REFERRING MD:  Lazarus Salines, CHIEF COMPLAINT:  ILD flare    History of Present Illness:  77 year old male who presented to the emergency room on 9/13 with chief complaint of 3-day history of worsening shortness of breath.  Has oxygen at home, but typically does not use or needed.  Noting pulse oximetry to drop into the 80s with any exertion, progressed to the point where he was short of breath just talking.  Did endorse mildly productive cough worse than his baseline chronic cough.  No fever chills or chest discomfort no nausea or vomiting no sick exposure.  COVID test outpatient was negative ER evaluation: CT chest was negative for pulmonary emboli.  There was progressive bilateral groundglass opacities throughout both lungs much worse than when comparing CT imaging 6 months ago BNP was 322, troponin I negative.  He was placed on supplemental oxygen, pulse IV steroids, and broad-spectrum antibiotics. Pulmonary asked to evaluate for further recommendations on management  He has history of Wegener's diagnosed 5 years ago.  He has been on Rituxan for the past 5 years and chronic prednisone at 5 mg [initially on 60 mg of prednisone].  The Rituxan dose was increased 1 year ago due to poor control of symptoms  He used to run a Administrator, sports.  Quit smoking at the age of 12.  No significant exposures, travel history.  No family history of lung disease.  He was started on portable oxygen through the CPAP and with exertion 6 months ago during hospitalization for pneumonia.  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 9/14 Pulm consulted pulse dose steroids started  9/17 pulse dose steroids weaned to prolonged taper, prophylactic bactrim started  9/18 Sitting up in chair ready for discharge    Interim History / Subjective:  Sates he feels well with no acute complaints   Objective   Blood pressure (!) 132/95, pulse (!) 56, temperature 98.1 F (36.7 C), temperature source Oral, resp. rate 18, weight 103.9 kg, SpO2 96%.        Intake/Output Summary (Last 24 hours) at 09/22/2022 1019 Last data filed at 09/22/2022 0300 Gross per 24 hour  Intake 700.04 ml  Output --  Net 700.04 ml   Filed Weights   09/20/22 0500 09/21/22 0518 09/22/22 0520  Weight: 104.8 kg 104 kg 103.9 kg    Examination: General: Well chronically ill appearing elderly male sitting up in bedside recliner, in NAD HEENT: [place]/AT, MM pink/moist, PERRL,  Neuro: Alert and oriented x3, non-focal  CV: s1s2 regular rate and rhythm, no murmur, rubs, or gallops,  PULM:  Clear to auscultation, no increased work of breathing, no added breath sounds  GI: soft, bowel sounds active in all 4 quadrants, non-tender, non-distended Extremities: warm/dry, no edema  Skin: no rashes or lesions  Resolved Hospital Problem list     Assessment & Plan:   Acute hypoxic respiratory failure Wegener's granulomatosis ILD HFrEF  CAF on warfarin Coronary artery disease Hyperlipidemia Type 2 diabetes Hypertension  OSA PAD  Pulmonary problem list Acute hypoxic respiratory failure in 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  -Started on pulse dose steroids 9/14 Plan: Continue with prolonged steroid wean  Prophylactics Bactrim for pneumocystis pneumonia prophylaxis  Follow up with [name]  11/04/22 to determine need for CellCept therapy  Continue supplemental oxygen   Best Practice (right click and "Reselect all SmartList Selections" daily)   Per primary  Signature:   Susana Gripp D. [name], NP-C [name] Personal contact information can be found on Amion  If no contact or response made please call [phone] 09/22/2022, 10:31 AM

## 2022-09-22 NOTE — Discharge Summary (Signed)
Physician Discharge Summary  Gary [name] GEX:[id number] DOB: 19-Feb-1945 DOA: 09/17/2022  PCP: Charlane Ferretti, DO  Admit date: 09/17/2022 Discharge date: 09/22/2022  Admitted From: Home Disposition:  Home  Discharge Condition:Stable CODE STATUS:FULL, DNR, Comfort Care Diet recommendation: Heart Healthy / Carb Modified / Regular / Dysphagia   Brief/Interim Summary:   Following problems were addressed during the hospitalization:   Discharge Diagnoses:  Principal Problem:   Acute hypoxemic respiratory failure (HCC) Active Problems:   ILD (interstitial lung disease) (HCC)    Discharge Instructions  Discharge Instructions     Diet - low sodium heart healthy   Complete by: As directed    Discharge instructions   Complete by: As directed    1)Please take prescribed medications as instructed 2)Follow up with your PCP in a week.  Follow-up with a pulmonologist as an outpatient.   Increase activity slowly   Complete by: As directed       Allergies as of 09/22/2022       Reactions   Farxiga [dapagliflozin] Other (See Comments)   Lightheadedness Low blood pressure   Statins Other (See Comments)   Myalgias         Medication List     TAKE these medications    ALPRAZolam 0.5 MG tablet Commonly known as: XANAX Take 0.5 mg by mouth at bedtime. Takes at 11:00pm   amLODipine 2.5 MG tablet Commonly known as: NORVASC Take 2.5 mg by mouth daily. Takes at 6:30am   atovaquone 750 MG/5ML suspension Commonly known as: MEPRON Take 10 mLs (1,500 mg total) by mouth daily with breakfast. Start taking on: September 23, 2022   carvedilol 25 MG tablet Commonly known as: COREG Take 0.5 tablets (12.5 mg total) by mouth 2 (two) times daily.   Centrum Silver 50+Men Tabs Take 1 tablet by mouth daily.   CHELATED ZINC PO Take 1 tablet by mouth daily.   clopidogrel 75 MG tablet Commonly known as: PLAVIX Take 1 tablet (75 mg total) by mouth daily with breakfast. Take every  day for 1 year What changed:  when to take this additional instructions   COQ10 PO Take 1 capsule by mouth daily.   dofetilide 500 MCG capsule Commonly known as: TIKOSYN Take 1 capsule (500 mcg total) by mouth 2 (two) times daily.   famotidine 20 MG tablet Commonly known as: Pepcid Take 1 tablet (20 mg total) by mouth 2 (two) times daily.   ferrous sulfate 325 (65 FE) MG EC tablet Take 325 mg by mouth every Monday, Wednesday, and Friday.   furosemide 40 MG tablet Commonly known as: Lasix Take 1 tablet (40 mg total) by mouth daily as needed.   Klor-Con M20 20 MEQ tablet Generic drug: potassium chloride SA TAKE 2 TABLETS BY MOUTH DAILY What changed: how much to take   levothyroxine 137 MCG tablet Commonly known as: SYNTHROID Take 137 mcg by mouth daily before breakfast.   loratadine 10 MG tablet Commonly known as: CLARITIN Take 10 mg by mouth daily.   metFORMIN 500 MG tablet Commonly known as: GLUCOPHAGE Take 500 mg by mouth 2 (two) times daily.   nitroGLYCERIN 0.4 MG SL tablet Commonly known as: NITROSTAT Place 1 tablet (0.4 mg total) under the tongue every 5 (five) minutes x 3 doses as needed for chest pain.   pantoprazole 40 MG tablet Commonly known as: PROTONIX Take 40 mg by mouth daily.   predniSONE 20 MG tablet Commonly known as: DELTASONE Take 3 tablets (60 mg total) by mouth  daily with breakfast for 13 days, THEN 2 tablets (40 mg total) daily with breakfast for 14 days, THEN 1.5 tablets (30 mg total) daily with breakfast for 14 days, THEN 1 tablet (20 mg total) daily with breakfast for 14 days. Start taking on: September 23, 2022 What changed:  medication strength See the new instructions. Another medication with the same name was removed. Continue taking this medication, and follow the directions you see here.   Repatha SureClick 140 MG/ML Soaj Generic drug: Evolocumab INJECT 140 MG INTO THE SKIN EVERY 14 (FOURTEEN) DAYS. INJECT 1 PEN INTO THE FATTY  TISSUE SKIN EVERY 14 DAY   RITUXAN IV Inject 1 application  into the vein See admin instructions. Take every two weeks and every six months alternating   testosterone cypionate 200 MG/ML injection Commonly known as: DEPOTESTOSTERONE CYPIONATE Inject 150 mg into the muscle every 14 (fourteen) days.   venlafaxine XR 150 MG 24 hr capsule Commonly known as: EFFEXOR-XR Take 150 mg by mouth daily.   VITAMIN D-3 PO Take 1 capsule by mouth daily.   warfarin 5 MG tablet Commonly known as: COUMADIN Take as directed. If you are unsure how to take this medication, talk to your nurse or doctor. Original instructions: TAKE 1/2 TABLET TO 1 TABLET BY MOUTH DAILY AS DIRECTED BY COUMADIN CLINIC What changed: See the new instructions.        Follow-up Information     Charlane Ferretti, DO Follow up.   Specialty: Internal Medicine Contact information: [address] [place] [address] [phone]         Kalman Shan, MD. Schedule an appointment as soon as possible for a visit.   Specialty: Pulmonary Disease Contact information: [address] [place] [address] [phone]                Allergies  Allergen Reactions   Marcelline Deist [Dapagliflozin] Other (See Comments)    Lightheadedness Low blood pressure   Statins Other (See Comments)    Myalgias     Consultations:    Procedures/Studies: CT Angio Chest PE W/Cm &/Or Wo Cm  Result Date: 09/17/2022 CLINICAL DATA:  Shortness of breath, evaluate for PE EXAM: CT ANGIOGRAPHY CHEST WITH CONTRAST TECHNIQUE: Multidetector CT imaging of the chest was performed using the standard protocol during bolus administration of intravenous contrast. Multiplanar CT image reconstructions and MIPs were obtained to evaluate the vascular anatomy. RADIATION DOSE REDUCTION: This exam was performed according to the departmental dose-optimization program which includes automated exposure control, adjustment of the mA and/or kV  according to patient size and/or use of iterative reconstruction technique. CONTRAST:  OMNIPAQUE IOHEXOL 350 MG/ML SOLN COMPARISON:  Chest radiograph dated 09/17/2022 FINDINGS: Cardiovascular: Satisfactory opacification the bilateral pulmonary arteries to the segmental level. No evidence of pulmonary embolism Study is not tailored for evaluation of the thoracic aorta. No evidence of thoracic aortic aneurysm or dissection. Cardiomegaly.  No pericardial effusion. Severe three-vessel coronary atherosclerosis with LAD stents. Mediastinum/Nodes: Small mediastinal nodes, including a dominant 15 mm short axis right hilar node. Visualized thyroid is unremarkable. Lungs/Pleura: Progressive patchy ground-glass opacities in the lungs bilaterally, lower lobe and subpleural/peripheral predominant, compatible with progressive chronic interstitial lung disease. Associated mild subpleural reticulation/fibrosis. 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 Superimposed mild patchy opacities in the anterior left upper lobe (series 6/image 55), grossly unchanged from the prior, likely reflecting scarring. No focal consolidation. No suspicious pulmonary nodules. Mild paraseptal emphysematous changes in the  upper lobes. No pleural effusion or pneumothorax. Upper Abdomen: Visualized upper abdomen is notable for tiny layering gallstones and vascular calcifications. Musculoskeletal: Degenerative changes of the visualized thoracolumbar spine. Median sternotomy. Review of the MIP images confirms the above findings. IMPRESSION: No evidence of pulmonary embolism. Progressive chronic interstitial lung disease, favoring fibrotic NSIP. Aortic Atherosclerosis (ICD10-I70.0) and Emphysema (ICD10-J43.9). Electronically Signed   By: Charline Bills M.D.   On: 09/17/2022 17:13   DG Chest 2 View  Result Date: 09/17/2022 CLINICAL DATA:  Shortness of breath EXAM: CHEST - 2 VIEW COMPARISON:   04/07/2022 FINDINGS: Status post median sternotomy. Stable cardiomegaly. Similar heterogeneous bilateral opacities. No pleural effusion or pneumothorax. No acute osseous abnormality. IMPRESSION: Cardiomegaly with similar heterogeneous bilateral opacities, which could represent pulmonary edema or infection. Electronically Signed   By: Wiliam Ke M.D.   On: 09/17/2022 16:12      Subjective:   Discharge Exam: Vitals:   09/22/22 0811 09/22/22 1430  BP: (!) 132/95 (!) 145/70  Pulse: (!) 56 63  Resp:    Temp:    SpO2: 96% 99%   Vitals:   09/21/22 2109 09/22/22 0520 09/22/22 0811 09/22/22 1430  BP: (!) 118/59 (!) 116/59 (!) 132/95 (!) 145/70  Pulse: (!) 58 (!) 59 (!) 56 63  Resp: 17 18    Temp: 97.7 F (36.5 C) 98.1 F (36.7 C)    TempSrc: Oral Oral    SpO2: 92% (!) 85% 96% 99%  Weight:  103.9 kg      General: Pt is alert, awake, not in acute distress Cardiovascular: RRR, S1/S2 +, no rubs, no gallops Respiratory: CTA bilaterally, no wheezing, no rhonchi Abdominal: Soft, NT, ND, bowel sounds + Extremities: no edema, no cyanosis    The results of significant diagnostics from this hospitalization (including imaging, microbiology, ancillary and laboratory) are listed below for reference.     Microbiology: Recent Results (from the past 240 hour(s))  Resp panel by RT-PCR (RSV, Flu A&B, Covid) Anterior Nasal Swab     Status: None   Collection Time: 09/17/22  2:48 PM   Specimen: Anterior Nasal Swab  Result Value Ref Range Status   SARS Coronavirus 2 by RT PCR NEGATIVE NEGATIVE Final    Comment: (NOTE) SARS-CoV-2 target nucleic acids are NOT DETECTED.  The SARS-CoV-2 RNA is generally detectable in upper respiratory specimens during the acute phase of infection. The lowest concentration of SARS-CoV-2 viral copies this assay can detect is 138 copies/mL. A negative result does not preclude SARS-Cov-2 infection and should not be used as the sole basis for treatment or other  patient management decisions. A negative result may occur with  improper specimen collection/handling, submission of specimen other than nasopharyngeal swab, presence of viral mutation(s) within the areas targeted by this assay, and inadequate number of viral copies(<138 copies/mL). A negative result must be combined with clinical observations, patient history, and epidemiological information. The expected result is Negative.  Fact Sheet for Patients:  BloggerCourse.com  Fact Sheet for Healthcare Providers:  SeriousBroker.it  This test is no t yet approved or cleared by the [place] FDA and  has been authorized for detection and/or diagnosis of SARS-CoV-2 by FDA under an Emergency Use Authorization (EUA). This EUA will remain  in effect (meaning this test can be used) for the duration of the COVID-19 declaration under Section 564(b)(1) of the Act, 21 U.S.C.section 360bbb-3(b)(1), unless the authorization is terminated  or revoked sooner.       Influenza A by PCR NEGATIVE NEGATIVE  Final   Influenza B by PCR NEGATIVE NEGATIVE Final    Comment: (NOTE) The Xpert Xpress SARS-CoV-2/FLU/RSV plus assay is intended as an aid in the diagnosis of influenza from Nasopharyngeal swab specimens and should not be used as a sole basis for treatment. Nasal washings and aspirates are unacceptable for Xpert Xpress SARS-CoV-2/FLU/RSV testing.  Fact Sheet for Patients: BloggerCourse.com  Fact Sheet for Healthcare Providers: SeriousBroker.it  This test is not yet approved or cleared by the [place] FDA and has been authorized for detection and/or diagnosis of SARS-CoV-2 by FDA under an Emergency Use Authorization (EUA). This EUA will remain in effect (meaning this test can be used) for the duration of the COVID-19 declaration under Section 564(b)(1) of the Act, 21 U.S.C. section  360bbb-3(b)(1), unless the authorization is terminated or revoked.     Resp Syncytial Virus by PCR NEGATIVE NEGATIVE Final    Comment: (NOTE) Fact Sheet for Patients: BloggerCourse.com  Fact Sheet for Healthcare Providers: SeriousBroker.it  This test is not yet approved or cleared by the [place] FDA and has been authorized for detection and/or diagnosis of SARS-CoV-2 by FDA under an Emergency Use Authorization (EUA). This EUA will remain in effect (meaning this test can be used) for the duration of the COVID-19 declaration under Section 564(b)(1) of the Act, 21 U.S.C. section 360bbb-3(b)(1), unless the authorization is terminated or revoked.  Performed at [facility], [address], [place], [place] [address]   MRSA culture     Status: None   Collection Time: 09/17/22  9:31 PM   Specimen: Nasal Mucosa; Body Fluid  Result Value Ref Range Status   Specimen Description NASOPHARYNGEAL  Final   Special Requests NONE  Final   Culture   Final    NO MRSA DETECTED Performed at [facility], [address] [place], [place] [address]    Report Status 09/20/2022 FINAL  Final  Respiratory (~20 pathogens) panel by PCR     Status: None   Collection Time: 09/17/22 11:18 PM   Specimen: Nasopharyngeal Swab; Respiratory  Result Value Ref Range Status   Adenovirus NOT DETECTED NOT DETECTED Final   Coronavirus 229E NOT DETECTED NOT DETECTED Final    Comment: (NOTE) The Coronavirus on the Respiratory Panel, DOES NOT test for the novel  Coronavirus (2019 nCoV)    Coronavirus HKU1 NOT DETECTED NOT DETECTED Final   Coronavirus NL63 NOT DETECTED NOT DETECTED Final   Coronavirus OC43 NOT DETECTED NOT DETECTED Final   Metapneumovirus NOT DETECTED NOT DETECTED Final   Rhinovirus / Enterovirus NOT DETECTED NOT DETECTED Final   Influenza A NOT DETECTED NOT DETECTED Final   Influenza B NOT DETECTED NOT DETECTED  Final   Parainfluenza Virus 1 NOT DETECTED NOT DETECTED Final   Parainfluenza Virus 2 NOT DETECTED NOT DETECTED Final   Parainfluenza Virus 3 NOT DETECTED NOT DETECTED Final   Parainfluenza Virus 4 NOT DETECTED NOT DETECTED Final   Respiratory Syncytial Virus NOT DETECTED NOT DETECTED Final   Bordetella pertussis NOT DETECTED NOT DETECTED Final   Bordetella Parapertussis NOT DETECTED NOT DETECTED Final   Chlamydophila pneumoniae NOT DETECTED NOT DETECTED Final   Mycoplasma pneumoniae NOT DETECTED NOT DETECTED Final    Comment: Performed at [facility], [address] [place], [place] [address]  MRSA Next Gen by PCR, Nasal     Status: None   Collection Time: 09/20/22 10:21 AM   Specimen: Nasal Mucosa; Nasal Swab  Result Value Ref Range Status  MRSA by PCR Next Gen NOT DETECTED NOT DETECTED Final    Comment: (NOTE) The GeneXpert MRSA Assay (FDA approved for NASAL specimens only), is one component of a comprehensive MRSA colonization surveillance program. It is not intended to diagnose MRSA infection nor to guide or monitor treatment for MRSA infections. Test performance is not FDA approved in patients less than 28 years old. Performed at [facility], [address] [place], [place] [address]      Labs: BNP (last 3 results) Recent Labs    03/01/22 0945 09/17/22 1448  BNP 471.9* 322.1*   Basic Metabolic Panel: Recent Labs  Lab 09/17/22 1448 09/17/22 1512 09/18/22 0706 09/19/22 0741 09/21/22 0742 09/22/22 0810  NA 139 141 140 139 141 140  K 4.3 4.2 4.0 3.6 3.2* 3.3*  CL 105  --  106 107 107 106  CO2 24  --  23 22 22 25   GLUCOSE 107*  --  139* 150* 161* 93  BUN 16  --  16 21 30* 31*  CREATININE 0.90  --  0.89 0.96 0.99 0.87  CALCIUM 8.7*  --  8.9 8.9 8.8* 8.5*  MG  --   --  2.1 2.1  --   --   PHOS  --   --  2.8  --   --   --    Liver Function Tests: Recent Labs  Lab 09/17/22 1448 09/19/22 0741  AST 28 26  ALT 18 21  ALKPHOS 116 99  BILITOT 0.6  0.7  PROT 6.9 6.9  ALBUMIN 3.9 3.4*   No results for input(s): "LIPASE", "AMYLASE" in the last 168 hours. No results for input(s): "AMMONIA" in the last 168 hours. CBC: Recent Labs  Lab 09/17/22 1448 09/17/22 1512 09/18/22 0706 09/19/22 0741 09/20/22 0820 09/21/22 0742 09/22/22 0810  WBC 10.6*  --  10.1 18.3* 18.2* 17.0* 16.8*  NEUTROABS 8.7*  --   --   --   --   --   --   HGB 11.8*   < > 12.1* 12.5* 12.0* 11.6* 11.1*  HCT 37.2*   < > 39.3 39.9 39.2 37.5* 36.4*  MCV 79.1*  --  80.0 81.4 80.3 79.3* 80.4  PLT 436*  --  458* 509* 507* 482* 444*   < > = values in this interval not displayed.   Cardiac Enzymes: No results for input(s): "CKTOTAL", "CKMB", "CKMBINDEX", "TROPONINI" in the last 168 hours. BNP: Invalid input(s): "POCBNP" CBG: Recent Labs  Lab 09/21/22 1142 09/21/22 1712 09/21/22 2112 09/22/22 0813 09/22/22 1131  GLUCAP 174* 140* 148* 99 205*   D-Dimer No results for input(s): "DDIMER" in the last 72 hours. Hgb A1c No results for input(s): "HGBA1C" in the last 72 hours. Lipid Profile No results for input(s): "CHOL", "HDL", "LDLCALC", "TRIG", "CHOLHDL", "LDLDIRECT" in the last 72 hours. Thyroid function studies No results for input(s): "TSH", "T4TOTAL", "T3FREE", "THYROIDAB" in the last 72 hours.  Invalid input(s): "FREET3" Anemia work up No results for input(s): "VITAMINB12", "FOLATE", "FERRITIN", "TIBC", "IRON", "RETICCTPCT" in the last 72 hours. Urinalysis    Component Value Date/Time   COLORURINE YELLOW 09/17/2022 2236   APPEARANCEUR HAZY (A) 09/17/2022 2236   LABSPEC 1.010 09/17/2022 2236   PHURINE 6.5 09/17/2022 2236   GLUCOSEU NEGATIVE 09/17/2022 2236   HGBUR NEGATIVE 09/17/2022 2236   BILIRUBINUR NEGATIVE 09/17/2022 2236   KETONESUR NEGATIVE 09/17/2022 2236   PROTEINUR NEGATIVE 09/17/2022 2236   UROBILINOGEN 0.2 12/24/2011 0805   NITRITE NEGATIVE 09/17/2022 2236  LEUKOCYTESUR SMALL (A) 09/17/2022 2236   Sepsis Labs Recent Labs  Lab  09/19/22 0741 09/20/22 0820 09/21/22 0742 09/22/22 0810  WBC 18.3* 18.2* 17.0* 16.8*   Microbiology Recent Results (from the past 240 hour(s))  Resp panel by RT-PCR (RSV, Flu A&B, Covid) Anterior Nasal Swab     Status: None   Collection Time: 09/17/22  2:48 PM   Specimen: Anterior Nasal Swab  Result Value Ref Range Status   SARS Coronavirus 2 by RT PCR NEGATIVE NEGATIVE Final    Comment: (NOTE) SARS-CoV-2 target nucleic acids are NOT DETECTED.  The SARS-CoV-2 RNA is generally detectable in upper respiratory specimens during the acute phase of infection. The lowest concentration of SARS-CoV-2 viral copies this assay can detect is 138 copies/mL. A negative result does not preclude SARS-Cov-2 infection and should not be used as the sole basis for treatment or other patient management decisions. A negative result may occur with  improper specimen collection/handling, submission of specimen other than nasopharyngeal swab, presence of viral mutation(s) within the areas targeted by this assay, and inadequate number of viral copies(<138 copies/mL). A negative result must be combined with clinical observations, patient history, and epidemiological information. The expected result is Negative.  Fact Sheet for Patients:  BloggerCourse.com  Fact Sheet for Healthcare Providers:  SeriousBroker.it  This test is no t yet approved or cleared by the [place] FDA and  has been authorized for detection and/or diagnosis of SARS-CoV-2 by FDA under an Emergency Use Authorization (EUA). This EUA will remain  in effect (meaning this test can be used) for the duration of the COVID-19 declaration under Section 564(b)(1) of the Act, 21 U.S.C.section 360bbb-3(b)(1), unless the authorization is terminated  or revoked sooner.       Influenza A by PCR NEGATIVE NEGATIVE Final   Influenza B by PCR NEGATIVE NEGATIVE Final    Comment: (NOTE) The  Xpert Xpress SARS-CoV-2/FLU/RSV plus assay is intended as an aid in the diagnosis of influenza from Nasopharyngeal swab specimens and should not be used as a sole basis for treatment. Nasal washings and aspirates are unacceptable for Xpert Xpress SARS-CoV-2/FLU/RSV testing.  Fact Sheet for Patients: BloggerCourse.com  Fact Sheet for Healthcare Providers: SeriousBroker.it  This test is not yet approved or cleared by the [place] FDA and has been authorized for detection and/or diagnosis of SARS-CoV-2 by FDA under an Emergency Use Authorization (EUA). This EUA will remain in effect (meaning this test can be used) for the duration of the COVID-19 declaration under Section 564(b)(1) of the Act, 21 U.S.C. section 360bbb-3(b)(1), unless the authorization is terminated or revoked.     Resp Syncytial Virus by PCR NEGATIVE NEGATIVE Final    Comment: (NOTE) Fact Sheet for Patients: BloggerCourse.com  Fact Sheet for Healthcare Providers: SeriousBroker.it  This test is not yet approved or cleared by the [place] FDA and has been authorized for detection and/or diagnosis of SARS-CoV-2 by FDA under an Emergency Use Authorization (EUA). This EUA will remain in effect (meaning this test can be used) for the duration of the COVID-19 declaration under Section 564(b)(1) of the Act, 21 U.S.C. section 360bbb-3(b)(1), unless the authorization is terminated or revoked.  Performed at [facility], [address], [place], [place] [address]   MRSA culture     Status: None   Collection Time: 09/17/22  9:31 PM   Specimen: Nasal Mucosa; Body Fluid  Result Value Ref Range Status   Specimen Description NASOPHARYNGEAL  Final   Special Requests NONE  Final   Culture   Final    NO MRSA DETECTED Performed at [facility], [address], [place], [place] [address]     Report Status 09/20/2022 FINAL  Final  Respiratory (~20 pathogens) panel by PCR     Status: None   Collection Time: 09/17/22 11:18 PM   Specimen: Nasopharyngeal Swab; Respiratory  Result Value Ref Range Status   Adenovirus NOT DETECTED NOT DETECTED Final   Coronavirus 229E NOT DETECTED NOT DETECTED Final    Comment: (NOTE) The Coronavirus on the Respiratory Panel, DOES NOT test for the novel  Coronavirus (2019 nCoV)    Coronavirus HKU1 NOT DETECTED NOT DETECTED Final   Coronavirus NL63 NOT DETECTED NOT DETECTED Final   Coronavirus OC43 NOT DETECTED NOT DETECTED Final   Metapneumovirus NOT DETECTED NOT DETECTED Final   Rhinovirus / Enterovirus NOT DETECTED NOT DETECTED Final   Influenza A NOT DETECTED NOT DETECTED Final   Influenza B NOT DETECTED NOT DETECTED Final   Parainfluenza Virus 1 NOT DETECTED NOT DETECTED Final   Parainfluenza Virus 2 NOT DETECTED NOT DETECTED Final   Parainfluenza Virus 3 NOT DETECTED NOT DETECTED Final   Parainfluenza Virus 4 NOT DETECTED NOT DETECTED Final   Respiratory Syncytial Virus NOT DETECTED NOT DETECTED Final   Bordetella pertussis NOT DETECTED NOT DETECTED Final   Bordetella Parapertussis NOT DETECTED NOT DETECTED Final   Chlamydophila pneumoniae NOT DETECTED NOT DETECTED Final   Mycoplasma pneumoniae NOT DETECTED NOT DETECTED Final    Comment: Performed at [facility], [address] [place], [place] [address]  MRSA Next Gen by PCR, Nasal     Status: None   Collection Time: 09/20/22 10:21 AM   Specimen: Nasal Mucosa; Nasal Swab  Result Value Ref Range Status   MRSA by PCR Next Gen NOT DETECTED NOT DETECTED Final    Comment: (NOTE) The GeneXpert MRSA Assay (FDA approved for NASAL specimens only), is one component of a comprehensive MRSA colonization surveillance program. It is not intended to diagnose MRSA infection nor to guide or monitor treatment for MRSA infections. Test performance is not FDA approved in patients less than 1  years old. Performed at [facility], [address], [place], [place] [address]     Please note: You were cared for by a hospitalist during your hospital stay. Once you are discharged, your primary care physician will handle any further medical issues. Please note that NO REFILLS for any discharge medications will be authorized once you are discharged, as it is imperative that you return to your primary care physician (or establish a relationship with a primary care physician if you do not have one) for your post hospital discharge needs so that they can reassess your need for medications and monitor your lab values.    Time coordinating discharge: 40 minutes  SIGNED:   Burnadette Pop, MD  [facility] 09/22/2022, 3:17 PM Pager [phone]  If 7PM-7AM, please contact night-coverage [email or web address] Password [email or web address]

## 2022-09-22 NOTE — Care Management (Signed)
Spoke with patient at bedside. He states that he has home oxygen that he uses at night, home oxygen concentrator, a portable unit that goes up to 4L, lasts 4 hours and CPAP.  He has needed oxygen at home. Per chart note his oxygen is through [facility]. He states that his son will provide transportation home.

## 2022-09-22 NOTE — Progress Notes (Signed)
ANTICOAGULATION CONSULT NOTE   Pharmacy Consult for Warfarin Indication: atrial fibrillation  Allergies  Allergen Reactions   Farxiga [Dapagliflozin] Other (See Comments)    Lightheadedness Low blood pressure   Statins Other (See Comments)    Myalgias     Patient Measurements: Weight: 103.9 kg (229 lb)  Vital Signs: Temp: 98.1 F (36.7 C) (09/18 0520) Temp Source: Oral (09/18 0520) BP: 132/95 (09/18 0811) Pulse Rate: 56 (09/18 0811)  Labs: Recent Labs    09/20/22 0820 09/21/22 0742 09/22/22 0810  HGB 12.0* 11.6* 11.1*  HCT 39.2 37.5* 36.4*  PLT 507* 482* 444*  LABPROT 30.8* 30.0* 32.1*  INR 2.9* 2.8* 3.1*  CREATININE  --  0.99 0.87    Estimated Creatinine Clearance: 83.2 mL/min (by C-G formula based on SCr of 0.87 mg/dL).   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 M presents with SOB/PNA. Pt on warfarin PTA for afib. Admission INR 1.8. CBC ok on admission. Home dose: 2.5mg  M/W/F and 5mg  all other days (last dose taken 9/13).   INR increase to 3.1 today  PTA 5mg  PO qday except 2.5mg  MWF  Goal of Therapy:  INR 2-3 Monitor platelets by anticoagulation protocol: Yes   Plan:  Small dose of warfarin today 0.5 mg po x 1 Monitor INR, CBC, and for s/sx of bleeding  Thank you Okey Regal, PharmD 09/22/2022 11:06 AM

## 2022-09-23 ENCOUNTER — Telehealth: Payer: Self-pay | Admitting: *Deleted

## 2022-09-23 DIAGNOSIS — M3130 Wegener's granulomatosis without renal involvement: Secondary | ICD-10-CM | POA: Diagnosis not present

## 2022-09-23 NOTE — Telephone Encounter (Signed)
Pt called to report he was discharged from hospital on yesterday.  Hospital 9/13-9/18/24, was d/c on prednisone 20mg  taking 3 tablets by mouth daily with breakfast for 13 days, THEN 2 tablets daily with breakfast for 14 days, THEN 1.5 tablets daily with breakfast for 14 days, THEN 1 tablet daily with breakfast for 14 days. Also, taking mepron 750mg  susp & taking 10 mLs (1,500 mg total) by mouth daily with breakfast.   Both meds above can cause INR to increase so will need to be monitored closely and have extra leafy veggies.   Advised since taking prednisone 60mg  currently for next 13 days to take 1/2 tablet/2.5mg  of warfarin until INR check on Monday 3pm and he verbalized understanding.   Warfarin dose in hospital  9/14-6mg  9/15-5mg  9/16-1mg  9/17-2.5mg  9/18-0.5mg 

## 2022-09-24 ENCOUNTER — Encounter (HOSPITAL_BASED_OUTPATIENT_CLINIC_OR_DEPARTMENT_OTHER): Payer: Self-pay

## 2022-09-24 ENCOUNTER — Ambulatory Visit (HOSPITAL_BASED_OUTPATIENT_CLINIC_OR_DEPARTMENT_OTHER): Payer: Medicare Other | Admitting: Family

## 2022-09-24 ENCOUNTER — Encounter (HOSPITAL_BASED_OUTPATIENT_CLINIC_OR_DEPARTMENT_OTHER): Payer: Self-pay | Admitting: Family

## 2022-09-24 VITALS — BP 124/62 | HR 71 | Ht 70.0 in | Wt 225.3 lb

## 2022-09-24 DIAGNOSIS — I5042 Chronic combined systolic (congestive) and diastolic (congestive) heart failure: Secondary | ICD-10-CM

## 2022-09-24 DIAGNOSIS — I480 Paroxysmal atrial fibrillation: Secondary | ICD-10-CM | POA: Diagnosis not present

## 2022-09-24 DIAGNOSIS — D6859 Other primary thrombophilia: Secondary | ICD-10-CM

## 2022-09-24 DIAGNOSIS — I25118 Atherosclerotic heart disease of native coronary artery with other forms of angina pectoris: Secondary | ICD-10-CM

## 2022-09-24 DIAGNOSIS — E119 Type 2 diabetes mellitus without complications: Secondary | ICD-10-CM

## 2022-09-24 DIAGNOSIS — I10 Essential (primary) hypertension: Secondary | ICD-10-CM

## 2022-09-24 DIAGNOSIS — G4733 Obstructive sleep apnea (adult) (pediatric): Secondary | ICD-10-CM | POA: Diagnosis not present

## 2022-09-24 DIAGNOSIS — E785 Hyperlipidemia, unspecified: Secondary | ICD-10-CM

## 2022-09-24 MED ORDER — ASPIRIN 81 MG PO TBEC
81.0000 mg | DELAYED_RELEASE_TABLET | Freq: Every day | ORAL | Status: DC
Start: 2022-09-24 — End: 2022-10-27

## 2022-09-24 NOTE — Progress Notes (Signed)
Cardiology Office Note:  .   Date:  09/24/2022  ID:  Gary [name], DOB [birth year], MRN [id number] PCP: Charlane Ferretti, DO  [facility] Providers Cardiologist:  Chilton Si, MD    History of Present Illness: .   Gary [name] is a 77 y.o. male with a hx of HTN, melanoma, DM2, GERD, CAD (2003 stent to distal RCA, 2004 stents to RCA and LAD, 2005 stent to prox mis and distal LAD, 2006 CABG, 2023 stent to SVG-RPDA), combined systolic and diastolic heart failure, HLD, PAF, OSA, Wegener's.   Admitted 09/25/21 with chest pain radiating to throat and jaw. Echo 09/26/21 with LVEF 50-55%, no RWMA, gr2DD, normal PASP, calcification of aortic and mitral valbes without regurgitation nor stenosis. Underwent LHC 9/25 23 with patent LIMA-LAD and SVG-ramus. There was 99% occlusion of SVG-RPDA which was treated with DES. Recommended of raspirin, clopidogrel, coumadin for 30 days then discontinue aspirin. Clopidogrel to be continued for 12 months then transitioned to aspirin.    He underwent scheduled cardioversion 02/26/22. Admitted 2/26-03/06/22 for aspiration pneumonia secondary to intubation/DCCV as he was on Ozempic which he did not stop but did not want to delay cardioversion. Losartan and HCTZ stopped due to hypotension.   He saw [facility] 03/22/22 and was in atrial fib. He wished to avoid Amiodarone due to Wegener's. Saw Dr. Elberta Fortis 04/12/22 recommended for Tikosyn. Cardioverted 05/13/22. Tikosyn admission cancelled as he was on Bactrim. He was admitted 06/27/22 for Tikosyn initiation and did not require DCCV.   Admitted 9/13-9/18/24 for acute hypoxic respiratory failure due to progressive ground glass bilateral opacities and ILD discharged on Prednisone taper.    Presents today for follow up. He is not feeling well since his last hospitalization still fatigued and dyspneic but stable since discharge. Is wearing 2 liters of supplemental oxygen. He is taking his medications as prescribed. BP  at home 120s/60s. He is tolerating Tikosyn well and notes no palpitations or feelings of being in atrial fibrillation. He has been taking lasix about once per week if his LLE is swollen. He has recently restarted Ozempic per his PCP. Reports no chest pain, pressure, or tightness. No orthopnea, PND. Reports no palpitations.   ROS: Please see the history of present illness.    All other systems reviewed and are negative.   Studies Reviewed: .        Cardiac Studies & Procedures   CARDIAC CATHETERIZATION  CARDIAC CATHETERIZATION 09/28/2021  Narrative Images from the original result were not included.    Prox LAD to Mid LAD lesion is 100% stenosed.   Ost LAD to Prox LAD lesion is 100% stenosed.   Ramus lesion is 100% stenosed.   Prox RCA lesion is 100% stenosed.   Prox RCA to Dist RCA lesion is 100% stenosed.   Ost Cx to Prox Cx lesion is 30% stenosed.   Dist Graft to Insertion lesion is 99% stenosed.   A drug-eluting stent was successfully placed using a SYNERGY XD 3.50X16.   Post intervention, there is a 0% residual stenosis.  Gary [name] is a 77 y.o. male   [id number] LOCATION:  FACILITY: [facility] PHYSICIAN: Nanetta Batty, M.D. March 31, 1945   DATE OF PROCEDURE:  09/28/2021  DATE OF DISCHARGE:     CARDIAC CATHETERIZATION / PCI DES PDA SVG    History obtained from chart review.77 y.o. male with a hx of hypertension, melanoma, CAD s/p PCI (stent to distal RCA 2003, stents to RCA and LAD in 2004, stent to  proximal mid and distal LAD 2005) and CABG (2006), mild ascending aortic aneurysm, chronic systolic and diastolic heart failure, hyperlipidemia, paroxysmal atrial fibrillation, and OSA (not compliant with CPAP due to sinus issues) who was admitted 09/25/2021 for atypical chest pain and elevated troponin.  His Coumadin was held.  He is placed on IV heparin.  He said no further chest pain.  He presents now for diagnostic coronary angiography.   PROCEDURE DESCRIPTION: The  patient was brought to the second floor [place] [facility] cath lab in the postabsorptive state. He was not premedicated. His left groin was prepped and shaved in usual sterile fashion. Xylocaine 1% was used for local anesthesia. A 5 French sheath was inserted into the left common femoral artery using standard Seldinger technique.  Ultrasound was used to identify the left common femoral artery and guide access.  A digital image was captured and placed the patient's chart.  5 French right left Judkins diagnostic catheters were used for selective coronary angiography, vein graft and IMA graft angiography and obtain left heart pressures.  Isovue dye was used for the entirety of the case (145 cc of contrast total to patient).  Retrograde aortic, ventricular and pullback pressures were recorded.  Patient received 600 mg of crushed p.o. clopidogrel along with Pepcid 20 mg IV.  Isovue dye was used for the entirety of the intervention.  Retroaortic pressures monitored in the case.  The patient received 11,000's of heparin with an ACT of 299.  Using a 6 [place] JR4 guide catheter along the 0.14 [place] guidewire a 2 mm x 12 mm balloon the distal PDA SVG lesion was crossed without difficulty.  The wire was then passed into the PDA.  Predilatation was performed with a 2 mm x 12 mm balloon resulting reduction of a 99% stenosis to approximately 50% residual.  Following this a 3.5 mm x 16 mm long Synergy drug-eluting stent was then carefully positioned and deployed across the diseased segment at 14 atm (3.7 mm) resulting in reduction of a 99% distal PDA SVG stenosis with TIMI I flow to 0% residual TIMI-3 flow.  The patient tolerated the procedure well.  There were no hemodynamic or electrocardiographic sequelae.  The guidewire and catheter were removed.  The sheath was secured in place.  Impression Successful PCI drug-eluting stenting of a high-grade distal PDA SVG stenosis.  He did have grade 2 left to right collaterals  and normal LV function.  The sheath will be removed removed once ACT falls below and 70 pressure held.  He will be hydrated overnight.  He will need "triple therapy" with aspirin, clopidogrel and Coumadin for 30 days after which aspirin can be discontinued.  Clopidogrel can be continued for 12 months.  After that clopidogrel can be discontinued and aspirin restarted.  He left the lab in stable condition.  Nanetta Batty. MD, [facility] 09/28/2021 8:59 AM  Findings Coronary Findings Diagnostic  Dominance: Right  Left Anterior Descending Ost LAD to Prox LAD lesion is 100% stenosed. Prox LAD to Mid LAD lesion is 100% stenosed. The lesion was previously treated .  Ramus Intermedius Ramus lesion is 100% stenosed.  Left Circumflex Ost Cx to Prox Cx lesion is 30% stenosed.  Right Coronary Artery Prox RCA lesion is 100% stenosed. Prox RCA to Dist RCA lesion is 100% stenosed. The lesion was previously treated .  Right Posterior Atrioventricular Artery Collaterals RPAV filled by collaterals from Dist LAD.  LIMA Graft To Dist LAD  Graft To Ramus  Graft To RPDA  Dist Graft to Insertion lesion is 99% stenosed. Vessel is the culprit lesion.  Intervention  Dist Graft to Insertion lesion (Graft To RPDA) Stent Lesion crossed with guidewire. Pre-stent angioplasty was performed. A drug-eluting stent was successfully placed using a SYNERGY XD 3.50X16. Stent strut is well apposed. Stent does not overlap previously placed stentPost-stent angioplasty was performed. Post-Intervention Lesion Assessment The intervention was successful. Pre-interventional TIMI flow is 1. Post-intervention TIMI flow is 3. No complications occurred at this lesion. There is a 0% residual stenosis post intervention.   STRESS TESTS  MYOCARDIAL PERFUSION IMAGING 11/27/2014  Narrative  There was no ST segment deviation noted during stress.  No T wave inversion was noted during stress.  Defect 1: There is a small defect  of moderate severity.  Small size, moderate-intensity fixed inferolateral wall defect - could represent artifact or scar. The study was non-gated, therefore LVEF was not calculated and wall motion abnormalities could not be determined. Based on the lack of significant reversible ischemia, this is a low risk study.   ECHOCARDIOGRAM  ECHOCARDIOGRAM COMPLETE 03/02/2022  Narrative ECHOCARDIOGRAM REPORT    Patient Name:   DONTRAIL SLIVINSKI [facility] Date of Exam: 03/02/2022 Medical Rec #:  [id number]         Height:       70.0 in Accession #:    [id number]        Weight:       234.1 lb Date of Birth:  [birth year]          BSA:          2.232 m Patient Age:    71 years          BP:           136/67 mmHg Patient Gender: M                 HR:           61 bpm. Exam Location:  [place]  Procedure: 2D Echo, 3D Echo, Cardiac Doppler, Color Doppler and Strain Analysis  Indications:    A-Fib  History:        Patient has prior history of Echocardiogram examinations, most recent 03/02/2022. CAD and Previous Myocardial Infarction; Risk Factors:Dyslipidemia, Diabetes, Sleep Apnea and Former Smoker.  Sonographer:    Dondra Prader RVT RCS Referring Phys: [id number] VONDRA BRIMAGE  IMPRESSIONS   1. Left ventricular ejection fraction, by estimation, is 50%. The left ventricle has mildly decreased function. The left ventricle demonstrates global hypokinesis. There is moderate concentric left ventricular hypertrophy. Left ventricular diastolic parameters are consistent with Grade II diastolic dysfunction (pseudonormalization). Elevated left atrial pressure. 2. Right ventricular systolic function is mildly reduced. The right ventricular size is normal. There is mildly elevated pulmonary artery systolic pressure. The estimated right ventricular systolic pressure is 35.5 mmHg. 3. Left atrial size was severely dilated. 4. The mitral valve is normal in structure. Trivial mitral valve regurgitation. No evidence of mitral  stenosis. Moderate mitral annular calcification. 5. The aortic valve is tricuspid. There is mild calcification of the aortic valve. There is mild thickening of the aortic valve. Aortic valve regurgitation is not visualized. Aortic valve sclerosis/calcification is present, without any evidence of aortic stenosis. 6. The inferior vena cava is normal in size with greater than 50% respiratory variability, suggesting right atrial pressure of 3 mmHg.  Comparison(s): No significant change from prior study. Prior images reviewed side by side.  FINDINGS Left Ventricle: Left ventricular ejection fraction, by estimation, is 50%. The  left ventricle has mildly decreased function. The left ventricle demonstrates global hypokinesis. 3D left ventricular ejection fraction analysis performed but not reported based on interpreter judgement due to suboptimal tracking. The left ventricular internal cavity size was normal in size. There is moderate concentric left ventricular hypertrophy. Left ventricular diastolic parameters are consistent with Grade II diastolic dysfunction (pseudonormalization). Elevated left atrial pressure.  Right Ventricle: The right ventricular size is normal. No increase in right ventricular wall thickness. Right ventricular systolic function is mildly reduced. There is mildly elevated pulmonary artery systolic pressure. The tricuspid regurgitant velocity is 2.85 m/s, and with an assumed right atrial pressure of 3 mmHg, the estimated right ventricular systolic pressure is 35.5 mmHg.  Left Atrium: Left atrial size was severely dilated.  Right Atrium: Right atrial size was normal in size.  Pericardium: There is no evidence of pericardial effusion.  Mitral Valve: The mitral valve is normal in structure. Moderate mitral annular calcification. Trivial mitral valve regurgitation. No evidence of mitral valve stenosis.  Tricuspid Valve: The tricuspid valve is normal in structure. Tricuspid valve  regurgitation is trivial.  Aortic Valve: The aortic valve is tricuspid. There is mild calcification of the aortic valve. There is mild thickening of the aortic valve. Aortic valve regurgitation is not visualized. Aortic valve sclerosis/calcification is present, without any evidence of aortic stenosis. Aortic valve mean gradient measures 7.3 mmHg. Aortic valve peak gradient measures 12.7 mmHg. Aortic valve area, by VTI measures 1.84 cm.  Pulmonic Valve: The pulmonic valve was grossly normal. Pulmonic valve regurgitation is not visualized.  Aorta: The aortic root and ascending aorta are structurally normal, with no evidence of dilitation.  Venous: The inferior vena cava is normal in size with greater than 50% respiratory variability, suggesting right atrial pressure of 3 mmHg.  IAS/Shunts: No atrial level shunt detected by color flow Doppler.   LEFT VENTRICLE PLAX 2D LVIDd:         5.20 cm      Diastology LVIDs:         4.50 cm      LV e' medial:    5.57 cm/s LV PW:         1.50 cm      LV E/e' medial:  18.3 LV IVS:        1.30 cm      LV e' lateral:   5.46 cm/s LVOT diam:     2.20 cm      LV E/e' lateral: 18.7 LV SV:         69 LV SV Index:   31           2D Longitudinal Strain LVOT Area:     3.80 cm     2D Strain GLS Avg:     -11.8 %  LV Volumes (MOD) LV vol d, MOD A2C: 91.0 ml  3D Volume EF: LV vol d, MOD A4C: 162.0 ml 3D EF:        54 % LV vol s, MOD A2C: 54.5 ml  LV EDV:       128 ml LV vol s, MOD A4C: 86.9 ml  LV ESV:       59 ml LV SV MOD A2C:     36.5 ml  LV SV:        69 ml LV SV MOD A4C:     162.0 ml LV SV MOD BP:      55.9 ml  RIGHT VENTRICLE  IVC RV Basal diam:  3.80 cm    IVC diam: 2.20 cm RV S prime:     9.51 cm/s TAPSE (M-mode): 1.5 cm  LEFT ATRIUM              Index        RIGHT ATRIUM           Index LA diam:        5.40 cm  2.42 cm/m   RA Area:     19.50 cm LA Vol (A2C):   96.2 ml  43.10 ml/m  RA Volume:   48.20 ml  21.60 ml/m LA Vol (A4C):    124.0 ml 55.56 ml/m LA Biplane Vol: 113.0 ml 50.63 ml/m AORTIC VALVE                     PULMONIC VALVE AV Area (Vmax):    1.99 cm      PV Vmax:       1.10 m/s AV Area (Vmean):   1.77 cm      PV Peak grad:  4.8 mmHg AV Area (VTI):     1.84 cm AV Vmax:           178.00 cm/s AV Vmean:          129.000 cm/s AV VTI:            0.374 m AV Peak Grad:      12.7 mmHg AV Mean Grad:      7.3 mmHg LVOT Vmax:         93.00 cm/s LVOT Vmean:        59.900 cm/s LVOT VTI:          0.181 m LVOT/AV VTI ratio: 0.49  AORTA Ao Root diam: 2.90 cm Ao Asc diam:  3.10 cm Ao Arch diam: 2.6 cm  MITRAL VALVE                TRICUSPID VALVE MV Area (PHT): 4.47 cm     TR Peak grad:   32.5 mmHg MV Decel Time: 170 msec     TR Vmax:        285.00 cm/s MV E velocity: 102.10 cm/s MV A velocity: 40.80 cm/s   SHUNTS MV E/A ratio:  2.50         Systemic VTI:  0.18 m Systemic Diam: 2.20 cm  Rachelle Hora Croitoru MD Electronically signed by Thurmon Fair MD Signature Date/Time: 03/02/2022/3:17:58 PM    Final             Risk Assessment/Calculations:    CHA2DS2-VASc Score = 6   This indicates a 9.7% annual risk of stroke. The patient's score is based upon: CHF History: 1 HTN History: 1 Diabetes History: 1 Stroke History: 0 Vascular Disease History: 1 Age Score: 2 Gender Score: 0        STOP-Bang Score:         Physical Exam:   VS:  BP 124/62 (BP Location: Left Arm, Patient Position: Sitting, Cuff Size: Large)   Pulse 71   Ht 5\' 10"  (1.778 m)   Wt 225 lb 4.8 oz (102.2 kg)   SpO2 95%   BMI 32.33 kg/m    Wt Readings from Last 3 Encounters:  09/24/22 225 lb 4.8 oz (102.2 kg)  09/22/22 229 lb (103.9 kg)  07/14/22 227 lb 6.4 oz (103.1 kg)    GEN: Well nourished, well developed in no acute distress NECK: No JVD; No carotid bruits  CARDIAC: RRR, no murmurs, rubs, gallops RESPIRATORY:  Clear/diminished to auscultation without rales, wheezing or rhonchi  ABDOMEN: Soft, non-tender,  non-distended EXTREMITIES:  No edema; No deformity   ASSESSMENT AND PLAN: .    CAD s/p CABG and PCI- 09/28/21 DES to SVG-PDA. Stable with no anginal symptoms. No indication for ischemic evaluation. GDMT Coreg, Plavix, Repatha. Given he is 1 year from DES will stop Plavix and start Aspirin EC 81mg  daily on 09/30/22.    HLD, LDL goal <70 - 08/2021 LDL 75. He is following up with PCP in three months and labs will be checked then. Continue Repatha.   PAF / Hypercoagulable state - He is is normal sinus rhythm today by auscultation. He denies hematuria and melena. He will continue taking Tikosyn, Plavix, and Coumadin. CHA2DS2-VASc Score = 6 [CHF History: 1, HTN History: 1, Diabetes History: 1, Stroke History: 0, Vascular Disease History: 1, Age Score: 2, Gender Score: 0].  Therefore, the patient's annual risk of stroke is 9.7 %.       Combined systolic and diastolic heart failure with recovered LVEF - 02/2022 LVEF 50%. He appears euvolemic on exam. Low sodium diet, fluid restriction <2L, and daily weights encouraged. Educated to contact our office for weight gain of 2 lbs overnight or 5 lbs in one week. GDMT Lasix PRN, Carvedilol. Did not tolerate SGLT2i.   HTN - His blood pressure is well controlled. He will continue taking amlodipine and carvedilol. Discussed to monitor BP at home at least 2 hours after medications and sitting for 5-10 minutes. Recommend aiming for 150 minutes of moderate intensity activity per week and following a heart healthy diet.    OSA - CPAP compliance encouraged.    DM2 - His A1C was 6.5 on 09/18/22. He will continue taking metformin.         Dispo: He will follow up in the clinic in six months.   Signed, Alver Sorrow, NP

## 2022-09-24 NOTE — Patient Instructions (Addendum)
Medication Instructions:   On 09/30/22, stop Plavix and start Aspirin EC 81mg  daily.  *If you need a refill on your cardiac medications before your next appointment, please call your pharmacy*   Lab Work: None Ordered    Testing/Procedures: None Ordered   Follow-Up: At [facility], you and your health needs are our priority.  As part of our continuing mission to provide you with exceptional heart care, we have created designated Provider Care Teams.  These Care Teams include your primary Cardiologist (physician) and Advanced Practice Providers (APPs -  Physician Assistants and Nurse Practitioners) who all work together to provide you with the care you need, when you need it.  We recommend signing up for the patient portal called "MyChart".  Sign up information is provided on this After Visit Summary.  MyChart is used to connect with patients for Virtual Visits (Telemedicine).  Patients are able to view lab/test results, encounter notes, upcoming appointments, etc.  Non-urgent messages can be sent to your provider as well.   To learn more about what you can do with MyChart, go to [email or web address].    Your next appointment:   6 month(s)  Provider:   Chilton Si, MD or Gillian Shields, NP    Other Instructions

## 2022-09-27 ENCOUNTER — Ambulatory Visit: Payer: Medicare Other | Attending: Cardiovascular Disease

## 2022-09-27 DIAGNOSIS — I480 Paroxysmal atrial fibrillation: Secondary | ICD-10-CM

## 2022-09-27 DIAGNOSIS — Z7901 Long term (current) use of anticoagulants: Secondary | ICD-10-CM

## 2022-09-27 LAB — POCT INR: INR: 1.1 — AB (ref 2.0–3.0)

## 2022-09-27 NOTE — Patient Instructions (Addendum)
Description   Take an extra 1/2 tablet today and take 1.5 tablets tomorrow and then continue taking warfarin 1 tablet daily, except 1/2 tablet Monday, Wednesday and Friday.  Recheck INR on Friday.  Please call us at [phone] with any questions.

## 2022-09-28 ENCOUNTER — Ambulatory Visit: Payer: Self-pay

## 2022-09-28 DIAGNOSIS — D849 Immunodeficiency, unspecified: Secondary | ICD-10-CM | POA: Diagnosis not present

## 2022-09-28 DIAGNOSIS — I5022 Chronic systolic (congestive) heart failure: Secondary | ICD-10-CM | POA: Diagnosis not present

## 2022-09-28 DIAGNOSIS — Z7952 Long term (current) use of systemic steroids: Secondary | ICD-10-CM | POA: Diagnosis not present

## 2022-09-28 DIAGNOSIS — R351 Nocturia: Secondary | ICD-10-CM | POA: Diagnosis not present

## 2022-09-28 DIAGNOSIS — D6869 Other thrombophilia: Secondary | ICD-10-CM | POA: Diagnosis not present

## 2022-09-28 DIAGNOSIS — E1169 Type 2 diabetes mellitus with other specified complication: Secondary | ICD-10-CM | POA: Diagnosis not present

## 2022-09-28 DIAGNOSIS — F325 Major depressive disorder, single episode, in full remission: Secondary | ICD-10-CM | POA: Diagnosis not present

## 2022-09-28 DIAGNOSIS — J9601 Acute respiratory failure with hypoxia: Secondary | ICD-10-CM | POA: Diagnosis not present

## 2022-09-28 DIAGNOSIS — M3130 Wegener's granulomatosis without renal involvement: Secondary | ICD-10-CM | POA: Diagnosis not present

## 2022-09-28 DIAGNOSIS — J9611 Chronic respiratory failure with hypoxia: Secondary | ICD-10-CM | POA: Diagnosis not present

## 2022-09-28 DIAGNOSIS — I4891 Unspecified atrial fibrillation: Secondary | ICD-10-CM | POA: Diagnosis not present

## 2022-09-28 DIAGNOSIS — J849 Interstitial pulmonary disease, unspecified: Secondary | ICD-10-CM | POA: Diagnosis not present

## 2022-09-28 NOTE — Patient Outreach (Signed)
Care Coordination   09/28/2022 Name: Gary [name] MRN: [id number] DOB: [birth year]   Care Coordination Outreach Attempts:  An unsuccessful telephone outreach was attempted for a scheduled appointment today.  Follow Up Plan:  Additional outreach attempts will be made to offer the patient care coordination information and services.   Encounter Outcome:  Patient Request to Call Back   Care Coordination Interventions:  No, not indicated    Delsa Sale RN BSN CCM [place]  [facility], [facility] Nurse Care Coordinator  Direct Dial: [phone] Website: [email or web address]

## 2022-09-30 DIAGNOSIS — D509 Iron deficiency anemia, unspecified: Secondary | ICD-10-CM | POA: Diagnosis not present

## 2022-09-30 DIAGNOSIS — I10 Essential (primary) hypertension: Secondary | ICD-10-CM | POA: Diagnosis not present

## 2022-09-30 DIAGNOSIS — E291 Testicular hypofunction: Secondary | ICD-10-CM | POA: Diagnosis not present

## 2022-10-01 ENCOUNTER — Ambulatory Visit: Payer: Medicare Other | Attending: Internal Medicine | Admitting: *Deleted

## 2022-10-01 ENCOUNTER — Other Ambulatory Visit: Payer: Medicare Other

## 2022-10-01 DIAGNOSIS — Z7901 Long term (current) use of anticoagulants: Secondary | ICD-10-CM

## 2022-10-01 DIAGNOSIS — I480 Paroxysmal atrial fibrillation: Secondary | ICD-10-CM | POA: Diagnosis not present

## 2022-10-01 LAB — POCT INR: POC INR: 2.4

## 2022-10-01 NOTE — Patient Instructions (Addendum)
Description   Continue taking warfarin 1 tablet daily, except 1/2 tablet Monday, Wednesday and Friday. However, On Saturday 10/02/2022 and Sunday 10/03/2022 take 1/2 a tablet of warfarin.  Recheck INR on 10/06/2022.  Please call us at [phone] with any questions.

## 2022-10-05 ENCOUNTER — Ambulatory Visit: Payer: Self-pay

## 2022-10-05 NOTE — Patient Outreach (Signed)
Care Coordination   10/05/2022 Name: Gary [name] MRN: [id number] DOB: 02/04/1945   Care Coordination Outreach Attempts:  An unsuccessful telephone outreach was attempted for a scheduled appointment today.  Follow Up Plan:  Additional outreach attempts will be made to offer the patient care coordination information and services.   Encounter Outcome:  Patient Request to Call Back   Care Coordination Interventions:  No, not indicated    Delsa Sale RN BSN CCM [place]  [facility], [facility] Nurse Care Coordinator  Direct Dial: [phone] Website: [email or web address]

## 2022-10-06 ENCOUNTER — Ambulatory Visit: Payer: Medicare Other | Attending: Cardiology

## 2022-10-06 DIAGNOSIS — Z7901 Long term (current) use of anticoagulants: Secondary | ICD-10-CM | POA: Diagnosis not present

## 2022-10-06 DIAGNOSIS — I480 Paroxysmal atrial fibrillation: Secondary | ICD-10-CM | POA: Diagnosis not present

## 2022-10-06 LAB — POCT INR: INR: 2.6 (ref 2.0–3.0)

## 2022-10-06 NOTE — Patient Instructions (Signed)
Description   Continue taking warfarin 1 tablet daily, except 1/2 tablet Monday, Wednesday and Friday. However, On Sunday 10/10/2022 take 1/2 a tablet of warfarin.  Recheck INR in 1 week.  Please call us at [phone] with any questions.

## 2022-10-13 ENCOUNTER — Ambulatory Visit: Payer: Medicare Other | Attending: Cardiovascular Disease | Admitting: *Deleted

## 2022-10-13 DIAGNOSIS — I480 Paroxysmal atrial fibrillation: Secondary | ICD-10-CM | POA: Insufficient documentation

## 2022-10-13 DIAGNOSIS — Z7901 Long term (current) use of anticoagulants: Secondary | ICD-10-CM | POA: Diagnosis not present

## 2022-10-13 LAB — POCT INR: INR: 6.4 — AB (ref 2.0–3.0)

## 2022-10-13 LAB — PROTIME-INR
INR: 5.1 (ref 0.9–1.2)
Prothrombin Time: 51.2 s — ABNORMAL HIGH (ref 9.1–12.0)

## 2022-10-13 NOTE — Patient Instructions (Addendum)
Description   Spoke with pt and advised not to take any warfarin tomorrow (already taken today's dose), no warfarin Friday, 1/2 tablet of warfarin on Saturday, then start taking 1/2 tablet daily, except 1 tablet on Sunday, Tuesday, and Thursday. Recheck INR in 1 week.  Currently: Prednisone 40mg  x 14 days then 30mg  daily with breakfast for 14 days,then 20mg  daily with breakfast for 14 days. Report to ER with any bleeding, falls, accidents.   Please call us at [phone] with any questions.

## 2022-10-18 ENCOUNTER — Inpatient Hospital Stay (HOSPITAL_COMMUNITY): Admission: RE | Admit: 2022-10-18 | Payer: Medicare Other | Source: Ambulatory Visit | Admitting: Internal Medicine

## 2022-10-20 ENCOUNTER — Ambulatory Visit: Payer: Medicare Other | Attending: Cardiovascular Disease

## 2022-10-20 DIAGNOSIS — I480 Paroxysmal atrial fibrillation: Secondary | ICD-10-CM | POA: Insufficient documentation

## 2022-10-20 DIAGNOSIS — Z7901 Long term (current) use of anticoagulants: Secondary | ICD-10-CM | POA: Insufficient documentation

## 2022-10-20 LAB — POCT INR: INR: 2.2 (ref 2.0–3.0)

## 2022-10-20 NOTE — Patient Instructions (Signed)
Description   Take 1/2 tablet daily except 1 tablet on Tuesdays.  Recheck INR in 1 week.  Currently: Prednisone 30mg  daily with breakfast for 14 days,then 20mg  daily with breakfast for 14 days. Normal dose: 1/2 tablet daily, except 1 tablet on Sunday, Tuesday, and Thursday. Recheck INR in 1 week.  Report to ER with any bleeding, falls, accidents.  Please call us at [phone] with any questions.

## 2022-10-25 ENCOUNTER — Ambulatory Visit: Payer: Self-pay

## 2022-10-25 NOTE — Patient Instructions (Signed)
Visit Information  Thank you for taking time to visit with me today. Please don't hesitate to contact me if I can be of assistance to you.   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 scheduled for 11/04/22 @2 :00 PM     To have evaluation for treatment of A-fib   On track    Care Coordination Interventions: Evaluation of current treatment plan related to persistent Atrial Fib  and patient's adherence to plan as established by provider Reviewed and discussed with patient he remains to be on a Tikosyn regimen for management of his A-fib with good results as he remains to be in normal sinus rhythm  Discussed patient is concerned he may experiencing a SE to this medication that includes severe abdominal cramping  Reviewed the manufacturer listed potential SE with patient and noted abdominal pain is listed as a potential SE  Educated patient about [facility] pharmacy team for consideration of pharmacy referral for medication review, patient may consider and let this RN know if he needs a referral  Instructed patient to keep doctor informed of persistent, new or worsening symptoms and patient verbalizes understanding  Reviewed next upcoming scheduled follow up with [name] PA for Afib follow up set for 10/27/22 @09 :30 AM        Our next appointment is by telephone on 11/22/22  at 12:30 PM  Please call the care guide team at [phone] if you need to cancel or reschedule your appointment.   If you are experiencing a Mental Health or Behavioral Health Crisis or need someone to talk to, please call 1-800-273-TALK (toll free, 24 hour hotline)  Patient verbalizes understanding of instructions and care plan provided today and agrees to view in MyChart. Active MyChart status and patient understanding of how to access instructions and care plan via MyChart confirmed with patient.     Delsa Sale RN BSN CCM [place]  [facility] Nurse Care Coordinator  Direct Dial: [phone] Website: [email or web address]

## 2022-10-25 NOTE — Patient Outreach (Signed)
Care Coordination   Follow Up Visit Note   10/25/2022 Name: Gary [name] MRN: [id number] DOB: [birth year]  Gary [name] is a 77 y.o. year old male who sees Gary [name], [place] for primary care. I spoke with  Gary [name] by phone today.  What matters to the patients health and wellness today?  Patient would like to have his lung status further evaluated. He will report symptoms of abdominal pain to his provider during an upcoming A-fib f/u.     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 Review of patient status, including review of consultant's reports, relevant laboratory and other test results, and medications completed Reviewed and discussed with patient his upcoming scheduled Pulmonology consultation with with Gary [name] scheduled for 11/04/22 @2 :00 PM     To have evaluation for treatment of A-fib   On track    Care Coordination Interventions: Evaluation of current treatment plan related to persistent Atrial Fib  and patient's adherence to plan as established by provider Reviewed and discussed with patient he remains to be on a Tikosyn regimen for management of his A-fib with good results as he remains to be in normal sinus rhythm  Discussed patient is concerned he may experiencing a SE to this medication that includes severe abdominal cramping  Reviewed the manufacturer listed potential SE with patient and noted abdominal pain is listed as a potential SE  Educated patient about [facility] pharmacy team for consideration of pharmacy referral for medication review, patient may consider and let this RN know if he needs a referral  Instructed  patient to keep doctor informed of persistent, new or worsening symptoms and patient verbalizes understanding  Reviewed next upcoming scheduled follow up with Gary Bells PA for Afib follow up set for 10/27/22 @09 :30 AM    Interventions Today    Flowsheet Row Most Recent Value  Chronic Disease   Chronic disease during today's visit Other  [ILD]  General Interventions   General Interventions Discussed/Reviewed General Interventions Discussed, General Interventions Reviewed, Doctor Visits, Durable Medical Equipment (DME), Labs  Doctor Visits Discussed/Reviewed Doctor Visits Discussed, Doctor Visits Reviewed, Specialist, PCP  Durable Medical Equipment (DME) Oxygen  Exercise Interventions   Exercise Discussed/Reviewed Exercise Reviewed, Physical Activity, Exercise Discussed  Physical Activity Discussed/Reviewed Physical Activity Discussed, Physical Activity Reviewed  Education Interventions   Education Provided Provided Education  Provided Verbal Education On Medication, When to see the doctor, Labs, Blood Sugar Monitoring  Pharmacy Interventions   Pharmacy Dicussed/Reviewed Pharmacy Topics Reviewed, Pharmacy Topics Discussed, Medications and their functions          SDOH assessments and interventions completed:  No     Care Coordination Interventions:  Yes, provided   Follow up plan: Follow up call scheduled for 11/22/22 @12 :30 PM     Encounter Outcome:  Patient Visit Completed

## 2022-10-27 ENCOUNTER — Ambulatory Visit: Payer: Medicare Other

## 2022-10-27 ENCOUNTER — Ambulatory Visit
Admission: RE | Admit: 2022-10-27 | Discharge: 2022-10-27 | Disposition: A | Discharge status: Home / Self Care | Payer: Medicare Other | Source: Ambulatory Visit | Attending: Internal Medicine | Admitting: Internal Medicine

## 2022-10-27 VITALS — BP 134/68 | HR 59 | Ht 70.0 in | Wt 223.2 lb

## 2022-10-27 DIAGNOSIS — I5042 Chronic combined systolic (congestive) and diastolic (congestive) heart failure: Secondary | ICD-10-CM | POA: Insufficient documentation

## 2022-10-27 DIAGNOSIS — I480 Paroxysmal atrial fibrillation: Secondary | ICD-10-CM

## 2022-10-27 DIAGNOSIS — D6869 Other thrombophilia: Secondary | ICD-10-CM | POA: Insufficient documentation

## 2022-10-27 DIAGNOSIS — I110 Hypertensive heart disease with heart failure: Secondary | ICD-10-CM | POA: Diagnosis not present

## 2022-10-27 DIAGNOSIS — Z7952 Long term (current) use of systemic steroids: Secondary | ICD-10-CM | POA: Insufficient documentation

## 2022-10-27 DIAGNOSIS — Z79899 Other long term (current) drug therapy: Secondary | ICD-10-CM | POA: Diagnosis not present

## 2022-10-27 DIAGNOSIS — I4891 Unspecified atrial fibrillation: Secondary | ICD-10-CM

## 2022-10-27 DIAGNOSIS — E785 Hyperlipidemia, unspecified: Secondary | ICD-10-CM | POA: Insufficient documentation

## 2022-10-27 DIAGNOSIS — Z7901 Long term (current) use of anticoagulants: Secondary | ICD-10-CM

## 2022-10-27 DIAGNOSIS — I7121 Aneurysm of the ascending aorta, without rupture: Secondary | ICD-10-CM | POA: Insufficient documentation

## 2022-10-27 DIAGNOSIS — M3130 Wegener's granulomatosis without renal involvement: Secondary | ICD-10-CM | POA: Insufficient documentation

## 2022-10-27 DIAGNOSIS — Z955 Presence of coronary angioplasty implant and graft: Secondary | ICD-10-CM | POA: Insufficient documentation

## 2022-10-27 DIAGNOSIS — I2510 Atherosclerotic heart disease of native coronary artery without angina pectoris: Secondary | ICD-10-CM | POA: Insufficient documentation

## 2022-10-27 DIAGNOSIS — G4733 Obstructive sleep apnea (adult) (pediatric): Secondary | ICD-10-CM | POA: Diagnosis not present

## 2022-10-27 DIAGNOSIS — Z5181 Encounter for therapeutic drug level monitoring: Secondary | ICD-10-CM | POA: Diagnosis not present

## 2022-10-27 DIAGNOSIS — Z951 Presence of aortocoronary bypass graft: Secondary | ICD-10-CM | POA: Insufficient documentation

## 2022-10-27 DIAGNOSIS — I4819 Other persistent atrial fibrillation: Secondary | ICD-10-CM

## 2022-10-27 DIAGNOSIS — R001 Bradycardia, unspecified: Secondary | ICD-10-CM | POA: Diagnosis not present

## 2022-10-27 LAB — BASIC METABOLIC PANEL
Anion gap: 9 (ref 5–15)
BUN: 22 mg/dL (ref 8–23)
CO2: 26 mmol/L (ref 22–32)
Calcium: 9.2 mg/dL (ref 8.9–10.3)
Chloride: 106 mmol/L (ref 98–111)
Creatinine, Ser: 1.1 mg/dL (ref 0.61–1.24)
GFR, Estimated: >60 mL/min (ref >60)
Glucose, Bld: 122 mg/dL — ABNORMAL HIGH (ref 70–99)
Potassium: 4.7 mmol/L (ref 3.5–5.1)
Sodium: 141 mmol/L (ref 135–145)

## 2022-10-27 LAB — POCT INR: INR: 1.5 — AB (ref 2.0–3.0)

## 2022-10-27 LAB — MAGNESIUM: Magnesium: 2.2 mg/dL (ref 1.7–2.4)

## 2022-10-27 NOTE — Progress Notes (Signed)
Primary Care Physician: Charlane Ferretti, DO Primary Cardiologist: Dr. Duke Salvia Primary Electrophysiologist: N/A Referring Physician: Dr. Alinda Deem Gary [name] is a 77 y.o. male with a history of hypertension, melanoma, CAD s/p PCI and CABG, mild ascending aortic aneurysm, chronic systolic and diastolic heart failure, hyperlipidemia, paroxysmal atrial fibrillation, and OSA non-compliant with CPAP who presents for consultation in the [facility]. Patient is on coumadin for a CHADS2VASC score of 6.  He underwent scheduled cardioversion on 02/26/22, for afib that was found in January. CV was successful in converting to sinus rhythm on the second shock .Unfortunately, he was admitted from 2/26-3/2 for aspiration pneumonia possibly secondary to  intubation/ DCCV as pt was on Ozempic, which was not stopped and he did not want to delay CV. He appeared to have been in sinus rhythm during admission. He is currently on carvedilol and warfarin. No bleeding concerns. He does state was on eliquis a couple years ago but switched to coumadin due to cost while in the donut hole. He is in rate controlled afib today and was not aware.   He cannot specifically tell me when he went back into Afib since hospital discharge. He admits to overall feeling low energy but it is not new and has felt this way for a while. He definitely feels better than he did 2 weeks ago. Has had Afib for about 4 years intermittently; required cardioversion about a year or so ago. Denies shortness of breath or palpitations. He is now compliant with his CPAP.   On follow up 05/11/22, he is currently in rate controlled Afib. He is here today for Tikosyn admission after plan confirmed at OV with Dr. Elberta Fortis on 04/12/22. Medication list reviewed by pharmacy and no contraindications noted. He has completed 4 therapeutic INR checks being on coumadin. He has not taken benadryl in the last week. He does note some bilateral  ankle swelling since stopping aldactone.  On follow up 05/24/22, he is currently in NSR. S/p successful DCCV on 5/9 to NSR. He was originally scheduled for Tikosyn admission but this was canceled unfortunately due to being on bactrim. This medication was added to his list after pharmacy review. Seen by rheumatologist Dr. Dierdre Forth on 5/15 and bactrim was discontinued; placed on prednisone 5 mg daily. He has continued INR checks as directed and none have been below 2.0; most recent INR on 5/17 was 3.4.   On follow up 06/07/22, he is currently in NSR. He is here today for Tikosyn admission. He is currently on prednisone daily instead of bactrim. He has completed 4 therapeutic INR checks. He has not begun any new medications since last office visit. He has taken all his morning medications as directed.  On follow up 06/17/22, he is s/p Tikosyn admission 6/3-6/24. He did not require DCCV during stay. He was discharged on 500 mcg BID dosage. He is doing well since discharge and has not had any episodes of Afib. He has remained compliant on Tikosyn 500 mcg BID dosing. Potassium supplementation increased. He has noted excess flatulence since discharge.   On follow up 07/14/22, he is currently in NSR. Patient is here for 1 month Tikosyn surveillance. He has been doing well since last OV and reports no episodes of Afib. No missed doses of Tikosyn 500 mcg. He is compliant with INR checks; recent INR 2.5 on 06/29/22.   On follow up 10/27/22, he is currently in NSR. He has had no episodes of Afib since  last OV. No missed doses of Tikosyn 500 mcg BID. He is compliant with INR checks and managed by coumadin clinic. Unfortunately, patient tells me his Wegener's is affecting his lungs and he is on high dose prednisone currently.   Today, he denies symptoms of palpitations, chest pain, shortness of breath, orthopnea, PND, lower extremity edema, dizziness, presyncope, syncope, snoring, daytime somnolence, bleeding, or neurologic  sequela. The patient is tolerating medications without difficulties and is otherwise without complaint today.    Atrial Fibrillation Risk Factors:  he does have symptoms or diagnosis of sleep apnea. he is not compliant with CPAP therapy. he does not have a history of rheumatic fever. he does not have a history of alcohol use. The patient does not have a history of early familial atrial fibrillation or other arrhythmias.  he has a BMI of Body mass index is 32.03 kg/m.Marland Kitchen Filed Weights   10/27/22 0920  Weight: 101.2 kg     Atrial Fibrillation Management history:  Previous antiarrhythmic drugs: Tikosyn 500 mcg BID Previous cardioversions: 02/26/22 Previous ablations: None CHADS2VASC score: 6 Anticoagulation history: Coumadin   Past Medical History:  Diagnosis Date   Aortic aneurysm (HCC) 02/20/2016   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   Arthritis    Atrial fibrillation (HCC) 11/13/2014   Cancer (HCC)    skin   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/2016   LVEF 45-50% 11/2014   Coronary artery disease    Depression    Essential hypertension 11/13/2014   Hyperlipidemia    Hyperlipidemia 11/13/2014   Hypertension    Sleep apnea    CPAP 'Uses half the time"  last study 2009   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   Past Surgical History:  Procedure Laterality Date   CARDIOVERSION N/A 12/06/2014   Procedure: CARDIOVERSION;  Surgeon: Chilton Si, MD;  Location: [facility] ENDOSCOPY;  Service: Cardiovascular;  Laterality: N/A;   CARDIOVERSION N/A 05/26/2017   Procedure: CARDIOVERSION;  Surgeon: Chilton Si, MD;  Location: [facility] ENDOSCOPY;  Service: Cardiovascular;  Laterality: N/A;   CARDIOVERSION N/A 04/25/2019   Procedure: CARDIOVERSION;  Surgeon: Thurmon Fair, MD;  Location: [facility] ENDOSCOPY;  Service: Cardiovascular;  Laterality: N/A;   CARDIOVERSION N/A 02/26/2022   Procedure: CARDIOVERSION;  Surgeon: Chilton Si, MD;  Location: [facility] ENDOSCOPY;   Service: Cardiovascular;  Laterality: N/A;   CARDIOVERSION N/A 05/13/2022   Procedure: CARDIOVERSION;  Surgeon: Quintella Reichert, MD;  Location: [facility] INVASIVE CV LAB;  Service: Cardiovascular;  Laterality: N/A;   CARPAL TUNNEL RELEASE     CORONARY ANGIOPLASTY     multiple stents   CORONARY ARTERY BYPASS GRAFT  2006   CORONARY STENT INTERVENTION N/A 09/28/2021   Procedure: CORONARY STENT INTERVENTION;  Surgeon: Runell Gess, MD;  Location: [facility] INVASIVE CV LAB;  Service: Cardiovascular;  Laterality: N/A;  SVG-PDA   EYE SURGERY     right eye-  muscular repair   LEFT HEART CATH AND CORS/GRAFTS ANGIOGRAPHY N/A 09/28/2021   Procedure: LEFT HEART CATH AND CORS/GRAFTS ANGIOGRAPHY;  Surgeon: Runell Gess, MD;  Location: [facility] INVASIVE CV LAB;  Service: Cardiovascular;  Laterality: N/A;   TOTAL HIP ARTHROPLASTY  01/03/2012   Procedure: TOTAL HIP ARTHROPLASTY;  Surgeon: Jacki Cones, MD;  Location: [facility] ORS;  Service: Orthopedics;  Laterality: Right;    Current Outpatient Medications  Medication Sig Dispense Refill   ALPRAZolam (XANAX) 0.5 MG tablet Take 0.5 mg by mouth at bedtime. Takes at 11:00pm     amLODipine (NORVASC) 2.5 MG tablet Take 2.5  mg by mouth daily. Takes at 6:30am     carvedilol (COREG) 25 MG tablet Take 0.5 tablets (12.5 mg total) by mouth 2 (two) times daily.     CHELATED ZINC PO Take 1 tablet by mouth daily.     Cholecalciferol (VITAMIN D-3 PO) Take 1 capsule by mouth daily.     Coenzyme Q10 (COQ10 PO) Take 1 capsule by mouth daily.     dofetilide (TIKOSYN) 500 MCG capsule Take 1 capsule (500 mcg total) by mouth 2 (two) times daily. 60 capsule 3   Evolocumab (REPATHA SURECLICK) 140 MG/ML SOAJ INJECT 140 MG INTO THE SKIN EVERY 14 (FOURTEEN) DAYS. INJECT 1 PEN INTO THE FATTY TISSUE SKIN EVERY 14 DAY 6 mL 3   famotidine (PEPCID) 20 MG tablet Take 1 tablet (20 mg total) by mouth 2 (two) times daily.     furosemide (LASIX) 40 MG tablet Take 1 tablet (40 mg total) by mouth daily as  needed. 30 tablet 3   KLOR-CON M20 20 MEQ tablet TAKE 2 TABLETS BY MOUTH DAILY (Patient taking differently: Take 20 mEq by mouth daily.) 180 tablet 1   levothyroxine (SYNTHROID, LEVOTHROID) 137 MCG tablet Take 137 mcg by mouth daily before breakfast.     loratadine (CLARITIN) 10 MG tablet Take 10 mg by mouth daily.     metFORMIN (GLUCOPHAGE) 500 MG tablet Take 500 mg by mouth 2 (two) times daily.  12   Multiple Vitamins-Minerals (CENTRUM SILVER 50+MEN) TABS Take 1 tablet by mouth daily.     nitroGLYCERIN (NITROSTAT) 0.4 MG SL tablet Place 1 tablet (0.4 mg total) under the tongue every 5 (five) minutes x 3 doses as needed for chest pain. 25 tablet 12   pantoprazole (PROTONIX) 40 MG tablet Take 40 mg by mouth daily.     predniSONE (DELTASONE) 20 MG tablet Take 3 tablets by mouth daily with breakfast for 13 days, THEN 2 tablets daily with breakfast for 14 days, THEN 1.5 tablets daily with breakfast for 14 days, THEN 1 tablet daily with breakfast for 14 days. 102 tablet 0   riTUXimab (RITUXAN IV) Inject 1 application  into the vein See admin instructions. Take every two weeks and every six months alternating     Semaglutide (OZEMPIC, 0.25 OR 0.5 MG/DOSE, Marie) Inject 0.5 mg into the skin once a week.     testosterone cypionate (DEPOTESTOSTERONE CYPIONATE) 200 MG/ML injection Inject 150 mg into the muscle every 14 (fourteen) days.     venlafaxine XR (EFFEXOR-XR) 150 MG 24 hr capsule Take 150 mg by mouth daily.     warfarin (COUMADIN) 5 MG tablet TAKE 1/2 TABLET TO 1 TABLET BY MOUTH DAILY AS DIRECTED BY COUMADIN CLINIC (Patient taking differently: Take 2.5-5 mg by mouth See admin instructions. Take 2.5 mg once daily on Monday, Wednesday, Friday. Take 5 mg all other days.) 90 tablet 0   ferrous sulfate 325 (65 FE) MG EC tablet Take 325 mg by mouth every Monday, Wednesday, and Friday. (Patient not taking: Reported on 10/27/2022)     No current facility-administered medications for this encounter.     Allergies  Allergen Reactions   Farxiga [Dapagliflozin] Other (See Comments)    Lightheadedness Low blood pressure   Statins Other (See Comments)    Myalgias     ROS- All systems are reviewed and negative except as per the HPI above.  Physical Exam: Vitals:   10/27/22 0920  BP: 134/68  Pulse: (!) 59  Weight: 101.2 kg  Height: 5\' 10"  (1.778  m)     GEN- The patient is well appearing, alert and oriented x 3 today.   Neck - no JVD or carotid bruit noted Lungs- Clear to ausculation bilaterally, normal work of breathing Heart- Regular rate and rhythm, no murmurs, rubs or gallops, PMI not laterally displaced Extremities- no clubbing, cyanosis, or edema Skin - no rash or ecchymosis noted   Wt Readings from Last 3 Encounters:  10/27/22 101.2 kg  09/24/22 102.2 kg  09/22/22 103.9 kg    ECG today demonstrates  Vent. rate 59 BPM PR interval 154 ms QRS duration 110 ms QT/QTcB 460/455 ms P-R-T axes 64 -36 -6 Sinus bradycardia Left axis deviation Minimal voltage criteria for LVH, may be normal variant ( Cornell product ) Nonspecific ST and T wave abnormality Abnormal ECG When compared with ECG of 18-Sep-2022 01:28, PREVIOUS ECG IS PRESENT  Echo 03/02/22 demonstrated    1. Left ventricular ejection fraction, by estimation, is 50%. The left  ventricle has mildly decreased function. The left ventricle demonstrates  global hypokinesis. There is moderate concentric left ventricular  hypertrophy. Left ventricular diastolic  parameters are consistent with Grade II diastolic dysfunction  (pseudonormalization). Elevated left atrial pressure.   2. Right ventricular systolic function is mildly reduced. The right  ventricular size is normal. There is mildly elevated pulmonary artery  systolic pressure. The estimated right ventricular systolic pressure is  35.5 mmHg.   3. Left atrial size was severely dilated.   4. The mitral valve is normal in structure. Trivial mitral valve   regurgitation. No evidence of mitral stenosis. Moderate mitral annular  calcification.   5. The aortic valve is tricuspid. There is mild calcification of the  aortic valve. There is mild thickening of the aortic valve. Aortic valve  regurgitation is not visualized. Aortic valve sclerosis/calcification is  present, without any evidence of  aortic stenosis.   6. The inferior vena cava is normal in size with greater than 50%  respiratory variability, suggesting right atrial pressure of 3 mmHg.   Comparison(s): No significant change from prior study. Prior images  reviewed side by side.   Heart cath 09/28/21:    Prox LAD to Mid LAD lesion is 100% stenosed.   Ost LAD to Prox LAD lesion is 100% stenosed.   Ramus lesion is 100% stenosed.   Prox RCA lesion is 100% stenosed.   Prox RCA to Dist RCA lesion is 100% stenosed.   Ost Cx to Prox Cx lesion is 30% stenosed.   Dist Graft to Insertion lesion is 99% stenosed.   A drug-eluting stent was successfully placed using a SYNERGY XD [id number].   Post intervention, there is a 0% residual stenosis.  Epic records are reviewed at length today  CHA2DS2-VASc Score =    The patient's score is based upon:        ASSESSMENT AND PLAN: Persistent Atrial Fibrillation (ICD10:  I48.0) The patient's CHA2DS2-VASc score is  6, indicating a  9.7% annual risk of stroke.   S/p DCCV on 5/9 with successful conversion. S/p Tikosyn admission 6/3-6/24.  He is currently in SR. Advised to monitor HR at home; he feels asymptomatic. Qtc stable. Continue current Tikosyn 500 mcg BID. Bmet and mag drawn today. Take potassium with food and not on empty stomach.   2. Secondary Hypercoagulable State (ICD10:  D68.69) The patient is at significant risk for stroke/thromboembolism based upon his CHA2DS2-VASc Score of  6.  Continue Warfarin (Coumadin).   Continue coumadin and INR checks as directed.  F/u in 3 months for Tikosyn surveillance.   [name],  PA-C Afib Clinic [facility] [address] [place], [place] [address] [phone]

## 2022-10-27 NOTE — Patient Instructions (Signed)
Description   Take 1 tablet today and then resume taking 1/2 tablet daily, except 1 tablet on Sunday, Tuesday, and Thursday (NEXT WEEK ONLY TAKE 1/2 ON SUNDAY WHILE YOU ARE ON PREDNISONE).  Currently: Prednisone 30mg  daily with breakfast for 14 days,then 20mg  daily with breakfast for 14 days. Recheck INR in 1 week.  Report to ER with any bleeding, falls, accidents.  Please call us at [phone] with any questions.

## 2022-11-01 DIAGNOSIS — L98499 Non-pressure chronic ulcer of skin of other sites with unspecified severity: Secondary | ICD-10-CM | POA: Diagnosis not present

## 2022-11-01 DIAGNOSIS — Z85820 Personal history of malignant melanoma of skin: Secondary | ICD-10-CM | POA: Diagnosis not present

## 2022-11-01 DIAGNOSIS — L821 Other seborrheic keratosis: Secondary | ICD-10-CM | POA: Diagnosis not present

## 2022-11-01 DIAGNOSIS — Z85828 Personal history of other malignant neoplasm of skin: Secondary | ICD-10-CM | POA: Diagnosis not present

## 2022-11-01 DIAGNOSIS — D1801 Hemangioma of skin and subcutaneous tissue: Secondary | ICD-10-CM | POA: Diagnosis not present

## 2022-11-01 DIAGNOSIS — L812 Freckles: Secondary | ICD-10-CM | POA: Diagnosis not present

## 2022-11-02 ENCOUNTER — Other Ambulatory Visit (HOSPITAL_COMMUNITY): Payer: Self-pay | Admitting: Internal Medicine

## 2022-11-02 ENCOUNTER — Ambulatory Visit: Payer: Medicare Other | Attending: Cardiovascular Disease

## 2022-11-02 DIAGNOSIS — I480 Paroxysmal atrial fibrillation: Secondary | ICD-10-CM | POA: Diagnosis not present

## 2022-11-02 DIAGNOSIS — Z7901 Long term (current) use of anticoagulants: Secondary | ICD-10-CM | POA: Diagnosis not present

## 2022-11-02 LAB — POCT INR: INR: 1.3 — AB (ref 2.0–3.0)

## 2022-11-02 NOTE — Patient Instructions (Signed)
Take 1 more tablet today and then resume taking 1/2 tablet daily, except 1 tablet on Sunday, Tuesday, and Thursday   Currently: Prednisone 30mg  daily with breakfast for 14 days,then 20mg  daily with breakfast for 14 days. Recheck INR in 1 week.  Report to ER with any bleeding, falls, accidents.  Please call us at [phone] with any questions.

## 2022-11-03 ENCOUNTER — Ambulatory Visit (HOSPITAL_COMMUNITY): Payer: Medicare Other | Admitting: Internal Medicine

## 2022-11-03 DIAGNOSIS — G4733 Obstructive sleep apnea (adult) (pediatric): Secondary | ICD-10-CM | POA: Diagnosis not present

## 2022-11-04 ENCOUNTER — Ambulatory Visit (INDEPENDENT_AMBULATORY_CARE_PROVIDER_SITE_OTHER): Payer: Medicare Other | Admitting: Internal Medicine

## 2022-11-04 ENCOUNTER — Telehealth: Payer: Self-pay | Admitting: Internal Medicine

## 2022-11-04 ENCOUNTER — Encounter: Payer: Self-pay | Admitting: Internal Medicine

## 2022-11-04 VITALS — BP 128/80 | HR 60 | Ht 70.0 in | Wt 220.6 lb

## 2022-11-04 DIAGNOSIS — R0600 Dyspnea, unspecified: Secondary | ICD-10-CM | POA: Diagnosis not present

## 2022-11-04 DIAGNOSIS — Z8679 Personal history of other diseases of the circulatory system: Secondary | ICD-10-CM | POA: Diagnosis not present

## 2022-11-04 DIAGNOSIS — J849 Interstitial pulmonary disease, unspecified: Secondary | ICD-10-CM

## 2022-11-04 DIAGNOSIS — Z8709 Personal history of other diseases of the respiratory system: Secondary | ICD-10-CM | POA: Diagnosis not present

## 2022-11-04 DIAGNOSIS — M3130 Wegener's granulomatosis without renal involvement: Secondary | ICD-10-CM | POA: Diagnosis not present

## 2022-11-04 DIAGNOSIS — Z79899 Other long term (current) drug therapy: Secondary | ICD-10-CM

## 2022-11-04 LAB — BRAIN NATRIURETIC PEPTIDE: Pro B Natriuretic peptide (BNP): 416 pg/mL — ABNORMAL HIGH (ref 0.0–100.0)

## 2022-11-04 NOTE — Telephone Encounter (Signed)
PATIENT: Gary [name] GENDER: male MRN: [id number] DOB: 05-24-1945 ADDRESS: [address] [place] [address]    Please schedule the following:  Diagnosis: ILD , immune supressed Procedure: Video bronchocoopy, flexible bronchoscopy with BAL  Envisia Classifer Transbronchial biopsy: NO  Anesthesia: general Do you need Fluro? no Size of Scope: regular Pre-med nebulized lidocaine: no Priority: whenever if I can on my schedue  Medication Restriction: none Anticoagulate/Antiplatelet: ok to take because no biopsy planned      MISCELLANEOUS KEY INSTRUCTIONS      Please let Dr Marchelle [name] know via reply phone message on Epic  Thank you     Key patient medical info     Allergy History:  Allergies  Allergen Reactions   Farxiga [Dapagliflozin] Other (See Comments)    Lightheadedness Low blood pressure   Statins Other (See Comments)    Myalgias      Current Outpatient Medications:    ALPRAZolam (XANAX) 0.5 MG tablet, Take 0.5 mg by mouth at bedtime. Takes at 11:00pm, Disp: , Rfl:    amLODipine (NORVASC) 2.5 MG tablet, Take 2.5 mg by mouth daily. Takes at 6:30am, Disp: , Rfl:    carvedilol (COREG) 25 MG tablet, Take 0.5 tablets (12.5 mg total) by mouth 2 (two) times daily., Disp: , Rfl:    CHELATED ZINC PO, Take 1 tablet by mouth daily., Disp: , Rfl:    Cholecalciferol (VITAMIN D-3 PO), Take 1 capsule by mouth daily., Disp: , Rfl:    clopidogrel (PLAVIX) 75 MG tablet, Take 75 mg by mouth daily., Disp: , Rfl:    Coenzyme Q10 (COQ10 PO), Take 1 capsule by mouth daily., Disp: , Rfl:    dofetilide (TIKOSYN) 500 MCG capsule, TAKE 1 CAPSULE BY MOUTH 2 TIMES DAILY., Disp: 180 capsule, Rfl: 1   Evolocumab (REPATHA SURECLICK) 140 MG/ML SOAJ, INJECT 140 MG INTO THE SKIN EVERY 14 (FOURTEEN) DAYS. INJECT 1 PEN INTO THE FATTY TISSUE SKIN EVERY 14 DAY, Disp: 6 mL, Rfl: 3   famotidine (PEPCID) 20 MG tablet, Take 1 tablet (20 mg total) by mouth 2 (two) times daily., Disp:  , Rfl:    ferrous sulfate 325 (65 FE) MG EC tablet, Take 325 mg by mouth every Monday, Wednesday, and Friday., Disp: , Rfl:    furosemide (LASIX) 40 MG tablet, Take 1 tablet (40 mg total) by mouth daily as needed., Disp: 30 tablet, Rfl: 3   KLOR-CON M20 20 MEQ tablet, TAKE 2 TABLETS BY MOUTH DAILY (Patient taking differently: Take 20 mEq by mouth daily.), Disp: 180 tablet, Rfl: 1   levothyroxine (SYNTHROID, LEVOTHROID) 137 MCG tablet, Take 137 mcg by mouth daily before breakfast., Disp: , Rfl:    loratadine (CLARITIN) 10 MG tablet, Take 10 mg by mouth daily., Disp: , Rfl:    metFORMIN (GLUCOPHAGE) 500 MG tablet, Take 500 mg by mouth 2 (two) times daily., Disp: , Rfl: 12   Multiple Vitamins-Minerals (CENTRUM SILVER 50+MEN) TABS, Take 1 tablet by mouth daily., Disp: , Rfl:    nitroGLYCERIN (NITROSTAT) 0.4 MG SL tablet, Place 1 tablet (0.4 mg total) under the tongue every 5 (five) minutes x 3 doses as needed for chest pain., Disp: 25 tablet, Rfl: 12   pantoprazole (PROTONIX) 40 MG tablet, Take 40 mg by mouth daily., Disp: , Rfl:    prednisoLONE 5 MG TABS tablet, Tapered dose 20 mg daily, Disp: , Rfl:    riTUXimab (RITUXAN IV), Inject 1 application  into the vein See admin instructions. Take every two weeks  and every six months alternating, Disp: , Rfl:    Semaglutide (OZEMPIC, 0.25 OR 0.5 MG/DOSE, [place]), Inject 0.5 mg into the skin once a week., Disp: , Rfl:    testosterone cypionate (DEPOTESTOSTERONE CYPIONATE) 200 MG/ML injection, Inject 150 mg into the muscle every 14 (fourteen) days., Disp: , Rfl:    venlafaxine XR (EFFEXOR-XR) 150 MG 24 hr capsule, Take 150 mg by mouth daily., Disp: , Rfl:    warfarin (COUMADIN) 5 MG tablet, TAKE 1/2 TABLET TO 1 TABLET BY MOUTH DAILY AS DIRECTED BY COUMADIN CLINIC (Patient taking differently: Take 2.5-5 mg by mouth See admin instructions. Take 2.5 mg once daily on Monday, Wednesday, Friday. 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    has a past surgical history that includes Coronary artery bypass graft (2006); Coronary angioplasty; Carpal tunnel release; Eye surgery; Total hip arthroplasty (01/03/2012); Cardioversion (N/A, 12/06/2014); Cardioversion (N/A, 05/26/2017); Cardioversion (N/A, 04/25/2019); LEFT HEART CATH AND CORS/GRAFTS ANGIOGRAPHY (N/A, 09/28/2021); CORONARY STENT INTERVENTION (N/A, 09/28/2021); Cardioversion (N/A, 02/26/2022); and Cardioversion (N/A, 05/13/2022).   SIGNATURE    Dr. Kalman Shan, M.D., F.C.C.P,  Pulmonary and Critical Care Medicine Staff Physician, [facility] Center Director - Interstitial Lung Disease  Program  Pulmonary Fibrosis [facility] [place], [place], [address]  Pager: [phone], If no answer or between  15:00h - 7:00h: call [phone] Telephone: [phone]  6:03 PM 11/04/2022

## 2022-11-04 NOTE — Progress Notes (Signed)
Inpatient consultation Dr. Isaiah Serge 09/18/2022  History of Present Illness:  77 year old male who presented to the emergency room on 9/13 with chief complaint of 3-day history of worsening shortness of breath.  Has oxygen at home, but typically does not use or needed.  Noting pulse oximetry to drop into the 80s with any exertion, progressed to the point where he was short of breath just talking.  Did endorse mildly productive cough worse than his baseline chronic cough.  No fever chills or chest discomfort no nausea or vomiting no sick exposure.  COVID test outpatient was negative ER evaluation: CT chest was negative for pulmonary emboli.  There was progressive bilateral groundglass opacities throughout both lungs much worse than when comparing CT imaging 6 months ago BNP was 322, troponin I negative. He was placed on supplemental oxygen, pulse IV steroids, and broad-spectrum antibiotics. Pulmonary asked to evaluate for further recommendations on management   He has history of Wegener's diagnosed 5 years ago.  He has been on Rituxan for the past 5 years and chronic prednisone at 5 mg [initially on 60 mg of prednisone].  The Rituxan dose was increased 1 year ago due to poor control of symptoms   He used to run a Administrator, sports.  Quit smoking at the age of 60.  No significant exposures, travel history.  No family history of lung disease.  He was started on portable oxygen through the CPAP and with exertion 6 months ago during hospitalization for pneumonia.   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  Differential diagnosis includes flare of known underlying ILD, pneumonia (bacterial versus viral) less likely pulmonary edema.  In retrospect his episode of pneumonia in February 2024 may have been an ILD flare.  Now he has extensive opacities and likely some underlying fibrosis   Plan/rec Continue supplemental oxygen Continue Solu-Medrol at 40 IV every 12.  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 Chilton Greathouse in the hospital setting.  Subjective:  Patient ID: JAQUIS DUPREE, male , DOB: 04-28-1945 , age 23 y.o. , MRN: [id number] , ADDRESS: [address] [place] [address] PCP Charlane Ferretti, DO Patient Care Team: Charlane Ferretti, DO as PCP - General (Internal Medicine) Chilton Si, MD as PCP - Cardiology (Cardiology)  This Provider for this visit: Treatment Team:  Attending Provider: Kalman Shan, MD    11/04/2022 -   Chief Complaint  Patient presents with   Pulmonary Consult    ILD- Referred by Dr. Cherlynn June. Pt with Wegener's dz.      HPI DRAVYN REDWOOD 77 y.o. -is a posthospital follow-up.  He is a patient of Dr. Dierdre Forth and he decided to establish his care with [name] in the ILD clinic.  History is gained from review of the medical records  and also talking to him.  He tells me that he has had Wegener's for 5 years.  In initially started in the joints.  Has been on Rituxan every 6 months 1 single dose.  Then in September 2024 because the flareup [see above medical record] he is now on Rituxan  every 6 months but 2 doses day 0 and day 15.  It appears he also had an admission in February 2024 and in retrospect pulmonary Dr. Isaiah Serge thought during the hospital stay in September whether this was an ILD flare.  Review of his images show that he has had a CT abdomen in 2008 where the lung images were pristine but the next CT is in February 2024 that shows groundglass opacities which appear to be worse in September 2024.  The latest CT in September 20 21st with contrast.  Tomorrow he is going to get a high-resolution CT chest without contrast and it has been ordered by his primary care physician.  Since discharge he is using oxygen with exertion and at night.  He says it is only required when he does groceries or yard work.  He is on a tapering schedule of prednisone that was.  Set.  Is currently on 20 mg prednisone for the next 2 weeks and then it appears he is slowly going to come to baseline of 5 mg.  His baseline dose is because of his Wegener's and because it helps his sinuses.  He did an ILD questionnaire and it is as below.   [facility] Comprehensive ILD Questionnaire  Symptoms:   SYMPTOM SCALE - ILD 11/04/2022  Current weight   O2 use ra  Shortness of Breath 0 -> 5 scale with 5 being worst (score 6 If unable to do)  At rest 0  Simple tasks - showers, clothes change, eating, shaving 4  Household (dishes, doing bed, laundry) 4  Shopping 4  Walking level at own pace 2  Walking up Stairs 4  Total (30-36) Dyspnea Score 18      Non-dyspnea symptoms (0-> 5 scale) 11/04/2022  How bad is your cough? 1  How bad is your fatigue 4  How bad is nausea Not bad  How bad is vomiting?  Not bad  How bad is diarrhea? 0  How bad is anxiety? 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 - Has diabetes for the last several months - Also marked positive for rheumatoid arthritis and not  sure - He says he has had pneumonia - Diagnose of heart failure and otherwise specified.  EF in February 2020 for global hypokinesis and EF 50% -He has significant coronary artery disease which she believes that the Wegener's makes it worse.   ROS:  -Positive for fatigue - He has a little dysphagia - She is lost 20 pounds of weight in the last few months - She does have some ulcers in the mouth for the last several weeks that he says will not go away  FAMILY HISTORY of LUNG DISEASE:  -His wife has autoimmune disease  PERSONAL EXPOSURE HISTORY:  -He started smoking at age 36.  He stopped smoking at age 35.  He smoked 2 packs a day.  He never vape marijuana.  Never did cocaine.  HOME  EXPOSURE and HOBBY DETAILS :  -Single-family home which is [address] years old [place].  He is lived there for 30 years.  Detail organic antigen exposure history is negative  OCCUPATIONAL HISTORY (122 questions) : That she is retired.  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  He did some backyard gardening otherwise detail organic and inorganic antigen exposure history is negative for                       PULMONARY TOXICITY HISTORY (27 items):  He is on chronic prednisone That she is on Rituxan That she is on beta-blocker That she was on hydrochlorothiazide between 2019 and June 2024  INVESTIGATIONS: x     CT Chest data from date: As below  - personally visualized and independently interpreted : A lot of groundglass opacities and possible NSIP - my findings are: As above   Narrative & Impression  CLINICAL DATA:  Shortness of breath, evaluate for PE   EXAM: CT ANGIOGRAPHY CHEST WITH CONTRAST   TECHNIQUE: Multidetector CT imaging of the chest was performed using the standard protocol during bolus administration of intravenous contrast. Multiplanar CT image reconstructions and MIPs were obtained to evaluate the vascular anatomy.   RADIATION DOSE REDUCTION: This exam  was performed according to the departmental dose-optimization program which includes automated exposure control, adjustment of the mA and/or kV according to patient size and/or use of iterative reconstruction technique.   CONTRAST:  OMNIPAQUE IOHEXOL 350 MG/ML SOLN   COMPARISON:  Chest radiograph dated 09/17/2022   FINDINGS: Cardiovascular: Satisfactory opacification the bilateral pulmonary arteries to the segmental level. No evidence of pulmonary embolism   Study is not tailored for evaluation of the thoracic aorta. No evidence of thoracic aortic aneurysm or dissection.   Cardiomegaly.  No pericardial effusion.   Severe three-vessel coronary atherosclerosis with LAD stents.   Mediastinum/Nodes: Small mediastinal nodes, including a dominant 15 mm short axis right hilar node.   Visualized thyroid is unremarkable.   Lungs/Pleura: Progressive patchy ground-glass opacities in the lungs bilaterally, lower lobe and subpleural/peripheral predominant, compatible with progressive chronic interstitial lung disease. Associated mild subpleural reticulation/fibrosis. 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   Superimposed mild patchy opacities in the anterior left upper lobe (series 6/image 55), grossly unchanged from the prior, likely reflecting scarring.   No focal consolidation.   No suspicious pulmonary nodules.   Mild paraseptal emphysematous changes in the upper lobes.   No pleural effusion or pneumothorax.   Upper Abdomen: Visualized upper abdomen is notable for tiny layering gallstones and vascular calcifications.   Musculoskeletal: Degenerative changes of the visualized thoracolumbar spine. Median sternotomy.   Review of the MIP images confirms the above findings.   IMPRESSION: No evidence of pulmonary embolism.   Progressive chronic interstitial lung disease, favoring fibrotic NSIP.   Aortic  Atherosclerosis (ICD10-I70.0) and Emphysema (ICD10-J43.9).     Electronically Signed   By: Charline Bills M.D.   On: 09/17/2022 17:13       Simple office walk 224 (66+46 x 2) feet Pod A at [facility] x  3 laps goal with forehead probe 11/04/2022    O2 used ra   Number laps completed 2 of 3   Comments about pace sav   Resting Pulse Ox/HR 99% and 60/min   Final Pulse Ox/HR 90% and 85/min   Desaturated </= 88% no   Desaturated <= 3% points yes   Got Tachycardic >/= 90/min no   Symptoms at end of test Dyspnea/faitgue  Miscellaneous comments x      PFT      No data to display           Latest Reference Range & Units 09/18/22 11:50  ANA Ab, IFA  Negative  CCP Antibodies IgG/IgA 0 - 19 units 9  RA Latex Turbid. <14.0 IU/mL 10.2  Cytoplasmic (C-ANCA) Neg:<1:20 titer <1:20  P-ANCA Neg:<1:20 titer <1:20  Atypical P-ANCA titer Neg:<1:20 titer <1:20    LAB RESULTS last 96 hours No results found.  LAB RESULTS last 90 days Recent Results (from the past 2160 hour(s))  POCT INR     Status: None   Collection Time: 08/11/22  9:55 AM  Result Value Ref Range   INR 2.0 2.0 - 3.0   POC INR    Basic metabolic panel     Status: Abnormal   Collection Time: 09/17/22  2:48 PM  Result Value Ref Range   Sodium 139 135 - 145 mmol/L   Potassium 4.3 3.5 - 5.1 mmol/L   Chloride 105 98 - 111 mmol/L   CO2 24 22 - 32 mmol/L   Glucose, Bld 107 (H) 70 - 99 mg/dL    Comment: Glucose reference range applies only to samples taken after fasting for at least 8 hours.   BUN 16 8 - 23 mg/dL   Creatinine, Ser 1.30 0.61 - 1.24 mg/dL   Calcium 8.7 (L) 8.9 - 10.3 mg/dL   GFR, Estimated >86 >57 mL/min    Comment: (NOTE) Calculated using the CKD-EPI Creatinine Equation (2021)    Anion gap 10 5 - 15    Comment: Performed at [facility], [address] [place], [place] [address]  Troponin I (High Sensitivity)     Status: None   Collection Time: 09/17/22  2:48 PM  Result  Value Ref Range   Troponin I (High Sensitivity) 15 <18 ng/L    Comment: (NOTE) Elevated high sensitivity troponin I (hsTnI) values and significant  changes across serial measurements may suggest ACS but many other  chronic and acute conditions are known to elevate hsTnI results.  Refer to the "Links" section for chest pain algorithms and additional  guidance. Performed at [facility], [address] [place], [place] [address]   CBC with Differential     Status: Abnormal   Collection Time: 09/17/22  2:48 PM  Result Value Ref Range   WBC 10.6 (H) 4.0 - 10.5 K/uL   RBC 4.70 4.22 - 5.81 MIL/uL   Hemoglobin 11.8 (L) 13.0 - 17.0 g/dL   HCT 41.3 (L) 24.4 - 01.0 %   MCV 79.1 (L) 80.0 - 100.0 fL   MCH 25.1 (L) 26.0 - 34.0 pg   MCHC 31.7 30.0 - 36.0 g/dL   RDW 27.2 (H) 53.6 - 64.4 %   Platelets 436 (H) 150 - 400 K/uL   nRBC 0.0 0.0 - 0.2 %   Neutrophils Relative % 82 %   Neutro Abs 8.7 (H) 1.7 - 7.7 K/uL   Lymphocytes Relative 7 %   Lymphs Abs 0.8 0.7 - 4.0 K/uL   Monocytes Relative 7 %   Monocytes Absolute 0.8 0.1 - 1.0 K/uL   Eosinophils Relative 2 %   Eosinophils Absolute 0.2 0.0 - 0.5 K/uL   Basophils Relative 1 %   Basophils Absolute 0.1 0.0 - 0.1 K/uL   Immature Granulocytes 1 %   Abs Immature Granulocytes 0.07 0.00 - 0.07 K/uL    Comment: Performed at [facility], [phone], [place], [place] [address]  Resp panel by RT-PCR (RSV, Flu A&B, Covid) Anterior Nasal Swab     Status: None   Collection Time: 09/17/22  2:48 PM   Specimen: Anterior Nasal Swab  Result Value Ref Range   SARS Coronavirus 2 by RT PCR NEGATIVE NEGATIVE    Comment: (NOTE) SARS-CoV-2 target nucleic acids are NOT DETECTED.  The SARS-CoV-2 RNA is generally detectable in upper respiratory specimens during the acute phase of infection. The lowest concentration of SARS-CoV-2 viral copies this assay can detect is 138 copies/mL. A negative result does not preclude  SARS-Cov-2 infection and should not be used as the sole basis for treatment or other patient management decisions. A negative result may occur with  improper specimen collection/handling, submission of specimen other than nasopharyngeal swab, presence of viral mutation(s) within the areas targeted by this assay, and inadequate number of viral copies(<138 copies/mL). A negative result must be combined with clinical observations, patient history, and epidemiological information. The expected result is Negative.  Fact Sheet for Patients:  BloggerCourse.com  Fact Sheet for Healthcare Providers:  SeriousBroker.it  This test is no t yet approved or cleared by the [place] FDA and  has been authorized for detection and/or diagnosis of SARS-CoV-2 by FDA under an Emergency Use Authorization (EUA). This EUA will remain  in effect (meaning this test can be used) for the duration of the COVID-19 declaration under Section 564(b)(1) of the Act, 21 U.S.C.section 360bbb-3(b)(1), unless the authorization is terminated  or revoked sooner.       Influenza A by PCR NEGATIVE NEGATIVE   Influenza B by PCR NEGATIVE NEGATIVE    Comment: (NOTE) The Xpert Xpress SARS-CoV-2/FLU/RSV plus assay is intended as an aid in the diagnosis of influenza from Nasopharyngeal swab specimens and should not be used as a sole basis for treatment. Nasal washings and aspirates are unacceptable for Xpert Xpress SARS-CoV-2/FLU/RSV testing.  Fact Sheet for Patients: BloggerCourse.com  Fact Sheet for Healthcare Providers: SeriousBroker.it  This test is not yet approved or cleared by the [place] FDA and has been authorized for detection and/or diagnosis of SARS-CoV-2 by FDA under an Emergency Use Authorization (EUA). This EUA will remain in effect (meaning this test can be used) for the duration of the COVID-19  declaration under Section 564(b)(1) of the Act, 21 U.S.C. section 360bbb-3(b)(1), unless the authorization is terminated or revoked.     Resp Syncytial Virus by PCR NEGATIVE NEGATIVE    Comment: (NOTE) Fact Sheet for Patients: BloggerCourse.com  Fact Sheet for Healthcare Providers: SeriousBroker.it  This test is not yet approved or cleared by the [place] FDA and has been authorized for detection and/or diagnosis of SARS-CoV-2 by FDA under an Emergency Use Authorization (EUA). This EUA will remain in effect (meaning this test can be used) for the duration of the COVID-19 declaration under Section 564(b)(1) of the Act, 21 U.S.C. section 360bbb-3(b)(1), unless the authorization is terminated or revoked.  Performed at [facility], [name] [place], [place] [address]   Brain natriuretic peptide     Status: Abnormal   Collection Time: 09/17/22  2:48 PM  Result Value Ref Range   B Natriuretic Peptide 322.1 (H) 0.0 - 100.0 pg/mL    Comment: Performed at [facility], [address] [place], [place] [address]  Protime-INR     Status: Abnormal   Collection Time: 09/17/22  2:48 PM  Result Value Ref Range   Prothrombin Time 21.0 (H) 11.4 - 15.2 seconds  INR 1.8 (H) 0.8 - 1.2    Comment: (NOTE) INR goal varies based on device and disease states. Performed at [facility], [address] [place], [place] [address]   Hepatic function panel     Status: None   Collection Time: 09/17/22  2:48 PM  Result Value Ref Range   Total Protein 6.9 6.5 - 8.1 g/dL   Albumin 3.9 3.5 - 5.0 g/dL   AST 28 15 - 41 U/L   ALT 18 0 - 44 U/L   Alkaline Phosphatase 116 38 - 126 U/L   Total Bilirubin 0.6 0.3 - 1.2 mg/dL   Bilirubin, Direct 0.1 0.0 - 0.2 mg/dL   Indirect Bilirubin 0.5 0.3 - 0.9 mg/dL    Comment: Performed at [facility], [address],  [place], [place] [address]  I-Stat venous blood gas, [facility], [facility], [facility])     Status: Abnormal   Collection Time: 09/17/22  3:12 PM  Result Value Ref Range   pH, Ven 7.425 7.25 - 7.43   pCO2, Ven 39.1 (L) 44 - 60 mmHg   pO2, Ven 23 (LL) 32 - 45 mmHg   Bicarbonate 25.7 20.0 - 28.0 mmol/L   TCO2 27 22 - 32 mmol/L   O2 Saturation 43 %   Acid-Base Excess 1.0 0.0 - 2.0 mmol/L   Sodium 141 135 - 145 mmol/L   Potassium 4.2 3.5 - 5.1 mmol/L   Calcium, Ion 1.20 1.15 - 1.40 mmol/L   HCT 38.0 (L) 39.0 - 52.0 %   Hemoglobin 12.9 (L) 13.0 - 17.0 g/dL   Collection site IV start    Drawn by Nurse    Sample type VENOUS    Comment NOTIFIED PHYSICIAN   Troponin I (High Sensitivity)     Status: None   Collection Time: 09/17/22  4:51 PM  Result Value Ref Range   Troponin I (High Sensitivity) 14 <18 ng/L    Comment: (NOTE) Elevated high sensitivity troponin I (hsTnI) values and significant  changes across serial measurements may suggest ACS but many other  chronic and acute conditions are known to elevate hsTnI results.  Refer to the "Links" section for chest pain algorithms and additional  guidance. Performed at [facility], [address], [place], [place] [address]   MRSA culture     Status: None   Collection Time: 09/17/22  9:31 PM   Specimen: Nasal Mucosa; Body Fluid  Result Value Ref Range   Specimen Description NASOPHARYNGEAL    Special Requests NONE    Culture      NO MRSA DETECTED Performed at [facility], [address] [place], [place] [address]    Report Status 09/20/2022 FINAL   Urinalysis, Routine w reflex microscopic -Urine, Clean Catch     Status: Abnormal   Collection Time: 09/17/22 10:36 PM  Result Value Ref Range   Color, Urine YELLOW YELLOW   APPearance HAZY (A) CLEAR   Specific Gravity, Urine 1.010 1.005 - 1.030   pH 6.5 5.0 - 8.0   Glucose, UA NEGATIVE NEGATIVE mg/dL   Hgb urine dipstick NEGATIVE NEGATIVE   Bilirubin Urine NEGATIVE NEGATIVE    Ketones, ur NEGATIVE NEGATIVE mg/dL   Protein, ur NEGATIVE NEGATIVE mg/dL   Nitrite NEGATIVE NEGATIVE   Leukocytes,Ua SMALL (A) NEGATIVE    Comment: Performed at [facility], [address], [place], [place] [address]  Urinalysis, Microscopic (reflex)     Status: Abnormal   Collection Time: 09/17/22 10:36  PM  Result Value Ref Range   RBC / HPF 6-10 0 - 5 RBC/hpf   WBC, UA 21-50 0 - 5 WBC/hpf   Bacteria, UA RARE (A) NONE SEEN   Squamous Epithelial / HPF 0-5 0 - 5 /HPF    Comment: Performed at [facility], [address], [place], [place] [address]  Respiratory (~20 pathogens) panel by PCR     Status: None   Collection Time: 09/17/22 11:18 PM   Specimen: Nasopharyngeal Swab; Respiratory  Result Value Ref Range   Adenovirus NOT DETECTED NOT DETECTED   Coronavirus 229E NOT DETECTED NOT DETECTED    Comment: (NOTE) The Coronavirus on the Respiratory Panel, DOES NOT test for the novel  Coronavirus (2019 nCoV)    Coronavirus HKU1 NOT DETECTED NOT DETECTED   Coronavirus NL63 NOT DETECTED NOT DETECTED   Coronavirus OC43 NOT DETECTED NOT DETECTED   Metapneumovirus NOT DETECTED NOT DETECTED   Rhinovirus / Enterovirus NOT DETECTED NOT DETECTED   Influenza A NOT DETECTED NOT DETECTED   Influenza B NOT DETECTED NOT DETECTED   Parainfluenza Virus 1 NOT DETECTED NOT DETECTED   Parainfluenza Virus 2 NOT DETECTED NOT DETECTED   Parainfluenza Virus 3 NOT DETECTED NOT DETECTED   Parainfluenza Virus 4 NOT DETECTED NOT DETECTED   Respiratory Syncytial Virus NOT DETECTED NOT DETECTED   Bordetella pertussis NOT DETECTED NOT DETECTED   Bordetella Parapertussis NOT DETECTED NOT DETECTED   Chlamydophila pneumoniae NOT DETECTED NOT DETECTED   Mycoplasma pneumoniae NOT DETECTED NOT DETECTED    Comment: Performed at [facility], [address], [place], [place] [address]  Magnesium     Status: None   Collection Time: 09/18/22  7:06 AM  Result Value Ref Range   Magnesium 2.1 1.7 - 2.4  mg/dL    Comment: Performed at [facility], [address], [place], [place] [address]  Phosphorus     Status: None   Collection Time: 09/18/22  7:06 AM  Result Value Ref Range   Phosphorus 2.8 2.5 - 4.6 mg/dL    Comment: Performed at [facility], [address] [place], [place] [address]  Sedimentation rate     Status: Abnormal   Collection Time: 09/18/22  7:06 AM  Result Value Ref Range   Sed Rate 38 (H) 0 - 16 mm/hr    Comment: Performed at [facility], [address] [place], [place] [address]  C-reactive protein     Status: Abnormal   Collection Time: 09/18/22  7:06 AM  Result Value Ref Range   CRP 6.6 (H) <1.0 mg/dL    Comment: Performed at [facility], [address] [place], [place] [address]  Basic metabolic panel     Status: Abnormal   Collection Time: 09/18/22  7:06 AM  Result Value Ref Range   Sodium 140 135 - 145 mmol/L   Potassium 4.0 3.5 - 5.1 mmol/L   Chloride 106 98 - 111 mmol/L   CO2 23 22 - 32 mmol/L   Glucose, Bld 139 (H) 70 - 99 mg/dL    Comment: Glucose reference range applies only to samples taken after fasting for at least 8 hours.   BUN 16 8 - 23 mg/dL   Creatinine, Ser 2.84 0.61 - 1.24 mg/dL   Calcium 8.9 8.9 - 13.2 mg/dL   GFR, Estimated >44 >01 mL/min    Comment: (NOTE) Calculated using the CKD-EPI Creatinine Equation (2021)    Anion gap  11 5 - 15    Comment: Performed at [facility], [address] [place], [place] [address]  CBC     Status: Abnormal   Collection Time: 09/18/22  7:06 AM  Result Value Ref Range   WBC 10.1 4.0 - 10.5 K/uL   RBC 4.91 4.22 - 5.81 MIL/uL   Hemoglobin 12.1 (L) 13.0 - 17.0 g/dL   HCT 73.2 20.2 - 54.2 %   MCV 80.0 80.0 - 100.0 fL   MCH 24.6 (L) 26.0 - 34.0 pg   MCHC 30.8 30.0 - 36.0 g/dL   RDW 70.6 (H) 23.7 - 62.8 %   Platelets 458 (H) 150 - 400 K/uL   nRBC 0.0 0.0 - 0.2 %    Comment: Performed at [facility], [address] [place], [place] [address]  Protime-INR      Status: Abnormal   Collection Time: 09/18/22  7:06 AM  Result Value Ref Range   Prothrombin Time 19.9 (H) 11.4 - 15.2 seconds   INR 1.7 (H) 0.8 - 1.2    Comment: (NOTE) INR goal varies based on device and disease states. Performed at [facility], [address], [place], [place] [address]   Hemoglobin A1c     Status: Abnormal   Collection Time: 09/18/22  7:06 AM  Result Value Ref Range   Hgb A1c MFr Bld 6.5 (H) 4.8 - 5.6 %    Comment: (NOTE)         Prediabetes: 5.7 - 6.4         Diabetes: >6.4         Glycemic control for adults with diabetes: <7.0    Mean Plasma Glucose 140 mg/dL    Comment: (NOTE) Performed At: [facility] [address] [place], [place] [id number] Jolene Schimke MD WN:[id number]   Glucose, capillary     Status: Abnormal   Collection Time: 09/18/22  8:05 AM  Result Value Ref Range   Glucose-Capillary 157 (H) 70 - 99 mg/dL    Comment: Glucose reference range applies only to samples taken after fasting for at least 8 hours.  Glucose, capillary     Status: Abnormal   Collection Time: 09/18/22 11:42 AM  Result Value Ref Range   Glucose-Capillary 173 (H) 70 - 99 mg/dL    Comment: Glucose reference range applies only to samples taken after fasting for at least 8 hours.  Procalcitonin     Status: None   Collection Time: 09/18/22 11:50 AM  Result Value Ref Range   Procalcitonin <0.10 ng/mL    Comment:        Interpretation: PCT (Procalcitonin) <= 0.5 ng/mL: Systemic infection (sepsis) is not likely. Local bacterial infection is possible. (NOTE)       Sepsis PCT Algorithm           Lower Respiratory Tract                                      Infection PCT Algorithm    ----------------------------     ----------------------------         PCT < 0.25 ng/mL                PCT < 0.10 ng/mL          Strongly encourage             Strongly discourage   discontinuation of  antibiotics    initiation of antibiotics    ----------------------------      -----------------------------       PCT 0.25 - 0.50 ng/mL            PCT 0.10 - 0.25 ng/mL               OR       >80% decrease in PCT            Discourage initiation of                                            antibiotics      Encourage discontinuation           of antibiotics    ----------------------------     -----------------------------         PCT >= 0.50 ng/mL              PCT 0.26 - 0.50 ng/mL               AND        <80% decrease in PCT             Encourage initiation of                                             antibiotics       Encourage continuation           of antibiotics    ----------------------------     -----------------------------        PCT >= 0.50 ng/mL                  PCT > 0.50 ng/mL               AND         increase in PCT                  Strongly encourage                                      initiation of antibiotics    Strongly encourage escalation           of antibiotics                                     -----------------------------                                           PCT <= 0.25 ng/mL                                                 OR                                        >  80% decrease in PCT                                      Discontinue / Do not initiate                                             antibiotics  Performed at [facility], [address], [place], [place] [address]   ANCA Titers     Status: None   Collection Time: 09/18/22 11:50 AM  Result Value Ref Range   C-ANCA <1:20 Neg:<1:20 titer   P-ANCA <1:20 Neg:<1:20 titer    Comment: (NOTE) The presence of positive fluorescence exhibiting P-ANCA or C-ANCA patterns alone is not specific for the diagnosis of Wegener's Granulomatosis (WG) or microscopic polyangiitis. Decisions about treatment should not be based solely on ANCA IFA results.  The International ANCA Group Consensus recommends follow up testing of positive sera with both PR-3 and MPO-ANCA  enzyme immunoassays. As many as 5% serum samples are positive only by EIA. Ref. AM J Clin Pathol 1999;111:507-513.    Atypical P-ANCA titer <1:20 Neg:<1:20 titer    Comment: (NOTE) The atypical pANCA pattern has been observed in a significant percentage of patients with ulcerative colitis, primary sclerosing cholangitis and autoimmune hepatitis. Performed At: [facility] [address] [place], [place] [id number] Jolene Schimke MD [id number]   ANA, IFA (with reflex)     Status: None   Collection Time: 09/18/22 11:50 AM  Result Value Ref Range   ANA Ab, IFA Negative     Comment: (NOTE)                                     Negative   <1:80                                     Borderline  1:80                                     Positive   >1:80 ICAP nomenclature: AC-0 For more information about Hep-2 cell patterns use ANApatterns.org, the official website for the International Consensus on Antinuclear Antibody (ANA) Patterns (ICAP). Performed At: [facility] [address] [place], [place] [id number] Jolene Schimke MD [id number]   Rheumatoid factor     Status: None   Collection Time: 09/18/22 11:50 AM  Result Value Ref Range   Rheumatoid fact SerPl-aCnc 10.2 <14.0 IU/mL    Comment: (NOTE) Performed At: [facility] [address] [name], [place] [id number] Jolene Schimke MD [id number]   CYCLIC CITRUL PEPTIDE ANTIBODY, IGG/IGA     Status: None   Collection Time: 09/18/22 11:50 AM  Result Value Ref Range   CCP Antibodies IgG/IgA 9 0 - 19 units    Comment: (NOTE)                          Negative               <20  Weak positive      20 - 39                          Moderate positive  40 - 59                          Strong positive        >59 Performed At: [facility] [address] [place], [place] [id number] Jolene Schimke MD [id number]   Glucose, capillary     Status: Abnormal   Collection Time:  09/18/22  4:38 PM  Result Value Ref Range   Glucose-Capillary 162 (H) 70 - 99 mg/dL    Comment: Glucose reference range applies only to samples taken after fasting for at least 8 hours.  Glucose, capillary     Status: Abnormal   Collection Time: 09/18/22  7:34 PM  Result Value Ref Range   Glucose-Capillary 182 (H) 70 - 99 mg/dL    Comment: Glucose reference range applies only to samples taken after fasting for at least 8 hours.  Glucose, capillary     Status: Abnormal   Collection Time: 09/19/22  7:36 AM  Result Value Ref Range   Glucose-Capillary 164 (H) 70 - 99 mg/dL    Comment: Glucose reference range applies only to samples taken after fasting for at least 8 hours.  Protime-INR     Status: Abnormal   Collection Time: 09/19/22  7:41 AM  Result Value Ref Range   Prothrombin Time 23.4 (H) 11.4 - 15.2 seconds   INR 2.1 (H) 0.8 - 1.2    Comment: (NOTE) INR goal varies based on device and disease states. Performed at [facility], [address], [place], [place] [address]   Comprehensive metabolic panel     Status: Abnormal   Collection Time: 09/19/22  7:41 AM  Result Value Ref Range   Sodium 139 135 - 145 mmol/L   Potassium 3.6 3.5 - 5.1 mmol/L   Chloride 107 98 - 111 mmol/L   CO2 22 22 - 32 mmol/L   Glucose, Bld 150 (H) 70 - 99 mg/dL    Comment: Glucose reference range applies only to samples taken after fasting for at least 8 hours.   BUN 21 8 - 23 mg/dL   Creatinine, Ser 4.69 0.61 - 1.24 mg/dL   Calcium 8.9 8.9 - 62.9 mg/dL   Total Protein 6.9 6.5 - 8.1 g/dL   Albumin 3.4 (L) 3.5 - 5.0 g/dL   AST 26 15 - 41 U/L   ALT 21 0 - 44 U/L   Alkaline Phosphatase 99 38 - 126 U/L   Total Bilirubin 0.7 0.3 - 1.2 mg/dL   GFR, Estimated >52 >84 mL/min    Comment: (NOTE) Calculated using the CKD-EPI Creatinine Equation (2021)    Anion gap 10 5 - 15    Comment: Performed at [facility], [address], [place], [place] [address]  Magnesium     Status: None    Collection Time: 09/19/22  7:41 AM  Result Value Ref Range   Magnesium 2.1 1.7 - 2.4 mg/dL    Comment: Performed at [facility], [address] [place], [place] [address]  CBC     Status: Abnormal   Collection Time: 09/19/22  7:41 AM  Result Value Ref Range   WBC 18.3 (H) 4.0 - 10.5 K/uL   RBC 4.90 4.22 - 5.81  MIL/uL   Hemoglobin 12.5 (L) 13.0 - 17.0 g/dL   HCT 16.1 09.6 - 04.5 %   MCV 81.4 80.0 - 100.0 fL   MCH 25.5 (L) 26.0 - 34.0 pg   MCHC 31.3 30.0 - 36.0 g/dL   RDW 40.9 (H) 81.1 - 91.4 %   Platelets 509 (H) 150 - 400 K/uL   nRBC 0.0 0.0 - 0.2 %    Comment: Performed at [facility], [address], [place], [place] [address]  Glucose, capillary     Status: Abnormal   Collection Time: 09/19/22 12:19 PM  Result Value Ref Range   Glucose-Capillary 188 (H) 70 - 99 mg/dL    Comment: Glucose reference range applies only to samples taken after fasting for at least 8 hours.  Glucose, capillary     Status: Abnormal   Collection Time: 09/19/22  3:17 PM  Result Value Ref Range   Glucose-Capillary 174 (H) 70 - 99 mg/dL    Comment: Glucose reference range applies only to samples taken after fasting for at least 8 hours.  Glucose, capillary     Status: Abnormal   Collection Time: 09/19/22  8:02 PM  Result Value Ref Range   Glucose-Capillary 212 (H) 70 - 99 mg/dL    Comment: Glucose reference range applies only to samples taken after fasting for at least 8 hours.  Protime-INR     Status: Abnormal   Collection Time: 09/20/22  8:20 AM  Result Value Ref Range   Prothrombin Time 30.8 (H) 11.4 - 15.2 seconds   INR 2.9 (H) 0.8 - 1.2    Comment: (NOTE) INR goal varies based on device and disease states. Performed at [facility], [address] [place], [place] [address]   CBC     Status: Abnormal   Collection Time: 09/20/22  8:20 AM  Result Value Ref Range   WBC 18.2 (H) 4.0 - 10.5 K/uL   RBC 4.88 4.22 - 5.81 MIL/uL   Hemoglobin 12.0 (L) 13.0 - 17.0 g/dL   HCT  86.5 78.4 - 69.6 %   MCV 80.3 80.0 - 100.0 fL   MCH 24.6 (L) 26.0 - 34.0 pg   MCHC 30.6 30.0 - 36.0 g/dL   RDW 29.5 (H) 28.4 - 13.2 %   Platelets 507 (H) 150 - 400 K/uL   nRBC 0.0 0.0 - 0.2 %    Comment: Performed at [facility], [address] [place], [place] [address]  Glucose, capillary     Status: Abnormal   Collection Time: 09/20/22  8:44 AM  Result Value Ref Range   Glucose-Capillary 157 (H) 70 - 99 mg/dL    Comment: Glucose reference range applies only to samples taken after fasting for at least 8 hours.  MRSA Next Gen by PCR, Nasal     Status: None   Collection Time: 09/20/22 10:21 AM   Specimen: Nasal Mucosa; Nasal Swab  Result Value Ref Range   MRSA by PCR Next Gen NOT DETECTED NOT DETECTED    Comment: (NOTE) The GeneXpert MRSA Assay (FDA approved for NASAL specimens only), is one component of a comprehensive MRSA colonization surveillance program. It is not intended to diagnose MRSA infection nor to guide or monitor treatment for MRSA infections. Test performance is not FDA approved in patients less than 58 years old. Performed at [facility], [address] [place], [place] [address]   Glucose, capillary     Status: Abnormal   Collection  Time: 09/20/22 12:14 PM  Result Value Ref Range   Glucose-Capillary 209 (H) 70 - 99 mg/dL    Comment: Glucose reference range applies only to samples taken after fasting for at least 8 hours.  Glucose, capillary     Status: Abnormal   Collection Time: 09/20/22  4:11 PM  Result Value Ref Range   Glucose-Capillary 171 (H) 70 - 99 mg/dL    Comment: Glucose reference range applies only to samples taken after fasting for at least 8 hours.  Glucose, capillary     Status: Abnormal   Collection Time: 09/20/22  9:06 PM  Result Value Ref Range   Glucose-Capillary 164 (H) 70 - 99 mg/dL    Comment: Glucose reference range applies only to samples taken after fasting for at least 8 hours.  Protime-INR     Status: Abnormal    Collection Time: 09/21/22  7:42 AM  Result Value Ref Range   Prothrombin Time 30.0 (H) 11.4 - 15.2 seconds   INR 2.8 (H) 0.8 - 1.2    Comment: (NOTE) INR goal varies based on device and disease states. Performed at [facility], [address], [place], [place] [address]   CBC     Status: Abnormal   Collection Time: 09/21/22  7:42 AM  Result Value Ref Range   WBC 17.0 (H) 4.0 - 10.5 K/uL   RBC 4.73 4.22 - 5.81 MIL/uL   Hemoglobin 11.6 (L) 13.0 - 17.0 g/dL   HCT 35.5 (L) 73.2 - 20.2 %   MCV 79.3 (L) 80.0 - 100.0 fL   MCH 24.5 (L) 26.0 - 34.0 pg   MCHC 30.9 30.0 - 36.0 g/dL   RDW 54.2 (H) 70.6 - 23.7 %   Platelets 482 (H) 150 - 400 K/uL   nRBC 0.0 0.0 - 0.2 %    Comment: Performed at [facility], [address], [place], [place] [address]  Basic metabolic panel     Status: Abnormal   Collection Time: 09/21/22  7:42 AM  Result Value Ref Range   Sodium 141 135 - 145 mmol/L   Potassium 3.2 (L) 3.5 - 5.1 mmol/L   Chloride 107 98 - 111 mmol/L   CO2 22 22 - 32 mmol/L   Glucose, Bld 161 (H) 70 - 99 mg/dL    Comment: Glucose reference range applies only to samples taken after fasting for at least 8 hours.   BUN 30 (H) 8 - 23 mg/dL   Creatinine, Ser 5.17 0.61 - 1.24 mg/dL   Calcium 8.8 (L) 8.9 - 10.3 mg/dL   GFR, Estimated >61 >60 mL/min    Comment: (NOTE) Calculated using the CKD-EPI Creatinine Equation (2021)    Anion gap 12 5 - 15    Comment: Performed at [facility], [address], [place], [place] [address]  Glucose, capillary     Status: Abnormal   Collection Time: 09/21/22  7:45 AM  Result Value Ref Range   Glucose-Capillary 171 (H) 70 - 99 mg/dL    Comment: Glucose reference range applies only to samples taken after fasting for at least 8 hours.  Glucose, capillary     Status: Abnormal   Collection Time: 09/21/22 11:42 AM  Result Value Ref Range   Glucose-Capillary 174 (H) 70 - 99 mg/dL    Comment: Glucose reference range applies only to samples  taken after fasting for at least 8 hours.  Glucose, capillary     Status: Abnormal   Collection Time:  09/21/22  5:12 PM  Result Value Ref Range   Glucose-Capillary 140 (H) 70 - 99 mg/dL    Comment: Glucose reference range applies only to samples taken after fasting for at least 8 hours.  Glucose, capillary     Status: Abnormal   Collection Time: 09/21/22  9:12 PM  Result Value Ref Range   Glucose-Capillary 148 (H) 70 - 99 mg/dL    Comment: Glucose reference range applies only to samples taken after fasting for at least 8 hours.  Basic metabolic panel     Status: Abnormal   Collection Time: 09/22/22  8:10 AM  Result Value Ref Range   Sodium 140 135 - 145 mmol/L   Potassium 3.3 (L) 3.5 - 5.1 mmol/L   Chloride 106 98 - 111 mmol/L   CO2 25 22 - 32 mmol/L   Glucose, Bld 93 70 - 99 mg/dL    Comment: Glucose reference range applies only to samples taken after fasting for at least 8 hours.   BUN 31 (H) 8 - 23 mg/dL   Creatinine, Ser 4.40 0.61 - 1.24 mg/dL   Calcium 8.5 (L) 8.9 - 10.3 mg/dL   GFR, Estimated >10 >27 mL/min    Comment: (NOTE) Calculated using the CKD-EPI Creatinine Equation (2021)    Anion gap 9 5 - 15    Comment: Performed at [facility], [address] [place], [place] [address]  CBC     Status: Abnormal   Collection Time: 09/22/22  8:10 AM  Result Value Ref Range   WBC 16.8 (H) 4.0 - 10.5 K/uL   RBC 4.53 4.22 - 5.81 MIL/uL   Hemoglobin 11.1 (L) 13.0 - 17.0 g/dL   HCT 44.0 (L) 34.7 - 42.5 %   MCV 80.4 80.0 - 100.0 fL   MCH 24.5 (L) 26.0 - 34.0 pg   MCHC 30.5 30.0 - 36.0 g/dL   RDW 95.6 (H) 38.7 - 56.4 %   Platelets 444 (H) 150 - 400 K/uL   nRBC 0.1 0.0 - 0.2 %    Comment: Performed at [facility], [address] [place], [place] [address]  Protime-INR     Status: Abnormal   Collection Time: 09/22/22  8:10 AM  Result Value Ref Range   Prothrombin Time 32.1 (H) 11.4 - 15.2 seconds   INR 3.1 (H) 0.8 - 1.2    Comment: (NOTE) INR goal varies based  on device and disease states. Performed at [facility], [address] [place], [place] [address]   Glucose, capillary     Status: None   Collection Time: 09/22/22  8:13 AM  Result Value Ref Range   Glucose-Capillary 99 70 - 99 mg/dL    Comment: Glucose reference range applies only to samples taken after fasting for at least 8 hours.  Glucose, capillary     Status: Abnormal   Collection Time: 09/22/22 11:31 AM  Result Value Ref Range   Glucose-Capillary 205 (H) 70 - 99 mg/dL    Comment: Glucose reference range applies only to samples taken after fasting for at least 8 hours.  POCT INR     Status: Abnormal   Collection Time: 09/27/22  3:25 PM  Result Value Ref Range   INR 1.1 (A) 2.0 - 3.0   POC INR    POCT INR     Status: None   Collection Time: 10/01/22  1:50 PM  Result Value Ref Range   INR     POC INR  2.4   POCT INR     Status: None   Collection Time: 10/06/22  3:50 PM  Result Value Ref Range   INR 2.6 2.0 - 3.0   POC INR    POCT INR     Status: Abnormal   Collection Time: 10/13/22  1:54 PM  Result Value Ref Range   INR 6.4 (A) 2.0 - 3.0   POC INR    Protime-INR     Status: Abnormal   Collection Time: 10/13/22  2:17 PM  Result Value Ref Range   INR 5.1 (HH) 0.9 - 1.2    Comment: **Result Repeated** Reference interval is for non-anticoagulated patients. Suggested INR therapeutic range for Vitamin K antagonist therapy:    Standard Dose (moderate intensity                   therapeutic range):       2.0 - 3.0    Higher intensity therapeutic range       2.5 - 3.5    Prothrombin Time 51.2 (H) 9.1 - 12.0 sec  POCT INR     Status: None   Collection Time: 10/20/22 11:17 AM  Result Value Ref Range   INR 2.2 2.0 - 3.0   POC INR    Basic metabolic panel     Status: Abnormal   Collection Time: 10/27/22 10:26 AM  Result Value Ref Range   Sodium 141 135 - 145 mmol/L   Potassium 4.7 3.5 - 5.1 mmol/L   Chloride 106 98 - 111 mmol/L   CO2 26 22 - 32 mmol/L    Glucose, Bld 122 (H) 70 - 99 mg/dL    Comment: Glucose reference range applies only to samples taken after fasting for at least 8 hours.   BUN 22 8 - 23 mg/dL   Creatinine, Ser 6.57 0.61 - 1.24 mg/dL   Calcium 9.2 8.9 - 84.6 mg/dL   GFR, Estimated >96 >29 mL/min    Comment: (NOTE) Calculated using the CKD-EPI Creatinine Equation (2021)    Anion gap 9 5 - 15    Comment: Performed at [facility], [address] [place], [place] [address]  Magnesium     Status: None   Collection Time: 10/27/22 10:26 AM  Result Value Ref Range   Magnesium 2.2 1.7 - 2.4 mg/dL    Comment: Performed at [facility], [address], [place], [place] [address]  POCT INR     Status: Abnormal   Collection Time: 10/27/22 11:06 AM  Result Value Ref Range   INR 1.5 (A) 2.0 - 3.0   POC INR    POCT INR     Status: Abnormal   Collection Time: 11/02/22 11:35 AM  Result Value Ref Range   INR 1.3 (A) 2.0 - 3.0   POC INR           has a past medical history of Aortic aneurysm (HCC) (02/20/2016), Arthritis, Atrial fibrillation (HCC) (11/13/2014), Cancer (HCC), Chronic combined systolic and diastolic heart failure (HCC) (10/31/2534), Coronary artery disease, Depression, Essential hypertension (11/13/2014), Hyperlipidemia, Hyperlipidemia (11/13/2014), Hypertension, Sleep apnea, and Wegener's granulomatosis (11/18/2017).   reports that he quit smoking about 38 years ago. His smoking use included cigarettes. He has never used smokeless tobacco.  Past Surgical History:  Procedure Laterality Date   CARDIOVERSION N/A 12/06/2014   Procedure: CARDIOVERSION;  Surgeon: Chilton Si, MD;  Location: [facility] ENDOSCOPY;  Service: Cardiovascular;  Laterality: N/A;   CARDIOVERSION N/A 05/26/2017  Procedure: CARDIOVERSION;  Surgeon: Chilton Si, MD;  Location: [facility] ENDOSCOPY;  Service: Cardiovascular;  Laterality: N/A;   CARDIOVERSION N/A 04/25/2019   Procedure: CARDIOVERSION;  Surgeon: Thurmon Fair, MD;   Location: [facility] ENDOSCOPY;  Service: Cardiovascular;  Laterality: N/A;   CARDIOVERSION N/A 02/26/2022   Procedure: CARDIOVERSION;  Surgeon: Chilton Si, MD;  Location: [facility] ENDOSCOPY;  Service: Cardiovascular;  Laterality: N/A;   CARDIOVERSION N/A 05/13/2022   Procedure: CARDIOVERSION;  Surgeon: Quintella Reichert, MD;  Location: [facility] INVASIVE CV LAB;  Service: Cardiovascular;  Laterality: N/A;   CARPAL TUNNEL RELEASE     CORONARY ANGIOPLASTY     multiple stents   CORONARY ARTERY BYPASS GRAFT  2006   CORONARY STENT INTERVENTION N/A 09/28/2021   Procedure: CORONARY STENT INTERVENTION;  Surgeon: Runell Gess, MD;  Location: [facility] INVASIVE CV LAB;  Service: Cardiovascular;  Laterality: N/A;  SVG-PDA   EYE SURGERY     right eye-  muscular repair   LEFT HEART CATH AND CORS/GRAFTS ANGIOGRAPHY N/A 09/28/2021   Procedure: LEFT HEART CATH AND CORS/GRAFTS ANGIOGRAPHY;  Surgeon: Runell Gess, MD;  Location: [facility] INVASIVE CV LAB;  Service: Cardiovascular;  Laterality: N/A;   TOTAL HIP ARTHROPLASTY  01/03/2012   Procedure: TOTAL HIP ARTHROPLASTY;  Surgeon: Jacki Cones, MD;  Location: [facility] ORS;  Service: Orthopedics;  Laterality: Right;    Allergies  Allergen Reactions   Farxiga [Dapagliflozin] Other (See Comments)    Lightheadedness Low blood pressure   Statins Other (See Comments)    Myalgias     Immunization History  Administered Date(s) Administered   Influenza, High Dose Seasonal PF 10/05/2022   Influenza-Unspecified 11/01/2018   PFIZER(Purple Top)SARS-COV-2 Vaccination 02/10/2019, 03/03/2019, 09/06/2019   Pfizer Covid-19 Vaccine Bivalent Booster 92yrs & up 09/14/2020   Pneumococcal Polysaccharide-23 01/04/2012    Family History  Problem Relation Age of Onset   Heart attack Mother    Other Father        odd stomach disorder   Stroke Maternal Grandmother    Heart attack Maternal Grandfather    Heart attack Paternal Grandmother    Heart attack Paternal Grandfather      Current  Outpatient Medications:    ALPRAZolam (XANAX) 0.5 MG tablet, Take 0.5 mg by mouth at bedtime. Takes at 11:00pm, Disp: , Rfl:    amLODipine (NORVASC) 2.5 MG tablet, Take 2.5 mg by mouth daily. Takes at 6:30am, Disp: , Rfl:    carvedilol (COREG) 25 MG tablet, Take 0.5 tablets (12.5 mg total) by mouth 2 (two) times daily., Disp: , Rfl:    CHELATED ZINC PO, Take 1 tablet by mouth daily., Disp: , Rfl:    Cholecalciferol (VITAMIN D-3 PO), Take 1 capsule by mouth daily., Disp: , Rfl:    clopidogrel (PLAVIX) 75 MG tablet, Take 75 mg by mouth daily., Disp: , Rfl:    Coenzyme Q10 (COQ10 PO), Take 1 capsule by mouth daily., Disp: , Rfl:    dofetilide (TIKOSYN) 500 MCG capsule, TAKE 1 CAPSULE BY MOUTH 2 TIMES DAILY., Disp: 180 capsule, Rfl: 1   Evolocumab (REPATHA SURECLICK) 140 MG/ML SOAJ, INJECT 140 MG INTO THE SKIN EVERY 14 (FOURTEEN) DAYS. INJECT 1 PEN INTO THE FATTY TISSUE SKIN EVERY 14 DAY, Disp: 6 mL, Rfl: 3   famotidine (PEPCID) 20 MG tablet, Take 1 tablet (20 mg total) by mouth 2 (two) times daily., Disp: , Rfl:    ferrous sulfate 325 (65 FE) MG EC tablet, Take 325 mg by mouth every Monday, Wednesday, and Friday., Disp: ,  Rfl:    furosemide (LASIX) 40 MG tablet, Take 1 tablet (40 mg total) by mouth daily as needed., Disp: 30 tablet, Rfl: 3   KLOR-CON M20 20 MEQ tablet, TAKE 2 TABLETS BY MOUTH DAILY (Patient taking differently: Take 20 mEq by mouth daily.), Disp: 180 tablet, Rfl: 1   levothyroxine (SYNTHROID, LEVOTHROID) 137 MCG tablet, Take 137 mcg by mouth daily before breakfast., Disp: , Rfl:    loratadine (CLARITIN) 10 MG tablet, Take 10 mg by mouth daily., Disp: , Rfl:    metFORMIN (GLUCOPHAGE) 500 MG tablet, Take 500 mg by mouth 2 (two) times daily., Disp: , Rfl: 12   Multiple Vitamins-Minerals (CENTRUM SILVER 50+MEN) TABS, Take 1 tablet by mouth daily., Disp: , Rfl:    nitroGLYCERIN (NITROSTAT) 0.4 MG SL tablet, Place 1 tablet (0.4 mg total) under the tongue every 5 (five) minutes x 3 doses as  needed for chest pain., Disp: 25 tablet, Rfl: 12   pantoprazole (PROTONIX) 40 MG tablet, Take 40 mg by mouth daily., Disp: , Rfl:    prednisoLONE 5 MG TABS tablet, Tapered dose 20 mg daily, Disp: , Rfl:    riTUXimab (RITUXAN IV), Inject 1 application  into the vein See admin instructions. Take every two weeks and every six months alternating, Disp: , Rfl:    Semaglutide (OZEMPIC, 0.25 OR 0.5 MG/DOSE, [place]), Inject 0.5 mg into the skin once a week., Disp: , Rfl:    testosterone cypionate (DEPOTESTOSTERONE CYPIONATE) 200 MG/ML injection, Inject 150 mg into the muscle every 14 (fourteen) days., Disp: , Rfl:    venlafaxine XR (EFFEXOR-XR) 150 MG 24 hr capsule, Take 150 mg by mouth daily., Disp: , Rfl:    warfarin (COUMADIN) 5 MG tablet, TAKE 1/2 TABLET TO 1 TABLET BY MOUTH DAILY AS DIRECTED BY COUMADIN CLINIC (Patient taking differently: Take 2.5-5 mg by mouth See admin instructions. Take 2.5 mg once daily on Monday, Wednesday, Friday. Take 5 mg all other days.), Disp: 90 tablet, Rfl: 0      Objective:   Vitals:   11/04/22 1405  BP: 128/80  Pulse: 60  SpO2: 97%  Weight: 220 lb 9.6 oz (100.1 kg)  Height: 5\' 10"  (1.778 m)    Estimated body mass index is 31.65 kg/m as calculated from the following:   Height as of this encounter: 5\' 10"  (1.778 m).   Weight as of this encounter: 220 lb 9.6 oz (100.1 kg).  @WEIGHTCHANGE @  [employer]   11/04/22 1405  Weight: 220 lb 9.6 oz (100.1 kg)     Physical Exam   General: No distress. Looks mildy cushingoid O2 at rest: no Cane present: no Sitting in wheel chair: no Frail: no Obese: no Neuro: Alert and Oriented x 3. GCS 15. Speech normal Psych: Pleasant Resp:  Barrel Chest - no.  Wheeze - no, Crackles - yes, No overt respiratory distress CVS: Normal heart sounds. Murmurs - no Ext: Stigmata of Connective Tissue Disease - no HEENT: Normal upper airway. PEERL +. No post nasal drip        Assessment:       ICD-10-CM   1. History of  acute respiratory failure  Z87.09     2. 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 ILD (interstitial lung disease) (HCC)  J84.9     4. History of diastolic dysfunction  Z86.79     5. High risk medication use  Z79.899      Need to rule out opportunistic infection  and then consider anti-fibrotic.     Plan:     Patient Instructions     ICD-10-CM   1. History of acute respiratory failure  Z87.09     2. 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 ILD (interstitial lung disease) (HCC)  J84.9     4. History of diastolic dysfunction  Z86.79     In 2008 you had clean lungs on the CT lung field.  However as of February 2024 he developed pulmonary inflammation in the background of Wegener's for the last 5 years and Rituxan therapy for the last 5 years resulting in hospitalization September 2024 with respiratory failure.  Currently you are improved in the lungs might be transitioning from inflammation to fibrosis  Plan -In order to decide the next phase and treatment options I recommend the following  -Keep your high-resolution CT chest appointment for 11/05/2022 but we will make sure there is supine and prone and also inspiratory and expiratory volume   -Do full pulmonary function test  -We will schedule bronchoscopy with lavage sometime in the next 2-8 weeks  - check blood work for BNP, QUantiferon gold, G6PD  -get updated ECHO next few to several weeks (last in feb 2024)  -Meanwhile  - continue oxygen therapy at night and with exertion to keep your pulse ox greater than 88% - continue prednisone taper outline prior - [name] - continue rituxan per [name]   Follow-up - 6-8 weeks 30-minute visit but after completing all of the above     FOLLOWUP Return in about 7 weeks (around 12/23/2022) for 30 min visit, after Cleda Daub and DLCO, ILD, Face to Face Visit.  ( Level 05 visit E&M 2024: Estb >= 40 min   in  visit type:  on-site physical face to visit  in total care time and counseling or/and coordination of care by this undersigned MD - Dr Kalman Shan. This includes one or more of the following on this same day 11/04/2022: pre-charting, chart review, note writing, documentation discussion of test results, diagnostic or treatment recommendations, prognosis, risks and benefits of management options, instructions, education, compliance or risk-factor reduction. It excludes time spent by the CMA or office staff in the care of the patient. Actual time 50 min)   SIGNATURE    Dr. Kalman Shan, M.D., F.C.C.P,  Pulmonary and Critical Care Medicine Staff Physician, [facility] Director - Interstitial Lung Disease  Program  Pulmonary Fibrosis [facility] at [facility] [place], [place], [address]  Pager: [phone], If no answer or between  15:00h - 7:00h: call [phone] Telephone: [phone]  3:07 PM 11/04/2022

## 2022-11-04 NOTE — Patient Instructions (Addendum)
ICD-10-CM   1. History of acute respiratory failure  Z87.09     2. 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 ILD (interstitial lung disease) (HCC)  J84.9     4. History of diastolic dysfunction  Z86.79     In 2008 you had clean lungs on the CT lung field.  However as of February 2024 he developed pulmonary inflammation in the background of Wegener's for the last 5 years and Rituxan therapy for the last 5 years resulting in hospitalization September 2024 with respiratory failure.  Currently you are improved in the lungs might be transitioning from inflammation to fibrosis  Plan -In order to decide the next phase and treatment options I recommend the following  -Keep your high-resolution CT chest appointment for 11/05/2022 but we will make sure there is supine and prone and also inspiratory and expiratory volume   -Do full pulmonary function test  -We will schedule bronchoscopy with lavage sometime in the next 2-8 weeks  - check blood work for BNP, QUantiferon gold, G6PD  -get updated ECHO next few to several weeks (last in feb 2024)  -Meanwhile  - continue oxygen therapy at night and with exertion to keep your pulse ox greater than 88% - continue prednisone taper outline prior - [name] - continue rituxan per [name]   Follow-up - 6-8 weeks 30-minute visit but after completing all of the above

## 2022-11-04 NOTE — Telephone Encounter (Signed)
  Bnp high - deifnitely need to get echo and he should mention this to his cardiologist   Latest Reference Range & Units 11/04/22 15:19  Pro B Natriuretic peptide (BNP) 0.0 - 100.0 pg/mL 416.0 (H)  (H): Data is ab  Latest Reference Range & Units 11/13/14 09:18 05/03/17 04:20 03/01/22 09:45 09/17/22 14:48 11/04/22 15:19  B Natriuretic Peptide 0.0 - 100.0 pg/mL  350.9 (H) 471.9 (H) 322.1 (H)   Brain Natriuretic Peptide 0.0 - 100.0 pg/mL 242.6 (H)      Pro B Natriuretic peptide (BNP) 0.0 - 100.0 pg/mL     416.0 (H)  (H): Data is abnormally highnormally high

## 2022-11-05 ENCOUNTER — Ambulatory Visit
Admission: RE | Admit: 2022-11-05 | Discharge: 2022-11-05 | Disposition: A | Discharge status: Home / Self Care | Payer: Medicare Other | Source: Ambulatory Visit | Attending: Internal Medicine

## 2022-11-05 DIAGNOSIS — R0602 Shortness of breath: Secondary | ICD-10-CM | POA: Diagnosis not present

## 2022-11-05 DIAGNOSIS — J849 Interstitial pulmonary disease, unspecified: Secondary | ICD-10-CM

## 2022-11-05 DIAGNOSIS — J439 Emphysema, unspecified: Secondary | ICD-10-CM | POA: Diagnosis not present

## 2022-11-05 DIAGNOSIS — I700 Atherosclerosis of aorta: Secondary | ICD-10-CM | POA: Diagnosis not present

## 2022-11-05 DIAGNOSIS — J8410 Pulmonary fibrosis, unspecified: Secondary | ICD-10-CM | POA: Diagnosis not present

## 2022-11-05 NOTE — Telephone Encounter (Signed)
Printed to work on

## 2022-11-08 ENCOUNTER — Encounter: Payer: Self-pay | Admitting: Internal Medicine

## 2022-11-08 NOTE — Telephone Encounter (Signed)
Patient called back and I gave them appt info

## 2022-11-08 NOTE — Telephone Encounter (Signed)
Left message for patient to call me back about appt info

## 2022-11-09 ENCOUNTER — Ambulatory Visit: Payer: Medicare Other | Attending: Cardiovascular Disease

## 2022-11-09 DIAGNOSIS — Z7901 Long term (current) use of anticoagulants: Secondary | ICD-10-CM | POA: Insufficient documentation

## 2022-11-09 DIAGNOSIS — I480 Paroxysmal atrial fibrillation: Secondary | ICD-10-CM | POA: Diagnosis not present

## 2022-11-09 LAB — POCT INR: INR: 3.3 — AB (ref 2.0–3.0)

## 2022-11-09 NOTE — Patient Instructions (Signed)
CONTINUE TAKING 0.5 TABLET DAILY, EXCEPT 1 TABLET SUNDAY, TUESDAY and THURSDAY.  Prednisone until 11/17 Recheck INR in 2 weeks.  Report to ER with any bleeding, falls, accidents.  Please call us at [phone] with any questions.

## 2022-11-10 DIAGNOSIS — M0579 Rheumatoid arthritis with rheumatoid factor of multiple sites without organ or systems involvement: Secondary | ICD-10-CM | POA: Diagnosis not present

## 2022-11-10 DIAGNOSIS — R5383 Other fatigue: Secondary | ICD-10-CM | POA: Diagnosis not present

## 2022-11-10 DIAGNOSIS — R350 Frequency of micturition: Secondary | ICD-10-CM | POA: Diagnosis not present

## 2022-11-10 DIAGNOSIS — M1991 Primary osteoarthritis, unspecified site: Secondary | ICD-10-CM | POA: Diagnosis not present

## 2022-11-10 DIAGNOSIS — R0609 Other forms of dyspnea: Secondary | ICD-10-CM | POA: Diagnosis not present

## 2022-11-10 DIAGNOSIS — M6281 Muscle weakness (generalized): Secondary | ICD-10-CM | POA: Diagnosis not present

## 2022-11-10 DIAGNOSIS — M25561 Pain in right knee: Secondary | ICD-10-CM | POA: Diagnosis not present

## 2022-11-10 DIAGNOSIS — M3130 Wegener's granulomatosis without renal involvement: Secondary | ICD-10-CM | POA: Diagnosis not present

## 2022-11-10 DIAGNOSIS — E669 Obesity, unspecified: Secondary | ICD-10-CM | POA: Diagnosis not present

## 2022-11-10 DIAGNOSIS — Z6831 Body mass index (BMI) 31.0-31.9, adult: Secondary | ICD-10-CM | POA: Diagnosis not present

## 2022-11-10 DIAGNOSIS — Z7952 Long term (current) use of systemic steroids: Secondary | ICD-10-CM | POA: Diagnosis not present

## 2022-11-10 DIAGNOSIS — M25562 Pain in left knee: Secondary | ICD-10-CM | POA: Diagnosis not present

## 2022-11-11 ENCOUNTER — Other Ambulatory Visit: Payer: Self-pay | Admitting: Cardiovascular Disease

## 2022-11-11 DIAGNOSIS — I480 Paroxysmal atrial fibrillation: Secondary | ICD-10-CM

## 2022-11-11 DIAGNOSIS — Z7901 Long term (current) use of anticoagulants: Secondary | ICD-10-CM

## 2022-11-11 LAB — QUANTIFERON-TB GOLD PLUS
Mitogen-NIL: 5.23 [IU]/mL
NIL: 0.03 [IU]/mL
QuantiFERON-TB Gold Plus: NEGATIVE
TB1-NIL: 0 [IU]/mL
TB2-NIL: 0 [IU]/mL

## 2022-11-11 LAB — GLUCOSE 6 PHOSPHATE DEHYDROGENASE: G-6PDH: 21.1 U/g{Hb} — ABNORMAL HIGH (ref 7.0–20.5)

## 2022-11-11 NOTE — Telephone Encounter (Signed)
Warfarin 5mg  refill Afib Last INR 11/09/22 Last OV 10/27/22-[facility]

## 2022-11-11 NOTE — Telephone Encounter (Signed)
Warfarin refill ?

## 2022-11-15 ENCOUNTER — Ambulatory Visit (HOSPITAL_BASED_OUTPATIENT_CLINIC_OR_DEPARTMENT_OTHER): Payer: Medicare Other | Admitting: Internal Medicine

## 2022-11-15 DIAGNOSIS — R0600 Dyspnea, unspecified: Secondary | ICD-10-CM

## 2022-11-15 DIAGNOSIS — M3130 Wegener's granulomatosis without renal involvement: Secondary | ICD-10-CM

## 2022-11-15 DIAGNOSIS — J849 Interstitial pulmonary disease, unspecified: Secondary | ICD-10-CM

## 2022-11-15 LAB — PULMONARY FUNCTION TEST
DL/VA % pred: 100 %
DL/VA: 3.97 ml/min/mmHg/L
DLCO cor % pred: 65 %
DLCO cor: 15.33 ml/min/mmHg
DLCO unc % pred: 58 %
DLCO unc: 13.57 ml/min/mmHg
FEF 25-75 Post: 3.97 L/s
FEF 25-75 Pre: 2.97 L/s
FEF2575-%Change-Post: 33 %
FEF2575-%Pred-Post: 213 %
FEF2575-%Pred-Pre: 159 %
FEV1-%Change-Post: 7 %
FEV1-%Pred-Post: 88 %
FEV1-%Pred-Pre: 82 %
FEV1-Post: 2.38 L
FEV1-Pre: 2.22 L
FEV1FVC-%Change-Post: 0 %
FEV1FVC-%Pred-Pre: 120 %
FEV6-%Change-Post: 7 %
FEV6-%Pred-Post: 76 %
FEV6-%Pred-Pre: 71 %
FEV6-Post: 2.71 L
FEV6-Pre: 2.51 L
FEV6FVC-%Pred-Post: 107 %
FEV6FVC-%Pred-Pre: 107 %
FVC-%Change-Post: 6 %
FVC-%Pred-Post: 72 %
FVC-%Pred-Pre: 67 %
FVC-Post: 2.74 L
FVC-Pre: 2.57 L
Post FEV1/FVC ratio: 87 %
Post FEV6/FVC ratio: 100 %
Pre FEV1/FVC ratio: 86 %
Pre FEV6/FVC Ratio: 100 %
RV % pred: 45 %
RV: 1.16 L
TLC % pred: 59 %
TLC: 4.01 L

## 2022-11-15 NOTE — Progress Notes (Signed)
Full PFT Performed Today  

## 2022-11-15 NOTE — Patient Instructions (Signed)
Full PFT Performed Today  

## 2022-11-17 ENCOUNTER — Encounter (HOSPITAL_BASED_OUTPATIENT_CLINIC_OR_DEPARTMENT_OTHER): Payer: Self-pay | Admitting: Cardiovascular Disease

## 2022-11-17 DIAGNOSIS — D509 Iron deficiency anemia, unspecified: Secondary | ICD-10-CM | POA: Diagnosis not present

## 2022-11-17 DIAGNOSIS — E1169 Type 2 diabetes mellitus with other specified complication: Secondary | ICD-10-CM | POA: Diagnosis not present

## 2022-11-17 DIAGNOSIS — I5022 Chronic systolic (congestive) heart failure: Secondary | ICD-10-CM | POA: Diagnosis not present

## 2022-11-17 DIAGNOSIS — I10 Essential (primary) hypertension: Secondary | ICD-10-CM | POA: Diagnosis not present

## 2022-11-17 DIAGNOSIS — M3130 Wegener's granulomatosis without renal involvement: Secondary | ICD-10-CM | POA: Diagnosis not present

## 2022-11-17 DIAGNOSIS — R5383 Other fatigue: Secondary | ICD-10-CM | POA: Diagnosis not present

## 2022-11-17 DIAGNOSIS — E039 Hypothyroidism, unspecified: Secondary | ICD-10-CM | POA: Diagnosis not present

## 2022-11-17 DIAGNOSIS — D849 Immunodeficiency, unspecified: Secondary | ICD-10-CM | POA: Diagnosis not present

## 2022-11-17 DIAGNOSIS — J849 Interstitial pulmonary disease, unspecified: Secondary | ICD-10-CM | POA: Diagnosis not present

## 2022-11-17 DIAGNOSIS — R351 Nocturia: Secondary | ICD-10-CM | POA: Diagnosis not present

## 2022-11-17 DIAGNOSIS — J9601 Acute respiratory failure with hypoxia: Secondary | ICD-10-CM | POA: Diagnosis not present

## 2022-11-17 DIAGNOSIS — I719 Aortic aneurysm of unspecified site, without rupture: Secondary | ICD-10-CM | POA: Diagnosis not present

## 2022-11-17 DIAGNOSIS — M609 Myositis, unspecified: Secondary | ICD-10-CM | POA: Diagnosis not present

## 2022-11-17 DIAGNOSIS — D6869 Other thrombophilia: Secondary | ICD-10-CM | POA: Diagnosis not present

## 2022-11-17 DIAGNOSIS — Z7952 Long term (current) use of systemic steroids: Secondary | ICD-10-CM | POA: Diagnosis not present

## 2022-11-17 DIAGNOSIS — E785 Hyperlipidemia, unspecified: Secondary | ICD-10-CM | POA: Diagnosis not present

## 2022-11-17 DIAGNOSIS — I25118 Atherosclerotic heart disease of native coronary artery with other forms of angina pectoris: Secondary | ICD-10-CM | POA: Diagnosis not present

## 2022-11-17 NOTE — Telephone Encounter (Signed)
Pt had a echo 10/31, pt was called and verbalized understanding. Nfn closing encounter

## 2022-11-22 ENCOUNTER — Ambulatory Visit: Payer: Self-pay

## 2022-11-22 NOTE — Patient Instructions (Signed)
Visit Information  Thank you for taking time to visit with me today. Please don't hesitate to contact me if I can be of assistance to you.   Following are the goals we discussed today:   Goals Addressed             This Visit's Progress    To discuss my lung status with my PCP       Care Coordination Interventions: Evaluation of current treatment plan related to  Wegener's granulomatosis without renal involvement, dyspnea, Interstitial Lung Disease and patient's adherence to plan as established by provider Discussed with patient he completed his scheduled Pulmonology consultation with with [name] scheduled on 11/04/22 Review of patient status, including review of consultant's reports, relevant laboratory and other test results, and medications completed Plan -In order to decide the next phase and treatment options I recommend the following             -Keep your high-resolution CT chest appointment for 11/05/2022 but we will make sure there is supine and prone and also inspiratory and expiratory volume              -Do full pulmonary function test             -We will schedule bronchoscopy with lavage sometime in the next 2-8 weeks             - check blood work for BNP, QUantiferon gold, G6PD             -get updated ECHO next few to several weeks (last in feb 2024) -Meanwhile  - continue oxygen therapy at night and with exertion to keep your pulse ox greater than 88% - continue prednisone taper outline prior - [name] - continue rituxan per [name] Follow-up - 6-8 weeks 30-minute visit but after completing all of the above Patient will contact [facility], [address] location to schedule his Echocardiogram if he does not receive a call from this provider Educated patient about the bronchoscopy with lavage and what to expect during the procedure Patient will keep his appointment to have his bronchoscopy at [facility] on 12/14/22 Discussed plans  with patient for ongoing care coordination follow up and provided patient with direct contact information for nurse care coordinator        Our next appointment is by telephone on 12/06/22 at 1:00 PM  Please call the care guide team at [phone] if you need to cancel or reschedule your appointment.   If you are experiencing a Mental Health or Behavioral Health Crisis or need someone to talk to, please call 1-800-273-TALK (toll free, 24 hour hotline)  Patient verbalizes understanding of instructions and care plan provided today and agrees to view in MyChart. Active MyChart status and patient understanding of how to access instructions and care plan via MyChart confirmed with patient.     Delsa Sale RN BSN CCM [place]  [facility], [facility] Nurse Care Coordinator  Direct Dial: [phone] Website: [email or web address]

## 2022-11-22 NOTE — Patient Outreach (Signed)
Care Coordination   Follow Up Visit Note   11/22/2022 Name: Gary [name] MRN: [id number] DOB: 1945/04/14  Gary [name] is a 77 y.o. year old male who sees Gary [name], [place] for primary care. I spoke with  Gary [name] by phone today.  What matters to the patients health and wellness today?  Patient would like to complete his scheduled bronchoscopy without complications.     Goals Addressed             This Visit's Progress    To discuss my lung status with my PCP       Care Coordination Interventions: Evaluation of current treatment plan related to  Wegener's granulomatosis without renal involvement, dyspnea, Interstitial Lung Disease and patient's adherence to plan as established by provider Discussed with patient he completed his scheduled Pulmonology consultation with with [name] scheduled on 11/04/22 Review of patient status, including review of consultant's reports, relevant laboratory and other test results, and medications completed Plan -In order to decide the next phase and treatment options I recommend the following             -Keep your high-resolution CT chest appointment for 11/05/2022 but we will make sure there is supine and prone and also inspiratory and expiratory volume              -Do full pulmonary function test             -We will schedule bronchoscopy with lavage sometime in the next 2-8 weeks             - check blood work for BNP, QUantiferon gold, G6PD             -get updated ECHO next few to several weeks (last in feb 2024) -Meanwhile  - continue oxygen therapy at night and with exertion to keep your pulse ox greater than 88% - continue prednisone taper outline prior - [name] - continue rituxan per [name] Follow-up - 6-8 weeks 30-minute visit but after completing all of the above Patient will contact [facility], [address] location to schedule his Echocardiogram if he does not receive a call from this  provider Educated patient about the bronchoscopy with lavage and what to expect during the procedure Patient will keep his appointment to have his bronchoscopy at [facility] on 12/14/22 Discussed plans with patient for ongoing care coordination follow up and provided patient with direct contact information for nurse care coordinator    Interventions Today    Flowsheet Row Most Recent Value  Chronic Disease   Chronic disease during today's visit Other  [Wegener's granulomatosis without renal involvement, ILD (interstitial lung disease), Dyspnea, unspecified type]  General Interventions   General Interventions Discussed/Reviewed General Interventions Reviewed, General Interventions Discussed, Doctor Visits, Durable Medical Equipment (DME)  Doctor Visits Discussed/Reviewed Doctor Visits Discussed, Doctor Visits Reviewed, Specialist  Durable Medical Equipment (DME) Oxygen  Exercise Interventions   Exercise Discussed/Reviewed Physical Activity, Exercise Reviewed, Exercise Discussed  Physical Activity Discussed/Reviewed Physical Activity Reviewed, Physical Activity Discussed  Education Interventions   Education Provided Provided Education  Provided Verbal Education On Medication, When to see the doctor  Pharmacy Interventions   Pharmacy Dicussed/Reviewed Pharmacy Topics Reviewed, Pharmacy Topics Discussed          SDOH assessments and interventions completed:  No     Care Coordination Interventions:  Yes, provided   Follow up plan: Follow up call scheduled for 12/06/22 @1 :00 PM  Encounter Outcome:  Patient Visit Completed

## 2022-11-23 ENCOUNTER — Ambulatory Visit: Payer: Medicare Other | Attending: Cardiology

## 2022-11-23 DIAGNOSIS — Z7901 Long term (current) use of anticoagulants: Secondary | ICD-10-CM | POA: Diagnosis not present

## 2022-11-23 DIAGNOSIS — I480 Paroxysmal atrial fibrillation: Secondary | ICD-10-CM | POA: Insufficient documentation

## 2022-11-23 LAB — POCT INR: INR: 2.6 (ref 2.0–3.0)

## 2022-11-23 NOTE — Patient Instructions (Signed)
CONTINUE TAKING 1 tablet Daily, except 0.5 tablet on Monday, Wednesday and Friday. Recheck INR in 4 weeks.  Report to ER with any bleeding, falls, accidents.  Please call us at [phone] with any questions

## 2022-11-30 DIAGNOSIS — E669 Obesity, unspecified: Secondary | ICD-10-CM | POA: Diagnosis not present

## 2022-11-30 DIAGNOSIS — M3130 Wegener's granulomatosis without renal involvement: Secondary | ICD-10-CM | POA: Diagnosis not present

## 2022-11-30 DIAGNOSIS — M1991 Primary osteoarthritis, unspecified site: Secondary | ICD-10-CM | POA: Diagnosis not present

## 2022-11-30 DIAGNOSIS — R0609 Other forms of dyspnea: Secondary | ICD-10-CM | POA: Diagnosis not present

## 2022-11-30 DIAGNOSIS — M0579 Rheumatoid arthritis with rheumatoid factor of multiple sites without organ or systems involvement: Secondary | ICD-10-CM | POA: Diagnosis not present

## 2022-11-30 DIAGNOSIS — Z7952 Long term (current) use of systemic steroids: Secondary | ICD-10-CM | POA: Diagnosis not present

## 2022-11-30 DIAGNOSIS — R5383 Other fatigue: Secondary | ICD-10-CM | POA: Diagnosis not present

## 2022-11-30 DIAGNOSIS — M6281 Muscle weakness (generalized): Secondary | ICD-10-CM | POA: Diagnosis not present

## 2022-11-30 DIAGNOSIS — M25562 Pain in left knee: Secondary | ICD-10-CM | POA: Diagnosis not present

## 2022-11-30 DIAGNOSIS — Z6831 Body mass index (BMI) 31.0-31.9, adult: Secondary | ICD-10-CM | POA: Diagnosis not present

## 2022-11-30 DIAGNOSIS — M25561 Pain in right knee: Secondary | ICD-10-CM | POA: Diagnosis not present

## 2022-12-06 ENCOUNTER — Ambulatory Visit: Payer: Self-pay

## 2022-12-06 ENCOUNTER — Telehealth (HOSPITAL_BASED_OUTPATIENT_CLINIC_OR_DEPARTMENT_OTHER): Payer: Self-pay | Admitting: Cardiovascular Disease

## 2022-12-06 NOTE — Telephone Encounter (Signed)
Patient wants a provider switch from Dr. Duke Salvia to Dr. Allyson Sabal and stated he had seen Dr. Allyson Sabal previously.

## 2022-12-07 ENCOUNTER — Ambulatory Visit (HOSPITAL_COMMUNITY): Payer: Medicare Other | Attending: Cardiology

## 2022-12-07 ENCOUNTER — Other Ambulatory Visit: Payer: Self-pay | Admitting: Internal Medicine

## 2022-12-07 DIAGNOSIS — J849 Interstitial pulmonary disease, unspecified: Secondary | ICD-10-CM

## 2022-12-07 DIAGNOSIS — R0600 Dyspnea, unspecified: Secondary | ICD-10-CM | POA: Insufficient documentation

## 2022-12-07 DIAGNOSIS — M3130 Wegener's granulomatosis without renal involvement: Secondary | ICD-10-CM | POA: Diagnosis not present

## 2022-12-07 LAB — ECHOCARDIOGRAM COMPLETE
AR max vel: 2.48 cm2
AV Area VTI: 2.51 cm2
AV Area mean vel: 2.44 cm2
AV Mean grad: 7 mm[Hg]
AV Peak grad: 13 mm[Hg]
Ao pk vel: 1.8 m/s
Area-P 1/2: 3.47 cm2
MV VTI: 2.87 cm2
S' Lateral: 3.6 cm

## 2022-12-07 NOTE — Patient Outreach (Signed)
  Care Coordination   Follow Up Visit Note   12/06/2022 Name: Gary [name] MRN: [id number] DOB: [birth year]  Gary [name] is a 77 y.o. year old male who sees Charlane Ferretti, [place] for primary care. I spoke with  [name] by phone today.  What matters to the patients health and wellness today?  Patient would like to have his Echocardiogram scheduled as directed.     Goals Addressed             This Visit's Progress    To discuss my lung status with my PCP   On track    Care Coordination Interventions: Evaluation of current treatment plan related to  Wegener's granulomatosis without renal involvement, dyspnea, Interstitial Lung Disease and patient's adherence to plan as established by provider Discussed with patient he did not receive a call from [facility] for his Echo scheduling Placed joint call to [facility] with patient, determined the referral for patient's Echo placed by [name] was inadvertently closed Sent in basket message to Vernie Murders CMA for [name] requesting a new referral be placed Reply received from [facility] advising a new referral for patient's Echocardiogram has been placed in EPIC Discussed plans with patient for ongoing care coordination follow up and provided patient with direct contact information for nurse care coordinator    Interventions Today    Flowsheet Row Most Recent Value  Chronic Disease   Chronic disease during today's visit Other  [Wegener's granulomatosis without renal involvement, ILD (interstitial lung disease), Dyspnea, unspecified type]  General Interventions   General Interventions Discussed/Reviewed Durable Medical Equipment (DME), Doctor Visits, Communication with  Doctor Visits Discussed/Reviewed Doctor Visits Discussed, Doctor Visits Reviewed, Specialist  Durable Medical Equipment (DME) Oxygen  Communication with PCP/Specialists  [facility],  Vernie Murders CMA, [name]   Exercise Interventions   Exercise Discussed/Reviewed Physical Activity  Physical Activity Discussed/Reviewed Physical Activity Reviewed, Physical Activity Discussed  Education Interventions   Education Provided Provided Education  Provided Verbal Education On When to see the doctor  Pharmacy Interventions   Pharmacy Dicussed/Reviewed Pharmacy Topics Discussed, Pharmacy Topics Reviewed          SDOH assessments and interventions completed:  No     Care Coordination Interventions:  Yes, provided   Follow up plan: Follow up call scheduled for 12/23/22 @09 :00 AM    Encounter Outcome:  Patient Visit Completed

## 2022-12-07 NOTE — Telephone Encounter (Signed)
Bronchoscopy scheduled for 12/14/2022

## 2022-12-07 NOTE — Patient Instructions (Signed)
Visit Information  Thank you for taking time to visit with me today. Please don't hesitate to contact me if I can be of assistance to you.   Following are the goals we discussed today:   Goals Addressed             This Visit's Progress    To discuss my lung status with my PCP   On track    Care Coordination Interventions: Evaluation of current treatment plan related to  Wegener's granulomatosis without renal involvement, dyspnea, Interstitial Lung Disease and patient's adherence to plan as established by provider Discussed with patient he did not receive a call from [facility] for his Echo scheduling Placed joint call to [facility] with patient, determined the referral for patient's Echo placed by [name] was inadvertently closed Sent in basket message to Vernie Murders CMA for [name] requesting a new referral be placed Reply received from [facility] advising a new referral for patient's Echocardiogram has been placed in EPIC Discussed plans with patient for ongoing care coordination follow up and provided patient with direct contact information for nurse care coordinator        Our next appointment is by telephone on 12/23/22 at 09:00 AM  Please call the care guide team at [phone] if you need to cancel or reschedule your appointment.   If you are experiencing a Mental Health or Behavioral Health Crisis or need someone to talk to, please call 1-800-273-TALK (toll free, 24 hour hotline)  Patient verbalizes understanding of instructions and care plan provided today and agrees to view in MyChart. Active MyChart status and patient understanding of how to access instructions and care plan via MyChart confirmed with patient.     Delsa Sale RN BSN CCM [place]  [facility], [facility] Nurse Care Coordinator  Direct Dial: [phone] Website: [email or web address]

## 2022-12-08 ENCOUNTER — Telehealth: Payer: Self-pay | Admitting: Internal Medicine

## 2022-12-08 ENCOUNTER — Encounter (HOSPITAL_BASED_OUTPATIENT_CLINIC_OR_DEPARTMENT_OTHER): Payer: Self-pay

## 2022-12-08 ENCOUNTER — Encounter (HOSPITAL_COMMUNITY): Payer: Self-pay | Admitting: Internal Medicine

## 2022-12-08 ENCOUNTER — Encounter (HOSPITAL_BASED_OUTPATIENT_CLINIC_OR_DEPARTMENT_OTHER): Payer: Self-pay | Admitting: *Deleted

## 2022-12-08 NOTE — Telephone Encounter (Signed)
I have routed letter via mychart to Dr Duke Salvia asking for cardiac clearance  Gary [name] at the number provided and there was no answer- left detailed msg on her voicemail letter her know this is in progress

## 2022-12-08 NOTE — Telephone Encounter (Signed)
Approved by both providers Patient scheduled to see Dr Allyson Sabal 3/24  Mychart message to patient

## 2022-12-08 NOTE — Telephone Encounter (Signed)
Gary [name] states patient needs cardiac clearance for procedure 12/14/2022. Gary [name] phone number is [phone].

## 2022-12-08 NOTE — Progress Notes (Signed)
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 Patient last saw cardiologist 10/23 and has f/u 12/16, endorses no new cardiac issues. Last saw pulm in october, is on 2L02 currently. Had cardioversion in feb where ozempic wasnt held (didnt want to delay CV) so did develop PNA from what was assumed aspiration and admitted to hospital 2/26. That is the only issue with anesthesia pt reports.  Anesthesia Review: Yes

## 2022-12-09 NOTE — Progress Notes (Signed)
Echo with likely pulmonary hypertensioin. Will discuss during OV

## 2022-12-13 ENCOUNTER — Ambulatory Visit: Payer: Medicare Other | Attending: Student

## 2022-12-13 ENCOUNTER — Encounter (HOSPITAL_BASED_OUTPATIENT_CLINIC_OR_DEPARTMENT_OTHER): Payer: Self-pay | Admitting: Cardiovascular Disease

## 2022-12-13 ENCOUNTER — Telehealth: Payer: Self-pay | Admitting: *Deleted

## 2022-12-13 DIAGNOSIS — Z01810 Encounter for preprocedural cardiovascular examination: Secondary | ICD-10-CM

## 2022-12-13 NOTE — Telephone Encounter (Signed)
Phone [phone]  Fax [phone]

## 2022-12-13 NOTE — Telephone Encounter (Signed)
   Pre-operative Risk Assessment    Patient Name: MINA METCALF  DOB: 1945-05-30 MRN: [id number]  DATE OF LAST VISIT: 09/24/22 Gillian Shields, NP DATE OF NEXT VISIT: NONE  PT HAS APPT WITH COUMADIN CLINIC ALREADY 12/20/22    Request for Surgical Clearance    Procedure:  VIDEO BRONCHOSCOPY W/O FLUORO  Date of Surgery:  Clearance 12/14/22                                 Surgeon:  [name] Surgeon's Group or Practice Name:  [facility] Phone number:   Fax number:     Type of Clearance Requested:   - Medical  - Pharmacy:  Hold Clopidogrel (Plavix) and Warfarin (Coumadin)     Type of Anesthesia:  General    Additional requests/questions:    Elpidio Anis   12/13/2022, 4:01 PM

## 2022-12-13 NOTE — Telephone Encounter (Signed)
Of note, there is pulmonology note from 12/08/22 stating clearance was needed but not sent to Preop team per office protocols. It was sent correctly and addressed same day, today. Pulmonology team has been updated regarding process to send clearance requests.   Message printed for Dr. Duke Salvia review.   Alver Sorrow

## 2022-12-13 NOTE — Telephone Encounter (Signed)
Patient with diagnosis of afib on warfarin for anticoagulation.    Procedure: VIDEO BRONCHOSCOPY W/O FLUORO  Date of procedure: 12/14/22   CHA2DS2-VASc Score = 6   This indicates a 9.7% annual risk of stroke. The patient's score is based upon: CHF History: 1 HTN History: 1 Diabetes History: 1 Stroke History: 0 Vascular Disease History: 1 Age Score: 2 Gender Score: 0      Per office protocol, patient can hold warfarin for 5 days prior to procedure.    Patient will NOT need bridging with Lovenox (enoxaparin) around procedure.  **This guidance is not considered finalized until pre-operative APP has relayed final recommendations.**

## 2022-12-13 NOTE — Telephone Encounter (Signed)
Please advise holding Coumadin prior to bronchoscopy 12/14/2022.  Thank you!  DW

## 2022-12-13 NOTE — Telephone Encounter (Signed)
Dr Duke Salvia called and spoke with patient

## 2022-12-13 NOTE — Progress Notes (Signed)
Virtual Visit via Telephone Note   Because of Jaycub Skillern Martensen's co-morbid illnesses, he is at least at moderate risk for complications without adequate follow up.  This format is felt to be most appropriate for this patient at this time.  The patient did not have access to video technology/had technical difficulties with video requiring transitioning to audio format only (telephone).  All issues noted in this document were discussed and addressed.  No physical exam could be performed with this format.  Please refer to the patient's chart for his consent to telehealth for [facility].  Evaluation Performed:  Preoperative cardiovascular risk assessment _____________   Date:  12/13/2022   Patient ID:  DERRINGER NEWCOME, DOB 1945-06-09, MRN [id number] Patient Location:  Home Provider location:   Office  Primary Care Provider:  Charlane Ferretti, DO Primary Cardiologist:  Chilton Si, MD  Chief Complaint / Patient Profile   77 y.o. y/o male with a h/o CAD s/p PCI with stent to RCA 2003, stents to RCA and LAD 2004, stents to proximal/mid/distal LAD 2005, CABG 2006, stent to SVG to RPDA September 2023; chronic combined systolic and diastolic heart failure, hyperlipidemia, PAF s/p cardioversion, OSA on CPAP, hypertension, T2DM, Wegner's disease, melanoma, lung disease on supplemental O2 who is pending bronchoscopy by [name] and presents today for telephonic preoperative cardiovascular risk assessment.  History of Present Illness    HARLOWE VARRIALE is a 77 y.o. male who presents via audio/video conferencing for a telehealth visit today.  Pt was last seen in cardiology clinic on 09/24/2022 by Gillian Shields, NP.  At that time KORAN RUMLER was overall stable from a cardiac standpoint.  The patient is now pending procedure as outlined above. Since his last visit, he has continued to have difficulty breathing and is utilizing supplemental O2 with any activity. He typically uses  2L when moving around in his home and 3L with heavier exertion. He denies chest pain, pressure or tightness. No palpitations. He has occasional lower extremity edema which he manages with elevation and prn Lasix. He is able to sleep in bed at night with CPAP and 2L of O2. He has remained active within the limitations of his breathing issues performing light to moderate household chores, cooking, and light yard work. His has noticed his BP has been on the low side with SBP in the 90s. He has skipped his carvedilol the last couple of nights. Discussed checking BP prior to taking the carvedilol. If BP is <110/50 he will skip the dose.   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   Arthritis    Atrial fibrillation (HCC) 11/13/2014   Cancer (HCC)    skin   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/2016   LVEF 45-50% 11/2014   Coronary artery disease    Depression    Essential hypertension 11/13/2014   Hyperlipidemia    Hyperlipidemia 11/13/2014   Hypertension    Sleep apnea    CPAP 'Uses half the time"  last study 2009   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   Past Surgical History:  Procedure Laterality Date   CARDIOVERSION N/A 12/06/2014   Procedure: CARDIOVERSION;  Surgeon: Chilton Si, MD;  Location: [facility] ENDOSCOPY;  Service: Cardiovascular;  Laterality: N/A;   CARDIOVERSION N/A 05/26/2017   Procedure: CARDIOVERSION;  Surgeon: Chilton Si, MD;  Location: [facility] ENDOSCOPY;  Service: Cardiovascular;  Laterality:  N/A;   CARDIOVERSION N/A 04/25/2019   Procedure: CARDIOVERSION;  Surgeon: Thurmon Fair, MD;  Location: [facility] ENDOSCOPY;  Service: Cardiovascular;  Laterality: N/A;   CARDIOVERSION N/A 02/26/2022   Procedure: CARDIOVERSION;  Surgeon: Chilton Si, MD;  Location: [facility] ENDOSCOPY;  Service: Cardiovascular;  Laterality: N/A;   CARDIOVERSION N/A 05/13/2022   Procedure:  CARDIOVERSION;  Surgeon: Quintella Reichert, MD;  Location: [facility] INVASIVE CV LAB;  Service: Cardiovascular;  Laterality: N/A;   CARPAL TUNNEL RELEASE     CORONARY ANGIOPLASTY     multiple stents   CORONARY ARTERY BYPASS GRAFT  2006   CORONARY STENT INTERVENTION N/A 09/28/2021   Procedure: CORONARY STENT INTERVENTION;  Surgeon: Runell Gess, MD;  Location: [facility] INVASIVE CV LAB;  Service: Cardiovascular;  Laterality: N/A;  SVG-PDA   EYE SURGERY     right eye-  muscular repair   LEFT HEART CATH AND CORS/GRAFTS ANGIOGRAPHY N/A 09/28/2021   Procedure: LEFT HEART CATH AND CORS/GRAFTS ANGIOGRAPHY;  Surgeon: Runell Gess, MD;  Location: [facility] INVASIVE CV LAB;  Service: Cardiovascular;  Laterality: N/A;   TOTAL HIP ARTHROPLASTY  01/03/2012   Procedure: TOTAL HIP ARTHROPLASTY;  Surgeon: Jacki Cones, MD;  Location: [facility] ORS;  Service: Orthopedics;  Laterality: Right;    Allergies  Allergies  Allergen Reactions   Farxiga [Dapagliflozin] Other (See Comments)    Lightheadedness Low blood pressure   Statins Other (See Comments)    Myalgias     Home Medications    Prior to Admission medications   Medication Sig Start Date End Date Taking? Authorizing Provider  ALPRAZolam Prudy Feeler) 0.5 MG tablet Take 0.5 mg by mouth at bedtime. Takes at 11:00pm    [provider]  amLODipine (NORVASC) 2.5 MG tablet Take 2.5 mg by mouth daily. Takes at 6:30am 04/13/22   [provider]  carvedilol (COREG) 25 MG tablet Take 0.5 tablets (12.5 mg total) by mouth 2 (two) times daily. 03/06/22   Zannie Cove, MD  CHELATED ZINC PO Take 1 tablet by mouth daily.    [provider]  Cholecalciferol (VITAMIN D-3 PO) Take 1 capsule by mouth daily.    [provider]  clopidogrel (PLAVIX) 75 MG tablet Take 75 mg by mouth daily. 09/26/22   [provider]  Coenzyme Q10 (COQ10 PO) Take 1 capsule by mouth daily.    [provider]  dofetilide (TIKOSYN) 500 MCG capsule TAKE 1  CAPSULE BY MOUTH 2 TIMES DAILY. 11/02/22   Eustace Pen, PA-C  Evolocumab (REPATHA SURECLICK) 140 MG/ML SOAJ INJECT 140 MG INTO THE SKIN EVERY 14 (FOURTEEN) DAYS. INJECT 1 PEN INTO THE FATTY TISSUE SKIN EVERY 14 DAY 05/06/22   Chilton Si, MD  famotidine (PEPCID) 20 MG tablet Take 1 tablet (20 mg total) by mouth 2 (two) times daily. 05/25/22   Eustace Pen, PA-C  ferrous sulfate 325 (65 FE) MG EC tablet Take 325 mg by mouth every Monday, Wednesday, and Friday.    [provider]  furosemide (LASIX) 40 MG tablet Take 1 tablet (40 mg total) by mouth daily as needed. 05/11/22   Graciella Freer, PA-C  KLOR-CON M20 20 MEQ tablet TAKE 2 TABLETS BY MOUTH DAILY Patient taking differently: Take 20 mEq by mouth daily. 07/12/22   Eustace Pen, PA-C  levothyroxine (SYNTHROID, LEVOTHROID) 137 MCG tablet Take 137 mcg by mouth daily before breakfast. 10/27/14   [provider]  loratadine (CLARITIN) 10 MG tablet Take 10 mg by mouth daily.  [provider]  metFORMIN (GLUCOPHAGE) 500 MG tablet Take 500 mg by mouth 2 (two) times daily. 10/27/17   [provider]  Multiple Vitamins-Minerals (CENTRUM SILVER 50+MEN) TABS Take 1 tablet by mouth daily.    [provider]  nitroGLYCERIN (NITROSTAT) 0.4 MG SL tablet Place 1 tablet (0.4 mg total) under the tongue every 5 (five) minutes x 3 doses as needed for chest pain. 09/29/21   Jonita Albee, PA-C  pantoprazole (PROTONIX) 40 MG tablet Take 40 mg by mouth daily.    [provider]  prednisoLONE 5 MG TABS tablet Tapered dose 20 mg daily    [provider]  riTUXimab (RITUXAN IV) Inject 1 application  into the vein See admin instructions. Take every two weeks and every six months alternating    [provider]  Semaglutide (OZEMPIC, 0.25 OR 0.5 MG/DOSE, Crisp) Inject 0.5 mg into the skin once a week.    [provider]  testosterone cypionate (DEPOTESTOSTERONE CYPIONATE)  200 MG/ML injection Inject 150 mg into the muscle every 14 (fourteen) days.    [provider]  venlafaxine XR (EFFEXOR-XR) 150 MG 24 hr capsule Take 150 mg by mouth daily.    [provider]  warfarin (COUMADIN) 5 MG tablet TAKE 1/2 TABLET TO 1 TABLET BY MOUTH DAILY AS DIRECTED BY COUMADIN CLINIC 11/11/22   Chilton Si, MD    Physical Exam    Vital Signs:  [name] does not have vital signs available for review today.  Given telephonic nature of communication, physical exam is limited. AAOx3. NAD. Normal affect.  Speech and respirations are unlabored.  Accessory Clinical Findings    None  Assessment & Plan    Primary Cardiologist: Chilton Si, MD  Preoperative cardiovascular risk assessment. Bronchoscopy with [name].   Chart reviewed as part of pre-operative protocol coverage. According to the RCRI, patient has a 6.6% risk of MACE. Patient reports activity equivalent to 4.0 METS (light to moderate household activities, light yard work).   Given past medical history and time since last visit, based on ACC/AHA guidelines, CHARELS BRENIZER would be at acceptable risk for the planned procedure without further cardiovascular testing.   Patient was advised that if he develops new symptoms prior to surgery to contact our office to arrange a follow-up appointment.  he verbalized understanding.  Per Pharm D, patient may hold warfarin for 5 days prior to procedure.  Patient will not need bridging with Lovenox (enoxaparin) around procedure.  Per office protocol, he may hold Plavix for 5 days prior to procedure. When he is hemodynamically stable he will stop Plavix and begin aspirin 81 mg daily.   I will route this recommendation to the requesting party via Epic fax function.  Please call with questions.  Time:   Today, I have spent 10 minutes with the patient with telehealth technology discussing medical history, symptoms, and management plan.      Carlos Levering, NP  12/13/2022, 4:38 PM

## 2022-12-14 ENCOUNTER — Ambulatory Visit (HOSPITAL_COMMUNITY): Admission: RE | Admit: 2022-12-14 | Payer: Medicare Other | Source: Ambulatory Visit | Admitting: Internal Medicine

## 2022-12-14 SURGERY — VIDEO BRONCHOSCOPY WITHOUT FLUORO
Anesthesia: General

## 2022-12-14 NOTE — Telephone Encounter (Signed)
Dr Marchelle [name]- the bronch has been cancelled due to not getting cardiac clearance regarding warfarin and plavix. Please advise on another date for procedure. Thanks.

## 2022-12-16 NOTE — Telephone Encounter (Signed)
Reviewed notes from Dr. Chilton Si PA at [facility].  This patient was seen by them recently 12/13/2022.  They have deemed the patient to be acceptable risk for bronchoscopy.  Will need to hold warfarin 5 days prior to procedure.  Does not need Lovenox bridging.  Hold Plavix 5 days before procedure.  I am seeing the patient 12/21/2022 and I will discuss.  Closing encounter right now

## 2022-12-17 ENCOUNTER — Telehealth (HOSPITAL_COMMUNITY): Payer: Self-pay | Admitting: *Deleted

## 2022-12-17 NOTE — Telephone Encounter (Signed)
Left message to discuss

## 2022-12-17 NOTE — Telephone Encounter (Signed)
-----   Message from [name] sent at 12/14/2022 10:41 AM EST ----- Gary [name],  Can you please follow up with patient if he has been having a lot of hypotension and if symptomatic? He has been on coreg 12.5 mg BID I believe. Thank you ----- Message ----- From: Carlos Levering, NP Sent: 12/13/2022   5:01 PM EST To: Eustace Pen, PA-C  Hello!  I am covering pre-op today and spoke with this patient regarding his upcoming bronchoscopy. He mentioned that his BP has been on the low side with SBP in the 90s. I instructed him to check BP prior to taking carvedilol and if BP <110/50 to hold dose. I told him that I would reach out to you regarding possibly reducing his dose of carvedilol. He does not see you until the end of January. Since you are familiar with him and see him regularly you are likely the best person to make this adjustment for him.   Thank you so much!  Gavin Pound

## 2022-12-20 ENCOUNTER — Ambulatory Visit: Payer: Medicare Other | Attending: Cardiovascular Disease

## 2022-12-20 DIAGNOSIS — I480 Paroxysmal atrial fibrillation: Secondary | ICD-10-CM | POA: Insufficient documentation

## 2022-12-20 DIAGNOSIS — Z7901 Long term (current) use of anticoagulants: Secondary | ICD-10-CM | POA: Diagnosis not present

## 2022-12-20 LAB — POCT INR: INR: 2.8 (ref 2.0–3.0)

## 2022-12-20 NOTE — Progress Notes (Signed)
Inpatient consultation Dr. Isaiah Serge 09/18/2022  History of Present Illness:  77 year old male who presented to the emergency room on 9/13 with chief complaint of 3-day history of worsening shortness of breath.  Has oxygen at home, but typically does not use or needed.  Noting pulse oximetry to drop into the 80s with any exertion, progressed to the point where he was short of breath just talking.  Did endorse mildly productive cough worse than his baseline chronic cough.  No fever chills or chest discomfort no nausea or vomiting no sick exposure.  COVID test outpatient was negative ER evaluation: CT chest was negative for pulmonary emboli.  There was progressive bilateral groundglass opacities throughout both lungs much worse than when comparing CT imaging 6 months ago BNP was 322, troponin I negative. He was placed on supplemental oxygen, pulse IV steroids, and broad-spectrum antibiotics. Pulmonary asked to evaluate for further recommendations on management   He has history of Wegener's diagnosed 5 years ago.  He has been on Rituxan for the past 5 years and chronic prednisone at 5 mg [initially on 60 mg of prednisone].  The Rituxan dose was increased 1 year ago due to poor control of symptoms   He used to run a Administrator, sports.  Quit smoking at the age of 23.  No significant exposures, travel history.  No family history of lung disease.  He was started on portable oxygen through the CPAP and with exertion 6 months ago during hospitalization for pneumonia.   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  Differential diagnosis includes flare of known underlying ILD, pneumonia (bacterial versus viral) less likely pulmonary edema.  In retrospect his episode of pneumonia in February 2024 may have been an ILD flare.  Now he has extensive opacities and likely some underlying fibrosis   Plan/rec Continue supplemental oxygen Continue Solu-Medrol at 40 IV every 12.  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 Chilton Greathouse in the hospital setting.  Subjective:  Patient ID: Gary [name], male , DOB: 1945/05/31 , age 77 y.o. , MRN: [id number] , ADDRESS: [address] [place] [address] PCP Charlane Ferretti, DO Patient Care Team: Charlane Ferretti, DO as PCP - General (Internal Medicine) Chilton Si, MD as PCP - Cardiology (Cardiology)  This Provider for this visit: Treatment Team:  Attending Provider: Kalman Shan, MD    11/04/2022 -   Chief Complaint  Patient presents with   Pulmonary Consult    ILD- Referred by Dr. Cherlynn June. Pt with Wegener's dz.      HPI Gary [name] 77 y.o. -is a posthospital follow-up.  He is a patient of Dr. Dierdre Forth and he decided to establish his care with [name] in the ILD clinic.  History is gained from review of the  medical records and also talking to him.  He tells me that he has had Wegener's for 5 years.  In initially started in the joints.  Has been on Rituxan every 6 months 1 single dose.  Then in September 2024 because the flareup [see above medical record] he is now on Rituxan  every 6 months but 2 doses day 0 and day 15.  It appears he also had an admission in February 2024 and in retrospect pulmonary Dr. Isaiah Serge thought during the hospital stay in September whether this was an ILD flare.  Review of his images show that he has had a CT abdomen in 2008 where the lung images were pristine but the next CT is in February 2024 that shows groundglass opacities which appear to be worse in September 2024.  The latest CT in September 20 21st with contrast.  Tomorrow he is going to get a high-resolution CT chest without contrast and it has been ordered by his primary care physician.  Since discharge he is using oxygen with exertion and at night.  He says it is only required when he does groceries or yard work.  He is on a tapering schedule of prednisone that was.  Set.  Is currently on 20 mg prednisone for the next 2 weeks and then it appears he is slowly going to come to baseline of 5 mg.  His baseline dose is because of his Wegener's and because it helps his sinuses.  He did an ILD questionnaire and it is as below.   [facility] Comprehensive ILD Questionnaire  Symptoms:   Past Medical History :  -Significant for Wegener's grandma ptosis. - Has diabetes for the last several months - Also marked positive for rheumatoid arthritis and not sure - He says he has had pneumonia - Diagnose of heart failure and otherwise specified.  EF in February 2020 for global hypokinesis and EF 50% -He has significant coronary artery disease which she believes that the Wegener's makes it worse.   ROS:  -Positive for fatigue - He has a little dysphagia - She is lost 20 pounds of weight in the last few months - She does have some ulcers in the mouth for the last several weeks that he says will not go away  FAMILY HISTORY of LUNG DISEASE:  -His wife has autoimmune disease  PERSONAL EXPOSURE HISTORY:  -He started smoking at age 77.  He stopped smoking at age 77.  He smoked 2 packs a day.   He never vape marijuana.  Never did cocaine.  HOME  EXPOSURE and HOBBY DETAILS :  -Single-family home which is [address] years old [place].  He is lived there for 30 years.  Detail organic antigen exposure history is negative  OCCUPATIONAL HISTORY (122 questions) : That she is retired.  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  He did some backyard gardening otherwise detail organic and inorganic antigen exposure history is negative for                       PULMONARY TOXICITY HISTORY (27 items):  He is on chronic prednisone That she is on Rituxan That she is on beta-blocker That she was on hydrochlorothiazide between 2019 and June 2024  INVESTIGATIONS: x     CT Chest data from date: As below  - personally visualized and independently interpreted : A lot of groundglass opacities and possible NSIP - my findings are: As above   Narrative &  Impression  CLINICAL DATA:  Shortness of breath, evaluate for PE   EXAM: CT ANGIOGRAPHY CHEST WITH CONTRAST   TECHNIQUE: Multidetector CT imaging of the chest was performed using the standard protocol during bolus administration of intravenous contrast. Multiplanar CT image reconstructions and MIPs were obtained to evaluate the vascular anatomy.   RADIATION DOSE REDUCTION: This exam was performed according to the departmental dose-optimization program which includes automated exposure control, adjustment of the mA and/or kV according to patient size and/or use of iterative reconstruction technique.   CONTRAST:  OMNIPAQUE IOHEXOL 350 MG/ML SOLN   COMPARISON:  Chest radiograph dated 09/17/2022   FINDINGS: Cardiovascular: Satisfactory opacification the bilateral pulmonary arteries to the segmental level. No evidence of pulmonary embolism   Study is not tailored for evaluation of the thoracic aorta. No evidence of thoracic aortic aneurysm or dissection.   Cardiomegaly.  No pericardial effusion.   Severe  three-vessel coronary atherosclerosis with LAD stents.   Mediastinum/Nodes: Small mediastinal nodes, including a dominant 15 mm short axis right hilar node.   Visualized thyroid is unremarkable.   Lungs/Pleura: Progressive patchy ground-glass opacities in the lungs bilaterally, lower lobe and subpleural/peripheral predominant, compatible with progressive chronic interstitial lung disease. Associated mild subpleural reticulation/fibrosis. 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   Superimposed mild patchy opacities in the anterior left upper lobe (series 6/image 55), grossly unchanged from the prior, likely reflecting scarring.   No focal consolidation.   No suspicious pulmonary nodules.   Mild paraseptal emphysematous changes in the upper lobes.   No pleural effusion or pneumothorax.   Upper Abdomen: Visualized upper abdomen is notable for tiny layering gallstones and vascular calcifications.   Musculoskeletal: Degenerative changes of the visualized thoracolumbar spine. Median sternotomy.   Review of the MIP images confirms the above findings.   IMPRESSION: No evidence of pulmonary embolism.   Progressive chronic interstitial lung disease, favoring fibrotic NSIP.   Aortic Atherosclerosis (ICD10-I70.0) and Emphysema (ICD10-J43.9).     Electronically Signed   By: Charline Bills M.D.   On: 09/17/2022 17:13       PFT  OV 12/21/2022  Subjective:  Patient ID: Gary [name], male , DOB: 29-Apr-1945 , age 37 y.o. , MRN: [id number] , ADDRESS: [address] [place] [address] PCP Charlane Ferretti, DO Patient Care Team: Charlane Ferretti, DO as PCP - General (Internal Medicine) Chilton Si, MD as PCP - Cardiology (Cardiology)  This Provider for this visit: Treatment Team:  Attending Provider: Kalman Shan, MD    12/21/2022 -   Chief Complaint  Patient presents with   Follow-up    7  week still having a lot more sob with active , his oxygen has done into the lower 80%   Follow-up interstitial lung disease in the setting of Wegener's granulomatosis and also Rituxan therapy Respiratory failure requiring hospitalization February 2024 and oxygen therapy.  HPI Gary [name] 77 y.o. -returns for follow-up.  At this visit was to discuss bronchoscopy results but unfortunately the bronchoscopy was canceled because he did not get cardiac clearance with his anticoagulation on time.  But in the interim he reports no new medical problems no admissions no hospitalizations no surgeries no emergency room visit.  He continues on 2 L nasal cannula at rest and 4 L with exertion.  He says if he exerts himself too much she will desaturate below 88%.  Although today after we turned his oxygen off he was 96% on room  air at rest.     In any event he is completed a lot of his workup  -CT chest high-resolution 11/05/2022: Inflammatory phase is gone he is more fibrotic there is definite bilateral lower lobe subpleural honeycombing.  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  Thoracic radiology feels it is alternate diagnosis such as NSIP and HP which can also have honeycombing.  In any event honeycombing is a bad prognostic marker for future progression.  This would require antifibrotic.  Discussed the 2 antifibrotic's nintedanib and pirfenidone.  Nintedanib would be slightly favored in the case of non-- IPF progressive phenotype but he is got cardiac history is on anticoagulation.  Therefore recommended pirfenidone as first choice.  Discussed several issues about pirfenidone including [see below] and is agreed to proceed  = Esbriet/Pirfenidone requires intensive drug monitoring due to high concerns for Adverse effects of , including  Drug Induced Liver Injury, significant GI side effects that include but not limited to Diarrhea, Nausea, Vomiting,  and other system side effects that include  Fatigue, headaches, weight loss and other side effects such as skin rash. These will be monitored with  blood work such as LFT initially once a month for 6 months and then quarterly   -He did have an echocardiogram and there is a sharp increase in pulmonary artery systolic pressure.  He is only had a left heart catheterization but is not had right heart catheterization.  This echo was done 12/07/2022 and compared to February 2024.  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  Currently dyspnea is the main symptom and he wants relief from this.  This would require right heart catheterization.  This could be offered under insurance of if he prescreen passes the manufacture of inhaled treprostinil sponsoring a study called [employer] (Dr. Vilma Meckel is principal investigator].  I spoke about the concept of research study.  Patient is very interested in the study.  Gave him a copy of the consent to review and to reflect.  Did touch base with the study coordinator [facility] who will prescreen the patient.  I also reached out to Dr. Nanetta Batty to keep him in the loop and to ensure his alignment which would be critical for the patient.   -In terms of bronchoscopy: This is to rule out opportunistic infections in the setting of Rituxan and recent hospitalization.  I believe the risk is lower now.  Did indicate to the patient that I would like to complete workup of pulmonary hypertension and also get him started on antifibrotic's before we readdress bronchoscopy.  He is agreeable with this plan.   SYMPTOM SCALE - ILD 11/04/2022   Current weight    O2 use ra   Shortness of Breath 0 -> 5 scale with 5 being worst (score 6 If unable to do)   At rest 0   Simple tasks - showers, clothes change, eating, shaving 4   Household (dishes, doing bed, laundry) 4   Shopping 4   Walking level at  own pace 2   Walking up Stairs 4   Total (30-36) Dyspnea Score 18       Non-dyspnea symptoms (0-> 5 scale) 11/04/2022  How bad is your cough? 1  How bad is your fatigue 4  How bad is nausea Not bad  How bad is vomiting?  Not bad  How bad is  diarrhea? 0  How bad is anxiety? 0  How bad is depression 0  Any chronic pain - if so where and how bad 0    Simple office walk 224 (66+46 x 2) feet Pod A at [facility] x  3 laps goal with forehead probe 11/04/2022  12/21/2022   O2 used ra   Number laps completed 2 of 3   Comments about pace sav   Resting Pulse Ox/HR 99% and 60/min 96% RA At rest  Final Pulse Ox/HR 90% and 85/min   Desaturated </= 88% no   Desaturated <= 3% points yes   Got Tachycardic >/= 90/min no   Symptoms at end of test Dyspnea/faitgue   Miscellaneous comments x     CT Chest data from date: *11?124  - personally visualized and independently interpreted : YES - my findings are: UIP  IMPRESSION:   Lungs/Pleura: Diffuse interstitial thickening and coarsened ground-glass with traction bronchiectasis/bronchiolectasis, intralobular septal thickening and honeycombing. Findings are more organized than on 09/17/2022 and appear to have a slight craniocaudal gradient. Centrilobular and paraseptal emphysema. No pleural fluid. Airway is unremarkable. Mild air trapping.    1. Pulmonary parenchymal pattern of fibrosis, as detailed above, appears more organized than on 09/17/2022 and may be due to fibrotic hypersensitivity pneumonitis or fibrotic nonspecific interstitial pneumonitis. Findings are suggestive of an alternative diagnosis (not UIP) per consensus guidelines: Diagnosis of Idiopathic Pulmonary Fibrosis: An Official ATS/ERS/JRS/ALAT Clinical Practice Guideline. Am Rosezetta Schlatter Crit Care Med Vol 198, Iss 5, [phone], Sep 04 2016. 2. Cholelithiasis. 3. Punctate left renal stones. 4.  Aortic atherosclerosis (ICD10-I70.0). 5.  Emphysema (ICD10-J43.9).      Electronically Signed   By: Leanna Battles M.D.   On: 11/23/2022 14:11   ECHO IMPRESSIONS     1. Left ventricular ejection fraction, by estimation, is 60 to 65%. The  left ventricle has normal function. The left ventricle has no regional  wall motion abnormalities. Left ventricular diastolic parameters are  consistent with Grade I diastolic  dysfunction (impaired relaxation).   2. Mid systolic notching of the pulmonary artery pressure tracing  consistent with pulmonary hypertension. RVSP estimated at . . Right  ventricular systolic function is normal. The right ventricular size is  mildly enlarged.   3. Left atrial size was moderately dilated.   4. Right atrial size was severely dilated.   5. The mitral valve is normal in structure. Trivial mitral valve  regurgitation. No evidence of mitral stenosis. Moderate mitral annular  calcification.   6. The aortic valve is normal in structure. Aortic valve regurgitation is  not visualized. No aortic stenosis is present.   7. Aortic dilatation noted. There is mild dilatation of the ascending  aorta, measuring 41 mm.   8. The inferior vena cava is normal in size with greater than 50%  respiratory variability, suggesting right atrial pressure of 3 mmHg.   PFT     Latest Ref Rng & Units 11/15/2022    3:23 PM  PFT Results  FVC-Pre L 2.57   FVC-Predicted Pre % 67   FVC-Post L 2.74   FVC-Predicted Post % 72   Pre FEV1/FVC % % 86   Post FEV1/FCV % % 87   FEV1-Pre L 2.22   FEV1-Predicted Pre % 82   FEV1-Post L 2.38   DLCO uncorrected ml/min/mmHg 13.57   DLCO UNC% % 58   DLCO corrected ml/min/mmHg 15.33   DLCO COR %Predicted % 65   DLVA Predicted % 100   TLC  L 4.01   TLC % Predicted % 59   RV % Predicted % 45        LAB RESULTS last 96 hours No results found.  LAB RESULTS last 90 days Recent Results (from the past 2160 hours)  POCT INR     Status: Abnormal   Collection Time: 09/27/22  3:25 PM  Result Value Ref  Range   INR 1.1 (A) 2.0 - 3.0   POC INR    POCT INR     Status: None   Collection Time: 10/01/22  1:50 PM  Result Value Ref Range   INR     POC INR 2.4   POCT INR     Status: None   Collection Time: 10/06/22  3:50 PM  Result Value Ref Range   INR 2.6 2.0 - 3.0   POC INR    POCT INR     Status: Abnormal   Collection Time: 10/13/22  1:54 PM  Result Value Ref Range   INR 6.4 (A) 2.0 - 3.0   POC INR    Protime-INR     Status: Abnormal   Collection Time: 10/13/22  2:17 PM  Result Value Ref Range   INR 5.1 (HH) 0.9 - 1.2    Comment: **Result Repeated** Reference interval is for non-anticoagulated patients. Suggested INR therapeutic range for Vitamin K antagonist therapy:    Standard Dose (moderate intensity                   therapeutic range):       2.0 - 3.0    Higher intensity therapeutic range       2.5 - 3.5    Prothrombin Time 51.2 (H) 9.1 - 12.0 sec  POCT INR     Status: None   Collection Time: 10/20/22 11:17 AM  Result Value Ref Range   INR 2.2 2.0 - 3.0   POC INR    Basic metabolic panel     Status: Abnormal   Collection Time: 10/27/22 10:26 AM  Result Value Ref Range   Sodium 141 135 - 145 mmol/L   Potassium 4.7 3.5 - 5.1 mmol/L   Chloride 106 98 - 111 mmol/L   CO2 26 22 - 32 mmol/L   Glucose, Bld 122 (H) 70 - 99 mg/dL    Comment: Glucose reference range applies only to samples taken after fasting for at least 8 hours.   BUN 22 8 - 23 mg/dL   Creatinine, Ser 1.61 0.61 - 1.24 mg/dL   Calcium 9.2 8.9 - 09.6 mg/dL   GFR, Estimated >04 >54 mL/min    Comment: (NOTE) Calculated using the CKD-EPI Creatinine Equation (2021)    Anion gap 9 5 - 15    Comment: Performed at [facility], [address], [place], [place] [address]  Magnesium     Status: None   Collection Time: 10/27/22 10:26 AM  Result Value Ref Range   Magnesium 2.2 1.7 - 2.4 mg/dL    Comment: Performed at [facility], [address] [place], [place] [address]  POCT INR     Status:  Abnormal   Collection Time: 10/27/22 11:06 AM  Result Value Ref Range   INR 1.5 (A) 2.0 - 3.0   POC INR    POCT INR     Status: Abnormal   Collection Time: 11/02/22 11:35 AM  Result Value Ref Range   INR 1.3 (A) 2.0 - 3.0   POC INR  B Nat Peptide     Status: Abnormal   Collection Time: 11/04/22  3:19 PM  Result Value Ref Range   Pro B Natriuretic peptide (BNP) 416.0 (H) 0.0 - 100.0 pg/mL  QuantiFERON-TB Gold Plus     Status: None   Collection Time: 11/04/22  3:19 PM  Result Value Ref Range   QuantiFERON-TB Gold Plus NEGATIVE NEGATIVE    Comment: Negative test result. M. tuberculosis complex  infection unlikely.    NIL 0.03 IU/mL   Mitogen-NIL 5.23 IU/mL   TB1-NIL 0.00 IU/mL   TB2-NIL 0.00 IU/mL    Comment: . The Nil tube value reflects the background interferon gamma immune response of the patient's blood sample. This value has been subtracted from the patient's displayed TB and Mitogen results. . Lower than expected results with the Mitogen tube prevent false-negative Quantiferon readings by detecting a patient with a potential immune suppressive condition and/or suboptimal pre-analytical specimen handling. . The TB1 Antigen tube is coated with the M. tuberculosis-specific antigens designed to elicit responses from TB antigen primed CD4+ helper T-lymphocytes. . The TB2 Antigen tube is coated with the M. tuberculosis-specific antigens designed to elicit responses from TB antigen primed CD4+ helper and CD8+ cytotoxic T-lymphocytes. . For additional information, please refer to https://education.[facility].com/faq/FAQ204 (This link is being provided for informational/ educational purposes only.) .   Glucose 6 phosphate dehydrogenase     Status: Abnormal   Collection Time: 11/04/22  3:19 PM  Result Value Ref Range   G-6PDH 21.1 (H) 7.0 - 20.5 U/g Hgb  POCT INR     Status: Abnormal   Collection Time: 11/09/22  2:06 PM  Result Value Ref Range   INR 3.3  (A) 2.0 - 3.0   POC INR    Pulmonary function test     Status: None   Collection Time: 11/15/22  3:23 PM  Result Value Ref Range   FVC-Pre 2.57 L   FVC-%Pred-Pre 67 %   FVC-Post 2.74 L   FVC-%Pred-Post 72 %   FVC-%Change-Post 6 %   FEV1-Pre 2.22 L   FEV1-%Pred-Pre 82 %   FEV1-Post 2.38 L   FEV1-%Pred-Post 88 %   FEV1-%Change-Post 7 %   FEV6-Pre 2.51 L   FEV6-%Pred-Pre 71 %   FEV6-Post 2.71 L   FEV6-%Pred-Post 76 %   FEV6-%Change-Post 7 %   Pre FEV1/FVC ratio 86 %   FEV1FVC-%Pred-Pre 120 %   Post FEV1/FVC ratio 87 %   FEV1FVC-%Change-Post 0 %   Pre FEV6/FVC Ratio 100 %   FEV6FVC-%Pred-Pre 107 %   Post FEV6/FVC ratio 100 %   FEV6FVC-%Pred-Post 107 %   FEF 25-75 Pre 2.97 L/sec   FEF2575-%Pred-Pre 159 %   FEF 25-75 Post 3.97 L/sec   FEF2575-%Pred-Post 213 %   FEF2575-%Change-Post 33 %   RV 1.16 L   RV % pred 45 %   TLC 4.01 L   TLC % pred 59 %   DLCO unc 13.57 ml/min/mmHg   DLCO unc % pred 58 %   DLCO cor 15.33 ml/min/mmHg   DLCO cor % pred 65 %   DL/VA 0.86 ml/min/mmHg/L   DL/VA % pred 578 %  POCT INR     Status: None   Collection Time: 11/23/22  1:41 PM  Result Value Ref Range   INR 2.6 2.0 - 3.0   POC INR    ECHOCARDIOGRAM COMPLETE     Status: None   Collection Time: 12/07/22  4:17 PM  Result Value Ref Range   Area-P  1/2 3.47 cm2   S' Lateral 3.60 cm   AV Area mean vel 2.44 cm2   AR max vel 2.48 cm2   AV Area VTI 2.51 cm2   MV VTI 2.87 cm2   Ao pk vel 1.80 m/s   AV Mean grad 7.0 mmHg   AV Peak grad 13.0 mmHg   Est EF 60 - 65%   POCT INR     Status: None   Collection Time: 12/20/22 10:00 AM  Result Value Ref Range   INR 2.8 2.0 - 3.0   POC INR           has a past medical history of Aortic aneurysm (HCC) (02/20/2016), Arthritis, Atrial fibrillation (HCC) (11/13/2014), Cancer (HCC), Chronic combined systolic and diastolic heart failure (HCC) (01/06/7251), Coronary artery disease, Depression, Essential hypertension (11/13/2014), Hyperlipidemia,  Hyperlipidemia (11/13/2014), Hypertension, Sleep apnea, and Wegener's granulomatosis (11/18/2017).   reports that he quit smoking about 39 years ago. His smoking use included cigarettes. He has never used smokeless tobacco.  Past Surgical History:  Procedure Laterality Date   CARDIOVERSION N/A 12/06/2014   Procedure: CARDIOVERSION;  Surgeon: Chilton Si, MD;  Location: [facility] ENDOSCOPY;  Service: Cardiovascular;  Laterality: N/A;   CARDIOVERSION N/A 05/26/2017   Procedure: CARDIOVERSION;  Surgeon: Chilton Si, MD;  Location: [facility] ENDOSCOPY;  Service: Cardiovascular;  Laterality: N/A;   CARDIOVERSION N/A 04/25/2019   Procedure: CARDIOVERSION;  Surgeon: Thurmon Fair, MD;  Location: [facility] ENDOSCOPY;  Service: Cardiovascular;  Laterality: N/A;   CARDIOVERSION N/A 02/26/2022   Procedure: CARDIOVERSION;  Surgeon: Chilton Si, MD;  Location: [facility] ENDOSCOPY;  Service: Cardiovascular;  Laterality: N/A;   CARDIOVERSION N/A 05/13/2022   Procedure: CARDIOVERSION;  Surgeon: Quintella Reichert, MD;  Location: [facility];  Service: Cardiovascular;  Laterality: N/A;   CARPAL TUNNEL RELEASE     CORONARY ANGIOPLASTY     multiple stents   CORONARY ARTERY BYPASS GRAFT  2006   CORONARY STENT INTERVENTION N/A 09/28/2021   Procedure: CORONARY STENT INTERVENTION;  Surgeon: Runell Gess, MD;  Location: [facility];  Service: Cardiovascular;  Laterality: N/A;  SVG-PDA   EYE SURGERY     right eye-  muscular repair   LEFT HEART CATH AND CORS/GRAFTS ANGIOGRAPHY N/A 09/28/2021   Procedure: LEFT HEART CATH AND CORS/GRAFTS ANGIOGRAPHY;  Surgeon: Runell Gess, MD;  Location: [facility];  Service: Cardiovascular;  Laterality: N/A;   TOTAL HIP ARTHROPLASTY  01/03/2012   Procedure: TOTAL HIP ARTHROPLASTY;  Surgeon: Jacki Cones, MD;  Location: [facility];  Service: Orthopedics;  Laterality: Right;    Allergies  Allergen Reactions   Farxiga [Dapagliflozin] Other (See Comments)     Lightheadedness Low blood pressure   Statins Other (See Comments)    Myalgias     Immunization History  Administered Date(s) Administered   Influenza, High Dose Seasonal PF 10/05/2022   Influenza-Unspecified 11/01/2018   PFIZER(Purple Top)SARS-COV-2 Vaccination 02/10/2019, 03/03/2019, 09/06/2019   Pfizer Covid-19 Vaccine Bivalent Booster 48yrs & up 09/14/2020   Pneumococcal Polysaccharide-23 01/04/2012    Family History  Problem Relation Age of Onset   Heart attack Mother    Other Father        odd stomach disorder   Stroke Maternal Grandmother    Heart attack Maternal Grandfather    Heart attack Paternal Grandmother    Heart attack Paternal Grandfather      Current Outpatient Medications:    ALPRAZolam (XANAX) 0.5 MG tablet, Take 0.5 mg by mouth at bedtime.  Takes at 11:00pm, Disp: , Rfl:    amLODipine (NORVASC) 2.5 MG tablet, Take 2.5 mg by mouth daily. Takes at 6:30am, Disp: , Rfl:    Budeson-Glycopyrrol-Formoterol (BREZTRI AEROSPHERE) 160-9-4.8 MCG/ACT AERO, Inhale 2 puffs into the lungs in the morning and at bedtime., Disp: , Rfl:    carvedilol (COREG) 25 MG tablet, Take 0.5 tablets (12.5 mg total) by mouth 2 (two) times daily., Disp: , Rfl:    CHELATED ZINC PO, Take 1 tablet by mouth daily., Disp: , Rfl:    Cholecalciferol (VITAMIN D-3 PO), Take 1 capsule by mouth daily., Disp: , Rfl:    clopidogrel (PLAVIX) 75 MG tablet, Take 75 mg by mouth daily., Disp: , Rfl:    Coenzyme Q10 (COQ10 PO), Take 1 capsule by mouth daily., Disp: , Rfl:    dofetilide (TIKOSYN) 500 MCG capsule, TAKE 1 CAPSULE BY MOUTH 2 TIMES DAILY., Disp: 180 capsule, Rfl: 1   Evolocumab (REPATHA SURECLICK) 140 MG/ML SOAJ, INJECT 140 MG INTO THE SKIN EVERY 14 (FOURTEEN) DAYS. INJECT 1 PEN INTO THE FATTY TISSUE SKIN EVERY 14 DAY, Disp: 6 mL, Rfl: 3   famotidine (PEPCID) 20 MG tablet, Take 1 tablet (20 mg total) by mouth 2 (two) times daily., Disp: , Rfl:    ferrous sulfate 325 (65 FE) MG EC tablet, Take 325 mg  by mouth every Monday, Wednesday, and Friday., Disp: , Rfl:    furosemide (LASIX) 40 MG tablet, Take 1 tablet (40 mg total) by mouth daily as needed., Disp: 30 tablet, Rfl: 3   KLOR-CON M20 20 MEQ tablet, TAKE 2 TABLETS BY MOUTH DAILY (Patient taking differently: Take 20 mEq by mouth daily.), Disp: 180 tablet, Rfl: 1   levothyroxine (SYNTHROID, LEVOTHROID) 137 MCG tablet, Take 137 mcg by mouth daily before breakfast., Disp: , Rfl:    loratadine (CLARITIN) 10 MG tablet, Take 10 mg by mouth daily., Disp: , Rfl:    metFORMIN (GLUCOPHAGE) 500 MG tablet, Take 500 mg by mouth 2 (two) times daily., Disp: , Rfl: 12   Multiple Vitamins-Minerals (CENTRUM SILVER 50+MEN) TABS, Take 1 tablet by mouth daily., Disp: , Rfl:    nitroGLYCERIN (NITROSTAT) 0.4 MG SL tablet, Place 1 tablet (0.4 mg total) under the tongue every 5 (five) minutes x 3 doses as needed for chest pain., Disp: 25 tablet, Rfl: 12   pantoprazole (PROTONIX) 40 MG tablet, Take 40 mg by mouth daily., Disp: , Rfl:    prednisoLONE 5 MG TABS tablet, Tapered dose 20 mg daily, Disp: , Rfl:    riTUXimab (RITUXAN IV), Inject 1 application  into the vein See admin instructions. Take every two weeks and every six months alternating, Disp: , Rfl:    Semaglutide (OZEMPIC, 0.25 OR 0.5 MG/DOSE, Vaughn), Inject 0.5 mg into the skin once a week., Disp: , Rfl:    testosterone cypionate (DEPOTESTOSTERONE CYPIONATE) 200 MG/ML injection, Inject 150 mg into the muscle every 14 (fourteen) days., Disp: , Rfl:    venlafaxine XR (EFFEXOR-XR) 150 MG 24 hr capsule, Take 150 mg by mouth daily., Disp: , Rfl:    warfarin (COUMADIN) 5 MG tablet, TAKE 1/2 TABLET TO 1 TABLET BY MOUTH DAILY AS DIRECTED BY COUMADIN CLINIC, Disp: 70 tablet, Rfl: 1      Objective:   Vitals:   12/21/22 0955 12/21/22 1037  BP: 134/79   Pulse: 62   SpO2: 95% 96%  Weight: 223 lb (101.2 kg)   Height: 5' 8.5" (1.74 m)     Estimated body mass index is  33.41 kg/m as calculated from the following:    Height as of this encounter: 5' 8.5" (1.74 m).   Weight as of this encounter: 223 lb (101.2 kg).  @WEIGHTCHANGE @  [employer]   12/21/22 0955  Weight: 223 lb (101.2 kg)     Physical Exam   General: No distress. Mild cushingiod O2 at rest: yes but 96% RA rest Cane present: no Sitting in wheel chair: no Frail: yes Obese: yes Neuro: Alert and Oriented x 3. GCS 15. Speech normal Psych: Pleasant Resp:  Barrel Chest - no.  Wheeze - no, Crackles - YES SOME BASE, No overt respiratory distress CVS: Normal heart sounds. Murmurs - no Ext: Stigmata of Connective Tissue Disease - no HEENT: Normal upper airway. PEERL +. No post nasal drip        Assessment:       ICD-10-CM   1. ILD (interstitial lung disease) (HCC)  J84.9     2. 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 Chronic respiratory failure with hypoxia (HCC)  J96.11     4. High risk medication use  Z79.899     5. 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 Immunosuppressed status (HCC)  D84.9     7. Dyspnea, unspecified type  R06.00          Plan:     Patient Instructions     ICD-10-CM   1. ILD (interstitial lung disease) (HCC)  J84.9     2. 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 Chronic respiratory failure with hypoxia (HCC)  J96.11     4. High risk medication use  Z79.899     5. 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 Immunosuppressed status (HCC)  D84.9     7. Dyspnea, unspecified type  R06.00       In 2008 you had clean lungs on the CT lung field.  However as of February 2024 he developed pulmonary inflammation in the background of Wegener's for the last 5 years and Rituxan therapy for the last 5 years resulting in hospitalization September 2024 with respiratory failure.  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 about the need for right heart catheterization  -For concern for opportunistic infection   - Sorry cardiology clearance came late but at this point in time we will hold off on bronchoscopy till you complete right heart catheterization procedures  = Risk for opportunistic infection has diminished but not excluded   -Meanwhile  - continue oxygen therapy at night and with exertion to keep your pulse ox greater than 88% - continue prednisone taper outline prior - [name] - continue rituxan per [name]   Follow-up - 6-weeks 30-minute visit but after completing all of the above; face-to-face  ideally if not video visit    FOLLOWUP Return in about 6 weeks (around 02/01/2023) for 30 min visit, ILD, with Dr Marchelle [name], with any of the APPS, Face to Face OR Video Visit.    SIGNATURE    Dr. Kalman Shan, M.D., F.C.C.P,  Pulmonary and Critical Care Medicine Staff Physician, [facility] Center Director - Interstitial Lung Disease  Program  Pulmonary Fibrosis [facility] at [facility] [place], [place], [address]  Pager: [phone], If no answer or between  15:00h - 7:00h: call [phone] Telephone: [phone]  10:46 AM 12/21/2022     HIGh Complexity  OFFICE   2021 E/M guidelines, first released in 2021, with minor revisions added in 2023. Must meet the requirements for 2 out of 3 dimensions to qualify.     Number and complexity of problems addressed Amount and/or complexity of data reviewed Risk of complications and/or morbidity  Severe exacerbation of chronic illness  Acute or chronic illnesses that may pose a threat to life or bodily function, e.g., multiple trauma, acute MI, pulmonary embolus, severe respiratory distress, progressive rheumatoid arthritis, psychiatric illness with potential threat to self or others, peritonitis, acute renal failure, abrupt change in neurological status Must meet the requirements for 2 of 3 of the categories)  Category 1: Tests and documents, historian  Any combination of 3 of the following:  Assessment requiring an independent historian  Review of prior external note(s) from each unique source - left heart cath  Review of results of each unique test  Ordering of each unique test    Category 2: Interpretation of tests    Independent interpretation of a test performed by another physician/other qualified health care professional (not separately reported)  Category 3: Discuss management/tests  Discussion of management or test interpretation with external physician/other qualified health care professional/appropriate source (not separately reported) -  HIGH risk of morbidity from additional diagnostic testing or treatment Examples only:  Drug therapy requiring intensive monitoring for toxicity  Decision for elective major surgery with identified pateint or procedure risk factors  Decision regarding hospitalization or escalation of level of care  Decision for DNR or to de-escalate care   Parenteral controlled  substances

## 2022-12-20 NOTE — Patient Instructions (Signed)
Description   Continue taking 1 tablet Daily, except 0.5 tablet on Monday, Wednesday and Friday. Recheck INR in 5 weeks.  Report to ER with any bleeding, falls, accidents.  Please call us at [phone] with any questions.

## 2022-12-20 NOTE — H&P (View-Only) (Signed)
 Inpatient consultation Dr. Isaiah Serge 09/18/2022  History of Present Illness:  77 year old male who presented to the emergency room on 9/13 with chief complaint of 3-day history of worsening shortness of breath.  Has oxygen at home, but typically does not use or needed.  Noting pulse oximetry to drop into the 80s with any exertion, progressed to the point where he was short of breath just talking.  Did endorse mildly productive cough worse than his baseline chronic cough.  No fever chills or chest discomfort no nausea or vomiting no sick exposure.  COVID test outpatient was negative ER evaluation: CT chest was negative for pulmonary emboli.  There was progressive bilateral groundglass opacities throughout both lungs much worse than when comparing CT imaging 6 months ago BNP was 322, troponin I negative. He was placed on supplemental oxygen, pulse IV steroids, and broad-spectrum antibiotics. Pulmonary asked to evaluate for further recommendations on management   He has history of Wegener's diagnosed 5 years ago.  He has been on Rituxan for the past 5 years and chronic prednisone at 5 mg [initially on 60 mg of prednisone].  The Rituxan dose was increased 1 year ago due to poor control of symptoms   He used to run a Administrator, sports.  Quit smoking at the age of 23.  No significant exposures, travel history.  No family history of lung disease.  He was started on portable oxygen through the CPAP and with exertion 6 months ago during hospitalization for pneumonia.   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  Differential diagnosis includes flare of known underlying ILD, pneumonia (bacterial versus viral) less likely pulmonary edema.  In retrospect his episode of pneumonia in February 2024 may have been an ILD flare.  Now he has extensive opacities and likely some underlying fibrosis   Plan/rec Continue supplemental oxygen Continue Solu-Medrol at 40 IV every 12.  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 Chilton Greathouse in the hospital setting.  Subjective:  Patient ID: Gary [name], male , DOB: 1945/05/31 , age 53 y.o. , MRN: [id number] , ADDRESS: [address] [place] [address] PCP Charlane Ferretti, DO Patient Care Team: Charlane Ferretti, DO as PCP - General (Internal Medicine) Chilton Si, MD as PCP - Cardiology (Cardiology)  This Provider for this visit: Treatment Team:  Attending Provider: Kalman Shan, MD    11/04/2022 -   Chief Complaint  Patient presents with   Pulmonary Consult    ILD- Referred by Dr. Cherlynn June. Pt with Wegener's dz.      HPI Gary [name] 77 y.o. -is a posthospital follow-up.  He is a patient of Dr. Dierdre Forth and he decided to establish his care with [name] in the ILD clinic.  History is gained from review of the  medical records and also talking to him.  He tells me that he has had Wegener's for 5 years.  In initially started in the joints.  Has been on Rituxan every 6 months 1 single dose.  Then in September 2024 because the flareup [see above medical record] he is now on Rituxan  every 6 months but 2 doses day 0 and day 15.  It appears he also had an admission in February 2024 and in retrospect pulmonary Dr. Isaiah Serge thought during the hospital stay in September whether this was an ILD flare.  Review of his images show that he has had a CT abdomen in 2008 where the lung images were pristine but the next CT is in February 2024 that shows groundglass opacities which appear to be worse in September 2024.  The latest CT in September 20 21st with contrast.  Tomorrow he is going to get a high-resolution CT chest without contrast and it has been ordered by his primary care physician.  Since discharge he is using oxygen with exertion and at night.  He says it is only required when he does groceries or yard work.  He is on a tapering schedule of prednisone that was.  Set.  Is currently on 20 mg prednisone for the next 2 weeks and then it appears he is slowly going to come to baseline of 5 mg.  His baseline dose is because of his Wegener's and because it helps his sinuses.  He did an ILD questionnaire and it is as below.   [facility] Comprehensive ILD Questionnaire  Symptoms:   Past Medical History :  -Significant for Wegener's grandma ptosis. - Has diabetes for the last several months - Also marked positive for rheumatoid arthritis and not sure - He says he has had pneumonia - Diagnose of heart failure and otherwise specified.  EF in February 2020 for global hypokinesis and EF 50% -He has significant coronary artery disease which she believes that the Wegener's makes it worse.   ROS:  -Positive for fatigue - He has a little dysphagia - She is lost 20 pounds of weight in the last few months - She does have some ulcers in the mouth for the last several weeks that he says will not go away  FAMILY HISTORY of LUNG DISEASE:  -His wife has autoimmune disease  PERSONAL EXPOSURE HISTORY:  -He started smoking at age 26.  He stopped smoking at age 48.  He smoked 2 packs a day.   He never vape marijuana.  Never did cocaine.  HOME  EXPOSURE and HOBBY DETAILS :  -Single-family home which is [address] years old [place].  He is lived there for 30 years.  Detail organic antigen exposure history is negative  OCCUPATIONAL HISTORY (122 questions) : That she is retired.  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  He did some backyard gardening otherwise detail organic and inorganic antigen exposure history is negative for                       PULMONARY TOXICITY HISTORY (27 items):  He is on chronic prednisone That she is on Rituxan That she is on beta-blocker That she was on hydrochlorothiazide between 2019 and June 2024  INVESTIGATIONS: x     CT Chest data from date: As below  - personally visualized and independently interpreted : A lot of groundglass opacities and possible NSIP - my findings are: As above   Narrative &  Impression  CLINICAL DATA:  Shortness of breath, evaluate for PE   EXAM: CT ANGIOGRAPHY CHEST WITH CONTRAST   TECHNIQUE: Multidetector CT imaging of the chest was performed using the standard protocol during bolus administration of intravenous contrast. Multiplanar CT image reconstructions and MIPs were obtained to evaluate the vascular anatomy.   RADIATION DOSE REDUCTION: This exam was performed according to the departmental dose-optimization program which includes automated exposure control, adjustment of the mA and/or kV according to patient size and/or use of iterative reconstruction technique.   CONTRAST:  OMNIPAQUE IOHEXOL 350 MG/ML SOLN   COMPARISON:  Chest radiograph dated 09/17/2022   FINDINGS: Cardiovascular: Satisfactory opacification the bilateral pulmonary arteries to the segmental level. No evidence of pulmonary embolism   Study is not tailored for evaluation of the thoracic aorta. No evidence of thoracic aortic aneurysm or dissection.   Cardiomegaly.  No pericardial effusion.   Severe  three-vessel coronary atherosclerosis with LAD stents.   Mediastinum/Nodes: Small mediastinal nodes, including a dominant 15 mm short axis right hilar node.   Visualized thyroid is unremarkable.   Lungs/Pleura: Progressive patchy ground-glass opacities in the lungs bilaterally, lower lobe and subpleural/peripheral predominant, compatible with progressive chronic interstitial lung disease. Associated mild subpleural reticulation/fibrosis. 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   Superimposed mild patchy opacities in the anterior left upper lobe (series 6/image 55), grossly unchanged from the prior, likely reflecting scarring.   No focal consolidation.   No suspicious pulmonary nodules.   Mild paraseptal emphysematous changes in the upper lobes.   No pleural effusion or pneumothorax.   Upper Abdomen: Visualized upper abdomen is notable for tiny layering gallstones and vascular calcifications.   Musculoskeletal: Degenerative changes of the visualized thoracolumbar spine. Median sternotomy.   Review of the MIP images confirms the above findings.   IMPRESSION: No evidence of pulmonary embolism.   Progressive chronic interstitial lung disease, favoring fibrotic NSIP.   Aortic Atherosclerosis (ICD10-I70.0) and Emphysema (ICD10-J43.9).     Electronically Signed   By: Charline Bills M.D.   On: 09/17/2022 17:13       PFT  OV 12/21/2022  Subjective:  Patient ID: [name], male , DOB: 29-Apr-1945 , age 37 y.o. , MRN: [id number] , ADDRESS: [address] [place] [address] PCP Charlane Ferretti, DO Patient Care Team: Charlane Ferretti, DO as PCP - General (Internal Medicine) Chilton Si, MD as PCP - Cardiology (Cardiology)  This Provider for this visit: Treatment Team:  Attending Provider: Kalman Shan, MD    12/21/2022 -   Chief Complaint  Patient presents with   Follow-up    7  week still having a lot more sob with active , his oxygen has done into the lower 80%   Follow-up interstitial lung disease in the setting of Wegener's granulomatosis and also Rituxan therapy Respiratory failure requiring hospitalization February 2024 and oxygen therapy.  HPI Gary [name] 77 y.o. -returns for follow-up.  At this visit was to discuss bronchoscopy results but unfortunately the bronchoscopy was canceled because he did not get cardiac clearance with his anticoagulation on time.  But in the interim he reports no new medical problems no admissions no hospitalizations no surgeries no emergency room visit.  He continues on 2 L nasal cannula at rest and 4 L with exertion.  He says if he exerts himself too much she will desaturate below 88%.  Although today after we turned his oxygen off he was 96% on room  air at rest.     In any event he is completed a lot of his workup  -CT chest high-resolution 11/05/2022: Inflammatory phase is gone he is more fibrotic there is definite bilateral lower lobe subpleural honeycombing.  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  Thoracic radiology feels it is alternate diagnosis such as NSIP and HP which can also have honeycombing.  In any event honeycombing is a bad prognostic marker for future progression.  This would require antifibrotic.  Discussed the 2 antifibrotic's nintedanib and pirfenidone.  Nintedanib would be slightly favored in the case of non-- IPF progressive phenotype but he is got cardiac history is on anticoagulation.  Therefore recommended pirfenidone as first choice.  Discussed several issues about pirfenidone including [see below] and is agreed to proceed  = Esbriet/Pirfenidone requires intensive drug monitoring due to high concerns for Adverse effects of , including  Drug Induced Liver Injury, significant GI side effects that include but not limited to Diarrhea, Nausea, Vomiting,  and other system side effects that include  Fatigue, headaches, weight loss and other side effects such as skin rash. These will be monitored with  blood work such as LFT initially once a month for 6 months and then quarterly   -He did have an echocardiogram and there is a sharp increase in pulmonary artery systolic pressure.  He is only had a left heart catheterization but is not had right heart catheterization.  This echo was done 12/07/2022 and compared to February 2024.  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  Currently dyspnea is the main symptom and he wants relief from this.  This would require right heart catheterization.  This could be offered under insurance of if he prescreen passes the manufacture of inhaled treprostinil sponsoring a study called [employer] (Dr. Vilma Meckel is principal investigator].  I spoke about the concept of research study.  Patient is very interested in the study.  Gave him a copy of the consent to review and to reflect.  Did touch base with the study coordinator [facility] who will prescreen the patient.  I also reached out to Dr. Nanetta Batty to keep him in the loop and to ensure his alignment which would be critical for the patient.   -In terms of bronchoscopy: This is to rule out opportunistic infections in the setting of Rituxan and recent hospitalization.  I believe the risk is lower now.  Did indicate to the patient that I would like to complete workup of pulmonary hypertension and also get him started on antifibrotic's before we readdress bronchoscopy.  He is agreeable with this plan.   SYMPTOM SCALE - ILD 11/04/2022   Current weight    O2 use ra   Shortness of Breath 0 -> 5 scale with 5 being worst (score 6 If unable to do)   At rest 0   Simple tasks - showers, clothes change, eating, shaving 4   Household (dishes, doing bed, laundry) 4   Shopping 4   Walking level at  own pace 2   Walking up Stairs 4   Total (30-36) Dyspnea Score 18       Non-dyspnea symptoms (0-> 5 scale) 11/04/2022  How bad is your cough? 1  How bad is your fatigue 4  How bad is nausea Not bad  How bad is vomiting?  Not bad  How bad is  diarrhea? 0  How bad is anxiety? 0  How bad is depression 0  Any chronic pain - if so where and how bad 0    Simple office walk 224 (66+46 x 2) feet Pod A at [facility] x  3 laps goal with forehead probe 11/04/2022  12/21/2022   O2 used ra   Number laps completed 2 of 3   Comments about pace sav   Resting Pulse Ox/HR 99% and 60/min 96% RA At rest  Final Pulse Ox/HR 90% and 85/min   Desaturated </= 88% no   Desaturated <= 3% points yes   Got Tachycardic >/= 90/min no   Symptoms at end of test Dyspnea/faitgue   Miscellaneous comments x     CT Chest data from date: *11?124  - personally visualized and independently interpreted : YES - my findings are: UIP  IMPRESSION:   Lungs/Pleura: Diffuse interstitial thickening and coarsened ground-glass with traction bronchiectasis/bronchiolectasis, intralobular septal thickening and honeycombing. Findings are more organized than on 09/17/2022 and appear to have a slight craniocaudal gradient. Centrilobular and paraseptal emphysema. No pleural fluid. Airway is unremarkable. Mild air trapping.    1. Pulmonary parenchymal pattern of fibrosis, as detailed above, appears more organized than on 09/17/2022 and may be due to fibrotic hypersensitivity pneumonitis or fibrotic nonspecific interstitial pneumonitis. Findings are suggestive of an alternative diagnosis (not UIP) per consensus guidelines: Diagnosis of Idiopathic Pulmonary Fibrosis: An Official ATS/ERS/JRS/ALAT Clinical Practice Guideline. Am Rosezetta Schlatter Crit Care Med Vol 198, Iss 5, [phone], Sep 04 2016. 2. Cholelithiasis. 3. Punctate left renal stones. 4.  Aortic atherosclerosis (ICD10-I70.0). 5.  Emphysema (ICD10-J43.9).      Electronically Signed   By: Leanna Battles M.D.   On: 11/23/2022 14:11   ECHO IMPRESSIONS     1. Left ventricular ejection fraction, by estimation, is 60 to 65%. The  left ventricle has normal function. The left ventricle has no regional  wall motion abnormalities. Left ventricular diastolic parameters are  consistent with Grade I diastolic  dysfunction (impaired relaxation).   2. Mid systolic notching of the pulmonary artery pressure tracing  consistent with pulmonary hypertension. RVSP estimated at . . Right  ventricular systolic function is normal. The right ventricular size is  mildly enlarged.   3. Left atrial size was moderately dilated.   4. Right atrial size was severely dilated.   5. The mitral valve is normal in structure. Trivial mitral valve  regurgitation. No evidence of mitral stenosis. Moderate mitral annular  calcification.   6. The aortic valve is normal in structure. Aortic valve regurgitation is  not visualized. No aortic stenosis is present.   7. Aortic dilatation noted. There is mild dilatation of the ascending  aorta, measuring 41 mm.   8. The inferior vena cava is normal in size with greater than 50%  respiratory variability, suggesting right atrial pressure of 3 mmHg.   PFT     Latest Ref Rng & Units 11/15/2022    3:23 PM  PFT Results  FVC-Pre L 2.57   FVC-Predicted Pre % 67   FVC-Post L 2.74   FVC-Predicted Post % 72   Pre FEV1/FVC % % 86   Post FEV1/FCV % % 87   FEV1-Pre L 2.22   FEV1-Predicted Pre % 82   FEV1-Post L 2.38   DLCO uncorrected ml/min/mmHg 13.57   DLCO UNC% % 58   DLCO corrected ml/min/mmHg 15.33   DLCO COR %Predicted % 65   DLVA Predicted % 100   TLC  L 4.01   TLC % Predicted % 59   RV % Predicted % 45        LAB RESULTS last 96 hours No results found.  LAB RESULTS last 90 days Recent Results (from the past 2160 hours)  POCT INR     Status: Abnormal   Collection Time: 09/27/22  3:25 PM  Result Value Ref  Range   INR 1.1 (A) 2.0 - 3.0   POC INR    POCT INR     Status: None   Collection Time: 10/01/22  1:50 PM  Result Value Ref Range   INR     POC INR 2.4   POCT INR     Status: None   Collection Time: 10/06/22  3:50 PM  Result Value Ref Range   INR 2.6 2.0 - 3.0   POC INR    POCT INR     Status: Abnormal   Collection Time: 10/13/22  1:54 PM  Result Value Ref Range   INR 6.4 (A) 2.0 - 3.0   POC INR    Protime-INR     Status: Abnormal   Collection Time: 10/13/22  2:17 PM  Result Value Ref Range   INR 5.1 (HH) 0.9 - 1.2    Comment: **Result Repeated** Reference interval is for non-anticoagulated patients. Suggested INR therapeutic range for Vitamin K antagonist therapy:    Standard Dose (moderate intensity                   therapeutic range):       2.0 - 3.0    Higher intensity therapeutic range       2.5 - 3.5    Prothrombin Time 51.2 (H) 9.1 - 12.0 sec  POCT INR     Status: None   Collection Time: 10/20/22 11:17 AM  Result Value Ref Range   INR 2.2 2.0 - 3.0   POC INR    Basic metabolic panel     Status: Abnormal   Collection Time: 10/27/22 10:26 AM  Result Value Ref Range   Sodium 141 135 - 145 mmol/L   Potassium 4.7 3.5 - 5.1 mmol/L   Chloride 106 98 - 111 mmol/L   CO2 26 22 - 32 mmol/L   Glucose, Bld 122 (H) 70 - 99 mg/dL    Comment: Glucose reference range applies only to samples taken after fasting for at least 8 hours.   BUN 22 8 - 23 mg/dL   Creatinine, Ser 1.61 0.61 - 1.24 mg/dL   Calcium 9.2 8.9 - 09.6 mg/dL   GFR, Estimated >04 >54 mL/min    Comment: (NOTE) Calculated using the CKD-EPI Creatinine Equation (2021)    Anion gap 9 5 - 15    Comment: Performed at [facility], [address], [place], [place] [address]  Magnesium     Status: None   Collection Time: 10/27/22 10:26 AM  Result Value Ref Range   Magnesium 2.2 1.7 - 2.4 mg/dL    Comment: Performed at [facility], [address] [place], [place] [address]  POCT INR     Status:  Abnormal   Collection Time: 10/27/22 11:06 AM  Result Value Ref Range   INR 1.5 (A) 2.0 - 3.0   POC INR    POCT INR     Status: Abnormal   Collection Time: 11/02/22 11:35 AM  Result Value Ref Range   INR 1.3 (A) 2.0 - 3.0   POC INR  B Nat Peptide     Status: Abnormal   Collection Time: 11/04/22  3:19 PM  Result Value Ref Range   Pro B Natriuretic peptide (BNP) 416.0 (H) 0.0 - 100.0 pg/mL  QuantiFERON-TB Gold Plus     Status: None   Collection Time: 11/04/22  3:19 PM  Result Value Ref Range   QuantiFERON-TB Gold Plus NEGATIVE NEGATIVE    Comment: Negative test result. M. tuberculosis complex  infection unlikely.    NIL 0.03 IU/mL   Mitogen-NIL 5.23 IU/mL   TB1-NIL 0.00 IU/mL   TB2-NIL 0.00 IU/mL    Comment: . The Nil tube value reflects the background interferon gamma immune response of the patient's blood sample. This value has been subtracted from the patient's displayed TB and Mitogen results. . Lower than expected results with the Mitogen tube prevent false-negative Quantiferon readings by detecting a patient with a potential immune suppressive condition and/or suboptimal pre-analytical specimen handling. . The TB1 Antigen tube is coated with the M. tuberculosis-specific antigens designed to elicit responses from TB antigen primed CD4+ helper T-lymphocytes. . The TB2 Antigen tube is coated with the M. tuberculosis-specific antigens designed to elicit responses from TB antigen primed CD4+ helper and CD8+ cytotoxic T-lymphocytes. . For additional information, please refer to https://education.[facility].com/faq/FAQ204 (This link is being provided for informational/ educational purposes only.) .   Glucose 6 phosphate dehydrogenase     Status: Abnormal   Collection Time: 11/04/22  3:19 PM  Result Value Ref Range   G-6PDH 21.1 (H) 7.0 - 20.5 U/g Hgb  POCT INR     Status: Abnormal   Collection Time: 11/09/22  2:06 PM  Result Value Ref Range   INR 3.3  (A) 2.0 - 3.0   POC INR    Pulmonary function test     Status: None   Collection Time: 11/15/22  3:23 PM  Result Value Ref Range   FVC-Pre 2.57 L   FVC-%Pred-Pre 67 %   FVC-Post 2.74 L   FVC-%Pred-Post 72 %   FVC-%Change-Post 6 %   FEV1-Pre 2.22 L   FEV1-%Pred-Pre 82 %   FEV1-Post 2.38 L   FEV1-%Pred-Post 88 %   FEV1-%Change-Post 7 %   FEV6-Pre 2.51 L   FEV6-%Pred-Pre 71 %   FEV6-Post 2.71 L   FEV6-%Pred-Post 76 %   FEV6-%Change-Post 7 %   Pre FEV1/FVC ratio 86 %   FEV1FVC-%Pred-Pre 120 %   Post FEV1/FVC ratio 87 %   FEV1FVC-%Change-Post 0 %   Pre FEV6/FVC Ratio 100 %   FEV6FVC-%Pred-Pre 107 %   Post FEV6/FVC ratio 100 %   FEV6FVC-%Pred-Post 107 %   FEF 25-75 Pre 2.97 L/sec   FEF2575-%Pred-Pre 159 %   FEF 25-75 Post 3.97 L/sec   FEF2575-%Pred-Post 213 %   FEF2575-%Change-Post 33 %   RV 1.16 L   RV % pred 45 %   TLC 4.01 L   TLC % pred 59 %   DLCO unc 13.57 ml/min/mmHg   DLCO unc % pred 58 %   DLCO cor 15.33 ml/min/mmHg   DLCO cor % pred 65 %   DL/VA 0.86 ml/min/mmHg/L   DL/VA % pred 578 %  POCT INR     Status: None   Collection Time: 11/23/22  1:41 PM  Result Value Ref Range   INR 2.6 2.0 - 3.0   POC INR    ECHOCARDIOGRAM COMPLETE     Status: None   Collection Time: 12/07/22  4:17 PM  Result Value Ref Range   Area-P  1/2 3.47 cm2   S' Lateral 3.60 cm   AV Area mean vel 2.44 cm2   AR max vel 2.48 cm2   AV Area VTI 2.51 cm2   MV VTI 2.87 cm2   Ao pk vel 1.80 m/s   AV Mean grad 7.0 mmHg   AV Peak grad 13.0 mmHg   Est EF 60 - 65%   POCT INR     Status: None   Collection Time: 12/20/22 10:00 AM  Result Value Ref Range   INR 2.8 2.0 - 3.0   POC INR           has a past medical history of Aortic aneurysm (HCC) (02/20/2016), Arthritis, Atrial fibrillation (HCC) (11/13/2014), Cancer (HCC), Chronic combined systolic and diastolic heart failure (HCC) (01/06/7251), Coronary artery disease, Depression, Essential hypertension (11/13/2014), Hyperlipidemia,  Hyperlipidemia (11/13/2014), Hypertension, Sleep apnea, and Wegener's granulomatosis (11/18/2017).   reports that he quit smoking about 39 years ago. His smoking use included cigarettes. He has never used smokeless tobacco.  Past Surgical History:  Procedure Laterality Date   CARDIOVERSION N/A 12/06/2014   Procedure: CARDIOVERSION;  Surgeon: Chilton Si, MD;  Location: [facility] ENDOSCOPY;  Service: Cardiovascular;  Laterality: N/A;   CARDIOVERSION N/A 05/26/2017   Procedure: CARDIOVERSION;  Surgeon: Chilton Si, MD;  Location[facility] ENDOSCOPY;  Service: Cardiovascular;  Laterality: N/A;   CARDIOVERSION N/A 04/25/2019   Procedure: CARDIOVERSION;  Surgeon: Thurmon Fair, MD;  Location: [facility] ENDOSCOPY;  Service: Cardiovascular;  Laterality: N/A;   CARDIOVERSION N/A 02/26/2022   Procedure: CARDIOVERSION;  Surgeon: Chilton Si, MD;  Location: [facility] ENDOSCOPY;  Service: Cardiovascular;  Laterality: N/A;   CARDIOVERSION N/A 05/13/2022   Procedure: CARDIOVERSION;  Surgeon: Quintella Reichert, MD;  [facility];  Service: Cardiovascular;  Laterality: N/A;   CARPAL TUNNEL RELEASE     CORONARY ANGIOPLASTY     multiple stents   CORONARY ARTERY BYPASS GRAFT  2006   CORONARY STENT INTERVENTION N/A 09/28/2021   Procedure: CORONARY STENT INTERVENTION;  Surgeon: Runell Gess, MD;  Location: [facility];  Service: Cardiovascular;  Laterality: N/A;  SVG-PDA   EYE SURGERY     right eye-  muscular repair   LEFT HEART CATH AND CORS/GRAFTS ANGIOGRAPHY N/A 09/28/2021   Procedure: LEFT HEART CATH AND CORS/GRAFTS ANGIOGRAPHY;  Surgeon: Runell Gess, MD;  [facility];  Service: Cardiovascular;  Laterality: N/A;   TOTAL HIP ARTHROPLASTY  01/03/2012   Procedure: TOTAL HIP ARTHROPLASTY;  Surgeon: Jacki Cones, MD;  Location: [facility];  Service: Orthopedics;  Laterality: Right;    Allergies  Allergen Reactions   Farxiga [Dapagliflozin] Other (See Comments)     Lightheadedness Low blood pressure   Statins Other (See Comments)    Myalgias     Immunization History  Administered Date(s) Administered   Influenza, High Dose Seasonal PF 10/05/2022   Influenza-Unspecified 11/01/2018   PFIZER(Purple Top)SARS-COV-2 Vaccination 02/10/2019, 03/03/2019, 09/06/2019   Pfizer Covid-19 Vaccine Bivalent Booster 48yrs & up 09/14/2020   Pneumococcal Polysaccharide-23 01/04/2012    Family History  Problem Relation Age of Onset   Heart attack Mother    Other Father        odd stomach disorder   Stroke Maternal Grandmother    Heart attack Maternal Grandfather    Heart attack Paternal Grandmother    Heart attack Paternal Grandfather      Current Outpatient Medications:    ALPRAZolam (XANAX) 0.5 MG tablet, Take 0.5 mg by mouth at bedtime.  Takes at 11:00pm, Disp: , Rfl:    amLODipine (NORVASC) 2.5 MG tablet, Take 2.5 mg by mouth daily. Takes at 6:30am, Disp: , Rfl:    Budeson-Glycopyrrol-Formoterol (BREZTRI AEROSPHERE) 160-9-4.8 MCG/ACT AERO, Inhale 2 puffs into the lungs in the morning and at bedtime., Disp: , Rfl:    carvedilol (COREG) 25 MG tablet, Take 0.5 tablets (12.5 mg total) by mouth 2 (two) times daily., Disp: , Rfl:    CHELATED ZINC PO, Take 1 tablet by mouth daily., Disp: , Rfl:    Cholecalciferol (VITAMIN D-3 PO), Take 1 capsule by mouth daily., Disp: , Rfl:    clopidogrel (PLAVIX) 75 MG tablet, Take 75 mg by mouth daily., Disp: , Rfl:    Coenzyme Q10 (COQ10 PO), Take 1 capsule by mouth daily., Disp: , Rfl:    dofetilide (TIKOSYN) 500 MCG capsule, TAKE 1 CAPSULE BY MOUTH 2 TIMES DAILY., Disp: 180 capsule, Rfl: 1   Evolocumab (REPATHA SURECLICK) 140 MG/ML SOAJ, INJECT 140 MG INTO THE SKIN EVERY 14 (FOURTEEN) DAYS. INJECT 1 PEN INTO THE FATTY TISSUE SKIN EVERY 14 DAY, Disp: 6 mL, Rfl: 3   famotidine (PEPCID) 20 MG tablet, Take 1 tablet (20 mg total) by mouth 2 (two) times daily., Disp: , Rfl:    ferrous sulfate 325 (65 FE) MG EC tablet, Take 325 mg  by mouth every Monday, Wednesday, and Friday., Disp: , Rfl:    furosemide (LASIX) 40 MG tablet, Take 1 tablet (40 mg total) by mouth daily as needed., Disp: 30 tablet, Rfl: 3   KLOR-CON M20 20 MEQ tablet, TAKE 2 TABLETS BY MOUTH DAILY (Patient taking differently: Take 20 mEq by mouth daily.), Disp: 180 tablet, Rfl: 1   levothyroxine (SYNTHROID, LEVOTHROID) 137 MCG tablet, Take 137 mcg by mouth daily before breakfast., Disp: , Rfl:    loratadine (CLARITIN) 10 MG tablet, Take 10 mg by mouth daily., Disp: , Rfl:    metFORMIN (GLUCOPHAGE) 500 MG tablet, Take 500 mg by mouth 2 (two) times daily., Disp: , Rfl: 12   Multiple Vitamins-Minerals (CENTRUM SILVER 50+MEN) TABS, Take 1 tablet by mouth daily., Disp: , Rfl:    nitroGLYCERIN (NITROSTAT) 0.4 MG SL tablet, Place 1 tablet (0.4 mg total) under the tongue every 5 (five) minutes x 3 doses as needed for chest pain., Disp: 25 tablet, Rfl: 12   pantoprazole (PROTONIX) 40 MG tablet, Take 40 mg by mouth daily., Disp: , Rfl:    prednisoLONE 5 MG TABS tablet, Tapered dose 20 mg daily, Disp: , Rfl:    riTUXimab (RITUXAN IV), Inject 1 application  into the vein See admin instructions. Take every two weeks and every six months alternating, Disp: , Rfl:    Semaglutide (OZEMPIC, 0.25 OR 0.5 MG/DOSE, Vaughn), Inject 0.5 mg into the skin once a week., Disp: , Rfl:    testosterone cypionate (DEPOTESTOSTERONE CYPIONATE) 200 MG/ML injection, Inject 150 mg into the muscle every 14 (fourteen) days., Disp: , Rfl:    venlafaxine XR (EFFEXOR-XR) 150 MG 24 hr capsule, Take 150 mg by mouth daily., Disp: , Rfl:    warfarin (COUMADIN) 5 MG tablet, TAKE 1/2 TABLET TO 1 TABLET BY MOUTH DAILY AS DIRECTED BY COUMADIN CLINIC, Disp: 70 tablet, Rfl: 1      Objective:   Vitals:   12/21/22 0955 12/21/22 1037  BP: 134/79   Pulse: 62   SpO2: 95% 96%  Weight: 223 lb (101.2 kg)   Height: 5' 8.5" (1.74 m)     Estimated body mass index is  33.41 kg/m as calculated from the following:    Height as of this encounter: 5' 8.5" (1.74 m).   Weight as of this encounter: 223 lb (101.2 kg).  @WEIGHTCHANGE @  [employer]   12/21/22 0955  Weight: 223 lb (101.2 kg)     Physical Exam   General: No distress. Mild cushingiod O2 at rest: yes but 96% RA rest Cane present: no Sitting in wheel chair: no Frail: yes Obese: yes Neuro: Alert and Oriented x 3. GCS 15. Speech normal Psych: Pleasant Resp:  Barrel Chest - no.  Wheeze - no, Crackles - YES SOME BASE, No overt respiratory distress CVS: Normal heart sounds. Murmurs - no Ext: Stigmata of Connective Tissue Disease - no HEENT: Normal upper airway. PEERL +. No post nasal drip        Assessment:       ICD-10-CM   1. ILD (interstitial lung disease) (HCC)  J84.9     2. 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 Chronic respiratory failure with hypoxia (HCC)  J96.11     4. High risk medication use  Z79.899     5. 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 Immunosuppressed status (HCC)  D84.9     7. Dyspnea, unspecified type  R06.00          Plan:     Patient Instructions     ICD-10-CM   1. ILD (interstitial lung disease) (HCC)  J84.9     2. 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 Chronic respiratory failure with hypoxia (HCC)  J96.11     4. High risk medication use  Z79.899     5. 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 Immunosuppressed status (HCC)  D84.9     7. Dyspnea, unspecified type  R06.00       In 2008 you had clean lungs on the CT lung field.  However as of February 2024 he developed pulmonary inflammation in the background of Wegener's for the last 5 years and Rituxan therapy for the last 5 years resulting in hospitalization September 2024 with respiratory failure.  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 about the need for right heart catheterization  -For concern for opportunistic infection   - Sorry cardiology clearance came late but at this point in time we will hold off on bronchoscopy till you complete right heart catheterization procedures  = Risk for opportunistic infection has diminished but not excluded   -Meanwhile  - continue oxygen therapy at night and with exertion to keep your pulse ox greater than 88% - continue prednisone taper outline prior - [name] - continue rituxan per [name]   Follow-up - 6-weeks 30-minute visit but after completing all of the above; face-to-face  ideally if not video visit    FOLLOWUP Return in about 6 weeks (around 02/01/2023) for 30 min visit, ILD, with Dr Marchelle [name], with any of the APPS, Face to Face OR Video Visit.    SIGNATURE    Dr. Kalman Shan, M.D., F.C.C.P,  Pulmonary and Critical Care Medicine Staff Physician, [facility] Center Director - Interstitial Lung Disease  Program  Pulmonary Fibrosis [facility] at [facility] [place], [place], [address]  Pager: [phone], If no answer or between  15:00h - 7:00h: call [phone] Telephone: [phone]  10:46 AM 12/21/2022     HIGh Complexity  OFFICE   2021 E/M guidelines, first released in 2021, with minor revisions added in 2023. Must meet the requirements for 2 out of 3 dimensions to qualify.     Number and complexity of problems addressed Amount and/or complexity of data reviewed Risk of complications and/or morbidity  Severe exacerbation of chronic illness  Acute or chronic illnesses that may pose a threat to life or bodily function, e.g., multiple trauma, acute MI, pulmonary embolus, severe respiratory distress, progressive rheumatoid arthritis, psychiatric illness with potential threat to self or others, peritonitis, acute renal failure, abrupt change in neurological status Must meet the requirements for 2 of 3 of the categories)  Category 1: Tests and documents, historian  Any combination of 3 of the following:  Assessment requiring an independent historian  Review of prior external note(s) from each unique source - left heart cath  Review of results of each unique test  Ordering of each unique test    Category 2: Interpretation of tests    Independent interpretation of a test performed by another physician/other qualified health care professional (not separately reported)  Category 3: Discuss management/tests  Discussion of management or test interpretation with external physician/other qualified health care professional/appropriate source (not separately reported) -  HIGH risk of morbidity from additional diagnostic testing or treatment Examples only:  Drug therapy requiring intensive monitoring for toxicity  Decision for elective major surgery with identified pateint or procedure risk factors  Decision regarding hospitalization or escalation of level of care  Decision for DNR or to de-escalate care   Parenteral controlled  substances

## 2022-12-20 NOTE — Patient Instructions (Addendum)
ICD-10-CM   1. ILD (interstitial lung disease) (HCC)  J84.9     2. 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 Chronic respiratory failure with hypoxia (HCC)  J96.11     4. High risk medication use  Z79.899     5. 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 Immunosuppressed status (HCC)  D84.9     7. Dyspnea, unspecified type  R06.00       In 2008 you had clean lungs on the CT lung field.  However as of February 2024 he developed pulmonary inflammation in the background of Wegener's for the last 5 years and Rituxan therapy for the last 5 years resulting in hospitalization September 2024 with respiratory failure.  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 about the need for right heart catheterization  -For concern for opportunistic infection   - Sorry cardiology clearance came late but at this point in time we will hold off on bronchoscopy till you complete right heart catheterization procedures  = Risk for opportunistic infection has diminished but not excluded   -Meanwhile  - continue oxygen therapy at night and with exertion to keep your pulse ox greater than 88% - continue prednisone taper outline prior - [name] - continue rituxan per [name]   Follow-up - 6-weeks 30-minute visit  but after completing all of the above; face-to-face ideally if not video visit

## 2022-12-21 ENCOUNTER — Telehealth: Payer: Self-pay | Admitting: Internal Medicine

## 2022-12-21 ENCOUNTER — Encounter: Payer: Self-pay | Admitting: Internal Medicine

## 2022-12-21 ENCOUNTER — Ambulatory Visit (INDEPENDENT_AMBULATORY_CARE_PROVIDER_SITE_OTHER): Payer: Medicare Other | Admitting: Internal Medicine

## 2022-12-21 VITALS — BP 134/79 | HR 62 | Ht 68.5 in | Wt 223.0 lb

## 2022-12-21 DIAGNOSIS — R0600 Dyspnea, unspecified: Secondary | ICD-10-CM

## 2022-12-21 DIAGNOSIS — J849 Interstitial pulmonary disease, unspecified: Secondary | ICD-10-CM | POA: Diagnosis not present

## 2022-12-21 DIAGNOSIS — D849 Immunodeficiency, unspecified: Secondary | ICD-10-CM

## 2022-12-21 DIAGNOSIS — J84112 Idiopathic pulmonary fibrosis: Secondary | ICD-10-CM

## 2022-12-21 DIAGNOSIS — J9611 Chronic respiratory failure with hypoxia: Secondary | ICD-10-CM

## 2022-12-21 DIAGNOSIS — M3130 Wegener's granulomatosis without renal involvement: Secondary | ICD-10-CM

## 2022-12-21 DIAGNOSIS — Z79899 Other long term (current) drug therapy: Secondary | ICD-10-CM | POA: Diagnosis not present

## 2022-12-21 MED ORDER — BREZTRI AEROSPHERE 160-9-4.8 MCG/ACT IN AERO
2.0000 | INHALATION_SPRAY | Freq: Two times a day (BID) | RESPIRATORY_TRACT | Status: DC
Start: 2022-12-21 — End: 2022-12-30

## 2022-12-21 NOTE — Telephone Encounter (Signed)
Devki/Annika  [name]' -new pirfenidone start.  Please make sure this paperwork

## 2022-12-21 NOTE — Telephone Encounter (Signed)
Dear Gary [name],  Happy holidays  Gary [name] -he has ILD with honeycombing.  This is prediction for future progression.  On his most recent echo there is elevated pulmonary artery pressures.  I believe right heart catheterization is indicated.  If he has WHO group 3 pulmonary hypertension then he qualifies for inhaled treprostinil which can improve dyspnea.  I have given him a consent form for a research study sponsored by the manufacture of inhaled treprostinil that offers SAME right heart catheterization through research.  If this is the case and he qualifies for the study Gary [name] and Gary [name] of the only research credentialed proceduralist.  He is interested in this research  On the other hand he could just have it standard of care through you   He wanted to make sure I communicated all this with you   Thanks    SIGNATURE    Dr. Kalman Shan, M.D., F.C.C.P,  Pulmonary and Critical Care Medicine Staff Physician, [facility] Center Director - Interstitial Lung Disease  Program  Pulmonary Fibrosis [facility] Network at [facility] [place], [place], [address]   Pager: [phone], If no answer  -> Check AMION or Try [phone] Telephone (clinical office): [phone] Telephone (research): [phone]  10:45 AM 12/21/2022

## 2022-12-21 NOTE — Telephone Encounter (Signed)
Gary Ann  [name] [name] - prescreen for [facility] and LMK. IF he is prescreen fails, I have to let CHF team know to do RHC via [facility]   Thjanks  MR

## 2022-12-22 NOTE — Telephone Encounter (Signed)
Gary [name]   Gary [name] has echo showing PASP > 50. Does not qualify for Phinder study till end of feb 2025 due to EF < 50%. I think he needs RHC. Dr Allyson Sabal is fine with you guys doing RHC as standard of care.   Please faciliate  Thanks  MR

## 2022-12-23 ENCOUNTER — Ambulatory Visit: Payer: Self-pay

## 2022-12-24 ENCOUNTER — Telehealth: Payer: Self-pay | Admitting: Pharmacist

## 2022-12-24 DIAGNOSIS — J849 Interstitial pulmonary disease, unspecified: Secondary | ICD-10-CM

## 2022-12-24 DIAGNOSIS — J84112 Idiopathic pulmonary fibrosis: Secondary | ICD-10-CM

## 2022-12-24 NOTE — Patient Instructions (Signed)
Visit Information  Thank you for taking time to visit with me today. Please don't hesitate to contact me if I can be of assistance to you.   Following are the goals we discussed today:   Goals Addressed             This Visit's Progress    To discuss my lung status with my PCP       Care Coordination Interventions: Evaluation of current treatment plan related to  Wegener's granulomatosis without renal involvement, dyspnea, Interstitial Lung Disease and patient's adherence to plan as established by provider Determined patient completed his MD follow up with [name], Pulmonologist Review of patient status, including review of consultant's reports, relevant laboratory and other test results, and medications completed Plan -For pulmonary fibrosis             -Start pirfenidone per protocol; need LFT chest in 6 weeks             -Keep nintedanib as second line because of anticoagulation history and cardiac history -For concern for pulmonary hypertension             - Take consent form for PHINDER study ; and [name] from [employer] research will be in touch with you over the next few to several weeks to see if you qualify             -Also sending a message to [name] about the need for right heart catheterization -For concern for opportunistic infection              - Sorry cardiology clearance came late but at this point in time we will hold off on bronchoscopy till you complete right heart catheterization procedures             = Risk for opportunistic infection has diminished but not excluded -Meanwhile  - continue oxygen therapy at night and with exertion to keep your pulse ox greater than 88% - continue prednisone taper outline prior - [name] - continue rituxan per [name] Follow-up - 6-weeks 30-minute visit but after completing all of the above; face-to-face ideally if not video visit Determined patient has a good understanding of his prescribed treatment plan   Instructed patient to notify his doctor of new symptoms or concerns promptly Discussed plans with patient for ongoing care coordination follow up and provided patient with direct contact information for nurse care coordinator             Our next appointment is by telephone on 01/13/23 at 09:00 AM  Please call the care guide team at [phone] if you need to cancel or reschedule your appointment.   If you are experiencing a Mental Health or Behavioral Health Crisis or need someone to talk to, please call 1-800-273-TALK (toll free, 24 hour hotline)  Patient verbalizes understanding of instructions and care plan provided today and agrees to view in MyChart. Active MyChart status and patient understanding of how to access instructions and care plan via MyChart confirmed with patient.     Delsa Sale RN BSN CCM [place]  [facility], [facility] Nurse Care Coordinator  Direct Dial: [phone] Website: [email or web address]

## 2022-12-24 NOTE — Patient Outreach (Signed)
Care Coordination   Follow Up Visit Note   12/24/2022 Name: Gary [name] MRN: [id number] DOB: [birth year]  Gary [name] is a 77 y.o. year old male who sees Charlane Ferretti, [place] for primary care. I spoke with  Gary [name] by phone today.  What matters to the patients health and wellness today?  Patient would like to get approval to start a new medication for his ILD as discussed with his doctor.     Goals Addressed             This Visit's Progress    To discuss my lung status with my PCP       Care Coordination Interventions: Evaluation of current treatment plan related to  Wegener's granulomatosis without renal involvement, dyspnea, Interstitial Lung Disease and patient's adherence to plan as established by provider Determined patient completed his MD follow up with Gary. Marchelle [name], Pulmonologist Review of patient status, including review of consultant's reports, relevant laboratory and other test results, and medications completed Plan -For pulmonary fibrosis             -Start pirfenidone per protocol; need LFT chest in 6 weeks             -Keep nintedanib as second line because of anticoagulation history and cardiac history -For concern for pulmonary hypertension             - Take consent form for PHINDER study ; and [name] from [employer] research will be in touch with you over the next few to several weeks to see if you qualify             -Also sending a message to Gary [name] about the need for right heart catheterization -For concern for opportunistic infection              - Sorry cardiology clearance came late but at this point in time we will hold off on bronchoscopy till you complete right heart catheterization procedures             = Risk for opportunistic infection has diminished but not excluded -Meanwhile  - continue oxygen therapy at night and with exertion to keep your pulse ox greater than 88% - continue prednisone taper outline prior - Gary  Dierdre [name] - continue rituxan per Gary [name] Follow-up - 6-weeks 30-minute visit but after completing all of the above; face-to-face ideally if not video visit Determined patient has a good understanding of his prescribed treatment plan  Instructed patient to notify his doctor of new symptoms or concerns promptly Discussed plans with patient for ongoing care coordination follow up and provided patient with direct contact information for nurse care coordinator         Interventions Today    Flowsheet Row Most Recent Value  Chronic Disease   Chronic disease during today's visit Other, Atrial Fibrillation (AFib)  [ILD (interstitial lung disease)]  General Interventions   General Interventions Discussed/Reviewed General Interventions Discussed, General Interventions Reviewed, Doctor Visits, Durable Medical Equipment (DME)  Doctor Visits Discussed/Reviewed Doctor Visits Discussed, Doctor Visits Reviewed, Specialist  Durable Medical Equipment (DME) Oxygen  Exercise Interventions   Exercise Discussed/Reviewed Physical Activity  Physical Activity Discussed/Reviewed Physical Activity Reviewed, Physical Activity Discussed  Education Interventions   Education Provided Provided Education  Provided Verbal Education On When to see the doctor, Medication, Exercise  Pharmacy Interventions   Pharmacy Dicussed/Reviewed Pharmacy Topics Reviewed, Pharmacy Topics Discussed, Medications and their functions  SDOH assessments and interventions completed:  No     Care Coordination Interventions:  Yes, provided   Follow up plan: Follow up call scheduled for 01/13/23 @09 :00 AM    Encounter Outcome:  Patient Visit Completed

## 2022-12-24 NOTE — Telephone Encounter (Signed)
Patient is new start to pirfenidone  Submitted a Prior Authorization request to [facility] for PIRFENIDONE via CoverMyMeds. Will update once we receive a response.  Key: Kathline Magic, PharmD, MPH, BCPS, CPP Clinical Pharmacist (Rheumatology and Pulmonology)

## 2022-12-24 NOTE — Telephone Encounter (Signed)
Pharmacy team will start benefits investigation ofr pirfenidone  [name], PharmD, MPH, BCPS, CPP Clinical Pharmacist (Rheumatology and Pulmonology)

## 2022-12-27 ENCOUNTER — Telehealth (HOSPITAL_COMMUNITY): Payer: Self-pay

## 2022-12-27 DIAGNOSIS — I5042 Chronic combined systolic (congestive) and diastolic (congestive) heart failure: Secondary | ICD-10-CM

## 2022-12-27 NOTE — Telephone Encounter (Signed)
Left message for patient to call office to arrange right heart catheterization.

## 2022-12-28 ENCOUNTER — Other Ambulatory Visit (HOSPITAL_COMMUNITY): Payer: Self-pay

## 2022-12-28 ENCOUNTER — Encounter (HOSPITAL_COMMUNITY): Payer: Self-pay | Admitting: Cardiology

## 2022-12-28 NOTE — Telephone Encounter (Signed)
Pt aware and voiced understanding -copy of instructions sent via mychart

## 2022-12-28 NOTE — Addendum Note (Signed)
Addended by: Yusra Ravert, Milagros Reap on: 12/28/2022 09:09 AM   Modules accepted: Orders

## 2022-12-28 NOTE — Telephone Encounter (Addendum)
  [facility] [address] [place] [address] Dept: [phone] Loc: [phone]  DAUNDRE HUNTSBERGER  12/28/2022  You are scheduled for a Cardiac Catheterization on Monday, January 6 with Dr.  Theresia Bough .  1. Please arrive at the [facility] (Main Entrance A) at [facility]: [address] [place], [place] [address] at 7:00 AM (This time is 2 hour(s) before your procedure to ensure your preparation).   Free valet parking service is available. You will check in at ADMITTING. The support person will be asked to wait in the waiting room.  It is OK to have someone drop you off and come back when you are ready to be discharged.    Special note: Every effort is made to have your procedure done on time. Please understand that emergencies sometimes delay scheduled procedures.  2. Diet: Do not eat solid foods after midnight.  The patient may have clear liquids until 5am upon the day of the procedure.  3. Labs: You will need to have blood drawn on Friday, January 3 at 9am- [facility]. You do not need to be fasting. Entrance C Heart and Vascular   4. Medication instructions in preparation for your procedure:   Contrast Allergy: No   Per Dr Elwyn Lade No need to hold coumadin  Stop taking, Lasix (Furosemide)  Monday, January 6,  HOLD Ozempic the week prior to procedure Wed 01/05/23  Do not take Diabetes Med Glucophage (Metformin) on the day of the procedure and HOLD 48 HOURS AFTER THE PROCEDURE.  On the morning of your procedure, take your Aspirin 81 mg and any morning medicines NOT listed above.  You may use sips of water.  5. Plan to go home the same day, you will only stay overnight if medically necessary. 6. Bring a current list of your medications and current insurance cards. 7. You MUST have a responsible person to drive you home. 8. Someone MUST be with you the first 24 hours  after you arrive home or your discharge will be delayed. 9. Please wear clothes that are easy to get on and off and wear slip-on shoes.  Thank you for allowing [place] to care for you!   -- [place] [facility]

## 2022-12-28 NOTE — Telephone Encounter (Signed)
Received notification from [facility] regarding a prior authorization for PIRFENIDONE. Authorization has been APPROVED from 12/24/2022 until further notice. Approval letter sent to scan center.  Per test claim, copay for 30 days supply is $217.77  Patient can fill through [facility]: [phone]   Authorization # [id number]  Can run test claim with new year. Max OOP for 2025 for rx drugs is $2000. Unfortunately there are no open pulmonary fibrosis grants to help cover copays. Patient will not qualify for [employer] PAP due to generic pirfenidone being approved through insurance  [name], PharmD, MPH, BCPS, CPP Clinical Pharmacist (Rheumatology and Pulmonology)

## 2023-01-01 ENCOUNTER — Emergency Department (HOSPITAL_BASED_OUTPATIENT_CLINIC_OR_DEPARTMENT_OTHER): Payer: Medicare Other

## 2023-01-01 ENCOUNTER — Inpatient Hospital Stay (HOSPITAL_BASED_OUTPATIENT_CLINIC_OR_DEPARTMENT_OTHER)
Admission: EM | Admit: 2023-01-01 | Discharge: 2023-01-08 | DRG: 196 | Disposition: A | Discharge status: Home Health | Payer: Medicare Other | Attending: Internal Medicine | Admitting: Internal Medicine

## 2023-01-01 ENCOUNTER — Other Ambulatory Visit: Payer: Self-pay

## 2023-01-01 DIAGNOSIS — R0602 Shortness of breath: Secondary | ICD-10-CM | POA: Diagnosis not present

## 2023-01-01 DIAGNOSIS — R778 Other specified abnormalities of plasma proteins: Secondary | ICD-10-CM | POA: Diagnosis not present

## 2023-01-01 DIAGNOSIS — Z79890 Hormone replacement therapy (postmenopausal): Secondary | ICD-10-CM

## 2023-01-01 DIAGNOSIS — R0989 Other specified symptoms and signs involving the circulatory and respiratory systems: Secondary | ICD-10-CM | POA: Diagnosis not present

## 2023-01-01 DIAGNOSIS — Z823 Family history of stroke: Secondary | ICD-10-CM

## 2023-01-01 DIAGNOSIS — I10 Essential (primary) hypertension: Secondary | ICD-10-CM | POA: Diagnosis not present

## 2023-01-01 DIAGNOSIS — I700 Atherosclerosis of aorta: Secondary | ICD-10-CM | POA: Diagnosis present

## 2023-01-01 DIAGNOSIS — I480 Paroxysmal atrial fibrillation: Secondary | ICD-10-CM | POA: Diagnosis present

## 2023-01-01 DIAGNOSIS — D539 Nutritional anemia, unspecified: Secondary | ICD-10-CM | POA: Diagnosis present

## 2023-01-01 DIAGNOSIS — R7989 Other specified abnormal findings of blood chemistry: Secondary | ICD-10-CM | POA: Diagnosis not present

## 2023-01-01 DIAGNOSIS — J8489 Other specified interstitial pulmonary diseases: Secondary | ICD-10-CM | POA: Diagnosis present

## 2023-01-01 DIAGNOSIS — F411 Generalized anxiety disorder: Secondary | ICD-10-CM | POA: Diagnosis present

## 2023-01-01 DIAGNOSIS — E785 Hyperlipidemia, unspecified: Secondary | ICD-10-CM | POA: Diagnosis present

## 2023-01-01 DIAGNOSIS — R0902 Hypoxemia: Principal | ICD-10-CM

## 2023-01-01 DIAGNOSIS — E119 Type 2 diabetes mellitus without complications: Secondary | ICD-10-CM | POA: Diagnosis not present

## 2023-01-01 DIAGNOSIS — J301 Allergic rhinitis due to pollen: Secondary | ICD-10-CM | POA: Diagnosis not present

## 2023-01-01 DIAGNOSIS — E1151 Type 2 diabetes mellitus with diabetic peripheral angiopathy without gangrene: Secondary | ICD-10-CM | POA: Diagnosis present

## 2023-01-01 DIAGNOSIS — J432 Centrilobular emphysema: Secondary | ICD-10-CM | POA: Diagnosis present

## 2023-01-01 DIAGNOSIS — E039 Hypothyroidism, unspecified: Secondary | ICD-10-CM | POA: Diagnosis not present

## 2023-01-01 DIAGNOSIS — I110 Hypertensive heart disease with heart failure: Secondary | ICD-10-CM | POA: Diagnosis not present

## 2023-01-01 DIAGNOSIS — J9601 Acute respiratory failure with hypoxia: Secondary | ICD-10-CM | POA: Diagnosis not present

## 2023-01-01 DIAGNOSIS — M3130 Wegener's granulomatosis without renal involvement: Secondary | ICD-10-CM | POA: Diagnosis present

## 2023-01-01 DIAGNOSIS — I2723 Pulmonary hypertension due to lung diseases and hypoxia: Secondary | ICD-10-CM | POA: Diagnosis not present

## 2023-01-01 DIAGNOSIS — G4733 Obstructive sleep apnea (adult) (pediatric): Secondary | ICD-10-CM | POA: Diagnosis present

## 2023-01-01 DIAGNOSIS — J9621 Acute and chronic respiratory failure with hypoxia: Secondary | ICD-10-CM | POA: Diagnosis present

## 2023-01-01 DIAGNOSIS — F32A Depression, unspecified: Secondary | ICD-10-CM | POA: Diagnosis not present

## 2023-01-01 DIAGNOSIS — G8929 Other chronic pain: Secondary | ICD-10-CM | POA: Diagnosis present

## 2023-01-01 DIAGNOSIS — E66811 Obesity, class 1: Secondary | ICD-10-CM | POA: Diagnosis present

## 2023-01-01 DIAGNOSIS — I2510 Atherosclerotic heart disease of native coronary artery without angina pectoris: Secondary | ICD-10-CM | POA: Diagnosis present

## 2023-01-01 DIAGNOSIS — Z23 Encounter for immunization: Secondary | ICD-10-CM | POA: Diagnosis not present

## 2023-01-01 DIAGNOSIS — Z87891 Personal history of nicotine dependence: Secondary | ICD-10-CM

## 2023-01-01 DIAGNOSIS — R54 Age-related physical debility: Secondary | ICD-10-CM | POA: Diagnosis present

## 2023-01-01 DIAGNOSIS — I5032 Chronic diastolic (congestive) heart failure: Secondary | ICD-10-CM | POA: Diagnosis not present

## 2023-01-01 DIAGNOSIS — I5082 Biventricular heart failure: Secondary | ICD-10-CM | POA: Diagnosis not present

## 2023-01-01 DIAGNOSIS — Z7952 Long term (current) use of systemic steroids: Secondary | ICD-10-CM

## 2023-01-01 DIAGNOSIS — R609 Edema, unspecified: Secondary | ICD-10-CM | POA: Diagnosis not present

## 2023-01-01 DIAGNOSIS — Z79899 Other long term (current) drug therapy: Secondary | ICD-10-CM

## 2023-01-01 DIAGNOSIS — R590 Localized enlarged lymph nodes: Secondary | ICD-10-CM | POA: Diagnosis not present

## 2023-01-01 DIAGNOSIS — Z96641 Presence of right artificial hip joint: Secondary | ICD-10-CM | POA: Diagnosis present

## 2023-01-01 DIAGNOSIS — Z9981 Dependence on supplemental oxygen: Secondary | ICD-10-CM

## 2023-01-01 DIAGNOSIS — R6510 Systemic inflammatory response syndrome (SIRS) of non-infectious origin without acute organ dysfunction: Secondary | ICD-10-CM | POA: Diagnosis present

## 2023-01-01 DIAGNOSIS — Z951 Presence of aortocoronary bypass graft: Secondary | ICD-10-CM

## 2023-01-01 DIAGNOSIS — M069 Rheumatoid arthritis, unspecified: Secondary | ICD-10-CM | POA: Diagnosis present

## 2023-01-01 DIAGNOSIS — J849 Interstitial pulmonary disease, unspecified: Secondary | ICD-10-CM | POA: Diagnosis present

## 2023-01-01 DIAGNOSIS — Z7985 Long-term (current) use of injectable non-insulin antidiabetic drugs: Secondary | ICD-10-CM

## 2023-01-01 DIAGNOSIS — J84112 Idiopathic pulmonary fibrosis: Principal | ICD-10-CM | POA: Diagnosis present

## 2023-01-01 DIAGNOSIS — J9600 Acute respiratory failure, unspecified whether with hypoxia or hypercapnia: Secondary | ICD-10-CM | POA: Diagnosis not present

## 2023-01-01 DIAGNOSIS — Z8701 Personal history of pneumonia (recurrent): Secondary | ICD-10-CM

## 2023-01-01 DIAGNOSIS — Z955 Presence of coronary angioplasty implant and graft: Secondary | ICD-10-CM

## 2023-01-01 DIAGNOSIS — D509 Iron deficiency anemia, unspecified: Secondary | ICD-10-CM | POA: Diagnosis present

## 2023-01-01 DIAGNOSIS — Z888 Allergy status to other drugs, medicaments and biological substances status: Secondary | ICD-10-CM

## 2023-01-01 DIAGNOSIS — D72829 Elevated white blood cell count, unspecified: Secondary | ICD-10-CM | POA: Diagnosis present

## 2023-01-01 DIAGNOSIS — Z6830 Body mass index (BMI) 30.0-30.9, adult: Secondary | ICD-10-CM

## 2023-01-01 DIAGNOSIS — Z1152 Encounter for screening for COVID-19: Secondary | ICD-10-CM | POA: Diagnosis not present

## 2023-01-01 DIAGNOSIS — J438 Other emphysema: Secondary | ICD-10-CM | POA: Diagnosis present

## 2023-01-01 DIAGNOSIS — Z7901 Long term (current) use of anticoagulants: Secondary | ICD-10-CM

## 2023-01-01 DIAGNOSIS — I272 Pulmonary hypertension, unspecified: Secondary | ICD-10-CM | POA: Diagnosis not present

## 2023-01-01 DIAGNOSIS — Z8249 Family history of ischemic heart disease and other diseases of the circulatory system: Secondary | ICD-10-CM

## 2023-01-01 DIAGNOSIS — Z7984 Long term (current) use of oral hypoglycemic drugs: Secondary | ICD-10-CM

## 2023-01-01 DIAGNOSIS — Z8679 Personal history of other diseases of the circulatory system: Secondary | ICD-10-CM

## 2023-01-01 DIAGNOSIS — Z7902 Long term (current) use of antithrombotics/antiplatelets: Secondary | ICD-10-CM

## 2023-01-01 DIAGNOSIS — B59 Pneumocystosis: Secondary | ICD-10-CM | POA: Diagnosis not present

## 2023-01-01 DIAGNOSIS — Z7982 Long term (current) use of aspirin: Secondary | ICD-10-CM

## 2023-01-01 DIAGNOSIS — R918 Other nonspecific abnormal finding of lung field: Secondary | ICD-10-CM | POA: Diagnosis not present

## 2023-01-01 DIAGNOSIS — J309 Allergic rhinitis, unspecified: Secondary | ICD-10-CM | POA: Diagnosis present

## 2023-01-01 HISTORY — DX: Interstitial pulmonary disease, unspecified: J84.9

## 2023-01-01 LAB — BRAIN NATRIURETIC PEPTIDE: B Natriuretic Peptide: 513.8 pg/mL — ABNORMAL HIGH (ref 0.0–100.0)

## 2023-01-01 LAB — CBC
HCT: 35.9 % — ABNORMAL LOW (ref 39.0–52.0)
Hemoglobin: 10.6 g/dL — ABNORMAL LOW (ref 13.0–17.0)
MCH: 24.1 pg — ABNORMAL LOW (ref 26.0–34.0)
MCHC: 29.5 g/dL — ABNORMAL LOW (ref 30.0–36.0)
MCV: 81.8 fL (ref 80.0–100.0)
Platelets: 455 10*3/uL — ABNORMAL HIGH (ref 150–400)
RBC: 4.39 MIL/uL (ref 4.22–5.81)
RDW: 19.2 % — ABNORMAL HIGH (ref 11.5–15.5)
WBC: 13.8 10*3/uL — ABNORMAL HIGH (ref 4.0–10.5)
nRBC: 0.1 % (ref 0.0–0.2)

## 2023-01-01 LAB — BASIC METABOLIC PANEL
Anion gap: 11 (ref 5–15)
BUN: 20 mg/dL (ref 8–23)
CO2: 25 mmol/L (ref 22–32)
Calcium: 10 mg/dL (ref 8.9–10.3)
Chloride: 106 mmol/L (ref 98–111)
Creatinine, Ser: 0.91 mg/dL (ref 0.61–1.24)
GFR, Estimated: >60 mL/min (ref >60)
Glucose, Bld: 125 mg/dL — ABNORMAL HIGH (ref 70–99)
Potassium: 4.3 mmol/L (ref 3.5–5.1)
Sodium: 142 mmol/L (ref 135–145)

## 2023-01-01 LAB — TROPONIN I (HIGH SENSITIVITY)
Troponin I (High Sensitivity): 32 ng/L — ABNORMAL HIGH (ref <18)
Troponin I (High Sensitivity): 75 ng/L — ABNORMAL HIGH (ref <18)
Troponin I (High Sensitivity): 104 ng/L (ref <18)

## 2023-01-01 LAB — PROTIME-INR
INR: 2.4 — ABNORMAL HIGH (ref 0.8–1.2)
Prothrombin Time: 26.5 s — ABNORMAL HIGH (ref 11.4–15.2)

## 2023-01-01 LAB — CBG MONITORING, ED
Glucose-Capillary: 135 mg/dL — ABNORMAL HIGH (ref 70–99)
Glucose-Capillary: 136 mg/dL — ABNORMAL HIGH (ref 70–99)

## 2023-01-01 LAB — RESP PANEL BY RT-PCR (RSV, FLU A&B, COVID)  RVPGX2
Influenza A by PCR: NEGATIVE
Influenza B by PCR: NEGATIVE
Resp Syncytial Virus by PCR: NEGATIVE
SARS Coronavirus 2 by RT PCR: NEGATIVE

## 2023-01-01 MED ORDER — METHYLPREDNISOLONE SODIUM SUCC 125 MG IJ SOLR
125.0000 mg | Freq: Once | INTRAMUSCULAR | Status: AC
  Administered 2023-01-01: 125 mg via INTRAVENOUS
  Filled 2023-01-01: qty 2

## 2023-01-01 MED ORDER — INSULIN ASPART 100 UNIT/ML IJ SOLN
0.0000 [IU] | Freq: Three times a day (TID) | INTRAMUSCULAR | Status: DC
  Administered 2023-01-02 – 2023-01-03 (×4): 2 [IU] via SUBCUTANEOUS
  Administered 2023-01-03: 3 [IU] via SUBCUTANEOUS
  Administered 2023-01-03 – 2023-01-04 (×2): 2 [IU] via SUBCUTANEOUS
  Administered 2023-01-04 – 2023-01-05 (×2): 3 [IU] via SUBCUTANEOUS
  Administered 2023-01-05: 2 [IU] via SUBCUTANEOUS
  Administered 2023-01-05: 3 [IU] via SUBCUTANEOUS
  Administered 2023-01-06 (×2): 2 [IU] via SUBCUTANEOUS
  Administered 2023-01-07 (×2): 3 [IU] via SUBCUTANEOUS

## 2023-01-01 MED ORDER — INSULIN ASPART 100 UNIT/ML IJ SOLN: 0.0000 [IU] | Freq: Every day | INTRAMUSCULAR | Status: DC

## 2023-01-01 MED ORDER — ALBUTEROL SULFATE (2.5 MG/3ML) 0.083% IN NEBU: 2.5000 mg | INHALATION_SOLUTION | RESPIRATORY_TRACT | Status: DC | PRN

## 2023-01-01 NOTE — ED Notes (Signed)
Dr. Rhae Hammock aware of elevated troponin of 104.

## 2023-01-01 NOTE — ED Triage Notes (Signed)
Pt has wegener's

## 2023-01-01 NOTE — ED Notes (Signed)
CRITICAL VALUE STICKER  CRITICAL VALUE:Troponin 75  RECEIVER (on-site recipient of call):Carmon Ginsberg, RN  DATE & TIME NOTIFIED:   MESSENGER (representative from lab):  MD NOTIFIED: Tee  TIME OF NOTIFICATION:1831  RESPONSE:

## 2023-01-01 NOTE — ED Notes (Signed)
ED Provider at bedside. 

## 2023-01-01 NOTE — ED Triage Notes (Signed)
Short of breath, can hardly talk in triage, states "I have a bad heart and bad lungs and am scheduled for some heart procedure next week ). Home oxygen is 2-4 liters

## 2023-01-01 NOTE — ED Provider Notes (Signed)
[facility]ENC[facility] Provider Note   CSN: [id number] Arrival date & time: 01/01/23  1511     History  Chief Complaint  Patient presents with   Shortness of Breath    Gary [name] is a 77 y.o. male.  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  The patient has had gradually worsening dyspnea on exertion now over the past few days.  He states that he was initially thinking that this was a due to "overdoing it during the holidays".  The patient states that he is not really having chest pain with this.  He states that he is normally not on oxygen but over the past few days he has been having worsening dyspnea specifically with exertion.  It was getting to the point where he was short of breath at rest today even on nasal cannula oxygen so he came to the ER for further evaluation.  The patient was found to be hypoxic in the 70s.  He is placed on high flow nasal cannula with improvement.  He denies any leg pain or swelling.  He states that he is scheduled for a cardiac catheterization in 2 weeks and the plan is for possible bronchoscopy after that if his catheterization is unremarkable.  He states that he does have Wegener's granulomatosis and this has started giving him more lung issues over the past few months.  He states that he was on steroids and was gradually tapered down.  He states that he is down to 5 mg/day and when he got down to the 5 mg/day as when his symptoms started to worsen.   Shortness of Breath Associated symptoms: cough        Home Medications Prior to Admission medications   Medication Sig Start Date End Date Taking? Authorizing Provider  ALPRAZolam Prudy Feeler) 0.5 MG tablet Take 0.5 mg by mouth at bedtime. Takes at 11:00pm    [provider]  aspirin 81 MG chewable tablet Chew 81 mg by mouth daily.     [provider]  Calcium Carb-Cholecalciferol (CALCIUM 600/VITAMIN D3 PO) Take 1 tablet by mouth daily.    [provider]  carvedilol (COREG) 25 MG tablet Take 0.5 tablets (12.5 mg total) by mouth 2 (two) times daily. Patient taking differently: Take 12.5 mg by mouth 2 (two) times daily. Hold dose if SBP <110. 03/06/22   Zannie Cove, MD  Cholecalciferol (VITAMIN D-3) 125 MCG (5000 UT) TABS Take 10,000 Units by mouth daily.    [provider]  Coenzyme Q10 (COQ10 PO) Take 400 mg by mouth daily.    [provider]  dofetilide (TIKOSYN) 500 MCG capsule TAKE 1 CAPSULE BY MOUTH 2 TIMES DAILY. 11/02/22   Eustace Pen, PA-C  Evolocumab (REPATHA SURECLICK) 140 MG/ML SOAJ INJECT 140 MG INTO THE SKIN EVERY 14 (FOURTEEN) DAYS. INJECT 1 PEN INTO THE FATTY TISSUE SKIN EVERY 14 DAY 05/06/22   Chilton Si, MD  ferrous sulfate 325 (65 FE) MG EC tablet Take 325 mg by mouth daily with breakfast.    [provider]  furosemide (LASIX) 40 MG tablet Take 1 tablet (40 mg total) by mouth daily as needed. 05/11/22   Graciella Freer, PA-C  KLOR-CON M20 20 MEQ tablet TAKE 2 TABLETS BY MOUTH DAILY Patient taking differently: Take 20 mEq by mouth 2 (two) times daily. 07/12/22   Eustace Pen,  PA-C  levothyroxine (SYNTHROID, LEVOTHROID) 137 MCG tablet Take 137 mcg by mouth daily before breakfast. 10/27/14   [provider]  loratadine (CLARITIN) 10 MG tablet Take 10 mg by mouth daily.    [provider]  metFORMIN (GLUCOPHAGE) 500 MG tablet Take 500 mg by mouth 2 (two) times daily. 10/27/17   [provider]  Multiple Vitamins-Minerals (CENTRUM SILVER 50+MEN) TABS Take 1 tablet by mouth daily.    [provider]  nitroGLYCERIN (NITROSTAT) 0.4 MG SL tablet Place 1 tablet (0.4 mg total) under the tongue every 5 (five) minutes x 3 doses as needed for chest pain. 09/29/21   Jonita Albee, PA-C  pantoprazole (PROTONIX) 40 MG  tablet Take 40 mg by mouth daily.    [provider]  predniSONE (DELTASONE) 5 MG tablet Take 10 mg by mouth daily with breakfast. Tapered Dose 01/10/23 - Start 5 mg    [provider]  riTUXimab (RITUXAN IV) Inject 1 application  into the vein See admin instructions. Take every two weeks and every six months alternating    [provider]  Semaglutide (OZEMPIC, 0.25 OR 0.5 MG/DOSE, [place]) Inject 0.5 mg into the skin once a week.    [provider]  testosterone cypionate (DEPOTESTOSTERONE CYPIONATE) 200 MG/ML injection Inject 150 mg into the muscle every 14 (fourteen) days.    [provider]  venlafaxine XR (EFFEXOR-XR) 150 MG 24 hr capsule Take 150 mg by mouth daily.    [provider]  warfarin (COUMADIN) 5 MG tablet TAKE 1/2 TABLET TO 1 TABLET BY MOUTH DAILY AS DIRECTED BY COUMADIN CLINIC Patient taking differently: Take 2.5-5 mg by mouth See admin instructions. TAKE 1/2 TABLET TO 1 TABLET BY MOUTH DAILY AS DIRECTED BY COUMADIN CLINIC; 2.5 mg Mon Wed Fri; 5 mg all other days 11/11/22   Chilton Si, MD      Allergies    Marcelline Deist [dapagliflozin] and Statins    Review of Systems   Review of Systems  Respiratory:  Positive for cough and shortness of breath.   All other systems reviewed and are negative.   Physical Exam Updated Vital Signs BP (!) 160/80 (BP Location: Right Arm)   Pulse 60   Temp 98.5 F (36.9 C) (Oral)   Resp 14   Wt 101.2 kg   SpO2 99%   BMI 33.41 kg/m  Physical Exam Vitals and nursing note reviewed.   Gen: Mild conversational dyspnea noted Eyes: PERRL, EOMI HEENT: no oropharyngeal swelling Neck: trachea midline Resp: Diminished with crackles at bilateral lung bases Card: RRR, no murmurs, rubs, or gallops Abd: nontender, nondistended Extremities: no calf tenderness, no edema Vascular: 2+ radial pulses bilaterally, 2+ DP pulses bilaterally Skin: no rashes Psyc: acting appropriately    ED Results /  Procedures / Treatments   Labs (all labs ordered are listed, but only abnormal results are displayed) Labs Reviewed  BASIC METABOLIC PANEL - Abnormal; Notable for the following components:      Result Value   Glucose, Bld 125 (*)    All other components within normal limits  CBC - Abnormal; Notable for the following components:   WBC 13.8 (*)    Hemoglobin 10.6 (*)    HCT 35.9 (*)    MCH 24.1 (*)    MCHC 29.5 (*)    RDW 19.2 (*)    Platelets 455 (*)    All other components within normal limits  BRAIN NATRIURETIC PEPTIDE - Abnormal; Notable for the following components:  B Natriuretic Peptide 513.8 (*)    All other components within normal limits  PROTIME-INR - Abnormal; Notable for the following components:   Prothrombin Time 26.5 (*)    INR 2.4 (*)    All other components within normal limits  CBG MONITORING, ED - Abnormal; Notable for the following components:   Glucose-Capillary 135 (*)    All other components within normal limits  CBG MONITORING, ED - Abnormal; Notable for the following components:   Glucose-Capillary 136 (*)    All other components within normal limits  TROPONIN I (HIGH SENSITIVITY) - Abnormal; Notable for the following components:   Troponin I (High Sensitivity) 32 (*)    All other components within normal limits  TROPONIN I (HIGH SENSITIVITY) - Abnormal; Notable for the following components:   Troponin I (High Sensitivity) 75 (*)    All other components within normal limits  TROPONIN I (HIGH SENSITIVITY) - Abnormal; Notable for the following components:   Troponin I (High Sensitivity) 104 (*)    All other components within normal limits  RESP PANEL BY RT-PCR (RSV, FLU A&B, COVID)  [id number]  PROCALCITONIN    EKG EKG Interpretation Date/Time:  Saturday January 01 2023 15:23:00 EST Ventricular Rate:  73 PR Interval:  160 QRS Duration:  99 QT Interval:  439 QTC Calculation: 484 R Axis:   -6  Text Interpretation: Sinus rhythm Abnormal R-wave  progression, late transition LVH with secondary repolarization abnormality Borderline prolonged QT interval Confirmed by Beckey Downing [phone]) on 01/01/2023 3:57:31 PM  Radiology DG Chest Port 1 View Result Date: 01/01/2023 CLINICAL DATA:  Shortness of breath. EXAM: PORTABLE CHEST 1 VIEW COMPARISON:  09/17/2022. FINDINGS: Low lung volume. There are diffuse increased interstitial markings, less pronounced than the prior radiograph from 10/04/2022, which corresponds to chronic interstitial lung disease on recent chest CT scan from 11/05/2022. Bilateral lung fields are otherwise clear. No acute consolidation or lung collapse. Bilateral costophrenic angles are clear. Stable cardio-mediastinal silhouette. Sternotomy wires noted, status post CABG. No acute osseous abnormalities. The soft tissues are within normal limits. IMPRESSION: *No acute cardiopulmonary abnormality.  Interstitial lung disease. Electronically Signed   By: Jules Schick M.D.   On: 01/01/2023 17:19    Procedures Procedures    Medications Ordered in ED Medications  albuterol (PROVENTIL) (2.5 MG/3ML) 0.083% nebulizer solution 2.5 mg (has no administration in time range)  insulin aspart (novoLOG) injection 0-15 Units (has no administration in time range)  insulin aspart (novoLOG) injection 0-5 Units ( Subcutaneous Not Given 01/01/23 2206)  methylPREDNISolone sodium succinate (SOLU-MEDROL) 125 mg/2 mL injection 125 mg (125 mg Intravenous Given 01/01/23 1916)    ED Course/ Medical Decision Making/ A&P                                 Medical Decision Making 77 year old male with 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  I will further evaluate patient here with basic labs as well as an EKG, chest x-ray, troponin, and BNP for further evaluation for ACS, pulmonary edema, pulmonary infiltrates, pneumothorax.  Based on description of his  symptoms suspicion for pulmonary embolism is relatively low at this time.  He denies any history of COPD or asthma.  I do not appreciate any wheezing so we will hold off on any nebulizer treatments here.  Also obtain a COVID and flu swab on  the patient to evaluate for viral etiologies.  He is stable now on high flow nasal cannula but was significantly hypoxic on arrival.  He will require admission for further evaluation.  The patient's labs are reassuring.  His troponin is mildly elevated here.  He is denying any chest pain so suspicion for ACS is low at this time.  We will continue to trend this but suspicion for NSTEMI is low at this time.  Patient given Solu-Medrol here for likely exacerbation of his Wegener's granulomatosis.  Calls placed to hospital service for admission.  Amount and/or Complexity of Data Reviewed Labs: ordered. Radiology: ordered.  Risk Prescription drug management. Decision regarding hospitalization.           Final Clinical Impression(s) / ED Diagnoses Final diagnoses:  Hypoxia  Elevated troponin    Rx / DC Orders ED Discharge Orders     None         Durwin Glaze, MD 01/01/23 2345

## 2023-01-02 ENCOUNTER — Other Ambulatory Visit (HOSPITAL_BASED_OUTPATIENT_CLINIC_OR_DEPARTMENT_OTHER): Payer: Self-pay | Admitting: Family

## 2023-01-02 ENCOUNTER — Encounter (HOSPITAL_COMMUNITY): Payer: Self-pay | Admitting: Family Medicine

## 2023-01-02 ENCOUNTER — Other Ambulatory Visit: Payer: Self-pay

## 2023-01-02 DIAGNOSIS — Z9981 Dependence on supplemental oxygen: Secondary | ICD-10-CM | POA: Diagnosis not present

## 2023-01-02 DIAGNOSIS — J8489 Other specified interstitial pulmonary diseases: Secondary | ICD-10-CM | POA: Diagnosis present

## 2023-01-02 DIAGNOSIS — D509 Iron deficiency anemia, unspecified: Secondary | ICD-10-CM

## 2023-01-02 DIAGNOSIS — D539 Nutritional anemia, unspecified: Secondary | ICD-10-CM | POA: Diagnosis present

## 2023-01-02 DIAGNOSIS — E1151 Type 2 diabetes mellitus with diabetic peripheral angiopathy without gangrene: Secondary | ICD-10-CM | POA: Diagnosis present

## 2023-01-02 DIAGNOSIS — I5082 Biventricular heart failure: Secondary | ICD-10-CM | POA: Diagnosis present

## 2023-01-02 DIAGNOSIS — Z1152 Encounter for screening for COVID-19: Secondary | ICD-10-CM | POA: Diagnosis not present

## 2023-01-02 DIAGNOSIS — R6510 Systemic inflammatory response syndrome (SIRS) of non-infectious origin without acute organ dysfunction: Secondary | ICD-10-CM

## 2023-01-02 DIAGNOSIS — E785 Hyperlipidemia, unspecified: Secondary | ICD-10-CM | POA: Diagnosis present

## 2023-01-02 DIAGNOSIS — D72829 Elevated white blood cell count, unspecified: Secondary | ICD-10-CM

## 2023-01-02 DIAGNOSIS — I5032 Chronic diastolic (congestive) heart failure: Secondary | ICD-10-CM | POA: Diagnosis present

## 2023-01-02 DIAGNOSIS — J849 Interstitial pulmonary disease, unspecified: Secondary | ICD-10-CM | POA: Diagnosis not present

## 2023-01-02 DIAGNOSIS — J309 Allergic rhinitis, unspecified: Secondary | ICD-10-CM | POA: Diagnosis present

## 2023-01-02 DIAGNOSIS — E039 Hypothyroidism, unspecified: Secondary | ICD-10-CM | POA: Diagnosis present

## 2023-01-02 DIAGNOSIS — Z23 Encounter for immunization: Secondary | ICD-10-CM | POA: Diagnosis present

## 2023-01-02 DIAGNOSIS — R590 Localized enlarged lymph nodes: Secondary | ICD-10-CM | POA: Diagnosis not present

## 2023-01-02 DIAGNOSIS — I700 Atherosclerosis of aorta: Secondary | ICD-10-CM | POA: Diagnosis present

## 2023-01-02 DIAGNOSIS — I2510 Atherosclerotic heart disease of native coronary artery without angina pectoris: Secondary | ICD-10-CM | POA: Diagnosis not present

## 2023-01-02 DIAGNOSIS — R7989 Other specified abnormal findings of blood chemistry: Secondary | ICD-10-CM | POA: Diagnosis not present

## 2023-01-02 DIAGNOSIS — I110 Hypertensive heart disease with heart failure: Secondary | ICD-10-CM | POA: Diagnosis present

## 2023-01-02 DIAGNOSIS — R609 Edema, unspecified: Secondary | ICD-10-CM | POA: Diagnosis not present

## 2023-01-02 DIAGNOSIS — G8929 Other chronic pain: Secondary | ICD-10-CM | POA: Diagnosis present

## 2023-01-02 DIAGNOSIS — R918 Other nonspecific abnormal finding of lung field: Secondary | ICD-10-CM | POA: Diagnosis not present

## 2023-01-02 DIAGNOSIS — J9601 Acute respiratory failure with hypoxia: Secondary | ICD-10-CM | POA: Diagnosis present

## 2023-01-02 DIAGNOSIS — I480 Paroxysmal atrial fibrillation: Secondary | ICD-10-CM | POA: Diagnosis present

## 2023-01-02 DIAGNOSIS — F32A Depression, unspecified: Secondary | ICD-10-CM

## 2023-01-02 DIAGNOSIS — E119 Type 2 diabetes mellitus without complications: Secondary | ICD-10-CM | POA: Diagnosis not present

## 2023-01-02 DIAGNOSIS — J9600 Acute respiratory failure, unspecified whether with hypoxia or hypercapnia: Secondary | ICD-10-CM | POA: Diagnosis not present

## 2023-01-02 DIAGNOSIS — I272 Pulmonary hypertension, unspecified: Secondary | ICD-10-CM | POA: Diagnosis not present

## 2023-01-02 DIAGNOSIS — F411 Generalized anxiety disorder: Secondary | ICD-10-CM

## 2023-01-02 DIAGNOSIS — E66811 Obesity, class 1: Secondary | ICD-10-CM | POA: Diagnosis present

## 2023-01-02 DIAGNOSIS — I10 Essential (primary) hypertension: Secondary | ICD-10-CM | POA: Diagnosis not present

## 2023-01-02 DIAGNOSIS — J301 Allergic rhinitis due to pollen: Secondary | ICD-10-CM | POA: Diagnosis not present

## 2023-01-02 DIAGNOSIS — I2723 Pulmonary hypertension due to lung diseases and hypoxia: Secondary | ICD-10-CM | POA: Diagnosis present

## 2023-01-02 DIAGNOSIS — M3130 Wegener's granulomatosis without renal involvement: Secondary | ICD-10-CM | POA: Diagnosis present

## 2023-01-02 DIAGNOSIS — J9621 Acute and chronic respiratory failure with hypoxia: Secondary | ICD-10-CM | POA: Diagnosis present

## 2023-01-02 DIAGNOSIS — B59 Pneumocystosis: Secondary | ICD-10-CM | POA: Diagnosis not present

## 2023-01-02 DIAGNOSIS — M069 Rheumatoid arthritis, unspecified: Secondary | ICD-10-CM | POA: Diagnosis present

## 2023-01-02 DIAGNOSIS — J84112 Idiopathic pulmonary fibrosis: Secondary | ICD-10-CM | POA: Diagnosis present

## 2023-01-02 DIAGNOSIS — J432 Centrilobular emphysema: Secondary | ICD-10-CM | POA: Diagnosis present

## 2023-01-02 LAB — GLUCOSE, CAPILLARY
Glucose-Capillary: 128 mg/dL — ABNORMAL HIGH (ref 70–99)
Glucose-Capillary: 96 mg/dL (ref 70–99)

## 2023-01-02 LAB — CBG MONITORING, ED
Glucose-Capillary: 147 mg/dL — ABNORMAL HIGH (ref 70–99)
Glucose-Capillary: 132 mg/dL — ABNORMAL HIGH (ref 70–99)

## 2023-01-02 LAB — MAGNESIUM: Magnesium: 2 mg/dL (ref 1.7–2.4)

## 2023-01-02 LAB — PROCALCITONIN: Procalcitonin: <0.1 ng/mL

## 2023-01-02 MED ORDER — FERROUS SULFATE 325 (65 FE) MG PO TABS
325.0000 mg | ORAL_TABLET | Freq: Every day | ORAL | Status: DC
  Administered 2023-01-03 – 2023-01-08 (×6): 325 mg via ORAL
  Filled 2023-01-02 (×6): qty 1

## 2023-01-02 MED ORDER — PANTOPRAZOLE SODIUM 40 MG PO TBEC
40.0000 mg | DELAYED_RELEASE_TABLET | Freq: Every day | ORAL | Status: DC
  Administered 2023-01-03 – 2023-01-08 (×6): 40 mg via ORAL
  Filled 2023-01-02 (×6): qty 1

## 2023-01-02 MED ORDER — ONDANSETRON HCL 4 MG/2ML IJ SOLN: 4.0000 mg | Freq: Four times a day (QID) | INTRAMUSCULAR | Status: DC | PRN

## 2023-01-02 MED ORDER — WARFARIN SODIUM 2.5 MG PO TABS: 2.5000 mg | ORAL_TABLET | ORAL | Status: DC

## 2023-01-02 MED ORDER — METFORMIN HCL 500 MG PO TABS
500.0000 mg | ORAL_TABLET | Freq: Two times a day (BID) | ORAL | Status: DC
  Administered 2023-01-02 (×2): 500 mg via ORAL
  Filled 2023-01-02 (×2): qty 1

## 2023-01-02 MED ORDER — ASPIRIN 81 MG PO CHEW
81.0000 mg | CHEWABLE_TABLET | Freq: Every day | ORAL | Status: DC
  Administered 2023-01-02 – 2023-01-08 (×7): 81 mg via ORAL
  Filled 2023-01-02 (×7): qty 1

## 2023-01-02 MED ORDER — GUAIFENESIN 100 MG/5ML PO LIQD
5.0000 mL | Freq: Once | ORAL | Status: AC
  Administered 2023-01-02: 5 mL via ORAL
  Filled 2023-01-02: qty 10

## 2023-01-02 MED ORDER — PNEUMOCOCCAL 20-VAL CONJ VACC 0.5 ML IM SUSY
0.5000 mL | PREFILLED_SYRINGE | INTRAMUSCULAR | Status: AC
  Administered 2023-01-07: 0.5 mL via INTRAMUSCULAR
  Filled 2023-01-02: qty 0.5

## 2023-01-02 MED ORDER — LORATADINE 10 MG PO TABS
10.0000 mg | ORAL_TABLET | Freq: Every day | ORAL | Status: DC
  Administered 2023-01-03 – 2023-01-08 (×6): 10 mg via ORAL
  Filled 2023-01-02 (×6): qty 1

## 2023-01-02 MED ORDER — METHYLPREDNISOLONE SODIUM SUCC 125 MG IJ SOLR: 80.0000 mg | Freq: Every day | INTRAMUSCULAR | Status: DC

## 2023-01-02 MED ORDER — DOFETILIDE 250 MCG PO CAPS
500.0000 ug | ORAL_CAPSULE | Freq: Two times a day (BID) | ORAL | Status: DC
  Administered 2023-01-02 – 2023-01-08 (×13): 500 ug via ORAL
  Filled 2023-01-02 (×2): qty 2
  Filled 2023-01-02: qty 1
  Filled 2023-01-02 (×9): qty 2
  Filled 2023-01-02: qty 1
  Filled 2023-01-02: qty 2

## 2023-01-02 MED ORDER — CARVEDILOL 12.5 MG PO TABS
12.5000 mg | ORAL_TABLET | Freq: Two times a day (BID) | ORAL | Status: DC
  Administered 2023-01-02 – 2023-01-08 (×13): 12.5 mg via ORAL
  Filled 2023-01-02 (×13): qty 1

## 2023-01-02 MED ORDER — METHYLPREDNISOLONE SODIUM SUCC 125 MG IJ SOLR
80.0000 mg | Freq: Two times a day (BID) | INTRAMUSCULAR | Status: DC
  Administered 2023-01-02 – 2023-01-05 (×6): 80 mg via INTRAVENOUS
  Filled 2023-01-02 (×6): qty 2

## 2023-01-02 MED ORDER — ALPRAZOLAM 0.5 MG PO TABS: 0.5000 mg | ORAL_TABLET | Freq: Every day | ORAL | Status: DC

## 2023-01-02 MED ORDER — WARFARIN - PHARMACIST DOSING INPATIENT
Freq: Every day | Status: DC
  Filled 2023-01-02: qty 1

## 2023-01-02 MED ORDER — ALPRAZOLAM 0.5 MG PO TABS
0.5000 mg | ORAL_TABLET | Freq: Every evening | ORAL | Status: DC | PRN
  Administered 2023-01-03 – 2023-01-07 (×2): 0.5 mg via ORAL
  Filled 2023-01-02 (×2): qty 1

## 2023-01-02 MED ORDER — LEVOTHYROXINE SODIUM 25 MCG PO TABS
137.0000 ug | ORAL_TABLET | Freq: Every day | ORAL | Status: DC
  Administered 2023-01-03 – 2023-01-08 (×6): 137 ug via ORAL
  Filled 2023-01-02 (×8): qty 1

## 2023-01-02 MED ORDER — MELATONIN 3 MG PO TABS: 3.0000 mg | ORAL_TABLET | Freq: Every evening | ORAL | Status: DC | PRN

## 2023-01-02 MED ORDER — ACETAMINOPHEN 650 MG RE SUPP: 650.0000 mg | Freq: Four times a day (QID) | RECTAL | Status: DC | PRN

## 2023-01-02 MED ORDER — VENLAFAXINE HCL ER 75 MG PO CP24
150.0000 mg | ORAL_CAPSULE | Freq: Every day | ORAL | Status: DC
  Administered 2023-01-02 – 2023-01-08 (×7): 150 mg via ORAL
  Filled 2023-01-02 (×4): qty 2
  Filled 2023-01-02: qty 1
  Filled 2023-01-02 (×2): qty 2
  Filled 2023-01-02: qty 1

## 2023-01-02 MED ORDER — IPRATROPIUM-ALBUTEROL 0.5-2.5 (3) MG/3ML IN SOLN: 3.0000 mL | Freq: Four times a day (QID) | RESPIRATORY_TRACT | Status: DC

## 2023-01-02 MED ORDER — ACETAMINOPHEN 325 MG PO TABS: 650.0000 mg | ORAL_TABLET | Freq: Four times a day (QID) | ORAL | Status: DC | PRN

## 2023-01-02 MED ORDER — ENSURE ENLIVE PO LIQD
237.0000 mL | Freq: Two times a day (BID) | ORAL | Status: DC
  Administered 2023-01-03 – 2023-01-07 (×8): 237 mL via ORAL

## 2023-01-02 MED ORDER — WARFARIN SODIUM 5 MG PO TABS
5.0000 mg | ORAL_TABLET | Freq: Once | ORAL | Status: DC
  Filled 2023-01-02: qty 1

## 2023-01-02 NOTE — ED Notes (Signed)
While sitting on the side of the bed, pts oxygen sats are in the 70's, evenon 6 liters high flow oxygen.pt returns to 90's when he is lying in bed/stretcher.pt did not want to get into recliner due to his oxygen saturations

## 2023-01-02 NOTE — ED Notes (Signed)
I spoke with Tammy Sours from [facility] and he is to currier tikosyn to [employer].

## 2023-01-02 NOTE — H&P (Signed)
History and Physical      Gary [name] DOB: March 06, 1945 DOA: 01/01/2023; DOS: 01/02/2023  PCP: Charlane Ferretti, DO  Patient coming from: home   I have personally briefly reviewed patient's old medical records in [facility]  Chief Complaint: sob  HPI: Gary [name] is a 77 y.o. male with medical history significant for interstitial lung disease, Wegener's granulomatosis, chronic iron deficiency anemia associated baseline hemoglobin 10-13, type 2 diabetes mellitus, coronary artery disease status post CABG in January 2006 followed by PCI with stent placement in September 2023, paroxysmal atrial fibrillation chronically anticoagulated on warfarin, chronic diastolic heart failure, who is admitted to [facility] on 01/01/2023 by way of transfer from [facility] with acute Evoxac respiratory failure in the setting of exacerbation of interstitial lung disease secondary to acute Wegener's granulomatosis flare after presenting from home to [facility] ED complaining of shortness of breath.    The patient reports approximately 1 week of progressive shortness of breath in the absence of any associated orthopnea, PND, or peripheral edema.  Denies any associated subjective fever, chills, rigors, generalized myalgias.  Not associate with any chest pain, palpitations, diaphoresis, nausea, vomiting, dizziness, presyncope, or syncope.  Denies any associated wheezing, mopped assist, or new lower extremity erythema or calf tenderness.  No preceding rhinitis, rhinorrhea, or sore throat.  He was hospitalized in February 2024 with similar symptoms, at which time he was diagnosed with acute flare of his Wegener's granulomatosis, which was treated with high-dose steroids.  Since that time, he has been on chronic prednisone therapy, with gradual weaning of the associated dose of the prednisone.  The weaning process recently hit a stage in which his dose was reduced to 10 mg p.o. daily.  Shortly  after this transition, he notes that his presenting shortness of breath started.  He is scheduled to further wean his daily prednisone to 5 mg p.o. daily during the first week of January 2025.  He is also on IV rituximab.  Medical history notable for paroxysmal atrial fibrillation for which she reports good compliance with his outpatient chronic anticoagulation via warfarin.  In the setting of his interstitial lung disease and Wegener's granulomatosis, he conveys that he is on supplemental oxygen at home on a as needed basis only.  He conveys that he is a former smoker, and carries no known diagnosis of underlying COPD.    [facility] ED Course:  Vital signs in the ED were notable for the following: Afebrile; heart rates in the 60s to 80s; systolic blood pressures in the 120s to 160s; respiratory rate 14-27, initial oxygen saturation noted to be 72% on room air, Sosan improving into the range of 94 to 100% on 6 L salter high flow [place].  Labs were notable for the following: CMP notable for the following: Sodium 142, bicarbonate 25, creatinine 0.91 compared to most recent prior creatinine did point of 1.1 on 1020 324, glucose 125.  BNP 513 compared to 322 in September 2024 as well as 471 in February 2024.  High sensitive troponin I initially 32 followed by 75, followed by 104, relative to most recent prior value of 14 on 09/17/2022.  Procalcitonin less than 0.10.  CBC notable for white cell count 13,800 relative to most recent prior value of 16,800 on 09/22/2022, hemoglobin 10.6 associated Neuraceq properties and relative demonstrates a prior hemoglobin data point of 11.1 on 09/22/2022, platelet count 455.  INR 2.4.  COVID, influenza, RSV PCR all negative.  Per my interpretation, EKG in  ED demonstrated the following: Sinus rhythm with heart rate 73, normal intervals, nonspecific T wave flattening in leads I, aVL, and nonspecific less than 1 mm ST depression in V5, without any evidence of ST  elevation.  Imaging in the ED, per corresponding formal radiology read, was notable for the following: 1 view chest x-ray showed chronic findings of interstitial lung disease, will demonstrate no evidence of acute cardiopulmonary process, including no evidence of objective edema, effusion, or pneumothorax.  While in the ED, the following were administered: Coreg 12.5 mg p.o. x 2 doses, Tikosyn, guaifenesin 100 mg p.o. x 1 dose, NovoLog 2 units subcu x 3 doses, metformin 500 mg p.o. x 2 doses, Solu-Medrol 125 mg IV x 1 dose, venlafaxine 150 mg p.o. x 1 dose.  Subsequently, the patient was admitted to [facility] for further evaluation management of presenting acute hypoxic respiratory failure in the setting of acute exacerbation of interstitial lung disease in the setting of acute flare of Wegener's granulomatosis, with presenting labs notable for mildly elevated troponin.     Review of Systems: As per HPI otherwise 10 point review of systems negative.   Past Medical History:  Diagnosis Date   Aortic aneurysm (HCC) 02/20/2016   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   Arthritis    Atrial fibrillation (HCC) 11/13/2014   Cancer (HCC)    skin   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/2016   LVEF 45-50% 11/2014   Coronary artery disease    Depression    Essential hypertension 11/13/2014   Hyperlipidemia    Hyperlipidemia 11/13/2014   Hypertension    Sleep apnea    CPAP 'Uses half the time"  last study 2009   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    Past Surgical History:  Procedure Laterality Date   CARDIOVERSION N/A 12/06/2014   Procedure: CARDIOVERSION;  Surgeon: Chilton Si, MD;  Location: [facility] ENDOSCOPY;  Service: Cardiovascular;  Laterality: N/A;   CARDIOVERSION N/A 05/26/2017   Procedure: CARDIOVERSION;  Surgeon: Chilton Si, MD;  Location: [facility] ENDOSCOPY;  Service: Cardiovascular;  Laterality: N/A;   CARDIOVERSION N/A 04/25/2019   Procedure:  CARDIOVERSION;  Surgeon: Thurmon Fair, MD;  Location: [facility] ENDOSCOPY;  Service: Cardiovascular;  Laterality: N/A;   CARDIOVERSION N/A 02/26/2022   Procedure: CARDIOVERSION;  Surgeon: Chilton Si, MD;  Location: [facility] ENDOSCOPY;  Service: Cardiovascular;  Laterality: N/A;   CARDIOVERSION N/A 05/13/2022   Procedure: CARDIOVERSION;  Surgeon: Quintella Reichert, MD;  Location: [facility] INVASIVE CV LAB;  Service: Cardiovascular;  Laterality: N/A;   CARPAL TUNNEL RELEASE     CORONARY ANGIOPLASTY     multiple stents   CORONARY ARTERY BYPASS GRAFT  2006   CORONARY STENT INTERVENTION N/A 09/28/2021   Procedure: CORONARY STENT INTERVENTION;  Surgeon: Runell Gess, MD;  Location: [facility] INVASIVE CV LAB;  Service: Cardiovascular;  Laterality: N/A;  SVG-PDA   EYE SURGERY     right eye-  muscular repair   LEFT HEART CATH AND CORS/GRAFTS ANGIOGRAPHY N/A 09/28/2021   Procedure: LEFT HEART CATH AND CORS/GRAFTS ANGIOGRAPHY;  Surgeon: Runell Gess, MD;  Location: [facility] INVASIVE CV LAB;  Service: Cardiovascular;  Laterality: N/A;   TOTAL HIP ARTHROPLASTY  01/03/2012   Procedure: TOTAL HIP ARTHROPLASTY;  Surgeon: Jacki Cones, MD;  Location: [facility] ORS;  Service: Orthopedics;  Laterality: Right;    Social History:  reports that he quit smoking about 39 years ago. His smoking use included cigarettes. He has never used smokeless tobacco. He reports that he does  not drink alcohol and does not use drugs.   Allergies  Allergen Reactions   Farxiga [Dapagliflozin] Other (See Comments)    Lightheadedness Low blood pressure   Statins Other (See Comments)    Myalgias     Family History  Problem Relation Age of Onset   Heart attack Mother    Other Father        odd stomach disorder   Stroke Maternal Grandmother    Heart attack Maternal Grandfather    Heart attack Paternal Grandmother    Heart attack Paternal Grandfather     Family history reviewed and not pertinent    Prior to Admission medications    Medication Sig Start Date End Date Taking? Authorizing Provider  ALPRAZolam Prudy Feeler) 0.5 MG tablet Take 0.5 mg by mouth at bedtime. Takes at 11:00pm   Yes [provider]  aspirin 81 MG chewable tablet Chew 81 mg by mouth daily.   Yes [provider]  Calcium Carb-Cholecalciferol (CALCIUM 600/VITAMIN D3 PO) Take 1 tablet by mouth daily.   Yes [provider]  carvedilol (COREG) 25 MG tablet Take 0.5 tablets (12.5 mg total) by mouth 2 (two) times daily. Patient taking differently: Take 12.5 mg by mouth 2 (two) times daily. Hold dose if SBP <110. 03/06/22  Yes Zannie Cove, MD  Cholecalciferol (VITAMIN D-3) 125 MCG (5000 UT) TABS Take 10,000 Units by mouth daily.   Yes [provider]  Coenzyme Q10 (COQ10 PO) Take 400 mg by mouth daily.   Yes [provider]  dofetilide (TIKOSYN) 500 MCG capsule TAKE 1 CAPSULE BY MOUTH 2 TIMES DAILY. 11/02/22  Yes Eustace Pen, PA-C  Evolocumab (REPATHA SURECLICK) 140 MG/ML SOAJ INJECT 140 MG INTO THE SKIN EVERY 14 (FOURTEEN) DAYS. INJECT 1 PEN INTO THE FATTY TISSUE SKIN EVERY 14 DAY 05/06/22  Yes Chilton Si, MD  ferrous sulfate 325 (65 FE) MG EC tablet Take 325 mg by mouth daily with breakfast.   Yes [provider]  furosemide (LASIX) 40 MG tablet Take 1 tablet (40 mg total) by mouth daily as needed. 05/11/22  Yes Graciella Freer, PA-C  levothyroxine (SYNTHROID, LEVOTHROID) 137 MCG tablet Take 137 mcg by mouth daily before breakfast. 10/27/14  Yes [provider]  loratadine (CLARITIN) 10 MG tablet Take 10 mg by mouth daily.   Yes [provider]  metFORMIN (GLUCOPHAGE) 500 MG tablet Take 500 mg by mouth 2 (two) times daily. 10/27/17  Yes [provider]  Multiple Vitamins-Minerals (CENTRUM SILVER 50+MEN) TABS Take 1 tablet by mouth daily.   Yes [provider]  nitroGLYCERIN (NITROSTAT) 0.4 MG SL tablet Place 1 tablet (0.4 mg total) under the tongue every 5  (five) minutes x 3 doses as needed for chest pain. 09/29/21  Yes Jonita Albee, PA-C  pantoprazole (PROTONIX) 40 MG tablet Take 40 mg by mouth daily.   Yes [provider]  predniSONE (DELTASONE) 5 MG tablet Take 10 mg by mouth daily with breakfast. Tapered Dose 01/10/23 - Start 5 mg   Yes [provider]  Semaglutide (OZEMPIC, 0.25 OR 0.5 MG/DOSE, Ida) Inject 0.5 mg into the skin once a week.   Yes [provider]  testosterone cypionate (DEPOTESTOSTERONE CYPIONATE) 200 MG/ML injection Inject 150 mg into the muscle every 14 (fourteen) days.   Yes [provider]  venlafaxine XR (EFFEXOR-XR) 150 MG 24 hr capsule Take 150 mg by mouth daily.   Yes [provider]  warfarin (COUMADIN) 5 MG tablet TAKE  1/2 TABLET TO 1 TABLET BY MOUTH DAILY AS DIRECTED BY COUMADIN CLINIC Patient taking differently: Take 2.5-5 mg by mouth See admin instructions. TAKE 1/2 TABLET TO 1 TABLET BY MOUTH DAILY AS DIRECTED BY COUMADIN CLINIC; 2.5 mg Mon Wed Fri; 5 mg all other days 11/11/22  Yes Chilton Si, MD  potassium chloride SA (KLOR-CON M) 20 MEQ tablet Take 20 mEq by mouth 2 (two) times daily. Patient not taking: Reported on 01/02/2023    [provider]  riTUXimab (RITUXAN IV) Inject 1 application  into the vein See admin instructions. Take every two weeks and every six months alternating    [provider]     Objective    Physical Exam: Vitals:   01/02/23 1755 01/02/23 1815 01/02/23 1853 01/02/23 1925  BP: (!) 135/57   (!) 141/69  Pulse: 66   62  Resp: (!) 21   20  Temp: 98.4 F (36.9 C)   98.4 F (36.9 C)  TempSrc: Oral   Oral  SpO2: 92%   99%  Weight:   98.8 kg   Height:  5\' 10"  (1.778 m)      General: appears to be stated age; alert, oriented Skin: warm, dry, no rash Head:  AT/[place] Mouth:  Oral mucosa membranes appear moist, normal dentition Neck: supple; trachea midline Heart:  RRR; did not appreciate any M/R/G Lungs: CTAB,  did not appreciate any wheezes, rales, or rhonchi Abdomen: + BS; soft, ND, NT Vascular: 2+ pedal pulses b/l; 2+ radial pulses b/l Extremities: no peripheral edema, no muscle wasting Neuro: strength and sensation intact in upper and lower extremities b/l     Labs on Admission: I have personally reviewed following labs and imaging studies  CBC: Recent Labs  Lab 01/01/23 1541  WBC 13.8*  HGB 10.6*  HCT 35.9*  MCV 81.8  PLT 455*   Basic Metabolic Panel: Recent Labs  Lab 01/01/23 1541  NA 142  K 4.3  CL 106  CO2 25  GLUCOSE 125*  BUN 20  CREATININE 0.91  CALCIUM 10.0   GFR: Estimated Creatinine Clearance: 77.6 mL/min (by C-G formula based on SCr of 0.91 mg/dL). Liver Function Tests: No results for input(s): "AST", "ALT", "ALKPHOS", "BILITOT", "PROT", "ALBUMIN" in the last 168 hours. No results for input(s): "LIPASE", "AMYLASE" in the last 168 hours. No results for input(s): "AMMONIA" in the last 168 hours. Coagulation Profile: Recent Labs  Lab 01/01/23 1541  INR 2.4*   Cardiac Enzymes: No results for input(s): "CKTOTAL", "CKMB", "CKMBINDEX", "TROPONINI" in the last 168 hours. BNP (last 3 results) Recent Labs    11/04/22 1519  PROBNP 416.0*   HbA1C: No results for input(s): "HGBA1C" in the last 72 hours. CBG: Recent Labs  Lab 01/01/23 2121 01/01/23 2206 01/02/23 0922 01/02/23 1204 01/02/23 1819  GLUCAP 135* 136* 147* 132* 128*   Lipid Profile: No results for input(s): "CHOL", "HDL", "LDLCALC", "TRIG", "CHOLHDL", "LDLDIRECT" in the last 72 hours. Thyroid Function Tests: No results for input(s): "TSH", "T4TOTAL", "FREET4", "T3FREE", "THYROIDAB" in the last 72 hours. Anemia Panel: No results for input(s): "VITAMINB12", "FOLATE", "FERRITIN", "TIBC", "IRON", "RETICCTPCT" in the last 72 hours. Urine analysis:    Component Value Date/Time   COLORURINE YELLOW 09/17/2022 2236   APPEARANCEUR HAZY (A) 09/17/2022 2236   LABSPEC 1.010 09/17/2022 2236    PHURINE 6.5 09/17/2022 2236   GLUCOSEU NEGATIVE 09/17/2022 2236   HGBUR NEGATIVE 09/17/2022 2236   BILIRUBINUR NEGATIVE 09/17/2022 2236   KETONESUR NEGATIVE 09/17/2022 2236   PROTEINUR  NEGATIVE 09/17/2022 2236   UROBILINOGEN 0.2 12/24/2011 0805   NITRITE NEGATIVE 09/17/2022 2236   LEUKOCYTESUR SMALL (A) 09/17/2022 2236    Radiological Exams on Admission: DG Chest Port 1 View Result Date: 01/01/2023 CLINICAL DATA:  Shortness of breath. EXAM: PORTABLE CHEST 1 VIEW COMPARISON:  09/17/2022. FINDINGS: Low lung volume. There are diffuse increased interstitial markings, less pronounced than the prior radiograph from 10/04/2022, which corresponds to chronic interstitial lung disease on recent chest CT scan from 11/05/2022. Bilateral lung fields are otherwise clear. No acute consolidation or lung collapse. Bilateral costophrenic angles are clear. Stable cardio-mediastinal silhouette. Sternotomy wires noted, status post CABG. No acute osseous abnormalities. The soft tissues are within normal limits. IMPRESSION: *No acute cardiopulmonary abnormality.  Interstitial lung disease. Electronically Signed   By: Jules Schick M.D.   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)   Essential hypertension   Interstitial lung disease (HCC)   Elevated troponin   SIRS (systemic inflammatory response syndrome) (HCC)   Leukocytosis   Chronic iron deficiency anemia   Allergic rhinitis   Acquired hypothyroidism   Depression   GAD (generalized anxiety disorder)    #) Acute hypoxic respiratory failure due to acute exacerbation of interstitial lung disease secondary to acute flare of Wegener's granulomatosis: in the context of acute  respiratory symptoms and no known baseline scheduled supplemental O2 requirements, presenting O2 sat was in the low 70s, Sosan improving into the mid 90s to 100% on 6 L salter high flow nasal cannula. Appears to be on basis of acute exacerbation of his underlying interstitial lung disease as a consequence of acute flare of his Wegener's granulomatosis, with the patient reporting very similar presentation in February 2024, which improved with high-dose systemic corticosteroids. HPI that includes timing of recent further weaning of his chronic steroid therapy leading up to the worsening shortness of breath also appears consistent with exacerbation of interstitial lung disease secondary to acute flare of lingers granulomatosis, noting that his shortness of breath began to worsen once weaning process of his chronic daily prednisone hit a 10 mg daily threshold.  In the context of the above, we will proceed with high-dose IV solumedrol, as further outlined below.  No known history of obstructive lung disease.  Presenting CXR shows findings consistent with his chronic interstitial lung disease, will demonstrate no evidence of acute cardiopulmonary process, including no evidence of infiltrate to suggest pneumonia nor any evidence of edema, effusion, or pneumothorax..  Pneumonia rendered further less likely given nonelevated procalcitonin level.  COVID, flu Enza, RSV PCR were all negative.  No clinical or radiographic evidence to suggest acute decompensated heart failure at this time.  His mildly elevated BNP is similar to most recent prior BNP data points, as further quantified above.  Suspect that mildly elevated troponin is on the basis of supply demand mismatch resulting from the decrease in oxygen delivery capacity that is a/w presenting acute hypoxic respiratory distress as opposed to representing a type I process due to acute plaque rupture, particularly given presenting ekg that shows no e/o acute ischemic  changes, including no evidence of STEMI, and the absence of any reported chest pain a/w this presentation. Overall, ACS is felt to be less likely relative to  type 2 supply demand mismatch, as above, but will closely monitor on telemetry overnight while further trending troponin and treating suspected underlying acute hypoxic respiratory failure due to interstitial lung disease exacerbation stemming from acute flare of Wegener's granulomatosis.   Clinically, presentation is less suggestive of acute PE at this time, which is rendered further less likely in the setting of chronic anticoagulation on warfarin, with therapeutic presenting INR.  Of note, there does not appear to be any renal involvement associated with the patient's Wegener's granulomatosis or acute flares thereof, with renal function appearing to be at baseline, as further quantified above.  Plan: monitor on telemetry. CMP/CBC in the AM. Check serum Mg and Phos levels. Check blood gas. incentive spirometry.  Solu-Medrol 80 mg IV twice daily.  Scheduled duo nebulizer treatments, as needed albuterol nebulizers.  Repeat troponin in the morning.                  #) SIRS criteria present: Presentation associated with mildly elevated blood cell count of 13,800, which is consistent with his chronic leukocytosis in the setting of chronic prednisone therapy, and is actually trending down relative to most recent prior white cell count of 16,800 when checked in September 2024.  Presentation also notable for tachypnea, which appears consistent with his presenting interstitial lung disease exacerbation secondary to acute flare of Wegener's granulomatosis resulting in acute hypoxic respiratory failure, as above.  in the absence of e/o underlying infection at this time, including, no evidence of pneumonia, with on the basis of the absence of acute process on chest x-ray as well as nonelevated procalcitonin, will also noting that COVID, influenza,  RSV PCR also negative, criteria for sepsis not currently met.  Overall, suspect non-infectious factors contributing to these SIRS findings,, as above.  patient appears hemodynamically stable at this time.  Consequently, will refrain from initiation of IV antibiotics at this time.  Continued trending of lipid cell count may demonstrate progression of leukocytosis given plan for high-dose systemic corticosteroids as component of management for his acute flare of Wegener's granulomatosis.  Plan: Repeat CBC with diff in the AM.  Monitor strict I's and O's and daily weights.  Monitor on telemetry. Refraining from IV abx for now, as above.  Check urinalysis.  Further evaluation management of presenting acute hypoxic respiratory failure in the setting of acute interstitial lung disease exacerbation stemming from acute flare of Wegener's granulomatosis, as above.                      #) Chronic iron deficiency anemia: Documented history of such, on daily iron supplementation as an outpatient, and a/w with baseline hgb range 10-13, with presenting hgb consistent with this range, in the absence of any overt evidence of active bleed.  INR found to be therapeutic at 2.4 in the setting of chronic anticoagulation on warfarin.   Plan: Repeat CBC in the morning.                    #) Allergic Rhinitis: documented h/o such, on scheduled Claritin as an outpatient, in the absence of any use of intranasal Flonase.    Plan: cont home Claritin.                    #) Type 2 Diabetes Mellitus: documented history of such. Home insulin regimen: None. Home oral hypoglycemic agents: Metformin.  Also on Ozempic at home.  Presenting blood sugar: 125. Most recent A1c noted to  be 6.5% when checked on 09/18/2022.  Will closely follow ensuing CBG trend given increased risk for relative hyperglycemia in the setting of plan for systemic corticosteroids, as above.  Plan: accuchecks  QAC and HS with medium dose SSI.  Add on hemoglobin A1c.  Hold home metformin and Ozempic during this hospitalization.                  #) Chronic diastolic heart failure: documented history of such, with most recent echocardiogram performed on 12/07/2022, which was notable for LVEF 66 5%, no evidence of wall motion abnormalities, grade 1 diastolic dysfunction and normal right ventricular systolic function. No clinical radiographic or laboratory evidence to suggest acutely decompensated heart failure at this time. home diuretic regimen reportedly consists of the following: Lasix, but on a as needed basis only.  In the setting of his history of pulmonary hypertension.  Spacing into right-sided heart failure, will refrain from aggressive diuresis efforts.  Plan: monitor strict I's & O's and daily weights. Repeat CMP in AM. Check serum mag level.                   #) Paroxysmal atrial fibrillation: Documented history of such. In setting of CHA2DS2-VASc score of 5, there is an indication for chronic anticoagulation for thromboembolic prophylaxis. Consistent with this, patient is chronically anticoagulated on warfarin, with presenting INR found to be therapeutic at 2.4. Home AV nodal blocking regimen: Coreg.  He is also on Tikosyn as an outpatient.  Most recent echocardiogram was performed on 12/07/2022 and was notable for moderately dilated left atrium, severely dilated right atrium and trivial mitral regurgitation, with additional results as conveyed above. Presenting EKG sinus rhythm without overt evidence of acute ischemic changes.  Will closely monitor on telemetry given increased risk for ensuing development of atrial fibrillation with RVR in the setting of plan for scheduled duo nebulizer treatments, prn albuterol nebulizers as well as high-dose systemic corticosteroids.  Plan: monitor strict I's & O's and daily weights. CMP/CBC in AM. Check serum mag level. Continue home AV  nodal blocking regimen.  Continue outpatient Tikosyn.  Inpatient pharmacy consulted for assistance with inpatient dosing of warfarin.  Monitor on telemetry.                    #) acquired hypothyroidism: documented h/o such, on Synthroid as outpatient.   Plan: cont home Synthroid.                     #) Essential Hypertension: documented h/o such, with outpatient antihypertensive regimen including Coreg.  SBP's in the ED today: 120s to 160s mmHg.   Plan: Close monitoring of subsequent BP via routine VS. continue outpatient Coreg.                    #) Depression: documented h/o such. On Effexor as outpatient.    Plan: Continue outpatient Effexor.                  #) Generalized anxiety disorder: documented h/o such. On Effexor as well as scheduled Xanax on a nightly basis as outpatient.    Plan: Continue outpatient Effexor.  Continue outpatient qhs Xanax, but will change from scheduled to prn for now.         DVT prophylaxis: SCD's plus continuation of outpatient warfarin via inpatient pharmacy consultation, as above Code Status: Full code Family Communication: none Disposition Plan: Per Rounding Team Consults called: none;  Admission  status: Inpatient     I SPENT GREATER THAN 75  MINUTES IN CLINICAL CARE TIME/MEDICAL DECISION-MAKING IN COMPLETING THIS ADMISSION.      Chaney Born Gary Brensinger DO [facility]  From 7PM - 7AM   01/02/2023, 8:05 PM

## 2023-01-02 NOTE — ED Notes (Signed)
[place] with  cl called for transport 

## 2023-01-02 NOTE — ED Notes (Addendum)
Stands for urinal use. WOB increases with standing. Remains on [place] throughout. SpO2 drops to  75% while standing. Coach on breathing after returning to bed. Returns to 91% after 2 minutes.  RT to eval.

## 2023-01-02 NOTE — Progress Notes (Signed)
PHARMACY - ANTICOAGULATION CONSULT NOTE  Pharmacy Consult for warfarin Indication: atrial fibrillation  Allergies  Allergen Reactions   Farxiga [Dapagliflozin] Other (See Comments)    Lightheadedness Low blood pressure   Statins Other (See Comments)    Myalgias     Patient Measurements: Weight: 101.2 kg (223 lb) Heparin Dosing Weight: n/a  Vital Signs: Temp: 98 F (36.7 C) (12/29 1039) Temp Source: Oral (12/29 1039) BP: 130/62 (12/29 1100) Pulse Rate: 60 (12/29 1100)  Labs: Recent Labs    01/01/23 1541 01/01/23 1747 01/01/23 1953  HGB 10.6*  --   --   HCT 35.9*  --   --   PLT 455*  --   --   LABPROT 26.5*  --   --   INR 2.4*  --   --   CREATININE 0.91  --   --   TROPONINIHS 32* 75* 104*    Estimated Creatinine Clearance: 76.5 mL/min (by C-G formula based on SCr of 0.91 mg/dL).   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 Pharmacy consulted to manage/dose warfarin.   Last dose 12/28 @ 0800  PTA regimen: 2.5mg  on MWF, 5mg  on all other days  INR 12/28 @ 1541  was 2.4 - therapeutic   Goal of Therapy:  INR 2-3 Monitor platelets by anticoagulation protocol: Yes   Plan:  Warfarin 5mg  today Daily INR, CBC F/u s/sx bleeding  F/u new DDIs  Calton Dach, PharmD, BCCCP Clinical Pharmacist 01/02/2023 11:41 AM

## 2023-01-02 NOTE — ED Notes (Signed)
RN increased oxygen level to 8L d/t oxygen saturation being below 90%. Provider made aware.

## 2023-01-03 DIAGNOSIS — J9601 Acute respiratory failure with hypoxia: Secondary | ICD-10-CM | POA: Diagnosis not present

## 2023-01-03 LAB — COMPREHENSIVE METABOLIC PANEL
ALT: 16 U/L (ref 0–44)
AST: 22 U/L (ref 15–41)
Albumin: 2.8 g/dL — ABNORMAL LOW (ref 3.5–5.0)
Alkaline Phosphatase: 85 U/L (ref 38–126)
Anion gap: 9 (ref 5–15)
BUN: 22 mg/dL (ref 8–23)
CO2: 25 mmol/L (ref 22–32)
Calcium: 9 mg/dL (ref 8.9–10.3)
Chloride: 106 mmol/L (ref 98–111)
Creatinine, Ser: 0.89 mg/dL (ref 0.61–1.24)
GFR, Estimated: >60 mL/min (ref >60)
Glucose, Bld: 147 mg/dL — ABNORMAL HIGH (ref 70–99)
Potassium: 4.2 mmol/L (ref 3.5–5.1)
Sodium: 140 mmol/L (ref 135–145)
Total Bilirubin: 0.7 mg/dL (ref <1.2)
Total Protein: 5.7 g/dL — ABNORMAL LOW (ref 6.5–8.1)

## 2023-01-03 LAB — BLOOD GAS, VENOUS
Acid-Base Excess: 2.3 mmol/L — ABNORMAL HIGH (ref 0.0–2.0)
Bicarbonate: 26.5 mmol/L (ref 20.0–28.0)
O2 Saturation: 98.5 %
Patient temperature: 36.8
pCO2, Ven: 39 mm[Hg] — ABNORMAL LOW (ref 44–60)
pH, Ven: 7.44 — ABNORMAL HIGH (ref 7.25–7.43)
pO2, Ven: 84 mm[Hg] — ABNORMAL HIGH (ref 32–45)

## 2023-01-03 LAB — CBC WITH DIFFERENTIAL/PLATELET
Abs Immature Granulocytes: 0.1 10*3/uL — ABNORMAL HIGH (ref 0.00–0.07)
Basophils Absolute: 0 10*3/uL (ref 0.0–0.1)
Basophils Relative: 0 %
Eosinophils Absolute: 0 10*3/uL (ref 0.0–0.5)
Eosinophils Relative: 0 %
HCT: 30.9 % — ABNORMAL LOW (ref 39.0–52.0)
Hemoglobin: 9.3 g/dL — ABNORMAL LOW (ref 13.0–17.0)
Immature Granulocytes: 1 %
Lymphocytes Relative: 2 %
Lymphs Abs: 0.4 10*3/uL — ABNORMAL LOW (ref 0.7–4.0)
MCH: 24.1 pg — ABNORMAL LOW (ref 26.0–34.0)
MCHC: 30.1 g/dL (ref 30.0–36.0)
MCV: 80.1 fL (ref 80.0–100.0)
Monocytes Absolute: 0.7 10*3/uL (ref 0.1–1.0)
Monocytes Relative: 4 %
Neutro Abs: 16 10*3/uL — ABNORMAL HIGH (ref 1.7–7.7)
Neutrophils Relative %: 93 %
Platelets: 432 10*3/uL — ABNORMAL HIGH (ref 150–400)
RBC: 3.86 MIL/uL — ABNORMAL LOW (ref 4.22–5.81)
RDW: 18.6 % — ABNORMAL HIGH (ref 11.5–15.5)
WBC: 17.3 10*3/uL — ABNORMAL HIGH (ref 4.0–10.5)
nRBC: 0 % (ref 0.0–0.2)

## 2023-01-03 LAB — GLUCOSE, CAPILLARY
Glucose-Capillary: 142 mg/dL — ABNORMAL HIGH (ref 70–99)
Glucose-Capillary: 128 mg/dL — ABNORMAL HIGH (ref 70–99)
Glucose-Capillary: 154 mg/dL — ABNORMAL HIGH (ref 70–99)
Glucose-Capillary: 147 mg/dL — ABNORMAL HIGH (ref 70–99)

## 2023-01-03 LAB — SEDIMENTATION RATE: Sed Rate: 53 mm/h — ABNORMAL HIGH (ref 0–16)

## 2023-01-03 LAB — PROTIME-INR
INR: 2.3 — ABNORMAL HIGH (ref 0.8–1.2)
Prothrombin Time: 25.1 s — ABNORMAL HIGH (ref 11.4–15.2)

## 2023-01-03 LAB — PHOSPHORUS: Phosphorus: 3.2 mg/dL (ref 2.5–4.6)

## 2023-01-03 LAB — MAGNESIUM: Magnesium: 2 mg/dL (ref 1.7–2.4)

## 2023-01-03 LAB — PROCALCITONIN: Procalcitonin: <0.1 ng/mL

## 2023-01-03 LAB — TROPONIN I (HIGH SENSITIVITY): Troponin I (High Sensitivity): 31 ng/L — ABNORMAL HIGH (ref <18)

## 2023-01-03 MED ORDER — IPRATROPIUM BROMIDE 0.02 % IN SOLN
0.5000 mg | Freq: Four times a day (QID) | RESPIRATORY_TRACT | Status: DC
  Administered 2023-01-03: 0.5 mg via RESPIRATORY_TRACT
  Filled 2023-01-03: qty 2.5

## 2023-01-03 MED ORDER — SODIUM CHLORIDE 0.9 % IV SOLN: INTRAVENOUS | Status: DC

## 2023-01-03 MED ORDER — LEVALBUTEROL HCL 1.25 MG/0.5ML IN NEBU
1.2500 mg | INHALATION_SOLUTION | Freq: Four times a day (QID) | RESPIRATORY_TRACT | Status: DC
  Administered 2023-01-03: 1.25 mg via RESPIRATORY_TRACT
  Filled 2023-01-03: qty 0.5

## 2023-01-03 MED ORDER — LEVALBUTEROL HCL 1.25 MG/0.5ML IN NEBU: 1.2500 mg | INHALATION_SOLUTION | RESPIRATORY_TRACT | Status: DC | PRN

## 2023-01-03 MED ORDER — WARFARIN SODIUM 2.5 MG PO TABS
2.5000 mg | ORAL_TABLET | Freq: Once | ORAL | Status: AC
  Administered 2023-01-03: 2.5 mg via ORAL
  Filled 2023-01-03: qty 1

## 2023-01-03 MED ORDER — FUROSEMIDE 10 MG/ML IJ SOLN
40.0000 mg | Freq: Two times a day (BID) | INTRAMUSCULAR | Status: DC
  Administered 2023-01-03 – 2023-01-04 (×3): 40 mg via INTRAVENOUS
  Filled 2023-01-03 (×3): qty 4

## 2023-01-03 MED ORDER — FUROSEMIDE 40 MG PO TABS: 40.0000 mg | ORAL_TABLET | Freq: Every day | ORAL | Status: DC

## 2023-01-03 NOTE — Progress Notes (Addendum)
PROGRESS NOTE    Gary [name]  YQM:[id number] DOB: 23-Jun-1945 DOA: 01/01/2023 PCP: Charlane Ferretti, DO  77/M with history of Wegener's granulomatosis, interstitial lung disease, former smoker, CAD/CABG, paroxysmal A-fib on warfarin, diastolic CHF was hospitalized in 9/24 with presumed flare of ILD/Wegener's, treated with high-dose steroids, subsequently has been weaned down to prednisone 10 mg daily recently.  Presented to the ED 12/28 with progressive dyspnea on exertion, cough, no fevers or chills, no edema orthopnea, reports worsening hypoxia at home. In the ED he was hypoxic initial O2 sats of 72%, placed on high flow, mildly tachypneic, labs noted creatinine 0.9, BNP 513, troponin 32, 75, 104, procalcitonin less than 0.1, WBC 13.8 chest x-ray with chronic findings of interstitial lung disease -Admitted, started on IV steroids  Subjective: -No changes so far  Assessment and Plan:  Acute hypoxic respiratory failure ILD flare suspected -On prednisone taper following recent exacerbation, followed by pulmonary and rheumatology -Flu/COVID/RSV PCR negative, do not see signs of infection or volume overload at this time -Continue IV Solu-Medrol today, Xopenex nebs, incentive spirometry -Pulmonary consult  Leukocytosis Likely reactive, worse on steroids, afebrile, PCT< 0.1  Chronic diastolic CHF Pulm Hypertension -Recent echo 12/24 with EF 65%, grade 1 DD, normal RV, Pulm HTN RVSP 59 -Clinically does not appear volume overloaded now -On Lasix PRN at baseline, resume daily to maintain net negative status  Type 2 diabetes mellitus -Metformin and Ozempic on hold -CBG stable so far, if starts trending up on steroids will need long-acting insulin  Chronic deficiency anemia -Relatively stable,  Paroxysmal atrial fibrillation -Continue Coreg, Tikosyn, warfarin per pharmacy -K is 4.2, mag is 2.0  Hypothyroidism Continue Synthroid  Depression  Generalized anxiety continue  Effexor  DVT prophylaxis: Warfarin Code Status: Full code Family Communication: None present Disposition Plan: Home pending improvement in hypoxia  Consultants:    Procedures:   Antimicrobials:    Objective: Vitals:   01/03/23 0525 01/03/23 0741 01/03/23 0805 01/03/23 0858  BP: 137/62  (!) 126/56   Pulse: 69 70 89 76  Resp: (!) 25 18 18 20   Temp: 97.8 F (36.6 C)  97.8 F (36.6 C)   TempSrc: Oral  Oral   SpO2: 95% 95% 91% 91%  Weight:      Height:        Intake/Output Summary (Last 24 hours) at 01/03/2023 1009 Last data filed at 01/03/2023 0524 Gross per 24 hour  Intake --  Output 350 ml  Net -350 ml   Filed Weights   01/01/23 1533 01/02/23 1853  Weight: 101.2 kg 98.8 kg    Examination:  General exam: Elderly chronically ill male sitting up in bed, AAOx3 HEENT: No JVD CVS: S1-S2, regular rhythm Lungs: Fine bilateral Rales Abdomen: Soft, nontender, bowel sounds present Extremities: No edema  Skin: No rashes Psychiatry:  Mood & affect appropriate.     Data Reviewed:   CBC: Recent Labs  Lab 01/01/23 1541 01/03/23 0245  WBC 13.8* 17.3*  NEUTROABS  --  16.0*  HGB 10.6* 9.3*  HCT 35.9* 30.9*  MCV 81.8 80.1  PLT 455* 432*   Basic Metabolic Panel: Recent Labs  Lab 01/01/23 1541 01/02/23 2104 01/03/23 0245  NA 142  --  140  K 4.3  --  4.2  CL 106  --  106  CO2 25  --  25  GLUCOSE 125*  --  147*  BUN 20  --  22  CREATININE 0.91  --  0.89  CALCIUM 10.0  --  9.0  MG  --  2.0 2.0  PHOS  --   --  3.2   GFR: Estimated Creatinine Clearance: 79.3 mL/min (by C-G formula based on SCr of 0.89 mg/dL). Liver Function Tests: Recent Labs  Lab 01/03/23 0245  AST 22  ALT 16  ALKPHOS 85  BILITOT 0.7  PROT 5.7*  ALBUMIN 2.8*   No results for input(s): "LIPASE", "AMYLASE" in the last 168 hours. No results for input(s): "AMMONIA" in the last 168 hours. Coagulation Profile: Recent Labs  Lab 01/01/23 1541 01/03/23 0245  INR 2.4* 2.3*    Cardiac Enzymes: No results for input(s): "CKTOTAL", "CKMB", "CKMBINDEX", "TROPONINI" in the last 168 hours. BNP (last 3 results) Recent Labs    11/04/22 1519  PROBNP 416.0*   HbA1C: No results for input(s): "HGBA1C" in the last 72 hours. CBG: Recent Labs  Lab 01/02/23 0922 01/02/23 1204 01/02/23 1819 01/02/23 2112 01/03/23 0649  GLUCAP 147* 132* 128* 96 142*   Lipid Profile: No results for input(s): "CHOL", "HDL", "LDLCALC", "TRIG", "CHOLHDL", "LDLDIRECT" in the last 72 hours. Thyroid Function Tests: No results for input(s): "TSH", "T4TOTAL", "FREET4", "T3FREE", "THYROIDAB" in the last 72 hours. Anemia Panel: No results for input(s): "VITAMINB12", "FOLATE", "FERRITIN", "TIBC", "IRON", "RETICCTPCT" in the last 72 hours. Urine analysis:    Component Value Date/Time   COLORURINE YELLOW 09/17/2022 2236   APPEARANCEUR HAZY (A) 09/17/2022 2236   LABSPEC 1.010 09/17/2022 2236   PHURINE 6.5 09/17/2022 2236   GLUCOSEU NEGATIVE 09/17/2022 2236   HGBUR NEGATIVE 09/17/2022 2236   BILIRUBINUR NEGATIVE 09/17/2022 2236   KETONESUR NEGATIVE 09/17/2022 2236   PROTEINUR NEGATIVE 09/17/2022 2236   UROBILINOGEN 0.2 12/24/2011 0805   NITRITE NEGATIVE 09/17/2022 2236   LEUKOCYTESUR SMALL (A) 09/17/2022 2236   Sepsis Labs: @LABRCNTIP (procalcitonin:4,lacticidven:4)  ) Recent Results (from the past 240 hours)  Resp panel by RT-PCR (RSV, Flu A&B, Covid) Anterior Nasal Swab     Status: None   Collection Time: 01/01/23  3:41 PM   Specimen: Anterior Nasal Swab  Result Value Ref Range Status   SARS Coronavirus 2 by RT PCR NEGATIVE NEGATIVE Final    Comment: (NOTE) SARS-CoV-2 target nucleic acids are NOT DETECTED.  The SARS-CoV-2 RNA is generally detectable in upper respiratory specimens during the acute phase of infection. The lowest concentration of SARS-CoV-2 viral copies this assay can detect is 138 copies/mL. A negative result does not preclude SARS-Cov-2 infection and should  not be used as the sole basis for treatment or other patient management decisions. A negative result may occur with  improper specimen collection/handling, submission of specimen other than nasopharyngeal swab, presence of viral mutation(s) within the areas targeted by this assay, and inadequate number of viral copies(<138 copies/mL). A negative result must be combined with clinical observations, patient history, and epidemiological information. The expected result is Negative.  Fact Sheet for Patients:  BloggerCourse.com  Fact Sheet for Healthcare Providers:  SeriousBroker.it  This test is no t yet approved or cleared by the [place] FDA and  has been authorized for detection and/or diagnosis of SARS-CoV-2 by FDA under an Emergency Use Authorization (EUA). This EUA will remain  in effect (meaning this test can be used) for the duration of the COVID-19 declaration under Section 564(b)(1) of the Act, 21 U.S.C.section 360bbb-3(b)(1), unless the authorization is terminated  or revoked sooner.       Influenza A by PCR NEGATIVE NEGATIVE Final   Influenza B by PCR NEGATIVE NEGATIVE Final    Comment: (NOTE) The  Xpert Xpress SARS-CoV-2/FLU/RSV plus assay is intended as an aid in the diagnosis of influenza from Nasopharyngeal swab specimens and should not be used as a sole basis for treatment. Nasal washings and aspirates are unacceptable for Xpert Xpress SARS-CoV-2/FLU/RSV testing.  Fact Sheet for Patients: BloggerCourse.com  Fact Sheet for Healthcare Providers: SeriousBroker.it  This test is not yet approved or cleared by the [place] FDA and has been authorized for detection and/or diagnosis of SARS-CoV-2 by FDA under an Emergency Use Authorization (EUA). This EUA will remain in effect (meaning this test can be used) for the duration of the COVID-19 declaration under  Section 564(b)(1) of the Act, 21 U.S.C. section 360bbb-3(b)(1), unless the authorization is terminated or revoked.     Resp Syncytial Virus by PCR NEGATIVE NEGATIVE Final    Comment: (NOTE) Fact Sheet for Patients: BloggerCourse.com  Fact Sheet for Healthcare Providers: SeriousBroker.it  This test is not yet approved or cleared by the [place] FDA and has been authorized for detection and/or diagnosis of SARS-CoV-2 by FDA under an Emergency Use Authorization (EUA). This EUA will remain in effect (meaning this test can be used) for the duration of the COVID-19 declaration under Section 564(b)(1) of the Act, 21 U.S.C. section 360bbb-3(b)(1), unless the authorization is terminated or revoked.  Performed at [facility], [address], [place], [place] [address]      Radiology Studies: [facility] Chest [facility] 1 View Result Date: 01/01/2023 CLINICAL DATA:  Shortness of breath. EXAM: PORTABLE CHEST 1 VIEW COMPARISON:  09/17/2022. FINDINGS: Low lung volume. There are diffuse increased interstitial markings, less pronounced than the prior radiograph from 10/04/2022, which corresponds to chronic interstitial lung disease on recent chest CT scan from 11/05/2022. Bilateral lung fields are otherwise clear. No acute consolidation or lung collapse. Bilateral costophrenic angles are clear. Stable cardio-mediastinal silhouette. Sternotomy wires noted, status post CABG. No acute osseous abnormalities. The soft tissues are within normal limits. IMPRESSION: *No acute cardiopulmonary abnormality.  Interstitial lung disease. Electronically Signed   By: Jules Schick M.D.   On: 01/01/2023 17:19     Scheduled Meds:  aspirin  81 mg Oral Daily   carvedilol  12.5 mg Oral BID   dofetilide  500 mcg Oral BID   feeding supplement  237 mL Oral BID BM   ferrous sulfate  325 mg Oral Daily   insulin aspart  0-15 Units Subcutaneous TID WC    insulin aspart  0-5 Units Subcutaneous QHS   ipratropium  0.5 mg Nebulization Q6H   levalbuterol  1.25 mg Nebulization Q6H   levothyroxine  137 mcg Oral QAC breakfast   loratadine  10 mg Oral Daily   methylPREDNISolone (SOLU-MEDROL) injection  80 mg Intravenous Q12H   pantoprazole  40 mg Oral Daily   pneumococcal 20-valent conjugate vaccine  0.5 mL Intramuscular Tomorrow-1000   venlafaxine XR  150 mg Oral Q breakfast   warfarin  2.5 mg Oral ONCE-1600   Warfarin - Pharmacist Dosing Inpatient   Does not apply q1600   Continuous Infusions:   LOS: 1 day    Time spent:    Zannie Cove, MD [facility]   01/03/2023, 10:09 AM

## 2023-01-03 NOTE — Plan of Care (Signed)

## 2023-01-03 NOTE — Progress Notes (Signed)
PHARMACY - ANTICOAGULATION CONSULT NOTE  Pharmacy Consult for warfarin Indication: atrial fibrillation  Allergies  Allergen Reactions   Farxiga [Dapagliflozin] Other (See Comments)    Lightheadedness Low blood pressure   Statins Other (See Comments)    Myalgias     Patient Measurements: Height: 5\' 10"  (177.8 cm) Weight: 98.8 kg (217 lb 13 oz) IBW/kg (Calculated) : 73 Heparin Dosing Weight: n/a  Vital Signs: Temp: 97.8 F (36.6 C) (12/30 0525) Temp Source: Oral (12/30 0525) BP: 137/62 (12/30 0525) Pulse Rate: 70 (12/30 0741)  Labs: Recent Labs    01/01/23 1541 01/01/23 1747 01/01/23 1953 01/03/23 0245  HGB 10.6*  --   --  9.3*  HCT 35.9*  --   --  30.9*  PLT 455*  --   --  432*  LABPROT 26.5*  --   --  25.1*  INR 2.4*  --   --  2.3*  CREATININE 0.91  --   --  0.89  TROPONINIHS 32* 75* 104*  --     Estimated Creatinine Clearance: 79.3 mL/min (by C-G formula based on SCr of 0.89 mg/dL).   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 Pharmacy consulted to manage/dose warfarin.   Last dose 12/28 @ 0800  PTA regimen: 2.5mg  on MWF, 5mg  on all other days  INR today is therapeutic at 2.3. Patient was scheduled to receive warfarin 5 mg PO x 1 dose on 12/29. It is unclear if the patient received that dose, as there is no documentation on the [facility] but the patient did tell the RN that he received the 5 mg dose on 12/29 at the  [facility] ED prior to transferring to [facility]. In light of this information, will dose according to PTA regimen so as not to overshoot therapeutic range.   Hgb slightly down to 9.3 and PLT count stable. No documentation of PO intake per chart review.  Of note, patient is now on high-dose methylprednisolone which has been documented to both increase and decrease warfarin's efficacy (increase and decrease INR). Will monitor INR closely. No other DDIs identified.   Goal of Therapy:  INR 2-3 Monitor platelets by anticoagulation protocol: Yes   Plan:  Warfarin 2.5 mg x1 dose today Daily INR, CBC F/u s/sx bleeding  F/u new DDIs  Lennie Muckle, PharmD PGY1 Pharmacy Resident 01/03/2023 8:02 AM

## 2023-01-03 NOTE — Consult Note (Addendum)
NAME:  Gary [name], MRN:  [id number], DOB:  1945/12/12, LOS: 1 ADMISSION DATE:  01/01/2023, CONSULTATION DATE:  12/30 REFERRING MD:  Jomarie Longs, CHIEF COMPLAINT:  Wegners/ ILD Flare  History of Present Illness:  77/M with history of Wegener's granulomatosis, interstitial lung disease, former smoker, CAD/CABG, paroxysmal A-fib compliant on warfarin, diastolic CHF was hospitalized in 9/24 with presumed flare of ILD/Wegener's, treated with high-dose steroids, subsequently has been weaned down to prednisone 10 mg daily recently. Pt. Is also getting Rituxan infusions for his Wegners. He states that the last time he flared, it was 3 weeks  before his next Rituxan Infusion, as it is with this current flare. He feels this is a pattern.   Pt. Presented to the ED 12/28 with progressive dyspnea on exertion, cough, no fevers or chills, no edema orthopnea, reports worsening hypoxia at home x 1 week.  In the ED he was hypoxic initial O2 sats of 72%, placed on high flow, mildly tachypneic, labs noted creatinine 0.9, BNP 513, troponin 32, 75, 104, procalcitonin less than 0.1, WBC 13.8( suspect related to his chronic steroid use)  chest x-ray with chronic findings of interstitial lung disease, Covid and RSV negative -Admitted, started on IV steroids by [facility]  [facility] have asked for a pulmonary consult to ensure they are managing this flare as [name] would recommend.   Of note, He has history of Wegener's diagnosed 5 years ago.  He has been on Rituxan for the past 5 years and chronic prednisone at 5 mg [initially on 60 mg of prednisone].  The Rituxan dose was increased 1 year ago due to poor control of symptoms   He used to run a Administrator, sports.  Quit smoking at the age of 26.  No significant exposures, travel history.  No family history of lung disease.  He was started on portable oxygen through the CPAP and with exertion 6 months ago during hospitalization for pneumonia.  Pertinent  Medical  History   hypoxic respiratory failure Wegener's granulomatosis ILD HFrEF  CAF on warfarin Coronary artery disease Hyperlipidemia Type 2 diabetes Hypertension OSA PAD   Significant Hospital Events: Including procedures, antibiotic start and stop dates in addition to other pertinent events   12/29 admission for hypoxic respiratory Failure  Interim History / Subjective:  Currently on 2 L [place] with sats of 96% Short of breath with speech Plan is for a right heart cath this coming Monday ( 1 week from today), This has been moved forward to be done as Inpatient 12/30 Objective   Blood pressure (!) 126/56, pulse 76, temperature 97.8 F (36.6 C), temperature source Oral, resp. rate 20, height 5\' 10"  (1.778 m), weight 98.8 kg, SpO2 91%.        Intake/Output Summary (Last 24 hours) at 01/03/2023 1044 Last data filed at 01/03/2023 0800 Gross per 24 hour  Intake --  Output 550 ml  Net -550 ml   Filed Weights   01/01/23 1533 01/02/23 1853  Weight: 101.2 kg 98.8 kg    Examination: General:  Awake and alert, states she is not quite back to her baseline. In NAD HENT:  NCAT, No LAD, No JVD Lungs:  Bilateral chest excursion, Distant, Diminished per bases , L>R.  Cardiovascular:  S1, S2, RRR, No RMG Abdomen:  Soft, NT, ND, BS +, Body mass index is 31.25 kg/m.  Extremities: No edema, No obvious deformities Neuro:  Awake, alert and oriented GU:  Voids independently  Resolved Hospital Problem list  Assessment & Plan:  Acute Hypoxic Respiratory Failure in setting of acute exacerbation of Wegner's/ ILD Flare Mildly elevated troponin BNP of 518 on admission  Pt 3 weeks shy of next Rituxan infusion, this is a pattern per the patient  Currently on Oxygen at 2 L ( Down from 6 L initially) Plan Continue supplemental oxygen and continuous oxygen saturation monitoring Change Lasix to IV still 40 mg BID Trend BNP daily Pt. Scheduled for right heart cath 12/31, this has been moved  forward as was scheduled as OP for 01/09/2022 ( Dr. Shirlee Latch) to eval for [facility]. Continue Solumedrol 80 mg BID Q 12, titrate down as patient improves Continue Rituxan ? If  it dosage can be increased? Was last increased 12 months ago for poor control of symptoms>> per rheumatology Consider starting Cellcept as OP    Best Practice (right click and "Reselect all SmartList Selections" daily)   Best Practice per Primary Team Labs   CBC: Recent Labs  Lab 01/01/23 1541 01/03/23 0245  WBC 13.8* 17.3*  NEUTROABS  --  16.0*  HGB 10.6* 9.3*  HCT 35.9* 30.9*  MCV 81.8 80.1  PLT 455* 432*    Basic Metabolic Panel: Recent Labs  Lab 01/01/23 1541 01/02/23 2104 01/03/23 0245  NA 142  --  140  K 4.3  --  4.2  CL 106  --  106  CO2 25  --  25  GLUCOSE 125*  --  147*  BUN 20  --  22  CREATININE 0.91  --  0.89  CALCIUM 10.0  --  9.0  MG  --  2.0 2.0  PHOS  --   --  3.2   GFR: Estimated Creatinine Clearance: 79.3 mL/min (by C-G formula based on SCr of 0.89 mg/dL). Recent Labs  Lab 01/01/23 1541 01/01/23 2156 01/03/23 0245  PROCALCITON  --  <0.10  --   WBC 13.8*  --  17.3*    Liver Function Tests: Recent Labs  Lab 01/03/23 0245  AST 22  ALT 16  ALKPHOS 85  BILITOT 0.7  PROT 5.7*  ALBUMIN 2.8*   No results for input(s): "LIPASE", "AMYLASE" in the last 168 hours. No results for input(s): "AMMONIA" in the last 168 hours.  ABG    Component Value Date/Time   HCO3 26.5 01/03/2023 0245   TCO2 27 09/17/2022 1512   O2SAT 98.5 01/03/2023 0245     Coagulation Profile: Recent Labs  Lab 01/01/23 1541 01/03/23 0245  INR 2.4* 2.3*    Cardiac Enzymes: No results for input(s): "CKTOTAL", "CKMB", "CKMBINDEX", "TROPONINI" in the last 168 hours.  HbA1C: Hgb A1c MFr Bld  Date/Time Value Ref Range Status  09/18/2022 07:06 AM 6.5 (H) 4.8 - 5.6 % Final    Comment:    (NOTE)         Prediabetes: 5.7 - 6.4         Diabetes: >6.4         Glycemic control for adults with  diabetes: <7.0   03/02/2022 12:39 AM 6.8 (H) 4.8 - 5.6 % Final    Comment:    (NOTE)         Prediabetes: 5.7 - 6.4         Diabetes: >6.4         Glycemic control for adults with diabetes: <7.0     CBG: Recent Labs  Lab 01/02/23 0922 01/02/23 1204 01/02/23 1819 01/02/23 2112 01/03/23 0649  GLUCAP 147* 132* 128* 96 142*  Review of Systems:   Dyspnea with minimal exertion  Past Medical History:  He,  has a past medical history of Aortic aneurysm (HCC) (02/20/2016), Arthritis, Atrial fibrillation (HCC) (11/13/2014), Cancer (HCC), Chronic combined systolic and diastolic heart failure (HCC) (16/10/9602), Coronary artery disease, Depression, Essential hypertension (11/13/2014), Hyperlipidemia (11/13/2014), ILD (interstitial lung disease) (HCC), Sleep apnea, and Wegener's granulomatosis (11/18/2017).   Surgical History:   Past Surgical History:  Procedure Laterality Date   CARDIOVERSION N/A 12/06/2014   Procedure: CARDIOVERSION;  Surgeon: Chilton Si, MD;  Location: [facility] ENDOSCOPY;  Service: Cardiovascular;  Laterality: N/A;   CARDIOVERSION N/A 05/26/2017   Procedure: CARDIOVERSION;  Surgeon: Chilton Si, MD;  Location: [facility] ENDOSCOPY;  Service: Cardiovascular;  Laterality: N/A;   CARDIOVERSION N/A 04/25/2019   Procedure: CARDIOVERSION;  Surgeon: Thurmon Fair, MD;  Location: [facility] ENDOSCOPY;  Service: Cardiovascular;  Laterality: N/A;   CARDIOVERSION N/A 02/26/2022   Procedure: CARDIOVERSION;  Surgeon: Chilton Si, MD;  Location: [facility] ENDOSCOPY;  Service: Cardiovascular;  Laterality: N/A;   CARDIOVERSION N/A 05/13/2022   Procedure: CARDIOVERSION;  Surgeon: Quintella Reichert, MD;  Location: [facility] INVASIVE CV LAB;  Service: Cardiovascular;  Laterality: N/A;   CARPAL TUNNEL RELEASE     CORONARY ANGIOPLASTY     multiple stents   CORONARY ARTERY BYPASS GRAFT  2006   CORONARY STENT INTERVENTION N/A 09/28/2021   Procedure: CORONARY STENT INTERVENTION;  Surgeon: Runell Gess,  MD;  Location: [facility] INVASIVE CV LAB;  Service: Cardiovascular;  Laterality: N/A;  SVG-PDA   EYE SURGERY     right eye-  muscular repair   LEFT HEART CATH AND CORS/GRAFTS ANGIOGRAPHY N/A 09/28/2021   Procedure: LEFT HEART CATH AND CORS/GRAFTS ANGIOGRAPHY;  Surgeon: Runell Gess, MD;  Location: [facility] INVASIVE CV LAB;  Service: Cardiovascular;  Laterality: N/A;   TOTAL HIP ARTHROPLASTY  01/03/2012   Procedure: TOTAL HIP ARTHROPLASTY;  Surgeon: Jacki Cones, MD;  Location: [facility] ORS;  Service: Orthopedics;  Laterality: Right;     Social History:   reports that he quit smoking about 39 years ago. His smoking use included cigarettes. He has never used smokeless tobacco. He reports that he does not drink alcohol and does not use drugs.   Family History:  His family history includes Heart attack in his maternal grandfather, mother, paternal grandfather, and paternal grandmother; Other in his father; Stroke in his maternal grandmother.   Allergies Allergies  Allergen Reactions   Farxiga [Dapagliflozin] Other (See Comments)    Lightheadedness Low blood pressure   Statins Other (See Comments)    Myalgias      Home Medications  Prior to Admission medications   Medication Sig Start Date End Date Taking? Authorizing Provider  ALPRAZolam Prudy Feeler) 0.5 MG tablet Take 0.5 mg by mouth at bedtime. Takes at 11:00pm   Yes [provider]  aspirin 81 MG chewable tablet Chew 81 mg by mouth daily.   Yes [provider]  Calcium Carb-Cholecalciferol (CALCIUM 600/VITAMIN D3 PO) Take 1 tablet by mouth daily.   Yes [provider]  carvedilol (COREG) 25 MG tablet Take 0.5 tablets (12.5 mg total) by mouth 2 (two) times daily. Patient taking differently: Take 12.5 mg by mouth 2 (two) times daily. Hold dose if SBP <110. 03/06/22  Yes Zannie Cove, MD  Cholecalciferol (VITAMIN D-3) 125 MCG (5000 UT) TABS Take 10,000 Units by mouth daily.   Yes [provider]  Coenzyme Q10 (COQ10  PO) Take 400 mg by mouth daily.   Yes [provider]  dofetilide (TIKOSYN) 500 MCG capsule TAKE 1 CAPSULE BY MOUTH 2 TIMES DAILY. 11/02/22  Yes Eustace Pen, PA-C  Evolocumab (REPATHA SURECLICK) 140 MG/ML SOAJ INJECT 140 MG INTO THE SKIN EVERY 14 (FOURTEEN) DAYS. INJECT 1 PEN INTO THE FATTY TISSUE SKIN EVERY 14 DAY 05/06/22  Yes Chilton Si, MD  ferrous sulfate 325 (65 FE) MG EC tablet Take 325 mg by mouth daily with breakfast.   Yes [provider]  furosemide (LASIX) 40 MG tablet Take 1 tablet (40 mg total) by mouth daily as needed. 05/11/22  Yes Graciella Freer, PA-C  levothyroxine (SYNTHROID, LEVOTHROID) 137 MCG tablet Take 137 mcg by mouth daily before breakfast. 10/27/14  Yes [provider]  loratadine (CLARITIN) 10 MG tablet Take 10 mg by mouth daily.   Yes [provider]  metFORMIN (GLUCOPHAGE) 500 MG tablet Take 500 mg by mouth 2 (two) times daily. 10/27/17  Yes [provider]  Multiple Vitamins-Minerals (CENTRUM SILVER 50+MEN) TABS Take 1 tablet by mouth daily.   Yes [provider]  nitroGLYCERIN (NITROSTAT) 0.4 MG SL tablet Place 1 tablet (0.4 mg total) under the tongue every 5 (five) minutes x 3 doses as needed for chest pain. 09/29/21  Yes Jonita Albee, PA-C  pantoprazole (PROTONIX) 40 MG tablet Take 40 mg by mouth daily.   Yes [provider]  predniSONE (DELTASONE) 5 MG tablet Take 10 mg by mouth daily with breakfast. Tapered Dose 01/10/23 - Start 5 mg   Yes [provider]  Semaglutide (OZEMPIC, 0.25 OR 0.5 MG/DOSE, [place]) Inject 0.5 mg into the skin once a week.   Yes [provider]  testosterone cypionate (DEPOTESTOSTERONE CYPIONATE) 200 MG/ML injection Inject 150 mg into the muscle every 14 (fourteen) days.   Yes [provider]  venlafaxine XR (EFFEXOR-XR) 150 MG 24 hr capsule Take 150 mg by mouth daily.   Yes [provider]  warfarin (COUMADIN) 5 MG tablet TAKE  1/2 TABLET TO 1 TABLET BY MOUTH DAILY AS DIRECTED BY COUMADIN CLINIC Patient taking differently: Take 2.5-5 mg by mouth See admin instructions. TAKE 1/2 TABLET TO 1 TABLET BY MOUTH DAILY AS DIRECTED BY COUMADIN CLINIC; 2.5 mg Mon Wed Fri; 5 mg all other days 11/11/22  Yes Chilton Si, MD  potassium chloride SA (KLOR-CON M) 20 MEQ tablet Take 20 mEq by mouth 2 (two) times daily. Patient not taking: Reported on 01/02/2023    [provider]  riTUXimab (RITUXAN IV) Inject 1 application  into the vein See admin instructions. Take every two weeks and every six months alternating    [provider]         Bevelyn Ngo, MSN, AGACNP-BC [facility] See Amion for personal pager PCCM on call pager [phone]  01/03/2023 11:33 AM

## 2023-01-04 ENCOUNTER — Encounter (HOSPITAL_COMMUNITY)
Admission: EM | Disposition: A | Discharge status: Home Health | Payer: Self-pay | Source: Home / Self Care | Attending: Internal Medicine

## 2023-01-04 DIAGNOSIS — I272 Pulmonary hypertension, unspecified: Secondary | ICD-10-CM

## 2023-01-04 DIAGNOSIS — J9601 Acute respiratory failure with hypoxia: Secondary | ICD-10-CM | POA: Diagnosis not present

## 2023-01-04 HISTORY — PX: RIGHT HEART CATH: CATH118263

## 2023-01-04 LAB — PROTIME-INR
INR: 1.8 — ABNORMAL HIGH (ref 0.8–1.2)
Prothrombin Time: 20.8 s — ABNORMAL HIGH (ref 11.4–15.2)

## 2023-01-04 LAB — HEMOGLOBIN A1C
Hgb A1c MFr Bld: 5.9 % — ABNORMAL HIGH (ref 4.8–5.6)
Mean Plasma Glucose: 123 mg/dL

## 2023-01-04 LAB — BASIC METABOLIC PANEL
Anion gap: 8 (ref 5–15)
BUN: 33 mg/dL — ABNORMAL HIGH (ref 8–23)
CO2: 29 mmol/L (ref 22–32)
Calcium: 9 mg/dL (ref 8.9–10.3)
Chloride: 102 mmol/L (ref 98–111)
Creatinine, Ser: 0.99 mg/dL (ref 0.61–1.24)
GFR, Estimated: >60 mL/min (ref >60)
Glucose, Bld: 202 mg/dL — ABNORMAL HIGH (ref 70–99)
Potassium: 3.6 mmol/L (ref 3.5–5.1)
Sodium: 139 mmol/L (ref 135–145)

## 2023-01-04 LAB — POCT I-STAT EG7
Acid-Base Excess: 7 mmol/L — ABNORMAL HIGH (ref 0.0–2.0)
Acid-Base Excess: 7 mmol/L — ABNORMAL HIGH (ref 0.0–2.0)
Bicarbonate: 31.3 mmol/L — ABNORMAL HIGH (ref 20.0–28.0)
Bicarbonate: 31.7 mmol/L — ABNORMAL HIGH (ref 20.0–28.0)
Calcium, Ion: 1.21 mmol/L (ref 1.15–1.40)
Calcium, Ion: 1.21 mmol/L (ref 1.15–1.40)
HCT: 29 % — ABNORMAL LOW (ref 39.0–52.0)
HCT: 30 % — ABNORMAL LOW (ref 39.0–52.0)
Hemoglobin: 10.2 g/dL — ABNORMAL LOW (ref 13.0–17.0)
Hemoglobin: 9.9 g/dL — ABNORMAL LOW (ref 13.0–17.0)
O2 Saturation: 56 %
O2 Saturation: 57 %
Potassium: 3.8 mmol/L (ref 3.5–5.1)
Potassium: 3.8 mmol/L (ref 3.5–5.1)
Sodium: 141 mmol/L (ref 135–145)
Sodium: 141 mmol/L (ref 135–145)
TCO2: 33 mmol/L — ABNORMAL HIGH (ref 22–32)
TCO2: 33 mmol/L — ABNORMAL HIGH (ref 22–32)
pCO2, Ven: 43.3 mm[Hg] — ABNORMAL LOW (ref 44–60)
pCO2, Ven: 43.8 mm[Hg] — ABNORMAL LOW (ref 44–60)
pH, Ven: 7.463 — ABNORMAL HIGH (ref 7.25–7.43)
pH, Ven: 7.472 — ABNORMAL HIGH (ref 7.25–7.43)
pO2, Ven: 27 mm[Hg] — CL (ref 32–45)
pO2, Ven: 28 mm[Hg] — CL (ref 32–45)

## 2023-01-04 LAB — BRAIN NATRIURETIC PEPTIDE: B Natriuretic Peptide: 255.9 pg/mL — ABNORMAL HIGH (ref 0.0–100.0)

## 2023-01-04 LAB — MAGNESIUM: Magnesium: 2.3 mg/dL (ref 1.7–2.4)

## 2023-01-04 LAB — GLUCOSE, CAPILLARY
Glucose-Capillary: 159 mg/dL — ABNORMAL HIGH (ref 70–99)
Glucose-Capillary: 133 mg/dL — ABNORMAL HIGH (ref 70–99)
Glucose-Capillary: 212 mg/dL — ABNORMAL HIGH (ref 70–99)
Glucose-Capillary: 172 mg/dL — ABNORMAL HIGH (ref 70–99)
Glucose-Capillary: 153 mg/dL — ABNORMAL HIGH (ref 70–99)

## 2023-01-04 LAB — CBC
HCT: 31.8 % — ABNORMAL LOW (ref 39.0–52.0)
Hemoglobin: 9.4 g/dL — ABNORMAL LOW (ref 13.0–17.0)
MCH: 23.8 pg — ABNORMAL LOW (ref 26.0–34.0)
MCHC: 29.6 g/dL — ABNORMAL LOW (ref 30.0–36.0)
MCV: 80.5 fL (ref 80.0–100.0)
Platelets: 483 10*3/uL — ABNORMAL HIGH (ref 150–400)
RBC: 3.95 MIL/uL — ABNORMAL LOW (ref 4.22–5.81)
RDW: 18.1 % — ABNORMAL HIGH (ref 11.5–15.5)
WBC: 13.6 10*3/uL — ABNORMAL HIGH (ref 4.0–10.5)
nRBC: 0 % (ref 0.0–0.2)

## 2023-01-04 LAB — SEDIMENTATION RATE: Sed Rate: 46 mm/h — ABNORMAL HIGH (ref 0–16)

## 2023-01-04 SURGERY — RIGHT HEART CATH
Anesthesia: LOCAL

## 2023-01-04 MED ORDER — LIDOCAINE HCL (PF) 1 % IJ SOLN
INTRAMUSCULAR | Status: DC | PRN
  Administered 2023-01-04: 2 mL via INTRADERMAL

## 2023-01-04 MED ORDER — LIDOCAINE HCL (PF) 1 % IJ SOLN
INTRAMUSCULAR | Status: AC
  Filled 2023-01-04: qty 30

## 2023-01-04 MED ORDER — HEPARIN (PORCINE) IN NACL 1000-0.9 UT/500ML-% IV SOLN
INTRAVENOUS | Status: DC | PRN
  Administered 2023-01-04: 500 mL

## 2023-01-04 MED ORDER — POTASSIUM CHLORIDE CRYS ER 20 MEQ PO TBCR
40.0000 meq | EXTENDED_RELEASE_TABLET | Freq: Once | ORAL | Status: AC
  Administered 2023-01-04: 40 meq via ORAL
  Filled 2023-01-04: qty 2

## 2023-01-04 MED ORDER — WARFARIN SODIUM 5 MG PO TABS
5.0000 mg | ORAL_TABLET | Freq: Once | ORAL | Status: AC
  Administered 2023-01-04: 5 mg via ORAL
  Filled 2023-01-04: qty 1

## 2023-01-04 MED ORDER — FUROSEMIDE 40 MG PO TABS
40.0000 mg | ORAL_TABLET | Freq: Every day | ORAL | Status: DC
  Administered 2023-01-05 – 2023-01-08 (×4): 40 mg via ORAL
  Filled 2023-01-04 (×4): qty 1

## 2023-01-04 SURGICAL SUPPLY — 4 items
CATH BALLN WEDGE 5F 110CM (CATHETERS) IMPLANT
PACK CARDIAC CATHETERIZATION (CUSTOM PROCEDURE TRAY) ×1 IMPLANT
SHEATH GLIDE SLENDER 4/5FR (SHEATH) IMPLANT
TRANSDUCER W/STOPCOCK (MISCELLANEOUS) IMPLANT

## 2023-01-04 NOTE — Progress Notes (Addendum)
 PHARMACY - ANTICOAGULATION CONSULT NOTE  Pharmacy Consult for warfarin Indication: atrial fibrillation  Allergies  Allergen Reactions   Farxiga  [Dapagliflozin ] Other (See Comments)    Lightheadedness Low blood pressure   Statins Other (See Comments)    Myalgias     Patient Measurements: Height: 5' 10 (177.8 cm) Weight: 95.6 kg (210 lb 12.2 oz) IBW/kg (Calculated) : 73 Heparin  Dosing Weight: n/a  Vital Signs: Temp: 97.8 F (36.6 C) (12/31 0405) Temp Source: Oral (12/31 0405) BP: 150/57 (12/31 0405) Pulse Rate: 67 (12/31 0405)  Labs: Recent Labs    01/01/23 1541 01/01/23 1747 01/01/23 1953 01/03/23 0245 01/03/23 0802 01/04/23 0255  HGB 10.6*  --   --  9.3*  --  9.4*  HCT 35.9*  --   --  30.9*  --  31.8*  PLT 455*  --   --  432*  --  483*  LABPROT 26.5*  --   --  25.1*  --  20.8*  INR 2.4*  --   --  2.3*  --  1.8*  CREATININE 0.91  --   --  0.89  --  0.99  TROPONINIHS 32* 75* 104*  --  31*  --     Estimated Creatinine Clearance: 70.2 mL/min (by C-G formula based on SCr of 0.99 mg/dL).   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   LVEF 45-50% 11/2014   Coronary artery disease    Depression    Essential hypertension 11/13/2014   Hyperlipidemia 11/13/2014   ILD (interstitial lung disease) (HCC)    Sleep apnea    CPAP 'Uses half the time  last study 2009   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   Assessment: 77 yo M on warfarin PTA for afib. Pharmacy consulted to manage/dose warfarin. On 12/29, patient was scheduled to receive warfarin 5 mg PO x 1 dose. It is unclear if the patient received that dose, as there is no documentation on the [facility] but the patient did tell the RN that he received the 5 mg dose on 12/29 at the [facility]  ED prior to transferring to [facility]. In light of this information, will dose according to PTA regimen so as not to overshoot therapeutic range.  Last dose prior to admission: 12/28 @ 0800  PTA regimen: 2.5mg  on MWF, 5mg  on all other days  INR slightly subtherapeutic at 1.8. CBC ok - Hgb 9.4, PLTc 483. Nutrition supp added 12/30, poor meal intake.   Of note, patient is now on high-dose methylprednisolone  which has been documented to both increase and decrease warfarin's efficacy (increase and decrease INR). Will monitor INR closely. No other DDIs identified.   Goal of Therapy:  INR 2-3 Monitor platelets by anticoagulation protocol: Yes   Plan:  Warfarin 5 mg x1 dose  Daily INR, CBC F/u s/sx bleeding  F/u new DDIs  Marget Hench, PharmD PGY1 Pharmacy Resident

## 2023-01-04 NOTE — Progress Notes (Signed)
Patient off floor to cath lab.  

## 2023-01-04 NOTE — Progress Notes (Signed)
 01/04/2023   I have seen and evaluated the patient for ILD flare.   S:  No events, lots of questions  Desats with movement on 4LPM.   O: Blood pressure 96/66, pulse 64, temperature 97.6 F (36.4 C), temperature source Oral, resp. rate 17, height 5' 3 (1.6 m), weight 81.6 kg, SpO2 94 %.  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

## 2023-01-04 NOTE — Progress Notes (Signed)
 NAME:  Gary [name], MRN:  [id number], DOB:  [birth year], LOS: 2 ADMISSION DATE:  01/01/2023, CONSULTATION DATE:  12/30 REFERRING MD:  Fairy, CHIEF COMPLAINT:  Wegners/ ILD Flare  History of Present Illness:  77/M with history of Wegener's granulomatosis, interstitial lung disease, former smoker, CAD/CABG, paroxysmal A-fib compliant on warfarin, diastolic CHF was hospitalized in 9/24 with presumed flare of ILD/Wegener's, treated with high-dose steroids, subsequently has been weaned down to prednisone  10 mg daily recently. Pt. Is also getting Rituxan  infusions for his Wegners. He states that the last time he flared, it was 3 weeks  before his next Rituxan  Infusion, as it is with this current flare. He feels this is a pattern.   Pt. Presented to the ED 12/28 with progressive dyspnea on exertion, cough, no fevers or chills, no edema orthopnea, reports worsening hypoxia at home x 1 week.  In the ED he was hypoxic initial O2 sats of 72%, placed on high flow, mildly tachypneic, labs noted creatinine 0.9, BNP 513, troponin 32, 75, 104, procalcitonin less than 0.1, WBC 13.8( suspect related to his chronic steroid use)  chest x-ray with chronic findings of interstitial lung disease, Covid and RSV negative -Admitted, started on IV steroids by [facility]  [facility] have asked for a pulmonary consult to ensure they are managing this flare as Dr. Geronimo would recommend.   Of note, He has history of Wegener's diagnosed 5 years ago.  He has been on Rituxan  for the past 5 years and chronic prednisone  at 5 mg [initially on 60 mg of prednisone ].  The Rituxan  dose was increased 1 year ago due to poor control of symptoms   He used to run a administrator, sports.  Quit smoking at the age of 58.  No significant exposures, travel history.  No family history of lung disease.  He was started on portable oxygen  through the CPAP and with exertion 6 months ago during hospitalization for pneumonia.  Pertinent  Medical  History   hypoxic respiratory failure Wegener's granulomatosis ILD HFrEF  CAF on warfarin Coronary artery disease Hyperlipidemia Type 2 diabetes Hypertension OSA PAD   Significant Hospital Events: Including procedures, antibiotic start and stop dates in addition to other pertinent events   12/29 admission for hypoxic respiratory Failure  Interim History / Subjective:  Status post right heart cath with preserved EF, minimal pulmonary hypertension Objective   Blood pressure (!) 150/57, pulse 67, temperature 97.8 F (36.6 C), temperature source Oral, resp. rate 19, height 5' 10 (1.778 m), weight 95.6 kg, SpO2 (!) 85%.        Intake/Output Summary (Last 24 hours) at 01/04/2023 1113 Last data filed at 01/04/2023 9372 Gross per 24 hour  Intake --  Output 1100 ml  Net -1100 ml   Filed Weights   01/01/23 1533 01/02/23 1853 01/04/23 0504  Weight: 101.2 kg 98.8 kg 95.6 kg    Examination: Awake alert reports not feeling well Heart sounds are regular Diminished breath sounds throughout Abdomen soft nontender Extremities with 1+ edema  Resolved Hospital Problem list     Assessment & Plan:  Acute Hypoxic Respiratory Failure in setting of acute exacerbation of Wegner's/ ILD Flare Mildly elevated troponin BNP of 518 on admission  Pt 3 weeks shy of next Rituxan  infusion, this is a pattern per the patient  Currently on Oxygen  at 2 L ( Down from 6 L initially) Plan Continue oxygen  as needed to maintain oxygen  saturation currently on nasal cannula 3 L/min with sats 83 to  92% Status post right heart cath earlier for mild pulmonary hypertension preserved ejection fraction Continue diuresis Continue Solu-Medrol  Continue rituxan  He reports feeling no better with current interventions          Best Practice (right click and Reselect all SmartList Selections daily)   Best Practice per Primary Team Labs   CBC: Recent Labs  Lab 01/01/23 1541 01/03/23 0245  01/04/23 0255  WBC 13.8* 17.3* 13.6*  NEUTROABS  --  16.0*  --   HGB 10.6* 9.3* 9.4*  HCT 35.9* 30.9* 31.8*  MCV 81.8 80.1 80.5  PLT 455* 432* 483*    Basic Metabolic Panel: Recent Labs  Lab 01/01/23 1541 01/02/23 2104 01/03/23 0245 01/04/23 0255  NA 142  --  140 139  K 4.3  --  4.2 3.6  CL 106  --  106 102  CO2 25  --  25 29  GLUCOSE 125*  --  147* 202*  BUN 20  --  22 33*  CREATININE 0.91  --  0.89 0.99  CALCIUM  10.0  --  9.0 9.0  MG  --  2.0 2.0 2.3  PHOS  --   --  3.2  --    GFR: Estimated Creatinine Clearance: 70.2 mL/min (by C-G formula based on SCr of 0.99 mg/dL). Recent Labs  Lab 01/01/23 1541 01/01/23 2156 01/03/23 0245 01/04/23 0255  PROCALCITON  --  <0.10 <0.10  --   WBC 13.8*  --  17.3* 13.6*    Liver Function Tests: Recent Labs  Lab 01/03/23 0245  AST 22  ALT 16  ALKPHOS 85  BILITOT 0.7  PROT 5.7*  ALBUMIN  2.8*   No results for input(s): LIPASE, AMYLASE in the last 168 hours. No results for input(s): AMMONIA in the last 168 hours.  ABG    Component Value Date/Time   HCO3 26.5 01/03/2023 0245   TCO2 27 09/17/2022 1512   O2SAT 98.5 01/03/2023 0245     Coagulation Profile: Recent Labs  Lab 01/01/23 1541 01/03/23 0245 01/04/23 0255  INR 2.4* 2.3* 1.8*    Cardiac Enzymes: No results for input(s): CKTOTAL, CKMB, CKMBINDEX, TROPONINI in the last 168 hours.  HbA1C: Hgb A1c MFr Bld  Date/Time Value Ref Range Status  01/02/2023 09:04 PM 5.9 (H) 4.8 - 5.6 % Final    Comment:    (NOTE)         Prediabetes: 5.7 - 6.4         Diabetes: >6.4         Glycemic control for adults with diabetes: <7.0   09/18/2022 07:06 AM 6.5 (H) 4.8 - 5.6 % Final    Comment:    (NOTE)         Prediabetes: 5.7 - 6.4         Diabetes: >6.4         Glycemic control for adults with diabetes: <7.0     CBG: Recent Labs  Lab 01/03/23 0649 01/03/23 1137 01/03/23 1639 01/03/23 2101 01/04/23 0620  GLUCAP 142* 128* 154* 147* 159*      Steve Reign Bartnick ACNP Acute Care Nurse Practitioner [facility] Please consult Amion 01/04/2023, 11:13 AM

## 2023-01-04 NOTE — Interval H&P Note (Signed)
 History and Physical Interval Note:  01/04/2023 8:39 AM  Gary [name]  has presented today for surgery, with the diagnosis of interstitial lung disease.  The various methods of treatment have been discussed with the patient and family. After consideration of risks, benefits and other options for treatment, the patient has consented to  Procedure(s): RIGHT HEART CATH (N/A) as a surgical intervention.  The patient's history has been reviewed, patient examined, no change in status, stable for surgery.  I have reviewed the patient's chart and labs.  Questions were answered to the patient's satisfaction.     Mikelle Myrick [employer]

## 2023-01-04 NOTE — TOC Initial Note (Signed)
 Transition of Care [facility]) - Initial/Assessment Note    Patient Details  Name: Gary [name] MRN: [id number] Date of Birth: [birth year]  Transition of Care [facility]) CM/SW Contact:    Waddell Barnie Rama, RN Phone Number: 01/04/2023, 3:20 PM  Clinical Narrative:                 From home with spouse, has PCP and insurance on file, states has no HH services in place at this time, has home oxygen  2-4 liters with [employer].   States wife  will transport him home at [employer] and she is support system, states [facility] [address].  Pta self ambulatory. He is also active with [facility].   Expected Discharge Plan: Home w Home Health Services Barriers to Discharge: Continued Medical Work up   Patient Goals and CMS Choice Patient states their goals for this hospitalization and ongoing recovery are:: return home   Choice offered to / list presented to : NA      Expected Discharge Plan and Services In-house Referral: NA Discharge Planning Services: CM Consult Post Acute Care Choice: NA Living arrangements for the past 2 months: Single Family Home                 DME Arranged: N/A DME Agency: NA       HH Arranged: NA          Prior Living Arrangements/Services Living arrangements for the past 2 months: Single Family Home Lives with:: Spouse Patient language and need for interpreter reviewed:: Yes Do you feel safe going back to the place where you live?: Yes      Need for Family Participation in Patient Care: Yes (Comment) Care giver support system in place?: Yes (comment) Current home services: DME (home oxygen  with [employer] 2-4 liters,) Criminal Activity/Legal Involvement Pertinent to Current Situation/Hospitalization: No - Comment as needed  Activities of Daily Living   ADL Screening (condition at time of admission) Independently performs ADLs?: Yes (appropriate for developmental age) Is the patient deaf or have difficulty hearing?: No Does the  patient have difficulty seeing, even when wearing glasses/contacts?: No Does the patient have difficulty concentrating, remembering, or making decisions?: No  Permission Sought/Granted Permission sought to share information with : Case Manager Permission granted to share information with : Yes, Verbal Permission Granted              Emotional Assessment Appearance:: Appears stated age Attitude/Demeanor/Rapport: Engaged Affect (typically observed): Appropriate Orientation: : Oriented to Self, Oriented to Place, Oriented to  Time, Oriented to Situation Alcohol  / Substance Use: Not Applicable Psych Involvement: No (comment)  Admission diagnosis:  Hypoxia [R09.02] Elevated troponin [R79.89] Acute respiratory failure with hypoxia (HCC) [J96.01] Patient Active Problem List   Diagnosis Date Noted   Elevated troponin 01/02/2023   SIRS (systemic inflammatory response syndrome) (HCC) 01/02/2023   Leukocytosis 01/02/2023   Chronic iron deficiency anemia 01/02/2023   Allergic rhinitis 01/02/2023   Acquired hypothyroidism 01/02/2023   Depression 01/02/2023   GAD (generalized anxiety disorder) 01/02/2023   Acute hypoxemic respiratory failure (HCC) 09/17/2022   Interstitial lung disease (HCC) 09/17/2022   Encounter for monitoring dofetilide  therapy 06/17/2022   Hypercoagulable state due to persistent atrial fibrillation (HCC) 05/11/2022   Persistent atrial fibrillation (HCC) 05/11/2022   CAP (community acquired pneumonia) 03/02/2022   Class 1 obesity 03/02/2022   Hypokalemia 03/02/2022   Acute respiratory failure with hypoxia (HCC) 03/01/2022   Atypical atrial flutter (HCC) 02/26/2022  Non-ST elevation (NSTEMI) myocardial infarction [facility])    Chest pain 09/25/2021   Chest pain due to coronary artery disease (HCC) 09/25/2021   Hypernatremia 03/10/2021   Long term (current) use of anticoagulants 02/12/2019   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) 01/24/2019   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) 11/18/2017   Obstructive sleep apnea (adult) (pediatric) 11/09/2017   DM2 (diabetes mellitus, type 2) (HCC) 09/29/2017   Acute respiratory failure (HCC) 05/03/2017   Pulmonary edema cardiac cause (HCC) 05/03/2017   Respiratory failure (HCC) 05/03/2017   Chronic diastolic CHF (congestive heart failure) (HCC) 02/20/2016   Aortic aneurysm (HCC) 02/20/2016   CAD (coronary artery disease) 11/13/2014   Essential hypertension 11/13/2014   Hyperlipidemia 11/13/2014   Paroxysmal atrial fibrillation (HCC) 11/13/2014   Osteoarthritis of right hip 01/03/2012   PCP:  Valentin Skates, DO Pharmacy:   [phone] [place], [place] - [address] [place] [address] Phone: [phone] Fax: [phone]  [facility] [address] [place] [address] Phone: [phone] Fax: [phone]     Social Drivers of Health (SDOH) Social History: SDOH Screenings   Food Insecurity: No Food Insecurity (01/02/2023)  Housing: Low Risk  (01/02/2023)  Transportation Needs: No Transportation Needs (01/02/2023)  Utilities: Not At Risk (01/02/2023)  Social Connections: Unknown (01/04/2023)  Tobacco Use: Medium Risk (01/02/2023)   SDOH Interventions:     Readmission Risk Interventions    03/02/2022    4:16 PM  Readmission Risk Prevention Plan  Transportation Screening Complete  HRI or Home Care Consult Complete  Palliative Care Screening Not Applicable  Medication Review (RN Care Manager) Complete

## 2023-01-04 NOTE — Progress Notes (Signed)
 PROGRESS NOTE    Gary [name]  FMW:[id number] DOB: 1945/11/08 DOA: 01/01/2023 PCP: Valentin Skates, DO  77/M with history of Wegener's granulomatosis, interstitial lung disease, former smoker, CAD/CABG, paroxysmal A-fib on warfarin, diastolic CHF was hospitalized in 9/24 with presumed flare of ILD/Wegener's, treated with high-dose steroids, subsequently has been weaned down to prednisone  10 mg daily recently.  Presented to the ED 12/28 with progressive dyspnea on exertion, cough, no fevers or chills, no edema orthopnea, reports worsening hypoxia at home. In the ED he was hypoxic initial O2 sats of 72%, placed on high flow, mildly tachypneic, labs noted creatinine 0.9, BNP 513, troponin 32, 75, 104, procalcitonin less than 0.1, WBC 13.8 chest x-ray with chronic findings of interstitial lung disease -Admitted, started on IV steroids -12/30: Pulmonary consulted, recommended RHC -12/31 right heart cath  Subjective: -Just back from right heart cath  Assessment and Plan:  Acute hypoxic respiratory failure ILD flare suspected -On prednisone  taper following recent exacerbation, followed by pulmonary and rheumatology -Flu/COVID/RSV PCR negative, do not see signs of infection or volume overload at this time -Continue IV Solu-Medrol  today, Xopenex  nebs, incentive spirometry -Pulmonary consult appreciated, right heart cath done today, results pending  Leukocytosis Likely reactive, worse on steroids, afebrile, PCT< 0.1  Chronic diastolic CHF Pulm Hypertension -Recent echo 12/24 with EF 65%, grade 1 DD, normal RV, Pulm HTN RVSP 59 -Clinically does not appear volume overloaded now, await results of RHC -Continue Lasix  daily  Type 2 diabetes mellitus -Metformin  and Ozempic on hold -CBG stable so far, if starts trending up on steroids will need long-acting insulin   Chronic deficiency anemia -Relatively stable,  Paroxysmal atrial fibrillation -Continue Coreg , Tikosyn , warfarin per  pharmacy -Replace K  Hypothyroidism Continue Synthroid   Depression  Generalized anxiety continue Effexor   DVT prophylaxis: Warfarin Code Status: Full code Family Communication: None present Disposition Plan: Home pending improvement in hypoxia  Consultants:    Procedures:   Antimicrobials:    Objective: Vitals:   01/04/23 0930 01/04/23 0945 01/04/23 1000 01/04/23 1100  BP: (!) 131/56 (!) 141/73 138/68 130/60  Pulse: 63 65 64 (!) 101  Resp: (!) 23 17 17    Temp: 97.8 F (36.6 C)     TempSrc: Oral     SpO2: 100% 100% 99% 90%  Weight:      Height:        Intake/Output Summary (Last 24 hours) at 01/04/2023 1213 Last data filed at 01/04/2023 9372 Gross per 24 hour  Intake --  Output 1100 ml  Net -1100 ml   Filed Weights   01/01/23 1533 01/02/23 1853 01/04/23 0504  Weight: 101.2 kg 98.8 kg 95.6 kg    Examination:  General exam: Elderly chronically ill male sitting up in bed, AAOx3 HEENT: No JVD CVS: S1-S2, regular rhythm Lungs: Few basilar Rales Abdomen: Soft, nontender, bowel sounds present Extremities: No edema  Skin: No rashes Psychiatry:  Mood & affect appropriate.     Data Reviewed:   CBC: Recent Labs  Lab 01/01/23 1541 01/03/23 0245 01/04/23 0255  WBC 13.8* 17.3* 13.6*  NEUTROABS  --  16.0*  --   HGB 10.6* 9.3* 9.4*  HCT 35.9* 30.9* 31.8*  MCV 81.8 80.1 80.5  PLT 455* 432* 483*   Basic Metabolic Panel: Recent Labs  Lab 01/01/23 1541 01/02/23 2104 01/03/23 0245 01/04/23 0255  NA 142  --  140 139  K 4.3  --  4.2 3.6  CL 106  --  106 102  CO2 25  --  25 29  GLUCOSE 125*  --  147* 202*  BUN 20  --  22 33*  CREATININE 0.91  --  0.89 0.99  CALCIUM  10.0  --  9.0 9.0  MG  --  2.0 2.0 2.3  PHOS  --   --  3.2  --    GFR: Estimated Creatinine Clearance: 70.2 mL/min (by C-G formula based on SCr of 0.99 mg/dL). Liver Function Tests: Recent Labs  Lab 01/03/23 0245  AST 22  ALT 16  ALKPHOS 85  BILITOT 0.7  PROT 5.7*  ALBUMIN   2.8*   No results for input(s): LIPASE, AMYLASE in the last 168 hours. No results for input(s): AMMONIA in the last 168 hours. Coagulation Profile: Recent Labs  Lab 01/01/23 1541 01/03/23 0245 01/04/23 0255  INR 2.4* 2.3* 1.8*   Cardiac Enzymes: No results for input(s): CKTOTAL, CKMB, CKMBINDEX, TROPONINI in the last 168 hours. BNP (last 3 results) Recent Labs    11/04/22 1519  PROBNP 416.0*   HbA1C: Recent Labs    01/02/23 2104  HGBA1C 5.9*   CBG: Recent Labs  Lab 01/03/23 1137 01/03/23 1639 01/03/23 2101 01/04/23 0620 01/04/23 1133  GLUCAP 128* 154* 147* 159* 133*   Lipid Profile: No results for input(s): CHOL, HDL, LDLCALC, TRIG, CHOLHDL, LDLDIRECT in the last 72 hours. Thyroid  Function Tests: No results for input(s): TSH, T4TOTAL, FREET4, T3FREE, THYROIDAB in the last 72 hours. Anemia Panel: No results for input(s): VITAMINB12, FOLATE, FERRITIN, TIBC, IRON, RETICCTPCT in the last 72 hours. Urine analysis:    Component Value Date/Time   COLORURINE YELLOW 09/17/2022 2236   APPEARANCEUR HAZY (A) 09/17/2022 2236   LABSPEC 1.010 09/17/2022 2236   PHURINE 6.5 09/17/2022 2236   GLUCOSEU NEGATIVE 09/17/2022 2236   HGBUR NEGATIVE 09/17/2022 2236   BILIRUBINUR NEGATIVE 09/17/2022 2236   KETONESUR NEGATIVE 09/17/2022 2236   PROTEINUR NEGATIVE 09/17/2022 2236   UROBILINOGEN 0.2 12/24/2011 0805   NITRITE NEGATIVE 09/17/2022 2236   LEUKOCYTESUR SMALL (A) 09/17/2022 2236   Sepsis Labs: @LABRCNTIP (procalcitonin:4,lacticidven:4)  ) Recent Results (from the past 240 hours)  Resp panel by RT-PCR (RSV, Flu A&B, Covid) Anterior Nasal Swab     Status: None   Collection Time: 01/01/23  3:41 PM   Specimen: Anterior Nasal Swab  Result Value Ref Range Status   SARS Coronavirus 2 by RT PCR NEGATIVE NEGATIVE Final    Comment: (NOTE) SARS-CoV-2 target nucleic acids are NOT DETECTED.  The SARS-CoV-2 RNA is generally  detectable in upper respiratory specimens during the acute phase of infection. The lowest concentration of SARS-CoV-2 viral copies this assay can detect is 138 copies/mL. A negative result does not preclude SARS-Cov-2 infection and should not be used as the sole basis for treatment or other patient management decisions. A negative result may occur with  improper specimen collection/handling, submission of specimen other than nasopharyngeal swab, presence of viral mutation(s) within the areas targeted by this assay, and inadequate number of viral copies(<138 copies/mL). A negative result must be combined with clinical observations, patient history, and epidemiological information. The expected result is Negative.  Fact Sheet for Patients:  bloggercourse.com  Fact Sheet for Healthcare Providers:  seriousbroker.it  This test is no t yet approved or cleared by the [place]  FDA and  has been authorized for detection and/or diagnosis of SARS-CoV-2 by FDA under an Emergency Use Authorization (EUA). This EUA will remain  in effect (meaning this test can be used) for the duration of the COVID-19 declaration under Section 564(b)(1) of  the Act, 21 U.S.C.section 360bbb-3(b)(1), unless the authorization is terminated  or revoked sooner.       Influenza A by PCR NEGATIVE NEGATIVE Final   Influenza B by PCR NEGATIVE NEGATIVE Final    Comment: (NOTE) The Xpert Xpress SARS-CoV-2/FLU/RSV plus assay is intended as an aid in the diagnosis of influenza from Nasopharyngeal swab specimens and should not be used as a sole basis for treatment. Nasal washings and aspirates are unacceptable for Xpert Xpress SARS-CoV-2/FLU/RSV testing.  Fact Sheet for Patients: bloggercourse.com  Fact Sheet for Healthcare Providers: seriousbroker.it  This test is not yet approved or cleared by the [place]  FDA  and has been authorized for detection and/or diagnosis of SARS-CoV-2 by FDA under an Emergency Use Authorization (EUA). This EUA will remain in effect (meaning this test can be used) for the duration of the COVID-19 declaration under Section 564(b)(1) of the Act, 21 U.S.C. section 360bbb-3(b)(1), unless the authorization is terminated or revoked.     Resp Syncytial Virus by PCR NEGATIVE NEGATIVE Final    Comment: (NOTE) Fact Sheet for Patients: bloggercourse.com  Fact Sheet for Healthcare Providers: seriousbroker.it  This test is not yet approved or cleared by the [place]  FDA and has been authorized for detection and/or diagnosis of SARS-CoV-2 by FDA under an Emergency Use Authorization (EUA). This EUA will remain in effect (meaning this test can be used) for the duration of the COVID-19 declaration under Section 564(b)(1) of the Act, 21 U.S.C. section 360bbb-3(b)(1), unless the authorization is terminated or revoked.  Performed at [facility], [address], [place], [place] [address]      Radiology Studies: CARDIAC CATHETERIZATION Result Date: 01/04/2023 1. Normal filling pressures. 2. Mild pulmonary hypertension without elevated PVR. 3. Normal cardiac output.     Scheduled Meds:  aspirin   81 mg Oral Daily   carvedilol   12.5 mg Oral BID   dofetilide   500 mcg Oral BID   feeding supplement  237 mL Oral BID BM   ferrous sulfate   325 mg Oral Daily   [START ON 01/05/2023] furosemide   40 mg Oral Daily   insulin  aspart  0-15 Units Subcutaneous TID WC   insulin  aspart  0-5 Units Subcutaneous QHS   levothyroxine   137 mcg Oral QAC breakfast   loratadine   10 mg Oral Daily   methylPREDNISolone  (SOLU-MEDROL ) injection  80 mg Intravenous Q12H   pantoprazole   40 mg Oral Daily   pneumococcal 20-valent conjugate vaccine  0.5 mL Intramuscular Tomorrow-1000   venlafaxine  XR  150 mg Oral Q breakfast    warfarin  5 mg Oral ONCE-1600   Warfarin - Pharmacist Dosing Inpatient   Does not apply q1600   Continuous Infusions:   LOS: 2 days    Time spent:    Sigurd Pac, MD [facility]   01/04/2023, 12:13 PM

## 2023-01-05 ENCOUNTER — Inpatient Hospital Stay (HOSPITAL_COMMUNITY): Payer: Medicare Other

## 2023-01-05 ENCOUNTER — Telehealth: Payer: Self-pay | Admitting: Internal Medicine

## 2023-01-05 DIAGNOSIS — R609 Edema, unspecified: Secondary | ICD-10-CM | POA: Diagnosis not present

## 2023-01-05 DIAGNOSIS — J9601 Acute respiratory failure with hypoxia: Secondary | ICD-10-CM | POA: Diagnosis not present

## 2023-01-05 LAB — CBC
HCT: 32.3 % — ABNORMAL LOW (ref 39.0–52.0)
Hemoglobin: 9.6 g/dL — ABNORMAL LOW (ref 13.0–17.0)
MCH: 23.8 pg — ABNORMAL LOW (ref 26.0–34.0)
MCHC: 29.7 g/dL — ABNORMAL LOW (ref 30.0–36.0)
MCV: 80.1 fL (ref 80.0–100.0)
Platelets: 512 10*3/uL — ABNORMAL HIGH (ref 150–400)
RBC: 4.03 MIL/uL — ABNORMAL LOW (ref 4.22–5.81)
RDW: 17.9 % — ABNORMAL HIGH (ref 11.5–15.5)
WBC: 15.2 10*3/uL — ABNORMAL HIGH (ref 4.0–10.5)
nRBC: 0 % (ref 0.0–0.2)

## 2023-01-05 LAB — GLUCOSE, CAPILLARY
Glucose-Capillary: 174 mg/dL — ABNORMAL HIGH (ref 70–99)
Glucose-Capillary: 190 mg/dL — ABNORMAL HIGH (ref 70–99)
Glucose-Capillary: 148 mg/dL — ABNORMAL HIGH (ref 70–99)
Glucose-Capillary: 123 mg/dL — ABNORMAL HIGH (ref 70–99)

## 2023-01-05 LAB — BASIC METABOLIC PANEL
Anion gap: 10 (ref 5–15)
BUN: 40 mg/dL — ABNORMAL HIGH (ref 8–23)
CO2: 27 mmol/L (ref 22–32)
Calcium: 9.2 mg/dL (ref 8.9–10.3)
Chloride: 104 mmol/L (ref 98–111)
Creatinine, Ser: 0.87 mg/dL (ref 0.61–1.24)
GFR, Estimated: >60 mL/min (ref >60)
Glucose, Bld: 194 mg/dL — ABNORMAL HIGH (ref 70–99)
Potassium: 4.5 mmol/L (ref 3.5–5.1)
Sodium: 141 mmol/L (ref 135–145)

## 2023-01-05 LAB — PROTIME-INR
INR: 1.8 — ABNORMAL HIGH (ref 0.8–1.2)
Prothrombin Time: 20.8 s — ABNORMAL HIGH (ref 11.4–15.2)

## 2023-01-05 LAB — MAGNESIUM: Magnesium: 2.4 mg/dL (ref 1.7–2.4)

## 2023-01-05 LAB — SEDIMENTATION RATE: Sed Rate: 41 mm/h — ABNORMAL HIGH (ref 0–16)

## 2023-01-05 MED ORDER — PREDNISONE 20 MG PO TABS
60.0000 mg | ORAL_TABLET | Freq: Every day | ORAL | Status: DC
  Administered 2023-01-06 – 2023-01-08 (×3): 60 mg via ORAL
  Filled 2023-01-05 (×3): qty 3

## 2023-01-05 MED ORDER — INSULIN GLARGINE-YFGN 100 UNIT/ML ~~LOC~~ SOLN
10.0000 [IU] | Freq: Every day | SUBCUTANEOUS | Status: DC
  Administered 2023-01-05 – 2023-01-08 (×4): 10 [IU] via SUBCUTANEOUS
  Filled 2023-01-05 (×4): qty 0.1

## 2023-01-05 MED ORDER — WARFARIN SODIUM 2.5 MG PO TABS
2.5000 mg | ORAL_TABLET | Freq: Once | ORAL | Status: AC
  Administered 2023-01-05: 2.5 mg via ORAL
  Filled 2023-01-05: qty 1

## 2023-01-05 NOTE — Telephone Encounter (Signed)
 Rx team   Glean ONEIDA Lighter what is happening with esbriet  start?  Thanks    SIGNATURE    Dr. Dorethia Cave, M.D., F.C.C.P,  Pulmonary and Critical Care Medicine Staff Physician, [facility] Center Director - Interstitial Lung Disease  Program  Pulmonary Fibrosis [facility] at [address] [facility] [place], [place], [address]   Pager: [phone], If no answer  -> Check AMION or Try [phone] Telephone (clinical office): [phone] Telephone (research): [phone]  12:59 PM 01/05/2023

## 2023-01-05 NOTE — Plan of Care (Signed)
 Pt coping well with diagnosis and is conversing well and accepting of diagnosis

## 2023-01-05 NOTE — Evaluation (Signed)
 Physical Therapy Evaluation Patient Details Name: Gary [name] MRN: [id number] DOB: [birth year] Today's Date: 01/05/2023  History of Present Illness  Pt is 78 yo male admitted on 01/01/23 with resp failure due to ILD flair.  Pt with hx including but not limited to Wegener's granulomatosis, ILD, former smoker, CAD/CABG, afib, CHF, DM2  Clinical Impression  Pt admitted with above diagnosis. At baseline, pt independent from home with family.  He uses O2 at night and for community ambulation.  Today, pt pleasant and motivated.  Noting pt has doppler ordered (ordered yesterday evening, therapy ordered 3x today) but has been mobilizing at EOB/standing, no LE edema, no calf pain - proceeded with eval but held LE MMT and exercises.  Pt appears to have overall good strength and balance with main limitation being cardiopulmonary endurance.  He ambulated to sink with sats significantly dropping and requiring increased O2 to recover (see gait for details).  Sats dropped as low as 77% with activity.  Pt reports mildly SOB but not bad.  Increased time for session for rest breaks and O2 monitoring. Pt currently with functional limitations due to the deficits listed below (see PT Problem List). Pt will benefit from acute skilled PT to increase their independence and safety with mobility to allow discharge.  Pt reports prefers not to use AD but did educate on benefits of rollator to allow for rest breaks.  Recommending HHPT at d/c but potential to progress to no needs.           If plan is discharge home, recommend the following: A little help with walking and/or transfers;A little help with bathing/dressing/bathroom;Assistance with cooking/housework;Help with stairs or ramp for entrance   Can travel by private vehicle        Equipment Recommendations None recommended by PT  Recommendations for Other Services       Functional Status Assessment Patient has had a recent decline in their functional status and  demonstrates the ability to make significant improvements in function in a reasonable and predictable amount of time.     Precautions / Restrictions Precautions Precautions: Other (comment) Precaution Comments: Watch Sats (orders for 88-92%)      Mobility  Bed Mobility Overal bed mobility: Needs Assistance Bed Mobility: Supine to Sit, Sit to Supine     Supine to sit: Modified independent (Device/Increase time) Sit to supine: Modified independent (Device/Increase time)        Transfers Overall transfer level: Needs assistance Equipment used: None Transfers: Sit to/from Stand Sit to Stand: Supervision           General transfer comment: STS x 3 during session    Ambulation/Gait Ambulation/Gait assistance: Supervision Gait Distance (Feet): 5 Feet (5'x2) Assistive device: None Gait Pattern/deviations: WFL(Within Functional Limits) Gait velocity: decreased     General Gait Details: Pt was on 3 L O2 with sats 92%.  Pt ambulated to sink and sat in chair with sats down to 80%.  Increased to 6 L and required 1.5 mins to fully recover sats and drop back to 3 L.   Placed on 4 L for next bout, Stood to brush teeth (1.5 mins standing) returned to sitting and sats down to 77%.  Again up to 6 L and 1.5 mins to fully recover.  Left on 6 L to ambulate back to bed. Sats maintained 90% on 6 L.  Able to decrease back to 3L with sats still 90%.  His balance and gait pattern are [facility] - just limited by endurance,  SOB, decreased O2 sats.  Pt requiring 5 min rest breaks between each bout.  Pt reports not feeling too short of breath.  Stairs            Wheelchair Mobility     Tilt Bed    Modified Rankin (Stroke Patients Only)       Balance Overall balance assessment: Needs assistance Sitting-balance support: No upper extremity supported Sitting balance-Leahy Scale: Normal     Standing balance support: No upper extremity supported Standing balance-Leahy Scale: Good                                Pertinent Vitals/Pain Pain Assessment Pain Assessment: No/denies pain    Home Living Family/patient expects to be discharged to:: Private residence Living Arrangements: Spouse/significant other Available Help at Discharge: Family;Available 24 hours/day Type of Home: House Home Access: Stairs to enter Entrance Stairs-Rails: Right Entrance Stairs-Number of Steps: 2   Home Layout: Two level;Able to live on main level with bedroom/bathroom Home Equipment: Grab bars - tub/shower;Shower Counsellor (2 wheels);Rollator (4 wheels) Additional Comments: On 2 L O2 at night and has for activity if needed. Does have a pulse oximeter    Prior Function Prior Level of Function : Independent/Modified Independent;Driving             Mobility Comments: Walks in home without AD.  Ambulates in home without O2 but needs rest breaks.  Does ambulate in community but needs his O2. ADLs Comments: Reports independent with adls and iadls     Extremity/Trunk Assessment   Upper Extremity Assessment Upper Extremity Assessment: Overall WFL for tasks assessed    Lower Extremity Assessment Lower Extremity Assessment: Overall WFL for tasks assessed (no edema noted, no pain (no calf pain), ROM WFL, strength at least 3/5 but did not further test as pt has doppler pending)    Cervical / Trunk Assessment Cervical / Trunk Assessment: Kyphotic  Communication      Cognition Arousal: Alert Behavior During Therapy: WFL for tasks assessed/performed Overall Cognitive Status: Within Functional Limits for tasks assessed                                 General Comments: pleasant , motivated, talkative and sometimes needs cueing to focus on breathing        General Comments      Exercises     Assessment/Plan    PT Assessment Patient needs continued PT services  PT Problem List Cardiopulmonary status limiting activity;Decreased activity  tolerance;Decreased knowledge of use of DME;Decreased balance;Decreased mobility       PT Treatment Interventions DME instruction;Therapeutic exercise;Gait training;Balance training;Stair training;Functional mobility training;Therapeutic activities;Patient/family education    PT Goals (Current goals can be found in the Care Plan section)  Acute Rehab PT Goals Patient Stated Goal: ILD back into remission, able to return home this weekend PT Goal Formulation: With patient/family Time For Goal Achievement: 01/19/23 Potential to Achieve Goals: Good    Frequency Min 1X/week     Co-evaluation               AM-PAC PT 6 Clicks Mobility  Outcome Measure Help needed turning from your back to your side while in a flat bed without using bedrails?: None Help needed moving from lying on your back to sitting on the side of a flat bed without using bedrails?: None Help  needed moving to and from a bed to a chair (including a wheelchair)?: A Little Help needed standing up from a chair using your arms (e.g., wheelchair or bedside chair)?: A Little Help needed to walk in hospital room?: A Little Help needed climbing 3-5 steps with a railing? : A Little 6 Click Score: 20    End of Session   Activity Tolerance: Other (comment) (limited by O2 sats/SOB) Patient left: in bed;with call bell/phone within reach (transport staff present to take for doppler) Nurse Communication: Mobility status PT Visit Diagnosis: Other abnormalities of gait and mobility (R26.89)    Time: 8463-8392 PT Time Calculation (min) (ACUTE ONLY): 31 min   Charges:   PT Evaluation $PT Eval Moderate Complexity: 1 Mod PT Treatments $Therapeutic Activity: 8-22 mins PT General Charges $$ ACUTE PT VISIT: 1 Visit         Gary, PT Acute Rehab Services [facility] [phone]   Gary [name] 01/05/2023, 4:30 PM

## 2023-01-05 NOTE — Progress Notes (Signed)
 PHARMACY - ANTICOAGULATION CONSULT NOTE  Pharmacy Consult for warfarin Indication: atrial fibrillation  Allergies  Allergen Reactions   Farxiga  [Dapagliflozin ] Other (See Comments)    Lightheadedness Low blood pressure   Statins Other (See Comments)    Myalgias     Patient Measurements: Height: 5' 10 (177.8 cm) Weight: 94.1 kg (207 lb 7.3 oz) IBW/kg (Calculated) : 73 Heparin  Dosing Weight: n/a  Vital Signs: Temp: 98.2 F (36.8 C) (01/01 0434) Temp Source: Oral (01/01 0434) BP: 133/65 (01/01 0434) Pulse Rate: 70 (01/01 0434)  Labs: Recent Labs    01/03/23 0245 01/03/23 0802 01/04/23 0255 01/04/23 0855 01/05/23 0303  HGB 9.3*  --  9.4* 10.2*  9.9* 9.6*  HCT 30.9*  --  31.8* 30.0*  29.0* 32.3*  PLT 432*  --  483*  --  512*  LABPROT 25.1*  --  20.8*  --  20.8*  INR 2.3*  --  1.8*  --  1.8*  CREATININE 0.89  --  0.99  --  0.87  TROPONINIHS  --  31*  --   --   --     Estimated Creatinine Clearance: 79.3 mL/min (by C-G formula based on SCr of 0.87 mg/dL).   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 Pharmacy consulted to manage/dose warfarin. On 12/29, patient was scheduled to receive warfarin 5 mg PO x 1 dose. It is unclear if the patient received that dose, as there is no documentation on the [facility] but the patient did tell the RN that he received the 5 mg dose on 12/29 at the [facility] ED prior to transferring to  [facility]. In light of this information, will dose according to PTA regimen so as not to overshoot therapeutic range.  Last dose prior to admission: 12/28 @ 0800  PTA regimen: 2.5mg  on MWF, 5mg  on all other days  INR this AM slightly subtherapeutic at 1.8 again. CBC ok - Hgb 9.6, PLTc 512. Nutrition supp added 12/30, poor meal intake. Will continue home dosing schedule.  Of note, patient is now on high-dose methylprednisolone  which has been documented to both increase and decrease warfarin's efficacy (increase and decrease INR). Will monitor INR closely. No other DDIs identified.   Goal of Therapy:  INR 2-3 Monitor platelets by anticoagulation protocol: Yes   Plan:  Warfarin 2.5 mg x1 dose  Daily INR, CBC F/u s/sx bleeding  F/u new DDIs  Marget Hench, PharmD PGY1 Pharmacy Resident

## 2023-01-05 NOTE — Progress Notes (Signed)
 PROGRESS NOTE    Gary [name]  FMW:[id number] DOB: Oct 30, 1945 DOA: 01/01/2023 PCP: Valentin Skates, DO  77/M with history of Wegener's granulomatosis, interstitial lung disease, former smoker, CAD/CABG, paroxysmal A-fib on warfarin, diastolic CHF was hospitalized in 9/24 with presumed flare of ILD/Wegener's, treated with high-dose steroids, subsequently has been weaned down to prednisone  10 mg daily recently.  Presented to the ED 12/28 with progressive dyspnea on exertion, cough, no fevers or chills, no edema orthopnea, reports worsening hypoxia at home. In the ED he was hypoxic initial O2 sats of 72%, placed on high flow, mildly tachypneic, labs noted creatinine 0.9, BNP 513, troponin 32, 75, 104, procalcitonin less than 0.1, WBC 13.8 chest x-ray with chronic findings of interstitial lung disease -Admitted, started on IV steroids -12/30: Pulmonary consulted, recommended RHC -12/31 right heart cath  Subjective: -Just back from right heart cath  Assessment and Plan:  Acute hypoxic respiratory failure ILD flare  -On prednisone  taper following recent exacerbation, followed by pulmonary and rheumatology -Flu/COVID/RSV PCR negative -Continue IV Solu-Medrol  today, changed to prednisone  tomorrow, Xopenex  nebs, incentive spirometry -Pulmonary consult appreciated, RHC 12/31 with normal filling pressures, mild pulmonary hypertension, preserved cardiac output  Leukocytosis Likely reactive, worse on steroids, afebrile, PCT< 0.1  Chronic diastolic CHF Pulm Hypertension -Recent echo 12/24 with EF 65%, grade 1 DD, normal RV, Pulm HTN RVSP 59 -RHC 12/31 with normal filling pressures, mild PAH -Euvolemic, continue Lasix  daily  Type 2 diabetes mellitus -Metformin  and Ozempic on hold -CBGs trending up on steroids, add Semglee   Chronic deficiency anemia -Relatively stable,  Paroxysmal atrial fibrillation -Continue Coreg , Tikosyn , warfarin per pharmacy -Replaced K  Hypothyroidism Continue  Synthroid   Depression  Generalized anxiety continue Effexor   DVT prophylaxis: Warfarin Code Status: Full code Family Communication: None present Disposition Plan: Home pending improvement in hypoxia  Consultants:    Procedures:   Antimicrobials:    Objective: Vitals:   01/05/23 0147 01/05/23 0434 01/05/23 0602 01/05/23 0815  BP: 138/66 133/65  116/65  Pulse: 65 70  71  Resp: (!) 23 20  19   Temp: 98 F (36.7 C) 98.2 F (36.8 C)  98.2 F (36.8 C)  TempSrc: Oral Oral  Oral  SpO2: 97% 91%  95%  Weight:   94.1 kg   Height:        Intake/Output Summary (Last 24 hours) at 01/05/2023 1050 Last data filed at 01/05/2023 0900 Gross per 24 hour  Intake 1140 ml  Output 1250 ml  Net -110 ml   Filed Weights   01/02/23 1853 01/04/23 0504 01/05/23 0602  Weight: 98.8 kg 95.6 kg 94.1 kg    Examination:  General exam: Elderly chronically ill male sitting up in bed, AAOx3 HEENT: No JVD CVS: S1-S2, regular rhythm Lungs: Few basilar Rales Abdomen: Soft, nontender, bowel sounds present Extremities: No edema  Skin: No rashes Psychiatry:  Mood & affect appropriate.     Data Reviewed:   CBC: Recent Labs  Lab 01/01/23 1541 01/03/23 0245 01/04/23 0255 01/04/23 0855 01/05/23 0303  WBC 13.8* 17.3* 13.6*  --  15.2*  NEUTROABS  --  16.0*  --   --   --   HGB 10.6* 9.3* 9.4* 10.2*  9.9* 9.6*  HCT 35.9* 30.9* 31.8* 30.0*  29.0* 32.3*  MCV 81.8 80.1 80.5  --  80.1  PLT 455* 432* 483*  --  512*   Basic Metabolic Panel: Recent Labs  Lab 01/01/23 1541 01/02/23 2104 01/03/23 0245 01/04/23 0255 01/04/23 0855 01/05/23 0303  NA 142  --  140 139 141  141 141  K 4.3  --  4.2 3.6 3.8  3.8 4.5  CL 106  --  106 102  --  104  CO2 25  --  25 29  --  27  GLUCOSE 125*  --  147* 202*  --  194*  BUN 20  --  22 33*  --  40*  CREATININE 0.91  --  0.89 0.99  --  0.87  CALCIUM  10.0  --  9.0 9.0  --  9.2  MG  --  2.0 2.0 2.3  --  2.4  PHOS  --   --  3.2  --   --   --     GFR: Estimated Creatinine Clearance: 79.3 mL/min (by C-G formula based on SCr of 0.87 mg/dL). Liver Function Tests: Recent Labs  Lab 01/03/23 0245  AST 22  ALT 16  ALKPHOS 85  BILITOT 0.7  PROT 5.7*  ALBUMIN  2.8*   No results for input(s): LIPASE, AMYLASE in the last 168 hours. No results for input(s): AMMONIA in the last 168 hours. Coagulation Profile: Recent Labs  Lab 01/01/23 1541 01/03/23 0245 01/04/23 0255 01/05/23 0303  INR 2.4* 2.3* 1.8* 1.8*   Cardiac Enzymes: No results for input(s): CKTOTAL, CKMB, CKMBINDEX, TROPONINI in the last 168 hours. BNP (last 3 results) Recent Labs    11/04/22 1519  PROBNP 416.0*   HbA1C: Recent Labs    01/02/23 2104  HGBA1C 5.9*   CBG: Recent Labs  Lab 01/04/23 1133 01/04/23 1259 01/04/23 1616 01/04/23 2118 01/05/23 0629  GLUCAP 133* 212* 172* 153* 174*   Lipid Profile: No results for input(s): CHOL, HDL, LDLCALC, TRIG, CHOLHDL, LDLDIRECT in the last 72 hours. Thyroid  Function Tests: No results for input(s): TSH, T4TOTAL, FREET4, T3FREE, THYROIDAB in the last 72 hours. Anemia Panel: No results for input(s): VITAMINB12, FOLATE, FERRITIN, TIBC, IRON, RETICCTPCT in the last 72 hours. Urine analysis:    Component Value Date/Time   COLORURINE YELLOW 09/17/2022 2236   APPEARANCEUR HAZY (A) 09/17/2022 2236   LABSPEC 1.010 09/17/2022 2236   PHURINE 6.5 09/17/2022 2236   GLUCOSEU NEGATIVE 09/17/2022 2236   HGBUR NEGATIVE 09/17/2022 2236   BILIRUBINUR NEGATIVE 09/17/2022 2236   KETONESUR NEGATIVE 09/17/2022 2236   PROTEINUR NEGATIVE 09/17/2022 2236   UROBILINOGEN 0.2 12/24/2011 0805   NITRITE NEGATIVE 09/17/2022 2236   LEUKOCYTESUR SMALL (A) 09/17/2022 2236   Sepsis Labs: @LABRCNTIP (procalcitonin:4,lacticidven:4)  ) Recent Results (from the past 240 hours)  Resp panel by RT-PCR (RSV, Flu A&B, Covid) Anterior Nasal Swab     Status: None   Collection Time: 01/01/23   3:41 PM   Specimen: Anterior Nasal Swab  Result Value Ref Range Status   SARS Coronavirus 2 by RT PCR NEGATIVE NEGATIVE Final    Comment: (NOTE) SARS-CoV-2 target nucleic acids are NOT DETECTED.  The SARS-CoV-2 RNA is generally detectable in upper respiratory specimens during the acute phase of infection. The lowest concentration of SARS-CoV-2 viral copies this assay can detect is 138 copies/mL. A negative result does not preclude SARS-Cov-2 infection and should not be used as the sole basis for treatment or other patient management decisions. A negative result may occur with  improper specimen collection/handling, submission of specimen other than nasopharyngeal swab, presence of viral mutation(s) within the areas targeted by this assay, and inadequate number of viral copies(<138 copies/mL). A negative result must be combined with clinical observations, patient history, and epidemiological information. The expected result is Negative.  Fact Sheet for  Patients:  bloggercourse.com  Fact Sheet for Healthcare Providers:  seriousbroker.it  This test is no t yet approved or cleared by the [place]  FDA and  has been authorized for detection and/or diagnosis of SARS-CoV-2 by FDA under an Emergency Use Authorization (EUA). This EUA will remain  in effect (meaning this test can be used) for the duration of the COVID-19 declaration under Section 564(b)(1) of the Act, 21 U.S.C.section 360bbb-3(b)(1), unless the authorization is terminated  or revoked sooner.       Influenza A by PCR NEGATIVE NEGATIVE Final   Influenza B by PCR NEGATIVE NEGATIVE Final    Comment: (NOTE) The Xpert Xpress SARS-CoV-2/FLU/RSV plus assay is intended as an aid in the diagnosis of influenza from Nasopharyngeal swab specimens and should not be used as a sole basis for treatment. Nasal washings and aspirates are unacceptable for Xpert Xpress  SARS-CoV-2/FLU/RSV testing.  Fact Sheet for Patients: bloggercourse.com  Fact Sheet for Healthcare Providers: seriousbroker.it  This test is not yet approved or cleared by the [place]  FDA and has been authorized for detection and/or diagnosis of SARS-CoV-2 by FDA under an Emergency Use Authorization (EUA). This EUA will remain in effect (meaning this test can be used) for the duration of the COVID-19 declaration under Section 564(b)(1) of the Act, 21 U.S.C. section 360bbb-3(b)(1), unless the authorization is terminated or revoked.     Resp Syncytial Virus by PCR NEGATIVE NEGATIVE Final    Comment: (NOTE) Fact Sheet for Patients: bloggercourse.com  Fact Sheet for Healthcare Providers: seriousbroker.it  This test is not yet approved or cleared by the [place]  FDA and has been authorized for detection and/or diagnosis of SARS-CoV-2 by FDA under an Emergency Use Authorization (EUA). This EUA will remain in effect (meaning this test can be used) for the duration of the COVID-19 declaration under Section 564(b)(1) of the Act, 21 U.S.C. section 360bbb-3(b)(1), unless the authorization is terminated or revoked.  Performed at [facility], [address] [place], [place] [address]      Radiology Studies: CARDIAC CATHETERIZATION Result Date: 01/04/2023 1. Normal filling pressures. 2. Mild pulmonary hypertension without elevated PVR. 3. Normal cardiac output.     Scheduled Meds:  aspirin   81 mg Oral Daily   carvedilol   12.5 mg Oral BID   dofetilide   500 mcg Oral BID   feeding supplement  237 mL Oral BID BM   ferrous sulfate   325 mg Oral Daily   furosemide   40 mg Oral Daily   insulin  aspart  0-15 Units Subcutaneous TID WC   insulin  aspart  0-5 Units Subcutaneous QHS   levothyroxine   137 mcg Oral QAC breakfast   loratadine   10 mg Oral Daily    pantoprazole   40 mg Oral Daily   pneumococcal 20-valent conjugate vaccine  0.5 mL Intramuscular Tomorrow-1000   [START ON 01/06/2023] predniSONE   60 mg Oral Q breakfast   venlafaxine  XR  150 mg Oral Q breakfast   warfarin  2.5 mg Oral ONCE-1600   Warfarin - Pharmacist Dosing Inpatient   Does not apply q1600   Continuous Infusions:   LOS: 3 days    Time spent:    Sigurd Pac, MD [facility]   01/05/2023, 10:50 AM

## 2023-01-05 NOTE — Progress Notes (Addendum)
 01/05/2023  78/M with history of Wegener's granulomatosis, interstitial lung disease, former smoker, CAD/CABG, paroxysmal A-fib compliant on warfarin, diastolic CHF was hospitalized in 9/24 with presumed flare of ILD/Wegener's, treated with high-dose steroids, subsequently has been weaned down to prednisone  10 mg daily recently. Pt. Is also getting Rituxan  infusions for his Wegners. He states that the last time he flared, it was 3 weeks  before his next Rituxan  Infusion, as it is with this current flare. He feels this is a pattern.    Pt. Presented to the ED 12/28 with progressive dyspnea on exertion, cough, no fevers or chills, no edema orthopnea, reports worsening hypoxia at home x 1 week.  Hospital course 12/29 admitted for acute hypoxic resp failure. Systemic steroids started. Got lasix  12/31 right heart cath NML filling pressures. Mild PH w/ elevated PVR nml CO   S Feels better.  Wants to get out of bed  O: BP 133/65 (BP Location: Left Arm)   Pulse 70   Temp 98.2 F (36.8 C) (Oral)   Resp 20   Ht 5' 10 (1.778 m)   Wt 94.1 kg   SpO2 91%   BMI 29.77 kg/m   Intake/Output Summary (Last 24 hours) at 01/05/2023 0748 Last data filed at 01/05/2023 0542 Gross per 24 hour  Intake 1080 ml  Output 1250 ml  Net -170 ml    General 78 year old male patient resting in bed no acute distress on 4 L HEENT normocephalic atraumatic no jugular venous distention appreciated Pulmonary: Basilar crackles.  Currently 4 L/min without accessory use speaking in full sentences.Portable chest x-ray personally reviewed: Showing some improvement in bilateral airspace disease Cardiac regular rate and rhythm Abdomen: Soft nontender Extremities: Warm dry good cap refill no edema Neuro awake alert appropriate deficits  Assessment/plan hypoxic respiratory failure Wegener's granulomatosis ILD HFrEF  CAF on warfarin Coronary artery disease Hyperlipidemia Type 2 diabetes Hypertension OSA PAD   Pulm prob  list  Acute hypoxic resp failure 2/2 AE-ILD (GPA); is doing a little better Mild group 3 pulm HTN  Discussion O2 needs: Still 4 L desaturates with activity, but then recovers quickly Sputum pending  Plan Today is #4 IV systemic steroids should be able to switch to oral prednisone  in the morning 60 mg daily, followed by a very slow taper.  Outpatient follow-up Continue to wean supplemental oxygen  Follow-up in sputum He will need walking oximetry to determine home oxygen  needs, I think he will require more than 2 to 4 L which is his baseline need Continuing diuresis as BUN blood pressure and creatinine tolerate Need to reevaluate his maintenance regimen in the outpatient setting   Gary [name]    ATTESTATION & SIGNATURE   STAFF NOTE: I, Dr CHRISTELLA Cave have personally reviewed patient's available data, including medical history, events of note, physical examination and test results as part of my evaluation. I have discussed with resident/NP and other care providers such as pharmacist, RN and RRT.  In addition,  I personally evaluated patient and elicited key findings of   S: Date of admit 01/01/2023 with LOS 3 for today 01/05/2023 : Glean ONEIDA Lighter is  better after steroid. Says steroid taper correlated with admission but also says this is 2nd admission that correlates with month 5 of Rituxan  (he gets d0 and d15 x q 6months)  Currently 3L [place] rest 94% (baseline 2L [place] at rest and 4LNC exertion)  Not started esbriet  as yet - ordered 12/21/22  RC - ild pulmonary hypertension by new definition (mean  PA pressure 23 ). PWCP 11, PVR 1.7  O:  Blood pressure 116/65, pulse 71, temperature 98.2 F (36.8 C), temperature source Oral, resp. rate 19, height 5' 10 (1.778 m), weight 94.1 kg, SpO2 95%.   Looks same as in office On o2 Deconditoioned   LABS    PULMONARY Recent Labs  Lab 01/03/23 0245 01/04/23 0855  HCO3 26.5 31.3*  31.7*  TCO2  --  33*  33*  O2SAT 98.5 57  56     CBC Recent Labs  Lab 01/03/23 0245 01/04/23 0255 01/04/23 0855 01/05/23 0303  HGB 9.3* 9.4* 10.2*  9.9* 9.6*  HCT 30.9* 31.8* 30.0*  29.0* 32.3*  WBC 17.3* 13.6*  --  15.2*  PLT 432* 483*  --  512*    COAGULATION Recent Labs  Lab 01/01/23 1541 01/03/23 0245 01/04/23 0255 01/05/23 0303  INR 2.4* 2.3* 1.8* 1.8*    CARDIAC  No results for input(s): TROPONINI in the last 168 hours. No results for input(s): PROBNP in the last 168 hours.   CHEMISTRY Recent Labs  Lab 01/01/23 1541 01/02/23 2104 01/03/23 0245 01/04/23 0255 01/04/23 0855 01/05/23 0303  NA 142  --  140 139 141  141 141  K 4.3  --  4.2 3.6 3.8  3.8 4.5  CL 106  --  106 102  --  104  CO2 25  --  25 29  --  27  GLUCOSE 125*  --  147* 202*  --  194*  BUN 20  --  22 33*  --  40*  CREATININE 0.91  --  0.89 0.99  --  0.87  CALCIUM  10.0  --  9.0 9.0  --  9.2  MG  --  2.0 2.0 2.3  --  2.4  PHOS  --   --  3.2  --   --   --    Estimated Creatinine Clearance: 79.3 mL/min (by C-G formula based on SCr of 0.87 mg/dL).   LIVER Recent Labs  Lab 01/01/23 1541 01/03/23 0245 01/04/23 0255 01/05/23 0303  AST  --  22  --   --   ALT  --  16  --   --   ALKPHOS  --  85  --   --   BILITOT  --  0.7  --   --   PROT  --  5.7*  --   --   ALBUMIN   --  2.8*  --   --   INR 2.4* 2.3* 1.8* 1.8*     INFECTIOUS Recent Labs  Lab 01/01/23 2156 01/03/23 0245  PROCALCITON <0.10 <0.10     ENDOCRINE CBG (last 3)  Recent Labs    01/04/23 2118 01/05/23 0629 01/05/23 1202  GLUCAP 153* 174* 190*         IMAGING x24h  - image(s) personally visualized  -   highlighted in bold DG Chest Port 1 View Result Date: 01/05/2023 CLINICAL DATA:  Acute respiratory failure. History of hypertension and coronary artery disease. EXAM: PORTABLE CHEST 1 VIEW COMPARISON:  01/01/2023 FINDINGS: Previous median sternotomy and CABG procedure. Lung volumes are low. Diffuse increased reticular and nodular interstitial  opacities within both lungs compatible with previously characterized chronic fibrotic no superimposed pleural effusion, interstitial edema or airspace consolidation. Interstitial lung disease IMPRESSION: 1. No acute findings. 2. Chronic interstitial lung disease. Electronically Signed   By: Waddell Calk M.D.   On: 01/05/2023 12:24      A:  WEgners and ILD Flare ->? >  due to rituxan  weaning off < 6 months v sterpid taper as opd v bpoth  ILD progressive phenotype - awaits esbriet  stat  Pulm htn - Does not meet criteria for Inhaled tyvaso for who-3 PAH  P:  Continu steroids Check B cell flow -if B cell going up then we know Rituxan  weaning off < 6 months and needs tigher frequency  Await esbiret start in office + consider clinical trial of tyvaso as primary Rx for ILD progressive phenotyple     Anti-infectives (From admission, onward)    None        Rest per NP/medical resident whose note is outlined above and that I agree with  Dr. Dorethia Cave, M.D., [facility].C.P Pulmonary and Critical Care Medicine Staff Physician [facility] Pager: [phone], If no answer or between  15:00h - 7:00h: call [phone]  01/05/2023 12:52 PM

## 2023-01-05 NOTE — Progress Notes (Signed)
 BLE venous duplex has been completed.   Results can be found under chart review under CV PROC. 01/05/2023 4:53 PM Justus Droke RVT, RDMS

## 2023-01-06 ENCOUNTER — Encounter (HOSPITAL_COMMUNITY): Payer: Self-pay | Admitting: Cardiology

## 2023-01-06 ENCOUNTER — Telehealth: Payer: Self-pay | Admitting: Internal Medicine

## 2023-01-06 DIAGNOSIS — J9601 Acute respiratory failure with hypoxia: Secondary | ICD-10-CM | POA: Diagnosis not present

## 2023-01-06 DIAGNOSIS — J849 Interstitial pulmonary disease, unspecified: Secondary | ICD-10-CM

## 2023-01-06 LAB — BASIC METABOLIC PANEL
Anion gap: 9 (ref 5–15)
BUN: 37 mg/dL — ABNORMAL HIGH (ref 8–23)
CO2: 29 mmol/L (ref 22–32)
Calcium: 9 mg/dL (ref 8.9–10.3)
Chloride: 103 mmol/L (ref 98–111)
Creatinine, Ser: 0.76 mg/dL (ref 0.61–1.24)
GFR, Estimated: >60 mL/min (ref >60)
Glucose, Bld: 106 mg/dL — ABNORMAL HIGH (ref 70–99)
Potassium: 3.7 mmol/L (ref 3.5–5.1)
Sodium: 141 mmol/L (ref 135–145)

## 2023-01-06 LAB — CBC
HCT: 31.8 % — ABNORMAL LOW (ref 39.0–52.0)
Hemoglobin: 9.6 g/dL — ABNORMAL LOW (ref 13.0–17.0)
MCH: 23.9 pg — ABNORMAL LOW (ref 26.0–34.0)
MCHC: 30.2 g/dL (ref 30.0–36.0)
MCV: 79.3 fL — ABNORMAL LOW (ref 80.0–100.0)
Platelets: 553 10*3/uL — ABNORMAL HIGH (ref 150–400)
RBC: 4.01 MIL/uL — ABNORMAL LOW (ref 4.22–5.81)
RDW: 17.8 % — ABNORMAL HIGH (ref 11.5–15.5)
WBC: 16.8 10*3/uL — ABNORMAL HIGH (ref 4.0–10.5)
nRBC: 0 % (ref 0.0–0.2)

## 2023-01-06 LAB — GLUCOSE, CAPILLARY
Glucose-Capillary: 85 mg/dL (ref 70–99)
Glucose-Capillary: 132 mg/dL — ABNORMAL HIGH (ref 70–99)
Glucose-Capillary: 138 mg/dL — ABNORMAL HIGH (ref 70–99)
Glucose-Capillary: 198 mg/dL — ABNORMAL HIGH (ref 70–99)

## 2023-01-06 LAB — MAGNESIUM: Magnesium: 2.4 mg/dL (ref 1.7–2.4)

## 2023-01-06 LAB — SEDIMENTATION RATE: Sed Rate: 30 mm/h — ABNORMAL HIGH (ref 0–16)

## 2023-01-06 LAB — PROTIME-INR
INR: 2.3 — ABNORMAL HIGH (ref 0.8–1.2)
Prothrombin Time: 25.6 s — ABNORMAL HIGH (ref 11.4–15.2)

## 2023-01-06 MED ORDER — WARFARIN SODIUM 2.5 MG PO TABS
2.5000 mg | ORAL_TABLET | Freq: Once | ORAL | Status: AC
  Administered 2023-01-06: 2.5 mg via ORAL
  Filled 2023-01-06: qty 1

## 2023-01-06 NOTE — Progress Notes (Addendum)
 [place] PULM PROGRESS NOTES   01/06/2023  77/M with history of Wegener's granulomatosis, interstitial lung disease, former smoker, CAD/CABG, paroxysmal A-fib compliant on warfarin, diastolic CHF was hospitalized in 9/24 with presumed flare of ILD/Wegener's, treated with high-dose steroids, subsequently has been weaned down to prednisone  10 mg daily recently. Pt. Is also getting Rituxan  infusions for his Wegners. He states that the last time he flared, it was 3 weeks  before his next Rituxan  Infusion, as it is with this current flare. He feels this is a pattern.    Pt. Presented to the ED 12/28 with progressive dyspnea on exertion, cough, no fevers or chills, no edema orthopnea, reports worsening hypoxia at home x 1 week.  Hospital course 12/29 admitted for acute hypoxic resp failure. Systemic steroids started. Got lasix  12/31 right heart cath NML filling pressures. Mild PH w/ elevated PVR nml CO  01/04/22:   better after steroid. Says steroid taper correlated with admission but also says this is 2nd admission that correlates with month 5 of Rituxan  (he gets d0 and d15 x q 6months) Currently 3L [place] rest 94% (baseline 2L [place] at rest and 4LNC exertion). Not started esbriet  as yet - ordered 12/21/22  SUBJECTIVE/OVERNIGHT/INTERVAL HX   01/05/22: down to 2-3L [place] at rest and needed 6L [place] with exertion. Says beter after higher steroids. Says he feels his Wegener's is active because of getting big crust from his nostrils.  He also feels that the steroid responsiveness suggests his Wegener's is active.  He again feels his Rituxan  is not strong enough.  He has been on Rituxan  for 5 years and approximately in September 2023 it was increased to doing 2 doses every 6 months but he feels his reductions wearing off fast.  Today we sent off a flow cytometry on B cells to evaluate Rituxan  breakthrough with B-cell reemergence.  His procalcitonin is normal this admission.    VITALS & EXA<      01/06/2023    4:24 PM  01/06/2023   11:48 AM 01/06/2023    7:43 AM  Vitals with BMI  Systolic 124 131 862  Diastolic 69 69 72  Pulse 67 63 70   Vitals:   01/06/23 1148 01/06/23 1624  BP: 131/69 124/69  Pulse: 63 67  Resp: 20 20  Temp: 97.8 F (36.6 C) 97.7 F (36.5 C)  SpO2: 98% 96%     General Appearance:  Looks cushingoid Head:  Normocephalic, without obvious abnormality, atraumatic Eyes:  PERRL - yes, conjunctiva/corneas - muddy     Ears:  Normal external ear canals, both ears Nose:  G tube - no bu thas [place] Throat:  ETT TUBE - no , OG tube - no Neck:  Supple,  No enlargement/tenderness/nodules Lungs: Clear to auscultation bilaterally faint crack;les at bse Heart:  S1 and S2 normal, no murmur, CVP - no.  Pressors - no Abdomen:  Soft, no masses, no organomegaly Genitalia / Rectal:  Not done Extremities:  Extremities- intact Skin:  ntact in exposed areas . Sacral area - not examine Neurologic:  Sedation - none -> RASS - +1 . Moves all 4s - yes. CAM-ICU - ng . Orientation - x3+     ASSESSMENT/PLAN    Assessment/plan hypoxic respiratory failure Wegener's granulomatosis ILD HFrEF  CAF on warfarin Coronary artery disease Hyperlipidemia Type 2 diabetes Hypertension OSA PAD    A:  WEgners and ILD Flare ->  > due to rituxan  weaning off < 6 months v sterpid taper as opd v  bpoth --regardless this would indicate active Wegener's.  Subjectively he feels he has active Wegener's.  Plan  - Await B-cell flow cytometry from today - Check G6PD and if this is normal we will start him on Bactrim  1 double strength once a day on Monday Wednesday Friday [3 times a week) for PJP prophylaxis as an outpatient -Continue IV Rituxan  [next dose with Dr. Mai in a couple of weeks] -Continue prednisone  60 mg/day   -This original high dose was started recently in September 2024 and by the time we got down and taper directed by rheumatology he got admitted  -In 2 weeks we will get him down to 40 mg/day but we  might have to hold at 20 mg/day or 10 mg/day  -Will discuss with rheumatology based on B-cell flow to cytometry if any other therapies possible for his Wegener's - get HRCT  ILD progressive phenotype   Plan - awaits esbriet  start as opd -r eminder sent to pharmacy - get HRCT as inpatient  Pulm htn - Does not meet criteria for Inhaled tyvaso for who-3 PAH  P:  Does not meet tyvaso as standard of care criteir Will see if he meets criteria for tyvaso as inhaled trial for progressive ILD - TBD as opd    Followup    1) Ok to go home 01/07/23 2)  Future Appointments  Date Time Provider Department Center  01/07/2023  9:00 AM [facility] LAB [facility] None  01/13/2023  9:00 AM Little, Clayborne CROME, RN [facility] None  01/24/2023 10:00 AM [facility]  CLINIC [facility] None  02/02/2023  9:30 AM Terra Fairy PARAS, PA-C [facility] None  02/03/2023 11:00 AM Hope Almarie ORN, NP [facility]UL[facility]ARE None  03/28/2023 11:30 AM Court Dorn PARAS, MD [facility] None     Ccm will sign off   SIGNATURE    Dr. Dorethia Cave, M.D., F.C.C.P,  Pulmonary and Critical Care Medicine Staff Physician, [facility] Director - Interstitial Lung Disease  Program  Pulmonary Fibrosis [facility] at [facility] [place], [place], [address]   Pager: [phone], If no answer  -> Check AMION or Try [phone] Telephone (clinical office): [phone] Telephone (research): [phone]  5:24 PM 01/06/2023

## 2023-01-06 NOTE — Progress Notes (Signed)
 PROGRESS NOTE    MELL GUIA  FMW:[id number] DOB: [birth year] DOA: 01/01/2023 PCP: Valentin Skates, DO  77/M with history of Wegener's granulomatosis, interstitial lung disease, former smoker, CAD/CABG, paroxysmal A-fib on warfarin, diastolic CHF was hospitalized in 9/24 with presumed flare of ILD/Wegener's, treated with high-dose steroids, subsequently has been weaned down to prednisone  10 mg daily recently.  Presented to the ED 12/28 with progressive dyspnea on exertion, cough, no fevers or chills, no edema orthopnea, reports worsening hypoxia at home. In the ED he was hypoxic initial O2 sats of 72%, placed on high flow, mildly tachypneic, labs noted creatinine 0.9, BNP 513, troponin 32, 75, 104, procalcitonin less than 0.1, WBC 13.8 chest x-ray with chronic findings of interstitial lung disease -Admitted, started on IV steroids -12/30: Pulmonary consulted, recommended RHC -12/31 right heart cath-normal filling pressures, mild PAH  Subjective: -Feels better overall, down to 3 L O2  Assessment and Plan:  Acute hypoxic respiratory failure ILD flare  -On prednisone  taper following recent exacerbation, followed by pulmonary and rheumatology -Flu/COVID/RSV PCR negative -Changing to prednisone  today, continue pulmonary toilet, incentive spirometry, would benefit from pulmonary rehab -Pulm consult appreciated, RHC 12/31 with normal filling pressures, mild pulmonary hypertension, preserved cardiac output -Discharge planning -Check ambulatory O2 sats tomorrow  Leukocytosis Likely reactive, worse on steroids, afebrile, PCT< 0.1  Chronic diastolic CHF Pulm Hypertension -Recent echo 12/24 with EF 65%, grade 1 DD, normal RV, Pulm HTN RVSP 59 -RHC 12/31 with normal filling pressures, mild PAH -Euvolemic, continue Lasix  daily  Type 2 diabetes mellitus -Metformin  and Ozempic on hold -CBGs trending up on steroids, continue low-dose Semglee   Chronic deficiency anemia -Relatively  stable,  Paroxysmal atrial fibrillation -Continue Coreg , Tikosyn , warfarin per pharmacy -Replaced K  Hypothyroidism Continue Synthroid   Depression  Generalized anxiety continue Effexor   DVT prophylaxis: Warfarin Code Status: Full code Family Communication: None present Disposition Plan: Home pending improvement in hypoxia  Consultants:    Procedures:   Antimicrobials:    Objective: Vitals:   01/05/23 2339 01/06/23 0429 01/06/23 0613 01/06/23 0743  BP: (!) 116/59 (!) 141/76  137/72  Pulse: 66 69  70  Resp: 13 18  20   Temp: 98.2 F (36.8 C) 98.4 F (36.9 C)  97.6 F (36.4 C)  TempSrc: Oral Oral  Oral  SpO2: 96% 95%  92%  Weight:   94.6 kg   Height:        Intake/Output Summary (Last 24 hours) at 01/06/2023 1011 Last data filed at 01/06/2023 0848 Gross per 24 hour  Intake 360 ml  Output 100 ml  Net 260 ml   Filed Weights   01/04/23 0504 01/05/23 0602 01/06/23 0613  Weight: 95.6 kg 94.1 kg 94.6 kg    Examination:  General exam: Elderly chronically ill male sitting up in bed, AAOx3 HEENT: No JVD CVS: S1-S2, regular rhythm Lungs: Few scattered rhonchi and rales Abdomen: Soft, nontender, bowel sounds present Extremities: No edema  Skin: No rashes Psychiatry:  Mood & affect appropriate.     Data Reviewed:   CBC: Recent Labs  Lab 01/01/23 1541 01/03/23 0245 01/04/23 0255 01/04/23 0855 01/05/23 0303 01/06/23 0250  WBC 13.8* 17.3* 13.6*  --  15.2* 16.8*  NEUTROABS  --  16.0*  --   --   --   --   HGB 10.6* 9.3* 9.4* 10.2*  9.9* 9.6* 9.6*  HCT 35.9* 30.9* 31.8* 30.0*  29.0* 32.3* 31.8*  MCV 81.8 80.1 80.5  --  80.1 79.3*  PLT 455* 432* 483*  --  512* 553*   Basic Metabolic Panel: Recent Labs  Lab 01/01/23 1541 01/02/23 2104 01/03/23 0245 01/04/23 0255 01/04/23 0855 01/05/23 0303 01/06/23 0250  NA 142  --  140 139 141  141 141 141  K 4.3  --  4.2 3.6 3.8  3.8 4.5 3.7  CL 106  --  106 102  --  104 103  CO2 25  --  25 29  --  27 29   GLUCOSE 125*  --  147* 202*  --  194* 106*  BUN 20  --  22 33*  --  40* 37*  CREATININE 0.91  --  0.89 0.99  --  0.87 0.76  CALCIUM  10.0  --  9.0 9.0  --  9.2 9.0  MG  --  2.0 2.0 2.3  --  2.4 2.4  PHOS  --   --  3.2  --   --   --   --    GFR: Estimated Creatinine Clearance: 86.4 mL/min (by C-G formula based on SCr of 0.76 mg/dL). Liver Function Tests: Recent Labs  Lab 01/03/23 0245  AST 22  ALT 16  ALKPHOS 85  BILITOT 0.7  PROT 5.7*  ALBUMIN  2.8*   No results for input(s): LIPASE, AMYLASE in the last 168 hours. No results for input(s): AMMONIA in the last 168 hours. Coagulation Profile: Recent Labs  Lab 01/01/23 1541 01/03/23 0245 01/04/23 0255 01/05/23 0303 01/06/23 0250  INR 2.4* 2.3* 1.8* 1.8* 2.3*   Cardiac Enzymes: No results for input(s): CKTOTAL, CKMB, CKMBINDEX, TROPONINI in the last 168 hours. BNP (last 3 results) Recent Labs    11/04/22 1519  PROBNP 416.0*   HbA1C: No results for input(s): HGBA1C in the last 72 hours.  CBG: Recent Labs  Lab 01/05/23 0629 01/05/23 1202 01/05/23 1556 01/05/23 2027 01/06/23 0550  GLUCAP 174* 190* 148* 123* 85   Lipid Profile: No results for input(s): CHOL, HDL, LDLCALC, TRIG, CHOLHDL, LDLDIRECT in the last 72 hours. Thyroid  Function Tests: No results for input(s): TSH, T4TOTAL, FREET4, T3FREE, THYROIDAB in the last 72 hours. Anemia Panel: No results for input(s): VITAMINB12, FOLATE, FERRITIN, TIBC, IRON, RETICCTPCT in the last 72 hours. Urine analysis:    Component Value Date/Time   COLORURINE YELLOW 09/17/2022 2236   APPEARANCEUR HAZY (A) 09/17/2022 2236   LABSPEC 1.010 09/17/2022 2236   PHURINE 6.5 09/17/2022 2236   GLUCOSEU NEGATIVE 09/17/2022 2236   HGBUR NEGATIVE 09/17/2022 2236   BILIRUBINUR NEGATIVE 09/17/2022 2236   KETONESUR NEGATIVE 09/17/2022 2236   PROTEINUR NEGATIVE 09/17/2022 2236   UROBILINOGEN 0.2 12/24/2011 0805   NITRITE NEGATIVE  09/17/2022 2236   LEUKOCYTESUR SMALL (A) 09/17/2022 2236   Sepsis Labs: @LABRCNTIP (procalcitonin:4,lacticidven:4)  ) Recent Results (from the past 240 hours)  Resp panel by RT-PCR (RSV, Flu A&B, Covid) Anterior Nasal Swab     Status: None   Collection Time: 01/01/23  3:41 PM   Specimen: Anterior Nasal Swab  Result Value Ref Range Status   SARS Coronavirus 2 by RT PCR NEGATIVE NEGATIVE Final    Comment: (NOTE) SARS-CoV-2 target nucleic acids are NOT DETECTED.  The SARS-CoV-2 RNA is generally detectable in upper respiratory specimens during the acute phase of infection. The lowest concentration of SARS-CoV-2 viral copies this assay can detect is 138 copies/mL. A negative result does not preclude SARS-Cov-2 infection and should not be used as the sole basis for treatment or other patient management decisions. A negative result may occur with  improper specimen collection/handling, submission of specimen  other than nasopharyngeal swab, presence of viral mutation(s) within the areas targeted by this assay, and inadequate number of viral copies(<138 copies/mL). A negative result must be combined with clinical observations, patient history, and epidemiological information. The expected result is Negative.  Fact Sheet for Patients:  bloggercourse.com  Fact Sheet for Healthcare Providers:  seriousbroker.it  This test is no t yet approved or cleared by the [place]  FDA and  has been authorized for detection and/or diagnosis of SARS-CoV-2 by FDA under an Emergency Use Authorization (EUA). This EUA will remain  in effect (meaning this test can be used) for the duration of the COVID-19 declaration under Section 564(b)(1) of the Act, 21 U.S.C.section 360bbb-3(b)(1), unless the authorization is terminated  or revoked sooner.       Influenza A by PCR NEGATIVE NEGATIVE Final   Influenza B by PCR NEGATIVE NEGATIVE Final    Comment:  (NOTE) The Xpert Xpress SARS-CoV-2/FLU/RSV plus assay is intended as an aid in the diagnosis of influenza from Nasopharyngeal swab specimens and should not be used as a sole basis for treatment. Nasal washings and aspirates are unacceptable for Xpert Xpress SARS-CoV-2/FLU/RSV testing.  Fact Sheet for Patients: bloggercourse.com  Fact Sheet for Healthcare Providers: seriousbroker.it  This test is not yet approved or cleared by the [place]  FDA and has been authorized for detection and/or diagnosis of SARS-CoV-2 by FDA under an Emergency Use Authorization (EUA). This EUA will remain in effect (meaning this test can be used) for the duration of the COVID-19 declaration under Section 564(b)(1) of the Act, 21 U.S.C. section 360bbb-3(b)(1), unless the authorization is terminated or revoked.     Resp Syncytial Virus by PCR NEGATIVE NEGATIVE Final    Comment: (NOTE) Fact Sheet for Patients: bloggercourse.com  Fact Sheet for Healthcare Providers: seriousbroker.it  This test is not yet approved or cleared by the [place]  FDA and has been authorized for detection and/or diagnosis of SARS-CoV-2 by FDA under an Emergency Use Authorization (EUA). This EUA will remain in effect (meaning this test can be used) for the duration of the COVID-19 declaration under Section 564(b)(1) of the Act, 21 U.S.C. section 360bbb-3(b)(1), unless the authorization is terminated or revoked.  Performed at [facility], [address], [place], [place] [address]      Radiology Studies: VAS US  LOWER EXTREMITY VENOUS (DVT) Result Date: 01/05/2023  Lower Venous DVT Study Patient Name:  TREYVONE CHELF [facility]  Date of Exam:   01/05/2023 Medical Rec #: [id number]          Accession #:    [id number] Date of Birth: [birth year]           Patient Gender: M Patient Age:   17 years Exam Location:  [facility] Procedure:      VAS US  LOWER EXTREMITY VENOUS (DVT) Referring Phys: TORIBIO SHARPS --------------------------------------------------------------------------------  Indications: Edema.  Anticoagulation: Coumadin . Limitations: Poor ultrasound/tissue interface. Performing Technologist: Ezzie Potters RVT, RDMS  Examination Guidelines: A complete evaluation includes B-mode imaging, spectral Doppler, color Doppler, and power Doppler as needed of all accessible portions of each vessel. Bilateral testing is considered an integral part of a complete examination. Limited examinations for reoccurring indications may be performed as noted. The reflux portion of the exam is performed with the patient in reverse Trendelenburg.  +--------+---------------+---------+-----------+----------+--------------------+ RIGHT   CompressibilityPhasicitySpontaneityPropertiesThrombus Aging       +--------+---------------+---------+-----------+----------+--------------------+ CFV     Full           Yes  Yes                                       +--------+---------------+---------+-----------+----------+--------------------+ SFJ     Full                                                              +--------+---------------+---------+-----------+----------+--------------------+ FV Prox Full           Yes      Yes                                       +--------+---------------+---------+-----------+----------+--------------------+ FV Mid  Full           Yes      Yes                                       +--------+---------------+---------+-----------+----------+--------------------+ FV                     Yes      Yes                  patent by            Distal                                               color/doppler        +--------+---------------+---------+-----------+----------+--------------------+ PFV     Full                                                               +--------+---------------+---------+-----------+----------+--------------------+ POP     Full           Yes      Yes                                       +--------+---------------+---------+-----------+----------+--------------------+ PTV     Full                                                              +--------+---------------+---------+-----------+----------+--------------------+ PERO    Full                                                              +--------+---------------+---------+-----------+----------+--------------------+   +--------+---------------+---------+-----------+----------+--------------------+  LEFT    CompressibilityPhasicitySpontaneityPropertiesThrombus Aging       +--------+---------------+---------+-----------+----------+--------------------+ CFV     Full           Yes      Yes                                       +--------+---------------+---------+-----------+----------+--------------------+ SFJ     Full                                                              +--------+---------------+---------+-----------+----------+--------------------+ FV Prox Full           Yes      Yes                                       +--------+---------------+---------+-----------+----------+--------------------+ FV Mid  Full           Yes      Yes                                       +--------+---------------+---------+-----------+----------+--------------------+ FV                     Yes      Yes                  patent by            Distal                                               color/doppler        +--------+---------------+---------+-----------+----------+--------------------+ PFV     Full                                                              +--------+---------------+---------+-----------+----------+--------------------+ POP     Full           Yes      Yes                                        +--------+---------------+---------+-----------+----------+--------------------+ PTV     Full                                                              +--------+---------------+---------+-----------+----------+--------------------+ PERO    Full                                                              +--------+---------------+---------+-----------+----------+--------------------+  Summary: BILATERAL: - No evidence of deep vein thrombosis seen in the lower extremities, bilaterally. -No evidence of popliteal cyst, bilaterally.   *See table(s) above for measurements and observations. Electronically signed by Penne Colorado MD on 01/05/2023 at 5:34:57 PM.    Final    DG Chest Port 1 View Result Date: 01/05/2023 CLINICAL DATA:  Acute respiratory failure. History of hypertension and coronary artery disease. EXAM: PORTABLE CHEST 1 VIEW COMPARISON:  01/01/2023 FINDINGS: Previous median sternotomy and CABG procedure. Lung volumes are low. Diffuse increased reticular and nodular interstitial opacities within both lungs compatible with previously characterized chronic fibrotic no superimposed pleural effusion, interstitial edema or airspace consolidation. Interstitial lung disease IMPRESSION: 1. No acute findings. 2. Chronic interstitial lung disease. Electronically Signed   By: Waddell Calk M.D.   On: 01/05/2023 12:24     Scheduled Meds:  aspirin   81 mg Oral Daily   carvedilol   12.5 mg Oral BID   dofetilide   500 mcg Oral BID   feeding supplement  237 mL Oral BID BM   ferrous sulfate   325 mg Oral Daily   furosemide   40 mg Oral Daily   insulin  aspart  0-15 Units Subcutaneous TID WC   insulin  aspart  0-5 Units Subcutaneous QHS   insulin  glargine-yfgn  10 Units Subcutaneous Daily   levothyroxine   137 mcg Oral QAC breakfast   loratadine   10 mg Oral Daily   pantoprazole   40 mg Oral Daily   pneumococcal 20-valent conjugate vaccine  0.5 mL Intramuscular Tomorrow-1000   predniSONE   60 mg Oral  Q breakfast   venlafaxine  XR  150 mg Oral Q breakfast   warfarin  2.5 mg Oral ONCE-1600   Warfarin - Pharmacist Dosing Inpatient   Does not apply q1600   Continuous Infusions:   LOS: 4 days    Time spent:    Sigurd Pac, MD [facility]   01/06/2023, 10:11 AM

## 2023-01-06 NOTE — TOC Progression Note (Signed)
 Transition of Care [facility]) - Progression Note    Patient Details  Name: Gary [name] MRN: [id number] Date of Birth: 1945-04-07  Transition of Care [facility]) CM/SW Contact  Nola Devere Hands, RN Phone Number: 01/06/2023, 11:00 AM  Clinical Narrative:    Patient is a 78 yr old gentleman admitted with Respiratory failure d/t ILD failure. Patient lives home with wife and states he was independent . Case Manager discussed recommendation for Home Health PT at discharge. Case Manager received permission to arrange. Referral called to [facility], Liaison for [facility]. No DME needs identified. Patient says he would like to do [facility] therapy and transition to outpatient therapy. .    Expected Discharge Plan: Home w Home Health Services Barriers to Discharge: Continued Medical Work up  Expected Discharge Plan and Services In-house Referral: NA Discharge Planning Services: CM Consult Post Acute Care Choice: Home Health Living arrangements for the past 2 months: Single Family Home                 DME Arranged: N/A DME Agency: NA       HH Arranged: PT HH Agency: [facility] Date HH Agency Contacted: 01/06/23 Time HH Agency Contacted: 1024 Representative spoke with at [facility] Agency: [place]   Social Determinants of Health (SDOH) Interventions SDOH Screenings   Food Insecurity: No Food Insecurity (01/02/2023)  Housing: Low Risk  (01/02/2023)  Transportation Needs: No Transportation Needs (01/02/2023)  Utilities: Not At Risk (01/02/2023)  Social Connections: Unknown (01/04/2023)  Tobacco Use: Medium Risk (01/02/2023)    Readmission Risk Interventions    03/02/2022    4:16 PM  Readmission Risk Prevention Plan  Transportation Screening Complete  HRI or Home Care Consult Complete  Palliative Care Screening Not Applicable  Medication Review (RN Care Manager) Complete

## 2023-01-06 NOTE — Plan of Care (Signed)

## 2023-01-06 NOTE — Progress Notes (Signed)
 Mobility Specialist Progress Note:   01/06/23 1430  Mobility  Activity Ambulated with assistance in hallway  Level of Assistance Contact guard assist, steadying assist  Assistive Device None  Distance Ambulated (ft) 150 ft  Activity Response Tolerated well  Mobility Referral Yes  Mobility visit 1 Mobility  Mobility Specialist Start Time (ACUTE ONLY) 1430  Mobility Specialist Stop Time (ACUTE ONLY) 1500  Mobility Specialist Time Calculation (min) (ACUTE ONLY) 30 min   Pt agreeable to mobility session. Required only minG for safety with no AD use. SpO2 at rest on 2L: 90%. While ambulating, pt requiring 6L and cues to limit talking and focus on breathing, as pt desats while talking. Able to titrate back down to 2L, SpO2 90% at end of session.  Therisa Rana Mobility Specialist Please contact via SecureChat or  Rehab office at [phone]

## 2023-01-06 NOTE — Telephone Encounter (Signed)
 Front desk -please schedule a visit after pulmonary function test [I ordered] with [name] somewhere in mid to late February/mid March 23 2528-minute visit but after spirometry and DLCO for ILD and posthospital follow-up

## 2023-01-06 NOTE — Evaluation (Signed)
 Occupational Therapy Evaluation Patient Details Name: Gary [name] MRN: [id number] DOB: 25-Nov-1945 Today's Date: 01/06/2023   History of Present Illness Pt is 78 yo male admitted on 01/01/23 with resp failure due to ILD flair.  Pt with hx including but not limited to Wegener's granulomatosis, ILD, former smoker, CAD/CABG, afib, CHF, DM2   Clinical Impression   Pt presents with decline in function and safety with ADLs and ADL mobility with impaired endurance and activity tolerance. PTA pt lived at home with his wife and was Ind with ADLs/selfcare, IADLs, was driving and used 2L O2 at night. Pt currently on 2L O2 and requires Sup with ADLs and ADL mobility tasks. At rest, pt's O2 SATs 92% and dropping to 86% once sitting EOB, HR at rest 89, increasing to 137 once standing. Pt instructed to sit and perform deep pursed lip breathing with O2 recovering to 95% and HR to 75-80s. Pt educated on energy conservation. Pt would benefit from acute OT services to address impairments to maximize level of function and safety      If plan is discharge home, recommend the following: A little help with bathing/dressing/bathroom;A little help with walking and/or transfers;Assistance with cooking/housework;Assist for transportation    Functional Status Assessment  Patient has had a recent decline in their functional status and demonstrates the ability to make significant improvements in function in a reasonable and predictable amount of time.  Equipment Recommendations  None recommended by OT    Recommendations for Other Services       Precautions / Restrictions Precautions Precautions: Other (comment) Precaution Comments: Watch Sats (orders for 88-92%) Restrictions Weight Bearing Restrictions Per Provider Order: No      Mobility Bed Mobility Overal bed mobility: Needs Assistance Bed Mobility: Supine to Sit, Sit to Supine     Supine to sit: Modified independent (Device/Increase time) Sit to  supine: Modified independent (Device/Increase time)        Transfers Overall transfer level: Needs assistance Equipment used: None Transfers: Sit to/from Stand Sit to Stand: Supervision                  Balance                                           ADL either performed or assessed with clinical judgement   ADL Overall ADL's : Needs assistance/impaired Eating/Feeding: Independent   Grooming: Wash/dry hands;Wash/dry face;Supervision/safety;Standing   Upper Body Bathing: Modified independent   Lower Body Bathing: Supervison/ safety;Sit to/from stand   Upper Body Dressing : Modified independent   Lower Body Dressing: Supervision/safety;Sit to/from stand   Toilet Transfer: Supervision/safety;Ambulation;Stand-pivot   Toileting- Clothing Manipulation and Hygiene: Supervision/safety;Sit to/from stand       Functional mobility during ADLs: Supervision/safety       Vision Baseline Vision/History: 1 Wears glasses Ability to See in Adequate Light: 0 Adequate Patient Visual Report: No change from baseline       Perception         Praxis         Pertinent Vitals/Pain Pain Assessment Pain Assessment: No/denies pain     Extremity/Trunk Assessment Upper Extremity Assessment Upper Extremity Assessment: Overall WFL for tasks assessed   Lower Extremity Assessment Lower Extremity Assessment: Defer to PT evaluation       Communication     Cognition Arousal: Alert Behavior During Therapy: [facility] for tasks assessed/performed  Overall Cognitive Status: Within Functional Limits for tasks assessed                                 General Comments: pleasant , motivated, talkative/hyperverbose and sometimes needs cueing to focus on breathing     General Comments       Exercises     Shoulder Instructions      Home Living Family/patient expects to be discharged to:: Private residence Living Arrangements: Spouse/significant  other Available Help at Discharge: Family;Available 24 hours/day Type of Home: House Home Access: Stairs to enter [employer] of Steps: 2 Entrance Stairs-Rails: Right Home Layout: Two level;Able to live on main level with bedroom/bathroom     Bathroom Shower/Tub: Producer, Television/film/video: Standard     Home Equipment: Grab bars - tub/shower;Shower Counsellor (2 wheels);Rollator (4 wheels)   Additional Comments: On 2 L O2 at night and has for activity if needed. Does have a pulse oximeter      Prior Functioning/Environment Prior Level of Function : Independent/Modified Independent;Driving             Mobility Comments: Walks in home without AD.  Ambulates in home without O2 but needs rest breaks.  Does ambulate in community but needs his O2. ADLs Comments: Reports independent with adls and iadls        OT Problem List: Decreased activity tolerance      OT Treatment/Interventions: Self-care/ADL training;Energy conservation;Therapeutic activities;DME and/or AE instruction    OT Goals(Current goals can be found in the care plan section) Acute Rehab OT Goals Patient Stated Goal: go home OT Goal Formulation: With patient Time For Goal Achievement: 01/20/23 Potential to Achieve Goals: Good ADL Goals Pt Will Perform Grooming: with modified independence;standing Pt Will Perform Lower Body Bathing: with modified independence;sit to/from stand Pt Will Perform Lower Body Dressing: with modified independence;sit to/from stand Pt Will Transfer to Toilet: with modified independence;ambulating Pt Will Perform Toileting - Clothing Manipulation and hygiene: with modified independence Additional ADL Goal #1: Pt will verbalize and demo 3 energy conservation strategies for ADLs and ADL mobility tasks Additional ADL Goal #2: Pt will gather ADL and toiletry items with Sup - Mod I  OT Frequency: Min 1X/week    Co-evaluation              AM-PAC OT 6  Clicks Daily Activity     Outcome Measure Help from another person eating meals?: None Help from another person taking care of personal grooming?: A Little Help from another person toileting, which includes using toliet, bedpan, or urinal?: A Little Help from another person bathing (including washing, rinsing, drying)?: A Little Help from another person to put on and taking off regular upper body clothing?: None Help from another person to put on and taking off regular lower body clothing?: A Little 6 Click Score: 20   End of Session Equipment Utilized During Treatment: Gait belt Nurse Communication: Mobility status;Other (comment) (VSS)  Activity Tolerance: Patient tolerated treatment well Patient left: in bed (sitting EOB)  OT Visit Diagnosis: Other abnormalities of gait and mobility (R26.89)                Time: 8948-8877 OT Time Calculation (min): 31 min Charges:  OT General Charges $OT Visit: 1 Visit OT Evaluation $OT Eval Moderate Complexity: 1 Mod OT Treatments $Self Care/Home Management : 8-22 mins $Therapeutic Activity: 8-22 mins    Jacques,  Karna Loose 01/06/2023, 12:32 PM

## 2023-01-06 NOTE — Progress Notes (Signed)
 Nurse requested Mobility Specialist to perform oxygen  saturation test with pt which includes removing pt from oxygen  both at rest and while ambulating.  Below are the results from that testing.     Patient Saturations on Room Air at Rest = spO2 83%  Patient Saturations on Room Air while Ambulating = sp02 N/A% .   Patient Saturations on 6 Liters of oxygen  while Ambulating = sp02 90%  At end of testing pt left in room on 2  Liters of oxygen .  Reported results to nurse.   Therisa Rana Mobility Specialist Please contact via SecureChat or  Rehab office at [phone]

## 2023-01-06 NOTE — Progress Notes (Signed)
 SATURATION QUALIFICATIONS: (This note is used to comply with regulatory documentation for home oxygen )  Patient Saturations on 2 L oxygen  at Rest = 92%  Patient Saturations on 2L oxygen  while Ambulating = 81%  Patient Saturations on 6 Liters of oxygen  while Ambulating = 94%  Please briefly explain why patient needs home oxygen : Patient already on oxygen  at home. Required up to 6L oxygen  to maintain oxygen  level above 90s. Verdel LOISE Shams, RN

## 2023-01-06 NOTE — Progress Notes (Signed)
 PHARMACY - ANTICOAGULATION CONSULT NOTE  Pharmacy Consult for warfarin Indication: atrial fibrillation  Allergies  Allergen Reactions   Farxiga  [Dapagliflozin ] Other (See Comments)    Lightheadedness Low blood pressure   Statins Other (See Comments)    Myalgias     Patient Measurements: Height: 5' 10 (177.8 cm) Weight: 94.6 kg (208 lb 8.9 oz) IBW/kg (Calculated) : 73 Heparin  Dosing Weight: n/a  Vital Signs: Temp: 97.6 F (36.4 C) (01/02 0743) Temp Source: Oral (01/02 0743) BP: 137/72 (01/02 0743) Pulse Rate: 70 (01/02 0743)  Labs: Recent Labs    01/04/23 0255 01/04/23 0855 01/05/23 0303 01/06/23 0250  HGB 9.4* 10.2*  9.9* 9.6* 9.6*  HCT 31.8* 30.0*  29.0* 32.3* 31.8*  PLT 483*  --  512* 553*  LABPROT 20.8*  --  20.8* 25.6*  INR 1.8*  --  1.8* 2.3*  CREATININE 0.99  --  0.87 0.76    Estimated Creatinine Clearance: 86.4 mL/min (by C-G formula based on SCr of 0.76 mg/dL).   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 Pharmacy consulted to manage/dose warfarin.   Of note, patient is now on high-dose methylprednisolone  which has been documented to both increase and decrease warfarin's efficacy (increase and decrease INR). Will monitor INR closely. No other DDIs identified.   INR therapeutic today  Goal of Therapy:  INR 2-3 Monitor platelets by anticoagulation protocol: Yes   Plan:  Warfarin 2.5 mg x1 dose   Daily INR, CBC F/u s/sx bleeding  F/u new DDIs  Thank you Olam Monte, PharmD

## 2023-01-07 ENCOUNTER — Inpatient Hospital Stay (HOSPITAL_COMMUNITY): Payer: Medicare Other

## 2023-01-07 ENCOUNTER — Other Ambulatory Visit: Payer: Self-pay

## 2023-01-07 ENCOUNTER — Other Ambulatory Visit (HOSPITAL_COMMUNITY): Payer: Medicare Other

## 2023-01-07 ENCOUNTER — Other Ambulatory Visit (HOSPITAL_COMMUNITY): Payer: Self-pay

## 2023-01-07 DIAGNOSIS — J9601 Acute respiratory failure with hypoxia: Secondary | ICD-10-CM | POA: Diagnosis not present

## 2023-01-07 LAB — MAGNESIUM: Magnesium: 2.4 mg/dL (ref 1.7–2.4)

## 2023-01-07 LAB — CBC
HCT: 31.3 % — ABNORMAL LOW (ref 39.0–52.0)
Hemoglobin: 9.4 g/dL — ABNORMAL LOW (ref 13.0–17.0)
MCH: 24.1 pg — ABNORMAL LOW (ref 26.0–34.0)
MCHC: 30 g/dL (ref 30.0–36.0)
MCV: 80.3 fL (ref 80.0–100.0)
Platelets: 552 10*3/uL — ABNORMAL HIGH (ref 150–400)
RBC: 3.9 MIL/uL — ABNORMAL LOW (ref 4.22–5.81)
RDW: 17.6 % — ABNORMAL HIGH (ref 11.5–15.5)
WBC: 11.7 10*3/uL — ABNORMAL HIGH (ref 4.0–10.5)
nRBC: 0.3 % — ABNORMAL HIGH (ref 0.0–0.2)

## 2023-01-07 LAB — FLOW CYTOMETRY REQUEST - FLUID (INPATIENT)

## 2023-01-07 LAB — BASIC METABOLIC PANEL
Anion gap: 7 (ref 5–15)
BUN: 29 mg/dL — ABNORMAL HIGH (ref 8–23)
CO2: 31 mmol/L (ref 22–32)
Calcium: 8.8 mg/dL — ABNORMAL LOW (ref 8.9–10.3)
Chloride: 103 mmol/L (ref 98–111)
Creatinine, Ser: 0.88 mg/dL (ref 0.61–1.24)
GFR, Estimated: >60 mL/min (ref >60)
Glucose, Bld: 108 mg/dL — ABNORMAL HIGH (ref 70–99)
Potassium: 4 mmol/L (ref 3.5–5.1)
Sodium: 141 mmol/L (ref 135–145)

## 2023-01-07 LAB — PROTIME-INR
INR: 1.9 — ABNORMAL HIGH (ref 0.8–1.2)
Prothrombin Time: 21.8 s — ABNORMAL HIGH (ref 11.4–15.2)

## 2023-01-07 LAB — GLUCOSE, CAPILLARY
Glucose-Capillary: 86 mg/dL (ref 70–99)
Glucose-Capillary: 183 mg/dL — ABNORMAL HIGH (ref 70–99)
Glucose-Capillary: 181 mg/dL — ABNORMAL HIGH (ref 70–99)
Glucose-Capillary: 139 mg/dL — ABNORMAL HIGH (ref 70–99)

## 2023-01-07 MED ORDER — DAPSONE 100 MG PO TABS: 100.0000 mg | ORAL_TABLET | Freq: Every day | ORAL | 1 refills | Status: DC

## 2023-01-07 MED ORDER — POTASSIUM CHLORIDE CRYS ER 20 MEQ PO TBCR
20.0000 meq | EXTENDED_RELEASE_TABLET | Freq: Every day | ORAL | 1 refills | Status: DC
  Filled 2023-01-07: qty 30, 30d supply, fill #0

## 2023-01-07 MED ORDER — DAPSONE 100 MG PO TABS
100.0000 mg | ORAL_TABLET | Freq: Every day | ORAL | 0 refills | Status: DC
  Filled 2023-01-07: qty 30, 30d supply, fill #0

## 2023-01-07 MED ORDER — WARFARIN SODIUM 5 MG PO TABS
5.0000 mg | ORAL_TABLET | Freq: Once | ORAL | Status: AC
  Administered 2023-01-07: 5 mg via ORAL
  Filled 2023-01-07: qty 1

## 2023-01-07 MED ORDER — FUROSEMIDE 40 MG PO TABS
40.0000 mg | ORAL_TABLET | Freq: Every day | ORAL | 1 refills | Status: DC
  Filled 2023-01-07: qty 30, 30d supply, fill #0

## 2023-01-07 MED ORDER — PREDNISONE 20 MG PO TABS
ORAL_TABLET | ORAL | 1 refills | Status: DC
  Filled 2023-01-07: qty 180, 30d supply, fill #0

## 2023-01-07 NOTE — Discharge Summary (Addendum)
 Physician Discharge Summary  Gary [name] FMW:[id number] DOB: August 31, 1945 DOA: 01/01/2023  PCP: Valentin Skates, DO  Admit date: 01/01/2023 Discharge date: 01/08/2023  Time spent: 45 minutes  Recommendations for Outpatient Follow-up:  Pulmonary Dr. Geronimo in 2 weeks Cut down prednisone  dose from 60-40 Mg daily in 2 weeks Follow-up with Dr. 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)   Essential hypertension   Interstitial lung disease (HCC)   Elevated troponin   SIRS (systemic inflammatory response syndrome) (HCC)   Leukocytosis   Chronic iron deficiency anemia   Allergic rhinitis   Acquired hypothyroidism   Depression   GAD (generalized anxiety disorder)   Discharge Condition: Improving  Diet recommendation: Low-sodium, diabetic  Filed Weights   01/05/23 0602 01/06/23 0613 01/08/23 0447  Weight: 94.1 kg 94.6 kg 94.6 kg    History of present illness:  78/M with history of Wegener's granulomatosis, interstitial lung disease, former smoker, CAD/CABG, paroxysmal A-fib on warfarin, diastolic CHF was hospitalized in 9/24 with presumed flare of ILD/Wegener's, treated with high-dose steroids, subsequently has been weaned down to prednisone  10 mg daily recently.  Presented to the ED 12/28 with progressive dyspnea on exertion, cough, no fevers or chills, no edema orthopnea, reports worsening hypoxia at home. In the ED he was hypoxic initial O2 sats of 72%, placed on high flow, mildly tachypneic, labs noted creatinine 0.9, BNP 513, troponin 32, 75, 104, procalcitonin less than 0.1, WBC 13.8 chest x-ray with chronic findings of interstitial lung disease -Admitted, started on IV steroids -12/30:  Pulmonary consulted, recommended RHC -12/31 right heart cath-normal filling pressures, mild [facility]  Hospital Course:   Acute hypoxic respiratory failure ILD flare  -On prednisone  taper following recent exacerbation, followed by pulmonary and rheumatology -Flu/COVID/RSV PCR negative -Pulm consulted, they recommended right heart cath, this was completed by Dr. Rolan [facility] 12/31 with normal filling pressures, mild pulmonary hypertension, preserved cardiac output -Seen by Dr. Bubba, recommended prednisone  60 Mg daily at discharge to be taper down to 40 Mg daily in 2 weeks, follow-up with Dr. Mai for rituximab  injections -Discharged home 3 L O2 at rest, needs up to 6 L with ambulation -Follow-up with pulmonary and rheumatology   Leukocytosis Likely reactive, worse on steroids, afebrile, PCT< 0.1   Chronic diastolic CHF Pulm Hypertension -Recent echo 12/24 with EF 65%, grade 1 DD, normal RV, Pulm HTN RVSP 59 -RHC 12/31 with normal filling pressures, mild PAH -Euvolemic, continue Lasix  daily   Type 2 diabetes mellitus -Metformin  and Ozempic on hold -To be resumed at discharge    Chronic deficiency anemia -Relatively stable,   Paroxysmal atrial fibrillation -Continue Coreg , Tikosyn , warfarin  -Replaced K   Hypothyroidism Continue Synthroid    Depression  Generalized anxiety continue Effexor     Discharge Exam: Vitals:   01/08/23 0738 01/08/23 1147  BP: 133/75 127/69  Pulse: 63 63  Resp: 18 20  Temp: 97.6 F (36.4 C) (!) 97.4 F (36.3 C)  SpO2: 95% 92%   General exam: Elderly chronically ill male sitting up in bed, AAOx3 HEENT: No JVD CVS: S1-S2, regular rhythm Lungs: Few scattered rhonchi and rales Abdomen: Soft, nontender, bowel sounds present Extremities: No edema  Skin: No rashes Psychiatry:  Mood & affect appropriate.   Discharge Instructions  Discharge Instructions     Diet - low sodium heart healthy   Complete by: As directed    Discharge  instructions   Complete by: As directed    Please take prednisone  60 mg daily until 1/17.  Then for 2-week started on 1/17 take 40 mg daily.  After 2 weeks decrease to 20 mg daily of prednisone  and continue that dose until you follow-up with Dr. Geronimo for further instructions.  While taking prednisone  doses of 20 mg or higher, please take dapsone  1 tablet once a day.  This is to prevent a possible fungal infection that comes with high-dose steroids and in combination with the rituximab  decreasing your immune system.   Increase activity slowly   Complete by: As directed       Allergies as of 01/08/2023       Reactions   Farxiga  [dapagliflozin ] Other (See Comments)   Lightheadedness Low blood pressure   Statins Other (See Comments)   Myalgias         Medication List     TAKE these medications    ALPRAZolam  0.5 MG tablet Commonly known as: XANAX  Take 0.5 mg by mouth at bedtime. Takes at 11:00pm   aspirin  81 MG chewable tablet Chew 81 mg by mouth daily.   CALCIUM  600/VITAMIN D3 PO Take 1 tablet by mouth daily.   carvedilol  25 MG tablet Commonly known as: COREG  Take 0.5 tablets (12.5 mg total) by mouth 2 (two) times daily. What changed: additional instructions   Centrum Silver 50+Men Tabs Take 1 tablet by mouth daily.   COQ10 PO Take 400 mg by mouth daily.   dapsone  100 MG tablet Take 1 tablet (100 mg total) by mouth daily.   dofetilide  500 MCG capsule Commonly known as: TIKOSYN  TAKE 1 CAPSULE BY MOUTH 2 TIMES DAILY.   ferrous sulfate  325 (65 FE) MG EC tablet Take 325 mg by mouth daily with breakfast.   furosemide  40 MG tablet Commonly known as: Lasix  Take 1 tablet (40 mg total) by mouth daily. What changed:  when to take this reasons to take this   levothyroxine  137 MCG tablet Commonly known as: SYNTHROID  Take 137 mcg by mouth daily before breakfast.   loratadine  10 MG tablet Commonly known as: CLARITIN  Take 10 mg by mouth daily.   metFORMIN  500  MG tablet Commonly known as: GLUCOPHAGE  Take 500 mg by mouth 2 (two) times daily.   nitroGLYCERIN  0.4 MG SL tablet Commonly known as: NITROSTAT  Place 1 tablet (0.4 mg total) under the tongue every 5 (five) minutes x 3 doses as needed for chest pain.   OZEMPIC (0.25 OR 0.5 MG/DOSE) [facility] Inject 0.5 mg into the skin once a week.   pantoprazole  40 MG tablet Commonly known as: PROTONIX  Take 40 mg by mouth daily.   potassium chloride  SA 20 MEQ tablet Commonly known as: KLOR-CON  M Take 1 tablet (20 mEq total) by mouth daily. What changed: when to take this   predniSONE  20 MG tablet Commonly known as: DELTASONE  Take 60 mg (3 tabs) daily for 2 weeks, cut down to 40 mg (2 tabs) daily from 1/17 What changed:  medication strength how much to take how to take this when to take this additional instructions   Repatha  SureClick 140 MG/ML Soaj Generic drug: Evolocumab  INJECT 140 MG INTO THE SKIN EVERY 14 (FOURTEEN) DAYS. INJECT 1 PEN INTO THE FATTY TISSUE SKIN EVERY 14 DAY   RITUXAN  IV Inject 1 application  into the vein See admin instructions.  Take every two weeks and every six months alternating   testosterone  cypionate 200 MG/ML injection Commonly known as: DEPOTESTOSTERONE CYPIONATE Inject 150 mg into the muscle every 14 (fourteen) days.   venlafaxine  XR 150 MG 24 hr capsule Commonly known as: EFFEXOR -XR Take 150 mg by mouth daily.   Vitamin D-3 125 MCG (5000 UT) Tabs Take 10,000 Units by mouth daily.   warfarin 5 MG tablet Commonly known as: COUMADIN  Take as directed. If you are unsure how to take this medication, talk to your nurse or doctor. Original instructions: TAKE 1/2 TABLET TO 1 TABLET BY MOUTH DAILY AS DIRECTED BY COUMADIN  CLINIC What changed: See the new instructions.               Durable Medical Equipment  (From admission, onward)           Start     Ordered   01/08/23 1132  For home use only DME oxygen   Once       Comments: 3 L at rest and 6 L  with activity  Question Answer Comment  Length of Need 6 Months   Mode or (Route) Nasal cannula   Liters per Minute 6   Frequency Continuous (stationary and portable oxygen  unit needed)   Oxygen  conserving device Yes   Oxygen  delivery system Gas      01/08/23 1131           Allergies  Allergen Reactions   Farxiga  [Dapagliflozin ] Other (See Comments)    Lightheadedness Low blood pressure   Statins Other (See Comments)    Myalgias     Follow-up Information     [facility] Follow up.   Specialty: Home Health Services Why: Someone from [facility] will contact you to arrange start date and time for your home health physical therapy. Contact information: [address] [place] [address] [phone]         Valentin Skates, DO Follow up.   Specialty: Internal Medicine Why: Please follow up in a week. Contact information: [address] [place] [address] [phone]                  The results of significant diagnostics from this hospitalization (including imaging, microbiology, ancillary and laboratory) are listed below for reference.    Significant Diagnostic Studies: CT Chest High Resolution Result Date: 01/07/2023 CLINICAL DATA:  Inpatient. Diffuse/interstitial lung disease ild flare EXAM: CT CHEST WITHOUT CONTRAST TECHNIQUE: Multidetector CT imaging of the chest was performed following the standard protocol without intravenous contrast. High resolution imaging of the lungs, as well as inspiratory and expiratory imaging, was performed. RADIATION DOSE REDUCTION: This exam was performed according to the departmental dose-optimization program which includes automated exposure control, adjustment of the mA and/or kV according to patient size and/or use of iterative reconstruction technique. COMPARISON:  01/05/2023 chest radiograph. 11/05/2022 high-resolution chest CT. FINDINGS: Cardiovascular: Stable mild cardiomegaly. No  significant pericardial effusion/thickening. Three-vessel coronary atherosclerosis status post CABG. Atherosclerotic nonaneurysmal thoracic aorta. Normal caliber pulmonary arteries. Mediastinum/Nodes: No significant thyroid  nodules. Unremarkable esophagus. No axillary adenopathy. Mild right paratracheal adenopathy up to 1.2 cm (series 5/image 45), unchanged. No new pathologically enlarged mediastinal nodes. No discrete hilar adenopathy on these noncontrast images. Lungs/Pleura: No pneumothorax. No pleural effusion. No acute consolidative airspace disease, lung masses or significant pulmonary nodules. Stable mild patchy air trapping in both lungs on the expiration sequence, without evidence of tracheobronchomalacia. Extensive patchy peribronchovascular and subpleural reticulation and ground-glass opacity throughout  both lungs with associated mild traction bronchiectasis and architectural distortion. No clear apicobasilar gradient to these findings. Scattered foci of mild honeycombing in both lungs, for example in the peripheral right lung on series 8/image 80 and in the peripheral lower left lung on series 8/image 98). Ground-glass opacities have worsened since 11/05/2022 chest CT. Findings are otherwise not appreciably changed. Upper abdomen: Cholelithiasis. Musculoskeletal: No aggressive appearing focal osseous lesions. Intact sternotomy wires. Moderate thoracic spondylosis. IMPRESSION: 1. Spectrum of findings compatible with fibrotic interstitial lung disease with mild honeycombing, with worsened diffuse patchy ground-glass opacity in the interval since 11/05/2022 CT. Acute flare of chronic fibrotic interstitial lung disease favored. As previously described, chronic hypersensitivity pneumonitis or fibrotic NSIP are favored. Findings are suggestive of an alternative diagnosis (not UIP) per consensus guidelines: Diagnosis of Idiopathic Pulmonary Fibrosis: An Official ATS/ERS/JRS/ALAT Clinical Practice Guideline. Am  JINNY Honey Crit Care Med Vol 198, Iss 5, [phone], Sep 04 2016. 2. Stable mild cardiomegaly. 3. Cholelithiasis. 4.  Aortic Atherosclerosis (ICD10-I70.0). Electronically Signed   By: Selinda DELENA Blue M.D.   On: 01/07/2023 10:28   CARDIAC CATHETERIZATION Result Date: 01/06/2023 1. Normal filling pressures. 2. Mild pulmonary hypertension without elevated PVR. 3. Normal cardiac output.   VAS US  LOWER EXTREMITY VENOUS (DVT) Result Date: 01/05/2023  Lower Venous DVT Study Patient Name:  SERGIO ZAWISLAK [facility]  Date of Exam:   01/05/2023 Medical Rec #: [id number]          Accession #:    [id number] Date of Birth: 02-20-1945           Patient Gender: M Patient Age:   79 years Exam Location:  [facility] Procedure:      VAS US  LOWER EXTREMITY VENOUS (DVT) Referring Phys: TORIBIO SHARPS --------------------------------------------------------------------------------  Indications: Edema.  Anticoagulation: Coumadin . Limitations: Poor ultrasound/tissue interface. Performing Technologist: Ezzie Potters RVT, RDMS  Examination Guidelines: A complete evaluation includes B-mode imaging, spectral Doppler, color Doppler, and power Doppler as needed of all accessible portions of each vessel. Bilateral testing is considered an integral part of a complete examination. Limited examinations for reoccurring indications may be performed as noted. The reflux portion of the exam is performed with the patient in reverse Trendelenburg.  +--------+---------------+---------+-----------+----------+--------------------+ RIGHT   CompressibilityPhasicitySpontaneityPropertiesThrombus Aging       +--------+---------------+---------+-----------+----------+--------------------+ CFV     Full           Yes      Yes                                       +--------+---------------+---------+-----------+----------+--------------------+ SFJ     Full                                                               +--------+---------------+---------+-----------+----------+--------------------+ FV Prox Full           Yes      Yes                                       +--------+---------------+---------+-----------+----------+--------------------+ FV Mid  Full           Yes  Yes                                       +--------+---------------+---------+-----------+----------+--------------------+ FV                     Yes      Yes                  patent by            Distal                                               color/doppler        +--------+---------------+---------+-----------+----------+--------------------+ PFV     Full                                                              +--------+---------------+---------+-----------+----------+--------------------+ POP     Full           Yes      Yes                                       +--------+---------------+---------+-----------+----------+--------------------+ PTV     Full                                                              +--------+---------------+---------+-----------+----------+--------------------+ PERO    Full                                                              +--------+---------------+---------+-----------+----------+--------------------+   +--------+---------------+---------+-----------+----------+--------------------+ LEFT    CompressibilityPhasicitySpontaneityPropertiesThrombus Aging       +--------+---------------+---------+-----------+----------+--------------------+ CFV     Full           Yes      Yes                                       +--------+---------------+---------+-----------+----------+--------------------+ SFJ     Full                                                              +--------+---------------+---------+-----------+----------+--------------------+ FV Prox Full           Yes      Yes                                        +--------+---------------+---------+-----------+----------+--------------------+  FV Mid  Full           Yes      Yes                                       +--------+---------------+---------+-----------+----------+--------------------+ FV                     Yes      Yes                  patent by            Distal                                               color/doppler        +--------+---------------+---------+-----------+----------+--------------------+ PFV     Full                                                              +--------+---------------+---------+-----------+----------+--------------------+ POP     Full           Yes      Yes                                       +--------+---------------+---------+-----------+----------+--------------------+ PTV     Full                                                              +--------+---------------+---------+-----------+----------+--------------------+ PERO    Full                                                              +--------+---------------+---------+-----------+----------+--------------------+     Summary: BILATERAL: - No evidence of deep vein thrombosis seen in the lower extremities, bilaterally. -No evidence of popliteal cyst, bilaterally.   *See table(s) above for measurements and observations. Electronically signed by Penne Colorado MD on 01/05/2023 at 5:34:57 PM.    Final    DG Chest Port 1 View Result Date: 01/05/2023 CLINICAL DATA:  Acute respiratory failure. History of hypertension and coronary artery disease. EXAM: PORTABLE CHEST 1 VIEW COMPARISON:  01/01/2023 FINDINGS: Previous median sternotomy and CABG procedure. Lung volumes are low. Diffuse increased reticular and nodular interstitial opacities within both lungs compatible with previously characterized chronic fibrotic no superimposed pleural effusion, interstitial edema or airspace consolidation. Interstitial lung disease  IMPRESSION: 1. No acute findings. 2. Chronic interstitial lung disease. Electronically Signed   By: Waddell Calk M.D.   On: 01/05/2023 12:24   DG Chest Port 1 View Result Date: 01/01/2023 CLINICAL DATA:  Shortness of breath.  EXAM: PORTABLE CHEST 1 VIEW COMPARISON:  09/17/2022. FINDINGS: Low lung volume. There are diffuse increased interstitial markings, less pronounced than the prior radiograph from 10/04/2022, which corresponds to chronic interstitial lung disease on recent chest CT scan from 11/05/2022. Bilateral lung fields are otherwise clear. No acute consolidation or lung collapse. Bilateral costophrenic angles are clear. Stable cardio-mediastinal silhouette. Sternotomy wires noted, status post CABG. No acute osseous abnormalities. The soft tissues are within normal limits. IMPRESSION: *No acute cardiopulmonary abnormality.  Interstitial lung disease. Electronically Signed   By: Ree Molt M.D.   On: 01/01/2023 17:19    Microbiology: Recent Results (from the past 240 hours)  Resp panel by RT-PCR (RSV, Flu A&B, Covid) Anterior Nasal Swab     Status: None   Collection Time: 01/01/23  3:41 PM   Specimen: Anterior Nasal Swab  Result Value Ref Range Status   SARS Coronavirus 2 by RT PCR NEGATIVE NEGATIVE Final    Comment: (NOTE) SARS-CoV-2 target nucleic acids are NOT DETECTED.  The SARS-CoV-2 RNA is generally detectable in upper respiratory specimens during the acute phase of infection. The lowest concentration of SARS-CoV-2 viral copies this assay can detect is 138 copies/mL. A negative result does not preclude SARS-Cov-2 infection and should not be used as the sole basis for treatment or other patient management decisions. A negative result may occur with  improper specimen collection/handling, submission of specimen other than nasopharyngeal swab, presence of viral mutation(s) within the areas targeted by this assay, and inadequate number of viral copies(<138 copies/mL). A  negative result must be combined with clinical observations, patient history, and epidemiological information. The expected result is Negative.  Fact Sheet for Patients:  bloggercourse.com  Fact Sheet for Healthcare Providers:  seriousbroker.it  This test is no t yet approved or cleared by the [place]  FDA and  has been authorized for detection and/or diagnosis of SARS-CoV-2 by FDA under an Emergency Use Authorization (EUA). This EUA will remain  in effect (meaning this test can be used) for the duration of the COVID-19 declaration under Section 564(b)(1) of the Act, 21 U.S.C.section 360bbb-3(b)(1), unless the authorization is terminated  or revoked sooner.       Influenza A by PCR NEGATIVE NEGATIVE Final   Influenza B by PCR NEGATIVE NEGATIVE Final    Comment: (NOTE) The Xpert Xpress SARS-CoV-2/FLU/RSV plus assay is intended as an aid in the diagnosis of influenza from Nasopharyngeal swab specimens and should not be used as a sole basis for treatment. Nasal washings and aspirates are unacceptable for Xpert Xpress SARS-CoV-2/FLU/RSV testing.  Fact Sheet for Patients: bloggercourse.com  Fact Sheet for Healthcare Providers: seriousbroker.it  This test is not yet approved or cleared by the [place]  FDA and has been authorized for detection and/or diagnosis of SARS-CoV-2 by FDA under an Emergency Use Authorization (EUA). This EUA will remain in effect (meaning this test can be used) for the duration of the COVID-19 declaration under Section 564(b)(1) of the Act, 21 U.S.C. section 360bbb-3(b)(1), unless the authorization is terminated or revoked.     Resp Syncytial Virus by PCR NEGATIVE NEGATIVE Final    Comment: (NOTE) Fact Sheet for Patients: bloggercourse.com  Fact Sheet for Healthcare  Providers: seriousbroker.it  This test is not yet approved or cleared by the [place]  FDA and has been authorized for detection and/or diagnosis of SARS-CoV-2 by FDA under an Emergency Use Authorization (EUA). This EUA will remain in effect (meaning this test can be used) for the duration of the COVID-19 declaration  under Section 564(b)(1) of the Act, 21 U.S.C. section 360bbb-3(b)(1), unless the authorization is terminated or revoked.  Performed at [facility], [address] [place], [place] [address]      Labs: Basic Metabolic Panel: Recent Labs  Lab 01/03/23 0245 01/04/23 0255 01/04/23 0855 01/05/23 0303 01/06/23 0250 01/07/23 0248 01/08/23 0220  NA 140 139 141  141 141 141 141 140  K 4.2 3.6 3.8  3.8 4.5 3.7 4.0 3.8  CL 106 102  --  104 103 103 101  CO2 25 29  --  27 29 31 29   GLUCOSE 147* 202*  --  194* 106* 108* 134*  BUN 22 33*  --  40* 37* 29* 39*  CREATININE 0.89 0.99  --  0.87 0.76 0.88 1.04  CALCIUM  9.0 9.0  --  9.2 9.0 8.8* 8.8*  MG 2.0 2.3  --  2.4 2.4 2.4 2.4  PHOS 3.2  --   --   --   --   --   --    Liver Function Tests: Recent Labs  Lab 01/03/23 0245  AST 22  ALT 16  ALKPHOS 85  BILITOT 0.7  PROT 5.7*  ALBUMIN  2.8*   No results for input(s): LIPASE, AMYLASE in the last 168 hours. No results for input(s): AMMONIA in the last 168 hours. CBC: Recent Labs  Lab 01/03/23 0245 01/04/23 0255 01/04/23 0855 01/05/23 0303 01/06/23 0250 01/07/23 0248 01/08/23 0220  WBC 17.3* 13.6*  --  15.2* 16.8* 11.7* 12.8*  NEUTROABS 16.0*  --   --   --   --   --   --   HGB 9.3* 9.4* 10.2*  9.9* 9.6* 9.6* 9.4* 9.3*  HCT 30.9* 31.8* 30.0*  29.0* 32.3* 31.8* 31.3* 30.9*  MCV 80.1 80.5  --  80.1 79.3* 80.3 79.0*  PLT 432* 483*  --  512* 553* 552* 535*   Cardiac Enzymes: No results for input(s): CKTOTAL, CKMB, CKMBINDEX, TROPONINI in the last 168 hours. BNP: BNP (last 3 results) Recent Labs     09/17/22 1448 01/01/23 1541 01/04/23 0255  BNP 322.1* 513.8* 255.9*    ProBNP (last 3 results) Recent Labs    11/04/22 1519  PROBNP 416.0*    CBG: Recent Labs  Lab 01/07/23 0556 01/07/23 1201 01/07/23 1623 01/07/23 2242 01/08/23 0626  GLUCAP 86 183* 181* 139* 87       Signed:  Sigurd Pac MD.  [facility] 01/08/2023, 11:59 AM

## 2023-01-07 NOTE — Progress Notes (Signed)
 Mobility Specialist Progress Note: Nurse requested Mobility Specialist to perform oxygen  saturation test with pt which includes removing pt from oxygen  both at rest and while ambulating.  Below are the results from that testing.     Patient Saturations on Room Air at Rest = spO2 84% O2 flow increased to 3L/min, SPO2 92%  Patient Saturations on 3 Liters of oxygen  while Ambulating = sp02 80%. O2 flow increased to 8L/min to get SPO2 92% and WLF.  At end of testing pt left in room on 3  Liters of oxygen . SPO2 was 91% at EOS.  Reported results to nurse.    Thersia Minder Mobility Specialist  Please contact vis Secure Chat or  Rehab Office [phone]

## 2023-01-07 NOTE — Consult Note (Signed)
 [facility] Liaison Consult Note    01/07/2023  Gary [name] 1945-08-21 [id number]  Value-Based Care Institute Patient: Active with [facility] RN CC  Patient was review for patient admitted to at [facility] and met patient and family at the bedside.  Patient endorses PCP and ongoing follow up for care coordination.  Primary Care Provider:  Valentin Skates, DO with [facility], this provider is listed to provide the community transition of care follow up. Patient was discussed in morning progression meeting regarding oxygen  needs for home. Patient is showing an extreme high risk score for unplanned readmission risk.  Insurance: Medicare ACO Reach  Patient is currently active with [facility] for care coordination services.  Patient has been engaged by a [employer]..  The community based plan of care has focused on disease management and community resource support.    Patient will receive a post hospital call and will be evaluated for assessments and disease process education.    Plan: Continue to follow for any additional community care coordination needs for post hospital/community needs. Patient currently has an appointment with [facility] RN CC on 01/13/23.  Of note, [facility] services does not replace or interfere with any services that are needed or arranged by inpatient [facility] care management team.   Richerd Fish, RN, BSN, CCM [place]  [facility], [facility] Liaison Direct Dial: [phone] or secure chat Email: [email or web address]

## 2023-01-07 NOTE — Care Management Important Message (Signed)
 Important Message  Patient Details  Name: Gary [name] MRN: [id number] Date of Birth: 09/25/1945   Important Message Given:  Yes - Medicare IM     Renie Ora 01/07/2023, 10:31 AM

## 2023-01-07 NOTE — Progress Notes (Signed)
 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  Improving on steroids.  Near baseline oxygen  requirement.  Feeling better.  Objective: BP 116/67 (BP Location: Left Arm)   Pulse 69   Temp (!) 97.4 F (36.3 C) (Oral)   Resp 20   Ht 5' 10 (1.778 m)   Wt 94.6 kg   SpO2 94%   BMI 29.92 kg/m  General: Sitting up on side of bed, no acute distress Eyes: EOMI, no icterus Neck: Supple, no JVP appreciate Pulmonary: Mild crackles in the upper lobes or upper lung fields bilaterally, otherwise clear, normal work of breathing on oxygen  Cardiovascular: No edema, warm Abdomen: Nondistended, bowel sounds present MSK: No synovitis, no joint effusion Neuro: Moves all EXTR with ease equally, no weakness, sensation appears intact Psych: Normal mood, full affect  Labs and images reviewed  Assessment and plan:  Acute on chronic hypoxemic respiratory failure: With presumed flare of GPA. At O2 baseline it appears. Improved with steroids.  --Prednisone  60 mg daily for 2 weeks then 40 mg daily for 2 weeks, then remain on 20 mg until pulmonary follow-up with Dr. Geronimo --Dapsone  1 tablet daily for PJP prophylaxis, bactrim  contraindicated with tikosyn   PCCM will sign off

## 2023-01-07 NOTE — TOC Transition Note (Addendum)
 Transition of Care [facility]) - Discharge Note   Patient Details  Name: Gary [name] MRN: [id number] Date of Birth: 16-Apr-1945  Transition of Care [facility]) CM/SW Contact:  Waddell Barnie Rama, RN Phone Number: 01/07/2023, 11:49 AM   Clinical Narrative:    For dc today, NCM notified Kelly with [facility].  Mobility tech walking patient to check oxygen  sats.  He has home oxygen  with [employer], checking to see if oxygen  needs to be increased.  He will have HHRN, HHPT, HHOT. Patient will need increased oxygen  level. NCM spoke with Jon Manners with [employer], she is on her way to see what they can do for his oxygen  needs.  The portable only goes to 5 liters. Per MD patient will not be dc today  Jon Manners with [employer] drop off the oxygen  that goes up to 6 liters in patient room.      Barriers to Discharge: Continued Medical Work up   Patient Goals and CMS Choice Patient states their goals for this hospitalization and ongoing recovery are:: return home   Choice offered to / list presented to : Patient      Discharge Placement                       Discharge Plan and Services Additional resources added to the After Visit Summary for   In-house Referral: NA Discharge Planning Services: CM Consult Post Acute Care Choice: Home Health          DME Arranged: N/A DME Agency: NA       HH Arranged: PT,OT,RN HH Agency: [facility] Date HH Agency Contacted: 01/06/23 Time HH Agency Contacted: 1024 Representative spoke with at [facility] Agency: [place]  Social Drivers of Health (SDOH) Interventions SDOH Screenings   Food Insecurity: No Food Insecurity (01/02/2023)  Housing: Low Risk  (01/02/2023)  Transportation Needs: No Transportation Needs (01/02/2023)  Utilities: Not At Risk (01/02/2023)  Social Connections: Unknown (01/04/2023)  Tobacco Use: Medium Risk (01/02/2023)     Readmission Risk Interventions    03/02/2022    4:16 PM  Readmission Risk Prevention Plan   Transportation Screening Complete  HRI or Home Care Consult Complete  Palliative Care Screening Not Applicable  Medication Review (RN Care Manager) Complete

## 2023-01-07 NOTE — Telephone Encounter (Signed)
 Patient scheduled 02/28/2023

## 2023-01-07 NOTE — Progress Notes (Addendum)
 AVS printed and review all questions answered  picked up TOC meds from pharmacy and delivered bedside.   Left on floor for floor nurse to continue care

## 2023-01-07 NOTE — Progress Notes (Signed)
 PHARMACY - ANTICOAGULATION CONSULT NOTE  Pharmacy Consult for warfarin Indication: atrial fibrillation  Allergies  Allergen Reactions   Farxiga  [Dapagliflozin ] Other (See Comments)    Lightheadedness Low blood pressure   Statins Other (See Comments)    Myalgias     Patient Measurements: Height: 5' 10 (177.8 cm) Weight: 94.6 kg (208 lb 8.9 oz) IBW/kg (Calculated) : 73 Heparin  Dosing Weight: n/a  Vital Signs: Temp: 97.7 F (36.5 C) (01/03 0543) Temp Source: Oral (01/03 0543) BP: 132/75 (01/03 0543) Pulse Rate: 65 (01/03 0543)  Labs: Recent Labs    01/05/23 0303 01/06/23 0250 01/07/23 0248  HGB 9.6* 9.6* 9.4*  HCT 32.3* 31.8* 31.3*  PLT 512* 553* 552*  LABPROT 20.8* 25.6* 21.8*  INR 1.8* 2.3* 1.9*  CREATININE 0.87 0.76 0.88    Estimated Creatinine Clearance: 78.6 mL/min (by C-G formula based on SCr of 0.88 mg/dL).   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 Pharmacy consulted to manage/dose warfarin.   INR 1.8 today  Goal of Therapy:  INR 2-3 Monitor platelets by anticoagulation protocol: Yes   Plan:  Warfarin 5 mg x1 dose  Daily INR, CBC F/u s/sx bleeding  F/u new DDIs  Thank you Olam Monte, PharmD

## 2023-01-07 NOTE — Progress Notes (Signed)
 Incentive spirometer given to patient. Patient verbalized understanding of use. He declines to use IS currently due to just getting back in bed and being short of breath. Primary RN made aware.

## 2023-01-07 NOTE — Progress Notes (Signed)
 Physical Therapy Treatment Patient Details Name: Gary [name] MRN: [id number] DOB: 16-Jan-1945 Today's Date: 01/07/2023   History of Present Illness Pt is 78 yo male admitted on 01/01/23 with resp failure due to ILD flair.  Pt with hx including but not limited to Wegener's granulomatosis, ILD, former smoker, CAD/CABG, afib, CHF, DM2    PT Comments  Pt politely declining mobility this session due to fatigue from working and mobilizing with various staff this morning already. Pt agreeable to education in preparation for anticipated d/c home soon. Provided handouts and education on breathing exercises and energy conservation techniques. Encouraged use of IS at home. Educated pt on progressing himself through a walking program. Educated pt on ways to alter his home, like add non-slip pads to the tile floors and tile seat of the shower, to improve his safety. Educated pt on healthy eating with a low sodium diet. He verbalized understanding of all education. Will continue to follow acutely.   If plan is discharge home, recommend the following: A little help with bathing/dressing/bathroom;Assistance with cooking/housework;Help with stairs or ramp for entrance   Can travel by private vehicle        Equipment Recommendations  None recommended by PT    Recommendations for Other Services       Precautions / Restrictions Precautions Precautions: Other (comment) Precaution Comments: Watch Sats (orders for 88-92%) Restrictions Weight Bearing Restrictions Per Provider Order: No     Mobility  Bed Mobility               General bed mobility comments: Pt sitting EOB upon arrival and at end of session, pt politely declining mobility and requesting PT limit session to education in preparation for d/c home soon    Transfers                   General transfer comment: pt politely declining mobility and requesting PT limit session to education in preparation for d/c home soon     Ambulation/Gait               General Gait Details: pt politely declining mobility and requesting PT limit session to education in preparation for d/c home soon   Stairs             Wheelchair Mobility     Tilt Bed    Modified Rankin (Stroke Patients Only)       Balance Overall balance assessment: Needs assistance Sitting-balance support: No upper extremity supported, Feet supported Sitting balance-Leahy Scale: Normal                                      Cognition Arousal: Alert Behavior During Therapy: WFL for tasks assessed/performed Overall Cognitive Status: Within Functional Limits for tasks assessed                                 General Comments: pleasant , motivated, talkative and sometimes needs redirecting to education        Exercises      General Comments General comments (skin integrity, edema, etc.): provided handouts and education on breathing exercises and energy conservation techniques; encouraged use of IS at home; educated pt on progressing himself through a walking program; educated pt on ways to alter his home, like add non-slip pads to the tile floors and tile seat of the  shower, to improve his safety; he verbalized understanding of all education      Pertinent Vitals/Pain Pain Assessment Pain Assessment: Faces Faces Pain Scale: No hurt Pain Intervention(s): Monitored during session    Home Living                          Prior Function            PT Goals (current goals can now be found in the care plan section) Acute Rehab PT Goals Patient Stated Goal: ILD back into remission, able to return home this weekend PT Goal Formulation: With patient Time For Goal Achievement: 01/19/23 Potential to Achieve Goals: Good Progress towards PT goals: Progressing toward goals    Frequency    Min 1X/week      PT Plan      Co-evaluation              AM-PAC PT 6 Clicks  Mobility   Outcome Measure  Help needed turning from your back to your side while in a flat bed without using bedrails?: None Help needed moving from lying on your back to sitting on the side of a flat bed without using bedrails?: None Help needed moving to and from a bed to a chair (including a wheelchair)?: A Little Help needed standing up from a chair using your arms (e.g., wheelchair or bedside chair)?: A Little Help needed to walk in hospital room?: A Little Help needed climbing 3-5 steps with a railing? : A Little 6 Click Score: 20    End of Session Equipment Utilized During Treatment: Oxygen  Activity Tolerance: Other (comment) (pt politely declining mobility, wishing to eat his lunch and rest at this time, but agreeable to education) Patient left: in bed;with call bell/phone within reach (left bed as found (unsure if alarm was on or off upon arrival, but did not alter it))   PT Visit Diagnosis: Other abnormalities of gait and mobility (R26.89)     Time: 8764-8743 PT Time Calculation (min) (ACUTE ONLY): 21 min  Charges:    $Therapeutic Activity: 8-22 mins PT General Charges $$ ACUTE PT VISIT: 1 Visit                     Theo Ferretti, PT, DPT Acute Rehabilitation Services  Office: [phone]    Theo CHRISTELLA Ferretti 01/07/2023, 4:35 PM

## 2023-01-07 NOTE — Plan of Care (Signed)

## 2023-01-07 NOTE — Progress Notes (Signed)
 Mobility Specialist Progress Note:   01/07/23 1200  Mobility  Activity Ambulated with assistance in hallway  Level of Assistance Contact guard assist, steadying assist  Assistive Device None  Distance Ambulated (ft) 200 ft  Activity Response Tolerated well  Mobility Referral Yes  Mobility visit 1 Mobility  Mobility Specialist Start Time (ACUTE ONLY) 1145  Mobility Specialist Stop Time (ACUTE ONLY) 1157  Mobility Specialist Time Calculation (min) (ACUTE ONLY) 12 min   Pt received in bed agreeable to mobility. Pt required 3L/min at rest d/t SPO2 being 84% on RA. No physical assistance needed throughout session just a contact guard d/t swaying gait. Attempted to ambulate on 3L/min but d/t desat to 80% O2 flow increased to 8L/min to get Spo2 WFL. Returned to room w/o fault. Left on 3L/min. All needs met.  Thersia Minder Mobility Specialist  Please contact vis Secure Chat or  Rehab Office [phone]

## 2023-01-08 ENCOUNTER — Telehealth: Payer: Self-pay | Admitting: Internal Medicine

## 2023-01-08 ENCOUNTER — Other Ambulatory Visit (HOSPITAL_COMMUNITY): Payer: Self-pay

## 2023-01-08 LAB — CBC
HCT: 30.9 % — ABNORMAL LOW (ref 39.0–52.0)
Hemoglobin: 9.3 g/dL — ABNORMAL LOW (ref 13.0–17.0)
MCH: 23.8 pg — ABNORMAL LOW (ref 26.0–34.0)
MCHC: 30.1 g/dL (ref 30.0–36.0)
MCV: 79 fL — ABNORMAL LOW (ref 80.0–100.0)
Platelets: 535 10*3/uL — ABNORMAL HIGH (ref 150–400)
RBC: 3.91 MIL/uL — ABNORMAL LOW (ref 4.22–5.81)
RDW: 17.7 % — ABNORMAL HIGH (ref 11.5–15.5)
WBC: 12.8 10*3/uL — ABNORMAL HIGH (ref 4.0–10.5)
nRBC: 0.2 % (ref 0.0–0.2)

## 2023-01-08 LAB — BASIC METABOLIC PANEL
Anion gap: 10 (ref 5–15)
BUN: 39 mg/dL — ABNORMAL HIGH (ref 8–23)
CO2: 29 mmol/L (ref 22–32)
Calcium: 8.8 mg/dL — ABNORMAL LOW (ref 8.9–10.3)
Chloride: 101 mmol/L (ref 98–111)
Creatinine, Ser: 1.04 mg/dL (ref 0.61–1.24)
GFR, Estimated: >60 mL/min (ref >60)
Glucose, Bld: 134 mg/dL — ABNORMAL HIGH (ref 70–99)
Potassium: 3.8 mmol/L (ref 3.5–5.1)
Sodium: 140 mmol/L (ref 135–145)

## 2023-01-08 LAB — GLUCOSE 6 PHOSPHATE DEHYDROGENASE
G6PDH: 16.6 U/g{Hb} — ABNORMAL HIGH (ref 4.8–15.7)
Hemoglobin: 9.3 g/dL — ABNORMAL LOW (ref 13.0–17.7)

## 2023-01-08 LAB — GLUCOSE, CAPILLARY
Glucose-Capillary: 87 mg/dL (ref 70–99)
Glucose-Capillary: 162 mg/dL — ABNORMAL HIGH (ref 70–99)

## 2023-01-08 LAB — MAGNESIUM: Magnesium: 2.4 mg/dL (ref 1.7–2.4)

## 2023-01-08 LAB — PROTIME-INR
INR: 1.8 — ABNORMAL HIGH (ref 0.8–1.2)
Prothrombin Time: 21 s — ABNORMAL HIGH (ref 11.4–15.2)

## 2023-01-08 MED ORDER — WARFARIN SODIUM 5 MG PO TABS: 5.0000 mg | ORAL_TABLET | Freq: Once | ORAL | Status: DC

## 2023-01-08 NOTE — Telephone Encounter (Signed)
 Gary [name]  You are seeing Gary [name] soon. Gary [name] has wegners and flared with steroid taper. Gary [name] feels steroid taper + rituxan  is not enough (6 moths intervals). I ordered a G6PD an dthis is normal  Plan  - ensure devki waorking on esbriet   - ok to start bactrim  MWF - I ordered a B cell flow cytometry - try to get this result - > if b cell re-emergence means Gary [name] needs more frequent Rituxan  - to be communicated to [facility] through me versus you      SIGNATURE    Dr. Dorethia Cave, M.D., F.C.C.P,  Pulmonary and Critical Care Medicine Staff Physician, [facility] Director - Interstitial Lung Disease  Program  Pulmonary Fibrosis [facility] at [facility] [place], [place], [address]   Pager: [phone], If no answer  -> Check AMION or Try [phone] Telephone (clinical office): [phone] Telephone (research): [phone]  7:03 PM 01/08/2023

## 2023-01-08 NOTE — Progress Notes (Signed)
 PHARMACY - ANTICOAGULATION CONSULT NOTE  Pharmacy Consult for warfarin Indication: atrial fibrillation  Allergies  Allergen Reactions   Farxiga  [Dapagliflozin ] Other (See Comments)    Lightheadedness Low blood pressure   Statins Other (See Comments)    Myalgias     Patient Measurements: Height: 5' 10 (177.8 cm) Weight: 94.6 kg (208 lb 8.9 oz) IBW/kg (Calculated) : 73 Heparin  Dosing Weight: n/a  Vital Signs: Temp: 97.6 F (36.4 C) (01/04 0738) Temp Source: Oral (01/04 0738) BP: 133/75 (01/04 0738) Pulse Rate: 63 (01/04 0738)  Labs: Recent Labs    01/06/23 0250 01/07/23 0248 01/08/23 0220  HGB 9.6* 9.4* 9.3*  HCT 31.8* 31.3* 30.9*  PLT 553* 552* 535*  LABPROT 25.6* 21.8* 21.0*  INR 2.3* 1.9* 1.8*  CREATININE 0.76 0.88 1.04    Estimated Creatinine Clearance: 66.5 mL/min (by C-G formula based on SCr of 1.04 mg/dL).   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 Pharmacy consulted to manage/dose warfarin. On 12/29, patient was scheduled to receive warfarin 5 mg PO x 1 dose. It is unclear if the patient received that dose, as there is no documentation on the [facility] but the patient did tell the RN that he received the 5 mg dose on 12/29 at the [facility] ED prior to transferring to [facility]. In light of this information, will dose according to PTA regimen so as not to overshoot therapeutic range.  Last dose prior to  admission: 12/28 @ 0800  PTA regimen: 2.5mg  on MWF, 5mg  on all other days  INR this AM slightly subtherapeutic at 1.8. CBC ok - Hgb 9s, PLTc wnl. Will continue home dosing schedule.   Goal of Therapy:  INR 2-3 Monitor platelets by anticoagulation protocol: Yes   Plan:  Warfarin 5 mg x1 dose  Daily INR, CBC F/u s/sx bleeding  F/u new DDIs  Marget Hench, PharmD PGY1 Pharmacy Resident

## 2023-01-08 NOTE — Progress Notes (Signed)
 Mobility Specialist Progress Note:  Nurse requested Mobility Specialist to perform oxygen  saturation test with pt which includes removing pt from oxygen  both at rest and while ambulating.  Below are the results from that testing.     Patient Saturations on Room Air at Rest = spO2 85% O2 flow increased to 3L/min to get Spo2 to 91%  Patient Saturations on 6 Liters of oxygen  while Ambulating = sp02 93%  At end of testing pt left in room on 3  Liters of oxygen .  Reported results to nurse.    Thersia Minder Mobility Specialist  Please contact vis Secure Chat or  Rehab Office [phone]

## 2023-01-08 NOTE — TOC Transition Note (Signed)
 Transition of Care [facility]) - Discharge Note   Patient Details  Name: Gary [name] MRN: [id number] Date of Birth: 19-May-1945  Transition of Care [facility]) CM/SW Contact:  Marval Gell, RN Phone Number: 01/08/2023, 12:04 PM   Clinical Narrative:     Janese Can w [facility] agency that patient will DC today. Per chart review [facility] has delivered oxygen  to room.    Final next level of care: Home w Home Health Services Barriers to Discharge: No Barriers Identified   Patient Goals and CMS Choice Patient states their goals for this hospitalization and ongoing recovery are:: return home   Choice offered to / list presented to : Patient      Discharge Placement                       Discharge Plan and Services Additional resources added to the After Visit Summary for   In-house Referral: NA Discharge Planning Services: CM Consult Post Acute Care Choice: Home Health          DME Arranged: N/A DME Agency: NA       HH Arranged: PT HH Agency: [facility] Date HH Agency Contacted: 01/08/23 Time HH Agency Contacted: 1204 Representative spoke with at [facility] Agency: Can  Social Drivers of Health (SDOH) Interventions SDOH Screenings   Food Insecurity: No Food Insecurity (01/02/2023)  Housing: Low Risk  (01/02/2023)  Transportation Needs: No Transportation Needs (01/02/2023)  Utilities: Not At Risk (01/02/2023)  Social Connections: Unknown (01/04/2023)  Tobacco Use: Medium Risk (01/02/2023)     Readmission Risk Interventions    03/02/2022    4:16 PM  Readmission Risk Prevention Plan  Transportation Screening Complete  HRI or Home Care Consult Complete  Palliative Care Screening Not Applicable  Medication Review (RN Care Manager) Complete

## 2023-01-08 NOTE — Progress Notes (Signed)
 Mobility Specialist Progress Note:   01/08/23 1035  Mobility  Activity Ambulated with assistance in hallway  Level of Assistance Contact guard assist, steadying assist  Assistive Device None  Distance Ambulated (ft) 100 ft  Activity Response Tolerated well  Mobility Referral Yes  Mobility visit 1 Mobility  Mobility Specialist Start Time (ACUTE ONLY) 0950  Mobility Specialist Stop Time (ACUTE ONLY) 1005  Mobility Specialist Time Calculation (min) (ACUTE ONLY) 15 min   Pt received in bed agreeable to mobility. Pt required 3L/min at rest d/t SPO2 being 85% on RA. Attempted to ambulate on 3L/Min but pt dsat to 84% needing 6L/min to get Spo2 WFL. No c/o throughout. Returned to room w/o fault. Call bell and personal belongings in reach. All needs met. Left on 3L/min. Pre Mobility RA SPO2 85%                     3L/min SPO2 91% During Mobility 6L/min SPO2 93% Post Mobility 3L/min SPO2 91%  Thersia Minder Mobility Specialist  Please contact vis Secure Chat or  Rehab Office [phone]

## 2023-01-10 ENCOUNTER — Encounter (HOSPITAL_COMMUNITY): Admission: RE | Payer: Self-pay | Source: Ambulatory Visit

## 2023-01-10 ENCOUNTER — Ambulatory Visit (HOSPITAL_COMMUNITY): Admission: RE | Admit: 2023-01-10 | Payer: Medicare Other | Source: Ambulatory Visit | Admitting: Cardiology

## 2023-01-10 ENCOUNTER — Other Ambulatory Visit (HOSPITAL_BASED_OUTPATIENT_CLINIC_OR_DEPARTMENT_OTHER): Payer: Self-pay | Admitting: Family

## 2023-01-10 SURGERY — RIGHT HEART CATH
Anesthesia: LOCAL

## 2023-01-11 DIAGNOSIS — Z7985 Long-term (current) use of injectable non-insulin antidiabetic drugs: Secondary | ICD-10-CM | POA: Diagnosis not present

## 2023-01-11 DIAGNOSIS — G4730 Sleep apnea, unspecified: Secondary | ICD-10-CM | POA: Diagnosis not present

## 2023-01-11 DIAGNOSIS — F32A Depression, unspecified: Secondary | ICD-10-CM | POA: Diagnosis not present

## 2023-01-11 DIAGNOSIS — E66811 Obesity, class 1: Secondary | ICD-10-CM | POA: Diagnosis not present

## 2023-01-11 DIAGNOSIS — I480 Paroxysmal atrial fibrillation: Secondary | ICD-10-CM | POA: Diagnosis not present

## 2023-01-11 DIAGNOSIS — Z7984 Long term (current) use of oral hypoglycemic drugs: Secondary | ICD-10-CM | POA: Diagnosis not present

## 2023-01-11 DIAGNOSIS — Z87828 Personal history of other (healed) physical injury and trauma: Secondary | ICD-10-CM | POA: Diagnosis not present

## 2023-01-11 DIAGNOSIS — F411 Generalized anxiety disorder: Secondary | ICD-10-CM | POA: Diagnosis not present

## 2023-01-11 DIAGNOSIS — I110 Hypertensive heart disease with heart failure: Secondary | ICD-10-CM | POA: Diagnosis not present

## 2023-01-11 DIAGNOSIS — Z9981 Dependence on supplemental oxygen: Secondary | ICD-10-CM | POA: Diagnosis not present

## 2023-01-11 DIAGNOSIS — D72829 Elevated white blood cell count, unspecified: Secondary | ICD-10-CM | POA: Diagnosis not present

## 2023-01-11 DIAGNOSIS — M3130 Wegener's granulomatosis without renal involvement: Secondary | ICD-10-CM | POA: Diagnosis not present

## 2023-01-11 DIAGNOSIS — I2510 Atherosclerotic heart disease of native coronary artery without angina pectoris: Secondary | ICD-10-CM | POA: Diagnosis not present

## 2023-01-11 DIAGNOSIS — E785 Hyperlipidemia, unspecified: Secondary | ICD-10-CM | POA: Diagnosis not present

## 2023-01-11 DIAGNOSIS — J309 Allergic rhinitis, unspecified: Secondary | ICD-10-CM | POA: Diagnosis not present

## 2023-01-11 DIAGNOSIS — J849 Interstitial pulmonary disease, unspecified: Secondary | ICD-10-CM | POA: Diagnosis not present

## 2023-01-11 DIAGNOSIS — I7121 Aneurysm of the ascending aorta, without rupture: Secondary | ICD-10-CM | POA: Diagnosis not present

## 2023-01-11 DIAGNOSIS — E039 Hypothyroidism, unspecified: Secondary | ICD-10-CM | POA: Diagnosis not present

## 2023-01-11 DIAGNOSIS — E119 Type 2 diabetes mellitus without complications: Secondary | ICD-10-CM | POA: Diagnosis not present

## 2023-01-11 DIAGNOSIS — J9601 Acute respiratory failure with hypoxia: Secondary | ICD-10-CM | POA: Diagnosis not present

## 2023-01-11 DIAGNOSIS — I5042 Chronic combined systolic (congestive) and diastolic (congestive) heart failure: Secondary | ICD-10-CM | POA: Diagnosis not present

## 2023-01-11 DIAGNOSIS — D509 Iron deficiency anemia, unspecified: Secondary | ICD-10-CM | POA: Diagnosis not present

## 2023-01-11 DIAGNOSIS — Z7982 Long term (current) use of aspirin: Secondary | ICD-10-CM | POA: Diagnosis not present

## 2023-01-11 DIAGNOSIS — Z7901 Long term (current) use of anticoagulants: Secondary | ICD-10-CM | POA: Diagnosis not present

## 2023-01-11 DIAGNOSIS — Z6830 Body mass index (BMI) 30.0-30.9, adult: Secondary | ICD-10-CM | POA: Diagnosis not present

## 2023-01-12 ENCOUNTER — Other Ambulatory Visit (HOSPITAL_BASED_OUTPATIENT_CLINIC_OR_DEPARTMENT_OTHER): Payer: Self-pay | Admitting: Family

## 2023-01-13 ENCOUNTER — Ambulatory Visit: Payer: Self-pay

## 2023-01-14 NOTE — Patient Instructions (Signed)
 Visit Information  Thank you for taking time to visit with me today. Please don't hesitate to contact me if I can be of assistance to you.   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 Review of patient status, including review of consultant's reports, relevant laboratory and other test results, and medications completed Recommendations for Outpatient Follow-up:  Pulmonary Dr. Geronimo in 2 weeks Cut down prednisone  dose from 60-40 Mg daily in 2 weeks Follow-up with Dr. Mai, rheumatology Determined patient feels he is progressing toward wellness and getting stronger with each passing day Determined patient has a good understanding of his prescribed treatment plan  Instructed patient to notify his doctor of new symptoms or concerns promptly Advised patient about nurse care coordination case closure per provider request and encouraged patient to share his experience with his PCP       COMPLETED: To have evaluation for treatment of A-fib       Care Coordination Interventions: Evaluation of current treatment plan related to persistent Atrial Fib  and patient's adherence to plan as established by provider Instructed patient to contact his doctor to report new symptom or concerns promptly Advised patient about nurse care coordination case closure per provider request and encouraged patient to share his experience with his PCP       COMPLETED: To improve lower extremity edema while maintaining a good BP        Care Coordination Interventions: Evaluation of current treatment plan related to hypertension self management and patient's adherence to plan as established by provider Instructed patient to contact his doctor to report new symptom or concerns promptly Advised patient about nurse care coordination case closure per provider request and encouraged patient to share his experience with his PCP         If you are experiencing a Mental Health or Behavioral Health Crisis or need someone to talk to, please call 1-800-273-TALK (toll free, 24 hour hotline)  Patient verbalizes understanding of instructions and care plan provided today and agrees to view in MyChart. Active MyChart status and patient understanding of how to access instructions and care plan via MyChart confirmed with patient.     Clayborne Ly RN BSN CCM [place]  [facility], [facility] Nurse Care Coordinator  Direct Dial: [phone] Website: [email or web address]

## 2023-01-14 NOTE — Patient Outreach (Signed)
  Care Coordination   Follow Up Visit Note   01/13/2023 Name: Gary [name] MRN: [id number] DOB: [birth year]  Gary [name] is a 78 y.o. year old male who sees Valentin Skates, [place] for primary care. I spoke with  Gary [name] Lighter by phone today.  What matters to the patients health and wellness today?  Patient would like to make a full recovery from a recent Wegener's flare.     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 Review of patient status, including review of consultant's reports, relevant laboratory and other test results, and medications completed Recommendations for Outpatient Follow-up:  Pulmonary Dr. Geronimo in 2 weeks Cut down prednisone  dose from 60-40 Mg daily in 2 weeks Follow-up with Dr. Mai, rheumatology Determined patient feels he is progressing toward wellness and getting stronger with each passing day Determined patient has a good understanding of his prescribed treatment plan  Instructed patient to notify his doctor of new symptoms or concerns promptly Advised patient about nurse care coordination case closure per provider request and encouraged patient to share his experience with his PCP       COMPLETED: To have evaluation for treatment of A-fib       Care Coordination Interventions: Evaluation of current treatment plan related to persistent Atrial Fib  and patient's adherence to plan as established by provider Instructed patient to contact his doctor to report new symptom or concerns promptly Advised patient  about nurse care coordination case closure per provider request and encouraged patient to share his experience with his PCP      COMPLETED: To improve lower extremity edema while maintaining a good BP       Care Coordination Interventions: Evaluation of current treatment plan related to hypertension self management and patient's adherence to plan as established by provider Instructed patient to contact his doctor to report new symptom or concerns promptly Advised patient about nurse care coordination case closure per provider request and encouraged patient to share his experience with his PCP     Interventions Today    Flowsheet Row Most Recent Value  Chronic Disease   Chronic disease during today's visit Other  [wegener's,  ILD]  General Interventions   General Interventions Discussed/Reviewed General Interventions Discussed, General Interventions Reviewed, Doctor Visits, Durable Medical Equipment (DME)  Doctor Visits Discussed/Reviewed Doctor Visits Discussed, Doctor Visits Reviewed, Specialist, PCP  Durable Medical Equipment (DME) Oxygen   Education Interventions   Education Provided Provided Education  Provided Verbal Education On Medication, When to see the doctor  Pharmacy Interventions   Pharmacy Dicussed/Reviewed Pharmacy Topics Discussed, Pharmacy Topics Reviewed, Medications and their functions          SDOH assessments and interventions completed:  No   Care Coordination Interventions:  Yes, provided   Follow up plan: No further intervention required.   Encounter Outcome:  Patient Visit Completed

## 2023-01-17 DIAGNOSIS — E119 Type 2 diabetes mellitus without complications: Secondary | ICD-10-CM | POA: Diagnosis not present

## 2023-01-17 DIAGNOSIS — I110 Hypertensive heart disease with heart failure: Secondary | ICD-10-CM | POA: Diagnosis not present

## 2023-01-17 DIAGNOSIS — I5042 Chronic combined systolic (congestive) and diastolic (congestive) heart failure: Secondary | ICD-10-CM | POA: Diagnosis not present

## 2023-01-17 DIAGNOSIS — J849 Interstitial pulmonary disease, unspecified: Secondary | ICD-10-CM | POA: Diagnosis not present

## 2023-01-17 DIAGNOSIS — J9601 Acute respiratory failure with hypoxia: Secondary | ICD-10-CM | POA: Diagnosis not present

## 2023-01-17 DIAGNOSIS — I480 Paroxysmal atrial fibrillation: Secondary | ICD-10-CM | POA: Diagnosis not present

## 2023-01-18 DIAGNOSIS — M3130 Wegener's granulomatosis without renal involvement: Secondary | ICD-10-CM | POA: Diagnosis not present

## 2023-01-18 MED ORDER — PIRFENIDONE 267 MG PO TABS
ORAL_TABLET | ORAL | 0 refills | Status: DC
  Filled 2023-01-19: qty 270, 30d supply, fill #0

## 2023-01-18 NOTE — Telephone Encounter (Signed)
 Patient enrolled into PF grant through PAF: Award Period: 07/22/2022 - 01/18/2024 Cardholder: [id number] BIN: [id number] PCN: [id number] Group: [id number] For pharmacy inquiries, contact PDMI at [phone]. Amount: $4000  Rx sent to [facility]

## 2023-01-18 NOTE — Telephone Encounter (Signed)
 Called patient to speak about PF grant enrollment for Esbriet and perform medication counseling. Patient stated he had just gotten home from an infusion and preferred to speak at a later time.   Sofie Rower, PharmD [facility] PGY-1

## 2023-01-19 ENCOUNTER — Other Ambulatory Visit: Payer: Self-pay | Admitting: Pharmacist

## 2023-01-19 ENCOUNTER — Other Ambulatory Visit: Payer: Self-pay

## 2023-01-19 ENCOUNTER — Other Ambulatory Visit (HOSPITAL_COMMUNITY): Payer: Self-pay

## 2023-01-19 DIAGNOSIS — J849 Interstitial pulmonary disease, unspecified: Secondary | ICD-10-CM

## 2023-01-19 DIAGNOSIS — Z5181 Encounter for therapeutic drug level monitoring: Secondary | ICD-10-CM

## 2023-01-19 DIAGNOSIS — J84112 Idiopathic pulmonary fibrosis: Secondary | ICD-10-CM

## 2023-01-19 NOTE — Telephone Encounter (Addendum)
 Called patient and discussed pirfenidone  with patient  Patient counseled on purpose, proper use, and potential adverse effects including nausea, vomiting, abdominal pain, GERD, weight loss, arthralgia, dizziness, and sun sensitivity/rash.  Stressed the importance of routine lab monitoring. Will monitor LFT's every month for the first 6 months of treatment then every 3 months. Will monitor CBC every 3 months.  Starting dose will be Esbriet  267 mg 1 tablet three times daily for 7 days, then 2 tablets three times daily for 7 days, then 3 tablets three times daily.  Maintenance dose will be 801 mg 1 tablet three times daily if tolerated.  Stressed the importance of taking with meals to minimize stomach upset.     Reviewed patient's current medication list. There is no interaction with current medications and pirfenidone  including cardiac meds  Geraldene Kleine, PharmD, MPH, BCPS, CPP Clinical Pharmacist (Rheumatology and Pulmonology)

## 2023-01-19 NOTE — Progress Notes (Signed)
 Standing order for hepatic function panel placed for pirfenidone  monitoring  Monthly x 6 months then every 3 months  Geraldene Kleine, PharmD, MPH, BCPS, CPP Clinical Pharmacist (Rheumatology and Pulmonology)

## 2023-01-19 NOTE — Progress Notes (Signed)
 Specialty Pharmacy Initial Fill Coordination Note  Gary [name] is a 78 y.o. male contacted today regarding initial fill of specialty medication(s) Pirfenidone    Patient requested Delivery   Delivery date: 01/21/23   Verified address: [address]   [place] [address]   Medication will be filled on 01/20/23.   Patient has grant on file and is aware of $0 copayment.

## 2023-01-20 ENCOUNTER — Other Ambulatory Visit: Payer: Self-pay

## 2023-01-20 NOTE — Telephone Encounter (Signed)
Patient counseled - Shipment of pirfenidone set up to his home  [name], PharmD, MPH, BCPS, CPP Clinical Pharmacist (Rheumatology and Pulmonology)

## 2023-01-22 DIAGNOSIS — I5042 Chronic combined systolic (congestive) and diastolic (congestive) heart failure: Secondary | ICD-10-CM | POA: Diagnosis not present

## 2023-01-22 DIAGNOSIS — E119 Type 2 diabetes mellitus without complications: Secondary | ICD-10-CM | POA: Diagnosis not present

## 2023-01-22 DIAGNOSIS — J9601 Acute respiratory failure with hypoxia: Secondary | ICD-10-CM | POA: Diagnosis not present

## 2023-01-22 DIAGNOSIS — J849 Interstitial pulmonary disease, unspecified: Secondary | ICD-10-CM | POA: Diagnosis not present

## 2023-01-22 DIAGNOSIS — I480 Paroxysmal atrial fibrillation: Secondary | ICD-10-CM | POA: Diagnosis not present

## 2023-01-22 DIAGNOSIS — I110 Hypertensive heart disease with heart failure: Secondary | ICD-10-CM | POA: Diagnosis not present

## 2023-01-22 NOTE — Progress Notes (Signed)
Counseled patient regarding pirfenidone on 01/19/2023 (see phone note)  Dose (267mg  tabs): Take 1 tab three times daily for 7 days, then 2 tabs three times daily for 7 days, then 3 tabs three times daily thereafter.  [name], PharmD, MPH, BCPS, CPP Clinical Pharmacist (Rheumatology and Pulmonology)

## 2023-01-24 ENCOUNTER — Ambulatory Visit: Payer: Medicare Other | Attending: Cardiovascular Disease | Admitting: *Deleted

## 2023-01-24 DIAGNOSIS — D72829 Elevated white blood cell count, unspecified: Secondary | ICD-10-CM | POA: Diagnosis not present

## 2023-01-24 DIAGNOSIS — D649 Anemia, unspecified: Secondary | ICD-10-CM | POA: Diagnosis not present

## 2023-01-24 DIAGNOSIS — Z7901 Long term (current) use of anticoagulants: Secondary | ICD-10-CM | POA: Diagnosis not present

## 2023-01-24 DIAGNOSIS — E1169 Type 2 diabetes mellitus with other specified complication: Secondary | ICD-10-CM | POA: Diagnosis not present

## 2023-01-24 DIAGNOSIS — I4891 Unspecified atrial fibrillation: Secondary | ICD-10-CM | POA: Diagnosis not present

## 2023-01-24 DIAGNOSIS — I5022 Chronic systolic (congestive) heart failure: Secondary | ICD-10-CM | POA: Diagnosis not present

## 2023-01-24 DIAGNOSIS — K921 Melena: Secondary | ICD-10-CM | POA: Diagnosis not present

## 2023-01-24 DIAGNOSIS — D849 Immunodeficiency, unspecified: Secondary | ICD-10-CM | POA: Diagnosis not present

## 2023-01-24 DIAGNOSIS — I480 Paroxysmal atrial fibrillation: Secondary | ICD-10-CM | POA: Diagnosis not present

## 2023-01-24 DIAGNOSIS — D500 Iron deficiency anemia secondary to blood loss (chronic): Secondary | ICD-10-CM | POA: Diagnosis not present

## 2023-01-24 DIAGNOSIS — J9611 Chronic respiratory failure with hypoxia: Secondary | ICD-10-CM | POA: Diagnosis not present

## 2023-01-24 DIAGNOSIS — J9601 Acute respiratory failure with hypoxia: Secondary | ICD-10-CM | POA: Diagnosis not present

## 2023-01-24 DIAGNOSIS — M3130 Wegener's granulomatosis without renal involvement: Secondary | ICD-10-CM | POA: Diagnosis not present

## 2023-01-24 DIAGNOSIS — I719 Aortic aneurysm of unspecified site, without rupture: Secondary | ICD-10-CM | POA: Diagnosis not present

## 2023-01-24 DIAGNOSIS — J849 Interstitial pulmonary disease, unspecified: Secondary | ICD-10-CM | POA: Diagnosis not present

## 2023-01-24 LAB — POCT INR: INR: 6.7 — AB (ref 2.0–3.0)

## 2023-01-24 NOTE — Patient Instructions (Addendum)
Description   Do not take any warfarin until we call you with STAT INR results.  Spoke with pt and advised do not take any warfarin tomorrow (already taken today's dose), do not take any warfarin Wednesday, and 1/2 tablet of warfarin on Thursday, 1/2 tablet of warfarin Friday, 1 tablet on Saturday, 1/2 tablet on Sunday, 1/2 tablet in Monday, then recheck INR on Tuesday. Report to ER with any bleeding, falls, accidents, incidents. Have leafy vegetables today and tomorrow.  Currently prednisone 40mg  causes INR to increase.   Normal Dose: 1 tablet Daily, except 0.5 tablet on Monday, Wednesday and Friday.  Please call us at [phone] with any questions.

## 2023-01-26 DIAGNOSIS — I480 Paroxysmal atrial fibrillation: Secondary | ICD-10-CM | POA: Diagnosis not present

## 2023-01-26 DIAGNOSIS — I5042 Chronic combined systolic (congestive) and diastolic (congestive) heart failure: Secondary | ICD-10-CM | POA: Diagnosis not present

## 2023-01-26 DIAGNOSIS — J849 Interstitial pulmonary disease, unspecified: Secondary | ICD-10-CM | POA: Diagnosis not present

## 2023-01-26 DIAGNOSIS — J9601 Acute respiratory failure with hypoxia: Secondary | ICD-10-CM | POA: Diagnosis not present

## 2023-01-26 DIAGNOSIS — I110 Hypertensive heart disease with heart failure: Secondary | ICD-10-CM | POA: Diagnosis not present

## 2023-01-26 DIAGNOSIS — E119 Type 2 diabetes mellitus without complications: Secondary | ICD-10-CM | POA: Diagnosis not present

## 2023-01-26 LAB — PROTIME-INR
INR: 5.8 (ref 0.9–1.2)
Prothrombin Time: 56.9 s — ABNORMAL HIGH (ref 9.1–12.0)

## 2023-01-27 DIAGNOSIS — E119 Type 2 diabetes mellitus without complications: Secondary | ICD-10-CM | POA: Diagnosis not present

## 2023-01-27 DIAGNOSIS — I5042 Chronic combined systolic (congestive) and diastolic (congestive) heart failure: Secondary | ICD-10-CM | POA: Diagnosis not present

## 2023-01-27 DIAGNOSIS — J9601 Acute respiratory failure with hypoxia: Secondary | ICD-10-CM | POA: Diagnosis not present

## 2023-01-27 DIAGNOSIS — J849 Interstitial pulmonary disease, unspecified: Secondary | ICD-10-CM | POA: Diagnosis not present

## 2023-01-27 DIAGNOSIS — I480 Paroxysmal atrial fibrillation: Secondary | ICD-10-CM | POA: Diagnosis not present

## 2023-01-27 DIAGNOSIS — I110 Hypertensive heart disease with heart failure: Secondary | ICD-10-CM | POA: Diagnosis not present

## 2023-01-30 ENCOUNTER — Other Ambulatory Visit (HOSPITAL_BASED_OUTPATIENT_CLINIC_OR_DEPARTMENT_OTHER): Payer: Self-pay | Admitting: Pulmonary Disease

## 2023-01-31 DIAGNOSIS — I5042 Chronic combined systolic (congestive) and diastolic (congestive) heart failure: Secondary | ICD-10-CM | POA: Diagnosis not present

## 2023-01-31 DIAGNOSIS — J9601 Acute respiratory failure with hypoxia: Secondary | ICD-10-CM | POA: Diagnosis not present

## 2023-01-31 DIAGNOSIS — E119 Type 2 diabetes mellitus without complications: Secondary | ICD-10-CM | POA: Diagnosis not present

## 2023-01-31 DIAGNOSIS — J849 Interstitial pulmonary disease, unspecified: Secondary | ICD-10-CM | POA: Diagnosis not present

## 2023-01-31 DIAGNOSIS — I480 Paroxysmal atrial fibrillation: Secondary | ICD-10-CM | POA: Diagnosis not present

## 2023-01-31 DIAGNOSIS — I110 Hypertensive heart disease with heart failure: Secondary | ICD-10-CM | POA: Diagnosis not present

## 2023-02-01 ENCOUNTER — Ambulatory Visit: Payer: Medicare Other

## 2023-02-01 ENCOUNTER — Telehealth: Payer: Self-pay | Admitting: Cardiovascular Disease

## 2023-02-01 ENCOUNTER — Other Ambulatory Visit (HOSPITAL_COMMUNITY): Payer: Self-pay | Admitting: *Deleted

## 2023-02-01 DIAGNOSIS — M3130 Wegener's granulomatosis without renal involvement: Secondary | ICD-10-CM | POA: Diagnosis not present

## 2023-02-01 DIAGNOSIS — K921 Melena: Secondary | ICD-10-CM | POA: Diagnosis not present

## 2023-02-01 DIAGNOSIS — D6869 Other thrombophilia: Secondary | ICD-10-CM | POA: Diagnosis not present

## 2023-02-01 DIAGNOSIS — R195 Other fecal abnormalities: Secondary | ICD-10-CM | POA: Diagnosis not present

## 2023-02-01 DIAGNOSIS — D500 Iron deficiency anemia secondary to blood loss (chronic): Secondary | ICD-10-CM | POA: Diagnosis not present

## 2023-02-01 DIAGNOSIS — I4891 Unspecified atrial fibrillation: Secondary | ICD-10-CM | POA: Diagnosis not present

## 2023-02-01 LAB — PROTIME-INR: INR: 2.5 — AB (ref 0.80–1.20)

## 2023-02-01 NOTE — Telephone Encounter (Signed)
Lurena Joiner with [facility] states patient saw Mcneil Sober, NP and she wanted to inform cardiology that INR was 2.5 POCT today. She also mentions that patient will need a call back to clarify Warfarin instruction.

## 2023-02-01 NOTE — Telephone Encounter (Signed)
Left message to call back

## 2023-02-01 NOTE — Telephone Encounter (Signed)
Called and left VM to call back.

## 2023-02-01 NOTE — Telephone Encounter (Signed)
[facility] is returning call. Transferred to Chantel, LPN.

## 2023-02-01 NOTE — Telephone Encounter (Signed)
Received call from [facility] regarding pt having question on his INR being 2.5 and if appt needed to be r/s? Will call pt.

## 2023-02-02 ENCOUNTER — Ambulatory Visit: Payer: Medicare Other

## 2023-02-02 ENCOUNTER — Encounter (HOSPITAL_COMMUNITY): Payer: Medicare Other

## 2023-02-02 ENCOUNTER — Encounter (HOSPITAL_COMMUNITY)
Admission: RE | Admit: 2023-02-02 | Discharge: 2023-02-02 | Disposition: A | Discharge status: Home / Self Care | Payer: Medicare Other | Source: Ambulatory Visit | Attending: Internal Medicine | Admitting: Internal Medicine

## 2023-02-02 ENCOUNTER — Ambulatory Visit (INDEPENDENT_AMBULATORY_CARE_PROVIDER_SITE_OTHER): Payer: Self-pay

## 2023-02-02 ENCOUNTER — Encounter (HOSPITAL_COMMUNITY): Payer: Self-pay

## 2023-02-02 ENCOUNTER — Telehealth: Payer: Self-pay | Admitting: Cardiovascular Disease

## 2023-02-02 ENCOUNTER — Ambulatory Visit (HOSPITAL_COMMUNITY): Payer: Medicare Other | Admitting: Internal Medicine

## 2023-02-02 DIAGNOSIS — I259 Chronic ischemic heart disease, unspecified: Secondary | ICD-10-CM | POA: Diagnosis not present

## 2023-02-02 DIAGNOSIS — G4733 Obstructive sleep apnea (adult) (pediatric): Secondary | ICD-10-CM | POA: Diagnosis present

## 2023-02-02 DIAGNOSIS — Z515 Encounter for palliative care: Secondary | ICD-10-CM | POA: Diagnosis not present

## 2023-02-02 DIAGNOSIS — R0602 Shortness of breath: Secondary | ICD-10-CM | POA: Diagnosis not present

## 2023-02-02 DIAGNOSIS — I7121 Aneurysm of the ascending aorta, without rupture: Secondary | ICD-10-CM | POA: Diagnosis present

## 2023-02-02 DIAGNOSIS — I482 Chronic atrial fibrillation, unspecified: Secondary | ICD-10-CM | POA: Diagnosis not present

## 2023-02-02 DIAGNOSIS — F419 Anxiety disorder, unspecified: Secondary | ICD-10-CM | POA: Diagnosis present

## 2023-02-02 DIAGNOSIS — R918 Other nonspecific abnormal finding of lung field: Secondary | ICD-10-CM | POA: Diagnosis not present

## 2023-02-02 DIAGNOSIS — J849 Interstitial pulmonary disease, unspecified: Secondary | ICD-10-CM | POA: Diagnosis not present

## 2023-02-02 DIAGNOSIS — K921 Melena: Secondary | ICD-10-CM | POA: Diagnosis not present

## 2023-02-02 DIAGNOSIS — D649 Anemia, unspecified: Secondary | ICD-10-CM | POA: Diagnosis not present

## 2023-02-02 DIAGNOSIS — E1165 Type 2 diabetes mellitus with hyperglycemia: Secondary | ICD-10-CM | POA: Diagnosis present

## 2023-02-02 DIAGNOSIS — Z5181 Encounter for therapeutic drug level monitoring: Secondary | ICD-10-CM | POA: Diagnosis not present

## 2023-02-02 DIAGNOSIS — Z9981 Dependence on supplemental oxygen: Secondary | ICD-10-CM | POA: Diagnosis not present

## 2023-02-02 DIAGNOSIS — D509 Iron deficiency anemia, unspecified: Secondary | ICD-10-CM | POA: Diagnosis not present

## 2023-02-02 DIAGNOSIS — T380X5A Adverse effect of glucocorticoids and synthetic analogues, initial encounter: Secondary | ICD-10-CM | POA: Diagnosis present

## 2023-02-02 DIAGNOSIS — Z7401 Bed confinement status: Secondary | ICD-10-CM | POA: Diagnosis not present

## 2023-02-02 DIAGNOSIS — E785 Hyperlipidemia, unspecified: Secondary | ICD-10-CM | POA: Diagnosis present

## 2023-02-02 DIAGNOSIS — R0902 Hypoxemia: Secondary | ICD-10-CM | POA: Diagnosis not present

## 2023-02-02 DIAGNOSIS — I2721 Secondary pulmonary arterial hypertension: Secondary | ICD-10-CM | POA: Diagnosis not present

## 2023-02-02 DIAGNOSIS — Z66 Do not resuscitate: Secondary | ICD-10-CM | POA: Diagnosis not present

## 2023-02-02 DIAGNOSIS — Z7901 Long term (current) use of anticoagulants: Secondary | ICD-10-CM | POA: Diagnosis not present

## 2023-02-02 DIAGNOSIS — M3130 Wegener's granulomatosis without renal involvement: Secondary | ICD-10-CM | POA: Diagnosis not present

## 2023-02-02 DIAGNOSIS — Q211 Atrial septal defect, unspecified: Secondary | ICD-10-CM | POA: Diagnosis not present

## 2023-02-02 DIAGNOSIS — Z7189 Other specified counseling: Secondary | ICD-10-CM | POA: Diagnosis not present

## 2023-02-02 DIAGNOSIS — J84178 Other interstitial pulmonary diseases with fibrosis in diseases classified elsewhere: Secondary | ICD-10-CM | POA: Diagnosis not present

## 2023-02-02 DIAGNOSIS — D72828 Other elevated white blood cell count: Secondary | ICD-10-CM | POA: Diagnosis not present

## 2023-02-02 DIAGNOSIS — I110 Hypertensive heart disease with heart failure: Secondary | ICD-10-CM | POA: Diagnosis not present

## 2023-02-02 DIAGNOSIS — J8410 Pulmonary fibrosis, unspecified: Secondary | ICD-10-CM | POA: Diagnosis not present

## 2023-02-02 DIAGNOSIS — I959 Hypotension, unspecified: Secondary | ICD-10-CM | POA: Diagnosis not present

## 2023-02-02 DIAGNOSIS — F32A Depression, unspecified: Secondary | ICD-10-CM | POA: Diagnosis not present

## 2023-02-02 DIAGNOSIS — E039 Hypothyroidism, unspecified: Secondary | ICD-10-CM | POA: Diagnosis not present

## 2023-02-02 DIAGNOSIS — E46 Unspecified protein-calorie malnutrition: Secondary | ICD-10-CM | POA: Diagnosis not present

## 2023-02-02 DIAGNOSIS — E119 Type 2 diabetes mellitus without complications: Secondary | ICD-10-CM | POA: Diagnosis not present

## 2023-02-02 DIAGNOSIS — E11649 Type 2 diabetes mellitus with hypoglycemia without coma: Secondary | ICD-10-CM | POA: Diagnosis not present

## 2023-02-02 DIAGNOSIS — J9600 Acute respiratory failure, unspecified whether with hypoxia or hypercapnia: Secondary | ICD-10-CM | POA: Diagnosis not present

## 2023-02-02 DIAGNOSIS — J9621 Acute and chronic respiratory failure with hypoxia: Secondary | ICD-10-CM | POA: Diagnosis not present

## 2023-02-02 DIAGNOSIS — R531 Weakness: Secondary | ICD-10-CM | POA: Diagnosis not present

## 2023-02-02 DIAGNOSIS — D500 Iron deficiency anemia secondary to blood loss (chronic): Secondary | ICD-10-CM | POA: Diagnosis not present

## 2023-02-02 DIAGNOSIS — I5032 Chronic diastolic (congestive) heart failure: Secondary | ICD-10-CM | POA: Diagnosis not present

## 2023-02-02 DIAGNOSIS — J9690 Respiratory failure, unspecified, unspecified whether with hypoxia or hypercapnia: Secondary | ICD-10-CM | POA: Diagnosis not present

## 2023-02-02 DIAGNOSIS — I2510 Atherosclerotic heart disease of native coronary artery without angina pectoris: Secondary | ICD-10-CM | POA: Diagnosis present

## 2023-02-02 DIAGNOSIS — J9601 Acute respiratory failure with hypoxia: Secondary | ICD-10-CM | POA: Diagnosis not present

## 2023-02-02 DIAGNOSIS — J9611 Chronic respiratory failure with hypoxia: Secondary | ICD-10-CM | POA: Diagnosis not present

## 2023-02-02 DIAGNOSIS — I480 Paroxysmal atrial fibrillation: Secondary | ICD-10-CM | POA: Diagnosis present

## 2023-02-02 MED ORDER — SODIUM CHLORIDE 0.9 % IV SOLN
510.0000 mg | INTRAVENOUS | Status: DC
  Administered 2023-02-02: 510 mg via INTRAVENOUS
  Filled 2023-02-02: qty 510

## 2023-02-02 NOTE — Telephone Encounter (Signed)
Patient states he is returning a phone called from yesterday.

## 2023-02-02 NOTE — Telephone Encounter (Signed)
Forwarded to [facility]

## 2023-02-02 NOTE — Telephone Encounter (Signed)
Returned call to pt; discussed MD office calling to report INR of 2.5. Pt went to Coumadin clinic today. Meds were changed. Pt to f/u next Wednesday (2/5).

## 2023-02-02 NOTE — Telephone Encounter (Signed)
Please refer to anticoagulation encounter

## 2023-02-02 NOTE — Patient Instructions (Signed)
Description   Take 1/2 tablet today, tomorrow, and Friday and then resume taking 1 tablet daily EXCEPT 1/2 tablet on Monday, Wednesday, and Friday.  Recheck INR on Tuesday.   Currently prednisone 40mg .   Please call us at [phone] with any questions.

## 2023-02-03 ENCOUNTER — Other Ambulatory Visit: Payer: Self-pay

## 2023-02-03 ENCOUNTER — Encounter: Payer: Self-pay | Admitting: Adult Health

## 2023-02-03 ENCOUNTER — Ambulatory Visit: Payer: Medicare Other | Admitting: Primary Care

## 2023-02-03 ENCOUNTER — Emergency Department (HOSPITAL_COMMUNITY): Payer: Medicare Other

## 2023-02-03 ENCOUNTER — Inpatient Hospital Stay (HOSPITAL_COMMUNITY)
Admission: EM | Admit: 2023-02-03 | Discharge: 2023-02-22 | DRG: 542 | Disposition: A | Discharge status: Hospice | Payer: Medicare Other | Source: Ambulatory Visit | Attending: Internal Medicine | Admitting: Internal Medicine

## 2023-02-03 ENCOUNTER — Encounter (HOSPITAL_COMMUNITY): Payer: Self-pay

## 2023-02-03 ENCOUNTER — Ambulatory Visit: Payer: Medicare Other | Admitting: Adult Health

## 2023-02-03 VITALS — BP 112/69 | HR 78

## 2023-02-03 DIAGNOSIS — Z7901 Long term (current) use of anticoagulants: Secondary | ICD-10-CM | POA: Diagnosis not present

## 2023-02-03 DIAGNOSIS — J9600 Acute respiratory failure, unspecified whether with hypoxia or hypercapnia: Secondary | ICD-10-CM | POA: Diagnosis not present

## 2023-02-03 DIAGNOSIS — Z888 Allergy status to other drugs, medicaments and biological substances status: Secondary | ICD-10-CM

## 2023-02-03 DIAGNOSIS — I5032 Chronic diastolic (congestive) heart failure: Secondary | ICD-10-CM | POA: Diagnosis present

## 2023-02-03 DIAGNOSIS — R0902 Hypoxemia: Secondary | ICD-10-CM | POA: Diagnosis not present

## 2023-02-03 DIAGNOSIS — J9690 Respiratory failure, unspecified, unspecified whether with hypoxia or hypercapnia: Secondary | ICD-10-CM | POA: Diagnosis not present

## 2023-02-03 DIAGNOSIS — Z515 Encounter for palliative care: Secondary | ICD-10-CM | POA: Diagnosis not present

## 2023-02-03 DIAGNOSIS — I2510 Atherosclerotic heart disease of native coronary artery without angina pectoris: Secondary | ICD-10-CM | POA: Diagnosis present

## 2023-02-03 DIAGNOSIS — Z8249 Family history of ischemic heart disease and other diseases of the circulatory system: Secondary | ICD-10-CM

## 2023-02-03 DIAGNOSIS — Z85828 Personal history of other malignant neoplasm of skin: Secondary | ICD-10-CM

## 2023-02-03 DIAGNOSIS — D649 Anemia, unspecified: Secondary | ICD-10-CM | POA: Diagnosis not present

## 2023-02-03 DIAGNOSIS — Z7401 Bed confinement status: Secondary | ICD-10-CM | POA: Diagnosis not present

## 2023-02-03 DIAGNOSIS — I259 Chronic ischemic heart disease, unspecified: Secondary | ICD-10-CM | POA: Diagnosis not present

## 2023-02-03 DIAGNOSIS — Z79620 Long term (current) use of immunosuppressive biologic: Secondary | ICD-10-CM

## 2023-02-03 DIAGNOSIS — F419 Anxiety disorder, unspecified: Secondary | ICD-10-CM | POA: Diagnosis present

## 2023-02-03 DIAGNOSIS — J8410 Pulmonary fibrosis, unspecified: Secondary | ICD-10-CM | POA: Diagnosis not present

## 2023-02-03 DIAGNOSIS — D72828 Other elevated white blood cell count: Secondary | ICD-10-CM | POA: Diagnosis present

## 2023-02-03 DIAGNOSIS — I7121 Aneurysm of the ascending aorta, without rupture: Secondary | ICD-10-CM | POA: Diagnosis present

## 2023-02-03 DIAGNOSIS — Z951 Presence of aortocoronary bypass graft: Secondary | ICD-10-CM

## 2023-02-03 DIAGNOSIS — J9611 Chronic respiratory failure with hypoxia: Secondary | ICD-10-CM | POA: Diagnosis not present

## 2023-02-03 DIAGNOSIS — I482 Chronic atrial fibrillation, unspecified: Secondary | ICD-10-CM

## 2023-02-03 DIAGNOSIS — Z7902 Long term (current) use of antithrombotics/antiplatelets: Secondary | ICD-10-CM

## 2023-02-03 DIAGNOSIS — Z8679 Personal history of other diseases of the circulatory system: Secondary | ICD-10-CM

## 2023-02-03 DIAGNOSIS — Z7985 Long-term (current) use of injectable non-insulin antidiabetic drugs: Secondary | ICD-10-CM

## 2023-02-03 DIAGNOSIS — E11649 Type 2 diabetes mellitus with hypoglycemia without coma: Secondary | ICD-10-CM | POA: Diagnosis not present

## 2023-02-03 DIAGNOSIS — F32A Depression, unspecified: Secondary | ICD-10-CM | POA: Diagnosis present

## 2023-02-03 DIAGNOSIS — K921 Melena: Secondary | ICD-10-CM | POA: Diagnosis present

## 2023-02-03 DIAGNOSIS — M3130 Wegener's granulomatosis without renal involvement: Principal | ICD-10-CM | POA: Diagnosis present

## 2023-02-03 DIAGNOSIS — I110 Hypertensive heart disease with heart failure: Secondary | ICD-10-CM | POA: Diagnosis present

## 2023-02-03 DIAGNOSIS — I480 Paroxysmal atrial fibrillation: Secondary | ICD-10-CM | POA: Diagnosis present

## 2023-02-03 DIAGNOSIS — E46 Unspecified protein-calorie malnutrition: Secondary | ICD-10-CM | POA: Diagnosis not present

## 2023-02-03 DIAGNOSIS — Z823 Family history of stroke: Secondary | ICD-10-CM

## 2023-02-03 DIAGNOSIS — Z7189 Other specified counseling: Secondary | ICD-10-CM | POA: Diagnosis not present

## 2023-02-03 DIAGNOSIS — J849 Interstitial pulmonary disease, unspecified: Secondary | ICD-10-CM | POA: Diagnosis not present

## 2023-02-03 DIAGNOSIS — E039 Hypothyroidism, unspecified: Secondary | ICD-10-CM | POA: Diagnosis not present

## 2023-02-03 DIAGNOSIS — T380X5A Adverse effect of glucocorticoids and synthetic analogues, initial encounter: Secondary | ICD-10-CM | POA: Diagnosis present

## 2023-02-03 DIAGNOSIS — E1165 Type 2 diabetes mellitus with hyperglycemia: Secondary | ICD-10-CM | POA: Diagnosis present

## 2023-02-03 DIAGNOSIS — E119 Type 2 diabetes mellitus without complications: Secondary | ICD-10-CM | POA: Diagnosis not present

## 2023-02-03 DIAGNOSIS — Z955 Presence of coronary angioplasty implant and graft: Secondary | ICD-10-CM

## 2023-02-03 DIAGNOSIS — D509 Iron deficiency anemia, unspecified: Secondary | ICD-10-CM | POA: Diagnosis present

## 2023-02-03 DIAGNOSIS — Z7952 Long term (current) use of systemic steroids: Secondary | ICD-10-CM

## 2023-02-03 DIAGNOSIS — J84178 Other interstitial pulmonary diseases with fibrosis in diseases classified elsewhere: Secondary | ICD-10-CM | POA: Diagnosis not present

## 2023-02-03 DIAGNOSIS — I2721 Secondary pulmonary arterial hypertension: Secondary | ICD-10-CM | POA: Diagnosis not present

## 2023-02-03 DIAGNOSIS — E785 Hyperlipidemia, unspecified: Secondary | ICD-10-CM | POA: Diagnosis present

## 2023-02-03 DIAGNOSIS — Z9981 Dependence on supplemental oxygen: Secondary | ICD-10-CM | POA: Diagnosis not present

## 2023-02-03 DIAGNOSIS — J9620 Acute and chronic respiratory failure, unspecified whether with hypoxia or hypercapnia: Secondary | ICD-10-CM | POA: Insufficient documentation

## 2023-02-03 DIAGNOSIS — Z7984 Long term (current) use of oral hypoglycemic drugs: Secondary | ICD-10-CM

## 2023-02-03 DIAGNOSIS — D500 Iron deficiency anemia secondary to blood loss (chronic): Secondary | ICD-10-CM | POA: Diagnosis not present

## 2023-02-03 DIAGNOSIS — Z79890 Hormone replacement therapy (postmenopausal): Secondary | ICD-10-CM

## 2023-02-03 DIAGNOSIS — J9621 Acute and chronic respiratory failure with hypoxia: Secondary | ICD-10-CM | POA: Diagnosis not present

## 2023-02-03 DIAGNOSIS — R0602 Shortness of breath: Secondary | ICD-10-CM | POA: Diagnosis present

## 2023-02-03 DIAGNOSIS — R531 Weakness: Secondary | ICD-10-CM | POA: Diagnosis not present

## 2023-02-03 DIAGNOSIS — Z96641 Presence of right artificial hip joint: Secondary | ICD-10-CM | POA: Diagnosis present

## 2023-02-03 DIAGNOSIS — G4733 Obstructive sleep apnea (adult) (pediatric): Secondary | ICD-10-CM | POA: Diagnosis present

## 2023-02-03 DIAGNOSIS — Z87891 Personal history of nicotine dependence: Secondary | ICD-10-CM

## 2023-02-03 DIAGNOSIS — Q211 Atrial septal defect, unspecified: Secondary | ICD-10-CM | POA: Diagnosis not present

## 2023-02-03 DIAGNOSIS — Z79899 Other long term (current) drug therapy: Secondary | ICD-10-CM

## 2023-02-03 DIAGNOSIS — Z66 Do not resuscitate: Secondary | ICD-10-CM | POA: Diagnosis present

## 2023-02-03 DIAGNOSIS — J9601 Acute respiratory failure with hypoxia: Principal | ICD-10-CM

## 2023-02-03 DIAGNOSIS — R918 Other nonspecific abnormal finding of lung field: Secondary | ICD-10-CM | POA: Diagnosis not present

## 2023-02-03 DIAGNOSIS — Z7982 Long term (current) use of aspirin: Secondary | ICD-10-CM

## 2023-02-03 DIAGNOSIS — I959 Hypotension, unspecified: Secondary | ICD-10-CM | POA: Diagnosis not present

## 2023-02-03 LAB — C-REACTIVE PROTEIN: CRP: 14 mg/dL — ABNORMAL HIGH (ref <1.0)

## 2023-02-03 LAB — COMPREHENSIVE METABOLIC PANEL
ALT: 25 U/L (ref 0–44)
AST: 23 U/L (ref 15–41)
Albumin: 3 g/dL — ABNORMAL LOW (ref 3.5–5.0)
Alkaline Phosphatase: 72 U/L (ref 38–126)
Anion gap: 13 (ref 5–15)
BUN: 27 mg/dL — ABNORMAL HIGH (ref 8–23)
CO2: 25 mmol/L (ref 22–32)
Calcium: 9.2 mg/dL (ref 8.9–10.3)
Chloride: 102 mmol/L (ref 98–111)
Creatinine, Ser: 1.04 mg/dL (ref 0.61–1.24)
GFR, Estimated: >60 mL/min (ref >60)
Glucose, Bld: 116 mg/dL — ABNORMAL HIGH (ref 70–99)
Potassium: 3.9 mmol/L (ref 3.5–5.1)
Sodium: 140 mmol/L (ref 135–145)
Total Bilirubin: 0.6 mg/dL (ref 0.0–1.2)
Total Protein: 5.3 g/dL — ABNORMAL LOW (ref 6.5–8.1)

## 2023-02-03 LAB — CBC WITH DIFFERENTIAL/PLATELET
Abs Immature Granulocytes: 0.26 10*3/uL — ABNORMAL HIGH (ref 0.00–0.07)
Basophils Absolute: 0.1 10*3/uL (ref 0.0–0.1)
Basophils Relative: 1 %
Eosinophils Absolute: 0.3 10*3/uL (ref 0.0–0.5)
Eosinophils Relative: 2 %
HCT: 30.1 % — ABNORMAL LOW (ref 39.0–52.0)
Hemoglobin: 8.7 g/dL — ABNORMAL LOW (ref 13.0–17.0)
Immature Granulocytes: 2 %
Lymphocytes Relative: 7 %
Lymphs Abs: 1 10*3/uL (ref 0.7–4.0)
MCH: 24.9 pg — ABNORMAL LOW (ref 26.0–34.0)
MCHC: 28.9 g/dL — ABNORMAL LOW (ref 30.0–36.0)
MCV: 86 fL (ref 80.0–100.0)
Monocytes Absolute: 1.1 10*3/uL — ABNORMAL HIGH (ref 0.1–1.0)
Monocytes Relative: 8 %
Neutro Abs: 11.1 10*3/uL — ABNORMAL HIGH (ref 1.7–7.7)
Neutrophils Relative %: 80 %
Platelets: 341 10*3/uL (ref 150–400)
RBC: 3.5 MIL/uL — ABNORMAL LOW (ref 4.22–5.81)
RDW: 22.6 % — ABNORMAL HIGH (ref 11.5–15.5)
WBC: 13.8 10*3/uL — ABNORMAL HIGH (ref 4.0–10.5)
nRBC: 1.3 % — ABNORMAL HIGH (ref 0.0–0.2)

## 2023-02-03 LAB — IRON AND TIBC
Iron: 162 ug/dL (ref 45–182)
Saturation Ratios: 39 % (ref 17.9–39.5)
TIBC: 412 ug/dL (ref 250–450)
UIBC: 250 ug/dL

## 2023-02-03 LAB — TROPONIN I (HIGH SENSITIVITY)
Troponin I (High Sensitivity): 31 ng/L — ABNORMAL HIGH (ref <18)
Troponin I (High Sensitivity): 31 ng/L — ABNORMAL HIGH (ref <18)
Troponin I (High Sensitivity): 37 ng/L — ABNORMAL HIGH (ref <18)

## 2023-02-03 LAB — MAGNESIUM: Magnesium: 2.3 mg/dL (ref 1.7–2.4)

## 2023-02-03 LAB — CBG MONITORING, ED
Glucose-Capillary: 153 mg/dL — ABNORMAL HIGH (ref 70–99)
Glucose-Capillary: 169 mg/dL — ABNORMAL HIGH (ref 70–99)

## 2023-02-03 LAB — RETICULOCYTES
Immature Retic Fract: 40.9 % — ABNORMAL HIGH (ref 2.3–15.9)
RBC.: 3.74 MIL/uL — ABNORMAL LOW (ref 4.22–5.81)
Retic Count, Absolute: 209.8 10*3/uL — ABNORMAL HIGH (ref 19.0–186.0)
Retic Ct Pct: 5.6 % — ABNORMAL HIGH (ref 0.4–3.1)

## 2023-02-03 LAB — RESP PANEL BY RT-PCR (RSV, FLU A&B, COVID)  RVPGX2
Influenza A by PCR: NEGATIVE
Influenza B by PCR: NEGATIVE
Resp Syncytial Virus by PCR: NEGATIVE
SARS Coronavirus 2 by RT PCR: NEGATIVE

## 2023-02-03 LAB — BRAIN NATRIURETIC PEPTIDE: B Natriuretic Peptide: 355.8 pg/mL — ABNORMAL HIGH (ref 0.0–100.0)

## 2023-02-03 LAB — TYPE AND SCREEN
ABO/RH(D): O NEG
Antibody Screen: NEGATIVE

## 2023-02-03 LAB — PROCALCITONIN: Procalcitonin: 0.84 ng/mL

## 2023-02-03 LAB — PROTIME-INR
INR: 2.7 — ABNORMAL HIGH (ref 0.8–1.2)
Prothrombin Time: 29.2 s — ABNORMAL HIGH (ref 11.4–15.2)

## 2023-02-03 LAB — FOLATE: Folate: 15.7 ng/mL (ref >5.9)

## 2023-02-03 LAB — VITAMIN B12: Vitamin B-12: 536 pg/mL (ref 180–914)

## 2023-02-03 LAB — FERRITIN: Ferritin: 470 ng/mL — ABNORMAL HIGH (ref 24–336)

## 2023-02-03 MED ORDER — DAPSONE 100 MG PO TABS
100.0000 mg | ORAL_TABLET | Freq: Every day | ORAL | Status: DC
  Administered 2023-02-04 – 2023-02-20 (×17): 100 mg via ORAL
  Filled 2023-02-03 (×18): qty 1

## 2023-02-03 MED ORDER — CLOPIDOGREL BISULFATE 75 MG PO TABS
75.0000 mg | ORAL_TABLET | Freq: Every day | ORAL | Status: DC
  Administered 2023-02-04 – 2023-02-06 (×3): 75 mg via ORAL
  Filled 2023-02-03 (×4): qty 1

## 2023-02-03 MED ORDER — DOFETILIDE 500 MCG PO CAPS
500.0000 ug | ORAL_CAPSULE | Freq: Two times a day (BID) | ORAL | Status: AC
  Administered 2023-02-03 – 2023-02-19 (×32): 500 ug via ORAL
  Administered 2023-02-19: 125 ug via ORAL
  Administered 2023-02-20: 500 ug via ORAL
  Filled 2023-02-03 (×10): qty 4
  Filled 2023-02-03: qty 1
  Filled 2023-02-03 (×15): qty 4
  Filled 2023-02-03: qty 1
  Filled 2023-02-03: qty 4
  Filled 2023-02-03: qty 1
  Filled 2023-02-03 (×3): qty 4
  Filled 2023-02-03 (×2): qty 1
  Filled 2023-02-03 (×4): qty 4

## 2023-02-03 MED ORDER — METHYLPREDNISOLONE SODIUM SUCC 125 MG IJ SOLR: 60.0000 mg | INTRAMUSCULAR | Status: DC

## 2023-02-03 MED ORDER — PANTOPRAZOLE SODIUM 40 MG PO TBEC
40.0000 mg | DELAYED_RELEASE_TABLET | Freq: Two times a day (BID) | ORAL | Status: DC
  Administered 2023-02-03 – 2023-02-20 (×33): 40 mg via ORAL
  Filled 2023-02-03 (×22): qty 1
  Filled 2023-02-03: qty 2
  Filled 2023-02-03 (×10): qty 1

## 2023-02-03 MED ORDER — METHYLPREDNISOLONE SODIUM SUCC 125 MG IJ SOLR: 60.0000 mg | Freq: Four times a day (QID) | INTRAMUSCULAR | Status: DC

## 2023-02-03 MED ORDER — LEVOTHYROXINE SODIUM 25 MCG PO TABS
137.0000 ug | ORAL_TABLET | Freq: Every day | ORAL | Status: DC
  Administered 2023-02-04 – 2023-02-11 (×8): 137 ug via ORAL
  Filled 2023-02-03 (×8): qty 1

## 2023-02-03 MED ORDER — BISACODYL 5 MG PO TBEC: 5.0000 mg | DELAYED_RELEASE_TABLET | Freq: Every day | ORAL | Status: DC | PRN

## 2023-02-03 MED ORDER — VENLAFAXINE HCL ER 75 MG PO CP24
150.0000 mg | ORAL_CAPSULE | Freq: Every day | ORAL | Status: DC
  Administered 2023-02-04 – 2023-02-21 (×18): 150 mg via ORAL
  Filled 2023-02-03 (×18): qty 2

## 2023-02-03 MED ORDER — METOPROLOL TARTRATE 5 MG/5ML IV SOLN: 5.0000 mg | Freq: Three times a day (TID) | INTRAVENOUS | Status: DC | PRN

## 2023-02-03 MED ORDER — PANTOPRAZOLE SODIUM 40 MG PO TBEC: 40.0000 mg | DELAYED_RELEASE_TABLET | Freq: Every day | ORAL | Status: DC

## 2023-02-03 MED ORDER — METHYLPREDNISOLONE SODIUM SUCC 125 MG IJ SOLR
125.0000 mg | Freq: Two times a day (BID) | INTRAMUSCULAR | Status: DC
Start: 2023-02-03 — End: 2023-02-11
  Administered 2023-02-03 – 2023-02-11 (×16): 125 mg via INTRAVENOUS
  Filled 2023-02-03 (×16): qty 2

## 2023-02-03 MED ORDER — INSULIN ASPART 100 UNIT/ML IJ SOLN
0.0000 [IU] | Freq: Three times a day (TID) | INTRAMUSCULAR | Status: DC
  Administered 2023-02-04: 5 [IU] via SUBCUTANEOUS
  Administered 2023-02-05: 7 [IU] via SUBCUTANEOUS
  Administered 2023-02-05: 2 [IU] via SUBCUTANEOUS
  Administered 2023-02-05 – 2023-02-06 (×2): 1 [IU] via SUBCUTANEOUS
  Administered 2023-02-06: 3 [IU] via SUBCUTANEOUS
  Administered 2023-02-06 – 2023-02-07 (×2): 2 [IU] via SUBCUTANEOUS
  Administered 2023-02-07: 1 [IU] via SUBCUTANEOUS
  Administered 2023-02-08 (×2): 3 [IU] via SUBCUTANEOUS
  Administered 2023-02-08: 2 [IU] via SUBCUTANEOUS
  Administered 2023-02-09 (×2): 9 [IU] via SUBCUTANEOUS
  Administered 2023-02-09: 1 [IU] via SUBCUTANEOUS
  Administered 2023-02-10: 5 [IU] via SUBCUTANEOUS

## 2023-02-03 MED ORDER — INSULIN ASPART 100 UNIT/ML IJ SOLN
0.0000 [IU] | Freq: Every day | INTRAMUSCULAR | Status: DC
  Administered 2023-02-04 – 2023-02-07 (×3): 2 [IU] via SUBCUTANEOUS
  Administered 2023-02-09: 4 [IU] via SUBCUTANEOUS

## 2023-02-03 MED ORDER — NITROGLYCERIN 0.4 MG SL SUBL: 0.4000 mg | SUBLINGUAL_TABLET | SUBLINGUAL | Status: DC | PRN

## 2023-02-03 MED ORDER — FUROSEMIDE 10 MG/ML IJ SOLN
40.0000 mg | Freq: Once | INTRAMUSCULAR | Status: DC
  Filled 2023-02-03: qty 4

## 2023-02-03 MED ORDER — CARVEDILOL 3.125 MG PO TABS
3.1250 mg | ORAL_TABLET | Freq: Two times a day (BID) | ORAL | Status: DC
Start: 2023-02-04 — End: 2023-02-22
  Administered 2023-02-04 – 2023-02-21 (×29): 3.125 mg via ORAL
  Filled 2023-02-03 (×35): qty 1

## 2023-02-03 MED ORDER — ACETAMINOPHEN 650 MG RE SUPP
650.0000 mg | Freq: Four times a day (QID) | RECTAL | Status: DC | PRN
  Administered 2023-02-22: 650 mg via RECTAL
  Filled 2023-02-03: qty 1

## 2023-02-03 MED ORDER — FUROSEMIDE 10 MG/ML IJ SOLN
40.0000 mg | Freq: Once | INTRAMUSCULAR | Status: AC
  Administered 2023-02-03: 40 mg via INTRAVENOUS

## 2023-02-03 MED ORDER — PROMETHAZINE HCL 12.5 MG PO TABS: 12.5000 mg | ORAL_TABLET | Freq: Four times a day (QID) | ORAL | Status: DC | PRN

## 2023-02-03 MED ORDER — ALPRAZOLAM 0.5 MG PO TABS
0.5000 mg | ORAL_TABLET | Freq: Every day | ORAL | Status: DC
  Administered 2023-02-03 – 2023-02-20 (×18): 0.5 mg via ORAL
  Filled 2023-02-03 (×7): qty 1
  Filled 2023-02-03: qty 2
  Filled 2023-02-03 (×10): qty 1

## 2023-02-03 MED ORDER — FUROSEMIDE 10 MG/ML IJ SOLN
40.0000 mg | Freq: Every day | INTRAMUSCULAR | Status: DC
  Administered 2023-02-04 – 2023-02-05 (×2): 40 mg via INTRAVENOUS
  Filled 2023-02-03 (×2): qty 4

## 2023-02-03 MED ORDER — ASPIRIN 81 MG PO CHEW
81.0000 mg | CHEWABLE_TABLET | Freq: Every day | ORAL | Status: DC
  Administered 2023-02-04 – 2023-02-20 (×17): 81 mg via ORAL
  Filled 2023-02-03 (×18): qty 1

## 2023-02-03 MED ORDER — ACETAMINOPHEN 325 MG PO TABS
650.0000 mg | ORAL_TABLET | Freq: Four times a day (QID) | ORAL | Status: DC | PRN
  Filled 2023-02-03: qty 2

## 2023-02-03 NOTE — ED Triage Notes (Signed)
PER EMS: pt is from his pulmonologist doctor's office with c/o exertional shortness of breath that started this morning. He wears 3LNC at baseline and needed to increase it to 6L Desha because his home SpO2 oximeter this morning was 40%. EMS arrived to the doctors office and he was on 15L NRB, sats 100%.  He also concerned about a GI bleed that he has had for 6 years and is getting worse.  BP- 118/60, HR-72, RR-22, CBG-141, GCS-15

## 2023-02-03 NOTE — Consult Note (Addendum)
NAME:  Gary [name], MRN:  [id number], DOB:  1945/09/19, LOS: 0 ADMISSION DATE:  02/03/2023, CONSULTATION DATE:  02/03/2023 REFERRING MD:  Dr. Susa Raring, CHIEF COMPLAINT:  dyspnea, hypoxia   History of Present Illness:  This 78 year old Caucasian male is seen in consultation at the request of Dr. Susa Raring for recommendations on further evaluation and management of hypoxia.  The patient was sent to [facility] emergency department from [facility] pulmonary clinic today, after he presented with reports of having profound hypoxia at home (down to SpO2 45%).  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  He is on rituximab and steroids.  In fact, he was recently hospitalized for interstitial lung disease flareup and has been on a prednisone taper, currently on prednisone 40 mg p.o. daily.  Treated with high-dose steroids.  Right heart cath on 01/04/2023 showed normal filling pressures, mild pulmonary hypertension and preserved cardiac output.  He reported doing well after his recent hospitalization and felt that he had a very good day yesterday.  However, this morning he woke up and experienced extreme exertional dyspnea while getting from his bed to the bathroom, when he noted that his SpO2 went down to 45%.  He is normally on supplemental oxygen at 2-2.5 LPM via nasal cannula.  He does titrate as needed to maintain SpO2 90%.  However, he noted that despite turning his supplemental oxygen up to 6 LPM (which he typically needs during ambulation), he was not recovering.  He denies cough.  He denies pleuritic pain.  He denies sputum production.  He denies chest pain.  He does state that when he was profoundly hypoxic he did notice that he was experiencing blurring of vision.  He has noted some modest increased swelling in his lower extremities.  He does confess that he is prescribed daily furosemide but is inconsistent with adherence to  prescribed therapy.  Patient says he has had a very stressful and very active week, which revolves around having to get to multiple medical appointments.  He had an iron infusion a few days ago.  He also had a Rituxan infusion earlier this week.  Patient says he has been having blood in his stools, although he says that this is not any more than usual.  This has been a chronic problem ever since his diagnosis of Wegener's granulomatosis.    He is on Coumadin for chronic atrial fibrillation.  His last INR check (01/24/2023) was supratherapeutic, 6.7.  However, upon recheck in the emergency department, this has fallen into the therapeutic range, 2.7.  Last hemoglobin reported by patient was 8.2, currently 8.7.   Pertinent  Medical History  PAST MEDICAL history significant for Wegener's granulomatosis (diagnosed 2019, on Rituxan and steroids), type 2 diabetes mellitus, OSA on CPAP, atrial fibrillation on coumadin, HFpEF (LVEF 65 in 12/28/2022, grade 1 diastolic dysfunction), pulmonary hypertension, coronary artery disease.  Significant Hospital Events: Including procedures, antibiotic start and stop dates in addition to other pertinent events     Interim History / Subjective:  1/30 PCCM consulted for acute on chronic hypoxemic respiratory failure.  Objective   Blood pressure (!) 106/52, pulse 74, temperature 98 F (36.7 C), temperature source Oral, resp. rate 19, height 5\' 9"  (1.753 m), weight 94.8 kg, SpO2 95%.       No intake or output data in the 24 hours ending 02/03/23 1543 Filed Weights   02/03/23 1227  Weight: 94.8 kg    Examination:  General: Pale.  No acute distress.  Alert, awake and oriented to time, person and place. HENT: Pupils are equal round and reactive to light and accommodation.  Extraocular muscle functions are grossly intact.  No conjunctival anemia.  No injection.  Sclera are nonicteric. Lungs: Diffuse bilateral lower pitched crackles.  Egophony over the left lower  hemithorax.  No rhonchi.  No wheezes. Cardiovascular: Regular S1 and S2 without murmur, rub or gallop. Abdomen: Soft.  Flat.  Bowel sounds present.  Nontender.  Nondistended. Extremities: Trace pitting edema.  Equivocal clubbing.  No cyanosis.  Scattered cherry hemangiomata noted. Neuro: Cranial nerves II through XII are grossly symmetric and physiologic.  No sensory or motor level was elicited. GU: Foley catheter.  Resolved Hospital Problem list   Not applicable.  Assessment & Plan:  Acute on chronic hypoxemic respiratory failure Abnormal chest x-ray in the setting of immunocompromise CPAP nightly and as needed. Titrate FiO2 to maintain SpO2 93+ percent. Follow-up proBNP, procalcitonin. Start furosemide 40 mg IV daily. Interstitial lung disease Pirfenidone is noted on his home medications. Would hold for now (not on formulary).  Anticipate resuming at discharge. Wegener's granulomatosis Increase Solu-Medrol to 125 mg IV q 12 hr. May need to consider whether or not this is a complication related to rituximab therapy.   PCCM will follow.   Normocytic anemia GI bleed, NOS Previously demonstrated microcytic anemia.  On iron infusions. Monitor hemoglobin. The patient states that he is aware that he has a chronic "oozing" from his GI tract related to his Wegener's granulomatosis.  He has undergone colonoscopy which is identified slow blood loss from his GI tract. Given that he has been on a slow prednisone taper and will actually continue on steroid therapy during this hospitalization, increase Protonix to 40 mg IV twice daily. Leukocytosis in the setting of ongoing steroid therapy.  No bandemia. Atrial fibrillation Continue Tikosyn, carvedilol, Coumadin   Best Practice (right click and "Reselect all SmartList Selections" daily)   Diet/type: Regular consistency (see orders) DVT prophylaxis Coumadin Pressure ulcer(s): N/A GI prophylaxis: PPI Lines: N/A Foley:  N/A Code  Status:  full code Last date of multidisciplinary goals of care discussion [PENDING]  Labs   CBC: Recent Labs  Lab 02/03/23 1304  WBC 13.8*  NEUTROABS 11.1*  HGB 8.7*  HCT 30.1*  MCV 86.0  PLT 341    Basic Metabolic Panel: Recent Labs  Lab 02/03/23 1304  NA 140  K 3.9  CL 102  CO2 25  GLUCOSE 116*  BUN 27*  CREATININE 1.04  CALCIUM 9.2   GFR: Estimated Creatinine Clearance: 65.4 mL/min (by C-G formula based on SCr of 1.04 mg/dL). Recent Labs  Lab 02/03/23 1304  WBC 13.8*    Liver Function Tests: Recent Labs  Lab 02/03/23 1304  AST 23  ALT 25  ALKPHOS 72  BILITOT 0.6  PROT 5.3*  ALBUMIN 3.0*   No results for input(s): "LIPASE", "AMYLASE" in the last 168 hours. No results for input(s): "AMMONIA" in the last 168 hours.  ABG    Component Value Date/Time   HCO3 31.7 (H) 01/04/2023 0855   HCO3 31.3 (H) 01/04/2023 0855   TCO2 33 (H) 01/04/2023 0855   TCO2 33 (H) 01/04/2023 0855   O2SAT 56 01/04/2023 0855   O2SAT 57 01/04/2023 0855     Coagulation Profile: Recent Labs  Lab 02/01/23 0000 02/03/23 1304  INR 2.50* 2.7*    Cardiac Enzymes: No results for input(s): "CKTOTAL", "CKMB", "CKMBINDEX", "TROPONINI" in the last 168  hours.  HbA1C: Hgb A1c MFr Bld  Date/Time Value Ref Range Status  01/02/2023 09:04 PM 5.9 (H) 4.8 - 5.6 % Final    Comment:    (NOTE)         Prediabetes: 5.7 - 6.4         Diabetes: >6.4         Glycemic control for adults with diabetes: <7.0   09/18/2022 07:06 AM 6.5 (H) 4.8 - 5.6 % Final    Comment:    (NOTE)         Prediabetes: 5.7 - 6.4         Diabetes: >6.4         Glycemic control for adults with diabetes: <7.0     CBG: No results for input(s): "GLUCAP" in the last 168 hours.  Review of Systems:   No fever.  No chills.  No nausea or vomiting.   No heartburn.  No chest pain.  No palpitations. Severe exertional dyspnea.  Hypoxia/cyanosis.  No cough or sputum production.  No hemoptysis.  No wheezing.  No  pleuritic pain. No abdominal pain.  No distention.  No constipation or diarrhea.  Dark melanotic stools (chronic problem). Mild increase in lower extremity edema.  No paresthesias.  No calf pain. No confusion.  No syncope/loss of consciousness.  Past Medical History:  He,  has a past medical history of Aortic aneurysm (HCC) (02/20/2016), Arthritis, Atrial fibrillation (HCC) (11/13/2014), Cancer (HCC), Chronic combined systolic and diastolic heart failure (HCC) (69/62/9528), Coronary artery disease, Depression, Essential hypertension (11/13/2014), Hyperlipidemia (11/13/2014), ILD (interstitial lung disease) (HCC), Sleep apnea, and Wegener's granulomatosis (11/18/2017).   Surgical History:   Past Surgical History:  Procedure Laterality Date   CARDIOVERSION N/A 12/06/2014   Procedure: CARDIOVERSION;  Surgeon: Chilton Si, MD;  Location: [facility] ENDOSCOPY;  Service: Cardiovascular;  Laterality: N/A;   CARDIOVERSION N/A 05/26/2017   Procedure: CARDIOVERSION;  Surgeon: Chilton Si, MD;  Location[facility] ENDOSCOPY;  Service: Cardiovascular;  Laterality: N/A;   CARDIOVERSION N/A 04/25/2019   Procedure: CARDIOVERSION;  Surgeon: Thurmon Fair, MD;  Location: [facility] ENDOSCOPY;  Service: Cardiovascular;  Laterality: N/A;   CARDIOVERSION N/A 02/26/2022   Procedure: CARDIOVERSION;  Surgeon: Chilton Si, MD;  Location: [facility] ENDOSCOPY;  Service: Cardiovascular;  Laterality: N/A;   CARDIOVERSION N/A 05/13/2022   Procedure: CARDIOVERSION;  Surgeon: Quintella Reichert, MD;  Location: [facility] INVASIVE CV LAB;  Service: Cardiovascular;  Laterality: N/A;   CARPAL TUNNEL RELEASE     CORONARY ANGIOPLASTY     multiple stents   CORONARY ARTERY BYPASS GRAFT  2006   CORONARY STENT INTERVENTION N/A 09/28/2021   Procedure: CORONARY STENT INTERVENTION;  Surgeon: Runell Gess, MD;  Location: [facility] INVASIVE CV LAB;  Service: Cardiovascular;  Laterality: N/A;  SVG-PDA   EYE SURGERY     right eye-  muscular repair   LEFT HEART  CATH AND CORS/GRAFTS ANGIOGRAPHY N/A 09/28/2021   Procedure: LEFT HEART CATH AND CORS/GRAFTS ANGIOGRAPHY;  Surgeon: Runell Gess, MD;  Location: [facility] INVASIVE CV LAB;  Service: Cardiovascular;  Laterality: N/A;   RIGHT HEART CATH N/A 01/04/2023   Procedure: RIGHT HEART CATH;  Surgeon: Laurey Morale, MD;  Location: [facility] INVASIVE CV LAB;  Service: Cardiovascular;  Laterality: N/A;   TOTAL HIP ARTHROPLASTY  01/03/2012   Procedure: TOTAL HIP ARTHROPLASTY;  Surgeon: Jacki Cones, MD;  Location: [facility] ORS;  Service: Orthopedics;  Laterality: Right;     Social History:   reports that he quit smoking about 39  years ago. His smoking use included cigarettes. He has never used smokeless tobacco. He reports that he does not drink alcohol and does not use drugs.   Family History:  His family history includes Heart attack in his maternal grandfather, mother, paternal grandfather, and paternal grandmother; Other in his father; Stroke in his maternal grandmother.   Allergies Allergies  Allergen Reactions   Farxiga [Dapagliflozin] Other (See Comments)    Lightheadedness Low blood pressure   Statins Other (See Comments)    Myalgias      Home Medications  Prior to Admission medications   Medication Sig Start Date End Date Taking? Authorizing Provider  ALPRAZolam Prudy Feeler) 0.5 MG tablet Take 0.5 mg by mouth at bedtime. Takes at 11:00pm    [provider]  aspirin 81 MG chewable tablet Chew 81 mg by mouth daily.    [provider]  Calcium Carb-Cholecalciferol (CALCIUM 600/VITAMIN D3 PO) Take 1 tablet by mouth daily.    [provider]  carvedilol (COREG) 25 MG tablet Take 0.5 tablets (12.5 mg total) by mouth 2 (two) times daily. Patient taking differently: Take 12.5 mg by mouth 2 (two) times daily. Hold dose if SBP <110. 03/06/22   Zannie Cove, MD  Cholecalciferol (VITAMIN D-3) 125 MCG (5000 UT) TABS Take 10,000 Units by mouth daily.    [provider]   clopidogrel (PLAVIX) 75 MG tablet Take 75 mg by mouth daily. 01/26/23   [provider]  Coenzyme Q10 (COQ10 PO) Take 400 mg by mouth daily.    [provider]  dapsone 100 MG tablet Take 1 tablet (100 mg total) by mouth daily. 01/07/23   Hunsucker, Lesia Sago, MD  dofetilide (TIKOSYN) 500 MCG capsule TAKE 1 CAPSULE BY MOUTH 2 TIMES DAILY. 11/02/22   Eustace Pen, PA-C  Evolocumab (REPATHA SURECLICK) 140 MG/ML SOAJ INJECT 140 MG INTO THE SKIN EVERY 14 (FOURTEEN) DAYS. INJECT 1 PEN INTO THE FATTY TISSUE SKIN EVERY 14 DAY 05/06/22   Chilton Si, MD  ferrous sulfate 325 (65 FE) MG EC tablet Take 325 mg by mouth daily with breakfast.    [provider]  furosemide (LASIX) 40 MG tablet Take 1 tablet (40 mg total) by mouth daily. 01/07/23   Zannie Cove, MD  levothyroxine (SYNTHROID, LEVOTHROID) 137 MCG tablet Take 137 mcg by mouth daily before breakfast. 10/27/14   [provider]  loratadine (CLARITIN) 10 MG tablet Take 10 mg by mouth daily.    [provider]  metFORMIN (GLUCOPHAGE) 500 MG tablet Take 500 mg by mouth 2 (two) times daily. 10/27/17   [provider]  Multiple Vitamins-Minerals (CENTRUM SILVER 50+MEN) TABS Take 1 tablet by mouth daily.    [provider]  nitroGLYCERIN (NITROSTAT) 0.4 MG SL tablet Place 1 tablet (0.4 mg total) under the tongue every 5 (five) minutes x 3 doses as needed for chest pain. 09/29/21   Jonita Albee, PA-C  pantoprazole (PROTONIX) 40 MG tablet TAKE 1 TABLET BY MOUTH 2 (TWO) TIMES DAILY FOR 7 DAYS, THEN 1 TABLET (40 MG TOTAL) DAILY. 01/13/23   Alver Sorrow, NP  Pirfenidone 267 MG TABS Take 1 tab three times daily for 7 days, then 2 tabs three times daily for 7 days, then 3 tabs three times daily thereafter. 01/18/23   Kalman Shan, MD  potassium chloride SA (KLOR-CON M) 20 MEQ tablet Take 1 tablet (20 mEq total) by mouth daily. 01/07/23   Zannie Cove, MD  predniSONE (DELTASONE) 20 MG  tablet  Take 60 mg (3 tabs) daily for 2 weeks, cut down to 40 mg (2 tabs) daily from 1/17 01/07/23   Zannie Cove, MD  riTUXimab (RITUXAN IV) Inject 1 application  into the vein See admin instructions. Take every two weeks and every six months alternating    [provider]  Semaglutide (OZEMPIC, 0.25 OR 0.5 MG/DOSE, [place]) Inject 0.5 mg into the skin once a week.    [provider]  testosterone cypionate (DEPOTESTOSTERONE CYPIONATE) 200 MG/ML injection Inject 150 mg into the muscle every 14 (fourteen) days.    [provider]  venlafaxine XR (EFFEXOR-XR) 150 MG 24 hr capsule Take 150 mg by mouth daily.    [provider]  warfarin (COUMADIN) 5 MG tablet TAKE 1/2 TABLET TO 1 TABLET BY MOUTH DAILY AS DIRECTED BY COUMADIN CLINIC Patient taking differently: Take 2.5-5 mg by mouth See admin instructions. TAKE 1/2 TABLET TO 1 TABLET BY MOUTH DAILY AS DIRECTED BY COUMADIN CLINIC; 2.5 mg Mon Wed Fri; 5 mg all other days 11/11/22   Chilton Si, MD     Critical care time: 60 minutes    Critical care time: 60 minutes.  The treatment and management of the patient's condition was required based on the threat of imminent deterioration. This time reflects time spent by the physician evaluating, providing care and managing the critically ill patient's care. The time was spent at the immediate bedside (or on the same floor/unit and dedicated to this patient's care). Time involved in separately billable procedures is NOT included int he critical care time indicated above. Family meeting and update time may be included above if and only if the patient is unable/incompetent to participate in clinical interview and/or decision making, and the discussion was necessary to determining treatment decisions.  Marcelle Smiling, MD Board Certified by the ABIM, Pulmonary Diseases & Critical Care Medicine

## 2023-02-03 NOTE — ED Provider Notes (Signed)
[place] EMERGENCY DEPARTMENT AT [facility] Provider Note   CSN: [id number] Arrival date & time: 02/03/23  1213     History  Chief Complaint  Patient presents with   Shortness of Breath    Gary [name] is a 78 y.o. male with h/o Wegener's granulomatosis, ILD, on 2-3L [place] at baseline, CHF, OSA on CPAP w/ 2L [place], Afib, ascending aortic aneurysm, who presents by EMS from his pulmonologist doctor's office with c/o exertional shortness of breath that started this morning. EMS arrived to the doctors office and he was on 15L [place], sats 100%. Reports he's been on long prednisone taper and had rituximab infusion this week for his Wegener's. This AM got out of bed, took off his CPAP he uses and his O2 went down to 40%. Made it from his bathroom to his office where he turned to O2 to 6L [place]. Didn't fall but became very weak. Took 2-3 hours for him to increase into upper 80s%. Had his pulmonology appt this AM who sent him to ED. Denies chest pain, nausea/vomiting, flu-like symptoms, increased leg swelling. Cough is about the same as it always is. Feels very worn out right now.    During interview, patient is noted to desat to 93% from 99% on 6L [place].   Per chart review, was admitted in early 2025 for hypoxic resp failure, had RHC which showed mild pulm HTN. Thought to have ILD flare, possibly CHF exacerbation as well, was placed on prednisone taper per pulm.  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 Authorizing Provider  ALPRAZolam Prudy Feeler) 0.5 MG tablet Take 0.5 mg by mouth at bedtime. Takes at 11:00pm    [provider]  aspirin 81 MG chewable tablet Chew 81 mg by mouth daily.    [provider]  Calcium Carb-Cholecalciferol (CALCIUM 600/VITAMIN D3 PO) Take 1 tablet by mouth daily.    [provider]  carvedilol (COREG) 25 MG tablet Take 0.5 tablets (12.5 mg total) by mouth 2 (two) times daily. Patient taking differently: Take 12.5 mg by mouth 2 (two) times daily. Hold dose if SBP <110. 03/06/22   Zannie Cove, MD  Cholecalciferol (VITAMIN D-3) 125 MCG (5000 UT) TABS Take 10,000 Units by mouth daily.    [provider]  Coenzyme Q10 (COQ10 PO) Take 400 mg by mouth daily.    [provider]  dapsone 100 MG tablet Take 1 tablet (100 mg total) by mouth daily. 01/07/23   Hunsucker, Lesia Sago, MD  dofetilide (TIKOSYN) 500 MCG capsule TAKE 1 CAPSULE BY MOUTH 2 TIMES DAILY. 11/02/22   Eustace Pen, PA-C  Evolocumab (REPATHA SURECLICK) 140 MG/ML SOAJ INJECT 140 MG INTO THE SKIN EVERY 14 (FOURTEEN) DAYS. INJECT 1 PEN INTO THE FATTY TISSUE SKIN EVERY 14 DAY 05/06/22   Chilton Si, MD  ferrous sulfate 325 (65 FE) MG EC tablet Take 325 mg by mouth daily with breakfast.    [provider]  furosemide (LASIX) 40 MG tablet Take 1 tablet (40 mg total) by mouth daily. 01/07/23   Zannie Cove, MD  levothyroxine (SYNTHROID, LEVOTHROID) 137 MCG tablet Take 137 mcg by mouth daily before breakfast. 10/27/14   [provider]  loratadine (CLARITIN) 10 MG tablet Take 10 mg by mouth daily.    [provider]  metFORMIN (GLUCOPHAGE) 500 MG tablet Take 500 mg by mouth 2 (two) times daily. 10/27/17   [provider]  Multiple Vitamins-Minerals (CENTRUM SILVER 50+MEN) TABS Take 1 tablet by mouth daily.    [provider]   nitroGLYCERIN (NITROSTAT) 0.4 MG SL tablet Place 1 tablet (0.4 mg total) under the tongue every 5 (five) minutes x 3 doses as needed for chest pain. 09/29/21   Jonita Albee, PA-C  pantoprazole (PROTONIX) 40 MG tablet TAKE 1 TABLET BY MOUTH 2 (TWO) TIMES DAILY FOR 7 DAYS, THEN 1 TABLET (40 MG TOTAL) DAILY. 01/13/23   Alver Sorrow, NP  Pirfenidone 267 MG TABS Take 1 tab three times daily for 7 days, then 2 tabs three times daily for 7 days, then 3 tabs three times daily thereafter. 01/18/23   Kalman Shan, MD  potassium chloride SA (KLOR-CON M) 20 MEQ tablet Take 1 tablet (20 mEq total) by mouth daily. 01/07/23   Zannie Cove, MD  predniSONE (DELTASONE) 20 MG tablet Take 60 mg (3 tabs) daily for 2 weeks, cut down to 40 mg (2 tabs) daily from 1/17 01/07/23   Zannie Cove, MD  riTUXimab (RITUXAN IV) Inject 1 application  into the vein See admin instructions. Take every two weeks and every six months alternating    [provider]  Semaglutide (OZEMPIC, 0.25 OR 0.5 MG/DOSE, Alpha) Inject 0.5 mg into the skin once a week.    [provider]  testosterone cypionate (DEPOTESTOSTERONE CYPIONATE) 200 MG/ML injection Inject 150 mg into the muscle every 14 (fourteen) days.    [provider]  venlafaxine XR (EFFEXOR-XR) 150 MG 24 hr capsule Take 150 mg by mouth daily.    [provider]  warfarin (COUMADIN) 5 MG tablet TAKE 1/2 TABLET TO 1 TABLET BY MOUTH DAILY AS DIRECTED BY COUMADIN CLINIC Patient taking differently: Take 2.5-5 mg by mouth See admin instructions. TAKE 1/2 TABLET TO 1 TABLET BY MOUTH DAILY AS DIRECTED BY COUMADIN CLINIC; 2.5 mg Mon Wed Fri; 5 mg all other days 11/11/22   Chilton Si, MD      Allergies    Marcelline Deist [dapagliflozin] and Statins    Review of Systems   Review of Systems A 10 point review of systems was performed and is negative unless otherwise reported in HPI.  Physical Exam Updated Vital Signs BP (!) 106/52 (BP Location:  Right Arm)   Pulse 74   Temp 98 F (36.7 C) (Oral)   Resp 19   Ht 5\' 9"  (1.753 m)   Wt 94.8 kg   SpO2 95%   BMI 30.86 kg/m  Physical Exam General: Normal appearing male, lying in bed.  HEENT: PERRLA, Sclera anicteric, MMM, trachea midline.  Cardiology: RRR, no murmurs/rubs/gallops. BL radial and DP pulses equal bilaterally.  Resp: Satting 93-99% on 6L [place]. Normal respiratory rate and effort at rest. Mildly increased WOB with talking. CTAB, no wheezes, rhonchi, crackles.  Abd: Soft, non-tender, non-distended. No rebound tenderness or guarding.  GU: Deferred. MSK: No peripheral  edema or signs of trauma. Extremities without deformity or TTP. No cyanosis or clubbing. Skin: warm, dry. No rashes or lesions. Back: No CVA tenderness Neuro: A&Ox4, CNs II-XII grossly intact. MAEs. Sensation grossly intact.  Psych: Normal mood and affect.   ED Results / Procedures / Treatments   Labs (all labs ordered are listed, but only abnormal results are displayed) Labs Reviewed  RESP PANEL BY RT-PCR (RSV, FLU A&B, COVID)  [id number]  CBC WITH DIFFERENTIAL/PLATELET  COMPREHENSIVE METABOLIC PANEL  BRAIN NATRIURETIC PEPTIDE    EKG EKG Interpretation Date/Time:  Thursday February 03 2023 12:30:23 EST Ventricular Rate:  70 PR Interval:  149 QRS Duration:  117 QT Interval:  416 QTC Calculation: 449 R Axis:   -28  Text Interpretation: Sinus rhythm LVH with IVCD and secondary repol abnrm Similar to prior EKG Confirmed by Vivi Barrack [phone]) on 02/03/2023 12:40:34 PM  Radiology No results found.  Procedures Procedures    Medications Ordered in ED Medications  furosemide (LASIX) injection 40 mg (40 mg Intravenous Given 02/03/23 1721)    ED Course/ Medical Decision Making/ A&P                          Medical Decision Making Amount and/or Complexity of Data Reviewed Labs: ordered. Decision-making details documented in ED Course. Radiology: ordered. Decision-making details documented in ED  Course.  Risk Prescription drug management. Decision regarding hospitalization.    This patient presents to the ED for concern of SOB/weakness, this involves an extensive number of treatment options, and is a complaint that carries with it a high risk of complications and morbidity.  I considered the following differential and admission for this acute, potentially life threatening condition.   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 Consider possible ILD flare, pulmonary edema/CHF exacerbation, PNA. He has no wheezing to suggest obstructive lung disease. Possible superimposed edema on chronic interstitial lung disease w/ possible worsening, will trial lasix.   Clinical Course as of 02/05/23 0901  Thu Feb 03, 2023  1412 WBC(!): 13.8 Increasing leukocytosis, but also on prednisone [HN]  1412 Hemoglobin(!): 8.7 Mildly decreased from baseline ~9.3 [HN]  1412 Comprehensive metabolic panel(!) No significant change from prior [HN]  1412 Troponin I (High Sensitivity)(!): 31 [HN]  1413 DG Chest Portable 1 View Chronic interstitial lung disease with possible slight worsening on the right which could indicate superimposed edema or inflammation.   [HN]  1439 D/w hospitalist who requests pulmonology consult. [HN]    Clinical Course User Index [HN] Loetta Rough, MD    Labs: I Ordered, and personally interpreted labs.  The pertinent results include:  those listed above  Imaging Studies ordered: I ordered imaging studies including CXR I independently visualized and interpreted imaging. I agree with the radiologist interpretation  Additional history obtained from chart review.    Cardiac Monitoring: The patient was maintained on a cardiac monitor.  I personally viewed and interpreted the cardiac monitored which showed an underlying rhythm  of: NSR  Reevaluation: After the interventions noted above, I reevaluated the patient and found that they have :stayed the same  Social Determinants of Health: Lives independently  Disposition:  Pulm aware and will come to see patient. Patient is admitted to hospitalist  Co morbidities that complicate the patient evaluation  Past Medical History:  Diagnosis  Date   Aortic aneurysm (HCC) 02/20/2016   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   Arthritis    Atrial fibrillation (HCC) 11/13/2014   Cancer (HCC)    skin   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/2016   LVEF 45-50% 11/2014   Coronary artery disease    Depression    Essential hypertension 11/13/2014   Hyperlipidemia 11/13/2014   ILD (interstitial lung disease) (HCC)    Sleep apnea    CPAP 'Uses half the time"  last study 2009   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     Medicines No orders of the defined types were placed in this encounter.   I have reviewed the patients home medicines and have made adjustments as needed  Problem List / ED Course: Problem List Items Addressed This Visit       Respiratory   Acute hypoxemic respiratory failure (HCC) - Primary                This note was created using dictation software, which may contain spelling or grammatical errors.    Loetta Rough, MD 02/05/23 [phone]

## 2023-02-03 NOTE — Progress Notes (Signed)
@Patient  ID: Gary [name], male    DOB: 1945/10/24, 78 y.o.   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 ) Medical history significant for DM, OSA on CPAP, A Fib on coumadin, CHF, CAD   TEST/EVENTS :  echo 12/24 with EF 65%, grade 1 DD, normal RV, Pulm HTN RVSP 59  -RHC 12/31 with normal filling pressures, mild PAH   02/03/2023 Follow up : Post hospital follow up , ILD , Wegener's  Patient presents for a follow up visit . Recently readmitted to hospital.  For acute on chronic hypoxic respiratory failure in the setting of interstitial lung disease flare.  Patient was placed on high-dose steroids.  Underwent a right heart cath on December 31 that showed normal filling pressures, mild pulmonary hypertension and preserved cardiac output.  Patient was discharged on prednisone 60 mg daily.  He is currently on prednisone 40 mg daily.  He also follows with rheumatology and is on Rituxan.  Baseline oxygen is 3 L at rest and 6 L with ambulation.  Patient presents today in respiratory distress.  Says that this morning when he got up to go to the bathroom that he felt very short of breath weak with a near syncopal episode.  Patient checked his oxygen levels and was at 40%.  Patient spouse had to help him get dressed.  He turned his oxygen up to 6 L but continued to feel very short of breath.  She brought him straight to the office this morning.  On arrival patient's O2 saturations were 72% on 6 L of oxygen on pulsing oxygen device.  Will change patient over to continuous flow at 10 L with O2 saturations at 88 to 90%.  Patient did have significant desaturations into the low to mid 80s with talking.  He was changed over to a nonrebreather and was able to maintain O2 saturations at  95 to 98%.  Wife says that patient was very confused this morning after his episode of shortness of breath walking to the bathroom.  Patient says he has had a very stressful week.  He had to have an iron infusion a few days ago.  Patient says he has been having blood in his stools.  He is on Coumadin for his chronic A-fib.  INR on January 20 was 6.7.  Patient says his hemoglobin had dropped and he received iron infusion this week.  Last hemoglobin reported by patient was 8.2.  INR on February 01, 2023 is 2.5.  Patient says he also received his Rituxan this week.  Patient says his breathing has been getting progressively worse.  More short of breath with activities.  Patient denies any hemoptysis, chest pain, abdominal pain, increased leg swelling, calf pain, nausea vomiting or diarrhea. In the office patient is coherent with no obvious confusion.  Heart rate is 75-78.  Blood pressure is stable. Patient has sleep apnea is on CPAP at bedtime endorses compliance.  Allergies  Allergen Reactions   Farxiga [Dapagliflozin] Other (See Comments)    Lightheadedness Low blood pressure   Statins Other (See Comments)    Myalgias     Immunization History  Administered Date(s) Administered   Influenza, High Dose Seasonal PF 10/05/2022   Influenza-Unspecified 11/01/2018   PFIZER(Purple Top)SARS-COV-2 Vaccination 02/10/2019, 03/03/2019, 09/06/2019  PNEUMOCOCCAL CONJUGATE-20 01/07/2023   Pfizer Covid-19 Theatre manager 97yrs & up 09/14/2020   Pneumococcal Polysaccharide-23 01/04/2012    Past Medical History:  Diagnosis Date   Aortic aneurysm (HCC) 02/20/2016   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   Arthritis    Atrial fibrillation (HCC) 11/13/2014   Cancer (HCC)    skin   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5 MG tablet Take 0.5 mg by mouth at bedtime. Takes at 11:00pm     aspirin 81 MG chewable tablet Chew 81 mg by mouth daily.     Calcium Carb-Cholecalciferol (CALCIUM 600/VITAMIN D3 PO) Take 1 tablet by mouth daily.     carvedilol (COREG) 25 MG tablet Take 0.5 tablets (12.5 mg total) by mouth 2 (two) times daily. (Patient taking differently: Take 12.5 mg by mouth 2 (two) times daily. Hold dose if SBP <110.)     Cholecalciferol (VITAMIN D-3) 125 MCG (5000 UT) TABS Take 10,000 Units by mouth daily.     Coenzyme Q10 (COQ10 PO) Take 400 mg by mouth daily.     dapsone 100 MG tablet Take 1 tablet (100 mg total) by mouth daily. 30 tablet 1   dofetilide (TIKOSYN) 500 MCG capsule TAKE 1 CAPSULE BY MOUTH 2 TIMES DAILY. 180 capsule 1   Evolocumab (REPATHA SURECLICK) 140 MG/ML SOAJ INJECT 140 MG INTO THE SKIN EVERY 14 (FOURTEEN) DAYS. INJECT 1 PEN INTO THE FATTY TISSUE SKIN EVERY 14 DAY 6 mL 3   ferrous sulfate 325 (65 FE) MG EC tablet Take 325 mg by mouth daily with breakfast.     furosemide (LASIX) 40 MG tablet Take 1 tablet (40 mg total) by mouth daily. 30 tablet 1   levothyroxine (SYNTHROID, LEVOTHROID) 137 MCG tablet Take 137 mcg by mouth daily before breakfast.     loratadine (CLARITIN) 10 MG tablet Take 10 mg by mouth daily.     metFORMIN (GLUCOPHAGE) 500 MG tablet Take 500 mg by mouth 2 (two) times daily.  12   Multiple Vitamins-Minerals (CENTRUM SILVER 50+MEN) TABS Take 1 tablet by mouth daily.     nitroGLYCERIN (NITROSTAT) 0.4 MG SL tablet Place 1 tablet  (0.4 mg total) under the tongue every 5 (five) minutes x 3 doses as needed for chest pain. 25 tablet 12   pantoprazole (PROTONIX) 40 MG tablet TAKE 1 TABLET BY MOUTH 2 (TWO) TIMES DAILY FOR 7 DAYS, THEN 1 TABLET (40 MG TOTAL) DAILY. 90 tablet 3   Pirfenidone 267 MG TABS Take 1 tab three times daily for 7 days, then 2 tabs three times daily for 7 days, then 3 tabs three times daily thereafter. 270 tablet 0   potassium chloride SA (KLOR-CON M) 20 MEQ tablet Take 1 tablet (20 mEq total) by mouth daily. 30 tablet 1   predniSONE (DELTASONE) 20 MG tablet  Take 60 mg (3 tabs) daily for 2 weeks, cut down to 40 mg (2 tabs) daily from 1/17 180 tablet 1   riTUXimab (RITUXAN IV) Inject 1 application  into the vein See admin instructions. Take every two weeks and every six months alternating     Semaglutide (OZEMPIC, 0.25 OR 0.5 MG/DOSE, [place]) Inject 0.5 mg into the skin once a week.     testosterone cypionate (DEPOTESTOSTERONE CYPIONATE) 200 MG/ML injection Inject 150 mg into the muscle every 14 (fourteen) days.     venlafaxine XR (EFFEXOR-XR) 150 MG 24 hr capsule Take 150 mg by mouth daily.     warfarin (COUMADIN) 5 MG tablet TAKE 1/2 TABLET TO 1 TABLET BY MOUTH DAILY AS DIRECTED BY COUMADIN CLINIC (Patient taking differently: Take 2.5-5 mg by mouth See admin instructions. TAKE 1/2 TABLET TO 1 TABLET BY MOUTH DAILY AS DIRECTED BY COUMADIN CLINIC; 2.5 mg Mon Wed Fri; 5 mg all other days) 70 tablet 1   No facility-administered medications prior to visit.     Review of Systems:   Constitutional:   No  weight loss, night sweats,  Fevers, chills, +fatigue, or  lassitude.  HEENT:   No headaches,  Difficulty swallowing,  Tooth/dental problems, or  Sore throat,                No sneezing, itching, ear ache, nasal congestion, post nasal drip,   CV:  No chest pain,  Orthopnea, PND, swelling in lower extremities, anasarca, dizziness, palpitations, syncope.   GI  No heartburn, indigestion, abdominal pain, nausea,  vomiting, diarrhea, change in bowel habits, loss of appetite, +bloody stools.   Resp:   No chest wall deformity  Skin: no rash or lesions.  GU: no dysuria, change in color of urine, no urgency or frequency.  No flank pain, no hematuria   MS:  No joint pain or swelling.  No decreased range of motion.  No back pain.    Physical Exam   GEN: A/Ox3; pleasant , NAD, well nourished , chronically ill-appearing, pale, on oxygen, wheelchair, respiratory distress   HEENT:  /AT,  NOSE-clear, THROAT-clear, no lesions, no postnasal drip or exudate noted.   NECK:  Supple w/ fair ROM; no JVD; normal carotid impulses w/o bruits; no thyromegaly or nodules palpated; no lymphadenopathy.    RESP bilateral crackles   no dullness to percussion, increased work of breathing  CARD:  RRR, no m/r/g, no peripheral edema, pulses intact, no cyanosis or clubbing.  GI:   Soft & nt; nml bowel sounds; no organomegaly or masses detected.   Musco: Warm bil, no deformities or joint swelling noted.   Neuro: alert, no focal deficits noted.    Skin: Warm, no lesions or rashes    Lab Results:  CBC    Component Value Date/Time   WBC 12.8 (H) 01/08/2023 0220   RBC 3.91 (L) 01/08/2023 0220   HGB 9.3 (L) 01/08/2023 0220   HGB 9.3 (L) 01/07/2023 0248   HCT 30.9 (L) 01/08/2023 0220   HCT 34.5 (L) 02/25/2022 1444   PLT 535 (H) 01/08/2023 0220   PLT 434 02/25/2022 1444   MCV 79.0 (L) 01/08/2023 0220   MCV 75 (L) 02/25/2022 1444   MCH 23.8 (L) 01/08/2023 0220   MCHC 30.1 01/08/2023 0220   RDW 17.7 (H) 01/08/2023 0220   RDW 19.3 (H) 02/25/2022 1444   LYMPHSABS 0.4 (L) 01/03/2023 0245   LYMPHSABS 0.5 (L) 02/25/2022 1444   MONOABS 0.7 01/03/2023 0245   EOSABS  0.0 01/03/2023 0245   EOSABS 0.0 02/25/2022 1444   BASOSABS 0.0 01/03/2023 0245   BASOSABS 0.0 02/25/2022 1444    BMET    Component Value Date/Time   NA 140 01/08/2023 0220   NA 141 02/25/2022 1444   K 3.8 01/08/2023 0220   CL 101 01/08/2023  0220   CO2 29 01/08/2023 0220   GLUCOSE 134 (H) 01/08/2023 0220   BUN 39 (H) 01/08/2023 0220   BUN 19 02/25/2022 1444   CREATININE 1.04 01/08/2023 0220   CREATININE 0.80 02/19/2015 0821   CALCIUM 8.8 (L) 01/08/2023 0220   GFRNONAA >60 01/08/2023 0220   GFRAA 101 04/19/2019 1216    BNP    Component Value Date/Time   BNP 255.9 (H) 01/04/2023 0255   BNP 242.6 (H) 11/13/2014 0918    ProBNP    Component Value Date/Time   PROBNP 416.0 (H) 11/04/2022 1519    Imaging: CT Chest High Resolution Result Date: 01/07/2023 CLINICAL DATA:  Inpatient. Diffuse/interstitial lung disease ild flare EXAM: CT CHEST WITHOUT CONTRAST TECHNIQUE: Multidetector CT imaging of the chest was performed following the standard protocol without intravenous contrast. High resolution imaging of the lungs, as well as inspiratory and expiratory imaging, was performed. RADIATION DOSE REDUCTION: This exam was performed according to the departmental dose-optimization program which includes automated exposure control, adjustment of the mA and/or kV according to patient size and/or use of iterative reconstruction technique. COMPARISON:  01/05/2023 chest radiograph. 11/05/2022 high-resolution chest CT. FINDINGS: Cardiovascular: Stable mild cardiomegaly. No significant pericardial effusion/thickening. Three-vessel coronary atherosclerosis status post CABG. Atherosclerotic nonaneurysmal thoracic aorta. Normal caliber pulmonary arteries. Mediastinum/Nodes: No significant thyroid nodules. Unremarkable esophagus. No axillary adenopathy. Mild right paratracheal adenopathy up to 1.2 cm (series 5/image 45), unchanged. No new pathologically enlarged mediastinal nodes. No discrete hilar adenopathy on these noncontrast images. Lungs/Pleura: No pneumothorax. No pleural effusion. No acute consolidative airspace disease, lung masses or significant pulmonary nodules. Stable mild patchy air trapping in both lungs on the expiration sequence, without  evidence of tracheobronchomalacia. Extensive patchy peribronchovascular and subpleural reticulation and ground-glass opacity throughout both lungs with associated mild traction bronchiectasis and architectural distortion. No clear apicobasilar gradient to these findings. Scattered foci of mild honeycombing in both lungs, for example in the peripheral right lung on series 8/image 80 and in the peripheral lower left lung on series 8/image 98). Ground-glass opacities have worsened since 11/05/2022 chest CT. Findings are otherwise not appreciably changed. Upper abdomen: Cholelithiasis. Musculoskeletal: No aggressive appearing focal osseous lesions. Intact sternotomy wires. Moderate thoracic spondylosis. IMPRESSION: 1. Spectrum of findings compatible with fibrotic interstitial lung disease with mild honeycombing, with worsened diffuse patchy ground-glass opacity in the interval since 11/05/2022 CT. Acute flare of chronic fibrotic interstitial lung disease favored. As previously described, chronic hypersensitivity pneumonitis or fibrotic NSIP are favored. Findings are suggestive of an alternative diagnosis (not UIP) per consensus guidelines: Diagnosis of Idiopathic Pulmonary Fibrosis: An Official ATS/ERS/JRS/ALAT Clinical Practice Guideline. Am Rosezetta Schlatter Crit Care Med Vol 198, Iss 5, [phone], Sep 04 2016. 2. Stable mild cardiomegaly. 3. Cholelithiasis. 4.  Aortic Atherosclerosis (ICD10-I70.0). Electronically Signed   By: Delbert Phenix M.D.   On: 01/07/2023 10:28   VAS [place] LOWER EXTREMITY VENOUS (DVT) Result Date: 01/05/2023  Lower Venous DVT Study Patient Name:  DEVONE TOUSLEY [facility]  Date of Exam:   01/05/2023 Medical Rec #: [id number]          Accession #:    [id number] Date of Birth: [birth year]  Patient Gender: M Patient Age:   76 years Exam Location:  [facility] Procedure:      VAS [place] LOWER EXTREMITY VENOUS (DVT) Referring Phys: Levon Hedger  --------------------------------------------------------------------------------  Indications: Edema.  Anticoagulation: Coumadin. Limitations: Poor ultrasound/tissue interface. Performing Technologist: Ernestene Mention RVT, RDMS  Examination Guidelines: A complete evaluation includes B-mode imaging, spectral Doppler, color Doppler, and power Doppler as needed of all accessible portions of each vessel. Bilateral testing is considered an integral part of a complete examination. Limited examinations for reoccurring indications may be performed as noted. The reflux portion of the exam is performed with the patient in reverse Trendelenburg.  +--------+---------------+---------+-----------+----------+--------------------+ RIGHT   CompressibilityPhasicitySpontaneityPropertiesThrombus Aging       +--------+---------------+---------+-----------+----------+--------------------+ CFV     Full           Yes      Yes                                       +--------+---------------+---------+-----------+----------+--------------------+ SFJ     Full                                                              +--------+---------------+---------+-----------+----------+--------------------+ FV Prox Full           Yes      Yes                                       +--------+---------------+---------+-----------+----------+--------------------+ FV Mid  Full           Yes      Yes                                       +--------+---------------+---------+-----------+----------+--------------------+ FV                     Yes      Yes                  patent by            Distal                                               color/doppler        +--------+---------------+---------+-----------+----------+--------------------+ PFV     Full                                                              +--------+---------------+---------+-----------+----------+--------------------+ POP     Full            Yes      Yes                                       +--------+---------------+---------+-----------+----------+--------------------+  PTV     Full                                                              +--------+---------------+---------+-----------+----------+--------------------+ PERO    Full                                                              +--------+---------------+---------+-----------+----------+--------------------+   +--------+---------------+---------+-----------+----------+--------------------+ LEFT    CompressibilityPhasicitySpontaneityPropertiesThrombus Aging       +--------+---------------+---------+-----------+----------+--------------------+ CFV     Full           Yes      Yes                                       +--------+---------------+---------+-----------+----------+--------------------+ SFJ     Full                                                              +--------+---------------+---------+-----------+----------+--------------------+ FV Prox Full           Yes      Yes                                       +--------+---------------+---------+-----------+----------+--------------------+ FV Mid  Full           Yes      Yes                                       +--------+---------------+---------+-----------+----------+--------------------+ FV                     Yes      Yes                  patent by            Distal                                               color/doppler        +--------+---------------+---------+-----------+----------+--------------------+ PFV     Full                                                              +--------+---------------+---------+-----------+----------+--------------------+ POP     Full           Yes  Yes                                       +--------+---------------+---------+-----------+----------+--------------------+ PTV     Full                                                               +--------+---------------+---------+-----------+----------+--------------------+ PERO    Full                                                              +--------+---------------+---------+-----------+----------+--------------------+     Summary: BILATERAL: - No evidence of deep vein thrombosis seen in the lower extremities, bilaterally. -No evidence of popliteal cyst, bilaterally.   *See table(s) above for measurements and observations. Electronically signed by Lemar Livings MD on 01/05/2023 at 5:34:57 PM.    Final    DG Chest Port 1 View Result Date: 01/05/2023 CLINICAL DATA:  Acute respiratory failure. History of hypertension and coronary artery disease. EXAM: PORTABLE CHEST 1 VIEW COMPARISON:  01/01/2023 FINDINGS: Previous median sternotomy and CABG procedure. Lung volumes are low. Diffuse increased reticular and nodular interstitial opacities within both lungs compatible with previously characterized chronic fibrotic no superimposed pleural effusion, interstitial edema or airspace consolidation. Interstitial lung disease IMPRESSION: 1. No acute findings. 2. Chronic interstitial lung disease. Electronically Signed   By: Signa Kell M.D.   On: 01/05/2023 12:24    Administration History     None          Latest Ref Rng & Units 11/15/2022    3:23 PM  PFT Results  FVC-Pre L 2.57   FVC-Predicted Pre % 67   FVC-Post L 2.74   FVC-Predicted Post % 72   Pre FEV1/FVC % % 86   Post FEV1/FCV % % 87   FEV1-Pre L 2.22   FEV1-Predicted Pre % 82   FEV1-Post L 2.38   DLCO uncorrected ml/min/mmHg 13.57   DLCO UNC% % 58   DLCO corrected ml/min/mmHg 15.33   DLCO COR %Predicted % 65   DLVA Predicted % 100   TLC L 4.01   TLC % Predicted % 59   RV % Predicted % 45     No results found for: "NITRICOXIDE"      Assessment & Plan:   Acute respiratory failure with hypoxia (HCC) Acute on chronic hypoxic respiratory failure in  the setting progressive ILD. Patient is a very complicated case with underlying Wegener's granulomatosis related ILD.  Patient has had progressive decline over the last several months.  Now with requiring increased oxygen questionable etiology.  He also has possible underlying GI bleed with bloody stools, symptomatic anemia on Coumadin -supratherapeutic recently. Also received iron infusion and Rituxan this week.  Will need further evaluation.  EMS was called report was given patient in stable condition on nonrebreather with O2 saturations at 95%. Patient to be transported to the emergency room for further evaluation and treatment-most likely will need hospital admission.      Rubye Oaks, NP 02/03/2023

## 2023-02-03 NOTE — Progress Notes (Signed)
PHARMACY - ANTICOAGULATION CONSULT NOTE  Pharmacy Consult for Heparin Indication: atrial fibrillation  Allergies  Allergen Reactions   Farxiga [Dapagliflozin] Other (See Comments)    Lightheadedness Low blood pressure   Statins Other (See Comments)    Myalgias     Patient Measurements: Height: 5\' 9"  (175.3 cm) Weight: 94.8 kg (209 lb) IBW/kg (Calculated) : 70.7 Heparin Dosing Weight: 90.3kg  Vital Signs: Temp: 98 F (36.7 C) (01/30 1226) Temp Source: Oral (01/30 1226) BP: 106/52 (01/30 1226) Pulse Rate: 74 (01/30 1226)  Labs: Recent Labs    02/01/23 0000 02/03/23 1304  HGB  --  8.7*  HCT  --  30.1*  PLT  --  341  LABPROT  --  29.2*  INR 2.50* 2.7*  CREATININE  --  1.04  TROPONINIHS  --  31*    Estimated Creatinine Clearance: 65.4 mL/min (by C-G formula based on SCr of 1.04 mg/dL).   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    Medications:  Scheduled:   ALPRAZolam  0.5 mg Oral QHS   aspirin  81 mg Oral Daily   clopidogrel  75 mg Oral Daily   dapsone  100 mg Oral Daily   dofetilide  500 mcg Oral BID   furosemide  40 mg Intravenous Once   insulin aspart  0-5 Units Subcutaneous QHS   insulin aspart  0-9 Units Subcutaneous TID WC   [START ON 02/04/2023] levothyroxine  137 mcg Oral QAC breakfast   methylPREDNISolone (SOLU-MEDROL) injection  60 mg Intravenous Q24H   pantoprazole  40 mg Oral Daily   venlafaxine XR  150 mg Oral Daily   Infusions:    Assessment: Gary [name] with history of atrial fibrillation on warfarin who presents with SOB. Last warfarin dose PTA was 1/29. Patient reports dark stools PTA. Due to concern for GIB, pharmacy has been consulted to start heparin drip once INR < 2.  INR 2.7, will hold off on starting heparin at this time. Hgb 8.7, plt 341.  Goal of Therapy:  Heparin level 0.3-0.7 units/ml Monitor platelets by anticoagulation protocol: Yes   Plan:  Hold off on starting heparin gtt F/u INR in AM Monitor daily CBC and s/sx of bleeding  Ginette Otto Nihal Doan 02/03/2023,3:46 PM

## 2023-02-03 NOTE — H&P (Signed)
TRH H&P   Patient Demographics:    Gary [name], is a 78 y.o. male  MRN: [id number]   DOB - [birth year]  Admit Date - 02/03/2023  Outpatient Primary MD for the patient is Charlane Ferretti, DO  Outpatient Specialists: Dr. [facility]  Patient coming from: Home >> PCCM office  Chief Complaint  Patient presents with   Shortness of Breath      HPI:    Gary [name]  is a 78 y.o. 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  Patient with above history who was recently admitted to the hospital for an ILD flare requiring hospitalization, was currently on a steroid taper and presently on 40 mg of prednisone for the last 2 weeks, patient was doing fairly well till yesterday.  He woke up to go to the bathroom and felt short of breath, he checked his oxygen levels and his pulse ox was in 40s, he put himself to 6 L nasal cannula oxygen and took him about 2 hours to get his pulse ox into 90s, he subsequently went to his PCCM MD's office where he was evaluated and requested to go to the ER for further evaluation.  In the ER he is currently on 6 L nasal cannula oxygen but in no distress pulse ox in high 90s, exam is relatively unremarkable and so is his blood work, his Flu  testing in the physician's office was negative.  I was called to admit the patient for acute  on chronic hypoxic respiratory failure evaluation and management.  Patient presently denies any headache, no preceding fever or chills, no productive cough, no exposure to sick contacts, no orthopnea, no weight gain or swelling in lower extremities, no abdominal pain, he does have darker than usual stools for the last several weeks for which she recently required IV iron transfusion and now on oral iron replacement, no focal weakness.   Review of systems:    A full 10 point Review of Systems was done, except as stated above, all  other Review of Systems were negative.   With Past History of the following :    Past Medical History:  Diagnosis Date   Aortic aneurysm (HCC) 02/20/2016   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   Arthritis    Atrial fibrillation (HCC) 11/13/2014   Cancer (HCC)    skin   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/2016   LVEF 45-50% 11/2014   Coronary artery disease    Depression    Essential hypertension 11/13/2014   Hyperlipidemia 11/13/2014   ILD (interstitial lung disease) (HCC)    Sleep apnea    CPAP 'Uses half the time"  last study 2009   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      Past Surgical History:  Procedure Laterality Date   CARDIOVERSION N/A 12/06/2014   Procedure: CARDIOVERSION;  Surgeon: Chilton Si, MD;  Location: [facility] ENDOSCOPY;  Service: Cardiovascular;  Laterality: N/A;   CARDIOVERSION N/A 05/26/2017   Procedure: CARDIOVERSION;  Surgeon: Chilton Si, MD;  Location: [facility] ENDOSCOPY;  Service: Cardiovascular;  Laterality: N/A;   CARDIOVERSION N/A 04/25/2019   Procedure: CARDIOVERSION;  Surgeon: Thurmon Fair, MD;  Location: [facility] ENDOSCOPY;  Service: Cardiovascular;  Laterality: N/A;   CARDIOVERSION N/A 02/26/2022   Procedure: CARDIOVERSION;  Surgeon: Chilton Si, MD;  Location: [facility] ENDOSCOPY;  Service: Cardiovascular;  Laterality: N/A;   CARDIOVERSION N/A 05/13/2022   Procedure: CARDIOVERSION;  Surgeon: Quintella Reichert, MD;  Location: [facility] INVASIVE CV LAB;  Service: Cardiovascular;  Laterality: N/A;   CARPAL TUNNEL RELEASE     CORONARY ANGIOPLASTY     multiple stents   CORONARY ARTERY BYPASS GRAFT  2006   CORONARY STENT INTERVENTION N/A 09/28/2021   Procedure: CORONARY STENT INTERVENTION;  Surgeon: Runell Gess, MD;  Location: [facility] INVASIVE CV LAB;  Service: Cardiovascular;  Laterality: N/A;  SVG-PDA   EYE SURGERY     right eye-  muscular repair   LEFT HEART CATH AND CORS/GRAFTS ANGIOGRAPHY N/A 09/28/2021   Procedure: LEFT HEART CATH AND CORS/GRAFTS ANGIOGRAPHY;  Surgeon: Runell Gess, MD;  Location: [facility] INVASIVE CV LAB;  Service: Cardiovascular;  Laterality: N/A;   RIGHT HEART CATH N/A 01/04/2023   Procedure: RIGHT HEART CATH;  Surgeon: Laurey Morale, MD;  Location: [facility] INVASIVE CV LAB;  Service: Cardiovascular;  Laterality: N/A;   TOTAL HIP ARTHROPLASTY  01/03/2012   Procedure: TOTAL HIP ARTHROPLASTY;  Surgeon: Jacki Cones, MD;  Location: [facility] ORS;  Service: Orthopedics;  Laterality: Right;      Social History:     Social History   Tobacco Use   Smoking status: Former    Current packs/day: 0.00    Types: Cigarettes    Quit date: 12/24/1983    Years since quitting: 39.1   Smokeless tobacco: Never  Substance Use Topics   Alcohol use: No    Alcohol/week: 0.0 standard drinks of alcohol       Family History :     Family History  Problem Relation Age of Onset   Heart attack Mother    Other Father        odd stomach disorder   Stroke Maternal Grandmother    Heart attack Maternal Grandfather    Heart attack Paternal Grandmother    Heart attack Paternal Grandfather      Home Medications:   Prior to Admission medications   Medication Sig Start Date End Date Taking? Authorizing Provider  ALPRAZolam Prudy Feeler) 0.5 MG tablet Take 0.5 mg by mouth at bedtime. Takes at 11:00pm  [provider]  aspirin 81 MG chewable tablet Chew 81 mg by mouth daily.    [provider]  Calcium Carb-Cholecalciferol (CALCIUM 600/VITAMIN D3 PO) Take 1 tablet by mouth daily.    [provider]  carvedilol (COREG) 25 MG tablet Take 0.5 tablets (12.5 mg total) by mouth 2 (two) times daily. Patient taking differently: Take 12.5 mg by mouth 2 (two) times daily. Hold dose if SBP <110. 03/06/22   Zannie Cove, MD  Cholecalciferol (VITAMIN D-3) 125 MCG (5000 UT) TABS Take 10,000 Units by mouth daily.    [provider]  clopidogrel (PLAVIX) 75 MG tablet Take 75 mg by mouth daily. 01/26/23   [provider]  Coenzyme Q10 (COQ10 PO) Take 400 mg by mouth daily.    [provider]  dapsone 100 MG tablet Take 1 tablet (100 mg total) by mouth daily. 01/07/23   Hunsucker, Lesia Sago, MD  dofetilide (TIKOSYN) 500 MCG capsule TAKE 1 CAPSULE BY MOUTH 2 TIMES DAILY. 11/02/22   Eustace Pen, PA-C  Evolocumab (REPATHA SURECLICK) 140 MG/ML SOAJ INJECT 140 MG INTO THE SKIN EVERY 14 (FOURTEEN) DAYS. INJECT 1 PEN INTO THE FATTY TISSUE SKIN EVERY 14 DAY 05/06/22   Chilton Si, MD  ferrous sulfate 325 (65 FE) MG EC tablet Take 325 mg by mouth daily with breakfast.    [provider]  furosemide (LASIX) 40 MG tablet Take 1 tablet (40 mg total) by mouth daily. 01/07/23   Zannie Cove, MD  levothyroxine (SYNTHROID, LEVOTHROID) 137 MCG tablet Take 137 mcg by mouth daily before breakfast. 10/27/14   [provider]  loratadine (CLARITIN) 10 MG tablet Take 10 mg by mouth daily.    [provider]  metFORMIN (GLUCOPHAGE) 500 MG tablet Take 500 mg by mouth 2 (two) times daily. 10/27/17   [provider]  Multiple Vitamins-Minerals (CENTRUM SILVER 50+MEN) TABS Take 1 tablet by mouth daily.    [provider]  nitroGLYCERIN (NITROSTAT) 0.4 MG SL tablet Place 1 tablet (0.4 mg total) under the tongue every 5 (five) minutes x 3 doses as needed for chest pain. 09/29/21   Jonita Albee, PA-C  pantoprazole (PROTONIX) 40  MG tablet TAKE 1 TABLET BY MOUTH 2 (TWO) TIMES DAILY FOR 7 DAYS, THEN 1 TABLET (40 MG TOTAL) DAILY. 01/13/23   Alver Sorrow, NP  Pirfenidone 267 MG TABS Take 1 tab three times daily for 7 days, then 2 tabs three times daily for 7 days, then 3 tabs three times daily thereafter. 01/18/23   Kalman Shan, MD  potassium chloride SA (KLOR-CON M) 20 MEQ tablet Take 1 tablet (20 mEq total) by mouth daily. 01/07/23   Zannie Cove, MD  predniSONE (DELTASONE) 20 MG tablet Take 60 mg (3 tabs) daily for 2 weeks, cut down to 40 mg (2 tabs) daily from 1/17 01/07/23   Zannie Cove, MD  riTUXimab (RITUXAN IV) Inject 1 application  into the vein See admin instructions. Take every two weeks and every six months alternating    [provider]  Semaglutide (OZEMPIC, 0.25 OR 0.5 MG/DOSE, [place]) Inject 0.5 mg into the skin once a week.    [provider]  testosterone cypionate (DEPOTESTOSTERONE CYPIONATE) 200 MG/ML injection Inject 150 mg into the muscle every 14 (fourteen) days.    [provider]  venlafaxine XR (EFFEXOR-XR) 150 MG 24 hr capsule Take 150 mg by mouth daily.    [provider]  warfarin (COUMADIN) 5 MG tablet TAKE  1/2 TABLET TO 1 TABLET BY MOUTH DAILY AS DIRECTED BY COUMADIN CLINIC Patient taking differently: Take 2.5-5 mg by mouth See admin instructions. TAKE 1/2 TABLET TO 1 TABLET BY MOUTH DAILY AS DIRECTED BY COUMADIN CLINIC; 2.5 mg Mon Wed Fri; 5 mg all other days 11/11/22   Chilton Si, MD     Allergies:     Allergies  Allergen Reactions   Farxiga [Dapagliflozin] Other (See Comments)    Lightheadedness Low blood pressure   Statins Other (See Comments)    Myalgias      Physical Exam:   Vitals  Blood pressure (!) 106/52, pulse 74, temperature 98 F (36.7 C), temperature source Oral, resp. rate 19, height 5\' 9"  (1.753 m), weight 94.8 kg, SpO2 95%.   1. General Elderly Caucasian male lying in hospital bed in no apparent distress  2. Normal  affect and insight, Not Suicidal or Homicidal, Awake Alert,   3. No F.N deficits, ALL C.Nerves Intact, Strength 5/5 all 4 extremities, Sensation intact all 4 extremities, Plantars down going.  4. Ears and Eyes appear Normal, Conjunctivae clear, PERRLA. Moist Oral Mucosa.  5. Supple Neck, No JVD, No cervical lymphadenopathy appriciated, No Carotid Bruits.  6. Symmetrical Chest wall movement, Good air movement bilaterally, CTAB.  7. RRR, No Gallops, Rubs or Murmurs, No Parasternal Heave.  8. Positive Bowel Sounds, Abdomen Soft, No tenderness, No organomegaly appriciated,No rebound -guarding or rigidity.  9.  No Cyanosis, Normal Skin Turgor, No Skin Rash or Bruise.  10. Good muscle tone,  joints appear normal , no effusions, Normal ROM.  11. No Palpable Lymph Nodes in Neck or Axillae      Data Review:   Recent Labs  Lab 02/03/23 1304  WBC 13.8*  HGB 8.7*  HCT 30.1*  PLT 341  MCV 86.0  MCH 24.9*  MCHC 28.9*  RDW 22.6*  LYMPHSABS 1.0  MONOABS 1.1*  EOSABS 0.3  BASOSABS 0.1    Recent Labs  Lab 02/01/23 0000 02/03/23 1304  NA  --  140  K  --  3.9  CL  --  102  CO2  --  25  ANIONGAP  --  13  GLUCOSE  --  116*  BUN  --  27*  CREATININE  --  1.04  AST  --  23  ALT  --  25  ALKPHOS  --  72  BILITOT  --  0.6  ALBUMIN  --  3.0*  INR 2.50* 2.7*  CALCIUM  --  9.2    Lab Results  Component Value Date   CHOL 155 05/26/2020   HDL 45 05/26/2020   LDLCALC 79 05/26/2020   TRIG 184 (H) 05/26/2020   CHOLHDL 3.4 05/26/2020    Recent Labs  Lab 02/01/23 0000 02/03/23 1304  INR 2.50* 2.7*  CALCIUM  --  9.2    Recent Labs  Lab 02/03/23 1304  WBC 13.8*  PLT 341  CREATININE 1.04    Urinalysis    Component Value Date/Time   COLORURINE YELLOW 09/17/2022 2236   APPEARANCEUR HAZY (A) 09/17/2022 2236   LABSPEC 1.010 09/17/2022 2236   PHURINE 6.5 09/17/2022 2236   GLUCOSEU NEGATIVE 09/17/2022 2236   HGBUR NEGATIVE 09/17/2022 2236   BILIRUBINUR NEGATIVE  09/17/2022 2236   KETONESUR NEGATIVE 09/17/2022 2236   PROTEINUR NEGATIVE 09/17/2022 2236   UROBILINOGEN 0.2 12/24/2011 0805   NITRITE NEGATIVE 09/17/2022 2236   LEUKOCYTESUR SMALL (A) 09/17/2022 2236      Imaging Results:    DG  Chest Portable 1 View Result Date: 02/03/2023 CLINICAL DATA:  Shortness of breath. Hypoxia. History of Wegener's and interstitial lung disease. EXAM: PORTABLE CHEST 1 VIEW COMPARISON:  Radiographs 01/05/2023 and 01/01/2023.  CT 01/07/2023. FINDINGS: 1252 hours. The heart size and mediastinal contours are stable status post median sternotomy and CABG. Chronic interstitial lung disease again noted with possible slight worsening on the right which could indicate superimposed edema or inflammation. There is no consolidation, pneumothorax or significant pleural effusion. The bones appear unchanged. IMPRESSION: Chronic interstitial lung disease with possible slight worsening on the right which could indicate superimposed edema or inflammation. Electronically Signed   By: Carey Bullocks M.D.   On: 02/03/2023 14:01    My personal review of EKG: Rhythm NSR,  no Acute ST changes   Assessment & Plan:    1.  Acute on chronic hypoxic respiratory failure.  In a patient with history of ILD, Wegener's granulomatosis who is on 2 to 3 L nasal cannula oxygen at all times with mild pulmonary hypertension per recent right heart cath.  Recently admitted for an ILD flare, currently on oral steroid taper, took 2 weeks of 60 mg prednisone currently on 40 mg of prednisone for 10 days, he does not have any fever chills, no productive cough, no orthopnea or evidence of weight gain.  In my opinion he likely has another flare of ILD symptoms worse due to gradually progressive iron deficiency anemia as well.  To be admitted to progressive care, currently on 6 L nasal cannula oxygen, initiate IV steroids, check BNP, CRP and procalcitonin, he has mild leukocytosis due to recent steroid exposure, PCCM  will be consulted, he will be encouraged to sit in chair use I-S and flutter valve for pulmonary toiletry.  Advance activity and titrate down oxygen.  Follow-up on COVID testing and extended respiratory viral panel.  Initial flu test negative.  His other immunosuppressants will be deferred to pulmonary.  2.  Gradually worsening iron deficiency anemia.  Recently required IV iron transfusion, currently on oral PPI and oral iron, hold Coumadin, heparin drip once INR reaches 2 or below, continue PPI, type screen, transfuse if it drops below 8 with symptoms.  [facility] has been consulted may benefit from EGD/colonoscopy.  He does not have an acute bleed.  Hemoccult stool will be sent for testing.  3.  Chronic atrial fibrillation.  [place] vas 2 score of greater than 3.  Tikosyn, continue heparin drip as above once INR is below 2, blood pressure is soft hence Coreg dose will be cut down.  Monitor on telemetry.  4.  HX of CAD s/p stent and CABG in the past.  On DAPT along with Coreg, statin and Coumadin.  Anticoagulation as above, continue DAPT for now, continue statin, no chest pain, EKG stable, recent echo with a preserved EF of 65%.  Recent right heart cath unremarkable except for mild pulmonary artery hypertension.  5.  Hypothyroidism.  Continue home dose Synthroid.  6.  Depression and anxiety.  On Effexor.  7.  DM type II.  Check A1c.  Sliding scale.   DVT Prophylaxis Heparin /  SCDs    AM Labs Ordered, also please review Full Orders  Family Communication: Admission, patients condition and plan of care including tests being ordered have been discussed with the patient , wife and daughter who indicate understanding and agree with the plan and Code Status.  Code Status  Full  Likely DC to  TBD  Condition GUARDED  Consults called: PCCM, [facility]    Admission status:   Inpt  Time spent in minutes : 40  Signature  -    Susa Raring M.D on 02/03/2023 at 3:40 PM   -  To page go to  [email or web address]

## 2023-02-03 NOTE — ED Notes (Signed)
Assumed pt care.

## 2023-02-03 NOTE — Patient Instructions (Signed)
Transport to ER via EMS

## 2023-02-03 NOTE — Assessment & Plan Note (Signed)
Acute on chronic hypoxic respiratory failure in the setting progressive ILD. Patient is a very complicated case with underlying Wegener's granulomatosis related ILD.  Patient has had progressive decline over the last several months.  Now with requiring increased oxygen questionable etiology.  He also has possible underlying GI bleed with bloody stools, symptomatic anemia on Coumadin -supratherapeutic recently. Also received iron infusion and Rituxan this week.  Will need further evaluation.  EMS was called report was given patient in stable condition on nonrebreather with O2 saturations at 95%. Patient to be transported to the emergency room for further evaluation and treatment-most likely will need hospital admission.

## 2023-02-04 DIAGNOSIS — J9621 Acute and chronic respiratory failure with hypoxia: Secondary | ICD-10-CM | POA: Diagnosis not present

## 2023-02-04 DIAGNOSIS — M3130 Wegener's granulomatosis without renal involvement: Secondary | ICD-10-CM

## 2023-02-04 DIAGNOSIS — J849 Interstitial pulmonary disease, unspecified: Secondary | ICD-10-CM | POA: Diagnosis not present

## 2023-02-04 LAB — RESPIRATORY PANEL BY PCR

## 2023-02-04 LAB — CBG MONITORING, ED
Glucose-Capillary: 141 mg/dL — ABNORMAL HIGH (ref 70–99)
Glucose-Capillary: 273 mg/dL — ABNORMAL HIGH (ref 70–99)
Glucose-Capillary: 118 mg/dL — ABNORMAL HIGH (ref 70–99)

## 2023-02-04 LAB — CBC WITH DIFFERENTIAL/PLATELET
Abs Immature Granulocytes: 0.25 10*3/uL — ABNORMAL HIGH (ref 0.00–0.07)
Basophils Absolute: 0 10*3/uL (ref 0.0–0.1)
Basophils Relative: 0 %
Eosinophils Absolute: 0 10*3/uL (ref 0.0–0.5)
Eosinophils Relative: 0 %
HCT: 32.5 % — ABNORMAL LOW (ref 39.0–52.0)
Hemoglobin: 9.1 g/dL — ABNORMAL LOW (ref 13.0–17.0)
Immature Granulocytes: 2 %
Lymphocytes Relative: 6 %
Lymphs Abs: 0.7 10*3/uL (ref 0.7–4.0)
MCH: 24.5 pg — ABNORMAL LOW (ref 26.0–34.0)
MCHC: 28 g/dL — ABNORMAL LOW (ref 30.0–36.0)
MCV: 87.4 fL (ref 80.0–100.0)
Monocytes Absolute: 0.4 10*3/uL (ref 0.1–1.0)
Monocytes Relative: 3 %
Neutro Abs: 10.4 10*3/uL — ABNORMAL HIGH (ref 1.7–7.7)
Neutrophils Relative %: 89 %
Platelets: 337 10*3/uL (ref 150–400)
RBC: 3.72 MIL/uL — ABNORMAL LOW (ref 4.22–5.81)
RDW: 23.1 % — ABNORMAL HIGH (ref 11.5–15.5)
WBC: 11.7 10*3/uL — ABNORMAL HIGH (ref 4.0–10.5)
nRBC: 0.4 % — ABNORMAL HIGH (ref 0.0–0.2)

## 2023-02-04 LAB — BASIC METABOLIC PANEL
Anion gap: 12 (ref 5–15)
BUN: 23 mg/dL (ref 8–23)
CO2: 22 mmol/L (ref 22–32)
Calcium: 9 mg/dL (ref 8.9–10.3)
Chloride: 104 mmol/L (ref 98–111)
Creatinine, Ser: 0.89 mg/dL (ref 0.61–1.24)
GFR, Estimated: >60 mL/min (ref >60)
Glucose, Bld: 160 mg/dL — ABNORMAL HIGH (ref 70–99)
Potassium: 4 mmol/L (ref 3.5–5.1)
Sodium: 138 mmol/L (ref 135–145)

## 2023-02-04 LAB — GLUCOSE, CAPILLARY: Glucose-Capillary: 207 mg/dL — ABNORMAL HIGH (ref 70–99)

## 2023-02-04 LAB — MAGNESIUM: Magnesium: 2.2 mg/dL (ref 1.7–2.4)

## 2023-02-04 LAB — HEPARIN LEVEL (UNFRACTIONATED): Heparin Unfractionated: 0.21 [IU]/mL — ABNORMAL LOW (ref 0.30–0.70)

## 2023-02-04 LAB — PHOSPHORUS: Phosphorus: 4.2 mg/dL (ref 2.5–4.6)

## 2023-02-04 LAB — TROPONIN I (HIGH SENSITIVITY): Troponin I (High Sensitivity): 26 ng/L — ABNORMAL HIGH (ref <18)

## 2023-02-04 LAB — MRSA NEXT GEN BY PCR, NASAL: MRSA by PCR Next Gen: NOT DETECTED

## 2023-02-04 LAB — PROTIME-INR
INR: 1.8 — ABNORMAL HIGH (ref 0.8–1.2)
Prothrombin Time: 20.9 s — ABNORMAL HIGH (ref 11.4–15.2)

## 2023-02-04 LAB — BRAIN NATRIURETIC PEPTIDE: B Natriuretic Peptide: 248.4 pg/mL — ABNORMAL HIGH (ref 0.0–100.0)

## 2023-02-04 LAB — PROCALCITONIN: Procalcitonin: 0.47 ng/mL

## 2023-02-04 LAB — C-REACTIVE PROTEIN: CRP: 17.5 mg/dL — ABNORMAL HIGH (ref <1.0)

## 2023-02-04 MED ORDER — HEPARIN (PORCINE) 25000 UT/250ML-% IV SOLN
1000.0000 [IU]/h | INTRAVENOUS | Status: AC
  Administered 2023-02-04: 1250 [IU]/h via INTRAVENOUS
  Administered 2023-02-05: 1450 [IU]/h via INTRAVENOUS
  Administered 2023-02-06: 1000 [IU]/h via INTRAVENOUS
  Filled 2023-02-04 (×4): qty 250

## 2023-02-04 MED ORDER — ENSURE ENLIVE PO LIQD
237.0000 mL | Freq: Two times a day (BID) | ORAL | Status: DC
  Administered 2023-02-04 – 2023-02-11 (×9): 237 mL via ORAL
  Filled 2023-02-04: qty 237

## 2023-02-04 NOTE — Evaluation (Signed)
Physical Therapy Evaluation Patient Details Name: Gary [name] MRN: [id number] DOB: 1945/12/26 Today's Date: 02/04/2023  History of Present Illness  Pt is 78 yo male admitted on 1/30/425 with acute on chronic resp failure with hypoxia.  Pt with hx including but not limited to Wegener's granulomatosis, ILD, former smoker, CAD/CABG, afib, CHF, DM2   Clinical Impression  Pt admitted with above diagnosis. PTA pt lived at home with wife, mod I mobility with RW and supplemental O2 (2-4L). Pt currently with functional limitations due to the deficits listed below (see PT Problem List). On eval, pt demo mod I bed mobility. Supervision sit to stand. Unable to progress gait due to desat and DOE. SpO2 97% at rest on 8L. Desat to 86% with standing and 3/4 DOE. Pt will benefit from acute skilled PT to increase their independence and safety with mobility to allow discharge. Recommend resuming HHPT on d/c.           If plan is discharge home, recommend the following: Assistance with cooking/housework;Assist for transportation;Help with stairs or ramp for entrance   Can travel by private vehicle        Equipment Recommendations None recommended by PT  Recommendations for Other Services       Functional Status Assessment Patient has had a recent decline in their functional status and demonstrates the ability to make significant improvements in function in a reasonable and predictable amount of time.     Precautions / Restrictions Precautions Precautions: Other (comment) Precaution Comments: watch sats      Mobility  Bed Mobility Overal bed mobility: Modified Independent                  Transfers Overall transfer level: Needs assistance Equipment used: None Transfers: Sit to/from Stand Sit to Stand: Supervision           General transfer comment: no physical assist    Ambulation/Gait               General Gait Details: unable due to desat on 8L  Stairs             Wheelchair Mobility     Tilt Bed    Modified Rankin (Stroke Patients Only)       Balance Overall balance assessment: Mild deficits observed, not formally tested                                           Pertinent Vitals/Pain Pain Assessment Pain Assessment: No/denies pain    Home Living Family/patient expects to be discharged to:: Private residence Living Arrangements: Spouse/significant other Available Help at Discharge: Family;Available 24 hours/day Type of Home: House Home Access: Stairs to enter Entrance Stairs-Rails: Right Entrance Stairs-Number of Steps: 2   Home Layout: Two level;Able to live on main level with bedroom/bathroom Home Equipment: Grab bars - tub/shower;Shower Counsellor (2 wheels);Rollator (4 wheels);Other (comment) (home O2) Additional Comments: active with HHPT on admission    Prior Function Prior Level of Function : Independent/Modified Independent;Driving             Mobility Comments: amb household distances with RW. 2-4L supplemental O2 ADLs Comments: Reports independent with adls and iadls     Extremity/Trunk Assessment   Upper Extremity Assessment Upper Extremity Assessment: Overall WFL for tasks assessed    Lower Extremity Assessment Lower Extremity Assessment: Generalized weakness    Cervical /  Trunk Assessment Cervical / Trunk Assessment: Normal  Communication   Communication Communication: No apparent difficulties  Cognition Arousal: Alert Behavior During Therapy: WFL for tasks assessed/performed Overall Cognitive Status: Within Functional Limits for tasks assessed                                          General Comments General comments (skin integrity, edema, etc.): SpO2 97% on 8L at rest. Desat to 86% with standing bedside. 3/4 DOE. 3 minute recovery time with pursed lip breathing in sitting to return to 90s.    Exercises     Assessment/Plan    PT Assessment  Patient needs continued PT services  PT Problem List Decreased strength;Decreased mobility;Cardiopulmonary status limiting activity;Decreased activity tolerance       PT Treatment Interventions Gait training;Functional mobility training;Patient/family education;Therapeutic exercise;Stair training;Therapeutic activities    PT Goals (Current goals can be found in the Care Plan section)  Acute Rehab PT Goals Patient Stated Goal: home PT Goal Formulation: With patient Time For Goal Achievement: 02/18/23 Potential to Achieve Goals: Fair    Frequency Min 1X/week     Co-evaluation               AM-PAC PT "6 Clicks" Mobility  Outcome Measure Help needed turning from your back to your side while in a flat bed without using bedrails?: None Help needed moving from lying on your back to sitting on the side of a flat bed without using bedrails?: None Help needed moving to and from a bed to a chair (including a wheelchair)?: A Little Help needed standing up from a chair using your arms (e.g., wheelchair or bedside chair)?: A Little Help needed to walk in hospital room?: A Little Help needed climbing 3-5 steps with a railing? : A Little 6 Click Score: 20    End of Session Equipment Utilized During Treatment: Oxygen Activity Tolerance: Patient limited by fatigue;Treatment limited secondary to medical complications (Comment) (desat) Patient left: in bed;with call bell/phone within reach Nurse Communication: Mobility status PT Visit Diagnosis: Difficulty in walking, not elsewhere classified (R26.2)    Time: 3086-5784 PT Time Calculation (min) (ACUTE ONLY): 12 min   Charges:   PT Evaluation $PT Eval Low Complexity: 1 Low   PT General Charges $$ ACUTE PT VISIT: 1 Visit         Ferd Glassing., PT  Office # [phone]   Ilda Foil 02/04/2023, 11:32 AM

## 2023-02-04 NOTE — ED Notes (Signed)
Called pt's wife back and gave update.

## 2023-02-04 NOTE — Progress Notes (Signed)
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  Patient was readmitted with ILD flare.  Apparently, patient was discharged recently, and was on tapering dose of prednisone.  Input from the pulmonary team is highly appreciated.  Patient is usually maintained on rituximab and steroids.  Patient is currently on IV Solu-Medrol 125 Mg daily.  Shortness of breath is slowly improving.  02/04/2023: Patient was seen alongside patient's son.  Patient becomes dyspneic with minimal exertion.  Will continue IV Solu-Medrol for now.   Assessment & Plan:   Principal Problem:   Acute and chronic respiratory failure with hypoxia (HCC)  Acute on chronic respiratory failure: -History of ILD and was manage chronic dermatosis. -Patient has been maintained on rituximab and prednisone. -Recent admission for ILD flare. -Patient has been readmitted with worsening shortness of breath. -Patient was on tapering dose of prednisone prior to readmission. -Continue IV Solu-Medrol. -Pulmonary/critical care teams input is highly appreciated.  Iron deficiency anemia: -GI input is appreciated. -Further workup as per GI team.  Chronic atrial fibrillation: -Patient is on Tikosyn. -Continue heparin.  History of coronary artery disease status post PCI and CABG: -Stable.  Hypothyroidism: -Continue Synthroid.  Depression and anxiety: -Stable. -Patient is on Effexor.  Type 2 diabetes mellitus: -Last A1c of 5.9%. -Monitor blood sugar closely as patient is  currently on IV Solu-Medrol.   DVT prophylaxis: Heparin. Code Status: Full code. Family Communication: None. Disposition Plan: Patient remains inpatient.  Severe and multiple comorbidities.   Consultants:  Pulmonary/critical care team GI  Procedures:  None.  Antimicrobials:  None.   Subjective: -Dyspnea on minimal exertion.  Objective: Vitals:   02/04/23 0916 02/04/23 1245 02/04/23 1314 02/04/23 1721  BP: 132/60 123/70 123/63 134/80  Pulse: 72 72 77 74  Resp: 19 (!) 26 (!) 29 (!) 26  Temp: (!) 97.4 F (36.3 C)  97.8 F (36.6 C) 97.7 F (36.5 C)  TempSrc: Oral  Oral   SpO2: 97% 99% 100% 96%  Weight:      Height:        Intake/Output Summary (Last 24 hours) at 02/04/2023 1802 Last data filed at 02/04/2023 0916 Gross per 24 hour  Intake --  Output 950 ml  Net -950 ml   Filed Weights   02/03/23 1227  Weight: 94.8 kg    Examination:  General exam: Appears calm and comfortable.  Patient gets dyspneic on speaking. Respiratory system: Clear to auscultation.   Cardiovascular system: S1 & S2  Gastrointestinal system: Abdomen is obese, soft and nontender.   Central nervous system: Awake and alert.  Patient moves all extremities.     Data Reviewed: I have personally reviewed following labs and imaging studies  CBC: Recent Labs  Lab 02/03/23 1304 02/04/23 0454  WBC 13.8* 11.7*  NEUTROABS 11.1* 10.4*  HGB 8.7* 9.1*  HCT 30.1* 32.5*  MCV 86.0 87.4  PLT 341 337   Basic Metabolic Panel: Recent Labs  Lab 02/03/23 1304 02/03/23 1443 02/04/23 0454  NA 140  --  138  K 3.9  --  4.0  CL 102  --  104  CO2 25  --  22  GLUCOSE 116*  --  160*  BUN 27*  --  23  CREATININE 1.04  --  0.89  CALCIUM 9.2  --  9.0  MG  --  2.3 2.2  PHOS  --   --  4.2   GFR: Estimated Creatinine Clearance: 76.4 mL/min (by C-G formula based on SCr of 0.89 mg/dL). Liver Function Tests: Recent Labs  Lab 02/03/23 1304  AST 23  ALT 25  ALKPHOS 72  BILITOT 0.6  PROT 5.3*   ALBUMIN 3.0*   No results for input(s): "LIPASE", "AMYLASE" in the last 168 hours. No results for input(s): "AMMONIA" in the last 168 hours. Coagulation Profile: Recent Labs  Lab 02/01/23 0000 02/03/23 1304 02/04/23 0454  INR 2.50* 2.7* 1.8*   Cardiac Enzymes: No results for input(s): "CKTOTAL", "CKMB", "CKMBINDEX", "TROPONINI" in the last 168 hours. BNP (last 3 results) Recent Labs    11/04/22 1519  PROBNP 416.0*   HbA1C: No results for input(s): "HGBA1C" in the last 72 hours. CBG: Recent Labs  Lab 02/03/23 1956 02/03/23 2131 02/04/23 0753 02/04/23 1211 02/04/23 1749  GLUCAP 153* 169* 141* 273* 118*   Lipid Profile: No results for input(s): "CHOL", "HDL", "LDLCALC", "TRIG", "CHOLHDL", "LDLDIRECT" in the last 72 hours. Thyroid Function Tests: No results for input(s): "TSH", "T4TOTAL", "FREET4", "T3FREE", "THYROIDAB" in the last 72 hours. Anemia Panel: Recent Labs    02/03/23 1443 02/03/23 1911  VITAMINB12  --  536  FOLATE 15.7  --   FERRITIN  --  470*  TIBC  --  412  IRON  --  162  RETICCTPCT  --  5.6*   Urine analysis:    Component Value Date/Time   COLORURINE YELLOW 09/17/2022 2236   APPEARANCEUR HAZY (A) 09/17/2022 2236   LABSPEC 1.010 09/17/2022 2236   PHURINE 6.5 09/17/2022 2236   GLUCOSEU NEGATIVE 09/17/2022 2236   HGBUR NEGATIVE 09/17/2022 2236   BILIRUBINUR NEGATIVE 09/17/2022 2236   KETONESUR NEGATIVE 09/17/2022 2236   PROTEINUR NEGATIVE 09/17/2022 2236   UROBILINOGEN 0.2 12/24/2011 0805   NITRITE NEGATIVE 09/17/2022 2236   LEUKOCYTESUR SMALL (A) 09/17/2022 2236   Sepsis Labs: @LABRCNTIP (procalcitonin:4,lacticidven:4)  ) Recent Results (from the past 240 hours)  Resp panel by RT-PCR (RSV, Flu A&B, Covid) Anterior Nasal Swab     Status: None   Collection Time: 02/03/23 12:16 PM   Specimen: Anterior Nasal Swab  Result Value Ref Range Status   SARS Coronavirus 2 by RT PCR NEGATIVE NEGATIVE Final   Influenza A by PCR NEGATIVE NEGATIVE  Final   Influenza B by PCR NEGATIVE NEGATIVE Final    Comment: (NOTE) The Xpert Xpress SARS-CoV-2/FLU/RSV plus assay is intended as an aid in the diagnosis of influenza from Nasopharyngeal swab specimens and should not be used as a sole basis for treatment. Nasal washings and aspirates are unacceptable for Xpert Xpress SARS-CoV-2/FLU/RSV testing.  Fact Sheet for Patients: BloggerCourse.com  Fact Sheet for Healthcare Providers: SeriousBroker.it  This test is not yet approved or cleared by the [place] FDA and has been authorized for detection and/or diagnosis of SARS-CoV-2 by FDA under an Emergency Use Authorization (EUA). This EUA will remain in effect (meaning this test can be used) for the duration of the COVID-19 declaration under Section 564(b)(1) of the Act, 21 U.S.C. section 360bbb-3(b)(1), unless the authorization is terminated or revoked.     Resp Syncytial Virus by PCR NEGATIVE NEGATIVE Final    Comment: (NOTE) Fact Sheet for Patients: BloggerCourse.com  Fact Sheet for Healthcare Providers: SeriousBroker.it  This test is not yet approved or cleared by the [place] FDA and has been authorized for detection and/or diagnosis of SARS-CoV-2 by FDA under an Emergency Use Authorization (EUA). This EUA will remain in effect (meaning this test can be used) for the duration of the COVID-19 declaration under Section 564(b)(1) of the Act, 21 U.S.C. section 360bbb-3(b)(1), unless the authorization is terminated or revoked.  Performed at [facility], [address] [place], [place] [address]   MRSA Next Gen by PCR, Nasal     Status: None   Collection Time: 02/03/23  2:44 PM   Specimen: Nasal Mucosa; Nasal Swab  Result Value Ref Range Status   MRSA by PCR Next Gen NOT DETECTED NOT DETECTED Final    Comment: (NOTE) The GeneXpert MRSA Assay (FDA approved for  NASAL specimens only), is one component of a comprehensive MRSA colonization surveillance program. It is not intended to diagnose MRSA infection nor to guide or monitor treatment for MRSA infections. Test performance is not FDA approved in patients less than 107 years old. Performed at [facility], [address] [place], [place] [address]   Respiratory (~20 pathogens) panel by PCR     Status: None   Collection Time: 02/03/23  3:22 PM   Specimen: Nasopharyngeal Swab; Respiratory  Result Value Ref Range Status   Adenovirus NOT DETECTED NOT DETECTED Final   Coronavirus 229E NOT DETECTED NOT DETECTED Final    Comment: (NOTE) The Coronavirus on the Respiratory Panel, DOES NOT test for the novel  Coronavirus (2019 nCoV)    Coronavirus HKU1 NOT DETECTED NOT DETECTED Final   Coronavirus NL63 NOT DETECTED NOT DETECTED Final   Coronavirus OC43 NOT DETECTED NOT DETECTED Final   Metapneumovirus NOT DETECTED NOT DETECTED Final   Rhinovirus / Enterovirus NOT DETECTED NOT DETECTED Final   Influenza A NOT DETECTED NOT DETECTED Final   Influenza B NOT DETECTED NOT DETECTED Final   Parainfluenza Virus 1 NOT DETECTED NOT DETECTED Final   Parainfluenza Virus 2 NOT DETECTED NOT DETECTED Final   Parainfluenza Virus 3 NOT DETECTED NOT DETECTED Final   Parainfluenza Virus 4 NOT DETECTED NOT DETECTED Final   Respiratory Syncytial Virus NOT DETECTED NOT DETECTED Final   Bordetella pertussis NOT DETECTED NOT DETECTED Final   Bordetella Parapertussis NOT DETECTED NOT DETECTED Final   Chlamydophila pneumoniae NOT DETECTED NOT DETECTED Final   Mycoplasma pneumoniae NOT DETECTED NOT DETECTED Final    Comment: Performed at [facility], [address], [place], [place] [address]         Radiology Studies: DG Chest Portable 1 View Result Date: 02/03/2023 CLINICAL DATA:  Shortness of breath. Hypoxia. History of Wegener's and interstitial lung disease. EXAM: PORTABLE CHEST 1 VIEW COMPARISON:   Radiographs 01/05/2023 and 01/01/2023.  CT 01/07/2023. FINDINGS: 1252 hours. The heart size and mediastinal contours are stable status post median sternotomy and CABG. Chronic interstitial lung disease again noted with possible slight worsening on the right which could indicate superimposed edema or inflammation. There is no consolidation, pneumothorax or significant pleural effusion. The bones appear unchanged. IMPRESSION: Chronic interstitial lung disease with possible slight worsening on the right which could indicate superimposed edema or inflammation. Electronically Signed   By: Carey Bullocks M.D.   On: 02/03/2023 14:01        Scheduled Meds:  ALPRAZolam  0.5 mg Oral QHS   aspirin  81 mg Oral Daily   carvedilol  3.125 mg Oral BID WC   clopidogrel  75 mg Oral Daily   dapsone  100 mg Oral Daily   dofetilide  500 mcg Oral BID   feeding supplement  237 mL Oral BID BM   furosemide  40 mg Intravenous Daily   insulin aspart  0-5 Units Subcutaneous QHS   insulin aspart  0-9 Units Subcutaneous TID WC   levothyroxine  137 mcg Oral QAC breakfast   methylPREDNISolone (SOLU-MEDROL) injection  125 mg Intravenous Q12H   pantoprazole  40 mg Oral BID   venlafaxine XR  150 mg Oral Daily   Continuous Infusions:  heparin 1,250 Units/hr (02/04/23 1124)     LOS: 1 day    Time spent: 55 minutes.    Berton Mount, MD  [facility] Pager #: [phone] 7PM-7AM contact night coverage as above

## 2023-02-04 NOTE — Progress Notes (Signed)
NAME:  SHALON COUNCILMAN, MRN:  [id number], DOB:  21-Jun-1945, LOS: 1 ADMISSION DATE:  02/03/2023, CONSULTATION DATE:  02/03/2023 REFERRING MD:  Dr. Susa Raring, CHIEF COMPLAINT:  dyspnea, hypoxia   History of Present Illness:  This 78 year old Caucasian male is seen in consultation at the request of Dr. Susa Raring for recommendations on further evaluation and management of hypoxia.  The patient was sent to [facility] emergency department from [facility] pulmonary clinic today, after he presented with reports of having profound hypoxia at home (down to SpO2 45%).  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  He is on rituximab and steroids.  In fact, he was recently hospitalized for interstitial lung disease flareup and has been on a prednisone taper, currently on prednisone 40 mg p.o. daily.  Treated with high-dose steroids.  Right heart cath on 01/04/2023 showed normal filling pressures, mild pulmonary hypertension and preserved cardiac output.  He reported doing well after his recent hospitalization and felt that he had a very good day yesterday.  However, this morning he woke up and experienced extreme exertional dyspnea while getting from his bed to the bathroom, when he noted that his SpO2 went down to 45%.  He is normally on supplemental oxygen at 2-2.5 LPM via nasal cannula.  He does titrate as needed to maintain SpO2 90%.  However, he noted that despite turning his supplemental oxygen up to 6 LPM (which he typically needs during ambulation), he was not recovering.  He denies cough.  He denies pleuritic pain.  He denies sputum production.  He denies chest pain.  He does state that when he was profoundly hypoxic he did notice that he was experiencing blurring of vision.  He has noted some modest increased swelling in his lower extremities.  He does confess that he is prescribed daily furosemide but is inconsistent with adherence to  prescribed therapy.  Patient says he has had a very stressful and very active week, which revolves around having to get to multiple medical appointments.  He had an iron infusion a few days ago.  He also had a Rituxan infusion earlier this week.  Patient says he has been having blood in his stools, although he says that this is not any more than usual.  This has been a chronic problem ever since his diagnosis of Wegener's granulomatosis.    He is on Coumadin for chronic atrial fibrillation.  His last INR check (01/24/2023) was supratherapeutic, 6.7.  However, upon recheck in the emergency department, this has fallen into the therapeutic range, 2.7.  Last hemoglobin reported by patient was 8.2, currently 8.7.   Pertinent  Medical History  PAST MEDICAL history significant for Wegener's granulomatosis (diagnosed 2019, on Rituxan and steroids), type 2 diabetes mellitus, OSA on CPAP, atrial fibrillation on coumadin, HFpEF (LVEF 65 in 12/28/2022, grade 1 diastolic dysfunction), pulmonary hypertension, coronary artery disease.  Significant Hospital Events: Including procedures, antibiotic start and stop dates in addition to other pertinent events   1/30 presented with shortness of breath and hypoxia to steroids started 1/31 states he feels well but is requiring 8 to 10 L high flow nasal cannula  Interim History / Subjective:  States he feels well this a.m. with no acute complaints, reports shortness of breath worsens with movement  Objective   Blood pressure 123/63, pulse 77, temperature 97.8 F (36.6 C), temperature source Oral, resp. rate (!) 29, height 5\' 9"  (1.753 m), weight 94.8 kg, SpO2  100%.    FiO2 (%):  [40 %] 40 % PEEP:  [7 cmH20] 7 cmH20   Intake/Output Summary (Last 24 hours) at 02/04/2023 1617 Last data filed at 02/04/2023 1610 Gross per 24 hour  Intake --  Output 950 ml  Net -950 ml   Filed Weights   02/03/23 1227  Weight: 94.8 kg    Examination: General: Acute on chronic  ill-appearing elderly male lying on ED stretcher in no acute distress HEENT: [place]/AT, MM pink/moist, PERRL,  Neuro: Alert and oriented X 3 CV: s1s2 regular rate and rhythm, no murmur, rubs, or gallops,  PULM: Diminished bilaterally, no increased work of breathing, no added breath sounds GI: soft, bowel sounds active in all 4 quadrants, non-tender, non-distended, tolerating oral diet Extremities: warm/dry, no edema  Skin: no rashes or lesions   Resolved Hospital Problem list   Not applicable.  Assessment & Plan:  Acute on chronic hypoxemic respiratory failure -Patient denies any respiratory complaints including cough or congestion.  Denies any known sick contacts.  Reports hypoxia developed rather suddenly and does note that he had weaned down to 20 mg daily of prednisone day prior OSA compliant with CPAP at baseline  Interstitial lung disease secondary to Wegener's granulomatosis P: Continue CPAP at at bedtime Wean supplemental oxygen for sat goal greater than 93% Continue high-dose steroids, discussed with primary pulmonologist [name] regarding steroid taper upon discharge Encourage pulmonary hygiene Diurese as able   Best Practice (right click and "Reselect all SmartList Selections" daily)   Diet/type: Regular consistency (see orders) DVT prophylaxis Coumadin Pressure ulcer(s): N/A GI prophylaxis: PPI Lines: N/A Foley:  N/A Code Status:  full code Last date of multidisciplinary goals of care discussion [PENDING]  Critical care time: NA  Makai Dumond D. [name], NP-C [facility] Personal contact information can be found on Amion  If no contact or response made please call [phone] 02/04/2023, 4:38 PM

## 2023-02-04 NOTE — Progress Notes (Signed)
PHARMACY - ANTICOAGULATION CONSULT NOTE  Pharmacy Consult for Heparin Indication: atrial fibrillation  Allergies  Allergen Reactions   Farxiga [Dapagliflozin] Other (See Comments)    Lightheadedness Low blood pressure   Statins Other (See Comments)    Myalgias     Patient Measurements: Height: 5\' 9"  (175.3 cm) Weight: 94.8 kg (209 lb) IBW/kg (Calculated) : 70.7 Heparin Dosing Weight: 90.3kg  Vital Signs: Temp: 97.4 F (36.3 C) (01/31 0916) Temp Source: Oral (01/31 0916) BP: 132/60 (01/31 0916) Pulse Rate: 72 (01/31 0916)  Labs: Recent Labs    02/03/23 1304 02/03/23 1443 02/03/23 1911 02/03/23 2341 02/04/23 0454  HGB 8.7*  --   --   --  9.1*  HCT 30.1*  --   --   --  32.5*  PLT 341  --   --   --  337  LABPROT 29.2*  --   --   --  20.9*  INR 2.7*  --   --   --  1.8*  CREATININE 1.04  --   --   --  0.89  TROPONINIHS 31* 31* 37* 26*  --     Estimated Creatinine Clearance: 76.4 mL/min (by C-G formula based on SCr of 0.89 mg/dL).   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    Medications:  Scheduled:   ALPRAZolam  0.5 mg Oral QHS   aspirin  81 mg Oral Daily   carvedilol  3.125 mg Oral BID WC   clopidogrel  75 mg Oral Daily   dapsone  100 mg Oral Daily   dofetilide  500 mcg Oral BID   feeding supplement  237 mL Oral BID BM   furosemide  40 mg Intravenous Daily   insulin aspart  0-5 Units Subcutaneous QHS   insulin aspart  0-9 Units Subcutaneous TID WC    levothyroxine  137 mcg Oral QAC breakfast   methylPREDNISolone (SOLU-MEDROL) injection  125 mg Intravenous Q12H   pantoprazole  40 mg Oral BID   venlafaxine XR  150 mg Oral Daily    Assessment:  [id number] with history of atrial fibrillation on warfarin who presents with SOB. Last warfarin dose PTA was 1/29. Patient reports dark stools PTA. Due to concern for GIB, pharmacy has been consulted to start heparin drip once INR < 2.  INR 1.8, Hgb stable, up slightly 8.7>9.1. GI and primary team ok with starting heparin infusion now.   Goal of Therapy:  Heparin level 0.3-0.7 units/ml Monitor platelets by anticoagulation protocol: Yes   Plan:  Start heparin gtt at 1250 units/hr Heparin level in 8 hours and daily with CBC F/u INR daily Monitor daily CBC and s/sx of bleeding  Ruben Im, PharmD Clinical Pharmacist 02/04/2023 9:57 AM Please check AMION for all [facility] Pharmacy numbers

## 2023-02-04 NOTE — Consult Note (Addendum)
[facility] Gastroenterology Consult  Referring Provider: No ref. provider found Primary Care Physician:  Charlane Ferretti, DO Primary Gastroenterologist: [facility] - Dr. Evette Cristal  Reason for Consultation: melena  SUBJECTIVE:   HPI: Gary [name] is a 78 y.o. 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 Was admitted to hospital 01/01/23-01/08/23 with hypoxia, underwent right heart catheterization on 01/04/23 with findings of mild pulmonary hypertension, normal filling pressures, normal cardiac output. He was started on steroid therapy and discharged on 6 liters supplemental oxygen.   He noted having episode of hypoxia prompting his presentation to hospital, he is currently on 8 liters high flow nasal cannula during my evaluation of him. He noted that he has been on supplemental iron therapy (pills as well as liquid) to improve his iron level, Hgb. He has been seeing dark colored stools which he attributes to the iron therapy. He noted having a recent FOBT that was positive. He denied diarrhea, constipation. Denied abdominal pain. No chest pain.   Last colonoscopy 09/04/19 for heme positive stool (Dr. 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   Denied prior EGD.   Hgb on presentation 8.7 (to 9.1 this AM, baseline 10.6 on 01/01/23), WBC 13.8, PLT 341, Na 140, K 3.9, BUN/Cr 27/1.094, AST/ALT 23/25, ALP 72, total bilirubin 0.6, INR 2.7.  Chest xray showed chronic interstitial lung disease worse on right.   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   LVEF 45-50% 11/2014   Coronary artery disease    Depression    Essential hypertension 11/13/2014   Hyperlipidemia 11/13/2014   ILD (interstitial lung disease) (HCC)    Sleep apnea    CPAP 'Uses half the time"  last study 2009   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   Past Surgical History:  Procedure Laterality Date   CARDIOVERSION N/A 12/06/2014   Procedure: CARDIOVERSION;  Surgeon: Chilton Si, MD;  Location: [facility] ENDOSCOPY;  Service: Cardiovascular;  Laterality: N/A;   CARDIOVERSION N/A 05/26/2017   Procedure: CARDIOVERSION;  Surgeon: Chilton Si, MD;  Location: [facility] ENDOSCOPY;  Service: Cardiovascular;  Laterality: N/A;   CARDIOVERSION N/A 04/25/2019   Procedure: CARDIOVERSION;  Surgeon: Thurmon Fair, MD;  Location: [facility] ENDOSCOPY;  Service: Cardiovascular;  Laterality: N/A;   CARDIOVERSION N/A 02/26/2022   Procedure: CARDIOVERSION;  Surgeon: Chilton Si, MD;  Location: [facility] ENDOSCOPY;  Service: Cardiovascular;  Laterality: N/A;   CARDIOVERSION N/A 05/13/2022   Procedure: CARDIOVERSION;  Surgeon: Quintella Reichert, MD;  Location: [facility] INVASIVE CV LAB;  Service: Cardiovascular;  Laterality: N/A;   CARPAL TUNNEL RELEASE     CORONARY ANGIOPLASTY     multiple stents   CORONARY ARTERY BYPASS GRAFT  2006   CORONARY STENT INTERVENTION N/A 09/28/2021   Procedure: CORONARY STENT INTERVENTION;  Surgeon: Runell Gess, MD;  Location: [facility] INVASIVE CV LAB;  Service: Cardiovascular;  Laterality: N/A;  SVG-PDA   EYE SURGERY  right eye-  muscular repair   LEFT HEART CATH AND CORS/GRAFTS ANGIOGRAPHY N/A 09/28/2021   Procedure: LEFT HEART CATH AND CORS/GRAFTS ANGIOGRAPHY;  Surgeon: Runell Gess, MD;  Location: [facility] INVASIVE CV LAB;  Service:  Cardiovascular;  Laterality: N/A;   RIGHT HEART CATH N/A 01/04/2023   Procedure: RIGHT HEART CATH;  Surgeon: Laurey Morale, MD;  Location: [facility] INVASIVE CV LAB;  Service: Cardiovascular;  Laterality: N/A;   TOTAL HIP ARTHROPLASTY  01/03/2012   Procedure: TOTAL HIP ARTHROPLASTY;  Surgeon: Jacki Cones, MD;  Location: [facility] ORS;  Service: Orthopedics;  Laterality: Right;   Prior to Admission medications   Medication Sig Start Date End Date Taking? Authorizing Provider  ALPRAZolam Prudy Feeler) 0.5 MG tablet Take 0.5 mg by mouth at bedtime. Takes at 11:00pm   Yes [provider]  aspirin 81 MG chewable tablet Chew 81 mg by mouth daily.   Yes [provider]  Calcium Carb-Cholecalciferol (CALCIUM 600/VITAMIN D3 PO) Take 1 tablet by mouth daily.   Yes [provider]  carvedilol (COREG) 25 MG tablet Take 0.5 tablets (12.5 mg total) by mouth 2 (two) times daily. Patient taking differently: Take 12.5 mg by mouth 2 (two) times daily. Hold dose if SBP <110. 03/06/22  Yes Zannie Cove, MD  Cholecalciferol (VITAMIN D-3) 125 MCG (5000 UT) TABS Take 10,000 Units by mouth daily.   Yes [provider]  clopidogrel (PLAVIX) 75 MG tablet Take 75 mg by mouth daily. 01/26/23  Yes [provider]  Coenzyme Q10 (COQ10 PO) Take 400 mg by mouth daily.   Yes [provider]  dapsone 100 MG tablet Take 1 tablet (100 mg total) by mouth daily. 01/07/23  Yes Hunsucker, Lesia Sago, MD  dofetilide (TIKOSYN) 500 MCG capsule TAKE 1 CAPSULE BY MOUTH 2 TIMES DAILY. 11/02/22  Yes Eustace Pen, PA-C  Evolocumab (REPATHA SURECLICK) 140 MG/ML SOAJ INJECT 140 MG INTO THE SKIN EVERY 14 (FOURTEEN) DAYS. INJECT 1 PEN INTO THE FATTY TISSUE SKIN EVERY 14 DAY 05/06/22  Yes Chilton Si, MD  ferrous sulfate 325 (65 FE) MG EC tablet Take 325 mg by mouth daily with breakfast.   Yes [provider]  furosemide (LASIX) 40 MG tablet Take 1 tablet (40 mg total) by mouth daily. 01/07/23   Yes Zannie Cove, MD  levothyroxine (SYNTHROID, LEVOTHROID) 137 MCG tablet Take 137 mcg by mouth daily before breakfast. 10/27/14  Yes [provider]  loratadine (CLARITIN) 10 MG tablet Take 10 mg by mouth daily.   Yes [provider]  metFORMIN (GLUCOPHAGE) 500 MG tablet Take 500 mg by mouth 2 (two) times daily. 10/27/17  Yes [provider]  Multiple Vitamins-Minerals (CENTRUM SILVER 50+MEN) TABS Take 1 tablet by mouth daily.   Yes [provider]  nitroGLYCERIN (NITROSTAT) 0.4 MG SL tablet Place 1 tablet (0.4 mg total) under the tongue every 5 (five) minutes x 3 doses as needed for chest pain. 09/29/21  Yes Jonita Albee, PA-C  pantoprazole (PROTONIX) 40 MG tablet TAKE 1 TABLET BY MOUTH 2 (TWO) TIMES DAILY FOR 7 DAYS, THEN 1 TABLET (40 MG TOTAL) DAILY. 01/13/23  Yes Alver Sorrow, NP  Pirfenidone 267 MG TABS Take 1 tab three times daily for 7 days, then 2 tabs three times daily for 7 days, then 3 tabs three times daily thereafter. 01/18/23  Yes Kalman Shan, MD  potassium chloride SA (KLOR-CON M) 20 MEQ tablet Take 1 tablet (20 mEq total) by mouth daily. 01/07/23  Yes  Zannie Cove, MD  predniSONE (DELTASONE) 20 MG tablet Take 60 mg (3 tabs) daily for 2 weeks, cut down to 40 mg (2 tabs) daily from 1/17 01/07/23  Yes Zannie Cove, MD  riTUXimab (RITUXAN IV) Inject 1 application  into the vein See admin instructions. Take every two weeks and every six months alternating   Yes [provider]  Semaglutide (OZEMPIC, 0.25 OR 0.5 MG/DOSE, [place]) Inject 0.5 mg into the skin once a week.   Yes [provider]  testosterone cypionate (DEPOTESTOSTERONE CYPIONATE) 200 MG/ML injection Inject 150 mg into the muscle every 14 (fourteen) days.   Yes [provider]  venlafaxine XR (EFFEXOR-XR) 150 MG 24 hr capsule Take 150 mg by mouth daily.   Yes [provider]  warfarin (COUMADIN) 5 MG tablet TAKE 1/2 TABLET TO 1 TABLET BY MOUTH  DAILY AS DIRECTED BY COUMADIN CLINIC Patient taking differently: Take 2.5-5 mg by mouth See admin instructions. TAKE 1/2 TABLET TO 1 TABLET BY MOUTH DAILY AS DIRECTED BY COUMADIN CLINIC; 2.5 mg Mon Wed Fri; 5 mg all other days 11/11/22  Yes Chilton Si, MD   Current Facility-Administered Medications  Medication Dose Route Frequency Provider Last Rate Last Admin   acetaminophen (TYLENOL) tablet 650 mg  650 mg Oral Q6H PRN Leroy Sea, MD       Or   acetaminophen (TYLENOL) suppository 650 mg  650 mg Rectal Q6H PRN Leroy Sea, MD       ALPRAZolam Prudy Feeler) tablet 0.5 mg  0.5 mg Oral QHS Susa Raring K, MD   0.5 mg at 02/03/23 2245   aspirin chewable tablet 81 mg  81 mg Oral Daily Leroy Sea, MD       bisacodyl (DULCOLAX) EC tablet 5 mg  5 mg Oral Daily PRN Leroy Sea, MD       carvedilol (COREG) tablet 3.125 mg  3.125 mg Oral BID WC Leroy Sea, MD   3.125 mg at 02/04/23 6045   clopidogrel (PLAVIX) tablet 75 mg  75 mg Oral Daily Leroy Sea, MD       dapsone tablet 100 mg  100 mg Oral Daily Leroy Sea, MD       dofetilide (TIKOSYN) capsule 500 mcg  500 mcg Oral BID Leroy Sea, MD   500 mcg at 02/03/23 2149   feeding supplement (ENSURE ENLIVE / ENSURE PLUS) liquid 237 mL  237 mL Oral BID BM Lorin Glass, MD       furosemide (LASIX) injection 40 mg  40 mg Intravenous Daily Marcelle Smiling, MD   40 mg at 02/04/23 0910   insulin aspart (novoLOG) injection 0-5 Units  0-5 Units Subcutaneous QHS Leroy Sea, MD       insulin aspart (novoLOG) injection 0-9 Units  0-9 Units Subcutaneous TID WC Leroy Sea, MD       levothyroxine (SYNTHROID) tablet 137 mcg  137 mcg Oral QAC breakfast Leroy Sea, MD   137 mcg at 02/04/23 4098   methylPREDNISolone sodium succinate (SOLU-MEDROL) 125 mg/2 mL injection 125 mg  125 mg Intravenous Q12H Marcelle Smiling, MD   125 mg at 02/04/23 0455   metoprolol tartrate (LOPRESSOR) injection 5 mg  5  mg Intravenous Q8H PRN Leroy Sea, MD       nitroGLYCERIN (NITROSTAT) SL tablet 0.4 mg  0.4 mg Sublingual Q5 Min x 3 PRN Leroy Sea, MD       pantoprazole (PROTONIX) EC tablet  40 mg  40 mg Oral BID Marcelle Smiling, MD   40 mg at 02/03/23 2115   promethazine (PHENERGAN) tablet 12.5 mg  12.5 mg Oral Q6H PRN Leroy Sea, MD       venlafaxine XR (EFFEXOR-XR) 24 hr capsule 150 mg  150 mg Oral Daily Leroy Sea, MD       Current Outpatient Medications  Medication Sig Dispense Refill   ALPRAZolam (XANAX) 0.5 MG tablet Take 0.5 mg by mouth at bedtime. Takes at 11:00pm     aspirin 81 MG chewable tablet Chew 81 mg by mouth daily.     Calcium Carb-Cholecalciferol (CALCIUM 600/VITAMIN D3 PO) Take 1 tablet by mouth daily.     carvedilol (COREG) 25 MG tablet Take 0.5 tablets (12.5 mg total) by mouth 2 (two) times daily. (Patient taking differently: Take 12.5 mg by mouth 2 (two) times daily. Hold dose if SBP <110.)     Cholecalciferol (VITAMIN D-3) 125 MCG (5000 UT) TABS Take 10,000 Units by mouth daily.     clopidogrel (PLAVIX) 75 MG tablet Take 75 mg by mouth daily.     Coenzyme Q10 (COQ10 PO) Take 400 mg by mouth daily.     dapsone 100 MG tablet Take 1 tablet (100 mg total) by mouth daily. 30 tablet 1   dofetilide (TIKOSYN) 500 MCG capsule TAKE 1 CAPSULE BY MOUTH 2 TIMES DAILY. 180 capsule 1   Evolocumab (REPATHA SURECLICK) 140 MG/ML SOAJ INJECT 140 MG INTO THE SKIN EVERY 14 (FOURTEEN) DAYS. INJECT 1 PEN INTO THE FATTY TISSUE SKIN EVERY 14 DAY 6 mL 3   ferrous sulfate 325 (65 FE) MG EC tablet Take 325 mg by mouth daily with breakfast.     furosemide (LASIX) 40 MG tablet Take 1 tablet (40 mg total) by mouth daily. 30 tablet 1   levothyroxine (SYNTHROID, LEVOTHROID) 137 MCG tablet Take 137 mcg by mouth daily before breakfast.     loratadine (CLARITIN) 10 MG tablet Take 10 mg by mouth daily.     metFORMIN (GLUCOPHAGE) 500 MG tablet Take 500 mg by mouth 2 (two) times daily.  12    Multiple Vitamins-Minerals (CENTRUM SILVER 50+MEN) TABS Take 1 tablet by mouth daily.     nitroGLYCERIN (NITROSTAT) 0.4 MG SL tablet Place 1 tablet (0.4 mg total) under the tongue every 5 (five) minutes x 3 doses as needed for chest pain. 25 tablet 12   pantoprazole (PROTONIX) 40 MG tablet TAKE 1 TABLET BY MOUTH 2 (TWO) TIMES DAILY FOR 7 DAYS, THEN 1 TABLET (40 MG TOTAL) DAILY. 90 tablet 3   Pirfenidone 267 MG TABS Take 1 tab three times daily for 7 days, then 2 tabs three times daily for 7 days, then 3 tabs three times daily thereafter. 270 tablet 0   potassium chloride SA (KLOR-CON M) 20 MEQ tablet Take 1 tablet (20 mEq total) by mouth daily. 30 tablet 1   predniSONE (DELTASONE) 20 MG tablet Take 60 mg (3 tabs) daily for 2 weeks, cut down to 40 mg (2 tabs) daily from 1/17 180 tablet 1   riTUXimab (RITUXAN IV) Inject 1 application  into the vein See admin instructions. Take every two weeks and every six months alternating     Semaglutide (OZEMPIC, 0.25 OR 0.5 MG/DOSE, [place]) Inject 0.5 mg into the skin once a week.     testosterone cypionate (DEPOTESTOSTERONE CYPIONATE) 200 MG/ML injection Inject 150 mg into the muscle every 14 (fourteen) days.     venlafaxine XR (EFFEXOR-XR) 150  MG 24 hr capsule Take 150 mg by mouth daily.     warfarin (COUMADIN) 5 MG tablet TAKE 1/2 TABLET TO 1 TABLET BY MOUTH DAILY AS DIRECTED BY COUMADIN CLINIC (Patient taking differently: Take 2.5-5 mg by mouth See admin instructions. TAKE 1/2 TABLET TO 1 TABLET BY MOUTH DAILY AS DIRECTED BY COUMADIN CLINIC; 2.5 mg Mon Wed Fri; 5 mg all other days) 70 tablet 1   Allergies as of 02/03/2023 - Review Complete 02/03/2023  Allergen Reaction Noted   Farxiga [dapagliflozin] Other (See Comments) 04/07/2022   Statins Other (See Comments) 04/19/2019   Family History  Problem Relation Age of Onset   Heart attack Mother    Other Father        odd stomach disorder   Stroke Maternal Grandmother    Heart attack Maternal Grandfather     Heart attack Paternal Grandmother    Heart attack Paternal Grandfather    Social History   Socioeconomic History   Marital status: Married    Spouse name: Not on file   Number of children: Not on file   Years of education: Not on file   Highest education level: Not on file  Occupational History   Not on file  Tobacco Use   Smoking status: Former    Current packs/day: 0.00    Types: Cigarettes    Quit date: 12/24/1983    Years since quitting: 39.1   Smokeless tobacco: Never  Vaping Use   Vaping status: Never Used  Substance and Sexual Activity   Alcohol use: No    Alcohol/week: 0.0 standard drinks of alcohol   Drug use: No   Sexual activity: Not on file  Other Topics Concern   Not on file  Social History Narrative   Not on file   Social Drivers of Health   Financial Resource Strain: Not on file  Food Insecurity: No Food Insecurity (01/02/2023)   Hunger Vital Sign    Worried About Running Out of Food in the Last Year: Never true    Ran Out of Food in the Last Year: Never true  Transportation Needs: No Transportation Needs (01/02/2023)   PRAPARE - Administrator, Civil Service (Medical): No    Lack of Transportation (Non-Medical): No  Physical Activity: Not on file  Stress: Not on file  Social Connections: Unknown (01/04/2023)   Social Connection and Isolation Panel [NHANES]    Frequency of Communication with Friends and Family: Patient unable to answer    Frequency of Social Gatherings with Friends and Family: Patient unable to answer    Attends Religious Services: Patient unable to answer    Active Member of Clubs or Organizations: Not on file    Attends Banker Meetings: Patient unable to answer    Marital Status: Patient unable to answer  Intimate Partner Violence: Not At Risk (01/02/2023)   Humiliation, Afraid, Rape, and Kick questionnaire    Fear of Current or Ex-Partner: No    Emotionally Abused: No    Physically Abused: No     Sexually Abused: No   Review of Systems:  Review of Systems  Respiratory:  Positive for shortness of breath.   Cardiovascular:  Negative for chest pain.  Gastrointestinal:  Negative for abdominal pain, constipation and diarrhea.    OBJECTIVE:   Temp:  [96.8 F (36 C)-98.1 F (36.7 C)] 97.4 F (36.3 C) (01/31 0916) Pulse Rate:  [66-82] 72 (01/31 0916) Resp:  [16-28] 19 (01/31 0916) BP: (106-157)/(52-87) 132/60 (  01/31 0916) SpO2:  [70 %-100 %] 97 % (01/31 0916) FiO2 (%):  [40 %] 40 % (01/30 2257) Weight:  [94.8 kg] 94.8 kg (01/30 1227)   Physical Exam Constitutional:      General: He is not in acute distress.    Appearance: He is not ill-appearing, toxic-appearing or diaphoretic.  HENT:     Mouth/Throat:     Mouth: Mucous membranes are dry.  Cardiovascular:     Rate and Rhythm: Normal rate and regular rhythm.  Pulmonary:     Effort: No respiratory distress.     Breath sounds: Normal breath sounds.     Comments: Supplemental oxygen nasal cannula. Abdominal:     General: Bowel sounds are normal. There is no distension.     Palpations: Abdomen is soft.     Tenderness: There is no abdominal tenderness. There is no guarding.  Musculoskeletal:     Right lower leg: No edema.     Left lower leg: No edema.  Skin:    General: Skin is warm and dry.  Neurological:     Mental Status: He is alert.     Labs: Recent Labs    02/03/23 1304 02/04/23 0454  WBC 13.8* 11.7*  HGB 8.7* 9.1*  HCT 30.1* 32.5*  PLT 341 337   BMET Recent Labs    02/03/23 1304 02/04/23 0454  NA 140 138  K 3.9 4.0  CL 102 104  CO2 25 22  GLUCOSE 116* 160*  BUN 27* 23  CREATININE 1.04 0.89  CALCIUM 9.2 9.0   LFT Recent Labs    02/03/23 1304  PROT 5.3*  ALBUMIN 3.0*  AST 23  ALT 25  ALKPHOS 72  BILITOT 0.6   PT/INR Recent Labs    02/03/23 1304 02/04/23 0454  LABPROT 29.2* 20.9*  INR 2.7* 1.8*    Diagnostic imaging: DG Chest Portable 1 View Result Date: 02/03/2023 CLINICAL  DATA:  Shortness of breath. Hypoxia. History of Wegener's and interstitial lung disease. EXAM: PORTABLE CHEST 1 VIEW COMPARISON:  Radiographs 01/05/2023 and 01/01/2023.  CT 01/07/2023. FINDINGS: 1252 hours. The heart size and mediastinal contours are stable status post median sternotomy and CABG. Chronic interstitial lung disease again noted with possible slight worsening on the right which could indicate superimposed edema or inflammation. There is no consolidation, pneumothorax or significant pleural effusion. The bones appear unchanged. IMPRESSION: Chronic interstitial lung disease with possible slight worsening on the right which could indicate superimposed edema or inflammation. Electronically Signed   By: Carey Bullocks M.D.   On: 02/03/2023 14:01   IMPRESSION: Dark stools, melena vs iron administration  Normocytic anemia Hypoxemic respiratory failure, interstitial lung disease, history Wegner granulomatosis History multiple AVM (cecum, descending, sigmoid) 2021 (not treated) Paroxysmal atrial fibrillation (on coumadin prior to admission), therapeutic with INR 2.7  PLAN: -Patient would benefit from eventual colonoscopy to treat known AVMs, though not stable for endoscopic procedure(s) given current oxygen requirements  -If melena were to be a concern, would consider addition of EGD as well  -Above discussed with patient, he verbalized understanding -Supportive care with H/H trend, transfusion for Hgb < 8, appears iron replete -Liberalize diet -Ok to use heparin/coumadin from GI perspective -Suspect colonoscopy/EGD could be completed in outpatient hospital setting, he can follow up with myself in GI office in roughly 4-6 weeks after discharge to discuss timing (will place information in chart) -[facility] will sign off and be available for any questions that arise     LOS: 1 day  Liliane Shi, DO [facility] Gastroenterology

## 2023-02-04 NOTE — Progress Notes (Signed)
PHARMACY - ANTICOAGULATION CONSULT NOTE  Pharmacy Consult for Heparin Indication: atrial fibrillation  Allergies  Allergen Reactions   Farxiga [Dapagliflozin] Other (See Comments)    Lightheadedness Low blood pressure   Statins Other (See Comments)    Myalgias     Patient Measurements: Height: 5\' 10"  (177.8 cm) Weight: 91.5 kg (201 lb 11.5 oz) IBW/kg (Calculated) : 73 Heparin Dosing Weight: 90.3kg  Vital Signs: Temp: 97.7 F (36.5 C) (01/31 2023) Temp Source: Oral (01/31 2023) BP: 139/53 (01/31 1906) Pulse Rate: 67 (01/31 2023)  Labs: Recent Labs    02/03/23 1304 02/03/23 1443 02/03/23 1911 02/03/23 2341 02/04/23 0454 02/04/23 1959  HGB 8.7*  --   --   --  9.1*  --   HCT 30.1*  --   --   --  32.5*  --   PLT 341  --   --   --  337  --   LABPROT 29.2*  --   --   --  20.9*  --   INR 2.7*  --   --   --  1.8*  --   HEPARINUNFRC  --   --   --   --   --  0.21*  CREATININE 1.04  --   --   --  0.89  --   TROPONINIHS 31* 31* 37* 26*  --   --     Estimated Creatinine Clearance: 76.5 mL/min (by C-G formula based on SCr of 0.89 mg/dL).   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    Medications:  Scheduled:   ALPRAZolam  0.5 mg Oral QHS   aspirin  81 mg Oral Daily   carvedilol  3.125 mg Oral BID WC   clopidogrel  75 mg Oral Daily   dapsone  100 mg Oral Daily   dofetilide  500 mcg Oral BID   feeding supplement  237 mL Oral BID BM   furosemide  40 mg Intravenous  Daily   insulin aspart  0-5 Units Subcutaneous QHS   insulin aspart  0-9 Units Subcutaneous TID WC   levothyroxine  137 mcg Oral QAC breakfast   methylPREDNISolone (SOLU-MEDROL) injection  125 mg Intravenous Q12H   pantoprazole  40 mg Oral BID   venlafaxine XR  150 mg Oral Daily    Assessment:  [id number] with history of atrial fibrillation on warfarin who presents with SOB. Last warfarin dose PTA was 1/29. Patient reports dark stools PTA. Due to concern for GIB, pharmacy has been consulted to start heparin drip once INR < 2.  INR 1.8, Hgb stable, up slightly 8.7>9.1. GI and primary team ok with starting heparin infusion now.   1/31 PM update: HL 0.21 No signs of bleeding or issues w/ heparin gtt  Goal of Therapy:  Heparin level 0.3-0.7 units/ml Monitor platelets by anticoagulation protocol: Yes   Plan:  Increase heparin gtt to 1450 units/hr Heparin level in 8 hours and daily with  CBC F/u INR daily Monitor daily CBC and s/sx of bleeding  Jennell Janosik BS, PharmD, BCPS Clinical Pharmacist 02/04/2023 9:25 PM  Contact: [phone] after 3 PM  "Be curious, not judgmental..." -Debbora Dus

## 2023-02-05 DIAGNOSIS — M3130 Wegener's granulomatosis without renal involvement: Secondary | ICD-10-CM | POA: Diagnosis not present

## 2023-02-05 DIAGNOSIS — J9621 Acute and chronic respiratory failure with hypoxia: Secondary | ICD-10-CM | POA: Diagnosis not present

## 2023-02-05 DIAGNOSIS — J849 Interstitial pulmonary disease, unspecified: Secondary | ICD-10-CM | POA: Diagnosis not present

## 2023-02-05 LAB — CBC WITH DIFFERENTIAL/PLATELET
Abs Immature Granulocytes: 0.2 10*3/uL — ABNORMAL HIGH (ref 0.00–0.07)
Basophils Absolute: 0 10*3/uL (ref 0.0–0.1)
Basophils Relative: 0 %
Eosinophils Absolute: 0 10*3/uL (ref 0.0–0.5)
Eosinophils Relative: 0 %
HCT: 29.9 % — ABNORMAL LOW (ref 39.0–52.0)
Hemoglobin: 8.6 g/dL — ABNORMAL LOW (ref 13.0–17.0)
Immature Granulocytes: 1 %
Lymphocytes Relative: 6 %
Lymphs Abs: 0.8 10*3/uL (ref 0.7–4.0)
MCH: 24.6 pg — ABNORMAL LOW (ref 26.0–34.0)
MCHC: 28.8 g/dL — ABNORMAL LOW (ref 30.0–36.0)
MCV: 85.4 fL (ref 80.0–100.0)
Monocytes Absolute: 0.7 10*3/uL (ref 0.1–1.0)
Monocytes Relative: 5 %
Neutro Abs: 12.8 10*3/uL — ABNORMAL HIGH (ref 1.7–7.7)
Neutrophils Relative %: 88 %
Platelets: 391 10*3/uL (ref 150–400)
RBC: 3.5 MIL/uL — ABNORMAL LOW (ref 4.22–5.81)
RDW: 23.2 % — ABNORMAL HIGH (ref 11.5–15.5)
WBC: 14.5 10*3/uL — ABNORMAL HIGH (ref 4.0–10.5)
nRBC: 0.5 % — ABNORMAL HIGH (ref 0.0–0.2)

## 2023-02-05 LAB — GLUCOSE, CAPILLARY
Glucose-Capillary: 166 mg/dL — ABNORMAL HIGH (ref 70–99)
Glucose-Capillary: 304 mg/dL — ABNORMAL HIGH (ref 70–99)
Glucose-Capillary: 144 mg/dL — ABNORMAL HIGH (ref 70–99)
Glucose-Capillary: 206 mg/dL — ABNORMAL HIGH (ref 70–99)

## 2023-02-05 LAB — HEPARIN LEVEL (UNFRACTIONATED)
Heparin Unfractionated: 0.92 [IU]/mL — ABNORMAL HIGH (ref 0.30–0.70)
Heparin Unfractionated: 0.94 [IU]/mL — ABNORMAL HIGH (ref 0.30–0.70)
Heparin Unfractionated: 0.58 [IU]/mL (ref 0.30–0.70)

## 2023-02-05 LAB — BASIC METABOLIC PANEL
Anion gap: 15 (ref 5–15)
BUN: 28 mg/dL — ABNORMAL HIGH (ref 8–23)
CO2: 23 mmol/L (ref 22–32)
Calcium: 9.2 mg/dL (ref 8.9–10.3)
Chloride: 102 mmol/L (ref 98–111)
Creatinine, Ser: 1.13 mg/dL (ref 0.61–1.24)
GFR, Estimated: >60 mL/min (ref >60)
Glucose, Bld: 161 mg/dL — ABNORMAL HIGH (ref 70–99)
Potassium: 3.9 mmol/L (ref 3.5–5.1)
Sodium: 140 mmol/L (ref 135–145)

## 2023-02-05 LAB — PHOSPHORUS: Phosphorus: 3.8 mg/dL (ref 2.5–4.6)

## 2023-02-05 LAB — BRAIN NATRIURETIC PEPTIDE: B Natriuretic Peptide: 169.1 pg/mL — ABNORMAL HIGH (ref 0.0–100.0)

## 2023-02-05 LAB — PROCALCITONIN: Procalcitonin: 0.12 ng/mL

## 2023-02-05 LAB — C-REACTIVE PROTEIN: CRP: 8.6 mg/dL — ABNORMAL HIGH (ref <1.0)

## 2023-02-05 LAB — MAGNESIUM: Magnesium: 2.4 mg/dL (ref 1.7–2.4)

## 2023-02-05 MED ORDER — OXIDIZED CELLULOSE EX PADS
1.0000 | MEDICATED_PAD | CUTANEOUS | Status: AC
  Administered 2023-02-05: 1 via TOPICAL
  Filled 2023-02-05: qty 1

## 2023-02-05 MED ORDER — POTASSIUM CHLORIDE CRYS ER 20 MEQ PO TBCR
40.0000 meq | EXTENDED_RELEASE_TABLET | Freq: Once | ORAL | Status: AC
  Administered 2023-02-05: 40 meq via ORAL
  Filled 2023-02-05: qty 2

## 2023-02-05 MED ORDER — MYCOPHENOLATE MOFETIL 250 MG PO CAPS
500.0000 mg | ORAL_CAPSULE | Freq: Two times a day (BID) | ORAL | Status: DC
  Administered 2023-02-05 – 2023-02-14 (×18): 500 mg via ORAL
  Filled 2023-02-05 (×18): qty 2

## 2023-02-05 MED ORDER — OXIDIZED CELLULOSE EX PADS
1.0000 | MEDICATED_PAD | Freq: Once | CUTANEOUS | Status: DC
  Filled 2023-02-05: qty 1

## 2023-02-05 NOTE — Progress Notes (Signed)
Patient has declined to wear Cpap for the night

## 2023-02-05 NOTE — Progress Notes (Signed)
PHARMACY - ANTICOAGULATION CONSULT NOTE  Pharmacy Consult for Heparin Indication: atrial fibrillation  Allergies  Allergen Reactions   Farxiga [Dapagliflozin] Other (See Comments)    Lightheadedness Low blood pressure   Statins Other (See Comments)    Myalgias     Patient Measurements: Height: 5\' 10"  (177.8 cm) Weight: 91.5 kg (201 lb 11.5 oz) IBW/kg (Calculated) : 73 Heparin Dosing Weight: 90.3kg  Vital Signs: Temp: 97.9 F (36.6 C) (02/01 0500) Temp Source: Oral (02/01 0500) BP: 135/73 (02/01 0500) Pulse Rate: 65 (02/01 0500)  Labs: Recent Labs    02/03/23 1304 02/03/23 1443 02/03/23 1911 02/03/23 2341 02/04/23 0454 02/04/23 1959 02/05/23 0531 02/05/23 1304  HGB 8.7*  --   --   --  9.1*  --  8.6*  --   HCT 30.1*  --   --   --  32.5*  --  29.9*  --   PLT 341  --   --   --  337  --  391  --   LABPROT 29.2*  --   --   --  20.9*  --   --   --   INR 2.7*  --   --   --  1.8*  --   --   --   HEPARINUNFRC  --   --   --   --   --  0.21* 0.92* 0.94*  CREATININE 1.04  --   --   --  0.89  --  1.13  --   TROPONINIHS 31* 31* 37* 26*  --   --   --   --     Estimated Creatinine Clearance: 60.3 mL/min (by C-G formula based on SCr of 1.13 mg/dL).   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    Medications:  Scheduled:   ALPRAZolam  0.5 mg Oral QHS   aspirin  81 mg Oral Daily   carvedilol  3.125 mg Oral BID WC   clopidogrel  75 mg Oral Daily   dapsone  100 mg Oral  Daily   dofetilide  500 mcg Oral BID   feeding supplement  237 mL Oral BID BM   furosemide  40 mg Intravenous Daily   insulin aspart  0-5 Units Subcutaneous QHS   insulin aspart  0-9 Units Subcutaneous TID WC   levothyroxine  137 mcg Oral QAC breakfast   methylPREDNISolone (SOLU-MEDROL) injection  125 mg Intravenous Q12H   pantoprazole  40 mg Oral BID   venlafaxine XR  150 mg Oral Daily    Assessment: 77YOM with history of atrial fibrillation on warfarin who presents with SOB. Last warfarin dose PTA was 1/29. Patient reports dark stools PTA. Due to concern for GIB, pharmacy has been consulted to start heparin drip.  HL remains supratherapeutic (0.94) 6hrs after dose decrease to 1200 units/hr. Verified rate on pump and confirmed with patient that level was drawn from opposite arm of heparin infusion. Spontaneous bleeding  from site where PICC was removed remains resolved with pressure and packed gauze. No issues with infusion per RN.  Goal of Therapy:  Heparin level 0.3-0.7 units/ml Monitor platelets by anticoagulation protocol: Yes   Plan:  Decrease heparin gtt to 1000 units/hr Heparin level in 8 hours and daily with CBC Monitor daily CBC and s/sx of bleeding F/u plans to transition back to warfarin  Nicole Kindred, PharmD PGY1 Pharmacy Resident 02/05/2023 2:19 PM

## 2023-02-05 NOTE — Progress Notes (Signed)
PROGRESS NOTE    Gary [name]  ZDG:[id number] DOB: 1945/01/20 DOA: 02/03/2023 PCP: Charlane Ferretti, DO  Outpatient Specialists:     Brief Narrative:  Patient is a 78 year old male, with past medical history significant for Wegener's granulomatosis, interstitial lung disease chronic hypoxic respiratory failure on 2 to 3 L of supplemental oxygen via nasal cannula at home, coronary artery disease status post CABG and chronic diastolic congestive heart failure, amongst other medical and cardiac history.  Patient was readmitted with ILD flare.  Apparently, patient was discharged recently, and was on tapering dose of prednisone.  Input from the pulmonary team is highly appreciated.  Patient is usually maintained on rituximab and steroids.  Patient is currently on IV Solu-Medrol 125 Mg daily.  Shortness of breath is slowly improving.  02/04/2023: Patient was seen alongside patient's son.  Patient becomes dyspneic with minimal exertion.  Will continue IV Solu-Medrol for now.  02/05/2023: Patient seen.  Patient is slowly improving.  Continue IV steroids for now.  Pulmonary input is appreciated.   Assessment & Plan:   Principal Problem:   Acute and chronic respiratory failure with hypoxia (HCC)  Acute on chronic respiratory failure: ILD/Wegener's granulomatosis: -Patient has been maintained on rituximab and prednisone. -Recent admission for ILD flare. -Patient has been readmitted with worsening shortness of breath. -Patient was on tapering dose of prednisone prior to readmission. -Continue IV Solu-Medrol. -Pulmonary/critical care teams input is highly appreciated. 02/05/2023: Patient is slowly improving.  Continue IV steroids.  Iron deficiency anemia: -GI input is appreciated. -Further workup as per GI team.  Chronic atrial fibrillation: -Patient is on Tikosyn. -Continue heparin.  History of coronary artery disease status post PCI and CABG: -Stable.  Hypothyroidism: -Continue  Synthroid.  Depression and anxiety: -Stable. -Patient is on Effexor.  Type 2 diabetes mellitus: -Last A1c of 5.9%. -Monitor blood sugar closely as patient is currently on IV Solu-Medrol.   DVT prophylaxis: Heparin. Code Status: Full code. Family Communication: None. Disposition Plan: Patient remains inpatient.  Severe and multiple comorbidities.   Consultants:  Pulmonary/critical care team GI  Procedures:  None.  Antimicrobials:  None.   Subjective: -Shortness of breath and dyspnea on exertion is slowly improving.    Objective: Vitals:   02/04/23 1906 02/04/23 2023 02/05/23 0057 02/05/23 0500  BP: (!) 139/53  (!) 146/74 135/73  Pulse: 74 67 70 65  Resp: (!) 32 (!) 23 (!) 22 (!) 22  Temp:  97.7 F (36.5 C) 98.1 F (36.7 C) 97.9 F (36.6 C)  TempSrc:  Oral Oral Oral  SpO2:  97% 99% 95%  Weight: 91.5 kg     Height: 5\' 10"  (1.778 m)       Intake/Output Summary (Last 24 hours) at 02/05/2023 2035 Last data filed at 02/05/2023 1200 Gross per 24 hour  Intake 630.08 ml  Output 1240 ml  Net -609.92 ml   Filed Weights   02/03/23 1227 02/04/23 1906  Weight: 94.8 kg 91.5 kg    Examination:  General exam: Appears calm and comfortable.  Patient gets dyspneic on speaking. Respiratory system: Clear to auscultation.   Cardiovascular system: S1 & S2  Gastrointestinal system: Abdomen is obese, soft and nontender.   Central nervous system: Awake and alert.  Patient moves all extremities.     Data Reviewed: I have personally reviewed following labs and imaging studies  CBC: Recent Labs  Lab 02/03/23 1304 02/04/23 0454 02/05/23 0531  WBC 13.8* 11.7* 14.5*  NEUTROABS 11.1* 10.4* 12.8*  HGB 8.7* 9.1* 8.6*  HCT 30.1* 32.5* 29.9*  MCV 86.0 87.4 85.4  PLT 341 337 391   Basic Metabolic Panel: Recent Labs  Lab 02/03/23 1304 02/03/23 1443 02/04/23 0454 02/05/23 0531  NA 140  --  138 140  K 3.9  --  4.0 3.9  CL 102  --  104 102  CO2 25  --  22 23  GLUCOSE  116*  --  160* 161*  BUN 27*  --  23 28*  CREATININE 1.04  --  0.89 1.13  CALCIUM 9.2  --  9.0 9.2  MG  --  2.3 2.2 2.4  PHOS  --   --  4.2 3.8   GFR: Estimated Creatinine Clearance: 60.3 mL/min (by C-G formula based on SCr of 1.13 mg/dL). Liver Function Tests: Recent Labs  Lab 02/03/23 1304  AST 23  ALT 25  ALKPHOS 72  BILITOT 0.6  PROT 5.3*  ALBUMIN 3.0*   No results for input(s): "LIPASE", "AMYLASE" in the last 168 hours. No results for input(s): "AMMONIA" in the last 168 hours. Coagulation Profile: Recent Labs  Lab 02/01/23 0000 02/03/23 1304 02/04/23 0454  INR 2.50* 2.7* 1.8*   Cardiac Enzymes: No results for input(s): "CKTOTAL", "CKMB", "CKMBINDEX", "TROPONINI" in the last 168 hours. BNP (last 3 results) Recent Labs    11/04/22 1519  PROBNP 416.0*   HbA1C: No results for input(s): "HGBA1C" in the last 72 hours. CBG: Recent Labs  Lab 02/04/23 2021 02/05/23 0908 02/05/23 1159 02/05/23 1645 02/05/23 2033  GLUCAP 207* 166* 304* 144* 206*   Lipid Profile: No results for input(s): "CHOL", "HDL", "LDLCALC", "TRIG", "CHOLHDL", "LDLDIRECT" in the last 72 hours. Thyroid Function Tests: No results for input(s): "TSH", "T4TOTAL", "FREET4", "T3FREE", "THYROIDAB" in the last 72 hours. Anemia Panel: Recent Labs    02/03/23 1443 02/03/23 1911  VITAMINB12  --  536  FOLATE 15.7  --   FERRITIN  --  470*  TIBC  --  412  IRON  --  162  RETICCTPCT  --  5.6*   Urine analysis:    Component Value Date/Time   COLORURINE YELLOW 09/17/2022 2236   APPEARANCEUR HAZY (A) 09/17/2022 2236   LABSPEC 1.010 09/17/2022 2236   PHURINE 6.5 09/17/2022 2236   GLUCOSEU NEGATIVE 09/17/2022 2236   HGBUR NEGATIVE 09/17/2022 2236   BILIRUBINUR NEGATIVE 09/17/2022 2236   KETONESUR NEGATIVE 09/17/2022 2236   PROTEINUR NEGATIVE 09/17/2022 2236   UROBILINOGEN 0.2 12/24/2011 0805   NITRITE NEGATIVE 09/17/2022 2236   LEUKOCYTESUR SMALL (A) 09/17/2022 2236   Sepsis  Labs: @LABRCNTIP (procalcitonin:4,lacticidven:4)  ) Recent Results (from the past 240 hours)  Resp panel by RT-PCR (RSV, Flu A&B, Covid) Anterior Nasal Swab     Status: None   Collection Time: 02/03/23 12:16 PM   Specimen: Anterior Nasal Swab  Result Value Ref Range Status   SARS Coronavirus 2 by RT PCR NEGATIVE NEGATIVE Final   Influenza A by PCR NEGATIVE NEGATIVE Final   Influenza B by PCR NEGATIVE NEGATIVE Final    Comment: (NOTE) The Xpert Xpress SARS-CoV-2/FLU/RSV plus assay is intended as an aid in the diagnosis of influenza from Nasopharyngeal swab specimens and should not be used as a sole basis for treatment. Nasal washings and aspirates are unacceptable for Xpert Xpress SARS-CoV-2/FLU/RSV testing.  Fact Sheet for Patients: BloggerCourse.com  Fact Sheet for Healthcare Providers: SeriousBroker.it  This test is not yet approved or cleared by the [place] FDA and has been authorized for detection and/or diagnosis of SARS-CoV-2 by FDA under an  Emergency Use Authorization (EUA). This EUA will remain in effect (meaning this test can be used) for the duration of the COVID-19 declaration under Section 564(b)(1) of the Act, 21 U.S.C. section 360bbb-3(b)(1), unless the authorization is terminated or revoked.     Resp Syncytial Virus by PCR NEGATIVE NEGATIVE Final    Comment: (NOTE) Fact Sheet for Patients: BloggerCourse.com  Fact Sheet for Healthcare Providers: SeriousBroker.it  This test is not yet approved or cleared by the [place] FDA and has been authorized for detection and/or diagnosis of SARS-CoV-2 by FDA under an Emergency Use Authorization (EUA). This EUA will remain in effect (meaning this test can be used) for the duration of the COVID-19 declaration under Section 564(b)(1) of the Act, 21 U.S.C. section 360bbb-3(b)(1), unless the authorization is  terminated or revoked.  Performed at [facility], [address] [place], [place] [address]   MRSA Next Gen by PCR, Nasal     Status: None   Collection Time: 02/03/23  2:44 PM   Specimen: Nasal Mucosa; Nasal Swab  Result Value Ref Range Status   MRSA by PCR Next Gen NOT DETECTED NOT DETECTED Final    Comment: (NOTE) The GeneXpert MRSA Assay (FDA approved for NASAL specimens only), is one component of a comprehensive MRSA colonization surveillance program. It is not intended to diagnose MRSA infection nor to guide or monitor treatment for MRSA infections. Test performance is not FDA approved in patients less than 40 years old. Performed at [facility], [address], [place], [place] [address]   Respiratory (~20 pathogens) panel by PCR     Status: None   Collection Time: 02/03/23  3:22 PM   Specimen: Nasopharyngeal Swab; Respiratory  Result Value Ref Range Status   Adenovirus NOT DETECTED NOT DETECTED Final   Coronavirus 229E NOT DETECTED NOT DETECTED Final    Comment: (NOTE) The Coronavirus on the Respiratory Panel, DOES NOT test for the novel  Coronavirus (2019 nCoV)    Coronavirus HKU1 NOT DETECTED NOT DETECTED Final   Coronavirus NL63 NOT DETECTED NOT DETECTED Final   Coronavirus OC43 NOT DETECTED NOT DETECTED Final   Metapneumovirus NOT DETECTED NOT DETECTED Final   Rhinovirus / Enterovirus NOT DETECTED NOT DETECTED Final   Influenza A NOT DETECTED NOT DETECTED Final   Influenza B NOT DETECTED NOT DETECTED Final   Parainfluenza Virus 1 NOT DETECTED NOT DETECTED Final   Parainfluenza Virus 2 NOT DETECTED NOT DETECTED Final   Parainfluenza Virus 3 NOT DETECTED NOT DETECTED Final   Parainfluenza Virus 4 NOT DETECTED NOT DETECTED Final   Respiratory Syncytial Virus NOT DETECTED NOT DETECTED Final   Bordetella pertussis NOT DETECTED NOT DETECTED Final   Bordetella Parapertussis NOT DETECTED NOT DETECTED Final   Chlamydophila pneumoniae NOT DETECTED NOT  DETECTED Final   Mycoplasma pneumoniae NOT DETECTED NOT DETECTED Final    Comment: Performed at [facility], [address], [place], [place] [address]         Radiology Studies: No results found.       Scheduled Meds:  ALPRAZolam  0.5 mg Oral QHS   aspirin  81 mg Oral Daily   carvedilol  3.125 mg Oral BID WC   clopidogrel  75 mg Oral Daily   dapsone  100 mg Oral Daily   dofetilide  500 mcg Oral BID   feeding supplement  237 mL Oral BID BM   insulin aspart  0-5 Units Subcutaneous QHS   insulin aspart  0-9 Units Subcutaneous TID WC   levothyroxine  137 mcg Oral QAC breakfast   methylPREDNISolone (SOLU-MEDROL) injection  125 mg Intravenous Q12H   mycophenolate  500 mg Oral BID   pantoprazole  40 mg Oral BID   venlafaxine XR  150 mg Oral Daily   Continuous Infusions:  heparin 1,000 Units/hr (02/05/23 1436)     LOS: 2 days    Time spent: 35 minutes.    Berton Mount, MD  [facility] Pager #: [phone] 7PM-7AM contact night coverage as above

## 2023-02-05 NOTE — Progress Notes (Signed)
PHARMACY - ANTICOAGULATION CONSULT NOTE  Pharmacy Consult for heparin Indication: atrial fibrillation  Labs: Recent Labs    02/03/23 1304 02/03/23 1443 02/03/23 1911 02/03/23 2341 02/04/23 0454 02/04/23 1959 02/05/23 0531 02/05/23 1304 02/05/23 2305  HGB 8.7*  --   --   --  9.1*  --  8.6*  --   --   HCT 30.1*  --   --   --  32.5*  --  29.9*  --   --   PLT 341  --   --   --  337  --  391  --   --   LABPROT 29.2*  --   --   --  20.9*  --   --   --   --   INR 2.7*  --   --   --  1.8*  --   --   --   --   HEPARINUNFRC  --   --   --   --   --    < > 0.92* 0.94* 0.58  CREATININE 1.04  --   --   --  0.89  --  1.13  --   --   TROPONINIHS 31* 31* 37* 26*  --   --   --   --   --    < > = values in this interval not displayed.   Assessment/Plan:  78yo male therapeutic on heparin after rate changes. Will continue infusion at current rate of 1000 units/hr and confirm stable with am labs.  Vernard Gambles, PharmD, BCPS 02/05/2023 11:53 PM

## 2023-02-05 NOTE — Plan of Care (Signed)
  Problem: Education: Goal: Ability to describe self-care measures that may prevent or decrease complications (Diabetes Survival Skills Education) will improve Outcome: Progressing Goal: Individualized Educational Video(s) Outcome: Progressing   Problem: Coping: Goal: Ability to adjust to condition or change in health will improve Outcome: Progressing   Problem: Fluid Volume: Goal: Ability to maintain a balanced intake and output will improve Outcome: Progressing   Problem: Health Behavior/Discharge Planning: Goal: Ability to identify and utilize available resources and services will improve Outcome: Progressing Goal: Ability to manage health-related needs will improve Outcome: Progressing   Problem: Nutritional: Goal: Maintenance of adequate nutrition will improve Outcome: Progressing Goal: Progress toward achieving an optimal weight will improve Outcome: Progressing   Problem: Skin Integrity: Goal: Risk for impaired skin integrity will decrease Outcome: Progressing

## 2023-02-05 NOTE — Plan of Care (Signed)
  Problem: Metabolic: Goal: Ability to maintain appropriate glucose levels will improve Outcome: Progressing   Problem: Tissue Perfusion: Goal: Adequacy of tissue perfusion will improve Outcome: Progressing   Problem: Clinical Measurements: Goal: Cardiovascular complication will be avoided Outcome: Progressing

## 2023-02-05 NOTE — Progress Notes (Signed)
PHARMACY - ANTICOAGULATION CONSULT NOTE  Pharmacy Consult for heparin Indication: atrial fibrillation  Labs: Recent Labs    02/03/23 1304 02/03/23 1443 02/03/23 1911 02/03/23 2341 02/04/23 0454 02/04/23 1959 02/05/23 0531  HGB 8.7*  --   --   --  9.1*  --  8.6*  HCT 30.1*  --   --   --  32.5*  --  29.9*  PLT 341  --   --   --  337  --  391  LABPROT 29.2*  --   --   --  20.9*  --   --   INR 2.7*  --   --   --  1.8*  --   --   HEPARINUNFRC  --   --   --   --   --  0.21* 0.92*  CREATININE 1.04  --   --   --  0.89  --  1.13  TROPONINIHS 31* 31* 37* 26*  --   --   --    Assessment: 77yo male supratherapeutic on heparin after rate increase; RN notes spontaneous bleeding from site where PICC was removed despite pressure dressing, resolved with pressure and Surgicel pad.  Goal of Therapy:  Heparin level 0.3-0.7 units/ml   Plan:  Decrease heparin infusion by 3 units/kg/hr to 1200 units/hr. Check level in 6 hours.   Vernard Gambles, PharmD, BCPS 02/05/2023 7:00 AM

## 2023-02-05 NOTE — Progress Notes (Addendum)
Patient  arrived from ER is alert, awake ambulatory with assistance to [facility]. Patient is on Heparin drip @ 14.5 ml/hr, no S/S of bleeding. New Peripheral IV placed on Right arm. Skin is intact. Mepilex applied on bilateral heels. Oxygen sat 98 % on 6 L high flow [place]. @ 0519 PT had bleeding on PICC line site has been removed 12hours ago at ER. MD notified as well as Pharmacy. Applied surgicel per ordered. PT has SOB ,  Oxygen sat dropped to 87 % at  6 L high flow [place] when PT sat up on bed to use urinal. Oxygen sat up to 97 at 10 L High flow [place]. Patient has right second toe amputation. Heparin drip decreased to 12 ml/hr per pharmacy @ 0703.370 mls urine out put.

## 2023-02-05 NOTE — Progress Notes (Signed)
NAME:  Gary [name], MRN:  [id number], DOB:  Aug 25, 1945, LOS: 2 ADMISSION DATE:  02/03/2023, CONSULTATION DATE:  02/03/2023 REFERRING MD:  Dr. Susa Raring, CHIEF COMPLAINT:  dyspnea, hypoxia   History of Present Illness:  This 78 year old Caucasian male is seen in consultation at the request of Dr. Susa Raring for recommendations on further evaluation and management of hypoxia.  The patient was sent to [facility] emergency department from [facility] pulmonary clinic today, after he presented with reports of having profound hypoxia at home (down to SpO2 45%).  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  He is on rituximab and steroids.  In fact, he was recently hospitalized for interstitial lung disease flareup and has been on a prednisone taper, currently on prednisone 40 mg p.o. daily.  Treated with high-dose steroids.  Right heart cath on 01/04/2023 showed normal filling pressures, mild pulmonary hypertension and preserved cardiac output.  He reported doing well after his recent hospitalization and felt that he had a very good day yesterday.  However, this morning he woke up and experienced extreme exertional dyspnea while getting from his bed to the bathroom, when he noted that his SpO2 went down to 45%.  He is normally on supplemental oxygen at 2-2.5 LPM via nasal cannula.  He does titrate as needed to maintain SpO2 90%.  However, he noted that despite turning his supplemental oxygen up to 6 LPM (which he typically needs during ambulation), he was not recovering.  He denies cough.  He denies pleuritic pain.  He denies sputum production.  He denies chest pain.  He does state that when he was profoundly hypoxic he did notice that he was experiencing blurring of vision.  He has noted some modest increased swelling in his lower extremities.  He does confess that he is prescribed daily furosemide but is inconsistent with adherence to  prescribed therapy.  Patient says he has had a very stressful and very active week, which revolves around having to get to multiple medical appointments.  He had an iron infusion a few days ago.  He also had a Rituxan infusion earlier this week.  Patient says he has been having blood in his stools, although he says that this is not any more than usual.  This has been a chronic problem ever since his diagnosis of Wegener's granulomatosis.    He is on Coumadin for chronic atrial fibrillation.  His last INR check (01/24/2023) was supratherapeutic, 6.7.  However, upon recheck in the emergency department, this has fallen into the therapeutic range, 2.7.  Last hemoglobin reported by patient was 8.2, currently 8.7.   Pertinent  Medical History  PAST MEDICAL history significant for Wegener's granulomatosis (diagnosed 2019, on Rituxan and steroids), type 2 diabetes mellitus, OSA on CPAP, atrial fibrillation on coumadin, HFpEF (LVEF 65 in 12/28/2022, grade 1 diastolic dysfunction), pulmonary hypertension, coronary artery disease.  Significant Hospital Events: Including procedures, antibiotic start and stop dates in addition to other pertinent events   1/30 presented with shortness of breath and hypoxia to steroids started 1/31 states he feels well but is requiring 8 to 10 L high flow nasal cannula  Interim History / Subjective:  No events, feels a little better today.  Objective   Blood pressure 135/73, pulse 65, temperature 97.9 F (36.6 C), temperature source Oral, resp. rate (!) 22, height 5\' 10"  (1.778 m), weight 91.5 kg, SpO2 95%.        Intake/Output Summary (  Last 24 hours) at 02/05/2023 1455 Last data filed at 02/05/2023 1200 Gross per 24 hour  Intake 630.08 ml  Output 1240 ml  Net -609.92 ml   Filed Weights   02/03/23 1227 02/04/23 1906  Weight: 94.8 kg 91.5 kg    Examination: No distress Moves to command Crackles bases Minimal edema  BUN/Cr bump noted   Resolved Hospital Problem  list   Not applicable.  Assessment & Plan:  GPA in flare Hx afib on AC/tikosyn  - Continue steroids - Hold further lasix - Dapsone PJP ppx - Wean O2 for sats < 88, okay to tolerate lower as patient knows how to recover - Discussed with [name]; start cellcept and likely hold off on further rituxan in future  Myrla Halsted MD PCCM

## 2023-02-06 ENCOUNTER — Other Ambulatory Visit (HOSPITAL_BASED_OUTPATIENT_CLINIC_OR_DEPARTMENT_OTHER): Payer: Self-pay | Admitting: Pulmonary Disease

## 2023-02-06 DIAGNOSIS — I5032 Chronic diastolic (congestive) heart failure: Secondary | ICD-10-CM | POA: Diagnosis not present

## 2023-02-06 DIAGNOSIS — J849 Interstitial pulmonary disease, unspecified: Secondary | ICD-10-CM | POA: Diagnosis not present

## 2023-02-06 DIAGNOSIS — J9621 Acute and chronic respiratory failure with hypoxia: Secondary | ICD-10-CM | POA: Diagnosis not present

## 2023-02-06 DIAGNOSIS — M3130 Wegener's granulomatosis without renal involvement: Secondary | ICD-10-CM | POA: Diagnosis not present

## 2023-02-06 LAB — GLUCOSE, CAPILLARY
Glucose-Capillary: 186 mg/dL — ABNORMAL HIGH (ref 70–99)
Glucose-Capillary: 213 mg/dL — ABNORMAL HIGH (ref 70–99)
Glucose-Capillary: 150 mg/dL — ABNORMAL HIGH (ref 70–99)
Glucose-Capillary: 186 mg/dL — ABNORMAL HIGH (ref 70–99)

## 2023-02-06 LAB — CBC WITH DIFFERENTIAL/PLATELET
Abs Immature Granulocytes: 0.32 10*3/uL — ABNORMAL HIGH (ref 0.00–0.07)
Basophils Absolute: 0 10*3/uL (ref 0.0–0.1)
Basophils Relative: 0 %
Eosinophils Absolute: 0 10*3/uL (ref 0.0–0.5)
Eosinophils Relative: 0 %
HCT: 30.5 % — ABNORMAL LOW (ref 39.0–52.0)
Hemoglobin: 8.8 g/dL — ABNORMAL LOW (ref 13.0–17.0)
Immature Granulocytes: 2 %
Lymphocytes Relative: 5 %
Lymphs Abs: 0.7 10*3/uL (ref 0.7–4.0)
MCH: 24.9 pg — ABNORMAL LOW (ref 26.0–34.0)
MCHC: 28.9 g/dL — ABNORMAL LOW (ref 30.0–36.0)
MCV: 86.4 fL (ref 80.0–100.0)
Monocytes Absolute: 0.9 10*3/uL (ref 0.1–1.0)
Monocytes Relative: 6 %
Neutro Abs: 13.8 10*3/uL — ABNORMAL HIGH (ref 1.7–7.7)
Neutrophils Relative %: 87 %
Platelets: 412 10*3/uL — ABNORMAL HIGH (ref 150–400)
RBC: 3.53 MIL/uL — ABNORMAL LOW (ref 4.22–5.81)
RDW: 23.6 % — ABNORMAL HIGH (ref 11.5–15.5)
WBC: 15.7 10*3/uL — ABNORMAL HIGH (ref 4.0–10.5)
nRBC: 0.6 % — ABNORMAL HIGH (ref 0.0–0.2)

## 2023-02-06 LAB — BRAIN NATRIURETIC PEPTIDE: B Natriuretic Peptide: 189.5 pg/mL — ABNORMAL HIGH (ref 0.0–100.0)

## 2023-02-06 LAB — BASIC METABOLIC PANEL
Anion gap: 10 (ref 5–15)
BUN: 33 mg/dL — ABNORMAL HIGH (ref 8–23)
CO2: 25 mmol/L (ref 22–32)
Calcium: 9.1 mg/dL (ref 8.9–10.3)
Chloride: 104 mmol/L (ref 98–111)
Creatinine, Ser: 1.03 mg/dL (ref 0.61–1.24)
GFR, Estimated: >60 mL/min (ref >60)
Glucose, Bld: 150 mg/dL — ABNORMAL HIGH (ref 70–99)
Potassium: 3.9 mmol/L (ref 3.5–5.1)
Sodium: 139 mmol/L (ref 135–145)

## 2023-02-06 LAB — PROTIME-INR
INR: 1.2 (ref 0.8–1.2)
Prothrombin Time: 15.4 s — ABNORMAL HIGH (ref 11.4–15.2)

## 2023-02-06 LAB — HEPARIN LEVEL (UNFRACTIONATED): Heparin Unfractionated: 0.57 [IU]/mL (ref 0.30–0.70)

## 2023-02-06 LAB — C-REACTIVE PROTEIN: CRP: 4.1 mg/dL — ABNORMAL HIGH (ref <1.0)

## 2023-02-06 LAB — MAGNESIUM: Magnesium: 2.3 mg/dL (ref 1.7–2.4)

## 2023-02-06 LAB — PROCALCITONIN: Procalcitonin: 0.23 ng/mL

## 2023-02-06 LAB — PHOSPHORUS: Phosphorus: 3.4 mg/dL (ref 2.5–4.6)

## 2023-02-06 MED ORDER — WARFARIN SODIUM 5 MG PO TABS
5.0000 mg | ORAL_TABLET | Freq: Once | ORAL | Status: AC
  Administered 2023-02-06: 5 mg via ORAL
  Filled 2023-02-06: qty 1

## 2023-02-06 MED ORDER — WARFARIN - PHARMACIST DOSING INPATIENT: Freq: Every day | Status: DC

## 2023-02-06 MED ORDER — POTASSIUM CHLORIDE CRYS ER 20 MEQ PO TBCR
40.0000 meq | EXTENDED_RELEASE_TABLET | Freq: Once | ORAL | Status: AC
  Administered 2023-02-06: 40 meq via ORAL
  Filled 2023-02-06: qty 2

## 2023-02-06 NOTE — Progress Notes (Signed)
PROGRESS NOTE        PATIENT DETAILS Name: Gary [name] Age: 78 y.o. Sex: male Date of Birth: 1945/10/29 Admit Date: 02/03/2023 Admitting Physician Leroy Sea, MD ZOX:[id number], [name], DO  Brief Summary: Patient is a 78 y.o.  male with history of granulomatosis polyangiitis-ILD-chronic hypoxic respiratory failure on 3-4 L of oxygen at home, CAD s/p PCI September 2023, A-fib on warfarin, chronic HFpEF-recent hospitalization for ILD flare-on tapering steroids-presented with shortness of breath-found to have acute hypoxic respiratory failure-requiring HFNC-secondary to ILD flare (while on prednisone 40 mg daily)   Significant events: 1/30>> admit to [facility]  Significant studies: 1/30>> CXR: Worsening right-sided infiltrates.  Significant microbiology data: 1/30>> respiratory virus panel: Negative 1/30>> COVID/influenza/RSV PCR: Negative  Procedures: None  Consults: PCCM  Subjective: Short of breath with minimal activity-down to 6 L of oxygen at rest.  Objective: Vitals: Blood pressure 115/63, pulse 91, temperature 98.1 F (36.7 C), temperature source Oral, resp. rate 18, height 5\' 10"  (1.778 m), weight 91.5 kg, SpO2 94%.   Exam: Gen Exam:Alert awake-not in any distress HEENT:atraumatic, normocephalic Chest: Few bibasilar rales. CVS:S1S2 regular Abdomen:soft non tender, non distended Extremities:no edema Neurology: Non focal Skin: no rash  Pertinent Labs/Radiology:    Latest Ref Rng & Units 02/06/2023    5:34 AM 02/05/2023    5:31 AM 02/04/2023    4:54 AM  CBC  WBC 4.0 - 10.5 K/uL 15.7  14.5  11.7   Hemoglobin 13.0 - 17.0 g/dL 8.8  8.6  9.1   Hematocrit 39.0 - 52.0 % 30.5  29.9  32.5   Platelets 150 - 400 K/uL 412  391  337     Lab Results  Component Value Date   NA 139 02/06/2023   K 3.9 02/06/2023   CL 104 02/06/2023   CO2 25 02/06/2023      Assessment/Plan: Acute on chronic hypoxic respiratory failure secondary to ILD  flare-history of granulomatosis with polyangiitis (on home O2-anywhere from 3-4 L) Slowly improving-Down to 6 L-however gets short of breath with minimal activity Remains euvolemic-no role for diuretics today On IV steroids PCCM following-await further recommendations.  History of granulomatosis with polyangiitis/Wegener's granulomatosis Was on tapering steroids (down to 40 mg)-when he developed worsening respiratory failure Prophylactic dapsone for PJP (no Bactrim as interaction with Tikosyn) PCCM has started CellCept-no longer on Rituxan Currently on IV Solu-Medrol CRP downtrending.  Paroxysmal atrial fibrillation Maintaining sinus rhythm Tikosyn/Coreg IV heparin-okay to start Coumadin Will discuss with TOC/pharmacy 2/3 regarding affordability of Eliquis-previously his cost was close to $600/month.  Chronic HFpEF Euvolemic  History of CAD s/p PCI September 2023 Aspirin Stop Plavix-this was also not on his most recent discharge list of medications.  Iron deficiency anemia Appreciate GI input-plans are for outpatient endoscopy evaluation once his respiratory status improves No overt bleeding noted-Hb relatively stable for the past several days  Hypothyroidism Synthroid  DM-2 CBG stable on SSI  Recent Labs    02/05/23 1645 02/05/23 2033 02/06/23 0852  GLUCAP 144* 206* 186*    Mood disorder Stable Continue Effexor   BMI: Estimated body mass index is 28.94 kg/m as calculated from the following:   Height as of this encounter: 5\' 10"  (1.778 m).   Weight as of this encounter: 91.5 kg.   Code status:   Code Status: Full Code   DVT Prophylaxis: SCDs Start: 02/03/23  1523   Family Communication: None at bedside   Disposition Plan: Status is: Inpatient Remains inpatient appropriate because: Severity of illness   Planned Discharge Destination:Home health   Diet: Diet Order             Diet regular Fluid consistency: Thin  Diet effective now                      Antimicrobial agents: Anti-infectives (From admission, onward)    Start     Dose/Rate Route Frequency Ordered Stop   02/03/23 1445  dapsone tablet 100 mg        100 mg Oral Daily 02/03/23 1442          MEDICATIONS: Scheduled Meds:  ALPRAZolam  0.5 mg Oral QHS   aspirin  81 mg Oral Daily   carvedilol  3.125 mg Oral BID WC   clopidogrel  75 mg Oral Daily   dapsone  100 mg Oral Daily   dofetilide  500 mcg Oral BID   feeding supplement  237 mL Oral BID BM   insulin aspart  0-5 Units Subcutaneous QHS   insulin aspart  0-9 Units Subcutaneous TID WC   levothyroxine  137 mcg Oral QAC breakfast   methylPREDNISolone (SOLU-MEDROL) injection  125 mg Intravenous Q12H   mycophenolate  500 mg Oral BID   pantoprazole  40 mg Oral BID   venlafaxine XR  150 mg Oral Daily   Continuous Infusions:  heparin 1,000 Units/hr (02/06/23 0408)   PRN Meds:.acetaminophen **OR** acetaminophen, bisacodyl, metoprolol tartrate, nitroGLYCERIN, promethazine   I have personally reviewed following labs and imaging studies  LABORATORY DATA: CBC: Recent Labs  Lab 02/03/23 1304 02/04/23 0454 02/05/23 0531 02/06/23 0534  WBC 13.8* 11.7* 14.5* 15.7*  NEUTROABS 11.1* 10.4* 12.8* 13.8*  HGB 8.7* 9.1* 8.6* 8.8*  HCT 30.1* 32.5* 29.9* 30.5*  MCV 86.0 87.4 85.4 86.4  PLT 341 337 391 412*    Basic Metabolic Panel: Recent Labs  Lab 02/03/23 1304 02/03/23 1443 02/04/23 0454 02/05/23 0531 02/06/23 0534  NA 140  --  138 140 139  K 3.9  --  4.0 3.9 3.9  CL 102  --  104 102 104  CO2 25  --  22 23 25   GLUCOSE 116*  --  160* 161* 150*  BUN 27*  --  23 28* 33*  CREATININE 1.04  --  0.89 1.13 1.03  CALCIUM 9.2  --  9.0 9.2 9.1  MG  --  2.3 2.2 2.4 2.3  PHOS  --   --  4.2 3.8 3.4    GFR: Estimated Creatinine Clearance: 66.1 mL/min (by C-G formula based on SCr of 1.03 mg/dL).  Liver Function Tests: Recent Labs  Lab 02/03/23 1304  AST 23  ALT 25  ALKPHOS 72  BILITOT 0.6  PROT  5.3*  ALBUMIN 3.0*   No results for input(s): "LIPASE", "AMYLASE" in the last 168 hours. No results for input(s): "AMMONIA" in the last 168 hours.  Coagulation Profile: Recent Labs  Lab 02/01/23 0000 02/03/23 1304 02/04/23 0454  INR 2.50* 2.7* 1.8*    Cardiac Enzymes: No results for input(s): "CKTOTAL", "CKMB", "CKMBINDEX", "TROPONINI" in the last 168 hours.  BNP (last 3 results) Recent Labs    11/04/22 1519  PROBNP 416.0*    Lipid Profile: No results for input(s): "CHOL", "HDL", "LDLCALC", "TRIG", "CHOLHDL", "LDLDIRECT" in the last 72 hours.  Thyroid Function Tests: No results for input(s): "TSH", "T4TOTAL", "FREET4", "T3FREE", "THYROIDAB" in  the last 72 hours.  Anemia Panel: Recent Labs    02/03/23 1443 02/03/23 1911  VITAMINB12  --  536  FOLATE 15.7  --   FERRITIN  --  470*  TIBC  --  412  IRON  --  162  RETICCTPCT  --  5.6*    Urine analysis:    Component Value Date/Time   COLORURINE YELLOW 09/17/2022 2236   APPEARANCEUR HAZY (A) 09/17/2022 2236   LABSPEC 1.010 09/17/2022 2236   PHURINE 6.5 09/17/2022 2236   GLUCOSEU NEGATIVE 09/17/2022 2236   HGBUR NEGATIVE 09/17/2022 2236   BILIRUBINUR NEGATIVE 09/17/2022 2236   KETONESUR NEGATIVE 09/17/2022 2236   PROTEINUR NEGATIVE 09/17/2022 2236   UROBILINOGEN 0.2 12/24/2011 0805   NITRITE NEGATIVE 09/17/2022 2236   LEUKOCYTESUR SMALL (A) 09/17/2022 2236    Sepsis Labs: Lactic Acid, Venous No results found for: "LATICACIDVEN"  MICROBIOLOGY: Recent Results (from the past 240 hours)  Resp panel by RT-PCR (RSV, Flu A&B, Covid) Anterior Nasal Swab     Status: None   Collection Time: 02/03/23 12:16 PM   Specimen: Anterior Nasal Swab  Result Value Ref Range Status   SARS Coronavirus 2 by RT PCR NEGATIVE NEGATIVE Final   Influenza A by PCR NEGATIVE NEGATIVE Final   Influenza B by PCR NEGATIVE NEGATIVE Final    Comment: (NOTE) The Xpert Xpress SARS-CoV-2/FLU/RSV plus assay is intended as an aid in the  diagnosis of influenza from Nasopharyngeal swab specimens and should not be used as a sole basis for treatment. Nasal washings and aspirates are unacceptable for Xpert Xpress SARS-CoV-2/FLU/RSV testing.  Fact Sheet for Patients: BloggerCourse.com  Fact Sheet for Healthcare Providers: SeriousBroker.it  This test is not yet approved or cleared by the [place] FDA and has been authorized for detection and/or diagnosis of SARS-CoV-2 by FDA under an Emergency Use Authorization (EUA). This EUA will remain in effect (meaning this test can be used) for the duration of the COVID-19 declaration under Section 564(b)(1) of the Act, 21 U.S.C. section 360bbb-3(b)(1), unless the authorization is terminated or revoked.     Resp Syncytial Virus by PCR NEGATIVE NEGATIVE Final    Comment: (NOTE) Fact Sheet for Patients: BloggerCourse.com  Fact Sheet for Healthcare Providers: SeriousBroker.it  This test is not yet approved or cleared by the [place] FDA and has been authorized for detection and/or diagnosis of SARS-CoV-2 by FDA under an Emergency Use Authorization (EUA). This EUA will remain in effect (meaning this test can be used) for the duration of the COVID-19 declaration under Section 564(b)(1) of the Act, 21 U.S.C. section 360bbb-3(b)(1), unless the authorization is terminated or revoked.  Performed at [facility], [address], [place], [place] [address]   MRSA Next Gen by PCR, Nasal     Status: None   Collection Time: 02/03/23  2:44 PM   Specimen: Nasal Mucosa; Nasal Swab  Result Value Ref Range Status   MRSA by PCR Next Gen NOT DETECTED NOT DETECTED Final    Comment: (NOTE) The GeneXpert MRSA Assay (FDA approved for NASAL specimens only), is one component of a comprehensive MRSA colonization surveillance program. It is not intended to diagnose MRSA infection nor to  guide or monitor treatment for MRSA infections. Test performance is not FDA approved in patients less than 60 years old. Performed at [facility], [address] [place], [place] [address]   Respiratory (~20 pathogens) panel by PCR     Status: None   Collection Time: 02/03/23  3:22 PM   Specimen: Nasopharyngeal Swab; Respiratory  Result Value Ref Range Status   Adenovirus NOT DETECTED NOT DETECTED Final   Coronavirus 229E NOT DETECTED NOT DETECTED Final    Comment: (NOTE) The Coronavirus on the Respiratory Panel, DOES NOT test for the novel  Coronavirus (2019 nCoV)    Coronavirus HKU1 NOT DETECTED NOT DETECTED Final   Coronavirus NL63 NOT DETECTED NOT DETECTED Final   Coronavirus OC43 NOT DETECTED NOT DETECTED Final   Metapneumovirus NOT DETECTED NOT DETECTED Final   Rhinovirus / Enterovirus NOT DETECTED NOT DETECTED Final   Influenza A NOT DETECTED NOT DETECTED Final   Influenza B NOT DETECTED NOT DETECTED Final   Parainfluenza Virus 1 NOT DETECTED NOT DETECTED Final   Parainfluenza Virus 2 NOT DETECTED NOT DETECTED Final   Parainfluenza Virus 3 NOT DETECTED NOT DETECTED Final   Parainfluenza Virus 4 NOT DETECTED NOT DETECTED Final   Respiratory Syncytial Virus NOT DETECTED NOT DETECTED Final   Bordetella pertussis NOT DETECTED NOT DETECTED Final   Bordetella Parapertussis NOT DETECTED NOT DETECTED Final   Chlamydophila pneumoniae NOT DETECTED NOT DETECTED Final   Mycoplasma pneumoniae NOT DETECTED NOT DETECTED Final    Comment: Performed at [facility], [address], [place], [place] [address]    RADIOLOGY STUDIES/RESULTS: No results found.   LOS: 3 days   Jeoffrey Massed, MD  [facility]    To contact the attending provider between 7A-7P or the covering provider during after hours 7P-7A, please log into the web site [email or web address] and access using universal [place] password for that web site. If you do not have the password, please call the  hospital operator.  02/06/2023, 9:41 AM

## 2023-02-06 NOTE — Plan of Care (Signed)

## 2023-02-06 NOTE — Progress Notes (Addendum)
PHARMACY - ANTICOAGULATION CONSULT NOTE  Pharmacy Consult for Heparin Indication: atrial fibrillation  Allergies  Allergen Reactions   Farxiga [Dapagliflozin] Other (See Comments)    Lightheadedness Low blood pressure   Statins Other (See Comments)    Myalgias     Patient Measurements: Height: 5\' 10"  (177.8 cm) Weight: 91.5 kg (201 lb 11.5 oz) IBW/kg (Calculated) : 73 Heparin Dosing Weight: 90.3kg  Vital Signs: Temp: 97.6 F (36.4 C) (02/02 0405) Temp Source: Oral (02/02 0405) BP: 139/73 (02/02 0405) Pulse Rate: 74 (02/02 0405)  Labs: Recent Labs    02/03/23 1304 02/03/23 1443 02/03/23 1911 02/03/23 2341 02/04/23 0454 02/04/23 1959 02/05/23 0531 02/05/23 1304 02/05/23 2305 02/06/23 0534  HGB 8.7*  --   --   --  9.1*  --  8.6*  --   --  8.8*  HCT 30.1*  --   --   --  32.5*  --  29.9*  --   --  30.5*  PLT 341  --   --   --  337  --  391  --   --  412*  LABPROT 29.2*  --   --   --  20.9*  --   --   --   --   --   INR 2.7*  --   --   --  1.8*  --   --   --   --   --   HEPARINUNFRC  --   --   --   --   --    < > 0.92* 0.94* 0.58 0.57  CREATININE 1.04  --   --   --  0.89  --  1.13  --   --  1.03  TROPONINIHS 31* 31* 37* 26*  --   --   --   --   --   --    < > = values in this interval not displayed.    Estimated Creatinine Clearance: 66.1 mL/min (by C-G formula based on SCr of 1.03 mg/dL).   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    Medications:   Scheduled:   ALPRAZolam  0.5 mg Oral QHS   aspirin  81 mg Oral Daily   carvedilol  3.125 mg Oral BID WC   clopidogrel  75 mg Oral Daily   dapsone  100 mg Oral Daily   dofetilide  500 mcg Oral BID   feeding supplement  237 mL Oral BID BM   insulin aspart  0-5 Units Subcutaneous QHS   insulin aspart  0-9 Units Subcutaneous TID WC   levothyroxine  137 mcg Oral QAC breakfast   methylPREDNISolone (SOLU-MEDROL) injection  125 mg Intravenous Q12H   mycophenolate  500 mg Oral BID   pantoprazole  40 mg Oral BID   venlafaxine XR  150 mg Oral Daily    Assessment: 77YOM with history of atrial fibrillation on warfarin who presents with SOB. PTA warfarin  dosing: 5mg  daily, except 2.5mg  MWF. Last warfarin dose was 1/29 (PTA). INR on admit was therapeutic at 2.7. Patient has been on heparin drip per pharmacy and now is being restarted on PTA warfarin per pharmacy. Will continue heparin gtt as bridge.  Heparin level therapeutic (0.57) on 1000 units/hr INR 1.2, subtherapeutic. No issues with heparin infusion or s/sx of bleeding per RN. CBC stable.  Goal of Therapy:  INR 2-3 Heparin level 0.3-0.7 units/ml Monitor platelets by anticoagulation protocol: Yes   Plan:  Continue heparin gtt at 1000 units/hr Warfarin 5mg  PO x1 Heparin level, INR, and CBC daily Monitor s/sx of bleeding  Nicole Kindred, PharmD PGY1 Pharmacy Resident 02/06/2023 8:25 AM

## 2023-02-06 NOTE — Progress Notes (Signed)
NAME:  Gary [name], MRN:  [id number], DOB:  03-13-1945, LOS: 3 ADMISSION DATE:  02/03/2023, CONSULTATION DATE:  02/03/2023 REFERRING MD:  Dr. Susa [name], CHIEF COMPLAINT:  dyspnea, hypoxia   History of Present Illness:  This 78 year old Caucasian male is seen in consultation at the request of Dr. Susa [name] for recommendations on further evaluation and management of hypoxia.  The patient was sent to [facility] emergency department from [facility] pulmonary clinic today, after he presented with reports of having profound hypoxia at home (down to SpO2 45%).  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  He is on rituximab and steroids.  In fact, he was recently hospitalized for interstitial lung disease flareup and has been on a prednisone taper, currently on prednisone 40 mg p.o. daily.  Treated with high-dose steroids.  Right heart cath on 01/04/2023 showed normal filling pressures, mild pulmonary hypertension and preserved cardiac output.  He reported doing well after his recent hospitalization and felt that he had a very good day yesterday.  However, this morning he woke up and experienced extreme exertional dyspnea while getting from his bed to the bathroom, when he noted that his SpO2 went down to 45%.  He is normally on supplemental oxygen at 2-2.5 LPM via nasal cannula.  He does titrate as needed to maintain SpO2 90%.  However, he noted that despite turning his supplemental oxygen up to 6 LPM (which he typically needs during ambulation), he was not recovering.  He denies cough.  He denies pleuritic pain.  He denies sputum production.  He denies chest pain.  He does state that when he was profoundly hypoxic he did notice that he was experiencing blurring of vision.  He has noted some modest increased swelling in his lower extremities.  He does confess that he is prescribed daily furosemide but is inconsistent with adherence to  prescribed therapy.  Patient says he has had a very stressful and very active week, which revolves around having to get to multiple medical appointments.  He had an iron infusion a few days ago.  He also had a Rituxan infusion earlier this week.  Patient says he has been having blood in his stools, although he says that this is not any more than usual.  This has been a chronic problem ever since his diagnosis of Wegener's granulomatosis.    He is on Coumadin for chronic atrial fibrillation.  His last INR check (01/24/2023) was supratherapeutic, 6.7.  However, upon recheck in the emergency department, this has fallen into the therapeutic range, 2.7.  Last hemoglobin reported by patient was 8.2, currently 8.7.   Pertinent  Medical History  PAST MEDICAL history significant for Wegener's granulomatosis (diagnosed 2019, on Rituxan and steroids), type 2 diabetes mellitus, OSA on CPAP, atrial fibrillation on coumadin, HFpEF (LVEF 65 in 12/28/2022, grade 1 diastolic dysfunction), pulmonary hypertension, coronary artery disease.  Significant Hospital Events: Including procedures, antibiotic start and stop dates in addition to other pertinent events   1/30 presented with shortness of breath and hypoxia to steroids started 1/31 states he feels well but is requiring 8 to 10 L high flow nasal cannula 2/2 no acute issues overnight, remains on 6 L high flow  Interim History / Subjective:  Seen sitting up on side of bed, slightly hypoxic after brushing teeth  Objective   Blood pressure (!) 105/53, pulse 91, temperature 98 F (36.7 C), temperature source Oral, resp. rate 18, height 5\' 10"  (  1.778 m), weight 91.5 kg, SpO2 94%.        Intake/Output Summary (Last 24 hours) at 02/06/2023 1051 Last data filed at 02/06/2023 0451 Gross per 24 hour  Intake 240.28 ml  Output 1175 ml  Net -934.72 ml   Filed Weights   02/03/23 1227 02/04/23 1906  Weight: 94.8 kg 91.5 kg    Examination: General: Acute on chronic  ill-appearing elderly male sitting up on edge of bed in no acute distress HEENT: [place]/AT, MM pink/moist, PERRL,  Neuro: Alert and oriented x 3, nonfocal CV: s1s2 regular rate and rhythm, no murmur, rubs, or gallops,  PULM: Slightly tachypneic after attempting to brush teeth, on 6 L nasal cannula, no added breath sounds GI: soft, bowel sounds active in all 4 quadrants, non-tender, non-distended, tolerating  Extremities: warm/dry, no edema  Skin: no rashes or lesions   Resolved Hospital Problem list   Not applicable.  Assessment & Plan:  GPA in flare Hx afib on AC/tikosyn -Discussed with Dr. Marchelle [name]; start cellcept and likely hold off on further rituxan in future P: Continue high-dose steroids Wean supplemental oxygen as able, SpO2 goal greater than 88 Continue CellCept Aspiration precautions Lines as able Patient would benefit from pulmonary rehab consult upon discharge  Gary [name] D. [name], NP-C [facility] Personal contact information can be found on Amion  If no contact or response made please call [phone] 02/06/2023, 10:53 AM

## 2023-02-07 ENCOUNTER — Telehealth (HOSPITAL_COMMUNITY): Payer: Self-pay | Admitting: Pharmacy Technician

## 2023-02-07 ENCOUNTER — Other Ambulatory Visit (HOSPITAL_COMMUNITY): Payer: Self-pay

## 2023-02-07 DIAGNOSIS — J849 Interstitial pulmonary disease, unspecified: Secondary | ICD-10-CM | POA: Diagnosis not present

## 2023-02-07 DIAGNOSIS — I5032 Chronic diastolic (congestive) heart failure: Secondary | ICD-10-CM | POA: Diagnosis not present

## 2023-02-07 DIAGNOSIS — J9621 Acute and chronic respiratory failure with hypoxia: Secondary | ICD-10-CM | POA: Diagnosis not present

## 2023-02-07 LAB — CBC WITH DIFFERENTIAL/PLATELET
Abs Immature Granulocytes: 0.45 10*3/uL — ABNORMAL HIGH (ref 0.00–0.07)
Basophils Absolute: 0 10*3/uL (ref 0.0–0.1)
Basophils Relative: 0 %
Eosinophils Absolute: 0 10*3/uL (ref 0.0–0.5)
Eosinophils Relative: 0 %
HCT: 29.4 % — ABNORMAL LOW (ref 39.0–52.0)
Hemoglobin: 8.4 g/dL — ABNORMAL LOW (ref 13.0–17.0)
Immature Granulocytes: 4 %
Lymphocytes Relative: 6 %
Lymphs Abs: 0.7 10*3/uL (ref 0.7–4.0)
MCH: 24.9 pg — ABNORMAL LOW (ref 26.0–34.0)
MCHC: 28.6 g/dL — ABNORMAL LOW (ref 30.0–36.0)
MCV: 87.2 fL (ref 80.0–100.0)
Monocytes Absolute: 0.6 10*3/uL (ref 0.1–1.0)
Monocytes Relative: 5 %
Neutro Abs: 10.7 10*3/uL — ABNORMAL HIGH (ref 1.7–7.7)
Neutrophils Relative %: 85 %
Platelets: 376 10*3/uL (ref 150–400)
RBC: 3.37 MIL/uL — ABNORMAL LOW (ref 4.22–5.81)
RDW: 24.2 % — ABNORMAL HIGH (ref 11.5–15.5)
WBC: 12.6 10*3/uL — ABNORMAL HIGH (ref 4.0–10.5)
nRBC: 1.2 % — ABNORMAL HIGH (ref 0.0–0.2)

## 2023-02-07 LAB — GLUCOSE, CAPILLARY
Glucose-Capillary: 120 mg/dL — ABNORMAL HIGH (ref 70–99)
Glucose-Capillary: 174 mg/dL — ABNORMAL HIGH (ref 70–99)
Glucose-Capillary: 126 mg/dL — ABNORMAL HIGH (ref 70–99)
Glucose-Capillary: 237 mg/dL — ABNORMAL HIGH (ref 70–99)

## 2023-02-07 LAB — BASIC METABOLIC PANEL
Anion gap: 10 (ref 5–15)
BUN: 28 mg/dL — ABNORMAL HIGH (ref 8–23)
CO2: 25 mmol/L (ref 22–32)
Calcium: 9.3 mg/dL (ref 8.9–10.3)
Chloride: 107 mmol/L (ref 98–111)
Creatinine, Ser: 1.01 mg/dL (ref 0.61–1.24)
GFR, Estimated: >60 mL/min (ref >60)
Glucose, Bld: 152 mg/dL — ABNORMAL HIGH (ref 70–99)
Potassium: 4.4 mmol/L (ref 3.5–5.1)
Sodium: 142 mmol/L (ref 135–145)

## 2023-02-07 LAB — PHOSPHORUS: Phosphorus: 3.6 mg/dL (ref 2.5–4.6)

## 2023-02-07 LAB — PROCALCITONIN: Procalcitonin: 0.19 ng/mL

## 2023-02-07 LAB — C-REACTIVE PROTEIN: CRP: 3.5 mg/dL — ABNORMAL HIGH

## 2023-02-07 LAB — PROTIME-INR
INR: 1.2 (ref 0.8–1.2)
Prothrombin Time: 15.6 s — ABNORMAL HIGH (ref 11.4–15.2)

## 2023-02-07 LAB — HEPARIN LEVEL (UNFRACTIONATED): Heparin Unfractionated: 0.6 [IU]/mL (ref 0.30–0.70)

## 2023-02-07 LAB — MAGNESIUM: Magnesium: 2.3 mg/dL (ref 1.7–2.4)

## 2023-02-07 LAB — BRAIN NATRIURETIC PEPTIDE: B Natriuretic Peptide: 327 pg/mL — ABNORMAL HIGH (ref 0.0–100.0)

## 2023-02-07 MED ORDER — APIXABAN 5 MG PO TABS
5.0000 mg | ORAL_TABLET | Freq: Two times a day (BID) | ORAL | Status: AC
  Administered 2023-02-07 – 2023-02-20 (×27): 5 mg via ORAL
  Filled 2023-02-07 (×27): qty 1

## 2023-02-07 NOTE — Telephone Encounter (Signed)
Patient Product/process development scientist completed.    The patient is insured through [employer]. Patient has Medicare and is not eligible for a copay card, but may be able to apply for patient assistance or Medicare RX Payment Plan (Patient Must reach out to their plan, if eligible for payment plan), if available.    Ran test claim for Eliquis 5 mg and the current 30 day co-pay is $125.09 due to a deductible.   This test claim was processed through [facility]- copay amounts may vary at other pharmacies due to pharmacy/plan contracts, or as the patient moves through the different stages of their insurance plan.     Roland Earl, CPHT Pharmacy Technician III Certified Patient Advocate [facility] Pharmacy Patient Advocate Team Direct Number: [phone]  Fax: [phone]

## 2023-02-07 NOTE — Progress Notes (Signed)
PHARMACY - ANTICOAGULATION CONSULT NOTE  Pharmacy Consult for Heparin/Warfarin > Apixaban Indication: atrial fibrillation  Allergies  Allergen Reactions   Farxiga [Dapagliflozin] Other (See Comments)    Lightheadedness Low blood pressure   Statins Other (See Comments)    Myalgias     Patient Measurements: Height: 5\' 10"  (177.8 cm) Weight: 91.5 kg (201 lb 11.5 oz) IBW/kg (Calculated) : 73 Heparin Dosing Weight: 91.5 kg  Vital Signs: Temp: 98.1 F (36.7 C) (02/03 0400) Temp Source: Oral (02/03 0400) BP: 122/70 (02/03 0400) Pulse Rate: 70 (02/03 0400)  Labs: Recent Labs    02/05/23 0531 02/05/23 1304 02/05/23 2305 02/06/23 0534 02/06/23 1033 02/07/23 0509  HGB 8.6*  --   --  8.8*  --  8.4*  HCT 29.9*  --   --  30.5*  --  29.4*  PLT 391  --   --  412*  --  376  LABPROT  --   --   --   --  15.4* 15.6*  INR  --   --   --   --  1.2 1.2  HEPARINUNFRC 0.92*   < > 0.58 0.57  --  0.60  CREATININE 1.13  --   --  1.03  --  1.01   < > = values in this interval not displayed.    Estimated Creatinine Clearance: 67.4 mL/min (by C-G formula based on SCr of 1.01 mg/dL).  Assessment: 77YOM with history of atrial fibrillation on warfarin who presented with SOB on 02/03/23 Was on warfarin PTA, held on admit and IV heparin begun 1/31. Warfarin resumed 2/2 with 5 mg and INR unchanged at 1.2 today. Now to transition to Apixaban, as updated price check is more affordable.  Goal of Therapy:  Appropriate Apixaban regimen for indication Monitor platelets by anticoagulation protocol: Yes   Plan:  Apixaban 5 mg PO BID. IV heparin to stop when giving first dose of Apixaban. Monitor CBC.  Dennie Fetters, RPh 02/07/2023,11:32 AM

## 2023-02-07 NOTE — Progress Notes (Signed)
Physical Therapy Treatment Patient Details Name: Gary [name] MRN: [id number] DOB: [birth year] Today's Date: 02/07/2023   History of Present Illness Pt is 78 yo male admitted on 1/30/425 with acute on chronic resp failure with hypoxia.  Pt with hx including but not limited to Wegener's granulomatosis, ILD, former smoker, CAD/CABG, afib, CHF, DM2    PT Comments  Pt received in supine and agreeable to session. Pt reports increased fatigue from earlier mobility with OT and nursing, however pt motivated to participate in PT session. Pt demonstrates bed mobility and transfers without physical assist, however continues to be limited by DOE and low SpO2, as low as 83%, with mobility on 8L. Pt able to perform standing marches for ~10 seconds before requiring a seated rest break. Pt able to tolerate additional seated BLE exercises with encouragement to perform throughout the day independently. Pt continues to benefit from PT services to progress toward functional mobility goals.    If plan is discharge home, recommend the following: Assistance with cooking/housework;Assist for transportation;Help with stairs or ramp for entrance   Can travel by private vehicle        Equipment Recommendations  None recommended by PT    Recommendations for Other Services       Precautions / Restrictions Precautions Precautions: Other (comment) Precaution Comments: watch sats Restrictions Weight Bearing Restrictions Per Provider Order: No     Mobility  Bed Mobility Overal bed mobility: Modified Independent                  Transfers Overall transfer level: Needs assistance Equipment used: Rolling walker (2 wheels) Transfers: Sit to/from Stand Sit to Stand: Supervision           General transfer comment: from EOB with cues for safe hand placement    Ambulation/Gait             Pre-gait activities: static standing marches General Gait Details: Pt declines due to increased  fatigue      Balance Overall balance assessment: Mild deficits observed, not formally tested                                          Cognition Arousal: Alert Behavior During Therapy: WFL for tasks assessed/performed Overall Cognitive Status: Within Functional Limits for tasks assessed                                          Exercises General Exercises - Lower Extremity Ankle Circles/Pumps: AROM, Seated, Both, 10 reps Long Arc Quad: AROM, Seated, Both, 10 reps Hip Flexion/Marching: AROM, Seated, Both, 10 reps Other Exercises Other Exercises: BLE static standing marches for ~10 sec    General Comments General comments (skin integrity, edema, etc.): Pt on 8L throughout session with SpO2 dropping as low as 83% during mobility, but improving to >88% with seated rest and cues for pursed lip breathing      Pertinent Vitals/Pain Pain Assessment Pain Assessment: No/denies pain     PT Goals (current goals can now be found in the care plan section) Acute Rehab PT Goals Patient Stated Goal: home PT Goal Formulation: With patient Time For Goal Achievement: 02/18/23 Progress towards PT goals: Progressing toward goals    Frequency    Min 1X/week  AM-PAC PT "6 Clicks" Mobility   Outcome Measure  Help needed turning from your back to your side while in a flat bed without using bedrails?: None Help needed moving from lying on your back to sitting on the side of a flat bed without using bedrails?: None Help needed moving to and from a bed to a chair (including a wheelchair)?: A Little Help needed standing up from a chair using your arms (e.g., wheelchair or bedside chair)?: A Little Help needed to walk in hospital room?: A Little Help needed climbing 3-5 steps with a railing? : A Little 6 Click Score: 20    End of Session Equipment Utilized During Treatment: Oxygen Activity Tolerance: Patient limited by fatigue;Treatment limited  secondary to medical complications (Comment) (desat) Patient left: in bed;with call bell/phone within reach Nurse Communication: Mobility status PT Visit Diagnosis: Difficulty in walking, not elsewhere classified (R26.2)     Time: 7829-5621 PT Time Calculation (min) (ACUTE ONLY): 22 min  Charges:    $Therapeutic Exercise: 8-22 mins PT General Charges $$ ACUTE PT VISIT: 1 Visit                     Johny Shock, PTA Acute Rehabilitation Services Secure Chat Preferred  [phone]    Johny Shock 02/07/2023, 1:08 PM

## 2023-02-07 NOTE — Progress Notes (Signed)
NAME:  Gary [name], MRN:  [id number], DOB:  1945-08-26, LOS: 4 ADMISSION DATE:  02/03/2023, CONSULTATION DATE:  02/03/2023 REFERRING MD:  Dr. Susa Raring, CHIEF COMPLAINT:  dyspnea, hypoxia   History of Present Illness:  This 78 year old Caucasian male is seen in consultation at the request of Dr. Susa Raring for recommendations on further evaluation and management of hypoxia.  The patient was sent to [facility] emergency department from [facility] pulmonary clinic today, after he presented with reports of having profound hypoxia at home (down to SpO2 45%).  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  He is on rituximab and steroids.  In fact, he was recently hospitalized for interstitial lung disease flareup and has been on a prednisone taper, currently on prednisone 40 mg p.o. daily.  Treated with high-dose steroids.  Right heart cath on 01/04/2023 showed normal filling pressures, mild pulmonary hypertension and preserved cardiac output.  He reported doing well after his recent hospitalization and felt that he had a very good day yesterday.  However, this morning he woke up and experienced extreme exertional dyspnea while getting from his bed to the bathroom, when he noted that his SpO2 went down to 45%.  He is normally on supplemental oxygen at 2-2.5 LPM via nasal cannula.  He does titrate as needed to maintain SpO2 90%.  However, he noted that despite turning his supplemental oxygen up to 6 LPM (which he typically needs during ambulation), he was not recovering.  He denies cough.  He denies pleuritic pain.  He denies sputum production.  He denies chest pain.  He does state that when he was profoundly hypoxic he did notice that he was experiencing blurring of vision.  He has noted some modest increased swelling in his lower extremities.  He does confess that he is prescribed daily furosemide but is inconsistent with adherence to  prescribed therapy.  Patient says he has had a very stressful and very active week, which revolves around having to get to multiple medical appointments.  He had an iron infusion a few days ago.  He also had a Rituxan infusion earlier this week.  Patient says he has been having blood in his stools, although he says that this is not any more than usual.  This has been a chronic problem ever since his diagnosis of Wegener's granulomatosis.    He is on Coumadin for chronic atrial fibrillation.  His last INR check (01/24/2023) was supratherapeutic, 6.7.  However, upon recheck in the emergency department, this has fallen into the therapeutic range, 2.7.  Last hemoglobin reported by patient was 8.2, currently 8.7.   Pertinent  Medical History  PAST MEDICAL history significant for Wegener's granulomatosis (diagnosed 2019, on Rituxan and steroids), type 2 diabetes mellitus, OSA on CPAP, atrial fibrillation on coumadin, HFpEF (LVEF 65 in 12/28/2022, grade 1 diastolic dysfunction), pulmonary hypertension, coronary artery disease.  Significant Hospital Events: Including procedures, antibiotic start and stop dates in addition to other pertinent events   1/30 presented with shortness of breath and hypoxia to steroids started 1/31 states he feels well but is requiring 8 to 10 L high flow nasal cannula 2/2 no acute issues overnight, remains on 6 L high flow  Interim History / Subjective:  Sitting on side of bed, wanting to get OOB to bsc. Reports still feeling easily more SOB than baseline with minimal activity. O2 needs improving slowly. On 5L [place] (baseline 2-3L). Denies overt SOB at rest.  Objective   Blood pressure 122/70, pulse 70, temperature 98.1 F (36.7 C), temperature source Oral, resp. rate 20, height 5\' 10"  (1.778 m), weight 91.5 kg, SpO2 90%.    FiO2 (%):  [40 %] 40 %   Intake/Output Summary (Last 24 hours) at 02/07/2023 1016 Last data filed at 02/06/2023 1800 Gross per 24 hour  Intake --  Output  400 ml  Net -400 ml   Filed Weights   02/03/23 1227 02/04/23 1906  Weight: 94.8 kg 91.5 kg    Examination: General: Acute on chronic ill-appearing elderly male sitting up on edge of bed in no acute distress HEENT: [place]/AT, MM pink/moist, PERRL,  Neuro: Alert and oriented x 3, nonfocal CV: s1s2 regular rate and rhythm, no murmur, rubs, or gallops,  PULM: resps even non labored on [place], few scattered crackles otherwise clear  GI: soft, bowel sounds active in all 4 quadrants, non-tender, non-distended, tolerating  Extremities: warm/dry, no edema  Skin: no rashes or lesions   Resolved Hospital Problem list   Not applicable.  Assessment & Plan:  GPA in flare Hx afib on AC/tikosyn -Discussed with [name]; start cellcept and likely hold off on further rituxan in future P: Continue high-dose steroids for now, will wean with taper plan outlined closer to d/c Wean supplemental oxygen as able, SpO2 goal greater than 88 - goal closer to 2-3L at rest  Continue CellCept, steroids, dapsone  Aspiration precautions Mobilize as able  Patient would benefit from pulmonary rehab consult upon discharge  Dirk Dress, NP Pulmonary/Critical Care Medicine  02/07/2023  10:16 AM   See Amion for personal pager PCCM on call pager [phone] until 7pm. Please call Elink 7p-7a. [phone]

## 2023-02-07 NOTE — Plan of Care (Signed)
   Problem: Education: Goal: Ability to describe self-care measures that may prevent or decrease complications (Diabetes Survival Skills Education) will improve Outcome: Progressing Goal: Individualized Educational Video(s) Outcome: Progressing   Problem: Coping: Goal: Ability to adjust to condition or change in health will improve Outcome: Progressing   Problem: Fluid Volume: Goal: Ability to maintain a balanced intake and output will improve Outcome: Progressing   Problem: Health Behavior/Discharge Planning: Goal: Ability to identify and utilize available resources and services will improve Outcome: Progressing Goal: Ability to manage health-related needs will improve Outcome: Progressing   Problem: Skin Integrity: Goal: Risk for impaired skin integrity will decrease Outcome: Progressing   Problem: Tissue Perfusion: Goal: Adequacy of tissue perfusion will improve Outcome: Progressing

## 2023-02-07 NOTE — Progress Notes (Signed)
PROGRESS NOTE        PATIENT DETAILS Name: Gary [name] Age: 78 y.o. Sex: male Date of Birth: May 19, 1945 Admit Date: 02/03/2023 Admitting Physician Leroy Sea, MD ZOX:WRUEAV, [name], DO  Brief Summary: Patient is a 78 y.o.  male with history of granulomatosis polyangiitis-ILD-chronic hypoxic respiratory failure on 3-4 L of oxygen at home, CAD s/p PCI September 2023, A-fib on warfarin, chronic HFpEF-recent hospitalization for ILD flare-on tapering steroids-presented with shortness of breath-found to have acute hypoxic respiratory failure-requiring HFNC-secondary to ILD flare (while on prednisone 40 mg daily)   Significant events: 1/30>> admit to [facility]  Significant studies: 1/30>> CXR: Worsening right-sided infiltrates.  Significant microbiology data: 1/30>> respiratory virus panel: Negative 1/30>> COVID/influenza/RSV PCR: Negative  Procedures: None  Consults: PCCM  Subjective: Unchanged overnight-still gets short of breath with minimal activity-on around 5-6 L of oxygen this morning.  Objective: Vitals: Blood pressure 122/70, pulse 70, temperature 98.1 F (36.7 C), temperature source Oral, resp. rate 20, height 5\' 10"  (1.778 m), weight 91.5 kg, SpO2 90%.   Exam: Gen Exam:Alert awake-not in any distress HEENT:atraumatic, normocephalic Chest: Moving air well-continues to have bibasilar rales mostly. CVS:S1S2 regular Abdomen:soft non tender, non distended Extremities:no edema Neurology: Non focal Skin: no rash  Pertinent Labs/Radiology:    Latest Ref Rng & Units 02/07/2023    5:09 AM 02/06/2023    5:34 AM 02/05/2023    5:31 AM  CBC  WBC 4.0 - 10.5 K/uL 12.6  15.7  14.5   Hemoglobin 13.0 - 17.0 g/dL 8.4  8.8  8.6   Hematocrit 39.0 - 52.0 % 29.4  30.5  29.9   Platelets 150 - 400 K/uL 376  412  391     Lab Results  Component Value Date   NA 142 02/07/2023   K 4.4 02/07/2023   CL 107 02/07/2023   CO2 25 02/07/2023       Assessment/Plan: Acute on chronic hypoxic respiratory failure secondary to ILD flare-history of granulomatosis with polyangiitis (on home O2-anywhere from 3-4 L) Slowly improving-requiring around 5-6 L of oxygen at rest-gets short of breath with minimal activity.  Remains euvolemic-no role for diuretics today On IV steroids/CellCept PCCM following-await further recommendations.  History of granulomatosis with polyangiitis/Wegener's granulomatosis Was on tapering steroids (down to 40 mg)-when he developed worsening respiratory failure Prophylactic dapsone for PJP (no Bactrim as interaction with Tikosyn) PCCM has started CellCept-no longer on Rituxan Currently on IV Solu-Medrol CRP downtrending.  Paroxysmal atrial fibrillation Maintaining sinus rhythm Tikosyn/Coreg Was not IV heparin-plan was to restart Coumadin-however his Eliquis was priced out by the patient advocate on 2/2-he is okay with now starting Eliquis as it is more affordable.  Heparin/Coumadin.  Chronic HFpEF Euvolemic  History of CAD s/p PCI September 2023 Aspirin No longer on Plavix-this was also not on his most recent discharge list of medications.  Iron deficiency anemia Appreciate GI input-plans are for outpatient endoscopy evaluation once his respiratory status improves No overt bleeding noted-Hb relatively stable for the past several days  Hypothyroidism Synthroid  DM-2 CBG stable on SSI  Recent Labs    02/06/23 1659 02/06/23 2021 02/07/23 0738  GLUCAP 150* 186* 120*    Mood disorder Stable Continue Effexor   BMI: Estimated body mass index is 28.94 kg/m as calculated from the following:   Height as of this encounter: 5\' 10"  (1.778 m).  Weight as of this encounter: 91.5 kg.   Code status:   Code Status: Full Code   DVT Prophylaxis: SCDs Start: 02/03/23 1523   Family Communication: None at bedside   Disposition Plan: Status is: Inpatient Remains inpatient appropriate because:  Severity of illness   Planned Discharge Destination:Home health   Diet: Diet Order             Diet regular Fluid consistency: Thin  Diet effective now                     Antimicrobial agents: Anti-infectives (From admission, onward)    Start     Dose/Rate Route Frequency Ordered Stop   02/03/23 1445  dapsone tablet 100 mg        100 mg Oral Daily 02/03/23 1442          MEDICATIONS: Scheduled Meds:  ALPRAZolam  0.5 mg Oral QHS   aspirin  81 mg Oral Daily   carvedilol  3.125 mg Oral BID WC   dapsone  100 mg Oral Daily   dofetilide  500 mcg Oral BID   feeding supplement  237 mL Oral BID BM   insulin aspart  0-5 Units Subcutaneous QHS   insulin aspart  0-9 Units Subcutaneous TID WC   levothyroxine  137 mcg Oral QAC breakfast   methylPREDNISolone (SOLU-MEDROL) injection  125 mg Intravenous Q12H   mycophenolate  500 mg Oral BID   pantoprazole  40 mg Oral BID   venlafaxine XR  150 mg Oral Daily   Continuous Infusions:  heparin 1,000 Units/hr (02/06/23 0408)   PRN Meds:.acetaminophen **OR** acetaminophen, bisacodyl, metoprolol tartrate, nitroGLYCERIN, promethazine   I have personally reviewed following labs and imaging studies  LABORATORY DATA: CBC: Recent Labs  Lab 02/03/23 1304 02/04/23 0454 02/05/23 0531 02/06/23 0534 02/07/23 0509  WBC 13.8* 11.7* 14.5* 15.7* 12.6*  NEUTROABS 11.1* 10.4* 12.8* 13.8* 10.7*  HGB 8.7* 9.1* 8.6* 8.8* 8.4*  HCT 30.1* 32.5* 29.9* 30.5* 29.4*  MCV 86.0 87.4 85.4 86.4 87.2  PLT 341 337 391 412* 376    Basic Metabolic Panel: Recent Labs  Lab 02/03/23 1304 02/03/23 1443 02/04/23 0454 02/05/23 0531 02/06/23 0534 02/07/23 0509  NA 140  --  138 140 139 142  K 3.9  --  4.0 3.9 3.9 4.4  CL 102  --  104 102 104 107  CO2 25  --  22 23 25 25   GLUCOSE 116*  --  160* 161* 150* 152*  BUN 27*  --  23 28* 33* 28*  CREATININE 1.04  --  0.89 1.13 1.03 1.01  CALCIUM 9.2  --  9.0 9.2 9.1 9.3  MG  --  2.3 2.2 2.4 2.3 2.3   PHOS  --   --  4.2 3.8 3.4 3.6    GFR: Estimated Creatinine Clearance: 67.4 mL/min (by C-G formula based on SCr of 1.01 mg/dL).  Liver Function Tests: Recent Labs  Lab 02/03/23 1304  AST 23  ALT 25  ALKPHOS 72  BILITOT 0.6  PROT 5.3*  ALBUMIN 3.0*   No results for input(s): "LIPASE", "AMYLASE" in the last 168 hours. No results for input(s): "AMMONIA" in the last 168 hours.  Coagulation Profile: Recent Labs  Lab 02/01/23 0000 02/03/23 1304 02/04/23 0454 02/06/23 1033 02/07/23 0509  INR 2.50* 2.7* 1.8* 1.2 1.2    Cardiac Enzymes: No results for input(s): "CKTOTAL", "CKMB", "CKMBINDEX", "TROPONINI" in the last 168 hours.  BNP (last 3 results) Recent Labs  11/04/22 1519  PROBNP 416.0*    Lipid Profile: No results for input(s): "CHOL", "HDL", "LDLCALC", "TRIG", "CHOLHDL", "LDLDIRECT" in the last 72 hours.  Thyroid Function Tests: No results for input(s): "TSH", "T4TOTAL", "FREET4", "T3FREE", "THYROIDAB" in the last 72 hours.  Anemia Panel: No results for input(s): "VITAMINB12", "FOLATE", "FERRITIN", "TIBC", "IRON", "RETICCTPCT" in the last 72 hours.   Urine analysis:    Component Value Date/Time   COLORURINE YELLOW 09/17/2022 2236   APPEARANCEUR HAZY (A) 09/17/2022 2236   LABSPEC 1.010 09/17/2022 2236   PHURINE 6.5 09/17/2022 2236   GLUCOSEU NEGATIVE 09/17/2022 2236   HGBUR NEGATIVE 09/17/2022 2236   BILIRUBINUR NEGATIVE 09/17/2022 2236   KETONESUR NEGATIVE 09/17/2022 2236   PROTEINUR NEGATIVE 09/17/2022 2236   UROBILINOGEN 0.2 12/24/2011 0805   NITRITE NEGATIVE 09/17/2022 2236   LEUKOCYTESUR SMALL (A) 09/17/2022 2236    Sepsis Labs: Lactic Acid, Venous No results found for: "LATICACIDVEN"  MICROBIOLOGY: Recent Results (from the past 240 hours)  Resp panel by RT-PCR (RSV, Flu A&B, Covid) Anterior Nasal Swab     Status: None   Collection Time: 02/03/23 12:16 PM   Specimen: Anterior Nasal Swab  Result Value Ref Range Status   SARS Coronavirus  2 by RT PCR NEGATIVE NEGATIVE Final   Influenza A by PCR NEGATIVE NEGATIVE Final   Influenza B by PCR NEGATIVE NEGATIVE Final    Comment: (NOTE) The Xpert Xpress SARS-CoV-2/FLU/RSV plus assay is intended as an aid in the diagnosis of influenza from Nasopharyngeal swab specimens and should not be used as a sole basis for treatment. Nasal washings and aspirates are unacceptable for Xpert Xpress SARS-CoV-2/FLU/RSV testing.  Fact Sheet for Patients: BloggerCourse.com  Fact Sheet for Healthcare Providers: SeriousBroker.it  This test is not yet approved or cleared by the [place] FDA and has been authorized for detection and/or diagnosis of SARS-CoV-2 by FDA under an Emergency Use Authorization (EUA). This EUA will remain in effect (meaning this test can be used) for the duration of the COVID-19 declaration under Section 564(b)(1) of the Act, 21 U.S.C. section 360bbb-3(b)(1), unless the authorization is terminated or revoked.     Resp Syncytial Virus by PCR NEGATIVE NEGATIVE Final    Comment: (NOTE) Fact Sheet for Patients: BloggerCourse.com  Fact Sheet for Healthcare Providers: SeriousBroker.it  This test is not yet approved or cleared by the [place] FDA and has been authorized for detection and/or diagnosis of SARS-CoV-2 by FDA under an Emergency Use Authorization (EUA). This EUA will remain in effect (meaning this test can be used) for the duration of the COVID-19 declaration under Section 564(b)(1) of the Act, 21 U.S.C. section 360bbb-3(b)(1), unless the authorization is terminated or revoked.  Performed at [facility], [address], [place], [place] [address]   MRSA Next Gen by PCR, Nasal     Status: None   Collection Time: 02/03/23  2:44 PM   Specimen: Nasal Mucosa; Nasal Swab  Result Value Ref Range Status   MRSA by PCR Next Gen NOT DETECTED NOT  DETECTED Final    Comment: (NOTE) The GeneXpert MRSA Assay (FDA approved for NASAL specimens only), is one component of a comprehensive MRSA colonization surveillance program. It is not intended to diagnose MRSA infection nor to guide or monitor treatment for MRSA infections. Test performance is not FDA approved in patients less than 30 years old. Performed at [facility], [address] [place], [place] [address]   Respiratory (~20 pathogens) panel by PCR  Status: None   Collection Time: 02/03/23  3:22 PM   Specimen: Nasopharyngeal Swab; Respiratory  Result Value Ref Range Status   Adenovirus NOT DETECTED NOT DETECTED Final   Coronavirus 229E NOT DETECTED NOT DETECTED Final    Comment: (NOTE) The Coronavirus on the Respiratory Panel, DOES NOT test for the novel  Coronavirus (2019 nCoV)    Coronavirus HKU1 NOT DETECTED NOT DETECTED Final   Coronavirus NL63 NOT DETECTED NOT DETECTED Final   Coronavirus OC43 NOT DETECTED NOT DETECTED Final   Metapneumovirus NOT DETECTED NOT DETECTED Final   Rhinovirus / Enterovirus NOT DETECTED NOT DETECTED Final   Influenza A NOT DETECTED NOT DETECTED Final   Influenza B NOT DETECTED NOT DETECTED Final   Parainfluenza Virus 1 NOT DETECTED NOT DETECTED Final   Parainfluenza Virus 2 NOT DETECTED NOT DETECTED Final   Parainfluenza Virus 3 NOT DETECTED NOT DETECTED Final   Parainfluenza Virus 4 NOT DETECTED NOT DETECTED Final   Respiratory Syncytial Virus NOT DETECTED NOT DETECTED Final   Bordetella pertussis NOT DETECTED NOT DETECTED Final   Bordetella Parapertussis NOT DETECTED NOT DETECTED Final   Chlamydophila pneumoniae NOT DETECTED NOT DETECTED Final   Mycoplasma pneumoniae NOT DETECTED NOT DETECTED Final    Comment: Performed at [facility], [address] [place], [place] [address]    RADIOLOGY STUDIES/RESULTS: No results found.   LOS: 4 days   Jeoffrey Massed, MD  [facility]    To contact the attending  provider between 7A-7P or the covering provider during after hours 7P-7A, please log into the web site [email or web address] and access using universal [place] password for that web site. If you do not have the password, please call the hospital operator.  02/07/2023, 11:06 AM

## 2023-02-07 NOTE — Progress Notes (Signed)
   02/07/23 1446  TOC Brief Assessment  Insurance and Status Reviewed (Medicare A and B)  Patient has primary care physician Yes Thornell Mule, [place], DO)  Home environment has been reviewed From home with Spouse  Prior level of function: independent  Prior/Current Home Services Current home services ([facility] is providing Home Health PTA (PT and OT))  Social Drivers of Health Review SDOH reviewed no interventions necessary  Readmission risk has been reviewed Yes (32%)  Transition of care needs transition of care needs identified, TOC will continue to follow   TOC will follow for RECS. If patient goes home, [facility] will resume HH PT and OT services.

## 2023-02-07 NOTE — Evaluation (Signed)
Occupational Therapy Evaluation Patient Details Name: Gary [name] MRN: [id number] DOB: February 11, 1945 Today's Date: 02/07/2023   History of Present Illness Pt is 77 yo male admitted on 1/30/425 with acute on chronic resp failure with hypoxia.  Pt with hx including but not limited to Wegener's granulomatosis, ILD, former smoker, CAD/CABG, afib, CHF, DM2   Clinical Impression   Pt reports ind at baseline with ADL/functional mobility, lives with spouse who can assist at d/c. Pt currently needing up to mod A for ADLs, and CGA for transfers/ambulation in room with 1 person HHA. Pt with 3/4 DOE after walking to door and back, SpO2 dropping from 92% to 69% (questionable pleth) and pt reporting increased SOB, incr to 10L and pt able to return to mid 80's after a few mins. Pt presenting with impairments listed below, will follow acutely. Recommend HHOT at d/c pending progression.       If plan is discharge home, recommend the following: A little help with walking and/or transfers;A lot of help with bathing/dressing/bathroom;Assistance with cooking/housework;Help with stairs or ramp for entrance;Assist for transportation    Functional Status Assessment  Patient has had a recent decline in their functional status and demonstrates the ability to make significant improvements in function in a reasonable and predictable amount of time.  Equipment Recommendations  None recommended by OT (pt has all needed DME)    Recommendations for Other Services PT consult     Precautions / Restrictions Precautions Precautions: Other (comment) Precaution Comments: watch sats Restrictions Weight Bearing Restrictions Per Provider Order: No      Mobility Bed Mobility               General bed mobility comments: EOB upon arrival and departure    Transfers Overall transfer level: Needs assistance Equipment used: None Transfers: Sit to/from Stand Sit to Stand: Contact guard assist           General  transfer comment: no physical assist      Balance Overall balance assessment: Mild deficits observed, not formally tested                                         ADL either performed or assessed with clinical judgement   ADL Overall ADL's : Needs assistance/impaired Eating/Feeding: Set up   Grooming: Set up;Sitting   Upper Body Bathing: Sitting;Moderate assistance   Lower Body Bathing: Sitting/lateral leans;Moderate assistance   Upper Body Dressing : Minimal assistance   Lower Body Dressing: Minimal assistance   Toilet Transfer: Contact guard assist;Ambulation;Regular Toilet   Toileting- Clothing Manipulation and Hygiene: Contact guard assist       Functional mobility during ADLs: Contact guard assist;Rolling walker (2 wheels)       Vision   Vision Assessment?: No apparent visual deficits     Perception Perception: Not tested       Praxis Praxis: Not tested       Pertinent Vitals/Pain Pain Assessment Pain Assessment: No/denies pain     Extremity/Trunk Assessment Upper Extremity Assessment Upper Extremity Assessment: Generalized weakness   Lower Extremity Assessment Lower Extremity Assessment: Defer to PT evaluation   Cervical / Trunk Assessment Cervical / Trunk Assessment: Normal   Communication Communication Communication: No apparent difficulties   Cognition Arousal: Alert Behavior During Therapy: WFL for tasks assessed/performed Overall Cognitive Status: Within Functional Limits for tasks assessed  General Comments  SpO2 down to low 70's on 10L during session, needed ~3 mins at 10-15 (15L is the next option for O2 tank after 10) to improve O2 to 80s    Exercises     Shoulder Instructions      Home Living Family/patient expects to be discharged to:: Private residence Living Arrangements: Spouse/significant other Available Help at Discharge: Family;Available 24  hours/day Type of Home: House Home Access: Stairs to enter [employer] of Steps: 2 Entrance Stairs-Rails: Right Home Layout: Two level;Able to live on main level with bedroom/bathroom     Bathroom Shower/Tub: Producer, television/film/video: Handicapped height     Home Equipment: Grab bars - tub/shower;Shower Counsellor (2 wheels);Rollator (4 wheels);Other (comment);Transport chair   Additional Comments: son may be building ramp; 2-4L O2      Prior Functioning/Environment Prior Level of Function : Independent/Modified Independent;Driving             Mobility Comments: amb household distances with RW. 2-4L supplemental O2 ADLs Comments: Reports independent with adls and iadls        OT Problem List: Decreased strength;Decreased range of motion;Decreased activity tolerance;Impaired balance (sitting and/or standing);Cardiopulmonary status limiting activity      OT Treatment/Interventions: Self-care/ADL training;Therapeutic exercise;Energy conservation;DME and/or AE instruction;Therapeutic activities;Balance training;Patient/family education    OT Goals(Current goals can be found in the care plan section) Acute Rehab OT Goals Patient Stated Goal: none stated OT Goal Formulation: With patient Time For Goal Achievement: 02/21/23 Potential to Achieve Goals: Good ADL Goals Pt Will Perform Lower Body Dressing: Independently;sitting/lateral leans;sit to/from stand Pt Will Transfer to Toilet: Independently;ambulating;regular height toilet Additional ADL Goal #1: pt will tolerate OOB activity x10 min with SpO2 above 90% in prep for ADLs  OT Frequency: Min 1X/week    Co-evaluation              AM-PAC OT "6 Clicks" Daily Activity     Outcome Measure Help from another person eating meals?: A Little Help from another person taking care of personal grooming?: A Little Help from another person toileting, which includes using toliet, bedpan, or urinal?: A  Little Help from another person bathing (including washing, rinsing, drying)?: A Lot Help from another person to put on and taking off regular upper body clothing?: A Little Help from another person to put on and taking off regular lower body clothing?: A Little 6 Click Score: 17   End of Session Equipment Utilized During Treatment: Gait belt;Oxygen Nurse Communication: Mobility status  Activity Tolerance: Patient tolerated treatment well Patient left: in bed;with call bell/phone within reach  OT Visit Diagnosis: Unsteadiness on feet (R26.81);Other abnormalities of gait and mobility (R26.89);Muscle weakness (generalized) (M62.81)                Time: 0272-5366 OT Time Calculation (min): 27 min Charges:  OT General Charges $OT Visit: 1 Visit OT Evaluation $OT Eval Moderate Complexity: 1 Mod OT Treatments $Self Care/Home Management : 8-22 mins  Carver Fila, OTD, OTR/L SecureChat Preferred Acute Rehab [phone]   Carver Fila Koonce 02/07/2023, 8:46 AM

## 2023-02-08 ENCOUNTER — Other Ambulatory Visit (HOSPITAL_COMMUNITY): Payer: Self-pay

## 2023-02-08 ENCOUNTER — Ambulatory Visit: Payer: Medicare Other

## 2023-02-08 DIAGNOSIS — J9621 Acute and chronic respiratory failure with hypoxia: Secondary | ICD-10-CM | POA: Diagnosis not present

## 2023-02-08 DIAGNOSIS — I5032 Chronic diastolic (congestive) heart failure: Secondary | ICD-10-CM | POA: Diagnosis not present

## 2023-02-08 DIAGNOSIS — J849 Interstitial pulmonary disease, unspecified: Secondary | ICD-10-CM | POA: Diagnosis not present

## 2023-02-08 LAB — BASIC METABOLIC PANEL
Anion gap: 13 (ref 5–15)
BUN: 24 mg/dL — ABNORMAL HIGH (ref 8–23)
CO2: 27 mmol/L (ref 22–32)
Calcium: 9.3 mg/dL (ref 8.9–10.3)
Chloride: 102 mmol/L (ref 98–111)
Creatinine, Ser: 0.84 mg/dL (ref 0.61–1.24)
GFR, Estimated: >60 mL/min (ref >60)
Glucose, Bld: 148 mg/dL — ABNORMAL HIGH (ref 70–99)
Potassium: 4 mmol/L (ref 3.5–5.1)
Sodium: 142 mmol/L (ref 135–145)

## 2023-02-08 LAB — CBC WITH DIFFERENTIAL/PLATELET
Abs Immature Granulocytes: 0.37 10*3/uL — ABNORMAL HIGH (ref 0.00–0.07)
Basophils Absolute: 0 10*3/uL (ref 0.0–0.1)
Basophils Relative: 0 %
Eosinophils Absolute: 0 10*3/uL (ref 0.0–0.5)
Eosinophils Relative: 0 %
HCT: 32.2 % — ABNORMAL LOW (ref 39.0–52.0)
Hemoglobin: 9.3 g/dL — ABNORMAL LOW (ref 13.0–17.0)
Immature Granulocytes: 3 %
Lymphocytes Relative: 5 %
Lymphs Abs: 0.7 10*3/uL (ref 0.7–4.0)
MCH: 25.1 pg — ABNORMAL LOW (ref 26.0–34.0)
MCHC: 28.9 g/dL — ABNORMAL LOW (ref 30.0–36.0)
MCV: 87 fL (ref 80.0–100.0)
Monocytes Absolute: 0.6 10*3/uL (ref 0.1–1.0)
Monocytes Relative: 4 %
Neutro Abs: 12.4 10*3/uL — ABNORMAL HIGH (ref 1.7–7.7)
Neutrophils Relative %: 88 %
Platelets: 389 10*3/uL (ref 150–400)
RBC: 3.7 MIL/uL — ABNORMAL LOW (ref 4.22–5.81)
RDW: 25.1 % — ABNORMAL HIGH (ref 11.5–15.5)
WBC: 14 10*3/uL — ABNORMAL HIGH (ref 4.0–10.5)
nRBC: 2.4 % — ABNORMAL HIGH (ref 0.0–0.2)

## 2023-02-08 LAB — GLUCOSE, CAPILLARY
Glucose-Capillary: 195 mg/dL — ABNORMAL HIGH (ref 70–99)
Glucose-Capillary: 201 mg/dL — ABNORMAL HIGH (ref 70–99)
Glucose-Capillary: 224 mg/dL — ABNORMAL HIGH (ref 70–99)
Glucose-Capillary: 174 mg/dL — ABNORMAL HIGH (ref 70–99)

## 2023-02-08 LAB — PROCALCITONIN: Procalcitonin: 0.28 ng/mL

## 2023-02-08 MED ORDER — SODIUM CHLORIDE 0.9 % IV SOLN
510.0000 mg | Freq: Once | INTRAVENOUS | Status: AC
  Administered 2023-02-08: 510 mg via INTRAVENOUS
  Filled 2023-02-08: qty 17

## 2023-02-08 MED ORDER — ORAL CARE MOUTH RINSE: 15.0000 mL | OROMUCOSAL | Status: DC | PRN

## 2023-02-08 NOTE — Progress Notes (Signed)
 NAME:  Gary [name], MRN:  [id number], DOB:  Jan 22, 1945, LOS: 5 ADMISSION DATE:  02/03/2023, CONSULTATION DATE:  02/03/2023 REFERRING MD:  Dr. Lavada Stank, CHIEF COMPLAINT:  dyspnea, hypoxia   History of Present Illness:  This 78 year old Caucasian male is seen in consultation at the request of Dr. Lavada Stank for recommendations on further evaluation and management of hypoxia.  The patient was sent to [facility] emergency department from [facility] pulmonary clinic today, after he presented with reports of having profound hypoxia at home (down to SpO2 45%).  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  He is on rituximab  and steroids.  In fact, he was recently hospitalized for interstitial lung disease flareup and has been on a prednisone  taper, currently on prednisone  40 mg p.o. daily.  Treated with high-dose steroids.  Right heart cath on 01/04/2023 showed normal filling pressures, mild pulmonary hypertension and preserved cardiac output.  He reported doing well after his recent hospitalization and felt that he had a very good day yesterday.  However, this morning he woke up and experienced extreme exertional dyspnea while getting from his bed to the bathroom, when he noted that his SpO2 went down to 45%.  He is normally on supplemental oxygen  at 2-2.5 LPM via nasal cannula.  He does titrate as needed to maintain SpO2 90%.  However, he noted that despite turning his supplemental oxygen  up to 6 LPM (which he typically needs during ambulation), he was not recovering.  He denies cough.  He denies pleuritic pain.  He denies sputum production.  He denies chest pain.  He does state that when he was profoundly hypoxic he did notice that he was experiencing blurring of vision.  He has noted some modest increased swelling in his lower extremities.  He does confess that he is prescribed daily furosemide  but is inconsistent with adherence to  prescribed therapy.  Patient says he has had a very stressful and very active week, which revolves around having to get to multiple medical appointments.  He had an iron infusion a few days ago.  He also had a Rituxan  infusion earlier this week.  Patient says he has been having blood in his stools, although he says that this is not any more than usual.  This has been a chronic problem ever since his diagnosis of Wegener's granulomatosis.    He is on Coumadin  for chronic atrial fibrillation.  His last INR check (01/24/2023) was supratherapeutic, 6.7.  However, upon recheck in the emergency department, this has fallen into the therapeutic range, 2.7.  Last hemoglobin reported by patient was 8.2, currently 8.7.   Pertinent  Medical History  PAST MEDICAL history significant for Wegener's granulomatosis (diagnosed 2019, on Rituxan  and steroids), type 2 diabetes mellitus, OSA on CPAP, atrial fibrillation on coumadin , HFpEF (LVEF 65 in 12/28/2022, grade 1 diastolic dysfunction), pulmonary hypertension, coronary artery disease.  Significant Hospital Events: Including procedures, antibiotic start and stop dates in addition to other pertinent events   1/30 presented with shortness of breath and hypoxia to steroids started 1/31 states he feels well but is requiring 8 to 10 L high flow nasal cannula 2/2 no acute issues overnight, remains on 6 L high flow  Interim History / Subjective:  No acute issues overnight. Resting in bed after getting up with OT, more SOB after activity. States talking to family on the phone really took my breath. Still requiring ~8L at rest, up to 12L with  activity this am.    Objective   Blood pressure 137/82, pulse 87, temperature 98.2 F (36.8 C), temperature source Oral, resp. rate (!) 37, height 5' 10 (1.778 m), weight 91.5 kg, SpO2 91%.        Intake/Output Summary (Last 24 hours) at 02/08/2023 9062 Last data filed at 02/08/2023 9071 Gross per 24 hour  Intake --  Output  700 ml  Net -700 ml   Filed Weights   02/03/23 1227 02/04/23 1906  Weight: 94.8 kg 91.5 kg    Examination: General: Acute on chronic ill-appearing elderly male dyspneic but NAD in bed HEENT: [place]/AT, MM pink/moist, PERRL,  Neuro: Alert and oriented x 3, nonfocal CV: s1s2 regular rate and rhythm, no murmur, rubs, or gallops,  PULM: resps even non labored on [place], scattered crackles  GI: soft, bowel sounds active in all 4 quadrants, non-tender, non-distended, tolerating  Extremities: warm/dry, no edema  Skin: no rashes or lesions   Resolved Hospital Problem list   Not applicable.  Assessment & Plan:  GPA in flare Hx afib on AC/tikosyn  -Discussed with Dr. Geronimo; start cellcept  and likely hold off on further rituxan  in future P: Continue high-dose steroids for now, will wean with taper plan outlined closer to d/c Wean supplemental oxygen  as able, SpO2 goal greater than 88 - goal closer to 2-3L at rest  F/u CXR in am  PT/OT Continue CellCept , steroids, dapsone   Aspiration precautions Mobilize as able  Patient would benefit from pulmonary rehab consult upon discharge  Rockie Myers, NP Pulmonary/Critical Care Medicine  02/08/2023  9:37 AM   See Amion for personal pager PCCM on call pager [phone] until 7pm. Please call Elink 7p-7a. [phone]

## 2023-02-08 NOTE — Progress Notes (Addendum)
 PROGRESS NOTE        PATIENT DETAILS Name: Gary [name] Age: 78 y.o. Sex: male Date of Birth: 12-Jan-1945 Admit Date: 02/03/2023 Admitting Physician Lavada MARLA Stank, MD ERE:Dxjxoz, Massie, DO  Brief Summary: Patient is a 78 y.o.  male with history of granulomatosis polyangiitis-ILD-chronic hypoxic respiratory failure on 3-4 L of oxygen  at home, CAD s/p PCI September 2023, A-fib on warfarin, chronic HFpEF-recent hospitalization for ILD flare-on tapering steroids-presented with shortness of breath-found to have acute hypoxic respiratory failure-requiring HFNC-secondary to ILD flare (while on prednisone  40 mg daily)   Significant events: 1/30>> admit to [facility]  Significant studies: 1/30>> CXR: Worsening right-sided infiltrates.  Significant microbiology data: 1/30>> respiratory virus panel: Negative 1/30>> COVID/influenza/RSV PCR: Negative  Procedures: None  Consults: PCCM  Subjective: Unchanged-still requiring anywhere from 6-8 L-gets short of breath with minimal activity.  Objective: Vitals: Blood pressure 137/82, pulse 87, temperature 98.2 F (36.8 C), temperature source Oral, resp. rate (!) 37, height 5' 10 (1.778 m), weight 91.5 kg, SpO2 91%.   Exam: Gen Exam:Alert awake-not in any distress HEENT:atraumatic, normocephalic Chest: B/L clear to auscultation anteriorly CVS:S1S2 regular Abdomen:soft non tender, non distended Extremities:no edema Neurology: Non focal Skin: no rash  Pertinent Labs/Radiology:    Latest Ref Rng & Units 02/08/2023    4:53 AM 02/07/2023    5:09 AM 02/06/2023    5:34 AM  CBC  WBC 4.0 - 10.5 K/uL 14.0  12.6  15.7   Hemoglobin 13.0 - 17.0 g/dL 9.3  8.4  8.8   Hematocrit 39.0 - 52.0 % 32.2  29.4  30.5   Platelets 150 - 400 K/uL 389  376  412     Lab Results  Component Value Date   NA 142 02/08/2023   K 4.0 02/08/2023   CL 102 02/08/2023   CO2 27 02/08/2023      Assessment/Plan: Acute on chronic hypoxic  respiratory failure secondary to ILD flare-history of granulomatosis with polyangiitis (on home O2-anywhere from 3-4 L) Overall better-still with pretty fluctuant oxygen  requirements-anywhere from 5 to-8 L  Clinically unchanged-short of breath with minimal activity On steroids/CellCept  Mobilize as much as possible  PCCM following Continue attempts to slowly titrate down FiO2  History of granulomatosis with polyangiitis/Wegener's granulomatosis Was on tapering steroids (down to 40 mg)-when he developed worsening respiratory failure Prophylactic dapsone  for PJP (no Bactrim  as interaction with Tikosyn ) PCCM has started CellCept -no longer on Rituxan  Currently on IV Solu-Medrol  CRP downtrending.  Paroxysmal atrial fibrillation Maintaining sinus rhythm Tikosyn /Coreg  Has been transitioned to Eliquis -no plans to restart Coumadin  on discharge.  Chronic HFpEF Euvolemic  History of CAD s/p PCI September 2023 Aspirin  No longer on Plavix -this was also not on his most recent discharge list of medications.  Iron deficiency anemia Appreciate GI input-plans are for outpatient endoscopy evaluation once his respiratory status improves No overt bleeding noted-Hb relatively stable for the past several days Discussed with pharmacy-patient was supposed to get IV iron infusion as an outpatient-this will be given to him today.  Hypothyroidism Synthroid   DM-2 CBG stable on SSI  Recent Labs    02/07/23 1610 02/07/23 2125 02/08/23 0926  GLUCAP 126* 237* 195*    Mood disorder Stable Continue Effexor    BMI: Estimated body mass index is 28.94 kg/m as calculated from the following:   Height as of this encounter: 5' 10 (1.778 m).  Weight as of this encounter: 91.5 kg.   Code status:   Code Status: Full Code   DVT Prophylaxis: SCDs Start: 02/03/23 1523 apixaban  (ELIQUIS ) tablet 5 mg    Family Communication:Spouse [name] left VM 2/4   Disposition Plan: Status is:  Inpatient Remains inpatient appropriate because: Severity of illness   Planned Discharge Destination:Home health   Diet: Diet Order             Diet regular Fluid consistency: Thin  Diet effective now                     Antimicrobial agents: Anti-infectives (From admission, onward)    Start     Dose/Rate Route Frequency Ordered Stop   02/03/23 1445  dapsone  tablet 100 mg        100 mg Oral Daily 02/03/23 1442          MEDICATIONS: Scheduled Meds:  ALPRAZolam   0.5 mg Oral QHS   apixaban   5 mg Oral BID   aspirin   81 mg Oral Daily   carvedilol   3.125 mg Oral BID WC   dapsone   100 mg Oral Daily   dofetilide   500 mcg Oral BID   feeding supplement  237 mL Oral BID BM   insulin  aspart  0-5 Units Subcutaneous QHS   insulin  aspart  0-9 Units Subcutaneous TID WC   levothyroxine   137 mcg Oral QAC breakfast   methylPREDNISolone  (SOLU-MEDROL ) injection  125 mg Intravenous Q12H   mycophenolate   500 mg Oral BID   pantoprazole   40 mg Oral BID   venlafaxine  XR  150 mg Oral Daily   Continuous Infusions:   PRN Meds:.acetaminophen  **OR** acetaminophen , bisacodyl , metoprolol  tartrate, nitroGLYCERIN , mouth rinse, promethazine    I have personally reviewed following labs and imaging studies  LABORATORY DATA: CBC: Recent Labs  Lab 02/04/23 0454 02/05/23 0531 02/06/23 0534 02/07/23 0509 02/08/23 0453  WBC 11.7* 14.5* 15.7* 12.6* 14.0*  NEUTROABS 10.4* 12.8* 13.8* 10.7* 12.4*  HGB 9.1* 8.6* 8.8* 8.4* 9.3*  HCT 32.5* 29.9* 30.5* 29.4* 32.2*  MCV 87.4 85.4 86.4 87.2 87.0  PLT 337 391 412* 376 389    Basic Metabolic Panel: Recent Labs  Lab 02/03/23 1443 02/04/23 0454 02/05/23 0531 02/06/23 0534 02/07/23 0509 02/08/23 0453  NA  --  138 140 139 142 142  K  --  4.0 3.9 3.9 4.4 4.0  CL  --  104 102 104 107 102  CO2  --  22 23 25 25 27   GLUCOSE  --  160* 161* 150* 152* 148*  BUN  --  23 28* 33* 28* 24*  CREATININE  --  0.89 1.13 1.03 1.01 0.84  CALCIUM   --  9.0  9.2 9.1 9.3 9.3  MG 2.3 2.2 2.4 2.3 2.3  --   PHOS  --  4.2 3.8 3.4 3.6  --     GFR: Estimated Creatinine Clearance: 81.1 mL/min (by C-G formula based on SCr of 0.84 mg/dL).  Liver Function Tests: Recent Labs  Lab 02/03/23 1304  AST 23  ALT 25  ALKPHOS 72  BILITOT 0.6  PROT 5.3*  ALBUMIN  3.0*   No results for input(s): LIPASE, AMYLASE in the last 168 hours. No results for input(s): AMMONIA in the last 168 hours.  Coagulation Profile: Recent Labs  Lab 02/03/23 1304 02/04/23 0454 02/06/23 1033 02/07/23 0509  INR 2.7* 1.8* 1.2 1.2    Cardiac Enzymes: No results for input(s): CKTOTAL, CKMB, CKMBINDEX, TROPONINI in the  last 168 hours.  BNP (last 3 results) Recent Labs    11/04/22 1519  PROBNP 416.0*    Lipid Profile: No results for input(s): CHOL, HDL, LDLCALC, TRIG, CHOLHDL, LDLDIRECT in the last 72 hours.  Thyroid  Function Tests: No results for input(s): TSH, T4TOTAL, FREET4, T3FREE, THYROIDAB in the last 72 hours.  Anemia Panel: No results for input(s): VITAMINB12, FOLATE, FERRITIN, TIBC, IRON, RETICCTPCT in the last 72 hours.   Urine analysis:    Component Value Date/Time   COLORURINE YELLOW 09/17/2022 2236   APPEARANCEUR HAZY (A) 09/17/2022 2236   LABSPEC 1.010 09/17/2022 2236   PHURINE 6.5 09/17/2022 2236   GLUCOSEU NEGATIVE 09/17/2022 2236   HGBUR NEGATIVE 09/17/2022 2236   BILIRUBINUR NEGATIVE 09/17/2022 2236   KETONESUR NEGATIVE 09/17/2022 2236   PROTEINUR NEGATIVE 09/17/2022 2236   UROBILINOGEN 0.2 12/24/2011 0805   NITRITE NEGATIVE 09/17/2022 2236   LEUKOCYTESUR SMALL (A) 09/17/2022 2236    Sepsis Labs: Lactic Acid, Venous No results found for: LATICACIDVEN  MICROBIOLOGY: Recent Results (from the past 240 hours)  Resp panel by RT-PCR (RSV, Flu A&B, Covid) Anterior Nasal Swab     Status: None   Collection Time: 02/03/23 12:16 PM   Specimen: Anterior Nasal Swab  Result Value Ref Range  Status   SARS Coronavirus 2 by RT PCR NEGATIVE NEGATIVE Final   Influenza A by PCR NEGATIVE NEGATIVE Final   Influenza B by PCR NEGATIVE NEGATIVE Final    Comment: (NOTE) The Xpert Xpress SARS-CoV-2/FLU/RSV plus assay is intended as an aid in the diagnosis of influenza from Nasopharyngeal swab specimens and should not be used as a sole basis for treatment. Nasal washings and aspirates are unacceptable for Xpert Xpress SARS-CoV-2/FLU/RSV testing.  Fact Sheet for Patients: bloggercourse.com  Fact Sheet for Healthcare Providers: seriousbroker.it  This test is not yet approved or cleared by the [place]  FDA and has been authorized for detection and/or diagnosis of SARS-CoV-2 by FDA under an Emergency Use Authorization (EUA). This EUA will remain in effect (meaning this test can be used) for the duration of the COVID-19 declaration under Section 564(b)(1) of the Act, 21 U.S.C. section 360bbb-3(b)(1), unless the authorization is terminated or revoked.     Resp Syncytial Virus by PCR NEGATIVE NEGATIVE Final    Comment: (NOTE) Fact Sheet for Patients: bloggercourse.com  Fact Sheet for Healthcare Providers: seriousbroker.it  This test is not yet approved or cleared by the [place]  FDA and has been authorized for detection and/or diagnosis of SARS-CoV-2 by FDA under an Emergency Use Authorization (EUA). This EUA will remain in effect (meaning this test can be used) for the duration of the COVID-19 declaration under Section 564(b)(1) of the Act, 21 U.S.C. section 360bbb-3(b)(1), unless the authorization is terminated or revoked.  Performed at [facility], [address] [place], [place] [address]   MRSA Next Gen by PCR, Nasal     Status: None   Collection Time: 02/03/23  2:44 PM   Specimen: Nasal Mucosa; Nasal Swab  Result Value Ref Range Status   MRSA by PCR Next  Gen NOT DETECTED NOT DETECTED Final    Comment: (NOTE) The GeneXpert MRSA Assay (FDA approved for NASAL specimens only), is one component of a comprehensive MRSA colonization surveillance program. It is not intended to diagnose MRSA infection nor to guide or monitor treatment for MRSA infections. Test performance is not FDA approved in patients less than 24 years old. Performed at [facility], [address], [place],  [place] [address]   Respiratory (~20 pathogens) panel by PCR     Status: None   Collection Time: 02/03/23  3:22 PM   Specimen: Nasopharyngeal Swab; Respiratory  Result Value Ref Range Status   Adenovirus NOT DETECTED NOT DETECTED Final   Coronavirus 229E NOT DETECTED NOT DETECTED Final    Comment: (NOTE) The Coronavirus on the Respiratory Panel, DOES NOT test for the novel  Coronavirus (2019 nCoV)    Coronavirus HKU1 NOT DETECTED NOT DETECTED Final   Coronavirus NL63 NOT DETECTED NOT DETECTED Final   Coronavirus OC43 NOT DETECTED NOT DETECTED Final   Metapneumovirus NOT DETECTED NOT DETECTED Final   Rhinovirus / Enterovirus NOT DETECTED NOT DETECTED Final   Influenza A NOT DETECTED NOT DETECTED Final   Influenza B NOT DETECTED NOT DETECTED Final   Parainfluenza Virus 1 NOT DETECTED NOT DETECTED Final   Parainfluenza Virus 2 NOT DETECTED NOT DETECTED Final   Parainfluenza Virus 3 NOT DETECTED NOT DETECTED Final   Parainfluenza Virus 4 NOT DETECTED NOT DETECTED Final   Respiratory Syncytial Virus NOT DETECTED NOT DETECTED Final   Bordetella pertussis NOT DETECTED NOT DETECTED Final   Bordetella Parapertussis NOT DETECTED NOT DETECTED Final   Chlamydophila pneumoniae NOT DETECTED NOT DETECTED Final   Mycoplasma pneumoniae NOT DETECTED NOT DETECTED Final    Comment: Performed at [facility], [address] [place], [place] [address]    RADIOLOGY STUDIES/RESULTS: No results found.   LOS: 5 days   Donalda Applebaum, MD  [facility]    To  contact the attending provider between 7A-7P or the covering provider during after hours 7P-7A, please log into the web site [email or web address] and access using universal [place] password for that web site. If you do not have the password, please call the hospital operator.  02/08/2023, 11:23 AM

## 2023-02-08 NOTE — Progress Notes (Signed)
 Occupational Therapy Treatment Patient Details Name: Gary [name] MRN: [id number] DOB: 1945-10-12 Today's Date: 02/08/2023   History of present illness Pt is 78 yo male admitted on 1/30/425 with acute on chronic resp failure with hypoxia.  Pt with hx including but not limited to Wegener's granulomatosis, ILD, former smoker, CAD/CABG, afib, CHF, DM2   OT comments  Pt making slow progress towards goals this session, remains motivated to improve, however is limited by SpO2 and SOB. Pt able to stand and take side steps at EOB before needing to sit. Pt needing up to 12L O2 for standing/transfer, dropping at lowest to 79% and needed ~5 min to recover to high 80's/low 90's when titrated back down to 8L O2. Encouraged pt to sit upright in bed/EOB as tolerated to improve breathing, along with bed level exercises. Pt presenting with impairments listed below, will follow acutely. Continue to recommend HHOT at d/c given respiratory status/activity tolerance improves.      If plan is discharge home, recommend the following:  A little help with walking and/or transfers;A lot of help with bathing/dressing/bathroom;Assistance with cooking/housework;Help with stairs or ramp for entrance;Assist for transportation   Equipment Recommendations  None recommended by OT    Recommendations for Other Services PT consult    Precautions / Restrictions Precautions Precautions: Other (comment) Precaution Comments: watch sats Restrictions Weight Bearing Restrictions Per Provider Order: No       Mobility Bed Mobility Overal bed mobility: Modified Independent             General bed mobility comments: incr time and use of bed rails    Transfers Overall transfer level: Needs assistance Equipment used: Rolling walker (2 wheels) Transfers: Sit to/from Stand Sit to Stand: Contact guard assist                 Balance Overall balance assessment: Mild deficits observed, not formally tested                                          ADL either performed or assessed with clinical judgement   ADL Overall ADL's : Needs assistance/impaired                         Toilet Transfer: Contact guard assist;Stand-pivot;BSC/3in1 Statistician Details (indicate cue type and reason): simulated side stepping EOB         Functional mobility during ADLs: Contact guard assist      Extremity/Trunk Assessment Upper Extremity Assessment Upper Extremity Assessment: Generalized weakness   Lower Extremity Assessment Lower Extremity Assessment: Defer to PT evaluation        Vision   Vision Assessment?: No apparent visual deficits Additional Comments: does keep R eye squinted/closed   Perception Perception Perception: Not tested   Praxis Praxis Praxis: Not tested    Cognition Arousal: Alert Behavior During Therapy: WFL for tasks assessed/performed Overall Cognitive Status: Within Functional Limits for tasks assessed                                          Exercises      Shoulder Instructions       General Comments Pt needing up to 12L for standing and side steps to R,  down to 79% but able to  recover to 90's within ~5 mins and incr time to titrate from 12L to 8L    Pertinent Vitals/ Pain       Pain Assessment Pain Assessment: No/denies pain  Home Living                                          Prior Functioning/Environment              Frequency  Min 1X/week        Progress Toward Goals  OT Goals(current goals can now be found in the care plan section)  Progress towards OT goals: Progressing toward goals  Acute Rehab OT Goals Patient Stated Goal: none stated OT Goal Formulation: With patient Time For Goal Achievement: 02/21/23 Potential to Achieve Goals: Good ADL Goals Pt Will Perform Lower Body Dressing: Independently;sitting/lateral leans;sit to/from stand Pt Will Transfer to Toilet:  Independently;ambulating;regular height toilet Additional ADL Goal #1: pt will tolerate OOB activity x10 min with SpO2 above 90% in prep for ADLs  Plan      Co-evaluation                 AM-PAC OT 6 Clicks Daily Activity     Outcome Measure   Help from another person eating meals?: A Little Help from another person taking care of personal grooming?: A Little Help from another person toileting, which includes using toliet, bedpan, or urinal?: A Lot Help from another person bathing (including washing, rinsing, drying)?: A Lot Help from another person to put on and taking off regular upper body clothing?: A Little Help from another person to put on and taking off regular lower body clothing?: A Lot 6 Click Score: 15    End of Session Equipment Utilized During Treatment: Oxygen  (8-12L)  OT Visit Diagnosis: Unsteadiness on feet (R26.81);Other abnormalities of gait and mobility (R26.89);Muscle weakness (generalized) (M62.81)   Activity Tolerance Patient tolerated treatment well   Patient Left in bed;with call bell/phone within reach;with bed alarm set   Nurse Communication Mobility status        Time: 9248-9186 OT Time Calculation (min): 22 min  Charges: OT General Charges $OT Visit: 1 Visit OT Treatments $Therapeutic Activity: 8-22 mins  Gary [name], OTD, OTR/L SecureChat Preferred Acute Rehab [phone]   Gary [name] 02/08/2023, 8:38 AM

## 2023-02-08 NOTE — Plan of Care (Signed)
  Problem: Education: Goal: Ability to describe self-care measures that may prevent or decrease complications (Diabetes Survival Skills Education) will improve Outcome: Progressing Goal: Individualized Educational Video(s) Outcome: Progressing   Problem: Coping: Goal: Ability to adjust to condition or change in health will improve Outcome: Progressing   Problem: Fluid Volume: Goal: Ability to maintain a balanced intake and output will improve Outcome: Progressing   Problem: Health Behavior/Discharge Planning: Goal: Ability to identify and utilize available resources and services will improve Outcome: Progressing Goal: Ability to manage health-related needs will improve Outcome: Progressing   Problem: Metabolic: Goal: Ability to maintain appropriate glucose levels will improve Outcome: Progressing   Problem: Nutritional: Goal: Maintenance of adequate nutrition will improve Outcome: Progressing   Problem: Skin Integrity: Goal: Risk for impaired skin integrity will decrease Outcome: Progressing   Problem: Tissue Perfusion: Goal: Adequacy of tissue perfusion will improve Outcome: Progressing   Problem: Education: Goal: Knowledge of General Education information will improve Description: Including pain rating scale, medication(s)/side effects and non-pharmacologic comfort measures Outcome: Progressing   Problem: Clinical Measurements: Goal: Ability to maintain clinical measurements within normal limits will improve Outcome: Progressing Goal: Will remain free from infection Outcome: Progressing Goal: Diagnostic test results will improve Outcome: Progressing Goal: Respiratory complications will improve Outcome: Progressing Goal: Cardiovascular complication will be avoided Outcome: Progressing   Problem: Activity: Goal: Risk for activity intolerance will decrease Outcome: Progressing   Problem: Nutrition: Goal: Adequate nutrition will be maintained Outcome:  Progressing   Problem: Coping: Goal: Level of anxiety will decrease Outcome: Progressing   Problem: Elimination: Goal: Will not experience complications related to bowel motility Outcome: Progressing Goal: Will not experience complications related to urinary retention Outcome: Progressing   Problem: Pain Managment: Goal: General experience of comfort will improve and/or be controlled Outcome: Progressing   Problem: Safety: Goal: Ability to remain free from injury will improve Outcome: Progressing   Problem: Skin Integrity: Goal: Risk for impaired skin integrity will decrease Outcome: Progressing

## 2023-02-08 NOTE — Plan of Care (Signed)
  Problem: Education: Goal: Ability to describe self-care measures that may prevent or decrease complications (Diabetes Survival Skills Education) will improve 02/08/2023 0640 by Harl Marty DEL, RN Outcome: Progressing 02/08/2023 0640 by Harl Marty DEL, RN Outcome: Progressing Goal: Individualized Educational Video(s) 02/08/2023 0640 by Harl Marty DEL, RN Outcome: Progressing 02/08/2023 0640 by Harl Marty DEL, RN Outcome: Progressing   Problem: Coping: Goal: Ability to adjust to condition or change in health will improve 02/08/2023 0640 by Harl Marty DEL, RN Outcome: Progressing 02/08/2023 0640 by Harl Marty DEL, RN Outcome: Progressing   Problem: Fluid Volume: Goal: Ability to maintain a balanced intake and output will improve 02/08/2023 0640 by Harl Marty DEL, RN Outcome: Progressing 02/08/2023 0640 by Harl Marty DEL, RN Outcome: Progressing   Problem: Nutritional: Goal: Maintenance of adequate nutrition will improve 02/08/2023 0640 by Harl Marty DEL, RN Outcome: Progressing 02/08/2023 0640 by Harl Marty DEL, RN Outcome: Progressing   Problem: Metabolic: Goal: Ability to maintain appropriate glucose levels will improve 02/08/2023 0640 by Harl Marty DEL, RN Outcome: Progressing 02/08/2023 0640 by Harl Marty DEL, RN Outcome: Progressing   Problem: Education: Goal: Knowledge of General Education information will improve Description: Including pain rating scale, medication(s)/side effects and non-pharmacologic comfort measures 02/08/2023 0640 by Harl Marty DEL, RN Outcome: Progressing 02/08/2023 0640 by Harl Marty DEL, RN Outcome: Progressing   Problem: Health Behavior/Discharge Planning: Goal: Ability to manage health-related needs will improve 02/08/2023 0640 by Harl Marty DEL, RN Outcome: Progressing 02/08/2023 0640 by Harl Marty DEL, RN Outcome: Progressing   Problem: Clinical Measurements: Goal: Ability to maintain clinical measurements within normal limits  will improve 02/08/2023 0640 by Harl Marty DEL, RN Outcome: Progressing 02/08/2023 0640 by Harl Marty DEL, RN Outcome: Progressing Goal: Will remain free from infection 02/08/2023 0640 by Harl Marty DEL, RN Outcome: Progressing 02/08/2023 0640 by Harl Marty DEL, RN Outcome: Progressing Goal: Diagnostic test results will improve 02/08/2023 0640 by Harl Marty DEL, RN Outcome: Progressing 02/08/2023 0640 by Harl Marty DEL, RN Outcome: Progressing Goal: Respiratory complications will improve 02/08/2023 0640 by Harl Marty DEL, RN Outcome: Progressing 02/08/2023 0640 by Harl Marty DEL, RN Outcome: Progressing Goal: Cardiovascular complication will be avoided 02/08/2023 0640 by Harl Marty DEL, RN Outcome: Progressing 02/08/2023 0640 by Harl Marty DEL, RN Outcome: Progressing

## 2023-02-09 ENCOUNTER — Inpatient Hospital Stay (HOSPITAL_COMMUNITY): Payer: Medicare Other

## 2023-02-09 ENCOUNTER — Inpatient Hospital Stay (HOSPITAL_COMMUNITY): Admission: RE | Admit: 2023-02-09 | Payer: Medicare Other | Source: Ambulatory Visit

## 2023-02-09 ENCOUNTER — Encounter (HOSPITAL_COMMUNITY): Payer: Medicare Other

## 2023-02-09 DIAGNOSIS — I5032 Chronic diastolic (congestive) heart failure: Secondary | ICD-10-CM | POA: Diagnosis not present

## 2023-02-09 DIAGNOSIS — J9621 Acute and chronic respiratory failure with hypoxia: Secondary | ICD-10-CM | POA: Diagnosis not present

## 2023-02-09 DIAGNOSIS — M3130 Wegener's granulomatosis without renal involvement: Secondary | ICD-10-CM | POA: Diagnosis not present

## 2023-02-09 DIAGNOSIS — J849 Interstitial pulmonary disease, unspecified: Secondary | ICD-10-CM | POA: Diagnosis not present

## 2023-02-09 LAB — BASIC METABOLIC PANEL
Anion gap: 11 (ref 5–15)
BUN: 22 mg/dL (ref 8–23)
CO2: 27 mmol/L (ref 22–32)
Calcium: 9.4 mg/dL (ref 8.9–10.3)
Chloride: 101 mmol/L (ref 98–111)
Creatinine, Ser: 0.88 mg/dL (ref 0.61–1.24)
GFR, Estimated: >60 mL/min (ref >60)
Glucose, Bld: 166 mg/dL — ABNORMAL HIGH (ref 70–99)
Potassium: 4.1 mmol/L (ref 3.5–5.1)
Sodium: 139 mmol/L (ref 135–145)

## 2023-02-09 LAB — SEDIMENTATION RATE: Sed Rate: 45 mm/h — ABNORMAL HIGH (ref 0–16)

## 2023-02-09 LAB — GLUCOSE, CAPILLARY
Glucose-Capillary: 142 mg/dL — ABNORMAL HIGH (ref 70–99)
Glucose-Capillary: 363 mg/dL — ABNORMAL HIGH (ref 70–99)
Glucose-Capillary: 352 mg/dL — ABNORMAL HIGH (ref 70–99)
Glucose-Capillary: 320 mg/dL — ABNORMAL HIGH (ref 70–99)

## 2023-02-09 LAB — C-REACTIVE PROTEIN: CRP: 12.4 mg/dL — ABNORMAL HIGH (ref <1.0)

## 2023-02-09 LAB — PROCALCITONIN: Procalcitonin: 0.24 ng/mL

## 2023-02-09 MED ORDER — FUROSEMIDE 10 MG/ML IJ SOLN
40.0000 mg | Freq: Four times a day (QID) | INTRAMUSCULAR | Status: AC
  Administered 2023-02-09 (×2): 40 mg via INTRAVENOUS
  Filled 2023-02-09 (×2): qty 4

## 2023-02-09 MED ORDER — ALBUMIN HUMAN 25 % IV SOLN
25.0000 g | Freq: Four times a day (QID) | INTRAVENOUS | Status: AC
  Administered 2023-02-09 – 2023-02-10 (×4): 25 g via INTRAVENOUS
  Filled 2023-02-09 (×4): qty 100

## 2023-02-09 MED ORDER — FUROSEMIDE 40 MG PO TABS
40.0000 mg | ORAL_TABLET | Freq: Every day | ORAL | Status: DC
  Administered 2023-02-09: 40 mg via ORAL
  Filled 2023-02-09: qty 1

## 2023-02-09 MED ORDER — MEGESTROL ACETATE 40 MG PO TABS: 40.0000 mg | ORAL_TABLET | Freq: Every day | ORAL | Status: DC

## 2023-02-09 MED ORDER — MIRTAZAPINE 15 MG PO TABS: 15.0000 mg | ORAL_TABLET | Freq: Every day | ORAL | Status: DC

## 2023-02-09 MED ORDER — DRONABINOL 5 MG PO CAPS
5.0000 mg | ORAL_CAPSULE | Freq: Every day | ORAL | Status: DC
  Administered 2023-02-10 – 2023-02-20 (×10): 5 mg via ORAL
  Filled 2023-02-09 (×11): qty 2

## 2023-02-09 NOTE — Care Plan (Cosign Needed)
 At 1200, BP: 85/53, SpO2: 89%. Pt had a [place] at 10 L at the time. Due to this condition, I decided to do a respiratory focused assessment. Respiratory pattern was labored with symmetrical chest expansion. Pt's upper breath sounds were consisted of R & L coarse crackles. R & L lower breath sounds were diminished and had coarse crackles as well.  Interventions: furosemide  40 mg and albumin  25% solution 25 g. I recommend continuous respiratory and cardiac monitoring.

## 2023-02-09 NOTE — Progress Notes (Signed)
 PT Cancellation Note  Patient Details Name: Gary [name] MRN: [id number] DOB: 09-12-1945   Cancelled Treatment:    Reason Eval/Treat Not Completed: (P) Patient declined, no reason specified (Pt noted to have increase in O2 to 11L and pt reports shortness of breath at rest. Pt reports completing exercises this morning independently, but declines mobility at this time. Will continue to follow per PT POC.)   Darryle George 02/09/2023, 2:55 PM

## 2023-02-09 NOTE — Plan of Care (Signed)
  Problem: Education: Goal: Ability to describe self-care measures that may prevent or decrease complications (Diabetes Survival Skills Education) will improve Outcome: Progressing Goal: Individualized Educational Video(s) Outcome: Progressing   Problem: Coping: Goal: Ability to adjust to condition or change in health will improve Outcome: Progressing   Problem: Fluid Volume: Goal: Ability to maintain a balanced intake and output will improve Outcome: Progressing   Problem: Health Behavior/Discharge Planning: Goal: Ability to identify and utilize available resources and services will improve Outcome: Progressing Goal: Ability to manage health-related needs will improve Outcome: Progressing   Problem: Metabolic: Goal: Ability to maintain appropriate glucose levels will improve Outcome: Progressing   Problem: Nutritional: Goal: Maintenance of adequate nutrition will improve Outcome: Progressing Goal: Progress toward achieving an optimal weight will improve Outcome: Progressing   Problem: Tissue Perfusion: Goal: Adequacy of tissue perfusion will improve Outcome: Progressing   Problem: Education: Goal: Knowledge of General Education information will improve Description: Including pain rating scale, medication(s)/side effects and non-pharmacologic comfort measures Outcome: Progressing   Problem: Clinical Measurements: Goal: Ability to maintain clinical measurements within normal limits will improve Outcome: Progressing Goal: Will remain free from infection Outcome: Progressing Goal: Diagnostic test results will improve Outcome: Progressing Goal: Respiratory complications will improve Outcome: Progressing Goal: Cardiovascular complication will be avoided Outcome: Progressing   Problem: Activity: Goal: Risk for activity intolerance will decrease Outcome: Progressing   Problem: Elimination: Goal: Will not experience complications related to bowel motility Outcome:  Progressing Goal: Will not experience complications related to urinary retention Outcome: Progressing

## 2023-02-09 NOTE — Progress Notes (Signed)
   02/09/23 0945  Mobility  Activity Transferred from bed to chair  Level of Assistance Contact guard assist, steadying assist  Assistive Device Front wheel walker  Activity Response Tolerated fair  Mobility Referral Yes  Mobility visit 1 Mobility  Mobility Specialist Start Time (ACUTE ONLY) 0915  Mobility Specialist Stop Time (ACUTE ONLY) 0945  Mobility Specialist Time Calculation (min) (ACUTE ONLY) 30 min   Mobility Specialist: Progress Note  Pre-Mobility:      SpO2 93% 9L During Mobility: SpO2 83-90% 9L Post-Mobility:    SpO2 91% 9L       HR 109  Pt agreeable to mobility session - received in bed. C/o SOB with exertion. Pt SOB with talking, required PLB.  Returned to chair with all needs met - call bell within reach. Chair alarm on.   [name], BS Mobility Specialist Please contact via SecureChat or  Rehab office at [phone].

## 2023-02-09 NOTE — Progress Notes (Signed)
 NAME:  Gary [name], MRN:  [id number], DOB:  1945/07/01, LOS: 6 ADMISSION DATE:  02/03/2023, CONSULTATION DATE:  02/03/2023 REFERRING MD:  Dr. Lavada Stank, CHIEF COMPLAINT:  dyspnea, hypoxia   History of Present Illness:  This 78 year old Caucasian male is seen in consultation at the request of Dr. Lavada Stank for recommendations on further evaluation and management of hypoxia.  The patient was sent to [facility] emergency department from [facility] pulmonary clinic today, after he presented with reports of having profound hypoxia at home (down to SpO2 45%).  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  He is on rituximab  and steroids.  In fact, he was recently hospitalized for interstitial lung disease flareup and has been on a prednisone  taper, currently on prednisone  40 mg p.o. daily.  Treated with high-dose steroids.  Right heart cath on 01/04/2023 showed normal filling pressures, mild pulmonary hypertension and preserved cardiac output.  He reported doing well after his recent hospitalization and felt that he had a very good day yesterday.  However, this morning he woke up and experienced extreme exertional dyspnea while getting from his bed to the bathroom, when he noted that his SpO2 went down to 45%.  He is normally on supplemental oxygen  at 2-2.5 LPM via nasal cannula.  He does titrate as needed to maintain SpO2 90%.  However, he noted that despite turning his supplemental oxygen  up to 6 LPM (which he typically needs during ambulation), he was not recovering.  He denies cough.  He denies pleuritic pain.  He denies sputum production.  He denies chest pain.  He does state that when he was profoundly hypoxic he did notice that he was experiencing blurring of vision.  He has noted some modest increased swelling in his lower extremities.  He does confess that he is prescribed daily furosemide  but is inconsistent with adherence to  prescribed therapy.  Patient says he has had a very stressful and very active week, which revolves around having to get to multiple medical appointments.  He had an iron infusion a few days ago.  He also had a Rituxan  infusion earlier this week.  Patient says he has been having blood in his stools, although he says that this is not any more than usual.  This has been a chronic problem ever since his diagnosis of Wegener's granulomatosis.    He is on Coumadin  for chronic atrial fibrillation.  His last INR check (01/24/2023) was supratherapeutic, 6.7.  However, upon recheck in the emergency department, this has fallen into the therapeutic range, 2.7.  Last hemoglobin reported by patient was 8.2, currently 8.7.   Pertinent  Medical History  PAST MEDICAL history significant for Wegener's granulomatosis (diagnosed 2019, on Rituxan  and steroids), type 2 diabetes mellitus, OSA on CPAP, atrial fibrillation on coumadin , HFpEF (LVEF 65 in 12/28/2022, grade 1 diastolic dysfunction), pulmonary hypertension, coronary artery disease.  Significant Hospital Events: Including procedures, antibiotic start and stop dates in addition to other pertinent events   1/30 presented with shortness of breath and hypoxia to steroids started 1/31 states he feels well but is requiring 8 to 10 L high flow nasal cannula 2/2 no acute issues overnight, remains on 6 L high flow 2/5:  worsening oxygen  requirement. Subjectively feels more breathless. Started on cellcept  2/1. Sending procal. Lasix  today. Con't cellcept  and steroids and dapsone . Limited echo. Low threshold for starting antibiotics   Interim History / Subjective:  Not feeling better today. Sitting  OOB in chair eating lunch. He feels more breathless today. On more oxygen .   Objective   Blood pressure (!) 97/50, pulse 98, temperature 97.7 F (36.5 C), temperature source Oral, resp. rate 20, height 5' 10 (1.778 m), weight 91.5 kg, SpO2 92%.        Intake/Output  Summary (Last 24 hours) at 02/09/2023 1334 Last data filed at 02/09/2023 0802 Gross per 24 hour  Intake 120 ml  Output 730 ml  Net -610 ml   Filed Weights   02/03/23 1227 02/04/23 1906  Weight: 94.8 kg 91.5 kg    Examination: General: Acute on chronic ill-appearing elderly male, oob in chair, increased WOB HEENT: [place]/AT, MM pink/moist, PERRLA, high flow [place] 12L Neuro: Alert and oriented x 4, nonfocal CV: s1s2 regular rate and rhythm, no murmur, rubs, or gallops  PULM: tachypneic but not in extremis, on 12L high flow nasal cannula sat ~92%, rhonchi bilaterally  GI:  rounded and soft Extremities: warm/dry, no edema  Skin: no rashes or lesions   Resolved Hospital Problem list   Not applicable.  Assessment & Plan:  GPA in flare Hx afib on AC/tikosyn  Stopped rituxan  and started cellcept  on 2/1. Not improving on 2/5. Query overlying infection vs fluid overload in addition to flare.  P: - +procal  - + lasix  40mg  IV  - f/u echo  - con't high dose steroids and cellcept   - low threshold to start antibiotics  - con't dapsone  for PCP coverage  - wean o2 as able, currently up titrating  - aspiration precautions  - pulm rehab consult on dispo   Tinnie FORBES Furth, PA-C [facility] 02/09/23 2:46 PM  Please see [email or web address] for pager details.  From 7A-7P if no response, please call [phone] After hours, please call ELink [phone]

## 2023-02-09 NOTE — Inpatient Diabetes Management (Signed)
 Inpatient Diabetes Program Recommendations  AACE/ADA: New Consensus Statement on Inpatient Glycemic Control (2015)  Target Ranges:  Prepandial:   less than 140 mg/dL      Peak postprandial:   less than 180 mg/dL (1-2 hours)      Critically ill patients:  140 - 180 mg/dL   Lab Results  Component Value Date   GLUCAP 363 (H) 02/09/2023   HGBA1C 5.9 (H) 01/02/2023    Review of Glycemic Control  Latest Reference Range & Units 02/08/23 21:22 02/09/23 08:00 02/09/23 11:41  Glucose-Capillary 70 - 99 mg/dL 825 (H) 857 (H) 636 (H)   Diabetes history: DM 2 Outpatient Diabetes medications: Metformin  500 mg bid, Pred. Taper, Ozempic 0.5 mg weekly Current orders for Inpatient glycemic control:  Novolog  0-9 units tid with meals and HS Solumedrol 125 mg q 12 hours  Inpatient Diabetes Program Recommendations:    Consider increasing Novolog  correction to moderate (0-15 units) tid with meals and HS.   Thanks,  Randall Bullocks, RN, BC-ADM Inpatient Diabetes Coordinator Pager [phone]  (8a-5p)

## 2023-02-09 NOTE — Progress Notes (Signed)
 PROGRESS NOTE        PATIENT DETAILS Name: Gary [name] Age: 78 y.o. Sex: male Date of Birth: 05/09/1945 Admit Date: 02/03/2023 Admitting Physician Lavada MARLA Stank, MD ERE:Dxjxoz, Massie, DO  Brief Summary: Patient is a 78 y.o.  male with history of granulomatosis polyangiitis-ILD-chronic hypoxic respiratory failure on 3-4 L of oxygen  at home, CAD s/p PCI September 2023, A-fib on warfarin, chronic HFpEF-recent hospitalization for ILD flare-on tapering steroids-presented with shortness of breath-found to have acute hypoxic respiratory failure-requiring HFNC-secondary to ILD flare (while on prednisone  40 mg daily)   Significant events: 1/30>> admit to [facility]  Significant studies: 1/30>> CXR: Worsening right-sided infiltrates.  Significant microbiology data: 1/30>> respiratory virus panel: Negative 1/30>> COVID/influenza/RSV PCR: Negative  Procedures: None  Consults: PCCM  Subjective: Remains unchanged-no complaints except that he is short of breath with minimal activity.  Stable on anywhere from 5-8 L of oxygen .  Objective: Vitals: Blood pressure 95/68, pulse 86, temperature 97.6 F (36.4 C), temperature source Oral, resp. rate 18, height 5' 10 (1.778 m), weight 91.5 kg, SpO2 93%.   Exam: Gen Exam:Alert awake-not in any distress HEENT:atraumatic, normocephalic Chest: Bilateral rales up to mid lung from base. CVS:S1S2 regular Abdomen:soft non tender, non distended Extremities:no edema Neurology: Non focal Skin: no rash  Pertinent Labs/Radiology:    Latest Ref Rng & Units 02/08/2023    4:53 AM 02/07/2023    5:09 AM 02/06/2023    5:34 AM  CBC  WBC 4.0 - 10.5 K/uL 14.0  12.6  15.7   Hemoglobin 13.0 - 17.0 g/dL 9.3  8.4  8.8   Hematocrit 39.0 - 52.0 % 32.2  29.4  30.5   Platelets 150 - 400 K/uL 389  376  412     Lab Results  Component Value Date   NA 139 02/09/2023   K 4.1 02/09/2023   CL 101 02/09/2023   CO2 27 02/09/2023       Assessment/Plan: Acute on chronic hypoxic respiratory failure secondary to ILD flare-history of granulomatosis with polyangiitis (on home O2-anywhere from 3-4 L) Slowly improving-essentially unchanged for the past several days-requiring anywhere from 5-8 L of oxygen  Continues to be symptomatic with shortness of breath with minimal activity Continue steroids/CellCept  Pulmonary tolerating Mobilize with PT/OT/nursing staff PCCM following Continue attempts to slowly titrate down FiO2.   History of granulomatosis with polyangiitis/Wegener's granulomatosis Was on tapering steroids (down to 40 mg)-when he developed worsening respiratory failure Prophylactic dapsone  for PJP (no Bactrim  as interaction with Tikosyn ) PCCM has started CellCept -no longer on Rituxan  Currently on IV Solu-Medrol  CRP downtrending.  Paroxysmal atrial fibrillation Maintaining sinus rhythm Tikosyn /Coreg  Has been transitioned to Eliquis -no plans to restart Coumadin  on discharge.  Chronic HFpEF Euvolemic Resume oral furosemide -maintain negative balance as long as electrolytes tolerate.  History of CAD s/p PCI September 2023 Aspirin  No longer on Plavix -this was also not on his most recent discharge list of medications.  Iron deficiency anemia Appreciate GI input-plans are for outpatient endoscopy evaluation once his respiratory status improves No overt bleeding noted-Hb relatively stable for the past several days Given IV iron on 2/4-as he was scheduled to get IV iron as an outpatient. Follow CBC periodically.    Hypothyroidism Synthroid   DM-2 CBG stable on SSI  Recent Labs    02/08/23 1642 02/08/23 2122 02/09/23 0800  GLUCAP 224* 174* 142*    Mood disorder  Stable Continue Effexor    BMI: Estimated body mass index is 28.94 kg/m as calculated from the following:   Height as of this encounter: 5' 10 (1.778 m).   Weight as of this encounter: 91.5 kg.   Code status:   Code Status: Full Code    DVT Prophylaxis: SCDs Start: 02/03/23 1523 apixaban  (ELIQUIS ) tablet 5 mg    Family Communication:Spouse [name]-[phone] on 2/4   Disposition Plan: Status is: Inpatient Remains inpatient appropriate because: Severity of illness   Planned Discharge Destination:Home health   Diet: Diet Order             Diet regular Fluid consistency: Thin  Diet effective now                     Antimicrobial agents: Anti-infectives (From admission, onward)    Start     Dose/Rate Route Frequency Ordered Stop   02/03/23 1445  dapsone  tablet 100 mg        100 mg Oral Daily 02/03/23 1442          MEDICATIONS: Scheduled Meds:  ALPRAZolam   0.5 mg Oral QHS   apixaban   5 mg Oral BID   aspirin   81 mg Oral Daily   carvedilol   3.125 mg Oral BID WC   dapsone   100 mg Oral Daily   dofetilide   500 mcg Oral BID   feeding supplement  237 mL Oral BID BM   furosemide   40 mg Oral Daily   insulin  aspart  0-5 Units Subcutaneous QHS   insulin  aspart  0-9 Units Subcutaneous TID WC   levothyroxine   137 mcg Oral QAC breakfast   methylPREDNISolone  (SOLU-MEDROL ) injection  125 mg Intravenous Q12H   mycophenolate   500 mg Oral BID   pantoprazole   40 mg Oral BID   venlafaxine  XR  150 mg Oral Daily   Continuous Infusions:   PRN Meds:.acetaminophen  **OR** acetaminophen , bisacodyl , metoprolol  tartrate, nitroGLYCERIN , mouth rinse, promethazine    I have personally reviewed following labs and imaging studies  LABORATORY DATA: CBC: Recent Labs  Lab 02/04/23 0454 02/05/23 0531 02/06/23 0534 02/07/23 0509 02/08/23 0453  WBC 11.7* 14.5* 15.7* 12.6* 14.0*  NEUTROABS 10.4* 12.8* 13.8* 10.7* 12.4*  HGB 9.1* 8.6* 8.8* 8.4* 9.3*  HCT 32.5* 29.9* 30.5* 29.4* 32.2*  MCV 87.4 85.4 86.4 87.2 87.0  PLT 337 391 412* 376 389    Basic Metabolic Panel: Recent Labs  Lab 02/03/23 1443 02/04/23 0454 02/05/23 0531 02/06/23 0534 02/07/23 0509 02/08/23 0453 02/09/23 0440  NA  --  138 140 139 142  142 139  K  --  4.0 3.9 3.9 4.4 4.0 4.1  CL  --  104 102 104 107 102 101  CO2  --  22 23 25 25 27 27   GLUCOSE  --  160* 161* 150* 152* 148* 166*  BUN  --  23 28* 33* 28* 24* 22  CREATININE  --  0.89 1.13 1.03 1.01 0.84 0.88  CALCIUM   --  9.0 9.2 9.1 9.3 9.3 9.4  MG 2.3 2.2 2.4 2.3 2.3  --   --   PHOS  --  4.2 3.8 3.4 3.6  --   --     GFR: Estimated Creatinine Clearance: 77.4 mL/min (by C-G formula based on SCr of 0.88 mg/dL).  Liver Function Tests: Recent Labs  Lab 02/03/23 1304  AST 23  ALT 25  ALKPHOS 72  BILITOT 0.6  PROT 5.3*  ALBUMIN  3.0*   No results for  input(s): LIPASE, AMYLASE in the last 168 hours. No results for input(s): AMMONIA in the last 168 hours.  Coagulation Profile: Recent Labs  Lab 02/03/23 1304 02/04/23 0454 02/06/23 1033 02/07/23 0509  INR 2.7* 1.8* 1.2 1.2    Cardiac Enzymes: No results for input(s): CKTOTAL, CKMB, CKMBINDEX, TROPONINI in the last 168 hours.  BNP (last 3 results) Recent Labs    11/04/22 1519  PROBNP 416.0*    Lipid Profile: No results for input(s): CHOL, HDL, LDLCALC, TRIG, CHOLHDL, LDLDIRECT in the last 72 hours.  Thyroid  Function Tests: No results for input(s): TSH, T4TOTAL, FREET4, T3FREE, THYROIDAB in the last 72 hours.  Anemia Panel: No results for input(s): VITAMINB12, FOLATE, FERRITIN, TIBC, IRON, RETICCTPCT in the last 72 hours.   Urine analysis:    Component Value Date/Time   COLORURINE YELLOW 09/17/2022 2236   APPEARANCEUR HAZY (A) 09/17/2022 2236   LABSPEC 1.010 09/17/2022 2236   PHURINE 6.5 09/17/2022 2236   GLUCOSEU NEGATIVE 09/17/2022 2236   HGBUR NEGATIVE 09/17/2022 2236   BILIRUBINUR NEGATIVE 09/17/2022 2236   KETONESUR NEGATIVE 09/17/2022 2236   PROTEINUR NEGATIVE 09/17/2022 2236   UROBILINOGEN 0.2 12/24/2011 0805   NITRITE NEGATIVE 09/17/2022 2236   LEUKOCYTESUR SMALL (A) 09/17/2022 2236    Sepsis Labs: Lactic Acid, Venous No results  found for: LATICACIDVEN  MICROBIOLOGY: Recent Results (from the past 240 hours)  Resp panel by RT-PCR (RSV, Flu A&B, Covid) Anterior Nasal Swab     Status: None   Collection Time: 02/03/23 12:16 PM   Specimen: Anterior Nasal Swab  Result Value Ref Range Status   SARS Coronavirus 2 by RT PCR NEGATIVE NEGATIVE Final   Influenza A by PCR NEGATIVE NEGATIVE Final   Influenza B by PCR NEGATIVE NEGATIVE Final    Comment: (NOTE) The Xpert Xpress SARS-CoV-2/FLU/RSV plus assay is intended as an aid in the diagnosis of influenza from Nasopharyngeal swab specimens and should not be used as a sole basis for treatment. Nasal washings and aspirates are unacceptable for Xpert Xpress SARS-CoV-2/FLU/RSV testing.  Fact Sheet for Patients: bloggercourse.com  Fact Sheet for Healthcare Providers: seriousbroker.it  This test is not yet approved or cleared by the [place]  FDA and has been authorized for detection and/or diagnosis of SARS-CoV-2 by FDA under an Emergency Use Authorization (EUA). This EUA will remain in effect (meaning this test can be used) for the duration of the COVID-19 declaration under Section 564(b)(1) of the Act, 21 U.S.C. section 360bbb-3(b)(1), unless the authorization is terminated or revoked.     Resp Syncytial Virus by PCR NEGATIVE NEGATIVE Final    Comment: (NOTE) Fact Sheet for Patients: bloggercourse.com  Fact Sheet for Healthcare Providers: seriousbroker.it  This test is not yet approved or cleared by the [place]  FDA and has been authorized for detection and/or diagnosis of SARS-CoV-2 by FDA under an Emergency Use Authorization (EUA). This EUA will remain in effect (meaning this test can be used) for the duration of the COVID-19 declaration under Section 564(b)(1) of the Act, 21 U.S.C. section 360bbb-3(b)(1), unless the authorization is terminated  or revoked.  Performed at [facility], [address] [place], [place] [address]   MRSA Next Gen by PCR, Nasal     Status: None   Collection Time: 02/03/23  2:44 PM   Specimen: Nasal Mucosa; Nasal Swab  Result Value Ref Range Status   MRSA by PCR Next Gen NOT DETECTED NOT DETECTED Final    Comment: (NOTE) The GeneXpert MRSA Assay (FDA approved for  NASAL specimens only), is one component of a comprehensive MRSA colonization surveillance program. It is not intended to diagnose MRSA infection nor to guide or monitor treatment for MRSA infections. Test performance is not FDA approved in patients less than 51 years old. Performed at [facility], [address] [place], [place] [address]   Respiratory (~20 pathogens) panel by PCR     Status: None   Collection Time: 02/03/23  3:22 PM   Specimen: Nasopharyngeal Swab; Respiratory  Result Value Ref Range Status   Adenovirus NOT DETECTED NOT DETECTED Final   Coronavirus 229E NOT DETECTED NOT DETECTED Final    Comment: (NOTE) The Coronavirus on the Respiratory Panel, DOES NOT test for the novel  Coronavirus (2019 nCoV)    Coronavirus HKU1 NOT DETECTED NOT DETECTED Final   Coronavirus NL63 NOT DETECTED NOT DETECTED Final   Coronavirus OC43 NOT DETECTED NOT DETECTED Final   Metapneumovirus NOT DETECTED NOT DETECTED Final   Rhinovirus / Enterovirus NOT DETECTED NOT DETECTED Final   Influenza A NOT DETECTED NOT DETECTED Final   Influenza B NOT DETECTED NOT DETECTED Final   Parainfluenza Virus 1 NOT DETECTED NOT DETECTED Final   Parainfluenza Virus 2 NOT DETECTED NOT DETECTED Final   Parainfluenza Virus 3 NOT DETECTED NOT DETECTED Final   Parainfluenza Virus 4 NOT DETECTED NOT DETECTED Final   Respiratory Syncytial Virus NOT DETECTED NOT DETECTED Final   Bordetella pertussis NOT DETECTED NOT DETECTED Final   Bordetella Parapertussis NOT DETECTED NOT DETECTED Final   Chlamydophila pneumoniae NOT DETECTED NOT DETECTED Final    Mycoplasma pneumoniae NOT DETECTED NOT DETECTED Final    Comment: Performed at [facility], [address] [place], [place] [address]    RADIOLOGY STUDIES/RESULTS: DG Chest Port 1 View Result Date: 02/09/2023 CLINICAL DATA:  Respiratory failure. EXAM: PORTABLE CHEST 1 VIEW COMPARISON:  02/03/2023 FINDINGS: Low volume film. The cardio pericardial silhouette is enlarged. Diffuse interstitial opacity again noted, progressive in the parahilar left mid lung. No substantial pleural effusion. No acute bony abnormality. Telemetry leads overlie the chest. IMPRESSION: Low volume film with progressive interstitial opacity in the parahilar left mid lung. Imaging features could be related to asymmetric edema or infection. Electronically Signed   By: Camellia Candle M.D.   On: 02/09/2023 08:54     LOS: 6 days   Donalda Applebaum, MD  [facility]    To contact the attending provider between 7A-7P or the covering provider during after hours 7P-7A, please log into the web site [email or web address] and access using universal [place] password for that web site. If you do not have the password, please call the hospital operator.  02/09/2023, 11:10 AM

## 2023-02-10 ENCOUNTER — Other Ambulatory Visit (HOSPITAL_COMMUNITY): Payer: Medicare Other

## 2023-02-10 ENCOUNTER — Inpatient Hospital Stay (HOSPITAL_COMMUNITY): Payer: Medicare Other

## 2023-02-10 DIAGNOSIS — I5032 Chronic diastolic (congestive) heart failure: Secondary | ICD-10-CM | POA: Diagnosis not present

## 2023-02-10 DIAGNOSIS — Q211 Atrial septal defect, unspecified: Secondary | ICD-10-CM | POA: Diagnosis not present

## 2023-02-10 DIAGNOSIS — J849 Interstitial pulmonary disease, unspecified: Secondary | ICD-10-CM | POA: Diagnosis not present

## 2023-02-10 DIAGNOSIS — E46 Unspecified protein-calorie malnutrition: Secondary | ICD-10-CM

## 2023-02-10 DIAGNOSIS — J9621 Acute and chronic respiratory failure with hypoxia: Secondary | ICD-10-CM | POA: Diagnosis not present

## 2023-02-10 LAB — GLUCOSE, CAPILLARY
Glucose-Capillary: 267 mg/dL — ABNORMAL HIGH (ref 70–99)
Glucose-Capillary: 359 mg/dL — ABNORMAL HIGH (ref 70–99)
Glucose-Capillary: 247 mg/dL — ABNORMAL HIGH (ref 70–99)
Glucose-Capillary: 205 mg/dL — ABNORMAL HIGH (ref 70–99)

## 2023-02-10 LAB — CBC
HCT: 26.6 % — ABNORMAL LOW (ref 39.0–52.0)
Hemoglobin: 7.7 g/dL — ABNORMAL LOW (ref 13.0–17.0)
MCH: 25.5 pg — ABNORMAL LOW (ref 26.0–34.0)
MCHC: 28.9 g/dL — ABNORMAL LOW (ref 30.0–36.0)
MCV: 88.1 fL (ref 80.0–100.0)
Platelets: 302 10*3/uL (ref 150–400)
RBC: 3.02 MIL/uL — ABNORMAL LOW (ref 4.22–5.81)
RDW: 25.2 % — ABNORMAL HIGH (ref 11.5–15.5)
WBC: 10.2 10*3/uL (ref 4.0–10.5)
nRBC: 2 % — ABNORMAL HIGH (ref 0.0–0.2)

## 2023-02-10 LAB — BASIC METABOLIC PANEL
Anion gap: 15 (ref 5–15)
BUN: 42 mg/dL — ABNORMAL HIGH (ref 8–23)
CO2: 24 mmol/L (ref 22–32)
Calcium: 9.7 mg/dL (ref 8.9–10.3)
Chloride: 101 mmol/L (ref 98–111)
Creatinine, Ser: 1.05 mg/dL (ref 0.61–1.24)
GFR, Estimated: >60 mL/min (ref >60)
Glucose, Bld: 309 mg/dL — ABNORMAL HIGH (ref 70–99)
Potassium: 3.6 mmol/L (ref 3.5–5.1)
Sodium: 140 mmol/L (ref 135–145)

## 2023-02-10 LAB — ECHOCARDIOGRAM LIMITED BUBBLE STUDY

## 2023-02-10 MED ORDER — INSULIN ASPART 100 UNIT/ML IJ SOLN
0.0000 [IU] | Freq: Three times a day (TID) | INTRAMUSCULAR | Status: DC
  Administered 2023-02-10: 7 [IU] via SUBCUTANEOUS
  Administered 2023-02-10: 20 [IU] via SUBCUTANEOUS
  Administered 2023-02-11: 15 [IU] via SUBCUTANEOUS
  Administered 2023-02-11: 20 [IU] via SUBCUTANEOUS
  Administered 2023-02-12: 15 [IU] via SUBCUTANEOUS
  Administered 2023-02-12: 7 [IU] via SUBCUTANEOUS

## 2023-02-10 MED ORDER — POTASSIUM CHLORIDE CRYS ER 20 MEQ PO TBCR
40.0000 meq | EXTENDED_RELEASE_TABLET | Freq: Once | ORAL | Status: AC
  Administered 2023-02-10: 40 meq via ORAL
  Filled 2023-02-10: qty 2

## 2023-02-10 MED ORDER — INSULIN GLARGINE-YFGN 100 UNIT/ML ~~LOC~~ SOLN
10.0000 [IU] | Freq: Every day | SUBCUTANEOUS | Status: DC
  Administered 2023-02-10: 10 [IU] via SUBCUTANEOUS
  Filled 2023-02-10 (×2): qty 0.1

## 2023-02-10 NOTE — Progress Notes (Signed)
 Occupational Therapy Treatment Patient Details Name: Gary [name] MRN: [id number] DOB: 27-May-1945 Today's Date: 02/10/2023   History of present illness Pt is 78 yo male admitted on 1/30/425 with acute on chronic resp failure with hypoxia.  Pt with hx including but not limited to Wegener's granulomatosis, ILD, former smoker, CAD/CABG, afib, CHF, DM2   OT comments  Pt refused mobility today, c/o being too SOB. Pt Sp02 stable at rest but upon talking the pt was observed to become increasingly SOB with more RR. Educated pt on energy conservation and had pt demonstrate a smooth and controlled rate of pursed lip breathing. Pt may benefit from IS and flutter valve use, messaged MD to confirm that we could procure one for pt. OT to continue to progress pt as able. DC plans okay for HH at this time, will be reassessed if pt continues to decline efforts of OOB mobility tolerance.       If plan is discharge home, recommend the following:  A little help with walking and/or transfers;A lot of help with bathing/dressing/bathroom;Assistance with cooking/housework;Help with stairs or ramp for entrance;Assist for transportation   Equipment Recommendations  None recommended by OT    Recommendations for Other Services      Precautions / Restrictions Precautions Precautions: Other (comment) Precaution Comments: watch sats Restrictions Weight Bearing Restrictions Per Provider Order: No       Mobility Bed Mobility               General bed mobility comments: Pt in recliner throughout session    Transfers                   General transfer comment: Pt declines due to shortness of breath     Balance                                           ADL either performed or assessed with clinical judgement   ADL                                         General ADL Comments: Pt declined mobility secondary to SOB. Edcuated pt on the 5p's of energy  conservation and messaged MD regarding the potential benefits to have order for IS and flutter valve use to help with chest congestion    Extremity/Trunk Assessment              Vision       Perception     Praxis      Cognition Arousal: Alert Behavior During Therapy: WFL for tasks assessed/performed Overall Cognitive Status: Within Functional Limits for tasks assessed                                          Exercises      Shoulder Instructions       General Comments Pt on 6L HFNC at rest, Sp02 95% at rest, pt observed to be showing signs of SOB when talking    Pertinent Vitals/ Pain       Pain Assessment Pain Assessment: No/denies pain  Home Living  Prior Functioning/Environment              Frequency  Min 1X/week        Progress Toward Goals  OT Goals(current goals can now be found in the care plan section)  Progress towards OT goals: Progressing toward goals  Acute Rehab OT Goals OT Goal Formulation: With patient Time For Goal Achievement: 02/21/23 Potential to Achieve Goals: Good  Plan      Co-evaluation                 AM-PAC OT 6 Clicks Daily Activity     Outcome Measure   Help from another person eating meals?: A Little Help from another person taking care of personal grooming?: A Little Help from another person toileting, which includes using toliet, bedpan, or urinal?: A Lot Help from another person bathing (including washing, rinsing, drying)?: A Lot Help from another person to put on and taking off regular upper body clothing?: A Little Help from another person to put on and taking off regular lower body clothing?: A Lot 6 Click Score: 15    End of Session Equipment Utilized During Treatment: Oxygen   OT Visit Diagnosis: Unsteadiness on feet (R26.81);Other abnormalities of gait and mobility (R26.89);Muscle weakness (generalized) (M62.81)    Activity Tolerance Treatment limited secondary to medical complications (Comment)   Patient Left in chair;with call bell/phone within reach;with family/visitor present   Nurse Communication Mobility status        Time: 1450-1458 OT Time Calculation (min): 8 min  Charges: OT General Charges $OT Visit: 1 Visit OT Evaluation $OT Eval Low Complexity: 1 Low  02/10/2023  AB, OTR/L  Acute Rehabilitation Services  Office: [phone]   Gary [name] 02/10/2023, 4:36 PM

## 2023-02-10 NOTE — Progress Notes (Addendum)
 02/10/2023   I have seen and evaluated the patient for GPA ILD flare   S:  Feels better this am, labs pending   O: Blood pressure 96/66, pulse 64, temperature 97.6 F (36.4 C), temperature source Oral, resp. rate 17, height 5' 3 (1.6 m), weight 81.6 kg, SpO2 94 %.   No distress Minimal edema L>R crackles   A:  ILD flare Likely complicated by relative protein calorie malnutrition and third spacing +/- additional hydrostatic edema R/o intracardiac shunt   P:  - Steroids, cellcept  - Wean o2 for sats > 90% - Encourage nutrition - Echo/bubble - Push diuresis as tolerated by renal function - He took a week last time to get better, may be similar path this time     Rolan Sharps MD [facility] Prefer epic messenger for cross cover needs If after hours, please call E-link

## 2023-02-10 NOTE — Progress Notes (Signed)
 Ok to give kcl 40meq x1 to keep k>4 per Dr. Hilton Lucky.  Ivery Marking, PharmD, BCIDP, AAHIVP, CPP Infectious Disease Pharmacist 02/10/2023 1:06 PM

## 2023-02-10 NOTE — Inpatient Diabetes Management (Signed)
 Inpatient Diabetes Program Recommendations  AACE/ADA: New Consensus Statement on Inpatient Glycemic Control (2015)  Target Ranges:  Prepandial:   less than 140 mg/dL      Peak postprandial:   less than 180 mg/dL (1-2 hours)      Critically ill patients:  140 - 180 mg/dL   Lab Results  Component Value Date   GLUCAP 267 (H) 02/10/2023   HGBA1C 5.9 (H) 01/02/2023    Latest Reference Range & Units 02/09/23 08:00 02/09/23 11:41 02/09/23 16:11 02/09/23 20:23 02/10/23 08:17  Glucose-Capillary 70 - 99 mg/dL 857 (H) 636 (H) 647 (H) 320 (H) 267 (H)  (H): Data is abnormally high.jeh  Diabetes history: DM 2 Outpatient Diabetes medications: Metformin  500 mg bid, Pred. Taper, Ozempic 0.5 mg weekly Current orders for Inpatient glycemic control:  Novolog  0-9 units tid with meals and HS Solumedrol 125 mg q 12 hours   Inpatient Diabetes Program Recommendations:     Please Consider while on steroids: -Increase Novolog  correction to moderate (0-15 units) tid with meals and HS.   Thank you, Phoebie Shad E. Kriss Perleberg, RN, MSN, CDCES  Diabetes Coordinator Inpatient Glycemic Control Team Team Pager [phone] (8am-5pm) 02/10/2023 9:05 AM

## 2023-02-10 NOTE — Progress Notes (Signed)
  Echocardiogram 2D Echocardiogram has been performed.  Gary [name] Gary [name] 02/10/2023, 8:39 AM

## 2023-02-10 NOTE — Plan of Care (Signed)

## 2023-02-10 NOTE — Plan of Care (Signed)

## 2023-02-10 NOTE — Progress Notes (Signed)
 Physical Therapy Treatment Patient Details Name: Gary [name] MRN: [id number] DOB: 19-Jan-1945 Today's Date: 02/10/2023   History of Present Illness Pt is 78 yo male admitted on 1/30/425 with acute on chronic resp failure with hypoxia.  Pt with hx including but not limited to Wegener's granulomatosis, ILD, former smoker, CAD/CABG, afib, CHF, DM2    PT Comments  Pt received sitting in the chair and noticeably short of breath at rest. Pt reports feeling that shortness of breath has worsened since admission. Pt declines standing trials, but is agreeable to BLE exercises. Pt able to perform exercises with frequent rest breaks and cues for pursed lip breathing. SpO2 noted to drop to 88% with seated marches with pt sitting without back support for increased core activation. Pt continues to benefit from PT services to progress toward functional mobility goals.     If plan is discharge home, recommend the following: Assistance with cooking/housework;Assist for transportation;Help with stairs or ramp for entrance   Can travel by private vehicle        Equipment Recommendations  None recommended by PT    Recommendations for Other Services       Precautions / Restrictions Precautions Precautions: Other (comment) Precaution Comments: watch sats Restrictions Weight Bearing Restrictions Per Provider Order: No     Mobility  Bed Mobility               General bed mobility comments: Pt in recliner at beginning and end of session, however reports need for assist for bed mobility    Transfers                   General transfer comment: Pt declines due to shortness of breath       Balance Overall balance assessment: Mild deficits observed, not formally tested                                          Cognition Arousal: Alert Behavior During Therapy: WFL for tasks assessed/performed Overall Cognitive Status: Within Functional Limits for tasks assessed                                           Exercises General Exercises - Lower Extremity Ankle Circles/Pumps: AROM, Seated, Both, 10 reps Long Arc Quad: AROM, Seated, Both, 10 reps Straight Leg Raises: AROM, Seated, Both, 15 reps Hip Flexion/Marching: AROM, Seated, Both, 10 reps    General Comments General comments (skin integrity, edema, etc.): Pt on 6L throughout session with SpO2 remaining >88% when pleth reliable, however pt demonstrating significant shortness of breath requiring frequent rest breaks and increased time for recovery      Pertinent Vitals/Pain Pain Assessment Pain Assessment: No/denies pain     PT Goals (current goals can now be found in the care plan section) Acute Rehab PT Goals Patient Stated Goal: home PT Goal Formulation: With patient Time For Goal Achievement: 02/18/23 Progress towards PT goals: Not progressing toward goals - comment (limited by SpO2 and shortness of breath)    Frequency    Min 1X/week       AM-PAC PT 6 Clicks Mobility   Outcome Measure  Help needed turning from your back to your side while in a flat bed without using bedrails?: None Help needed moving from lying  on your back to sitting on the side of a flat bed without using bedrails?: None Help needed moving to and from a bed to a chair (including a wheelchair)?: A Little Help needed standing up from a chair using your arms (e.g., wheelchair or bedside chair)?: A Little Help needed to walk in hospital room?: A Little Help needed climbing 3-5 steps with a railing? : A Little 6 Click Score: 20    End of Session Equipment Utilized During Treatment: Oxygen  Activity Tolerance: Patient limited by fatigue;Other (comment) (shortness of breath) Patient left: with call bell/phone within reach;in chair Nurse Communication: Mobility status PT Visit Diagnosis: Difficulty in walking, not elsewhere classified (R26.2)     Time: 9091-9076 PT Time Calculation (min) (ACUTE  ONLY): 15 min  Charges:    $Therapeutic Exercise: 8-22 mins PT General Charges $$ ACUTE PT VISIT: 1 Visit                     Gary [name], PTA Acute Rehabilitation Services Secure Chat Preferred  [phone]    Gary [name] 02/10/2023, 10:14 AM

## 2023-02-10 NOTE — Progress Notes (Addendum)
 PROGRESS NOTE        PATIENT DETAILS Name: Gary [name] Age: 78 y.o. Sex: male Date of Birth: 1945-02-12 Admit Date: 02/03/2023 Admitting Physician Lavada MARLA Stank, MD ERE:Dxjxoz, Massie, DO  Brief Summary: Patient is a 78 y.o.  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  Significant microbiology data: 1/30>> respiratory virus panel: Negative 1/30>> COVID/influenza/RSV PCR: Negative  Procedures: None  Consults: PCCM  Subjective: Essentially unchanged-continues to complain of shortness of breath with minimal activity.  Objective: Vitals: Blood pressure 132/62, pulse 73, temperature 98 F (36.7 C), temperature source Oral, resp. rate 20, height 5' 10 (1.778 m), weight 91.5 kg, SpO2 97%.   Exam: Gen Exam:Alert awake-not in any distress HEENT:atraumatic, normocephalic Chest: Bilateral rales up to mid lung from base. CVS:S1S2 regular Abdomen:soft non tender, non distended Extremities:no edema Neurology: Non focal Skin: no rash  Pertinent Labs/Radiology:    Latest Ref Rng & Units 02/10/2023    4:50 AM 02/08/2023    4:53 AM 02/07/2023    5:09 AM  CBC  WBC 4.0 - 10.5 K/uL 10.2  14.0  12.6   Hemoglobin 13.0 - 17.0 g/dL 7.7  9.3  8.4   Hematocrit 39.0 - 52.0 % 26.6  32.2  29.4   Platelets 150 - 400 K/uL 302  389  376     Lab Results  Component Value Date   NA 140 02/10/2023   K 3.6 02/10/2023   CL 101 02/10/2023   CO2 24 02/10/2023      Assessment/Plan: Acute on chronic hypoxic  respiratory failure secondary to GPA associated ILD (on home O2-anywhere from 3-4 L) Essentially unchanged for the past several days-waxing/waning oxygen  requirements-anywhere from 6-12 L  PCCM following and directing care Remains on CellCept /steroids Diuretics as needed to maintain negative balance-no obvious signs of volume overload on exam Echo pending Continue to mobilize Pulmonary toileting with incentive spirometry/flutter valve Attempt to slowly titrate down FiO2.  History of granulomatosis with polyangiitis (GPA) Was on tapering steroids (down to 40 mg)-when he developed worsening respiratory failure Prophylactic dapsone  for PJP (no Bactrim  as interaction with Tikosyn ) PCCM has started CellCept -no longer on Rituxan  Currently on IV Solu-Medrol  CRP downtrending.  Paroxysmal atrial fibrillation Maintaining sinus rhythm Tikosyn /Coreg  Has been transitioned to Eliquis -no plans to restart Coumadin  on discharge.  Chronic HFpEF As needed IV diuretics to maintain negative balance-currently euvolemic.  History of CAD s/p PCI September 2023 Aspirin  No longer on Plavix -this was also not on his most recent discharge list of medications.  Iron deficiency anemia Appreciate GI input-plans are for outpatient endoscopy evaluation once his respiratory status improves-No overt bleeding noted-some drop in Hb but no obvious evidence of GI bleed.  Given IV iron in 2/4-repeat CBC tomorrow.   Hypothyroidism Synthroid   DM-2 with steroid related hyperglycemia CBGs remain uncontrolled Start Semglee  10 units daily Change SSI to resistant scale  Recent Labs    02/09/23 1611 02/09/23 2023 02/10/23 0817  GLUCAP 352* 320* 267*    Mood disorder Stable Continue Effexor   BMI: Estimated body mass index is 28.94 kg/m as calculated from the following:   Height as of this encounter: 5' 10 (1.778 m).   Weight as of this encounter: 91.5 kg.   Code status:   Code Status: Full Code   DVT  Prophylaxis: SCDs Start: 02/03/23 1523 apixaban  (ELIQUIS ) tablet 5 mg    Family Communication:Spouse [name]-[phone] on 2/4   Disposition Plan: Status is: Inpatient Remains inpatient appropriate because: Severity of illness   Planned Discharge Destination:Home health   Diet: Diet Order             Diet regular Fluid consistency: Thin  Diet effective now                     Antimicrobial agents: Anti-infectives (From admission, onward)    Start     Dose/Rate Route Frequency Ordered Stop   02/03/23 1445  dapsone  tablet 100 mg        100 mg Oral Daily 02/03/23 1442          MEDICATIONS: Scheduled Meds:  ALPRAZolam   0.5 mg Oral QHS   apixaban   5 mg Oral BID   aspirin   81 mg Oral Daily   carvedilol   3.125 mg Oral BID WC   dapsone   100 mg Oral Daily   dofetilide   500 mcg Oral BID   dronabinol   5 mg Oral QAC lunch   feeding supplement  237 mL Oral BID BM   insulin  aspart  0-5 Units Subcutaneous QHS   insulin  aspart  0-9 Units Subcutaneous TID WC   levothyroxine   137 mcg Oral QAC breakfast   methylPREDNISolone  (SOLU-MEDROL ) injection  125 mg Intravenous Q12H   mycophenolate   500 mg Oral BID   pantoprazole   40 mg Oral BID   venlafaxine  XR  150 mg Oral Daily   Continuous Infusions:   PRN Meds:.acetaminophen  **OR** acetaminophen , bisacodyl , metoprolol  tartrate, nitroGLYCERIN , mouth rinse, promethazine    I have personally reviewed following labs and imaging studies  LABORATORY DATA: CBC: Recent Labs  Lab 02/04/23 0454 02/05/23 0531 02/06/23 0534 02/07/23 0509 02/08/23 0453 02/10/23 0450  WBC 11.7* 14.5* 15.7* 12.6* 14.0* 10.2  NEUTROABS 10.4* 12.8* 13.8* 10.7* 12.4*  --   HGB 9.1* 8.6* 8.8* 8.4* 9.3* 7.7*  HCT 32.5* 29.9* 30.5* 29.4* 32.2* 26.6*  MCV 87.4 85.4 86.4 87.2 87.0 88.1  PLT 337 391 412* 376 389 302    Basic Metabolic Panel: Recent Labs  Lab 02/03/23 1443 02/04/23 0454 02/05/23 0531 02/06/23 0534 02/07/23 0509 02/08/23 0453  02/09/23 0440 02/10/23 0856  NA  --  138 140 139 142 142 139 140  K  --  4.0 3.9 3.9 4.4 4.0 4.1 3.6  CL  --  104 102 104 107 102 101 101  CO2  --  22 23 25 25 27 27 24   GLUCOSE  --  160* 161* 150* 152* 148* 166* 309*  BUN  --  23 28* 33* 28* 24* 22 42*  CREATININE  --  0.89 1.13 1.03 1.01 0.84 0.88 1.05  CALCIUM   --  9.0 9.2 9.1 9.3 9.3 9.4 9.7  MG 2.3 2.2 2.4 2.3 2.3  --   --   --   PHOS  --  4.2 3.8 3.4 3.6  --   --   --     GFR: Estimated Creatinine Clearance: 64.9 mL/min (by C-G formula based on SCr of 1.05 mg/dL).  Liver Function Tests: Recent Labs  Lab 02/03/23 1304  AST 23  ALT 25  ALKPHOS 72  BILITOT 0.6  PROT 5.3*  ALBUMIN  3.0*   No results for input(s): LIPASE, AMYLASE in the last 168 hours. No results for input(s): AMMONIA in the last 168 hours.  Coagulation Profile: Recent Labs  Lab 02/03/23 1304 02/04/23 0454 02/06/23 1033 02/07/23 0509  INR 2.7* 1.8* 1.2 1.2    Cardiac Enzymes: No results for input(s): CKTOTAL, CKMB, CKMBINDEX, TROPONINI in the last 168 hours.  BNP (last 3 results) Recent Labs    11/04/22 1519  PROBNP 416.0*    Lipid Profile: No results for input(s): CHOL, HDL, LDLCALC, TRIG, CHOLHDL, LDLDIRECT in the last 72 hours.  Thyroid  Function Tests: No results for input(s): TSH, T4TOTAL, FREET4, T3FREE, THYROIDAB in the last 72 hours.  Anemia Panel: No results for input(s): VITAMINB12, FOLATE, FERRITIN, TIBC, IRON, RETICCTPCT in the last 72 hours.   Urine analysis:    Component Value Date/Time   COLORURINE YELLOW 09/17/2022 2236   APPEARANCEUR HAZY (A) 09/17/2022 2236   LABSPEC 1.010 09/17/2022 2236   PHURINE 6.5 09/17/2022 2236   GLUCOSEU NEGATIVE 09/17/2022 2236   HGBUR NEGATIVE 09/17/2022 2236   BILIRUBINUR NEGATIVE 09/17/2022 2236   KETONESUR NEGATIVE 09/17/2022 2236   PROTEINUR NEGATIVE 09/17/2022 2236   UROBILINOGEN 0.2 12/24/2011 0805   NITRITE NEGATIVE 09/17/2022  2236   LEUKOCYTESUR SMALL (A) 09/17/2022 2236    Sepsis Labs: Lactic Acid, Venous No results found for: LATICACIDVEN  MICROBIOLOGY: Recent Results (from the past 240 hours)  Resp panel by RT-PCR (RSV, Flu A&B, Covid) Anterior Nasal Swab     Status: None   Collection Time: 02/03/23 12:16 PM   Specimen: Anterior Nasal Swab  Result Value Ref Range Status   SARS Coronavirus 2 by RT PCR NEGATIVE NEGATIVE Final   Influenza A by PCR NEGATIVE NEGATIVE Final   Influenza B by PCR NEGATIVE NEGATIVE Final    Comment: (NOTE) The Xpert Xpress SARS-CoV-2/FLU/RSV plus assay is intended as an aid in the diagnosis of influenza from Nasopharyngeal swab specimens and should not be used as a sole basis for treatment. Nasal washings and aspirates are unacceptable for Xpert Xpress SARS-CoV-2/FLU/RSV testing.  Fact Sheet for Patients: bloggercourse.com  Fact Sheet for Healthcare Providers: seriousbroker.it  This test is not yet approved or cleared by the [place]  FDA and has been authorized for detection and/or diagnosis of SARS-CoV-2 by FDA under an Emergency Use Authorization (EUA). This EUA will remain in effect (meaning this test can be used) for the duration of the COVID-19 declaration under Section 564(b)(1) of the Act, 21 U.S.C. section 360bbb-3(b)(1), unless the authorization is terminated or revoked.     Resp Syncytial Virus by PCR NEGATIVE NEGATIVE Final    Comment: (NOTE) Fact Sheet for Patients: bloggercourse.com  Fact Sheet for Healthcare Providers: seriousbroker.it  This test is not yet approved or cleared by the [place]  FDA and has been authorized for detection and/or diagnosis of SARS-CoV-2 by FDA under an Emergency Use Authorization (EUA). This EUA will remain in effect (meaning this test can be used) for the duration of the COVID-19 declaration under Section  564(b)(1) of the Act, 21 U.S.C. section 360bbb-3(b)(1), unless the authorization is terminated or revoked.  Performed at [facility], [address], [place], [place] [address]   MRSA Next Gen by PCR, Nasal     Status: None   Collection Time: 02/03/23  2:44 PM   Specimen: Nasal Mucosa; Nasal Swab  Result Value Ref Range Status  MRSA by PCR Next Gen NOT DETECTED NOT DETECTED Final    Comment: (NOTE) The GeneXpert MRSA Assay (FDA approved for NASAL specimens only), is one component of a comprehensive MRSA colonization surveillance program. It is not intended to diagnose MRSA infection nor to guide or monitor treatment for MRSA infections. Test performance is not FDA approved in patients less than 32 years old. Performed at [facility], [address] [place], [place] [address]   Respiratory (~20 pathogens) panel by PCR     Status: None   Collection Time: 02/03/23  3:22 PM   Specimen: Nasopharyngeal Swab; Respiratory  Result Value Ref Range Status   Adenovirus NOT DETECTED NOT DETECTED Final   Coronavirus 229E NOT DETECTED NOT DETECTED Final    Comment: (NOTE) The Coronavirus on the Respiratory Panel, DOES NOT test for the novel  Coronavirus (2019 nCoV)    Coronavirus HKU1 NOT DETECTED NOT DETECTED Final   Coronavirus NL63 NOT DETECTED NOT DETECTED Final   Coronavirus OC43 NOT DETECTED NOT DETECTED Final   Metapneumovirus NOT DETECTED NOT DETECTED Final   Rhinovirus / Enterovirus NOT DETECTED NOT DETECTED Final   Influenza A NOT DETECTED NOT DETECTED Final   Influenza B NOT DETECTED NOT DETECTED Final   Parainfluenza Virus 1 NOT DETECTED NOT DETECTED Final   Parainfluenza Virus 2 NOT DETECTED NOT DETECTED Final   Parainfluenza Virus 3 NOT DETECTED NOT DETECTED Final   Parainfluenza Virus 4 NOT DETECTED NOT DETECTED Final   Respiratory Syncytial Virus NOT DETECTED NOT DETECTED Final   Bordetella pertussis NOT DETECTED NOT DETECTED Final   Bordetella  Parapertussis NOT DETECTED NOT DETECTED Final   Chlamydophila pneumoniae NOT DETECTED NOT DETECTED Final   Mycoplasma pneumoniae NOT DETECTED NOT DETECTED Final    Comment: Performed at [facility], [address], [place], [place] [address]    RADIOLOGY STUDIES/RESULTS: DG Chest Port 1 View Result Date: 02/09/2023 CLINICAL DATA:  Respiratory failure. EXAM: PORTABLE CHEST 1 VIEW COMPARISON:  02/03/2023 FINDINGS: Low volume film. The cardio pericardial silhouette is enlarged. Diffuse interstitial opacity again noted, progressive in the parahilar left mid lung. No substantial pleural effusion. No acute bony abnormality. Telemetry leads overlie the chest. IMPRESSION: Low volume film with progressive interstitial opacity in the parahilar left mid lung. Imaging features could be related to asymmetric edema or infection. Electronically Signed   By: Camellia Candle M.D.   On: 02/09/2023 08:54     LOS: 7 days   Donalda Applebaum, MD  [facility]    To contact the attending provider between 7A-7P or the covering provider during after hours 7P-7A, please log into the web site [email or web address] and access using universal [place] password for that web site. If you do not have the password, please call the hospital operator.  02/10/2023, 10:10 AM

## 2023-02-11 DIAGNOSIS — J849 Interstitial pulmonary disease, unspecified: Secondary | ICD-10-CM | POA: Diagnosis not present

## 2023-02-11 DIAGNOSIS — J9621 Acute and chronic respiratory failure with hypoxia: Secondary | ICD-10-CM | POA: Diagnosis not present

## 2023-02-11 DIAGNOSIS — I5032 Chronic diastolic (congestive) heart failure: Secondary | ICD-10-CM | POA: Diagnosis not present

## 2023-02-11 LAB — BASIC METABOLIC PANEL
Anion gap: 10 (ref 5–15)
BUN: 40 mg/dL — ABNORMAL HIGH (ref 8–23)
CO2: 29 mmol/L (ref 22–32)
Calcium: 9.4 mg/dL (ref 8.9–10.3)
Chloride: 101 mmol/L (ref 98–111)
Creatinine, Ser: 0.78 mg/dL (ref 0.61–1.24)
GFR, Estimated: >60 mL/min (ref >60)
Glucose, Bld: 199 mg/dL — ABNORMAL HIGH (ref 70–99)
Potassium: 4.5 mmol/L (ref 3.5–5.1)
Sodium: 140 mmol/L (ref 135–145)

## 2023-02-11 LAB — GLUCOSE, CAPILLARY
Glucose-Capillary: 341 mg/dL — ABNORMAL HIGH (ref 70–99)
Glucose-Capillary: 428 mg/dL — ABNORMAL HIGH (ref 70–99)
Glucose-Capillary: 104 mg/dL — ABNORMAL HIGH (ref 70–99)
Glucose-Capillary: 78 mg/dL (ref 70–99)

## 2023-02-11 LAB — CBC
HCT: 27.5 % — ABNORMAL LOW (ref 39.0–52.0)
Hemoglobin: 7.8 g/dL — ABNORMAL LOW (ref 13.0–17.0)
MCH: 25.4 pg — ABNORMAL LOW (ref 26.0–34.0)
MCHC: 28.4 g/dL — ABNORMAL LOW (ref 30.0–36.0)
MCV: 89.6 fL (ref 80.0–100.0)
Platelets: 306 10*3/uL (ref 150–400)
RBC: 3.07 MIL/uL — ABNORMAL LOW (ref 4.22–5.81)
RDW: 25.5 % — ABNORMAL HIGH (ref 11.5–15.5)
WBC: 11.7 10*3/uL — ABNORMAL HIGH (ref 4.0–10.5)
nRBC: 1.4 % — ABNORMAL HIGH (ref 0.0–0.2)

## 2023-02-11 MED ORDER — INSULIN ASPART 100 UNIT/ML IJ SOLN
4.0000 [IU] | Freq: Three times a day (TID) | INTRAMUSCULAR | Status: DC
  Administered 2023-02-11 – 2023-02-19 (×17): 4 [IU] via SUBCUTANEOUS

## 2023-02-11 MED ORDER — METHYLPREDNISOLONE SODIUM SUCC 125 MG IJ SOLR
125.0000 mg | Freq: Every day | INTRAMUSCULAR | Status: DC
  Administered 2023-02-12: 125 mg via INTRAVENOUS
  Filled 2023-02-11: qty 2

## 2023-02-11 MED ORDER — INSULIN GLARGINE-YFGN 100 UNIT/ML ~~LOC~~ SOLN
15.0000 [IU] | Freq: Every day | SUBCUTANEOUS | Status: DC
  Filled 2023-02-11: qty 0.15

## 2023-02-11 MED ORDER — INSULIN GLARGINE-YFGN 100 UNIT/ML ~~LOC~~ SOLN: 4.0000 [IU] | Freq: Every day | SUBCUTANEOUS | Status: DC

## 2023-02-11 MED ORDER — INSULIN GLARGINE-YFGN 100 UNIT/ML ~~LOC~~ SOLN
10.0000 [IU] | Freq: Every day | SUBCUTANEOUS | Status: DC
  Administered 2023-02-11: 10 [IU] via SUBCUTANEOUS
  Filled 2023-02-11: qty 0.1

## 2023-02-11 MED ORDER — INSULIN GLARGINE-YFGN 100 UNIT/ML ~~LOC~~ SOLN
5.0000 [IU] | Freq: Once | SUBCUTANEOUS | Status: AC
  Administered 2023-02-11: 5 [IU] via SUBCUTANEOUS
  Filled 2023-02-11: qty 0.05

## 2023-02-11 MED ORDER — FUROSEMIDE 10 MG/ML IJ SOLN
40.0000 mg | Freq: Every day | INTRAMUSCULAR | Status: DC
  Administered 2023-02-11 – 2023-02-13 (×3): 40 mg via INTRAVENOUS
  Filled 2023-02-11 (×3): qty 4

## 2023-02-11 MED ORDER — GLUCERNA SHAKE PO LIQD
237.0000 mL | Freq: Two times a day (BID) | ORAL | Status: DC
  Administered 2023-02-12 – 2023-02-21 (×11): 237 mL via ORAL

## 2023-02-11 MED ORDER — LEVOTHYROXINE SODIUM 25 MCG PO TABS
137.0000 ug | ORAL_TABLET | Freq: Every day | ORAL | Status: DC
  Administered 2023-02-12 – 2023-02-20 (×9): 137 ug via ORAL
  Filled 2023-02-11 (×9): qty 1

## 2023-02-11 NOTE — Plan of Care (Signed)
  Problem: Education: Goal: Ability to describe self-care measures that may prevent or decrease complications (Diabetes Survival Skills Education) will improve Outcome: Progressing Goal: Individualized Educational Video(s) Outcome: Progressing   Problem: Coping: Goal: Ability to adjust to condition or change in health will improve Outcome: Progressing   Problem: Fluid Volume: Goal: Ability to maintain a balanced intake and output will improve Outcome: Progressing   Problem: Metabolic: Goal: Ability to maintain appropriate glucose levels will improve Outcome: Progressing   Problem: Nutritional: Goal: Maintenance of adequate nutrition will improve Outcome: Progressing Goal: Progress toward achieving an optimal weight will improve Outcome: Progressing   Problem: Skin Integrity: Goal: Risk for impaired skin integrity will decrease Outcome: Progressing   Problem: Tissue Perfusion: Goal: Adequacy of tissue perfusion will improve Outcome: Progressing   Problem: Education: Goal: Knowledge of General Education information will improve Description: Including pain rating scale, medication(s)/side effects and non-pharmacologic comfort measures Outcome: Progressing   Problem: Health Behavior/Discharge Planning: Goal: Ability to manage health-related needs will improve Outcome: Progressing   Problem: Coping: Goal: Level of anxiety will decrease Outcome: Progressing   Problem: Elimination: Goal: Will not experience complications related to bowel motility Outcome: Progressing Goal: Will not experience complications related to urinary retention Outcome: Progressing   Problem: Pain Managment: Goal: General experience of comfort will improve and/or be controlled Outcome: Progressing   Problem: Safety: Goal: Ability to remain free from injury will improve Outcome: Progressing   Problem: Skin Integrity: Goal: Risk for impaired skin integrity will decrease Outcome:  Progressing

## 2023-02-11 NOTE — Progress Notes (Signed)
 Mobility Specialist Progress Note:   02/11/23 1210  Mobility  Activity Stood at bedside (x3 and 10 sets of Marches)  Level of Assistance Contact guard assist, steadying assist  Assistive Device Front wheel walker  Activity Response Tolerated well  Mobility Referral Yes  Mobility visit 1 Mobility  Mobility Specialist Start Time (ACUTE ONLY) 1040  Mobility Specialist Stop Time (ACUTE ONLY) 1052  Mobility Specialist Time Calculation (min) (ACUTE ONLY) 12 min   Pt received in chair eager for mobility. Perform x23 STS and 10 sets of marches w/o fault. O2 flow needed to be increased to 10L/min to keep SPO2 above 88% during activity. Returned to seated in the chair w/ call bell and personal belongings in reach. All needs met. Chair alarm on.  Thersia Minder Mobility Specialist  Please contact vis Secure Chat or  Rehab Office [phone]

## 2023-02-11 NOTE — TOC Progression Note (Addendum)
 Transition of Care [facility]) - Progression Note    Patient Details  Name: Gary [name] MRN: [id number] Date of Birth: 07-12-1945  Transition of [facility]) CM/SW Contact  Andrez JULIANNA George, RN Phone Number: 02/11/2023, 1:19 PM  Clinical Narrative:    Down to 4 L of oxygen  at rest. Still continues to have significant exertional dyspnea.   Current plan is for d/c home with resumption of home health services through [facility]. Home health orders will need to be entered per MD with face to face.  TOC following for further d/c needs.        Expected Discharge Plan and Services                                               Social Determinants of Health (SDOH) Interventions SDOH Screenings   Food Insecurity: No Food Insecurity (02/04/2023)  Housing: Low Risk  (02/04/2023)  Transportation Needs: No Transportation Needs (02/04/2023)  Utilities: Not At Risk (02/04/2023)  Social Connections: Moderately Isolated (02/04/2023)  Tobacco Use: Medium Risk (02/03/2023)    Readmission Risk Interventions    03/02/2022    4:16 PM  Readmission Risk Prevention Plan  Transportation Screening Complete  HRI or Home Care Consult Complete  Palliative Care Screening Not Applicable  Medication Review (RN Care Manager) Complete

## 2023-02-11 NOTE — Progress Notes (Signed)
 Physical Therapy Treatment Patient Details Name: Gary [name] MRN: [id number] DOB: Feb 17, 1945 Today's Date: 02/11/2023   History of Present Illness Pt is 78 yo male admitted on 1/30/425 with acute on chronic resp failure with hypoxia.  Pt with hx including but not limited to Wegener's granulomatosis, ILD, former smoker, CAD/CABG, afib, CHF, DM2    PT Comments  Pt received sitting in the recliner and agreeable to session. Pt declines ambulation due to weakness and shortness of breath, but is agreeable to standing exercise.  Pt able to tolerate 3 trials of standing marches for improved endurance. Pt noted to desat with each trial as low as 83%, requiring O2 increase from 4L to 6L, seated rest breaks, and pursed lip breathing for recovery. Pt then able to perform seated BLE exercises and shoulder retraction for improved posture. Pt continues to benefit from PT services to progress toward functional mobility goals.    If plan is discharge home, recommend the following: Assistance with cooking/housework;Assist for transportation;Help with stairs or ramp for entrance   Can travel by private vehicle        Equipment Recommendations  None recommended by PT    Recommendations for Other Services       Precautions / Restrictions Precautions Precautions: Other (comment) Precaution Comments: watch sats Restrictions Weight Bearing Restrictions Per Provider Order: No     Mobility  Bed Mobility               General bed mobility comments: Pt in recliner throughout session    Transfers Overall transfer level: Needs assistance Equipment used: Rolling walker (2 wheels) Transfers: Sit to/from Stand Sit to Stand: Supervision           General transfer comment: STS from recliner x3 with cues for hand placement    Ambulation/Gait             Pre-gait activities: static standing marches General Gait Details: Pt declines due to increased fatigue       Balance Overall  balance assessment: Mild deficits observed, not formally tested                                          Cognition Arousal: Alert Behavior During Therapy: WFL for tasks assessed/performed Overall Cognitive Status: Within Functional Limits for tasks assessed                                          Exercises General Exercises - Lower Extremity Long Arc Quad: AROM, Seated, Both, 10 reps Hip Flexion/Marching: AROM, Seated, Both, 10 reps Other Exercises Other Exercises: BLE static standing marches for ~15 secs x3 Other Exercises: B seated shoulder retraction x5    General Comments General comments (skin integrity, edema, etc.): Pt on 4L O2 upon entry with SpO2 94%. Desat as low as 83% with mobility requiring increase up to 6L. Pt continues to desat with mobility, but is able to recover to >90% with extended seated rest and pursed lip breathing      Pertinent Vitals/Pain Pain Assessment Pain Assessment: No/denies pain     PT Goals (current goals can now be found in the care plan section) Acute Rehab PT Goals Patient Stated Goal: home PT Goal Formulation: With patient Time For Goal Achievement: 02/18/23 Progress towards PT goals: Progressing  toward goals    Frequency    Min 1X/week       AM-PAC PT 6 Clicks Mobility   Outcome Measure  Help needed turning from your back to your side while in a flat bed without using bedrails?: None Help needed moving from lying on your back to sitting on the side of a flat bed without using bedrails?: None Help needed moving to and from a bed to a chair (including a wheelchair)?: A Little Help needed standing up from a chair using your arms (e.g., wheelchair or bedside chair)?: A Little Help needed to walk in hospital room?: A Little Help needed climbing 3-5 steps with a railing? : A Little 6 Click Score: 20    End of Session Equipment Utilized During Treatment: Oxygen  Activity Tolerance: Patient  limited by fatigue;Other (comment) (DOE) Patient left: with call bell/phone within reach;in chair Nurse Communication: Mobility status PT Visit Diagnosis: Difficulty in walking, not elsewhere classified (R26.2)     Time: 8455-8386 PT Time Calculation (min) (ACUTE ONLY): 29 min  Charges:    $Therapeutic Exercise: 8-22 mins $Therapeutic Activity: 8-22 mins PT General Charges $$ ACUTE PT VISIT: 1 Visit                    Darryle George, PTA Acute Rehabilitation Services Secure Chat Preferred  [phone]    Darryle George 02/11/2023, 4:34 PM

## 2023-02-11 NOTE — Progress Notes (Signed)
 02/11/2023   I have seen and evaluated the patient for GPA ILD flare   S:  Feeling better again today, improved taste in food.   O: Blood pressure 96/66, pulse 64, temperature 97.6 F (36.4 C), temperature source Oral, resp. rate 17, height 5' 3 (1.6 m), weight 81.6 kg, SpO2 94 %.   No distress Sats 99 6LPM Minimal crackles Aox3 Globally weak stable  Cr stable Neg bubble study   A:  ILD flare Likely complicated by relative protein calorie malnutrition and third spacing +/- additional hydrostatic edema  P:  - Drop steroids to daily - Continue cellcept , may need to go up on dose or switch to cyclophosphamide  if continues to languish will discuss with his primary pulmonologist Dr. Geronimo - Wean o2 for sats > 90% - Encourage nutrition - Push diuresis as tolerated by renal function: lasix  IV ordered daily - His improved appetite and energy may be a sign we are turning corner, put him down to 4LPM; he wants to be able to transfer on 2LPM if possible for DC   Rolan Sharps MD [facility] Prefer epic messenger for cross cover needs If after hours, please call E-link

## 2023-02-11 NOTE — Progress Notes (Addendum)
 PROGRESS NOTE        PATIENT DETAILS Name: Gary [name] Age: 78 y.o. Sex: male Date of Birth: 12-08-1945 Admit Date: 02/03/2023 Admitting Physician Gary MARLA Stank, MD ERE:Dxjxoz, Gary [name]  Brief Summary: Patient is a 78 y.o.  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 2/06>> Echo:EF 60-65%, agitated saline contrast bubble study negative-normal evidence of interatrial shunt.  Significant microbiology data: 1/30>> respiratory virus panel: Negative 1/30>> COVID/influenza/RSV PCR: Negative  Procedures: None  Consults: PCCM  Subjective: Feels slightly better-down to 4 L of oxygen  at rest.  Still continues to have significant exertional dyspnea.  Objective: Vitals: Blood pressure 127/65, pulse 76, temperature 97.9 F (36.6 C), temperature source Oral, resp. rate 20, height 5' 10 (1.778 m), weight 96.8 kg, SpO2 92%.   Exam: Gen Exam:Alert awake-not in any distress HEENT:atraumatic, normocephalic Chest: Bibasilar rales CVS:S1S2 regular Abdomen:soft non tender, non distended Extremities:no edema Neurology: Non focal Skin: no rash  Pertinent Labs/Radiology:    Latest Ref Rng & Units 02/11/2023    5:27 AM 02/10/2023    4:50 AM 02/08/2023    4:53 AM  CBC  WBC 4.0 - 10.5 K/uL 11.7  10.2  14.0   Hemoglobin 13.0 - 17.0 g/dL 7.8  7.7  9.3   Hematocrit 39.0 - 52.0 % 27.5  26.6  32.2   Platelets 150 - 400 K/uL 306  302  389     Lab Results  Component Value Date   NA 140 02/11/2023   K 4.5  02/11/2023   CL 101 02/11/2023   CO2 29 02/11/2023      Assessment/Plan: Acute 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   Echo without cardiac shunt Remains on cellcept /Solu-Medrol  Volume status stable-on Lasix  to ensure negative balance-as long as electrolytes/BP tolerates. Continue to mobilize-patient aware tactically will continue to have significant amounts of exertional dyspnea for the next several weeks Incentive spirometry/flutter valve  History of granulomatosis with polyangiitis (GPA) Was on tapering steroids (down to 40 mg)-when he developed worsening respiratory failure Prophylactic dapsone  for PJP (no Bactrim  as interaction with Tikosyn ) PCCM has started CellCept -no longer on Rituxan  Currently on IV Solu-Medrol  CRP downtrending.  Paroxysmal atrial fibrillation Maintaining sinus rhythm Tikosyn /Coreg  Has been transitioned to Eliquis -no plans to restart Coumadin  on discharge.  Chronic HFpEF Euvolemic-on Lasix  to ensure negative balance.  History of CAD s/p PCI September 2023 Aspirin  No longer on Plavix -this was also not on his most recent discharge list of medications.  Iron deficiency anemia Appreciate GI input-plans are for outpatient endoscopy evaluation once his respiratory status improves-No overt bleeding noted-some drop in Hb but no obvious evidence of GI bleed.  Given IV iron in 2/4-follow CBC  Hypothyroidism Synthroid   DM-2 with steroid related hyperglycemia CBGs remain uncontrolled Increase Semglee  to 15 units-add 4 units of NovoLog  with meals Continue resistant SSI.   Recent Labs    02/10/23  1529 02/10/23 2134 02/11/23 0831  GLUCAP 247* 205* 341*    Mood disorder Stable Continue Effexor    BMI: Estimated body mass index is 30.62 kg/m as calculated from the following:   Height as of this encounter: 5'  10 (1.778 m).   Weight as of this encounter: 96.8 kg.   Code status:   Code Status: Full Code   DVT Prophylaxis: SCDs Start: 02/03/23 1523 apixaban  (ELIQUIS ) tablet 5 mg    Family Communication:Spouse Gary [name]-[phone] on 2/4   Disposition Plan: Status is: Inpatient Remains inpatient appropriate because: Severity of illness   Planned Discharge Destination:Home health   Diet: Diet Order             Diet regular Fluid consistency: Thin  Diet effective now                     Antimicrobial agents: Anti-infectives (From admission, onward)    Start     Dose/Rate Route Frequency Ordered Stop   02/03/23 1445  dapsone  tablet 100 mg        100 mg Oral Daily 02/03/23 1442          MEDICATIONS: Scheduled Meds:  ALPRAZolam   0.5 mg Oral QHS   apixaban   5 mg Oral BID   aspirin   81 mg Oral Daily   carvedilol   3.125 mg Oral BID WC   dapsone   100 mg Oral Daily   dofetilide   500 mcg Oral BID   dronabinol   5 mg Oral QAC lunch   feeding supplement  237 mL Oral BID BM   furosemide   40 mg Intravenous Daily   insulin  aspart  0-20 Units Subcutaneous TID WC   insulin  aspart  4 Units Subcutaneous TID WC   [START ON 02/12/2023] insulin  glargine-yfgn  15 Units Subcutaneous Daily   insulin  glargine-yfgn  5 Units Subcutaneous Once   [START ON 02/12/2023] levothyroxine   137 mcg Oral Q0600   [START ON 02/12/2023] methylPREDNISolone  (SOLU-MEDROL ) injection  125 mg Intravenous Daily   mycophenolate   500 mg Oral BID   pantoprazole   40 mg Oral BID   venlafaxine  XR  150 mg Oral Daily   Continuous Infusions:   PRN Meds:.acetaminophen  **OR** acetaminophen , bisacodyl , metoprolol  tartrate, nitroGLYCERIN , mouth rinse, promethazine    I have personally reviewed following labs and imaging studies  LABORATORY DATA: CBC: Recent Labs  Lab 02/05/23 0531 02/06/23 0534 02/07/23 0509 02/08/23 0453 02/10/23 0450 02/11/23 0527  WBC 14.5* 15.7* 12.6* 14.0* 10.2 11.7*  NEUTROABS 12.8* 13.8*  10.7* 12.4*  --   --   HGB 8.6* 8.8* 8.4* 9.3* 7.7* 7.8*  HCT 29.9* 30.5* 29.4* 32.2* 26.6* 27.5*  MCV 85.4 86.4 87.2 87.0 88.1 89.6  PLT 391 412* 376 389 302 306    Basic Metabolic Panel: Recent Labs  Lab 02/05/23 0531 02/06/23 0534 02/07/23 0509 02/08/23 0453 02/09/23 0440 02/10/23 0856 02/11/23 0527  NA 140 139 142 142 139 140 140  K 3.9 3.9 4.4 4.0 4.1 3.6 4.5  CL 102 104 107 102 101 101 101  CO2 23 25 25 27 27 24 29   GLUCOSE 161* 150* 152* 148* 166* 309* 199*  BUN 28* 33* 28* 24* 22 42* 40*  CREATININE 1.13 1.03 1.01 0.84 0.88 1.05 0.78  CALCIUM  9.2 9.1 9.3 9.3 9.4 9.7 9.4  MG 2.4 2.3 2.3  --   --   --   --   PHOS 3.8 3.4 3.6  --   --   --   --  GFR: Estimated Creatinine Clearance: 87.4 mL/min (by C-G formula based on SCr of 0.78 mg/dL).  Liver Function Tests: No results for input(s): AST, ALT, ALKPHOS, BILITOT, PROT, ALBUMIN  in the last 168 hours.  No results for input(s): LIPASE, AMYLASE in the last 168 hours. No results for input(s): AMMONIA in the last 168 hours.  Coagulation Profile: Recent Labs  Lab 02/06/23 1033 02/07/23 0509  INR 1.2 1.2    Cardiac Enzymes: No results for input(s): CKTOTAL, CKMB, CKMBINDEX, TROPONINI in the last 168 hours.  BNP (last 3 results) Recent Labs    11/04/22 1519  PROBNP 416.0*    Lipid Profile: No results for input(s): CHOL, HDL, LDLCALC, TRIG, CHOLHDL, LDLDIRECT in the last 72 hours.  Thyroid  Function Tests: No results for input(s): TSH, T4TOTAL, FREET4, T3FREE, THYROIDAB in the last 72 hours.  Anemia Panel: No results for input(s): VITAMINB12, FOLATE, FERRITIN, TIBC, IRON, RETICCTPCT in the last 72 hours.   Urine analysis:    Component Value Date/Time   COLORURINE YELLOW 09/17/2022 2236   APPEARANCEUR HAZY (A) 09/17/2022 2236   LABSPEC 1.010 09/17/2022 2236   PHURINE 6.5 09/17/2022 2236   GLUCOSEU NEGATIVE 09/17/2022 2236   HGBUR NEGATIVE  09/17/2022 2236   BILIRUBINUR NEGATIVE 09/17/2022 2236   KETONESUR NEGATIVE 09/17/2022 2236   PROTEINUR NEGATIVE 09/17/2022 2236   UROBILINOGEN 0.2 12/24/2011 0805   NITRITE NEGATIVE 09/17/2022 2236   LEUKOCYTESUR SMALL (A) 09/17/2022 2236    Sepsis Labs: Lactic Acid, Venous No results found for: LATICACIDVEN  MICROBIOLOGY: Recent Results (from the past 240 hours)  Resp panel by RT-PCR (RSV, Flu A&B, Covid) Anterior Nasal Swab     Status: None   Collection Time: 02/03/23 12:16 PM   Specimen: Anterior Nasal Swab  Result Value Ref Range Status   SARS Coronavirus 2 by RT PCR NEGATIVE NEGATIVE Final   Influenza A by PCR NEGATIVE NEGATIVE Final   Influenza B by PCR NEGATIVE NEGATIVE Final    Comment: (NOTE) The Xpert Xpress SARS-CoV-2/FLU/RSV plus assay is intended as an aid in the diagnosis of influenza from Nasopharyngeal swab specimens and should not be used as a sole basis for treatment. Nasal washings and aspirates are unacceptable for Xpert Xpress SARS-CoV-2/FLU/RSV testing.  Fact Sheet for Patients: bloggercourse.com  Fact Sheet for Healthcare Providers: seriousbroker.it  This test is not yet approved or cleared by the [place]  FDA and has been authorized for detection and/or diagnosis of SARS-CoV-2 by FDA under an Emergency Use Authorization (EUA). This EUA will remain in effect (meaning this test can be used) for the duration of the COVID-19 declaration under Section 564(b)(1) of the Act, 21 U.S.C. section 360bbb-3(b)(1), unless the authorization is terminated or revoked.     Resp Syncytial Virus by PCR NEGATIVE NEGATIVE Final    Comment: (NOTE) Fact Sheet for Patients: bloggercourse.com  Fact Sheet for Healthcare Providers: seriousbroker.it  This test is not yet approved or cleared by the [place]  FDA and has been authorized for detection and/or  diagnosis of SARS-CoV-2 by FDA under an Emergency Use Authorization (EUA). This EUA will remain in effect (meaning this test can be used) for the duration of the COVID-19 declaration under Section 564(b)(1) of the Act, 21 U.S.C. section 360bbb-3(b)(1), unless the authorization is terminated or revoked.  Performed at [facility], [address], [place], [place] [address]   MRSA Next Gen by PCR, Nasal     Status: None   Collection Time: 02/03/23  2:44 PM   Specimen: Nasal Mucosa;  Nasal Swab  Result Value Ref Range Status   MRSA by PCR Next Gen NOT DETECTED NOT DETECTED Final    Comment: (NOTE) The GeneXpert MRSA Assay (FDA approved for NASAL specimens only), is one component of a comprehensive MRSA colonization surveillance program. It is not intended to diagnose MRSA infection nor to guide or monitor treatment for MRSA infections. Test performance is not FDA approved in patients less than 84 years old. Performed at [facility], [address], [place], [place] [address]   Respiratory (~20 pathogens) panel by PCR     Status: None   Collection Time: 02/03/23  3:22 PM   Specimen: Nasopharyngeal Swab; Respiratory  Result Value Ref Range Status   Adenovirus NOT DETECTED NOT DETECTED Final   Coronavirus 229E NOT DETECTED NOT DETECTED Final    Comment: (NOTE) The Coronavirus on the Respiratory Panel, DOES NOT test for the novel  Coronavirus (2019 nCoV)    Coronavirus HKU1 NOT DETECTED NOT DETECTED Final   Coronavirus NL63 NOT DETECTED NOT DETECTED Final   Coronavirus OC43 NOT DETECTED NOT DETECTED Final   Metapneumovirus NOT DETECTED NOT DETECTED Final   Rhinovirus / Enterovirus NOT DETECTED NOT DETECTED Final   Influenza A NOT DETECTED NOT DETECTED Final   Influenza B NOT DETECTED NOT DETECTED Final   Parainfluenza Virus 1 NOT DETECTED NOT DETECTED Final   Parainfluenza Virus 2 NOT DETECTED NOT DETECTED Final   Parainfluenza Virus 3 NOT DETECTED NOT DETECTED Final    Parainfluenza Virus 4 NOT DETECTED NOT DETECTED Final   Respiratory Syncytial Virus NOT DETECTED NOT DETECTED Final   Bordetella pertussis NOT DETECTED NOT DETECTED Final   Bordetella Parapertussis NOT DETECTED NOT DETECTED Final   Chlamydophila pneumoniae NOT DETECTED NOT DETECTED Final   Mycoplasma pneumoniae NOT DETECTED NOT DETECTED Final    Comment: Performed at [name], [address], [place], [place] [address]    RADIOLOGY STUDIES/RESULTS: ECHOCARDIOGRAM LIMITED BUBBLE STUDY Result Date: 02/10/2023    ECHOCARDIOGRAM LIMITED REPORT   Patient Name:   Gary [name] Date of Exam: 02/10/2023 Medical Rec #:  [id number]         Height:       70.0 in Accession #:    [id number]        Weight:       201.7 lb Date of Birth:  1945/04/09          BSA:          2.095 m Patient Age:    77 years          BP:           149/81 mmHg Patient Gender: M                 HR:           75 bpm. Exam Location:  Inpatient Procedure: Limited Echo, Cardiac Doppler, Color Doppler and Saline Contrast            Bubble Study Indications:    ASD  History:        Patient has prior history of Echocardiogram examinations, most                 recent 12/07/2022. CAD; Risk Factors:Hypertension, Diabetes,                 Dyslipidemia, Former Smoker and Sleep Apnea.  Sonographer:    Ozell Free Referring Phys: [id number] TORIBIO BROCKS SMITH IMPRESSIONS  1. Left  ventricular ejection fraction, by estimation, is 60 to 65%. The left ventricle has normal function. The left ventricle has no regional wall motion abnormalities. There is moderate concentric left ventricular hypertrophy. Left ventricular diastolic function could not be evaluated.  2. Right ventricular systolic function is normal. The right ventricular size is normal. There is moderately elevated pulmonary artery systolic pressure.  3. The mitral valve is degenerative. Mild mitral valve regurgitation. No evidence of mitral stenosis. Moderate mitral annular calcification.  4.  The aortic valve is calcified. There is mild calcification of the aortic valve. Aortic valve regurgitation is not visualized. Aortic valve sclerosis/calcification is present, without any evidence of aortic stenosis.  5. The inferior vena cava is normal in size with greater than 50% respiratory variability, suggesting right atrial pressure of 3 mmHg.  6. Agitated saline contrast bubble study was negative, with no evidence of any interatrial shunt. FINDINGS  Left Ventricle: Left ventricular ejection fraction, by estimation, is 60 to 65%. The left ventricle has normal function. The left ventricle has no regional wall motion abnormalities. The left ventricular internal cavity size was normal in size. There is  moderate concentric left ventricular hypertrophy. Left ventricular diastolic function could not be evaluated. Left ventricular diastolic function could not be evaluated due to mitral annular calcification (moderate or greater). Right Ventricle: The right ventricular size is normal. No increase in right ventricular wall thickness. Right ventricular systolic function is normal. There is moderately elevated pulmonary artery systolic pressure. The tricuspid regurgitant velocity is 3.58 m/s, and with an assumed right atrial pressure of 3 mmHg, the estimated right ventricular systolic pressure is 54.3 mmHg. Left Atrium: Left atrial size was not assessed. Right Atrium: Right atrial size was not assessed. Pericardium: There is no evidence of pericardial effusion. Mitral Valve: The mitral valve is degenerative in appearance. Moderate mitral annular calcification. Mild mitral valve regurgitation. No evidence of mitral valve stenosis. Tricuspid Valve: The tricuspid valve is normal in structure. Tricuspid valve regurgitation is mild . No evidence of tricuspid stenosis. Aortic Valve: The aortic valve is calcified. There is mild calcification of the aortic valve. Aortic valve regurgitation is not visualized. Aortic valve  sclerosis/calcification is present, without any evidence of aortic stenosis. Pulmonic Valve: The pulmonic valve was not well visualized. Pulmonic valve regurgitation is not visualized. No evidence of pulmonic stenosis. Aorta: Aortic root could not be assessed. Venous: The inferior vena cava is normal in size with greater than 50% respiratory variability, suggesting right atrial pressure of 3 mmHg. IAS/Shunts: No atrial level shunt detected by color flow Doppler. Agitated saline contrast was given intravenously to evaluate for intracardiac shunting. Agitated saline contrast bubble study was negative, with no evidence of any interatrial shunt. Additional Comments: Spectral Doppler performed. Color Doppler performed.  TRICUSPID VALVE TR Peak grad:   51.3 mmHg TR Vmax:        358.00 cm/s Dub Tobb [name] Electronically signed by Dub Huntsman [name] Signature Date/Time: 02/10/2023/12:47:16 PM    Final      LOS: 8 days   Donalda Applebaum, MD  [facility]    To contact the attending provider between 7A-7P or the covering provider during after hours 7P-7A, please log into the web site [email or web address] and access using universal [place] password for that web site. If you [name] not have the password, please call the hospital operator.  02/11/2023, 10:28 AM

## 2023-02-12 ENCOUNTER — Inpatient Hospital Stay (HOSPITAL_COMMUNITY): Payer: Medicare Other

## 2023-02-12 DIAGNOSIS — I5032 Chronic diastolic (congestive) heart failure: Secondary | ICD-10-CM | POA: Diagnosis not present

## 2023-02-12 DIAGNOSIS — J849 Interstitial pulmonary disease, unspecified: Secondary | ICD-10-CM | POA: Diagnosis not present

## 2023-02-12 DIAGNOSIS — J9621 Acute and chronic respiratory failure with hypoxia: Secondary | ICD-10-CM | POA: Diagnosis not present

## 2023-02-12 LAB — CBC
HCT: 29.8 % — ABNORMAL LOW (ref 39.0–52.0)
Hemoglobin: 8.6 g/dL — ABNORMAL LOW (ref 13.0–17.0)
MCH: 26.1 pg (ref 26.0–34.0)
MCHC: 28.9 g/dL — ABNORMAL LOW (ref 30.0–36.0)
MCV: 90.6 fL (ref 80.0–100.0)
Platelets: 322 10*3/uL (ref 150–400)
RBC: 3.29 MIL/uL — ABNORMAL LOW (ref 4.22–5.81)
RDW: 26.1 % — ABNORMAL HIGH (ref 11.5–15.5)
WBC: 12.7 10*3/uL — ABNORMAL HIGH (ref 4.0–10.5)
nRBC: 3.2 % — ABNORMAL HIGH (ref 0.0–0.2)

## 2023-02-12 LAB — BASIC METABOLIC PANEL
Anion gap: 10 (ref 5–15)
BUN: 37 mg/dL — ABNORMAL HIGH (ref 8–23)
CO2: 30 mmol/L (ref 22–32)
Calcium: 9.5 mg/dL (ref 8.9–10.3)
Chloride: 99 mmol/L (ref 98–111)
Creatinine, Ser: 0.91 mg/dL (ref 0.61–1.24)
GFR, Estimated: >60 mL/min (ref >60)
Glucose, Bld: 75 mg/dL (ref 70–99)
Potassium: 4 mmol/L (ref 3.5–5.1)
Sodium: 139 mmol/L (ref 135–145)

## 2023-02-12 LAB — SEDIMENTATION RATE: Sed Rate: 40 mm/h — ABNORMAL HIGH (ref 0–16)

## 2023-02-12 LAB — GLUCOSE, CAPILLARY
Glucose-Capillary: 66 mg/dL — ABNORMAL LOW (ref 70–99)
Glucose-Capillary: 110 mg/dL — ABNORMAL HIGH (ref 70–99)
Glucose-Capillary: 229 mg/dL — ABNORMAL HIGH (ref 70–99)
Glucose-Capillary: 333 mg/dL — ABNORMAL HIGH (ref 70–99)
Glucose-Capillary: 277 mg/dL — ABNORMAL HIGH (ref 70–99)

## 2023-02-12 LAB — PROCALCITONIN: Procalcitonin: 0.5 ng/mL

## 2023-02-12 LAB — BRAIN NATRIURETIC PEPTIDE: B Natriuretic Peptide: 183.8 pg/mL — ABNORMAL HIGH (ref 0.0–100.0)

## 2023-02-12 MED ORDER — HYDROCOD POLI-CHLORPHE POLI ER 10-8 MG/5ML PO SUER
5.0000 mL | Freq: Every evening | ORAL | Status: DC | PRN
  Administered 2023-02-12 – 2023-02-15 (×2): 5 mL via ORAL
  Filled 2023-02-12 (×3): qty 5

## 2023-02-12 MED ORDER — INSULIN GLARGINE-YFGN 100 UNIT/ML ~~LOC~~ SOLN
10.0000 [IU] | Freq: Every day | SUBCUTANEOUS | Status: DC
  Administered 2023-02-12: 10 [IU] via SUBCUTANEOUS
  Filled 2023-02-12 (×2): qty 0.1

## 2023-02-12 MED ORDER — PREDNISONE 20 MG PO TABS
40.0000 mg | ORAL_TABLET | Freq: Two times a day (BID) | ORAL | Status: DC
  Administered 2023-02-13: 40 mg via ORAL
  Filled 2023-02-12: qty 2

## 2023-02-12 NOTE — Progress Notes (Signed)
 PROGRESS NOTE        PATIENT DETAILS Name: Gary [name] Age: 78 y.o. Sex: male Date of Birth: 03-15-1945 Admit Date: 02/03/2023 Admitting Physician Lavada MARLA Stank, MD ERE:Dxjxoz, Massie, DO  Brief Summary: Patient is a 78 y.o.  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 2/06>> Echo:EF 60-65%, agitated saline contrast bubble study negative-normal evidence of interatrial shunt.  Significant microbiology data: 1/30>> respiratory virus panel: Negative 1/30>> COVID/influenza/RSV PCR: Negative  Procedures: None  Consults: PCCM  Subjective: Had a rough night-more cough overnight-on 6 L of oxygen  now.  Feels more short of breath today than yesterday.  Also feels very tired.  Objective: Vitals: Blood pressure 113/76, pulse 100, temperature 98.2 F (36.8 C), temperature source Oral, resp. rate 20, height 5' 10 (1.778 m), weight 96.8 kg, SpO2 92%.   Exam: Gen Exam:Alert awake-not in any distress HEENT:atraumatic, normocephalic Chest: Moving air-inspiratory rales all over. 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 Although hypoxia slowly improving-he continues to have severe exertional dyspnea with minimal activity with significant drops in saturation-with slow but gradual recovery with rest.  He seems a bit more fatigued/tired today. Remains on CellCept /Solu-Medrol  (reduced to daily dosing on 2/7) On IV Lasix  to ensure negative balance-he is euvolemic on exam. Echo without cardiac shunt-stable EF Continue attempts to slowly titrate down FiO2. PCCM continues to follow  History of granulomatosis with polyangiitis (GPA) Was on tapering steroids (down to 40 mg)-when he developed worsening respiratory failure Prophylactic dapsone  for PJP (no Bactrim  as interaction with Tikosyn ) PCCM started CellCept -no longer on Rituxan  Currently on IV Solu-Medrol -dose reduced from twice daily to daily on 2/7.  Paroxysmal atrial fibrillation Maintaining sinus rhythm Tikosyn /Coreg  Has been transitioned to Eliquis -no plans to restart Coumadin  on discharge.  Chronic HFpEF Euvolemic-on Lasix  to ensure negative balance.  History of CAD s/p PCI September 2023 Aspirin  No longer on Plavix -this was also not on his most recent discharge list of medications.  Iron deficiency anemia Appreciate GI input-plans are for outpatient endoscopy evaluation once his respiratory status improves-No overt bleeding noted-some drop in Hb but no  obvious evidence of GI bleed.  Given IV iron in 2/4-follow CBC  Hypothyroidism Synthroid   DM-2 with steroid related hyperglycemia CBGs continue to fluctuate-elevated yesterday-borderline hypoglycemic today Decrease Semglee  to 10 units Continue SSI Follow-up  with   Recent Labs    02/11/23 2147 02/12/23 0752 02/12/23 0900  GLUCAP 78 66* 110*    Mood disorder Stable Continue Effexor    BMI: Estimated body mass index is 30.62 kg/m as calculated from the following:   Height as of this encounter: 5' 10 (1.778 m).   Weight as of this encounter: 96.8 kg.   Code status:   Code Status: Full Code   DVT Prophylaxis: SCDs Start: 02/03/23 1523 apixaban  (ELIQUIS ) tablet 5 mg    Family Communication:Spouse [name] on 2/4   Disposition Plan: Status is: Inpatient Remains inpatient appropriate because: Severity of illness   Planned Discharge Destination:Home health   Diet: Diet Order             Diet regular Fluid consistency: Thin  Diet effective now                     Antimicrobial agents: Anti-infectives (From admission, onward)    Start     Dose/Rate Route Frequency Ordered Stop   02/03/23 1445  dapsone  tablet 100 mg        100 mg Oral Daily 02/03/23 1442          MEDICATIONS: Scheduled Meds:  ALPRAZolam   0.5 mg Oral QHS   apixaban   5 mg Oral BID   aspirin   81 mg Oral Daily   carvedilol   3.125 mg Oral BID WC   dapsone   100 mg Oral Daily   dofetilide   500 mcg Oral BID   dronabinol   5 mg Oral QAC lunch   feeding supplement (GLUCERNA SHAKE)  237 mL Oral BID BM   furosemide   40 mg Intravenous Daily   insulin  aspart  0-20 Units Subcutaneous TID WC   insulin  aspart  4 Units Subcutaneous TID WC   insulin  glargine-yfgn  10 Units Subcutaneous Daily   levothyroxine   137 mcg Oral Q0600   mycophenolate   500 mg Oral BID   pantoprazole   40 mg Oral BID   [START ON 02/13/2023] predniSONE   40 mg Oral BID WC   venlafaxine  XR  150 mg Oral Daily   Continuous Infusions:   PRN Meds:.acetaminophen  **OR** acetaminophen , bisacodyl , metoprolol  tartrate, nitroGLYCERIN , mouth rinse, promethazine    I have personally reviewed following labs and imaging studies  LABORATORY DATA: CBC: Recent Labs  Lab  02/06/23 0534 02/07/23 0509 02/08/23 0453 02/10/23 0450 02/11/23 0527 02/12/23 0506  WBC 15.7* 12.6* 14.0* 10.2 11.7* 12.7*  NEUTROABS 13.8* 10.7* 12.4*  --   --   --   HGB 8.8* 8.4* 9.3* 7.7* 7.8* 8.6*  HCT 30.5* 29.4* 32.2* 26.6* 27.5* 29.8*  MCV 86.4 87.2 87.0 88.1 89.6 90.6  PLT 412* 376 389 302 306 322    Basic Metabolic Panel: Recent Labs  Lab 02/06/23 0534 02/07/23 0509 02/08/23 0453 02/09/23 0440 02/10/23 0856 02/11/23 0527 02/12/23 0506  NA 139 142 142 139 140 140 139  K 3.9 4.4 4.0 4.1 3.6 4.5 4.0  CL 104 107 102 101 101 101 99  CO2 25 25 27 27 24 29 30   GLUCOSE 150* 152* 148* 166* 309* 199* 75  BUN 33* 28* 24* 22 42* 40* 37*  CREATININE 1.03 1.01 0.84 0.88 1.05 0.78 0.91  CALCIUM  9.1 9.3 9.3 9.4 9.7 9.4  9.5  MG 2.3 2.3  --   --   --   --   --   PHOS 3.4 3.6  --   --   --   --   --     GFR: Estimated Creatinine Clearance: 76.8 mL/min (by C-G formula based on SCr of 0.91 mg/dL).  Liver Function Tests: No results for input(s): AST, ALT, ALKPHOS, BILITOT, PROT, ALBUMIN  in the last 168 hours.  No results for input(s): LIPASE, AMYLASE in the last 168 hours. No results for input(s): AMMONIA in the last 168 hours.  Coagulation Profile: Recent Labs  Lab 02/06/23 1033 02/07/23 0509  INR 1.2 1.2    Cardiac Enzymes: No results for input(s): CKTOTAL, CKMB, CKMBINDEX, TROPONINI in the last 168 hours.  BNP (last 3 results) Recent Labs    11/04/22 1519  PROBNP 416.0*    Lipid Profile: No results for input(s): CHOL, HDL, LDLCALC, TRIG, CHOLHDL, LDLDIRECT in the last 72 hours.  Thyroid  Function Tests: No results for input(s): TSH, T4TOTAL, FREET4, T3FREE, THYROIDAB in the last 72 hours.  Anemia Panel: No results for input(s): VITAMINB12, FOLATE, FERRITIN, TIBC, IRON, RETICCTPCT in the last 72 hours.   Urine analysis:    Component Value Date/Time   COLORURINE YELLOW 09/17/2022 2236    APPEARANCEUR HAZY (A) 09/17/2022 2236   LABSPEC 1.010 09/17/2022 2236   PHURINE 6.5 09/17/2022 2236   GLUCOSEU NEGATIVE 09/17/2022 2236   HGBUR NEGATIVE 09/17/2022 2236   BILIRUBINUR NEGATIVE 09/17/2022 2236   KETONESUR NEGATIVE 09/17/2022 2236   PROTEINUR NEGATIVE 09/17/2022 2236   UROBILINOGEN 0.2 12/24/2011 0805   NITRITE NEGATIVE 09/17/2022 2236   LEUKOCYTESUR SMALL (A) 09/17/2022 2236    Sepsis Labs: Lactic Acid, Venous No results found for: LATICACIDVEN  MICROBIOLOGY: Recent Results (from the past 240 hours)  Resp panel by RT-PCR (RSV, Flu A&B, Covid) Anterior Nasal Swab     Status: None   Collection Time: 02/03/23 12:16 PM   Specimen: Anterior Nasal Swab  Result Value Ref Range Status   SARS Coronavirus 2 by RT PCR NEGATIVE NEGATIVE Final   Influenza A by PCR NEGATIVE NEGATIVE Final   Influenza B by PCR NEGATIVE NEGATIVE Final    Comment: (NOTE) The Xpert Xpress SARS-CoV-2/FLU/RSV plus assay is intended as an aid in the diagnosis of influenza from Nasopharyngeal swab specimens and should not be used as a sole basis for treatment. Nasal washings and aspirates are unacceptable for Xpert Xpress SARS-CoV-2/FLU/RSV testing.  Fact Sheet for Patients: bloggercourse.com  Fact Sheet for Healthcare Providers: seriousbroker.it  This test is not yet approved or cleared by the [place]  FDA and has been authorized for detection and/or diagnosis of SARS-CoV-2 by FDA under an Emergency Use Authorization (EUA). This EUA will remain in effect (meaning this test can be used) for the duration of the COVID-19 declaration under Section 564(b)(1) of the Act, 21 U.S.C. section 360bbb-3(b)(1), unless the authorization is terminated or revoked.     Resp Syncytial Virus by PCR NEGATIVE NEGATIVE Final    Comment: (NOTE) Fact Sheet for Patients: bloggercourse.com  Fact Sheet for Healthcare  Providers: seriousbroker.it  This test is not yet approved or cleared by the [place]  FDA and has been authorized for detection and/or diagnosis of SARS-CoV-2 by FDA under an Emergency Use Authorization (EUA). This EUA will remain in effect (meaning this test can be used) for the duration of the COVID-19 declaration under Section 564(b)(1) of the Act, 21 U.S.C. section 360bbb-3(b)(1), unless the authorization is terminated or  revoked.  Performed at [facility], [address], [place], [place] [address]   MRSA Next Gen by PCR, Nasal     Status: None   Collection Time: 02/03/23  2:44 PM   Specimen: Nasal Mucosa; Nasal Swab  Result Value Ref Range Status   MRSA by PCR Next Gen NOT DETECTED NOT DETECTED Final    Comment: (NOTE) The GeneXpert MRSA Assay (FDA approved for NASAL specimens only), is one component of a comprehensive MRSA colonization surveillance program. It is not intended to diagnose MRSA infection nor to guide or monitor treatment for MRSA infections. Test performance is not FDA approved in patients less than 75 years old. Performed at [facility], [address] [place], [place] [address]   Respiratory (~20 pathogens) panel by PCR     Status: None   Collection Time: 02/03/23  3:22 PM   Specimen: Nasopharyngeal Swab; Respiratory  Result Value Ref Range Status   Adenovirus NOT DETECTED NOT DETECTED Final   Coronavirus 229E NOT DETECTED NOT DETECTED Final    Comment: (NOTE) The Coronavirus on the Respiratory Panel, DOES NOT test for the novel  Coronavirus (2019 nCoV)    Coronavirus HKU1 NOT DETECTED NOT DETECTED Final   Coronavirus NL63 NOT DETECTED NOT DETECTED Final   Coronavirus OC43 NOT DETECTED NOT DETECTED Final   Metapneumovirus NOT DETECTED NOT DETECTED Final   Rhinovirus / Enterovirus NOT DETECTED NOT DETECTED Final   Influenza A NOT DETECTED NOT DETECTED Final   Influenza B NOT DETECTED NOT DETECTED Final    Parainfluenza Virus 1 NOT DETECTED NOT DETECTED Final   Parainfluenza Virus 2 NOT DETECTED NOT DETECTED Final   Parainfluenza Virus 3 NOT DETECTED NOT DETECTED Final   Parainfluenza Virus 4 NOT DETECTED NOT DETECTED Final   Respiratory Syncytial Virus NOT DETECTED NOT DETECTED Final   Bordetella pertussis NOT DETECTED NOT DETECTED Final   Bordetella Parapertussis NOT DETECTED NOT DETECTED Final   Chlamydophila pneumoniae NOT DETECTED NOT DETECTED Final   Mycoplasma pneumoniae NOT DETECTED NOT DETECTED Final    Comment: Performed at [facility], [address] [place], [place] [address]    RADIOLOGY STUDIES/RESULTS: No results found.    LOS: 9 days   Donalda Applebaum, MD  [facility]    To contact the attending provider between 7A-7P or the covering provider during after hours 7P-7A, please log into the web site [email or web address] and access using universal [place] password for that web site. If you do not have the password, please call the hospital operator.  02/12/2023, 12:01 PM

## 2023-02-12 NOTE — Progress Notes (Addendum)
 02/12/2023    History of Present Illness:  This 78 year old Caucasian male is seen in consultation at the request of Dr. Lavada Stank for recommendations on further evaluation and management of hypoxia.  The patient was sent to [facility] emergency department from [facility] pulmonary clinic today, after he presented with reports of having profound hypoxia at home (down to SpO2 45%).  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  He is on rituximab  and steroids.  In fact, he was recently hospitalized for interstitial lung disease flareup and has been on a prednisone  taper, currently on prednisone  40 mg p.o. daily.  Treated with high-dose steroids.  Right heart cath on 01/04/2023 showed normal filling pressures, mild pulmonary hypertension and preserved cardiac output.  He reported doing well after his recent hospitalization and felt that he had a very good day yesterday.  However, this morning he woke up and experienced extreme exertional dyspnea while getting from his bed to the bathroom, when he noted that his SpO2 went down to 45%.  He is normally on supplemental oxygen  at 2-2.5 LPM via nasal cannula.  He does titrate as needed to maintain SpO2 90%.  However, he noted that despite turning his supplemental oxygen  up to 6 LPM (which he typically needs during ambulation), he was not recovering.  He denies cough.  He denies pleuritic pain.  He denies sputum production.  He denies chest pain.  He does state that when he was profoundly hypoxic he did notice that he was experiencing blurring of vision.  He has noted some modest increased swelling in his lower extremities.  He does confess that he is prescribed daily furosemide  but is inconsistent with adherence to prescribed therapy.   Patient says he has had a very stressful and very active week, which revolves around having to get to multiple medical appointments.  He had an iron infusion a few days  ago.  He also had a Rituxan  infusion earlier this week.  Patient says he has been having blood in his stools, although he says that this is not any more than usual.  This has been a chronic problem ever since his diagnosis of Wegener's granulomatosis.     Subjective still feels terrible. Can tolerate almost no activity   Objective    BP 113/76 (BP Location: Left Arm)   Pulse 100   Temp 98.2 F (36.8 C) (Oral)   Resp 20   Ht 5' 10 (1.778 m)   Wt 96.8 kg   SpO2 92%   BMI 30.62 kg/m   Intake/Output Summary (Last 24 hours) at 02/12/2023 1036 Last data filed at 02/12/2023 9147 Gross per 24 hour  Intake 600 ml  Output 1975 ml  Net -1375 ml     General 78 year old male sitting up in chair. Has marked increased wob HENT NCAT no JVD Pulm diffuse rales. 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 Don't see documented desaturation  Neg 6.5 liters Bubble study neg. EF good  Plan He is s/p high dose steroids 125 mg bid x 7 days and reduced to 125 mg daily in 2/7 Will change to 40mg  bid for tomorrow, he is going to require slow taper I suspect Continue cellcept , may need to go up on dose or switch to cyclophosphamide  if continues to languish  CXR today, also check BNP, repeat sed rate and ck Procal Cont  to wean o2 Encourage PO intake Cont lasix  as BP/BUN and cr allow Goal is to get to 2 lpm prior to dc  Cont dapsone  for PCP prophylaxis  May need to consider palliative eval if no improvement   Dr Theophilus to follow.

## 2023-02-12 NOTE — Plan of Care (Signed)
  Problem: Nutritional: Goal: Maintenance of adequate nutrition will improve Outcome: Progressing   Problem: Education: Goal: Knowledge of General Education information will improve Description: Including pain rating scale, medication(s)/side effects and non-pharmacologic comfort measures Outcome: Progressing   Problem: Health Behavior/Discharge Planning: Goal: Ability to manage health-related needs will improve Outcome: Progressing   Problem: Clinical Measurements: Goal: Diagnostic test results will improve Outcome: Progressing Goal: Respiratory complications will improve Outcome: Progressing   Problem: Activity: Goal: Risk for activity intolerance will decrease Outcome: Progressing   Problem: Coping: Goal: Level of anxiety will decrease Outcome: Progressing   Problem: Pain Managment: Goal: General experience of comfort will improve and/or be controlled Outcome: Progressing

## 2023-02-12 NOTE — Plan of Care (Signed)

## 2023-02-13 DIAGNOSIS — J9621 Acute and chronic respiratory failure with hypoxia: Secondary | ICD-10-CM | POA: Diagnosis not present

## 2023-02-13 LAB — CBC WITH DIFFERENTIAL/PLATELET
Abs Immature Granulocytes: 0.24 10*3/uL — ABNORMAL HIGH (ref 0.00–0.07)
Basophils Absolute: 0 10*3/uL (ref 0.0–0.1)
Basophils Relative: 0 %
Eosinophils Absolute: 0 10*3/uL (ref 0.0–0.5)
Eosinophils Relative: 0 %
HCT: 28.9 % — ABNORMAL LOW (ref 39.0–52.0)
Hemoglobin: 8.6 g/dL — ABNORMAL LOW (ref 13.0–17.0)
Immature Granulocytes: 2 %
Lymphocytes Relative: 7 %
Lymphs Abs: 0.7 10*3/uL (ref 0.7–4.0)
MCH: 26.6 pg (ref 26.0–34.0)
MCHC: 29.8 g/dL — ABNORMAL LOW (ref 30.0–36.0)
MCV: 89.5 fL (ref 80.0–100.0)
Monocytes Absolute: 0.3 10*3/uL (ref 0.1–1.0)
Monocytes Relative: 3 %
Neutro Abs: 8.7 10*3/uL — ABNORMAL HIGH (ref 1.7–7.7)
Neutrophils Relative %: 88 %
Platelets: 314 10*3/uL (ref 150–400)
RBC: 3.23 MIL/uL — ABNORMAL LOW (ref 4.22–5.81)
RDW: 25.8 % — ABNORMAL HIGH (ref 11.5–15.5)
Smear Review: NORMAL
WBC: 9.9 10*3/uL (ref 4.0–10.5)
nRBC: 2 % — ABNORMAL HIGH (ref 0.0–0.2)

## 2023-02-13 LAB — BASIC METABOLIC PANEL
Anion gap: 11 (ref 5–15)
BUN: 38 mg/dL — ABNORMAL HIGH (ref 8–23)
CO2: 31 mmol/L (ref 22–32)
Calcium: 9.6 mg/dL (ref 8.9–10.3)
Chloride: 99 mmol/L (ref 98–111)
Creatinine, Ser: 0.86 mg/dL (ref 0.61–1.24)
GFR, Estimated: >60 mL/min (ref >60)
Glucose, Bld: 151 mg/dL — ABNORMAL HIGH (ref 70–99)
Potassium: 4.3 mmol/L (ref 3.5–5.1)
Sodium: 141 mmol/L (ref 135–145)

## 2023-02-13 LAB — PROCALCITONIN: Procalcitonin: 0.54 ng/mL

## 2023-02-13 LAB — BRAIN NATRIURETIC PEPTIDE: B Natriuretic Peptide: 226.7 pg/mL — ABNORMAL HIGH (ref 0.0–100.0)

## 2023-02-13 LAB — GLUCOSE, CAPILLARY
Glucose-Capillary: 107 mg/dL — ABNORMAL HIGH (ref 70–99)
Glucose-Capillary: 350 mg/dL — ABNORMAL HIGH (ref 70–99)
Glucose-Capillary: 238 mg/dL — ABNORMAL HIGH (ref 70–99)
Glucose-Capillary: 123 mg/dL — ABNORMAL HIGH (ref 70–99)

## 2023-02-13 LAB — PHOSPHORUS: Phosphorus: 2.6 mg/dL (ref 2.5–4.6)

## 2023-02-13 LAB — MAGNESIUM: Magnesium: 2.3 mg/dL (ref 1.7–2.4)

## 2023-02-13 LAB — C-REACTIVE PROTEIN: CRP: 11.1 mg/dL — ABNORMAL HIGH (ref <1.0)

## 2023-02-13 MED ORDER — INSULIN ASPART 100 UNIT/ML IJ SOLN
0.0000 [IU] | Freq: Every day | INTRAMUSCULAR | Status: DC
  Administered 2023-02-14 – 2023-02-18 (×3): 3 [IU] via SUBCUTANEOUS

## 2023-02-13 MED ORDER — INSULIN ASPART 100 UNIT/ML IJ SOLN
0.0000 [IU] | Freq: Three times a day (TID) | INTRAMUSCULAR | Status: DC
  Administered 2023-02-13: 15 [IU] via SUBCUTANEOUS
  Administered 2023-02-13: 7 [IU] via SUBCUTANEOUS
  Administered 2023-02-14: 3 [IU] via SUBCUTANEOUS
  Administered 2023-02-14: 7 [IU] via SUBCUTANEOUS
  Administered 2023-02-15: 3 [IU] via SUBCUTANEOUS
  Administered 2023-02-15 (×2): 4 [IU] via SUBCUTANEOUS
  Administered 2023-02-16 – 2023-02-17 (×3): 7 [IU] via SUBCUTANEOUS
  Administered 2023-02-17: 4 [IU] via SUBCUTANEOUS
  Administered 2023-02-18: 7 [IU] via SUBCUTANEOUS
  Administered 2023-02-19 (×2): 4 [IU] via SUBCUTANEOUS
  Administered 2023-02-19: 3 [IU] via SUBCUTANEOUS
  Administered 2023-02-20: 4 [IU] via SUBCUTANEOUS

## 2023-02-13 MED ORDER — MAGNESIUM SULFATE IN D5W 1-5 GM/100ML-% IV SOLN
1.0000 g | Freq: Once | INTRAVENOUS | Status: AC
  Administered 2023-02-13: 1 g via INTRAVENOUS
  Filled 2023-02-13: qty 100

## 2023-02-13 MED ORDER — INSULIN GLARGINE-YFGN 100 UNIT/ML ~~LOC~~ SOLN
12.0000 [IU] | Freq: Every day | SUBCUTANEOUS | Status: DC
  Administered 2023-02-13 – 2023-02-19 (×7): 12 [IU] via SUBCUTANEOUS
  Filled 2023-02-13 (×8): qty 0.12

## 2023-02-13 MED ORDER — PREDNISONE 5 MG PO TABS
30.0000 mg | ORAL_TABLET | Freq: Two times a day (BID) | ORAL | Status: DC
  Administered 2023-02-13 – 2023-02-18 (×10): 30 mg via ORAL
  Filled 2023-02-13: qty 2
  Filled 2023-02-13: qty 1
  Filled 2023-02-13 (×9): qty 2

## 2023-02-13 NOTE — Progress Notes (Signed)
 Mobility Specialist: Progress Note   02/13/23 1306  Mobility  Activity Stood at bedside  Level of Assistance Standby assist, set-up cues, supervision of patient - no hands on  Assistive Device Front wheel walker  Range of Motion/Exercises Active;Right leg;Left leg  Activity Response Tolerated well  Mobility Referral Yes  Mobility visit 1 Mobility  Mobility Specialist Start Time (ACUTE ONLY) 0935  Mobility Specialist Stop Time (ACUTE ONLY) 0951  Mobility Specialist Time Calculation (min) (ACUTE ONLY) 16 min    Pt was agreeable to mobility session - received in chair on 5LO2 seemingly SOB. Completed 1 set of STS + 10 marches. Declined further ambulation d/t fatigue. Pt titrated to 10LO2 to maintain SpO2 goal of 88%. Able to taper back down to 5LO2 at EOS, SpO2 88-89%. Left in chair with all needs met, call bell in reach.   Ileana Lute Mobility Specialist Please contact via SecureChat or Rehab office at [phone]

## 2023-02-13 NOTE — Progress Notes (Signed)
 PROGRESS NOTE        PATIENT DETAILS Name: Gary [name] Age: 78 y.o. Sex: male Date of Birth: 10-13-1945 Admit Date: 02/03/2023 Admitting Physician Lavada MARLA Stank, MD ERE:Dxjxoz, Massie, DO  Brief Summary: Patient is a 78 y.o.  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 2/06>> Echo:EF 60-65%, agitated saline contrast bubble study negative-normal evidence of interatrial shunt.  Significant microbiology data: 1/30>> respiratory virus panel: Negative 1/30>> COVID/influenza/RSV PCR: Negative  Procedures: None  Consults: PCCM  Subjective: Patient in bed, appears comfortable, denies any headache, no fever, no chest pain or pressure, improving shortness of breath , no abdominal pain. No focal weakness. Objective: Vitals: Blood pressure 115/63, pulse 84, temperature 98 F (36.7 C), temperature source Oral, resp. rate (!) 21, height 5' 10 (1.778 m), weight 96.8 kg, SpO2 90%.   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 Although hypoxia slowly improving-he continues to have severe exertional dyspnea with minimal activity with significant drops in saturation-with slow but gradual recovery with rest.  He seems a bit more fatigued/tired today. Remains on CellCept /Solu-Medrol  now transition to oral prednisone  As needed Lasix  Echo without cardiac shunt-stable EF 60%. Continue attempts to slowly titrate down FiO2. PCCM continues to follow  SpO2: 90 % O2 Flow Rate (L/min): 4 L/min FiO2 (%): 40 %    History of granulomatosis with polyangiitis (GPA) Was on tapering steroids (down to 40 mg)-when he developed worsening respiratory failure Prophylactic dapsone  for PJP (no Bactrim  as interaction with Tikosyn ) PCCM started CellCept -no longer on Rituxan  Currently on IV Solu-Medrol -dose reduced from twice daily to daily on 2/7.  Paroxysmal atrial fibrillation Maintaining sinus rhythm Tikosyn /Coreg  Has been transitioned to Eliquis -no plans to restart Coumadin  on discharge.  Chronic HFpEF Euvolemic-on Lasix  to ensure negative balance.  History of CAD s/p PCI September 2023 Aspirin  No longer on Plavix -this was also not on his most recent discharge list of medications.  Iron deficiency anemia Appreciate GI input-plans are for outpatient endoscopy evaluation once his respiratory status improves-No overt bleeding noted-some drop in Hb but no obvious evidence of GI bleed.  Given IV iron in 2/4-follow CBC  Hypothyroidism Synthroid   DM-2 with steroid related hyperglycemia CBGs continue to fluctuate-elevated yesterday-borderline hypoglycemic today Decrease Semglee  to 10 units Continue SSI Follow-up with   Recent Labs    02/12/23 1648 02/12/23 2130 02/13/23 0805  GLUCAP 333* 277* 107*    Mood disorder Stable Continue Effexor    BMI: Estimated body mass index is 30.62 kg/m as calculated from the following:  Height as of this encounter: 5' 10 (1.778 m).   Weight as of this encounter: 96.8 kg.    Code status:   Code Status: Full Code   DVT Prophylaxis: SCDs Start: 02/03/23 1523 apixaban  (ELIQUIS ) tablet 5 mg    Family Communication:Spouse [name]-[phone] on 2/4   Disposition Plan: Status is: Inpatient Remains inpatient appropriate because: Severity of illness   Planned Discharge Destination:Home health   Diet: Diet Order             Diet regular Fluid consistency: Thin  Diet effective now                     Antimicrobial agents: Anti-infectives (From admission, onward)    Start     Dose/Rate Route Frequency Ordered Stop   02/03/23 1445  dapsone  tablet 100 mg        100 mg Oral Daily 02/03/23 1442          MEDICATIONS: Scheduled Meds:  ALPRAZolam   0.5 mg Oral QHS   apixaban   5 mg Oral BID   aspirin   81 mg Oral Daily   carvedilol   3.125 mg Oral BID WC   dapsone   100 mg Oral Daily   dofetilide   500 mcg Oral BID   dronabinol   5 mg Oral QAC lunch   feeding supplement (GLUCERNA SHAKE)  237 mL Oral BID BM   furosemide   40 mg Intravenous Daily   insulin  aspart  0-20 Units Subcutaneous TID WC   insulin  aspart  0-5 Units Subcutaneous QHS   insulin  aspart  4 Units Subcutaneous TID WC   insulin  glargine-yfgn  12 Units Subcutaneous Daily   levothyroxine   137 mcg Oral Q0600   mycophenolate   500 mg Oral BID   pantoprazole   40 mg Oral BID   predniSONE   40 mg Oral BID WC   venlafaxine  XR  150 mg Oral Daily   Continuous Infusions:   PRN Meds:.acetaminophen  **OR** acetaminophen , bisacodyl , chlorpheniramine-HYDROcodone , metoprolol  tartrate, nitroGLYCERIN , mouth rinse, promethazine    I have personally reviewed following labs and imaging studies  LABORATORY DATA:   Data Review:    Recent Labs  Lab 02/07/23 0509 02/08/23 0453 02/10/23 0450 02/11/23 0527 02/12/23 0506 02/13/23 0544  WBC 12.6* 14.0* 10.2 11.7* 12.7* 9.9  HGB 8.4* 9.3* 7.7* 7.8* 8.6* 8.6*  HCT 29.4* 32.2* 26.6* 27.5* 29.8* 28.9*  PLT 376 389 302 306 322 314  MCV 87.2 87.0  88.1 89.6 90.6 89.5  MCH 24.9* 25.1* 25.5* 25.4* 26.1 26.6  MCHC 28.6* 28.9* 28.9* 28.4* 28.9* 29.8*  RDW 24.2* 25.1* 25.2* 25.5* 26.1* 25.8*  LYMPHSABS 0.7 0.7  --   --   --  0.7  MONOABS 0.6 0.6  --   --   --  0.3  EOSABS 0.0 0.0  --   --   --  0.0  BASOSABS 0.0 0.0  --   --   --  0.0    Recent Labs  Lab 02/07/23 0509 02/08/23 0453 02/09/23 0440 02/09/23 1433 02/10/23 0856 02/11/23 0527 02/12/23 0506 02/12/23 1133 02/13/23 0544 02/13/23 0748  NA 142 142 139  --  140 140 139  --  141  --   K 4.4 4.0 4.1  --  3.6 4.5 4.0  --  4.3  --   CL 107 102 101  --  101 101 99  --  99  --   CO2 25 27 27   --  24 29 30   --  31  --  ANIONGAP 10 13 11   --  15 10 10   --  11  --   GLUCOSE 152* 148* 166*  --  309* 199* 75  --  151*  --   BUN 28* 24* 22  --  42* 40* 37*  --  38*  --   CREATININE 1.01 0.84 0.88  --  1.05 0.78 0.91  --  0.86  --   CRP 3.5*  --   --  12.4*  --   --   --   --  11.1*  --   PROCALCITON 0.19 0.28  --  0.24  --   --   --  0.50 0.54  --   INR 1.2  --   --   --   --   --   --   --   --   --   BNP 327.0*  --   --   --   --   --   --  183.8*  --  226.7*  MG 2.3  --   --   --   --   --   --   --  2.3  --   PHOS 3.6  --   --   --   --   --   --   --  2.6  --   CALCIUM  9.3 9.3 9.4  --  9.7 9.4 9.5  --  9.6  --       Recent Labs  Lab 02/07/23 0509 02/08/23 0453 02/09/23 0440 02/09/23 1433 02/10/23 0856 02/11/23 0527 02/12/23 0506 02/12/23 1133 02/13/23 0544 02/13/23 0748  CRP 3.5*  --   --  12.4*  --   --   --   --  11.1*  --   PROCALCITON 0.19 0.28  --  0.24  --   --   --  0.50 0.54  --   INR 1.2  --   --   --   --   --   --   --   --   --   BNP 327.0*  --   --   --   --   --   --  183.8*  --  226.7*  MG 2.3  --   --   --   --   --   --   --  2.3  --   CALCIUM  9.3 9.3 9.4  --  9.7 9.4 9.5  --  9.6  --     --------------------------------------------------------------------------------------------------------------- Lab Results  Component Value Date    CHOL 155 05/26/2020   HDL 45 05/26/2020   LDLCALC 79 05/26/2020   TRIG 184 (H) 05/26/2020   CHOLHDL 3.4 05/26/2020    Lab Results  Component Value Date   HGBA1C 5.9 (H) 01/02/2023   Radiology Reports DG Chest Port 1 View Result Date: 02/12/2023 CLINICAL DATA:  Interstitial lung disease EXAM: PORTABLE CHEST 1 VIEW COMPARISON:  02/09/2023 FINDINGS: Single frontal view of the chest demonstrates postsurgical changes from CABG. Cardiac silhouette is enlarged but stable. Diffuse interstitial lung disease is again identified, with bilateral fibrotic changes and scattered ground-glass opacities most pronounced in the right upper and left lower lung zones similar to prior CT exam. No new consolidation, effusion, or pneumothorax. No acute bony abnormalities. IMPRESSION: 1. Stable findings of chronic interstitial lung disease, with fibrosis and ground-glass opacities unchanged since recent high-resolution chest CT 01/07/2023. 2. No evidence of acute intrathoracic process. Electronically Signed   By: Ozell  Delores M.D.   On: 02/12/2023 15:32   ECHOCARDIOGRAM LIMITED BUBBLE STUDY Result Date: 02/10/2023    ECHOCARDIOGRAM LIMITED REPORT   Patient Name:   GIOVANY COSBY Date of Exam: 02/10/2023 Medical Rec #:  [id number]         Height:       70.0 in Accession #:    [id number]        Weight:       201.7 lb Date of Birth:  Jul 11, 1945          BSA:          2.095 m Patient Age:    77 years          BP:           149/81 mmHg Patient Gender: M                 HR:           75 bpm. Exam Location:  Inpatient Procedure: Limited Echo, Cardiac Doppler, Color Doppler and Saline Contrast            Bubble Study Indications:    ASD  History:        Patient has prior history of Echocardiogram examinations, most                 recent 12/07/2022. CAD; Risk Factors:Hypertension, Diabetes,                 Dyslipidemia, Former Smoker and Sleep Apnea.  Sonographer:    Ozell Free Referring Phys: [id number] TORIBIO BROCKS SMITH IMPRESSIONS  1.  Left ventricular ejection fraction, by estimation, is 60 to 65%. The left ventricle has normal function. The left ventricle has no regional wall motion abnormalities. There is moderate concentric left ventricular hypertrophy. Left ventricular diastolic function could not be evaluated.  2. Right ventricular systolic function is normal. The right ventricular size is normal. There is moderately elevated pulmonary artery systolic pressure.  3. The mitral valve is degenerative. Mild mitral valve regurgitation. No evidence of mitral stenosis. Moderate mitral annular calcification.  4. The aortic valve is calcified. There is mild calcification of the aortic valve. Aortic valve regurgitation is not visualized. Aortic valve sclerosis/calcification is present, without any evidence of aortic stenosis.  5. The inferior vena cava is normal in size with greater than 50% respiratory variability, suggesting right atrial pressure of 3 mmHg.  6. Agitated saline contrast bubble study was negative, with no evidence of any interatrial shunt. FINDINGS  Left Ventricle: Left ventricular ejection fraction, by estimation, is 60 to 65%. The left ventricle has normal function. The left ventricle has no regional wall motion abnormalities. The left ventricular internal cavity size was normal in size. There is  moderate concentric left ventricular hypertrophy. Left ventricular diastolic function could not be evaluated. Left ventricular diastolic function could not be evaluated due to mitral annular calcification (moderate or greater). Right Ventricle: The right ventricular size is normal. No increase in right ventricular wall thickness. Right ventricular systolic function is normal. There is moderately elevated pulmonary artery systolic pressure. The tricuspid regurgitant velocity is 3.58 m/s, and with an assumed right atrial pressure of 3 mmHg, the estimated right ventricular systolic pressure is 54.3 mmHg. Left Atrium: Left atrial size was not  assessed. Right Atrium: Right atrial size was not assessed. Pericardium: There is no evidence of pericardial effusion. Mitral Valve: The mitral valve is degenerative in appearance. Moderate mitral annular calcification. Mild mitral valve regurgitation. No evidence of  mitral valve stenosis. Tricuspid Valve: The tricuspid valve is normal in structure. Tricuspid valve regurgitation is mild . No evidence of tricuspid stenosis. Aortic Valve: The aortic valve is calcified. There is mild calcification of the aortic valve. Aortic valve regurgitation is not visualized. Aortic valve sclerosis/calcification is present, without any evidence of aortic stenosis. Pulmonic Valve: The pulmonic valve was not well visualized. Pulmonic valve regurgitation is not visualized. No evidence of pulmonic stenosis. Aorta: Aortic root could not be assessed. Venous: The inferior vena cava is normal in size with greater than 50% respiratory variability, suggesting right atrial pressure of 3 mmHg. IAS/Shunts: No atrial level shunt detected by color flow Doppler. Agitated saline contrast was given intravenously to evaluate for intracardiac shunting. Agitated saline contrast bubble study was negative, with no evidence of any interatrial shunt. Additional Comments: Spectral Doppler performed. Color Doppler performed.  TRICUSPID VALVE TR Peak grad:   51.3 mmHg TR Vmax:        358.00 cm/s Dub Tobb DO Electronically signed by Dub Huntsman DO Signature Date/Time: 02/10/2023/12:47:16 PM    Final    DG Chest Port 1 View Result Date: 02/09/2023 CLINICAL DATA:  Respiratory failure. EXAM: PORTABLE CHEST 1 VIEW COMPARISON:  02/03/2023 FINDINGS: Low volume film. The cardio pericardial silhouette is enlarged. Diffuse interstitial opacity again noted, progressive in the parahilar left mid lung. No substantial pleural effusion. No acute bony abnormality. Telemetry leads overlie the chest. IMPRESSION: Low volume film with progressive interstitial opacity in the  parahilar left mid lung. Imaging features could be related to asymmetric edema or infection. Electronically Signed   By: Camellia Candle M.D.   On: 02/09/2023 08:54   DG Chest Portable 1 View Result Date: 02/03/2023 CLINICAL DATA:  Shortness of breath. Hypoxia. History of Wegener's and interstitial lung disease. EXAM: PORTABLE CHEST 1 VIEW COMPARISON:  Radiographs 01/05/2023 and 01/01/2023.  CT 01/07/2023. FINDINGS: 1252 hours. The heart size and mediastinal contours are stable status post median sternotomy and CABG. Chronic interstitial lung disease again noted with possible slight worsening on the right which could indicate superimposed edema or inflammation. There is no consolidation, pneumothorax or significant pleural effusion. The bones appear unchanged. IMPRESSION: Chronic interstitial lung disease with possible slight worsening on the right which could indicate superimposed edema or inflammation. Electronically Signed   By: Elsie Perone M.D.   On: 02/03/2023 14:01   CT Chest High Resolution Result Date: 01/07/2023 CLINICAL DATA:  Inpatient. Diffuse/interstitial lung disease ild flare EXAM: CT CHEST WITHOUT CONTRAST TECHNIQUE: Multidetector CT imaging of the chest was performed following the standard protocol without intravenous contrast. High resolution imaging of the lungs, as well as inspiratory and expiratory imaging, was performed. RADIATION DOSE REDUCTION: This exam was performed according to the departmental dose-optimization program which includes automated exposure control, adjustment of the mA and/or kV according to patient size and/or use of iterative reconstruction technique. COMPARISON:  01/05/2023 chest radiograph. 11/05/2022 high-resolution chest CT. FINDINGS: Cardiovascular: Stable mild cardiomegaly. No significant pericardial effusion/thickening. Three-vessel coronary atherosclerosis status post CABG. Atherosclerotic nonaneurysmal thoracic aorta. Normal caliber pulmonary arteries.  Mediastinum/Nodes: No significant thyroid  nodules. Unremarkable esophagus. No axillary adenopathy. Mild right paratracheal adenopathy up to 1.2 cm (series 5/image 45), unchanged. No new pathologically enlarged mediastinal nodes. No discrete hilar adenopathy on these noncontrast images. Lungs/Pleura: No pneumothorax. No pleural effusion. No acute consolidative airspace disease, lung masses or significant pulmonary nodules. Stable mild patchy air trapping in both lungs on the expiration sequence, without evidence of tracheobronchomalacia. Extensive patchy peribronchovascular and subpleural reticulation  and ground-glass opacity throughout both lungs with associated mild traction bronchiectasis and architectural distortion. No clear apicobasilar gradient to these findings. Scattered foci of mild honeycombing in both lungs, for example in the peripheral right lung on series 8/image 80 and in the peripheral lower left lung on series 8/image 98). Ground-glass opacities have worsened since 11/05/2022 chest CT. Findings are otherwise not appreciably changed. Upper abdomen: Cholelithiasis. Musculoskeletal: No aggressive appearing focal osseous lesions. Intact sternotomy wires. Moderate thoracic spondylosis. IMPRESSION: 1. Spectrum of findings compatible with fibrotic interstitial lung disease with mild honeycombing, with worsened diffuse patchy ground-glass opacity in the interval since 11/05/2022 CT. Acute flare of chronic fibrotic interstitial lung disease favored. As previously described, chronic hypersensitivity pneumonitis or fibrotic NSIP are favored. Findings are suggestive of an alternative diagnosis (not UIP) per consensus guidelines: Diagnosis of Idiopathic Pulmonary Fibrosis: An Official ATS/ERS/JRS/ALAT Clinical Practice Guideline. Am JINNY [employer] Crit Care Med Vol 198, Iss 5, [phone], Sep 04 2016. 2. Stable mild cardiomegaly. 3. Cholelithiasis. 4.  Aortic Atherosclerosis (ICD10-I70.0). Electronically Signed   By:  Selinda DELENA Blue M.D.   On: 01/07/2023 10:28   CARDIAC CATHETERIZATION Result Date: 01/06/2023 1. Normal filling pressures. 2. Mild pulmonary hypertension without elevated PVR. 3. Normal cardiac output.   VAS US  LOWER EXTREMITY VENOUS (DVT) Result Date: 01/05/2023  Lower Venous DVT Study Patient Name:  ROMUALD MCCASLIN [facility]  Date of Exam:   01/05/2023 Medical Rec #: [id number]          Accession #:    [id number] Date of Birth: 1945/03/04           Patient Gender: M Patient Age:   43 years Exam Location:  [facility] Procedure:      VAS US  LOWER EXTREMITY VENOUS (DVT) Referring Phys: TORIBIO SHARPS --------------------------------------------------------------------------------  Indications: Edema.  Anticoagulation: Coumadin . Limitations: Poor ultrasound/tissue interface. Performing Technologist: Ezzie Potters RVT, RDMS  Examination Guidelines: A complete evaluation includes B-mode imaging, spectral Doppler, color Doppler, and power Doppler as needed of all accessible portions of each vessel. Bilateral testing is considered an integral part of a complete examination. Limited examinations for reoccurring indications may be performed as noted. The reflux portion of the exam is performed with the patient in reverse Trendelenburg.  +--------+---------------+---------+-----------+----------+--------------------+ RIGHT   CompressibilityPhasicitySpontaneityPropertiesThrombus Aging       +--------+---------------+---------+-----------+----------+--------------------+ CFV     Full           Yes      Yes                                       +--------+---------------+---------+-----------+----------+--------------------+ SFJ     Full                                                              +--------+---------------+---------+-----------+----------+--------------------+ FV Prox Full           Yes      Yes                                        +--------+---------------+---------+-----------+----------+--------------------+ FV Mid  Full  Yes      Yes                                       +--------+---------------+---------+-----------+----------+--------------------+ FV                     Yes      Yes                  patent by            Distal                                               color/doppler        +--------+---------------+---------+-----------+----------+--------------------+ PFV     Full                                                              +--------+---------------+---------+-----------+----------+--------------------+ POP     Full           Yes      Yes                                       +--------+---------------+---------+-----------+----------+--------------------+ PTV     Full                                                              +--------+---------------+---------+-----------+----------+--------------------+ PERO    Full                                                              +--------+---------------+---------+-----------+----------+--------------------+   +--------+---------------+---------+-----------+----------+--------------------+ LEFT    CompressibilityPhasicitySpontaneityPropertiesThrombus Aging       +--------+---------------+---------+-----------+----------+--------------------+ CFV     Full           Yes      Yes                                       +--------+---------------+---------+-----------+----------+--------------------+ SFJ     Full                                                              +--------+---------------+---------+-----------+----------+--------------------+ FV Prox Full           Yes      Yes                                       +--------+---------------+---------+-----------+----------+--------------------+  FV Mid  Full           Yes      Yes                                        +--------+---------------+---------+-----------+----------+--------------------+ FV                     Yes      Yes                  patent by            Distal                                               color/doppler        +--------+---------------+---------+-----------+----------+--------------------+ PFV     Full                                                              +--------+---------------+---------+-----------+----------+--------------------+ POP     Full           Yes      Yes                                       +--------+---------------+---------+-----------+----------+--------------------+ PTV     Full                                                              +--------+---------------+---------+-----------+----------+--------------------+ PERO    Full                                                              +--------+---------------+---------+-----------+----------+--------------------+     Summary: BILATERAL: - No evidence of deep vein thrombosis seen in the lower extremities, bilaterally. -No evidence of popliteal cyst, bilaterally.   *See table(s) above for measurements and observations. Electronically signed by Penne Colorado MD on 01/05/2023 at 5:34:57 PM.    Final    DG Chest Port 1 View Result Date: 01/05/2023 CLINICAL DATA:  Acute respiratory failure. History of hypertension and coronary artery disease. EXAM: PORTABLE CHEST 1 VIEW COMPARISON:  01/01/2023 FINDINGS: Previous median sternotomy and CABG procedure. Lung volumes are low. Diffuse increased reticular and nodular interstitial opacities within both lungs compatible with previously characterized chronic fibrotic no superimposed pleural effusion, interstitial edema or airspace consolidation. Interstitial lung disease IMPRESSION: 1. No acute findings. 2. Chronic interstitial lung disease. Electronically Signed   By: Waddell Calk M.D.   On: 01/05/2023 12:24   DG Chest Port 1  View Result Date: 01/01/2023 CLINICAL DATA:  Shortness of  breath. EXAM: PORTABLE CHEST 1 VIEW COMPARISON:  09/17/2022. FINDINGS: Low lung volume. There are diffuse increased interstitial markings, less pronounced than the prior radiograph from 10/04/2022, which corresponds to chronic interstitial lung disease on recent chest CT scan from 11/05/2022. Bilateral lung fields are otherwise clear. No acute consolidation or lung collapse. Bilateral costophrenic angles are clear. Stable cardio-mediastinal silhouette. Sternotomy wires noted, status post CABG. No acute osseous abnormalities. The soft tissues are within normal limits. IMPRESSION: *No acute cardiopulmonary abnormality.  Interstitial lung disease. Electronically Signed   By: Ree Molt M.D.   On: 01/01/2023 17:19   ECHOCARDIOGRAM COMPLETE Result Date: 12/07/2022    ECHOCARDIOGRAM REPORT   Patient Name:   REEVES MUSICK Date of Exam: 12/07/2022 Medical Rec #:  [id number]         Height:       68.3 in Accession #:    [id number]        Weight:       221.0 lb Date of Birth:  1945-08-29          BSA:          2.139 m Patient Age:    77 years          BP:           128/80 mmHg Patient Gender: M                 HR:           81 bpm. Exam Location:  [address] Procedure: 2D Echo, 3D Echo, Cardiac Doppler and Color Doppler Indications:    R06.00 Dyspnea  History:        Patient has prior history of Echocardiogram examinations, most                 recent 03/02/2022. CHF, CAD, Prior CABG, Arrythmias:Atrial                 Fibrillation, Signs/Symptoms:Dyspnea; Risk Factors:Hypertension,                 Dyslipidemia, Former Smoker, Family History of Coronary Artery                 Disease and Sleep Apnea. Interstitial Lung Disease, Wegener's                 Granulomatosis.  Sonographer:    Heather Hawks RDCS Referring Phys: DORETHIA CAVE IMPRESSIONS  1. Left ventricular ejection fraction, by estimation, is 60 to 65%. The left ventricle has normal function.  The left ventricle has no regional wall motion abnormalities. Left ventricular diastolic parameters are consistent with Grade I diastolic dysfunction (impaired relaxation).  2. Mid systolic notching of the pulmonary artery pressure tracing consistent with pulmonary hypertension. RVSP estimated at . . Right ventricular systolic function is normal. The right ventricular size is mildly enlarged.  3. Left atrial size was moderately dilated.  4. Right atrial size was severely dilated.  5. The mitral valve is normal in structure. Trivial mitral valve regurgitation. No evidence of mitral stenosis. Moderate mitral annular calcification.  6. The aortic valve is normal in structure. Aortic valve regurgitation is not visualized. No aortic stenosis is present.  7. Aortic dilatation noted. There is mild dilatation of the ascending aorta, measuring 41 mm.  8. The inferior vena cava is normal in size with greater than 50% respiratory variability, suggesting right atrial pressure of 3 mmHg. FINDINGS  Left Ventricle: Left ventricular ejection fraction, by estimation, is 60 to  65%. The left ventricle has normal function. The left ventricle has no regional wall motion abnormalities. The left ventricular internal cavity size was normal in size. There is  no left ventricular hypertrophy. Left ventricular diastolic parameters are consistent with Grade I diastolic dysfunction (impaired relaxation). Right Ventricle: Mid systolic notching of the pulmonary artery pressure tracing consistent with pulmonary hypertension. RVSP estimated at . The right ventricular size is mildly enlarged. No increase in right ventricular wall thickness. Right ventricular systolic function is normal. Left Atrium: Left atrial size was moderately dilated. Right Atrium: Right atrial size was severely dilated. Pericardium: There is no evidence of pericardial effusion. Mitral Valve: The mitral valve is normal in structure. Moderate mitral annular  calcification. Trivial mitral valve regurgitation. No evidence of mitral valve stenosis. MV peak gradient, 5.7 mmHg. The mean mitral valve gradient is 2.0 mmHg. Tricuspid Valve: The tricuspid valve is normal in structure. Tricuspid valve regurgitation is not demonstrated. No evidence of tricuspid stenosis. Aortic Valve: The aortic valve is normal in structure. Aortic valve regurgitation is not visualized. No aortic stenosis is present. Aortic valve mean gradient measures 7.0 mmHg. Aortic valve peak gradient measures 13.0 mmHg. Aortic valve area, by VTI measures 2.51 cm. Pulmonic Valve: The pulmonic valve was normal in structure. Pulmonic valve regurgitation is not visualized. No evidence of pulmonic stenosis. Aorta: Aortic dilatation noted and the aortic root is normal in size and structure. There is mild dilatation of the ascending aorta, measuring 41 mm. Venous: The inferior vena cava is normal in size with greater than 50% respiratory variability, suggesting right atrial pressure of 3 mmHg. IAS/Shunts: No atrial level shunt detected by color flow Doppler.  LEFT VENTRICLE PLAX 2D LVIDd:         5.40 cm   Diastology LVIDs:         3.60 cm   LV e' medial:    5.87 cm/s LV PW:         1.10 cm   LV E/e' medial:  16.0 LV IVS:        1.10 cm   LV e' lateral:   10.30 cm/s LVOT diam:     2.60 cm   LV E/e' lateral: 9.1 LV SV:         99 LV SV Index:   46 LVOT Area:     5.31 cm                           3D Volume EF:                          3D EF:        60 %                          LV EDV:       187 ml                          LV ESV:       75 ml                          LV SV:        113 ml RIGHT VENTRICLE RV Basal diam:  4.50 cm RV Mid diam:    3.50 cm RV S prime:     16.10 cm/s TAPSE (  M-mode): 2.3 cm RVSP:           59.2 mmHg LEFT ATRIUM              Index        RIGHT ATRIUM           Index LA diam:        5.30 cm  2.48 cm/m   RA Pressure: 3.00 mmHg LA Vol (A2C):   79.1 ml  36.99 ml/m  RA Area:     31.20 cm LA  Vol (A4C):   112.0 ml 52.37 ml/m  RA Volume:   122.00 ml 57.05 ml/m LA Biplane Vol: 98.6 ml  46.11 ml/m  AORTIC VALVE AV Area (Vmax):    2.48 cm AV Area (Vmean):   2.44 cm AV Area (VTI):     2.51 cm AV Vmax:           180.00 cm/s AV Vmean:          121.000 cm/s AV VTI:            0.393 m AV Peak Grad:      13.0 mmHg AV Mean Grad:      7.0 mmHg LVOT Vmax:         84.10 cm/s LVOT Vmean:        55.550 cm/s LVOT VTI:          0.186 m LVOT/AV VTI ratio: 0.47  AORTA Ao Root diam: 3.40 cm Ao Asc diam:  4.05 cm MITRAL VALVE               TRICUSPID VALVE MV Area (PHT): cm         TR Peak grad:   56.2 mmHg MV Area VTI:   2.87 cm    TR Vmax:        375.00 cm/s MV Peak grad:  5.7 mmHg    Estimated RAP:  3.00 mmHg MV Mean grad:  2.0 mmHg    RVSP:           59.2 mmHg MV Vmax:       1.19 m/s MV Vmean:      55.7 cm/s   SHUNTS MV Decel Time: 219 msec    Systemic VTI:  0.19 m MV E velocity: 94.10 cm/s  Systemic Diam: 2.60 cm MV A velocity: 67.15 cm/s MV E/A ratio:  1.40 Morene Brownie Electronically signed by Morene Brownie Signature Date/Time: 12/07/2022/4:32:10 PM    Final       Signature  -   Lavada Stank M.D on 02/13/2023 at 11:32 AM   -  To page go to [email or web address]

## 2023-02-13 NOTE — Plan of Care (Signed)
  Problem: Education: Goal: Knowledge of General Education information will improve Description: Including pain rating scale, medication(s)/side effects and non-pharmacologic comfort measures Outcome: Progressing   Problem: Clinical Measurements: Goal: Ability to maintain clinical measurements within normal limits will improve Outcome: Progressing Goal: Will remain free from infection Outcome: Progressing Goal: Diagnostic test results will improve Outcome: Progressing Goal: Respiratory complications will improve Outcome: Progressing   Problem: Coping: Goal: Level of anxiety will decrease Outcome: Progressing

## 2023-02-14 DIAGNOSIS — J9621 Acute and chronic respiratory failure with hypoxia: Secondary | ICD-10-CM | POA: Diagnosis not present

## 2023-02-14 LAB — CBC WITH DIFFERENTIAL/PLATELET
Abs Immature Granulocytes: 0.16 10*3/uL — ABNORMAL HIGH (ref 0.00–0.07)
Basophils Absolute: 0 10*3/uL (ref 0.0–0.1)
Basophils Relative: 0 %
Eosinophils Absolute: 0 10*3/uL (ref 0.0–0.5)
Eosinophils Relative: 0 %
HCT: 29.9 % — ABNORMAL LOW (ref 39.0–52.0)
Hemoglobin: 8.8 g/dL — ABNORMAL LOW (ref 13.0–17.0)
Immature Granulocytes: 2 %
Lymphocytes Relative: 5 %
Lymphs Abs: 0.4 10*3/uL — ABNORMAL LOW (ref 0.7–4.0)
MCH: 26.4 pg (ref 26.0–34.0)
MCHC: 29.4 g/dL — ABNORMAL LOW (ref 30.0–36.0)
MCV: 89.8 fL (ref 80.0–100.0)
Monocytes Absolute: 0.2 10*3/uL (ref 0.1–1.0)
Monocytes Relative: 2 %
Neutro Abs: 8.1 10*3/uL — ABNORMAL HIGH (ref 1.7–7.7)
Neutrophils Relative %: 91 %
Platelets: 310 10*3/uL (ref 150–400)
RBC: 3.33 MIL/uL — ABNORMAL LOW (ref 4.22–5.81)
RDW: 25.6 % — ABNORMAL HIGH (ref 11.5–15.5)
WBC: 8.9 10*3/uL (ref 4.0–10.5)
nRBC: 1.6 % — ABNORMAL HIGH (ref 0.0–0.2)

## 2023-02-14 LAB — BASIC METABOLIC PANEL
Anion gap: 17 — ABNORMAL HIGH (ref 5–15)
BUN: 29 mg/dL — ABNORMAL HIGH (ref 8–23)
CO2: 33 mmol/L — ABNORMAL HIGH (ref 22–32)
Calcium: 10.2 mg/dL (ref 8.9–10.3)
Chloride: 92 mmol/L — ABNORMAL LOW (ref 98–111)
Creatinine, Ser: 0.8 mg/dL (ref 0.61–1.24)
GFR, Estimated: >60 mL/min (ref >60)
Glucose, Bld: 96 mg/dL (ref 70–99)
Potassium: 4.2 mmol/L (ref 3.5–5.1)
Sodium: 142 mmol/L (ref 135–145)

## 2023-02-14 LAB — MAGNESIUM: Magnesium: 2.4 mg/dL (ref 1.7–2.4)

## 2023-02-14 LAB — GLUCOSE, CAPILLARY
Glucose-Capillary: 132 mg/dL — ABNORMAL HIGH (ref 70–99)
Glucose-Capillary: 110 mg/dL — ABNORMAL HIGH (ref 70–99)
Glucose-Capillary: 218 mg/dL — ABNORMAL HIGH (ref 70–99)
Glucose-Capillary: 283 mg/dL — ABNORMAL HIGH (ref 70–99)

## 2023-02-14 LAB — BRAIN NATRIURETIC PEPTIDE: B Natriuretic Peptide: 155 pg/mL — ABNORMAL HIGH (ref 0.0–100.0)

## 2023-02-14 LAB — C-REACTIVE PROTEIN: CRP: 12.7 mg/dL — ABNORMAL HIGH (ref <1.0)

## 2023-02-14 LAB — PHOSPHORUS: Phosphorus: 3.2 mg/dL (ref 2.5–4.6)

## 2023-02-14 LAB — PROCALCITONIN: Procalcitonin: 0.36 ng/mL

## 2023-02-14 MED ORDER — MYCOPHENOLATE MOFETIL 250 MG PO CAPS
1000.0000 mg | ORAL_CAPSULE | Freq: Two times a day (BID) | ORAL | Status: DC
  Administered 2023-02-14 – 2023-02-20 (×12): 1000 mg via ORAL
  Filled 2023-02-14 (×12): qty 4

## 2023-02-14 MED ORDER — FUROSEMIDE 10 MG/ML IJ SOLN
40.0000 mg | Freq: Once | INTRAMUSCULAR | Status: AC
  Administered 2023-02-14: 40 mg via INTRAVENOUS
  Filled 2023-02-14: qty 4

## 2023-02-14 NOTE — Progress Notes (Signed)
 02/14/2023    History of Present Illness:  This 78 year old Caucasian male is seen in consultation at the request of Dr. Lynnwood Sauer for recommendations on further evaluation and management of hypoxia.  The patient was sent to [facility] emergency department from [facility] pulmonary clinic today, after he presented with reports of having profound hypoxia at home (down to SpO2 45%).  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  He is on rituximab  and steroids.  In fact, he was recently hospitalized for interstitial lung disease flareup and has been on a prednisone  taper, currently on prednisone  40 mg p.o. daily.  Treated with high-dose steroids.  Right heart cath on 01/04/2023 showed normal filling pressures, mild pulmonary hypertension and preserved cardiac output.  He reported doing well after his recent hospitalization and felt that he had a very good day yesterday.  However, this morning he woke up and experienced extreme exertional dyspnea while getting from his bed to the bathroom, when he noted that his SpO2 went down to 45%.  He is normally on supplemental oxygen  at 2-2.5 LPM via nasal cannula.  He does titrate as needed to maintain SpO2 90%.  However, he noted that despite turning his supplemental oxygen  up to 6 LPM (which he typically needs during ambulation), he was not recovering.  He denies cough.  He denies pleuritic pain.  He denies sputum production.  He denies chest pain.  He does state that when he was profoundly hypoxic he did notice that he was experiencing blurring of vision.  He has noted some modest increased swelling in his lower extremities.  He does confess that he is prescribed daily furosemide  but is inconsistent with adherence to prescribed therapy.   Patient says he has had a very stressful and very active week, which revolves around having to get to multiple medical appointments.  He had an iron infusion a few days  ago.  He also had a Rituxan  infusion earlier this week.  Patient says he has been having blood in his stools, although he says that this is not any more than usual.  This has been a chronic problem ever since his diagnosis of Wegener's granulomatosis.     Subjective Continues to feel bad. He is on 5 L [place]. Sats are 90-92%. Patient is agitated as he needed help, dropped his call bell and was having to yell for help. He desateuated to 81%, I increased his oxygen  from 5 L to 10L. His sats corrected to 92%, I have weaned back to 6 L, with hopes we can wean back to 5 L.  Objective    BP (!) 157/73 (BP Location: Left Arm)   Pulse 83   Temp 98.8 F (37.1 C) (Oral)   Resp 18   Ht 5\' 10"  (1.778 m)   Wt 89.8 kg   SpO2 93%   BMI 28.41 kg/m   Intake/Output Summary (Last 24 hours) at 02/14/2023 1119 Last data filed at 02/14/2023 3474 Gross per 24 hour  Intake 100 ml  Output 1100 ml  Net -1000 ml    General 78 year old male sitting up in chair. Has marked increased wob, is agitated and upset HENT NCAT no JVD Pulm:Bilateral chest excursion, Accessory muscle use noted,diffuse rales, short of breath with conversation Card ST, rrr, No RMG Abd soft, NT, ND, BS +, Body mass index is 28.41 kg/m.  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 Desaturation today with agitation Neg 8.475   liters, BNP 155, Pro calcitonin has slightly reduced.( 0.36 from 0.54), CRP 12.7 Bubble study neg. EF good  Plan He is s/p high dose steroids 125 mg bid x 7 days and reduced to 125 mg daily on 2/7 Now on 30mg  bid , he is going to require slow taper  Will increase  cellcept  to 1 gram BID from current 500 BID,  Discussed with Dr. Bertrum Brodie his primary pulmonologist.  Since there is no improvement we will consider Cytoxan  infusion BNP, 155, Net negative 8500 cc's,  Cont to wean O2 as able Encourage PO intake Cont lasix  as BP/BUN and cr allow, will give one time dose Lasix  40 mg on 2/10 Goal is to get to 2 lpm prior to dc  Cont dapsone  for PCP prophylaxis  May need to consider palliative eval if no improvement  Discussed in detail with patient and family at bedside.  Cottrell Gentles MD [facility] See Amion for pager  If no response to pager , please call [phone] until 7pm After 7:00 pm call Elink  [phone] 02/14/2023, 3:59 PM

## 2023-02-14 NOTE — Progress Notes (Signed)
 PROGRESS NOTE        PATIENT DETAILS Name: Gary [name] Age: 78 y.o. Sex: male Date of Birth: [birth year] Admit Date: 02/03/2023 Admitting Physician Cala Castleman, MD UJW:[id number], Fabian Holster, DO  Brief Summary: Patient is a 78 y.o.  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 2/06>> Echo:EF 60-65%, agitated saline contrast bubble study negative-normal evidence of interatrial shunt.  Significant microbiology data: 1/30>> respiratory virus panel: Negative 1/30>> COVID/influenza/RSV PCR: Negative  Procedures: None  Consults: PCCM  Subjective: Patient in bed, appears comfortable, denies any headache, no fever, no chest pain or pressure, overall still feels shortness of breath on minimal exertion, no abdominal pain. No focal weakness.   Objective: Vitals: Blood pressure (!) 157/73, pulse 83, temperature 98.8 F (37.1 C), temperature source Oral, resp. rate 18, height 5\' 10"  (1.778 m), weight 89.8 kg, SpO2 93%.   Exam:  Awake Alert, No new F.N deficits, Normal affect [place].AT,PERRAL Supple Neck, No JVD,   Symmetrical Chest wall movement, Good air movement bilaterally, chronic bibasilar fine crackles RRR,No Gallops, Rubs or new Murmurs,  +ve B.Sounds, Abd Soft, No tenderness,   No Cyanosis, Clubbing or edema     Assessment/Plan:  Acute on chronic hypoxic respiratory failure secondary to GPA associated ILD (on home O2-anywhere from 3-4 L) Slowly improving-Down to 4-6 L of oxygen -at one point  several days back-he was on 12 L of HFNC . Although hypoxia slowly improving-he continues to have severe exertional dyspnea with minimal activity with significant drops in saturation-with slow but gradual recovery with rest.  He seems a bit more fatigued/tired today. Remains on CellCept /Solu-Medrol  now transition to oral prednisone  As needed Lasix  Echo without cardiac shunt-stable EF 60%. Continue attempts to slowly titrate down FiO2. PCCM continues to follow, he continues to feel poorly, requested PCCM to reevaluate him on 02/14/2023  SpO2: 93 % O2 Flow Rate (L/min): 5 L/min FiO2 (%): 40 %    History of granulomatosis with polyangiitis (GPA) Was on tapering steroids (down to 40 mg)-when he developed worsening respiratory failure Prophylactic dapsone  for PJP (no Bactrim  as interaction with Tikosyn ) PCCM started CellCept -no longer on Rituxan  Currently on IV Solu-Medrol -dose reduced from twice daily to daily on 2/7.  Paroxysmal atrial fibrillation Maintaining sinus rhythm Tikosyn /Coreg  Has been transitioned to Eliquis -no plans to restart Coumadin  on discharge.  Chronic HFpEF Euvolemic-on Lasix  to ensure negative balance.  History of CAD s/p PCI September 2023 Aspirin  No longer on Plavix -this was also not on his most recent discharge list of medications.  Iron deficiency anemia Appreciate GI input-plans are for outpatient endoscopy evaluation once his respiratory status improves-No overt bleeding noted-some drop in Hb but no obvious evidence of GI bleed.  Given IV iron in 2/4-follow CBC  Hypothyroidism Synthroid   Mood disorder Stable Continue Effexor    DM-2 with steroid related hyperglycemia CBGs continue to fluctuate-elevated yesterday-borderline hypoglycemic today Decrease Semglee  to 10 units Continue SSI Follow-up with   Recent Labs    02/13/23 1618 02/13/23 2055 02/14/23 0855  GLUCAP 238* 123* 132*  BMI: Estimated body mass index is 28.41 kg/m as  calculated from the following:   Height as of this encounter: 5\' 10"  (1.778 m).   Weight as of this encounter: 89.8 kg.   Code status:   Code Status: Full Code   DVT Prophylaxis: SCDs Start: 02/03/23 1523 apixaban  (ELIQUIS ) tablet 5 mg    Family Communication:Spouse [name]-[phone] on 2/4   Disposition Plan: Status is: Inpatient Remains inpatient appropriate because: Severity of illness   Planned Discharge Destination:Home health   Diet: Diet Order             Diet regular Fluid consistency: Thin  Diet effective now                     Antimicrobial agents: Anti-infectives (From admission, onward)    Start     Dose/Rate Route Frequency Ordered Stop   02/03/23 1445  dapsone  tablet 100 mg        100 mg Oral Daily 02/03/23 1442          MEDICATIONS: Scheduled Meds:  ALPRAZolam   0.5 mg Oral QHS   apixaban   5 mg Oral BID   aspirin   81 mg Oral Daily   carvedilol   3.125 mg Oral BID WC   dapsone   100 mg Oral Daily   dofetilide   500 mcg Oral BID   dronabinol   5 mg Oral QAC lunch   feeding supplement (GLUCERNA SHAKE)  237 mL Oral BID BM   insulin  aspart  0-20 Units Subcutaneous TID WC   insulin  aspart  0-5 Units Subcutaneous QHS   insulin  aspart  4 Units Subcutaneous TID WC   insulin  glargine-yfgn  12 Units Subcutaneous Daily   levothyroxine   137 mcg Oral Q0600   mycophenolate   500 mg Oral BID   pantoprazole   40 mg Oral BID   predniSONE   30 mg Oral BID WC   venlafaxine  XR  150 mg Oral Daily   Continuous Infusions:   PRN Meds:.acetaminophen  **OR** acetaminophen , bisacodyl , chlorpheniramine-HYDROcodone , metoprolol  tartrate, nitroGLYCERIN , mouth rinse, promethazine    I have personally reviewed following labs and imaging studies  LABORATORY DATA:   Data Review:    Recent Labs  Lab 02/08/23 0453 02/10/23 0450 02/11/23 0527 02/12/23 0506 02/13/23 0544 02/14/23 0514  WBC 14.0* 10.2 11.7* 12.7* 9.9 8.9  HGB 9.3* 7.7* 7.8* 8.6* 8.6* 8.8*  HCT  32.2* 26.6* 27.5* 29.8* 28.9* 29.9*  PLT 389 302 306 322 314 310  MCV 87.0 88.1 89.6 90.6 89.5 89.8  MCH 25.1* 25.5* 25.4* 26.1 26.6 26.4  MCHC 28.9* 28.9* 28.4* 28.9* 29.8* 29.4*  RDW 25.1* 25.2* 25.5* 26.1* 25.8* 25.6*  LYMPHSABS 0.7  --   --   --  0.7 0.4*  MONOABS 0.6  --   --   --  0.3 0.2  EOSABS 0.0  --   --   --  0.0 0.0  BASOSABS 0.0  --   --   --  0.0 0.0    Recent Labs  Lab 02/08/23 0453 02/09/23 0440 02/09/23 1433 02/10/23 0856 02/11/23 0527 02/12/23 0506 02/12/23 1133 02/13/23 0544 02/13/23 0748 02/14/23 0514  NA 142   < >  --  140 140 139  --  141  --  142  K 4.0   < >  --  3.6 4.5 4.0  --  4.3  --  4.2  CL 102   < >  --  101 101 99  --  99  --  92*  CO2 27   < >  --  24 29 30   --  31  --  33*  ANIONGAP 13   < >  --  15 10 10   --  11  --  17*  GLUCOSE 148*   < >  --  309* 199* 75  --  151*  --  96  BUN 24*   < >  --  42* 40* 37*  --  38*  --  29*  CREATININE 0.84   < >  --  1.05 0.78 0.91  --  0.86  --  0.80  CRP  --   --  12.4*  --   --   --   --  11.1*  --  12.7*  PROCALCITON 0.28  --  0.24  --   --   --  0.50 0.54  --  0.36  BNP  --   --   --   --   --   --  183.8*  --  226.7* 155.0*  MG  --   --   --   --   --   --   --  2.3  --  2.4  PHOS  --   --   --   --   --   --   --  2.6  --  3.2  CALCIUM  9.3   < >  --  9.7 9.4 9.5  --  9.6  --  10.2   < > = values in this interval not displayed.      Recent Labs  Lab 02/08/23 0453 02/09/23 0440 02/09/23 1433 02/10/23 0856 02/11/23 0527 02/12/23 0506 02/12/23 1133 02/13/23 0544 02/13/23 0748 02/14/23 0514  CRP  --   --  12.4*  --   --   --   --  11.1*  --  12.7*  PROCALCITON 0.28  --  0.24  --   --   --  0.50 0.54  --  0.36  BNP  --   --   --   --   --   --  183.8*  --  226.7* 155.0*  MG  --   --   --   --   --   --   --  2.3  --  2.4  CALCIUM  9.3   < >  --  9.7 9.4 9.5  --  9.6  --  10.2   < > = values in this interval not displayed.     --------------------------------------------------------------------------------------------------------------- Lab Results  Component Value Date   CHOL 155 05/26/2020   HDL 45 05/26/2020   LDLCALC 79 05/26/2020   TRIG 184 (H) 05/26/2020   CHOLHDL 3.4 05/26/2020    Lab Results  Component Value Date   HGBA1C 5.9 (H) 01/02/2023   Radiology Reports DG Chest Port 1 View Result Date: 02/12/2023 CLINICAL DATA:  Interstitial lung disease EXAM: PORTABLE CHEST 1 VIEW COMPARISON:  02/09/2023 FINDINGS: Single frontal view of the chest demonstrates postsurgical changes from CABG. Cardiac silhouette is enlarged but stable. Diffuse interstitial lung disease is again identified, with bilateral fibrotic changes and scattered ground-glass opacities most pronounced in the right upper and left lower lung zones similar to prior CT exam. No new consolidation, effusion, or pneumothorax. No acute bony abnormalities. IMPRESSION: 1. Stable findings of chronic interstitial lung disease, with fibrosis and ground-glass opacities unchanged since recent high-resolution chest CT 01/07/2023. 2. No evidence of acute intrathoracic process. Electronically Signed  By: Bobbye Burrow M.D.   On: 02/12/2023 15:32   ECHOCARDIOGRAM LIMITED BUBBLE STUDY Result Date: 02/10/2023    ECHOCARDIOGRAM LIMITED REPORT   Patient Name:   Gary [name] Date of Exam: 02/10/2023 Medical Rec #:  [id number]         Height:       70.0 in Accession #:    [id number]        Weight:       201.7 lb Date of Birth:  1945/08/22          BSA:          2.095 m Patient Age:    77 years          BP:           149/81 mmHg Patient Gender: M                 HR:           75 bpm. Exam Location:  Inpatient Procedure: Limited Echo, Cardiac Doppler, Color Doppler and Saline Contrast            Bubble Study Indications:    ASD  History:        Patient has prior history of Echocardiogram examinations, most                 recent 12/07/2022. CAD; Risk Factors:Hypertension,  Diabetes,                 Dyslipidemia, Former Smoker and Sleep Apnea.  Sonographer:    Reta Cassis Referring Phys: [id number] Tino Foreman SMITH IMPRESSIONS  1. Left ventricular ejection fraction, by estimation, is 60 to 65%. The left ventricle has normal function. The left ventricle has no regional wall motion abnormalities. There is moderate concentric left ventricular hypertrophy. Left ventricular diastolic function could not be evaluated.  2. Right ventricular systolic function is normal. The right ventricular size is normal. There is moderately elevated pulmonary artery systolic pressure.  3. The mitral valve is degenerative. Mild mitral valve regurgitation. No evidence of mitral stenosis. Moderate mitral annular calcification.  4. The aortic valve is calcified. There is mild calcification of the aortic valve. Aortic valve regurgitation is not visualized. Aortic valve sclerosis/calcification is present, without any evidence of aortic stenosis.  5. The inferior vena cava is normal in size with greater than 50% respiratory variability, suggesting right atrial pressure of 3 mmHg.  6. Agitated saline contrast bubble study was negative, with no evidence of any interatrial shunt. FINDINGS  Left Ventricle: Left ventricular ejection fraction, by estimation, is 60 to 65%. The left ventricle has normal function. The left ventricle has no regional wall motion abnormalities. The left ventricular internal cavity size was normal in size. There is  moderate concentric left ventricular hypertrophy. Left ventricular diastolic function could not be evaluated. Left ventricular diastolic function could not be evaluated due to mitral annular calcification (moderate or greater). Right Ventricle: The right ventricular size is normal. No increase in right ventricular wall thickness. Right ventricular systolic function is normal. There is moderately elevated pulmonary artery systolic pressure. The tricuspid regurgitant velocity is 3.58 m/s,  and with an assumed right atrial pressure of 3 mmHg, the estimated right ventricular systolic pressure is 54.3 mmHg. Left Atrium: Left atrial size was not assessed. Right Atrium: Right atrial size was not assessed. Pericardium: There is no evidence of pericardial effusion. Mitral Valve: The mitral valve is degenerative in appearance. Moderate mitral annular calcification. Mild mitral valve regurgitation.  No evidence of mitral valve stenosis. Tricuspid Valve: The tricuspid valve is normal in structure. Tricuspid valve regurgitation is mild . No evidence of tricuspid stenosis. Aortic Valve: The aortic valve is calcified. There is mild calcification of the aortic valve. Aortic valve regurgitation is not visualized. Aortic valve sclerosis/calcification is present, without any evidence of aortic stenosis. Pulmonic Valve: The pulmonic valve was not well visualized. Pulmonic valve regurgitation is not visualized. No evidence of pulmonic stenosis. Aorta: Aortic root could not be assessed. Venous: The inferior vena cava is normal in size with greater than 50% respiratory variability, suggesting right atrial pressure of 3 mmHg. IAS/Shunts: No atrial level shunt detected by color flow Doppler. Agitated saline contrast was given intravenously to evaluate for intracardiac shunting. Agitated saline contrast bubble study was negative, with no evidence of any interatrial shunt. Additional Comments: Spectral Doppler performed. Color Doppler performed.  TRICUSPID VALVE TR Peak grad:   51.3 mmHg TR Vmax:        358.00 cm/s Chyrl Crawford Tobb DO Electronically signed by Jerryl Morin DO Signature Date/Time: 02/10/2023/12:47:16 PM    Final    DG Chest Port 1 View Result Date: 02/09/2023 CLINICAL DATA:  Respiratory failure. EXAM: PORTABLE CHEST 1 VIEW COMPARISON:  02/03/2023 FINDINGS: Low volume film. The cardio pericardial silhouette is enlarged. Diffuse interstitial opacity again noted, progressive in the parahilar left mid lung. No substantial  pleural effusion. No acute bony abnormality. Telemetry leads overlie the chest. IMPRESSION: Low volume film with progressive interstitial opacity in the parahilar left mid lung. Imaging features could be related to asymmetric edema or infection. Electronically Signed   By: Donnal Fusi M.D.   On: 02/09/2023 08:54   DG Chest Portable 1 View Result Date: 02/03/2023 CLINICAL DATA:  Shortness of breath. Hypoxia. History of Wegener's and interstitial lung disease. EXAM: PORTABLE CHEST 1 VIEW COMPARISON:  Radiographs 01/05/2023 and 01/01/2023.  CT 01/07/2023. FINDINGS: 1252 hours. The heart size and mediastinal contours are stable status post median sternotomy and CABG. Chronic interstitial lung disease again noted with possible slight worsening on the right which could indicate superimposed edema or inflammation. There is no consolidation, pneumothorax or significant pleural effusion. The bones appear unchanged. IMPRESSION: Chronic interstitial lung disease with possible slight worsening on the right which could indicate superimposed edema or inflammation. Electronically Signed   By: Elmon Hagedorn M.D.   On: 02/03/2023 14:01   CT Chest High Resolution Result Date: 01/07/2023 CLINICAL DATA:  Inpatient. Diffuse/interstitial lung disease ild flare EXAM: CT CHEST WITHOUT CONTRAST TECHNIQUE: Multidetector CT imaging of the chest was performed following the standard protocol without intravenous contrast. High resolution imaging of the lungs, as well as inspiratory and expiratory imaging, was performed. RADIATION DOSE REDUCTION: This exam was performed according to the departmental dose-optimization program which includes automated exposure control, adjustment of the mA and/or kV according to patient size and/or use of iterative reconstruction technique. COMPARISON:  01/05/2023 chest radiograph. 11/05/2022 high-resolution chest CT. FINDINGS: Cardiovascular: Stable mild cardiomegaly. No significant pericardial  effusion/thickening. Three-vessel coronary atherosclerosis status post CABG. Atherosclerotic nonaneurysmal thoracic aorta. Normal caliber pulmonary arteries. Mediastinum/Nodes: No significant thyroid  nodules. Unremarkable esophagus. No axillary adenopathy. Mild right paratracheal adenopathy up to 1.2 cm (series 5/image 45), unchanged. No new pathologically enlarged mediastinal nodes. No discrete hilar adenopathy on these noncontrast images. Lungs/Pleura: No pneumothorax. No pleural effusion. No acute consolidative airspace disease, lung masses or significant pulmonary nodules. Stable mild patchy air trapping in both lungs on the expiration sequence, without evidence of tracheobronchomalacia. Extensive patchy peribronchovascular  and subpleural reticulation and ground-glass opacity throughout both lungs with associated mild traction bronchiectasis and architectural distortion. No clear apicobasilar gradient to these findings. Scattered foci of mild honeycombing in both lungs, for example in the peripheral right lung on series 8/image 80 and in the peripheral lower left lung on series 8/image 98). Ground-glass opacities have worsened since 11/05/2022 chest CT. Findings are otherwise not appreciably changed. Upper abdomen: Cholelithiasis. Musculoskeletal: No aggressive appearing focal osseous lesions. Intact sternotomy wires. Moderate thoracic spondylosis. IMPRESSION: 1. Spectrum of findings compatible with fibrotic interstitial lung disease with mild honeycombing, with worsened diffuse patchy ground-glass opacity in the interval since 11/05/2022 CT. Acute flare of chronic fibrotic interstitial lung disease favored. As previously described, chronic hypersensitivity pneumonitis or fibrotic NSIP are favored. Findings are suggestive of an alternative diagnosis (not UIP) per consensus guidelines: Diagnosis of Idiopathic Pulmonary Fibrosis: An Official ATS/ERS/JRS/ALAT Clinical Practice Guideline. Am Annie Barton Crit Care Med  Vol 198, Iss 5, [phone], Sep 04 2016. 2. Stable mild cardiomegaly. 3. Cholelithiasis. 4.  Aortic Atherosclerosis (ICD10-I70.0). Electronically Signed   By: Levell Reach M.D.   On: 01/07/2023 10:28   CARDIAC CATHETERIZATION Result Date: 01/06/2023 1. Normal filling pressures. 2. Mild pulmonary hypertension without elevated PVR. 3. Normal cardiac output.   VAS US  LOWER EXTREMITY VENOUS (DVT) Result Date: 01/05/2023  Lower Venous DVT Study Patient Name:  DYLLIN LICHTENSTEIN [facility]  Date of Exam:   01/05/2023 Medical Rec #: [id number]          Accession #:    [id number] Date of Birth: 03-01-1945           Patient Gender: M Patient Age:   27 years Exam Location:  [facility] Procedure:      VAS US  LOWER EXTREMITY VENOUS (DVT) Referring Phys: Felton Hough --------------------------------------------------------------------------------  Indications: Edema.  Anticoagulation: Coumadin . Limitations: Poor ultrasound/tissue interface. Performing Technologist: Arlyce Berger RVT, RDMS  Examination Guidelines: A complete evaluation includes B-mode imaging, spectral Doppler, color Doppler, and power Doppler as needed of all accessible portions of each vessel. Bilateral testing is considered an integral part of a complete examination. Limited examinations for reoccurring indications may be performed as noted. The reflux portion of the exam is performed with the patient in reverse Trendelenburg.  +--------+---------------+---------+-----------+----------+--------------------+ RIGHT   CompressibilityPhasicitySpontaneityPropertiesThrombus Aging       +--------+---------------+---------+-----------+----------+--------------------+ CFV     Full           Yes      Yes                                       +--------+---------------+---------+-----------+----------+--------------------+ SFJ     Full                                                               +--------+---------------+---------+-----------+----------+--------------------+ FV Prox Full           Yes      Yes                                       +--------+---------------+---------+-----------+----------+--------------------+ FV Mid  Full  Yes      Yes                                       +--------+---------------+---------+-----------+----------+--------------------+ FV                     Yes      Yes                  patent by            Distal                                               color/doppler        +--------+---------------+---------+-----------+----------+--------------------+ PFV     Full                                                              +--------+---------------+---------+-----------+----------+--------------------+ POP     Full           Yes      Yes                                       +--------+---------------+---------+-----------+----------+--------------------+ PTV     Full                                                              +--------+---------------+---------+-----------+----------+--------------------+ PERO    Full                                                              +--------+---------------+---------+-----------+----------+--------------------+   +--------+---------------+---------+-----------+----------+--------------------+ LEFT    CompressibilityPhasicitySpontaneityPropertiesThrombus Aging       +--------+---------------+---------+-----------+----------+--------------------+ CFV     Full           Yes      Yes                                       +--------+---------------+---------+-----------+----------+--------------------+ SFJ     Full                                                              +--------+---------------+---------+-----------+----------+--------------------+ FV Prox Full           Yes      Yes                                        +--------+---------------+---------+-----------+----------+--------------------+  FV Mid  Full           Yes      Yes                                       +--------+---------------+---------+-----------+----------+--------------------+ FV                     Yes      Yes                  patent by            Distal                                               color/doppler        +--------+---------------+---------+-----------+----------+--------------------+ PFV     Full                                                              +--------+---------------+---------+-----------+----------+--------------------+ POP     Full           Yes      Yes                                       +--------+---------------+---------+-----------+----------+--------------------+ PTV     Full                                                              +--------+---------------+---------+-----------+----------+--------------------+ PERO    Full                                                              +--------+---------------+---------+-----------+----------+--------------------+     Summary: BILATERAL: - No evidence of deep vein thrombosis seen in the lower extremities, bilaterally. -No evidence of popliteal cyst, bilaterally.   *See table(s) above for measurements and observations. Electronically signed by Angela Kell MD on 01/05/2023 at 5:34:57 PM.    Final    DG Chest Port 1 View Result Date: 01/05/2023 CLINICAL DATA:  Acute respiratory failure. History of hypertension and coronary artery disease. EXAM: PORTABLE CHEST 1 VIEW COMPARISON:  01/01/2023 FINDINGS: Previous median sternotomy and CABG procedure. Lung volumes are low. Diffuse increased reticular and nodular interstitial opacities within both lungs compatible with previously characterized chronic fibrotic no superimposed pleural effusion, interstitial edema or airspace consolidation. Interstitial lung disease  IMPRESSION: 1. No acute findings. 2. Chronic interstitial lung disease. Electronically Signed   By: Kimberley Penman M.D.   On: 01/05/2023 12:24   DG Chest Port 1 View Result Date: 01/01/2023 CLINICAL DATA:  Shortness of breath.  EXAM: PORTABLE CHEST 1 VIEW COMPARISON:  09/17/2022. FINDINGS: Low lung volume. There are diffuse increased interstitial markings, less pronounced than the prior radiograph from 10/04/2022, which corresponds to chronic interstitial lung disease on recent chest CT scan from 11/05/2022. Bilateral lung fields are otherwise clear. No acute consolidation or lung collapse. Bilateral costophrenic angles are clear. Stable cardio-mediastinal silhouette. Sternotomy wires noted, status post CABG. No acute osseous abnormalities. The soft tissues are within normal limits. IMPRESSION: *No acute cardiopulmonary abnormality.  Interstitial lung disease. Electronically Signed   By: Beula Brunswick M.D.   On: 01/01/2023 17:19   ECHOCARDIOGRAM COMPLETE Result Date: 12/07/2022    ECHOCARDIOGRAM REPORT   Patient Name:   Gary [name] Date of Exam: 12/07/2022 Medical Rec #:  [id number]         Height:       68.3 in Accession #:    [id number]        Weight:       221.0 lb Date of Birth:  10/17/1945          BSA:          2.139 m Patient Age:    77 years          BP:           128/80 mmHg Patient Gender: M                 HR:           81 bpm. Exam Location:  [address] Procedure: 2D Echo, 3D Echo, Cardiac Doppler and Color Doppler Indications:    R06.00 Dyspnea  History:        Patient has prior history of Echocardiogram examinations, most                 recent 03/02/2022. CHF, CAD, Prior CABG, Arrythmias:Atrial                 Fibrillation, Signs/Symptoms:Dyspnea; Risk Factors:Hypertension,                 Dyslipidemia, Former Smoker, Family History of Coronary Artery                 Disease and Sleep Apnea. Interstitial Lung Disease, Wegener's                 Granulomatosis.  Sonographer:    Ewing Holiday RDCS Referring Phys: Maire Scot IMPRESSIONS  1. Left ventricular ejection fraction, by estimation, is 60 to 65%. The left ventricle has normal function. The left ventricle has no regional wall motion abnormalities. Left ventricular diastolic parameters are consistent with Grade I diastolic dysfunction (impaired relaxation).  2. Mid systolic notching of the pulmonary artery pressure tracing consistent with pulmonary hypertension. RVSP estimated at . . Right ventricular systolic function is normal. The right ventricular size is mildly enlarged.  3. Left atrial size was moderately dilated.  4. Right atrial size was severely dilated.  5. The mitral valve is normal in structure. Trivial mitral valve regurgitation. No evidence of mitral stenosis. Moderate mitral annular calcification.  6. The aortic valve is normal in structure. Aortic valve regurgitation is not visualized. No aortic stenosis is present.  7. Aortic dilatation noted. There is mild dilatation of the ascending aorta, measuring 41 mm.  8. The inferior vena cava is normal in size with greater than 50% respiratory variability, suggesting right atrial pressure of 3 mmHg. FINDINGS  Left Ventricle: Left ventricular ejection fraction, by estimation, is 60 to 65%.  The left ventricle has normal function. The left ventricle has no regional wall motion abnormalities. The left ventricular internal cavity size was normal in size. There is  no left ventricular hypertrophy. Left ventricular diastolic parameters are consistent with Grade I diastolic dysfunction (impaired relaxation). Right Ventricle: Mid systolic notching of the pulmonary artery pressure tracing consistent with pulmonary hypertension. RVSP estimated at . The right ventricular size is mildly enlarged. No increase in right ventricular wall thickness. Right ventricular systolic function is normal. Left Atrium: Left atrial size was moderately dilated. Right Atrium: Right atrial size  was severely dilated. Pericardium: There is no evidence of pericardial effusion. Mitral Valve: The mitral valve is normal in structure. Moderate mitral annular calcification. Trivial mitral valve regurgitation. No evidence of mitral valve stenosis. MV peak gradient, 5.7 mmHg. The mean mitral valve gradient is 2.0 mmHg. Tricuspid Valve: The tricuspid valve is normal in structure. Tricuspid valve regurgitation is not demonstrated. No evidence of tricuspid stenosis. Aortic Valve: The aortic valve is normal in structure. Aortic valve regurgitation is not visualized. No aortic stenosis is present. Aortic valve mean gradient measures 7.0 mmHg. Aortic valve peak gradient measures 13.0 mmHg. Aortic valve area, by VTI measures 2.51 cm. Pulmonic Valve: The pulmonic valve was normal in structure. Pulmonic valve regurgitation is not visualized. No evidence of pulmonic stenosis. Aorta: Aortic dilatation noted and the aortic root is normal in size and structure. There is mild dilatation of the ascending aorta, measuring 41 mm. Venous: The inferior vena cava is normal in size with greater than 50% respiratory variability, suggesting right atrial pressure of 3 mmHg. IAS/Shunts: No atrial level shunt detected by color flow Doppler.  LEFT VENTRICLE PLAX 2D LVIDd:         5.40 cm   Diastology LVIDs:         3.60 cm   LV e' medial:    5.87 cm/s LV PW:         1.10 cm   LV E/e' medial:  16.0 LV IVS:        1.10 cm   LV e' lateral:   10.30 cm/s LVOT diam:     2.60 cm   LV E/e' lateral: 9.1 LV SV:         99 LV SV Index:   46 LVOT Area:     5.31 cm                           3D Volume EF:                          3D EF:        60 %                          LV EDV:       187 ml                          LV ESV:       75 ml                          LV SV:        113 ml RIGHT VENTRICLE RV Basal diam:  4.50 cm RV Mid diam:    3.50 cm RV S prime:     16.10 cm/s TAPSE (M-mode):  2.3 cm RVSP:           59.2 mmHg LEFT ATRIUM              Index         RIGHT ATRIUM           Index LA diam:        5.30 cm  2.48 cm/m   RA Pressure: 3.00 mmHg LA Vol (A2C):   79.1 ml  36.99 ml/m  RA Area:     31.20 cm LA Vol (A4C):   112.0 ml 52.37 ml/m  RA Volume:   122.00 ml 57.05 ml/m LA Biplane Vol: 98.6 ml  46.11 ml/m  AORTIC VALVE AV Area (Vmax):    2.48 cm AV Area (Vmean):   2.44 cm AV Area (VTI):     2.51 cm AV Vmax:           180.00 cm/s AV Vmean:          121.000 cm/s AV VTI:            0.393 m AV Peak Grad:      13.0 mmHg AV Mean Grad:      7.0 mmHg LVOT Vmax:         84.10 cm/s LVOT Vmean:        55.550 cm/s LVOT VTI:          0.186 m LVOT/AV VTI ratio: 0.47  AORTA Ao Root diam: 3.40 cm Ao Asc diam:  4.05 cm MITRAL VALVE               TRICUSPID VALVE MV Area (PHT): cm         TR Peak grad:   56.2 mmHg MV Area VTI:   2.87 cm    TR Vmax:        375.00 cm/s MV Peak grad:  5.7 mmHg    Estimated RAP:  3.00 mmHg MV Mean grad:  2.0 mmHg    RVSP:           59.2 mmHg MV Vmax:       1.19 m/s MV Vmean:      55.7 cm/s   SHUNTS MV Decel Time: 219 msec    Systemic VTI:  0.19 m MV E velocity: 94.10 cm/s  Systemic Diam: 2.60 cm MV A velocity: 67.15 cm/s MV E/A ratio:  1.40 Arta Lark Electronically signed by Arta Lark Signature Date/Time: 12/07/2022/4:32:10 PM    Final       Signature  -   Lynnwood Sauer M.D on 02/14/2023 at 11:02 AM   -  To page go to [email or web address]

## 2023-02-14 NOTE — Plan of Care (Signed)
   Problem: Education: Goal: Ability to describe self-care measures that may prevent or decrease complications (Diabetes Survival Skills Education) will improve Outcome: Progressing Goal: Individualized Educational Video(s) Outcome: Progressing   Problem: Coping: Goal: Ability to adjust to condition or change in health will improve Outcome: Progressing   Problem: Fluid Volume: Goal: Ability to maintain a balanced intake and output will improve Outcome: Progressing   Problem: Health Behavior/Discharge Planning: Goal: Ability to identify and utilize available resources and services will improve Outcome: Progressing Goal: Ability to manage health-related needs will improve Outcome: Progressing   Problem: Metabolic: Goal: Ability to maintain appropriate glucose levels will improve Outcome: Progressing   Problem: Nutritional: Goal: Maintenance of adequate nutrition will improve Outcome: Progressing Goal: Progress toward achieving an optimal weight will improve Outcome: Progressing   Problem: Skin Integrity: Goal: Risk for impaired skin integrity will decrease Outcome: Progressing   Problem: Tissue Perfusion: Goal: Adequacy of tissue perfusion will improve Outcome: Progressing   Problem: Education: Goal: Knowledge of General Education information will improve Description: Including pain rating scale, medication(s)/side effects and non-pharmacologic comfort measures Outcome: Progressing   Problem: Health Behavior/Discharge Planning: Goal: Ability to manage health-related needs will improve Outcome: Progressing   Problem: Clinical Measurements: Goal: Ability to maintain clinical measurements within normal limits will improve Outcome: Progressing Goal: Will remain free from infection Outcome: Progressing Goal: Diagnostic test results will improve Outcome: Progressing Goal: Respiratory complications will improve Outcome: Progressing Goal: Cardiovascular complication will  be avoided Outcome: Progressing   Problem: Activity: Goal: Risk for activity intolerance will decrease Outcome: Progressing   Problem: Coping: Goal: Level of anxiety will decrease Outcome: Progressing   Problem: Elimination: Goal: Will not experience complications related to bowel motility Outcome: Progressing Goal: Will not experience complications related to urinary retention Outcome: Progressing   Problem: Pain Managment: Goal: General experience of comfort will improve and/or be controlled Outcome: Progressing   Problem: Safety: Goal: Ability to remain free from injury will improve Outcome: Progressing   Problem: Skin Integrity: Goal: Risk for impaired skin integrity will decrease Outcome: Progressing

## 2023-02-14 NOTE — Progress Notes (Signed)
 PT Cancellation Note  Patient Details Name: Gary [name] MRN: [id number] DOB: Dec 15, 1945   Cancelled Treatment:    Reason Eval/Treat Not Completed: (P) Fatigue/lethargy limiting ability to participate (Pt recently worked with OT and reports increased shortness of breath and fatigue. Will continue to follow per PT POC.)   Michaelle Adolphus 02/14/2023, 3:33 PM

## 2023-02-14 NOTE — Progress Notes (Signed)
 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  Pt with hx including but not limited to Wegener's granulomatosis, ILD, former smoker, CAD/CABG, afib, CHF, DM2   OT comments  Pt making slow progression towards goals, remains motivated, but limited by respiratory status. Pt able to complete seated/standing bathing and dressing task with CGA-minA, min A for standing with RW, pt able to stand x3 with RW for LB bathing/dressing and change of linen in chair. Pt needing ~2 min to recover after standing each time and needed up to 8L O2 to remain high 80s/low 90s. Pt presenting with impairments listed below, will follow acutely. Continue to recommend HHOT at d/c pending progression.      If plan is discharge home, recommend the following:  A little help with walking and/or transfers;A lot of help with bathing/dressing/bathroom;Assistance with cooking/housework;Help with stairs or ramp for entrance;Assist for transportation   Equipment Recommendations  None recommended by OT    Recommendations for Other Services PT consult    Precautions / Restrictions Precautions Precautions: Other (comment) Precaution Comments: watch sats Restrictions Weight Bearing Restrictions Per Provider Order: No       Mobility Bed Mobility               General bed mobility comments: OOB in recliner upon arrival and departure    Transfers Overall transfer level: Needs assistance Equipment used: Rolling walker (2 wheels) Transfers: Sit to/from Stand Sit to Stand: Min assist           General transfer comment: cues to push from arm of chair     Balance Overall balance assessment: Mild deficits observed, not formally tested                                         ADL either performed or  assessed with clinical judgement   ADL Overall ADL's : Needs assistance/impaired Eating/Feeding: Set up   Grooming: Set up;Wash/dry face;Sitting   Upper Body Bathing: Minimal assistance;Sitting Upper Body Bathing Details (indicate cue type and reason): assist to wash back Lower Body Bathing: Minimal assistance;Sitting/lateral leans;Sit to/from stand Lower Body Bathing Details (indicate cue type and reason): washing bottom/peri area Upper Body Dressing : Contact guard assist;Sitting   Lower Body Dressing: Contact guard assist;Cueing for safety;Cueing for sequencing               Functional mobility during ADLs: Contact guard assist      Extremity/Trunk Assessment Upper Extremity Assessment Upper Extremity Assessment: Generalized weakness   Lower Extremity Assessment Lower Extremity Assessment: Defer to PT evaluation        Vision   Additional Comments: keeps R eye closed/squinted   Perception Perception Perception: Not tested   Praxis Praxis Praxis: Not tested    Cognition Arousal: Alert Behavior During Therapy: [facility] for tasks assessed/performed Overall Cognitive Status: Within Functional Limits for tasks assessed                                          Exercises      Shoulder Instructions       General Comments VSS on 5-8L O2  Pertinent Vitals/ Pain       Pain Assessment Pain Assessment: No/denies pain  Home Living                                          Prior Functioning/Environment              Frequency  Min 1X/week        Progress Toward Goals  OT Goals(current goals can now be found in the care plan section)  Progress towards OT goals: Progressing toward goals  Acute Rehab OT Goals Patient Stated Goal: none stated OT Goal Formulation: With patient Time For Goal Achievement: 02/21/23 Potential to Achieve Goals: Good ADL Goals Pt Will Perform Lower Body Dressing:  Independently;sitting/lateral leans;sit to/from stand Pt Will Transfer to Toilet: Independently;ambulating;regular height toilet Additional ADL Goal #1: pt will tolerate OOB activity x10 min with SpO2 above 90% in prep for ADLs  Plan      Co-evaluation                 AM-PAC OT "6 Clicks" Daily Activity     Outcome Measure   Help from another person eating meals?: A Little Help from another person taking care of personal grooming?: A Little Help from another person toileting, which includes using toliet, bedpan, or urinal?: A Little Help from another person bathing (including washing, rinsing, drying)?: A Lot Help from another person to put on and taking off regular upper body clothing?: A Little Help from another person to put on and taking off regular lower body clothing?: A Little 6 Click Score: 17    End of Session Equipment Utilized During Treatment: Oxygen   OT Visit Diagnosis: Unsteadiness on feet (R26.81);Other abnormalities of gait and mobility (R26.89);Muscle weakness (generalized) (M62.81)   Activity Tolerance Patient tolerated treatment well   Patient Left in chair;with call bell/phone within reach;with family/visitor present   Nurse Communication Mobility status; charge RN states pt ok to have Ensure        Time: 1227-1259 OT Time Calculation (min): 32 min  Charges: OT General Charges $OT Visit: 1 Visit OT Treatments $Self Care/Home Management : 23-37 mins  Gary [name], OTD, OTR/L SecureChat Preferred Acute Rehab [phone]   Gary [name] 02/14/2023, 1:05 PM

## 2023-02-14 NOTE — TOC Progression Note (Signed)
 Transition of Care [facility]) - Progression Note    Patient Details  Name: Gary [name] MRN: [id number] Date of Birth: [birth year]  Transition of Care [facility]) CM/SW Contact  Jannine Meo, RN Phone Number: 02/14/2023, 1:55 PM  Clinical Narrative:     PCCM consult noted.       Expected Discharge Plan and Services                                               Social Determinants of Health (SDOH) Interventions SDOH Screenings   Food Insecurity: No Food Insecurity (02/04/2023)  Housing: Low Risk  (02/04/2023)  Transportation Needs: No Transportation Needs (02/04/2023)  Utilities: Not At Risk (02/04/2023)  Social Connections: Moderately Isolated (02/04/2023)  Tobacco Use: Medium Risk (02/03/2023)    Readmission Risk Interventions    03/02/2022    4:16 PM  Readmission Risk Prevention Plan  Transportation Screening Complete  HRI or Home Care Consult Complete  Palliative Care Screening Not Applicable  Medication Review (RN Care Manager) Complete

## 2023-02-15 DIAGNOSIS — J9621 Acute and chronic respiratory failure with hypoxia: Secondary | ICD-10-CM | POA: Diagnosis not present

## 2023-02-15 LAB — CBC WITH DIFFERENTIAL/PLATELET
Abs Immature Granulocytes: 0.16 10*3/uL — ABNORMAL HIGH (ref 0.00–0.07)
Basophils Absolute: 0 10*3/uL (ref 0.0–0.1)
Basophils Relative: 0 %
Eosinophils Absolute: 0 10*3/uL (ref 0.0–0.5)
Eosinophils Relative: 0 %
HCT: 30.2 % — ABNORMAL LOW (ref 39.0–52.0)
Hemoglobin: 8.7 g/dL — ABNORMAL LOW (ref 13.0–17.0)
Immature Granulocytes: 2 %
Lymphocytes Relative: 7 %
Lymphs Abs: 0.6 10*3/uL — ABNORMAL LOW (ref 0.7–4.0)
MCH: 25.7 pg — ABNORMAL LOW (ref 26.0–34.0)
MCHC: 28.8 g/dL — ABNORMAL LOW (ref 30.0–36.0)
MCV: 89.3 fL (ref 80.0–100.0)
Monocytes Absolute: 0.4 10*3/uL (ref 0.1–1.0)
Monocytes Relative: 4 %
Neutro Abs: 7.7 10*3/uL (ref 1.7–7.7)
Neutrophils Relative %: 87 %
Platelets: 395 10*3/uL (ref 150–400)
RBC: 3.38 MIL/uL — ABNORMAL LOW (ref 4.22–5.81)
RDW: 25.2 % — ABNORMAL HIGH (ref 11.5–15.5)
WBC: 8.8 10*3/uL (ref 4.0–10.5)
nRBC: 0.8 % — ABNORMAL HIGH (ref 0.0–0.2)

## 2023-02-15 LAB — PROCALCITONIN: Procalcitonin: 0.46 ng/mL

## 2023-02-15 LAB — CBC
HCT: 26.6 % — ABNORMAL LOW (ref 39.0–52.0)
Hemoglobin: 8 g/dL — ABNORMAL LOW (ref 13.0–17.0)
MCH: 26.8 pg (ref 26.0–34.0)
MCHC: 30.1 g/dL (ref 30.0–36.0)
MCV: 89.3 fL (ref 80.0–100.0)
Platelets: 335 10*3/uL (ref 150–400)
RBC: 2.98 MIL/uL — ABNORMAL LOW (ref 4.22–5.81)
RDW: 25.2 % — ABNORMAL HIGH (ref 11.5–15.5)
WBC: 8.5 10*3/uL (ref 4.0–10.5)
nRBC: 0.5 % — ABNORMAL HIGH (ref 0.0–0.2)

## 2023-02-15 LAB — COMPREHENSIVE METABOLIC PANEL
ALT: 59 U/L — ABNORMAL HIGH (ref 0–44)
AST: 33 U/L (ref 15–41)
Albumin: 2.6 g/dL — ABNORMAL LOW (ref 3.5–5.0)
Alkaline Phosphatase: 170 U/L — ABNORMAL HIGH (ref 38–126)
Anion gap: 11 (ref 5–15)
BUN: 34 mg/dL — ABNORMAL HIGH (ref 8–23)
CO2: 29 mmol/L (ref 22–32)
Calcium: 9 mg/dL (ref 8.9–10.3)
Chloride: 97 mmol/L — ABNORMAL LOW (ref 98–111)
Creatinine, Ser: 0.83 mg/dL (ref 0.61–1.24)
GFR, Estimated: >60 mL/min (ref >60)
Glucose, Bld: 126 mg/dL — ABNORMAL HIGH (ref 70–99)
Potassium: 3.9 mmol/L (ref 3.5–5.1)
Sodium: 137 mmol/L (ref 135–145)
Total Bilirubin: 0.5 mg/dL (ref 0.0–1.2)
Total Protein: 5.2 g/dL — ABNORMAL LOW (ref 6.5–8.1)

## 2023-02-15 LAB — BASIC METABOLIC PANEL
Anion gap: 12 (ref 5–15)
BUN: 36 mg/dL — ABNORMAL HIGH (ref 8–23)
CO2: 29 mmol/L (ref 22–32)
Calcium: 9.4 mg/dL (ref 8.9–10.3)
Chloride: 99 mmol/L (ref 98–111)
Creatinine, Ser: 0.98 mg/dL (ref 0.61–1.24)
GFR, Estimated: >60 mL/min (ref >60)
Glucose, Bld: 143 mg/dL — ABNORMAL HIGH (ref 70–99)
Potassium: 4.5 mmol/L (ref 3.5–5.1)
Sodium: 140 mmol/L (ref 135–145)

## 2023-02-15 LAB — URINALYSIS, ROUTINE W REFLEX MICROSCOPIC
Bilirubin Urine: NEGATIVE
Glucose, UA: NEGATIVE mg/dL
Hgb urine dipstick: NEGATIVE
Ketones, ur: NEGATIVE mg/dL
Leukocytes,Ua: NEGATIVE
Nitrite: NEGATIVE
Protein, ur: NEGATIVE mg/dL
Specific Gravity, Urine: 1.021 (ref 1.005–1.030)
pH: 5 (ref 5.0–8.0)

## 2023-02-15 LAB — GLUCOSE, CAPILLARY
Glucose-Capillary: 92 mg/dL (ref 70–99)
Glucose-Capillary: 195 mg/dL — ABNORMAL HIGH (ref 70–99)
Glucose-Capillary: 171 mg/dL — ABNORMAL HIGH (ref 70–99)
Glucose-Capillary: 125 mg/dL — ABNORMAL HIGH (ref 70–99)
Glucose-Capillary: 95 mg/dL (ref 70–99)

## 2023-02-15 LAB — PHOSPHORUS: Phosphorus: 3.3 mg/dL (ref 2.5–4.6)

## 2023-02-15 LAB — BRAIN NATRIURETIC PEPTIDE: B Natriuretic Peptide: 174.3 pg/mL — ABNORMAL HIGH (ref 0.0–100.0)

## 2023-02-15 LAB — C-REACTIVE PROTEIN: CRP: 15.8 mg/dL — ABNORMAL HIGH (ref <1.0)

## 2023-02-15 LAB — MAGNESIUM: Magnesium: 2.3 mg/dL (ref 1.7–2.4)

## 2023-02-15 MED ORDER — DEXTROSE-SODIUM CHLORIDE 5-0.45 % IV SOLN: Freq: Once | INTRAVENOUS | Status: AC

## 2023-02-15 MED ORDER — ONDANSETRON HCL 4 MG PO TABS: 8.0000 mg | ORAL_TABLET | ORAL | Status: DC

## 2023-02-15 MED ORDER — HYDROCORTISONE SOD SUC (PF) 100 MG IJ SOLR
100.0000 mg | Freq: Once | INTRAMUSCULAR | Status: AC
  Administered 2023-02-16: 100 mg via INTRAVENOUS
  Filled 2023-02-15: qty 2

## 2023-02-15 MED ORDER — SODIUM CHLORIDE 0.9 % IV SOLN: 750.0000 mg/m2 | Freq: Once | INTRAVENOUS | Status: DC

## 2023-02-15 MED ORDER — SODIUM CHLORIDE 0.9 % IV SOLN
187.5000 mg/m2 | Freq: Once | INTRAVENOUS | Status: DC
  Filled 2023-02-15: qty 4

## 2023-02-15 MED ORDER — SODIUM CHLORIDE 0.9 % IV SOLN
187.5000 mg/m2 | Freq: Once | INTRAVENOUS | Status: AC
  Administered 2023-02-16: 400 mg via INTRAVENOUS
  Filled 2023-02-15: qty 4

## 2023-02-15 MED ORDER — DEXTROSE 5 % IV SOLN
750.0000 mg/m2 | Freq: Once | INTRAVENOUS | Status: DC
  Filled 2023-02-15 (×2): qty 75

## 2023-02-15 MED ORDER — ONDANSETRON HCL 4 MG PO TABS
8.0000 mg | ORAL_TABLET | Freq: Once | ORAL | Status: AC
  Administered 2023-02-16: 8 mg via ORAL
  Filled 2023-02-15: qty 2

## 2023-02-15 NOTE — Progress Notes (Signed)
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  Pt with hx including but not limited to Wegener's granulomatosis, ILD, former smoker, CAD/CABG, afib, CHF, DM2    PT Comments  Pt received sitting in the recliner and agreeable to session. Pt able to tolerate gait trial in the room this session, however requires one seated rest break due to DOE and dizziness. Pt required increase up to 10L during ambulation, but had difficulty obtaining stable O2 pleth during ambulation. Pt able to tolerate additional standing trial with static marches after a seated rest break, but demonstrates quick fatigue. Pt initially reported feeling that his BLE were weak and would "give out', however reports that they felt stronger during ambulation. Pt continues to benefit from PT services to progress toward functional mobility goals.    If plan is discharge home, recommend the following: Assistance with cooking/housework;Assist for transportation;Help with stairs or ramp for entrance   Can travel by private vehicle        Equipment Recommendations  None recommended by PT    Recommendations for Other Services       Precautions / Restrictions Precautions Precautions: Other (comment) Precaution/Restrictions Comments: watch sats Restrictions Weight Bearing Restrictions Per Provider Order: No     Mobility  Bed Mobility               General bed mobility comments: OOB in recliner upon arrival and departure    Transfers Overall transfer level: Needs assistance Equipment used: Rolling walker (2 wheels) Transfers: Sit to/from Stand Sit to Stand: Min assist, Contact guard assist           General transfer comment: cues for hand placement and anterior weight shift    Ambulation/Gait Ambulation/Gait assistance:  Contact guard assist Gait Distance (Feet): 5 Feet (+20) Assistive device: Rolling walker (2 wheels) Gait Pattern/deviations: Step-through pattern, Trunk flexed, Decreased stride length       General Gait Details: Slow gait with heavy RW support. Cues for RW proximity and upright posture      Balance Overall balance assessment: Mild deficits observed, not formally tested                                          Communication Communication Communication: No apparent difficulties  Cognition Arousal: Alert Behavior During Therapy: WFL for tasks assessed/performed                                    Cueing    Exercises General Exercises - Lower Extremity Hip Flexion/Marching: AROM, Both, 10 reps, Standing    General Comments General comments (skin integrity, edema, etc.): SpO2 93% on 5L upon entry at rest. Pt requires increase up to 10L on portable tank during ambulation to maintain SpO2 >90% due to desat as low as 80%, however had difficulty obtaining stable pleth during mobility. Pt returned to 5L at end of session with SpO2 at 90%      Pertinent Vitals/Pain Pain Assessment Pain Assessment: No/denies pain     PT Goals (current goals can now be found in the care plan section) Acute Rehab PT Goals Patient Stated Goal: home PT Goal Formulation:  With patient Time For Goal Achievement: 02/18/23 Progress towards PT goals: Progressing toward goals    Frequency    Min 1X/week       AM-PAC PT "6 Clicks" Mobility   Outcome Measure  Help needed turning from your back to your side while in a flat bed without using bedrails?: None Help needed moving from lying on your back to sitting on the side of a flat bed without using bedrails?: None Help needed moving to and from a bed to a chair (including a wheelchair)?: A Little Help needed standing up from a chair using your arms (e.g., wheelchair or bedside chair)?: A Little Help needed to walk in  hospital room?: A Little Help needed climbing 3-5 steps with a railing? : A Little 6 Click Score: 20    End of Session Equipment Utilized During Treatment: Oxygen;Gait belt Activity Tolerance: Patient limited by fatigue;Other (comment) Patient left: with call bell/phone within reach;in chair Nurse Communication: Mobility status PT Visit Diagnosis: Difficulty in walking, not elsewhere classified (R26.2)     Time: 0454-0981 PT Time Calculation (min) (ACUTE ONLY): 37 min  Charges:    $Gait Training: 8-22 mins $Therapeutic Exercise: 8-22 mins PT General Charges $$ ACUTE PT VISIT: 1 Visit                     Johny Shock, PTA Acute Rehabilitation Services Secure Chat Preferred  [phone]    Johny Shock 02/15/2023, 10:58 AM

## 2023-02-15 NOTE — Progress Notes (Signed)
NAME:  Gary [name], MRN:  [id number], DOB:  1945/07/27, LOS: 12 ADMISSION DATE:  02/03/2023, CONSULTATION DATE:  02/03/2023 REFERRING MD:  Dr. Susa Raring, CHIEF COMPLAINT:  dyspnea, hypoxia   History of Present Illness:  This 78 year old Caucasian male is seen in consultation at the request of Dr. Susa Raring for recommendations on further evaluation and management of hypoxia.  The patient was sent to [facility] emergency department from [facility] pulmonary clinic today, after he presented with reports of having profound hypoxia at home (down to SpO2 45%).  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  He is on rituximab and steroids.  In fact, he was recently hospitalized for interstitial lung disease flareup and has been on a prednisone taper, currently on prednisone 40 mg p.o. daily.  Treated with high-dose steroids.  Right heart cath on 01/04/2023 showed normal filling pressures, mild pulmonary hypertension and preserved cardiac output.  He reported doing well after his recent hospitalization and felt that he had a very good day yesterday.  However, this morning he woke up and experienced extreme exertional dyspnea while getting from his bed to the bathroom, when he noted that his SpO2 went down to 45%.  He is normally on supplemental oxygen at 2-2.5 LPM via nasal cannula.  He does titrate as needed to maintain SpO2 90%.  However, he noted that despite turning his supplemental oxygen up to 6 LPM (which he typically needs during ambulation), he was not recovering.  He denies cough.  He denies pleuritic pain.  He denies sputum production.  He denies chest pain.  He does state that when he was profoundly hypoxic he did notice that he was experiencing blurring of vision.  He has noted some modest increased swelling in his lower extremities.  He does confess that he is prescribed daily furosemide but is inconsistent with adherence to  prescribed therapy.  Patient says he has had a very stressful and very active week, which revolves around having to get to multiple medical appointments.  He had an iron infusion a few days ago.  He also had a Rituxan infusion earlier this week.  Patient says he has been having blood in his stools, although he says that this is not any more than usual.  This has been a chronic problem ever since his diagnosis of Wegener's granulomatosis.    He is on Coumadin for chronic atrial fibrillation.  His last INR check (01/24/2023) was supratherapeutic, 6.7.  However, upon recheck in the emergency department, this has fallen into the therapeutic range, 2.7.  Last hemoglobin reported by patient was 8.2, currently 8.7.   Pertinent  Medical History  PAST MEDICAL history significant for Wegener's granulomatosis (diagnosed 2019, on Rituxan and steroids), type 2 diabetes mellitus, OSA on CPAP, atrial fibrillation on coumadin, HFpEF (LVEF 65 in 12/28/2022, grade 1 diastolic dysfunction), pulmonary hypertension, coronary artery disease.  Significant Hospital Events: Including procedures, antibiotic start and stop dates in addition to other pertinent events   1/30 presented with shortness of breath and hypoxia to steroids started, solumedrol 125mg  q12 1/31 states he feels well but is requiring 8 to 10 L high flow nasal cannula 2/1 Started Cellcept 500mg  bid 2/2 no acute issues overnight, remains on 6 L high flow 2/7 Steroids reduced to solumedrol 125 mg daily 2/9 Prednisone 30mg  IV bid 2/10 Cellcept increased to 1000mg  bid  Interim History / Subjective:   Remains on 5 to 8 L oxygen.  Still has desats on exertion  Objective   Blood pressure 126/63, pulse 78, temperature 97.8 F (36.6 C), temperature source Oral, resp. rate (!) 23, height 5\' 10"  (1.778 m), weight 88.9 kg, SpO2 98%.        Intake/Output Summary (Last 24 hours) at 02/15/2023 0955 Last data filed at 02/15/2023 2130 Gross per 24 hour  Intake  320 ml  Output 250 ml  Net 70 ml   Filed Weights   02/11/23 0848 02/14/23 0500 02/15/23 0500  Weight: 96.8 kg 89.8 kg 88.9 kg    Examination: Gen:      No acute distress HEENT:  EOMI, sclera anicteric Neck:     No masses; no thyromegaly Lungs:    Bilateral rhonchi CV:         Regular rate and rhythm; no murmurs Abd:      + bowel sounds; soft, non-tender; no palpable masses, no distension Ext:    No edema; adequate peripheral perfusion Skin:      Warm and dry; no rash Neuro: alert and oriented x 3 Psych: normal mood and affect    Resolved Hospital Problem list   Not applicable.  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 Desaturation today with agitation Neg 8.475  liters, BNP 155, Pro calcitonin has slightly reduced.( 0.36 from 0.54), CRP 12.7 Bubble study neg. EF good   Plan He is s/p high dose steroids 125 mg bid x 7 days and reduced to 125 mg daily on 2/7, and to prednisone 30 twice daily on 2/9 On CellCept 1 g twice daily Discussed with [name] his primary pulmonologist.  Since there is no improvement we will consider Cytoxan infusion which is tentatively scheduled for tomorrow 2/12 BNP, 155, Net negative 8500 cc's,  Cont to wean O2 as able Encourage PO intake Cont lasix as BP/BUN and cr allow Goal is to get to 2 lpm prior to dc  Cont dapsone for PCP prophylaxis  May need to  consider palliative eval if no improvement  Discussed in detail with patient and family at bedside.  Signature:   Chilton Greathouse MD [facility] See Amion for pager  If no response to pager , please call [phone] until 7pm After 7:00 pm call Elink  [phone] 02/15/2023, 9:55 AM

## 2023-02-15 NOTE — Progress Notes (Signed)
Consult from pt's nurse with chemo questions. Confirmed with pharmacy chemotherapy is scheduled for 2/12 and chemo team is aware. Maralyn Sago, RN made aware.

## 2023-02-15 NOTE — Progress Notes (Addendum)
PROGRESS NOTE        PATIENT DETAILS Name: Gary [name] Age: 78 y.o. Sex: male Date of Birth: [birth year] Admit Date: 02/03/2023 Admitting Physician Leroy Sea, MD ZOX:WRUEAV, [name], DO  Brief Summary: Patient is a 78 y.o.  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 2/06>> Echo:EF 60-65%, agitated saline contrast bubble study negative-normal evidence of interatrial shunt.  Significant microbiology data: 1/30>> respiratory virus panel: Negative 1/30>> COVID/influenza/RSV PCR: Negative  Procedures: None  Consults: PCCM  Subjective:  Patient in bed, appears comfortable, denies any headache, no fever, no chest pain or pressure,, improvement in his shortness of breath , no abdominal pain. No new focal weakness.  Objective: Vitals: Blood pressure 107/67, pulse 76, temperature 97.8 F (36.6 C), temperature source Oral, resp. rate 20, height 5\' 10"  (1.778 m), weight 88.9 kg, SpO2 91%.   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 Although hypoxia slowly improving-he continues to have severe exertional dyspnea with minimal activity with significant drops in saturation-with slow but gradual recovery with rest.  He seems a bit more fatigued/tired today. Remains on CellCept ( dose doubled on 02/14/23) was on Solu-Medrol currently on prednisone total 60 a day per PCCM continue this dose for now, possible Cytoxan infusion in a day or 2 by PCCM.  Case discussed with Dr. Isaiah Serge PCCM on 02/15/2023. As needed Lasix, last dose 02/14/2023 Echo without cardiac shunt-stable EF 60%. Continue attempts to slowly titrate down FiO2. PCCM continues to follow, he continues to feel poorly, requested PCCM to reevaluate him on 02/14/2023  SpO2: 91 % O2 Flow Rate (L/min): 5 L/min FiO2 (%): 40 %    History of granulomatosis with polyangiitis (GPA) Was on tapering steroids (down to 40 mg)-when he developed worsening respiratory failure Prophylactic dapsone for PJP (no Bactrim as interaction with Tikosyn) PCCM started CellCept-no longer on Rituxan Currently on IV Solu-Medrol-dose reduced from twice daily to daily on 2/7.  Paroxysmal atrial fibrillation Maintaining sinus rhythm Tikosyn/Coreg Has been transitioned to Eliquis-no plans to restart Coumadin on discharge.  Chronic HFpEF Euvolemic-on Lasix to ensure negative balance.  History of CAD s/p PCI September 2023 Aspirin No longer on Plavix-this was also not on his most recent discharge list of medications.  Iron deficiency anemia Appreciate GI input-plans are for outpatient endoscopy evaluation once his respiratory status improves-No overt bleeding noted-some drop in Hb but no obvious evidence of GI bleed.  Given IV iron in 2/4-follow CBC  Hypothyroidism Synthroid  Mood disorder Stable Continue Effexor   DM-2 with  steroid related hyperglycemia CBGs continue to fluctuate-elevated yesterday-borderline hypoglycemic today Decrease Semglee to 10 units Continue  SSI Follow-up with   Recent Labs    02/14/23 1619 02/14/23 2118 02/15/23 0808  GLUCAP 218* 283* 92     BMI: Estimated body mass index is 28.12 kg/m as calculated from the following:   Height as of this encounter: 5\' 10"  (1.778 m).   Weight as of this encounter: 88.9 kg.   Code status:   Code Status: Full Code   DVT Prophylaxis: SCDs Start: 02/03/23 1523 apixaban (ELIQUIS) tablet 5 mg    Family Communication:Spouse [name]-[phone] on 2/4   Disposition Plan: Status is: Inpatient Remains inpatient appropriate because: Severity of illness   Planned Discharge Destination:Home health   Diet: Diet Order             Diet regular Fluid consistency: Thin  Diet effective now                     Antimicrobial agents: Anti-infectives (From admission, onward)    Start     Dose/Rate Route Frequency Ordered Stop   02/03/23 1445  dapsone tablet 100 mg        100 mg Oral Daily 02/03/23 1442          MEDICATIONS: Scheduled Meds:  ALPRAZolam  0.5 mg Oral QHS   apixaban  5 mg Oral BID   aspirin  81 mg Oral Daily   carvedilol  3.125 mg Oral BID WC   dapsone  100 mg Oral Daily   dofetilide  500 mcg Oral BID   dronabinol  5 mg Oral QAC lunch   feeding supplement (GLUCERNA SHAKE)  237 mL Oral BID BM   insulin aspart  0-20 Units Subcutaneous TID WC   insulin aspart  0-5 Units Subcutaneous QHS   insulin aspart  4 Units Subcutaneous TID WC   insulin glargine-yfgn  12 Units Subcutaneous Daily   levothyroxine  137 mcg Oral Q0600   mycophenolate  1,000 mg Oral BID   pantoprazole  40 mg Oral BID   predniSONE  30 mg Oral BID WC   venlafaxine XR  150 mg Oral Daily   Continuous Infusions:   PRN Meds:.acetaminophen **OR** acetaminophen, bisacodyl, chlorpheniramine-HYDROcodone, metoprolol tartrate, nitroGLYCERIN, mouth rinse, promethazine   I have personally reviewed following labs and imaging studies  LABORATORY DATA:   Data Review:    Recent Labs  Lab  02/11/23 0527 02/12/23 0506 02/13/23 0544 02/14/23 0514 02/15/23 0501  WBC 11.7* 12.7* 9.9 8.9 8.8  HGB 7.8* 8.6* 8.6* 8.8* 8.7*  HCT 27.5* 29.8* 28.9* 29.9* 30.2*  PLT 306 322 314 310 395  MCV 89.6 90.6 89.5 89.8 89.3  MCH 25.4* 26.1 26.6 26.4 25.7*  MCHC 28.4* 28.9* 29.8* 29.4* 28.8*  RDW 25.5* 26.1* 25.8* 25.6* 25.2*  LYMPHSABS  --   --  0.7 0.4* 0.6*  MONOABS  --   --  0.3 0.2 0.4  EOSABS  --   --  0.0 0.0 0.0  BASOSABS  --   --  0.0 0.0 0.0    Recent Labs  Lab 02/09/23 1433 02/10/23 0856 02/11/23 0527 02/12/23 0506 02/12/23 1133 02/13/23 0544 02/13/23 0748 02/14/23 0514 02/15/23 0501  NA  --    < > 140 139  --  141  --  142 140  K  --    < > 4.5 4.0  --  4.3  --  4.2 4.5  CL  --    < >  101 99  --  99  --  92* 99  CO2  --    < > 29 30  --  31  --  33* 29  ANIONGAP  --    < > 10 10  --  11  --  17* 12  GLUCOSE  --    < > 199* 75  --  151*  --  96 143*  BUN  --    < > 40* 37*  --  38*  --  29* 36*  CREATININE  --    < > 0.78 0.91  --  0.86  --  0.80 0.98  CRP 12.4*  --   --   --   --  11.1*  --  12.7* 15.8*  PROCALCITON 0.24  --   --   --  0.50 0.54  --  0.36 0.46  BNP  --   --   --   --  183.8*  --  226.7* 155.0* 174.3*  MG  --   --   --   --   --  2.3  --  2.4 2.3  PHOS  --   --   --   --   --  2.6  --  3.2 3.3  CALCIUM  --    < > 9.4 9.5  --  9.6  --  10.2 9.4   < > = values in this interval not displayed.      Recent Labs  Lab 02/09/23 1433 02/10/23 0856 02/11/23 0527 02/12/23 0506 02/12/23 1133 02/13/23 0544 02/13/23 0748 02/14/23 0514 02/15/23 0501  CRP 12.4*  --   --   --   --  11.1*  --  12.7* 15.8*  PROCALCITON 0.24  --   --   --  0.50 0.54  --  0.36 0.46  BNP  --   --   --   --  183.8*  --  226.7* 155.0* 174.3*  MG  --   --   --   --   --  2.3  --  2.4 2.3  CALCIUM  --    < > 9.4 9.5  --  9.6  --  10.2 9.4   < > = values in this interval not displayed.     --------------------------------------------------------------------------------------------------------------- Lab Results  Component Value Date   CHOL 155 05/26/2020   HDL 45 05/26/2020   LDLCALC 79 05/26/2020   TRIG 184 (H) 05/26/2020   CHOLHDL 3.4 05/26/2020    Lab Results  Component Value Date   HGBA1C 5.9 (H) 01/02/2023   Radiology Reports DG Chest Port 1 View Result Date: 02/12/2023 CLINICAL DATA:  Interstitial lung disease EXAM: PORTABLE CHEST 1 VIEW COMPARISON:  02/09/2023 FINDINGS: Single frontal view of the chest demonstrates postsurgical changes from CABG. Cardiac silhouette is enlarged but stable. Diffuse interstitial lung disease is again identified, with bilateral fibrotic changes and scattered ground-glass opacities most pronounced in the right upper and left lower lung zones similar to prior CT exam. No new consolidation, effusion, or pneumothorax. No acute bony abnormalities. IMPRESSION: 1. Stable findings of chronic interstitial lung disease, with fibrosis and ground-glass opacities unchanged since recent high-resolution chest CT 01/07/2023. 2. No evidence of acute intrathoracic process. Electronically Signed   By: Sharlet Salina M.D.   On: 02/12/2023 15:32   ECHOCARDIOGRAM LIMITED BUBBLE STUDY Result Date: 02/10/2023    ECHOCARDIOGRAM LIMITED REPORT   Patient Name:   Gary [name] Date of Exam: 02/10/2023 Medical Rec #:  [id number]         Height:       70.0 in Accession #:    [id number]        Weight:       201.7 lb Date of Birth:  30-Aug-1945          BSA:          2.095 m Patient Age:    19 years          BP:           149/81 mmHg Patient Gender: M                 HR:           75 bpm. Exam Location:  Inpatient Procedure: Limited Echo, Cardiac Doppler, Color Doppler and Saline Contrast            Bubble Study Indications:    ASD  History:        Patient has prior history of Echocardiogram examinations, most                 recent 12/07/2022. CAD; Risk Factors:Hypertension,  Diabetes,                 Dyslipidemia, Former Smoker and Sleep Apnea.  Sonographer:    Karma Ganja Referring Phys: [id number] Vinnie Level SMITH IMPRESSIONS  1. Left ventricular ejection fraction, by estimation, is 60 to 65%. The left ventricle has normal function. The left ventricle has no regional wall motion abnormalities. There is moderate concentric left ventricular hypertrophy. Left ventricular diastolic function could not be evaluated.  2. Right ventricular systolic function is normal. The right ventricular size is normal. There is moderately elevated pulmonary artery systolic pressure.  3. The mitral valve is degenerative. Mild mitral valve regurgitation. No evidence of mitral stenosis. Moderate mitral annular calcification.  4. The aortic valve is calcified. There is mild calcification of the aortic valve. Aortic valve regurgitation is not visualized. Aortic valve sclerosis/calcification is present, without any evidence of aortic stenosis.  5. The inferior vena cava is normal in size with greater than 50% respiratory variability, suggesting right atrial pressure of 3 mmHg.  6. Agitated saline contrast bubble study was negative, with no evidence of any interatrial shunt. FINDINGS  Left Ventricle: Left ventricular ejection fraction, by estimation, is 60 to 65%. The left ventricle has normal function. The left ventricle has no regional wall motion abnormalities. The left ventricular internal cavity size was normal in size. There is  moderate concentric left ventricular hypertrophy. Left ventricular diastolic function could not be evaluated. Left ventricular diastolic function could not be evaluated due to mitral annular calcification (moderate or greater). Right Ventricle: The right ventricular size is normal. No increase in right ventricular wall thickness. Right ventricular systolic function is normal. There is moderately elevated pulmonary artery systolic pressure. The tricuspid regurgitant velocity is 3.58 m/s,  and with an assumed right atrial pressure of 3 mmHg, the estimated right ventricular systolic pressure is 54.3 mmHg. Left Atrium: Left atrial size was not assessed. Right Atrium: Right atrial size was not assessed. Pericardium: There is no evidence of pericardial effusion. Mitral Valve: The mitral valve is degenerative in appearance. Moderate mitral annular calcification. Mild mitral valve regurgitation. No evidence of mitral valve stenosis. Tricuspid Valve: The tricuspid valve is normal in structure. Tricuspid valve regurgitation is mild . No evidence of tricuspid stenosis. Aortic Valve: The aortic valve is calcified. There is mild calcification of the aortic valve. Aortic  valve regurgitation is not visualized. Aortic valve sclerosis/calcification is present, without any evidence of aortic stenosis. Pulmonic Valve: The pulmonic valve was not well visualized. Pulmonic valve regurgitation is not visualized. No evidence of pulmonic stenosis. Aorta: Aortic root could not be assessed. Venous: The inferior vena cava is normal in size with greater than 50% respiratory variability, suggesting right atrial pressure of 3 mmHg. IAS/Shunts: No atrial level shunt detected by color flow Doppler. Agitated saline contrast was given intravenously to evaluate for intracardiac shunting. Agitated saline contrast bubble study was negative, with no evidence of any interatrial shunt. Additional Comments: Spectral Doppler performed. Color Doppler performed.  TRICUSPID VALVE TR Peak grad:   51.3 mmHg TR Vmax:        358.00 cm/s Lavona Mound Tobb DO Electronically signed by Thomasene Ripple DO Signature Date/Time: 02/10/2023/12:47:16 PM    Final    DG Chest Port 1 View Result Date: 02/09/2023 CLINICAL DATA:  Respiratory failure. EXAM: PORTABLE CHEST 1 VIEW COMPARISON:  02/03/2023 FINDINGS: Low volume film. The cardio pericardial silhouette is enlarged. Diffuse interstitial opacity again noted, progressive in the parahilar left mid lung. No substantial  pleural effusion. No acute bony abnormality. Telemetry leads overlie the chest. IMPRESSION: Low volume film with progressive interstitial opacity in the parahilar left mid lung. Imaging features could be related to asymmetric edema or infection. Electronically Signed   By: Kennith Center M.D.   On: 02/09/2023 08:54   DG Chest Portable 1 View Result Date: 02/03/2023 CLINICAL DATA:  Shortness of breath. Hypoxia. History of Wegener's and interstitial lung disease. EXAM: PORTABLE CHEST 1 VIEW COMPARISON:  Radiographs 01/05/2023 and 01/01/2023.  CT 01/07/2023. FINDINGS: 1252 hours. The heart size and mediastinal contours are stable status post median sternotomy and CABG. Chronic interstitial lung disease again noted with possible slight worsening on the right which could indicate superimposed edema or inflammation. There is no consolidation, pneumothorax or significant pleural effusion. The bones appear unchanged. IMPRESSION: Chronic interstitial lung disease with possible slight worsening on the right which could indicate superimposed edema or inflammation. Electronically Signed   By: Carey Bullocks M.D.   On: 02/03/2023 14:01   CT Chest High Resolution Result Date: 01/07/2023 CLINICAL DATA:  Inpatient. Diffuse/interstitial lung disease ild flare EXAM: CT CHEST WITHOUT CONTRAST TECHNIQUE: Multidetector CT imaging of the chest was performed following the standard protocol without intravenous contrast. High resolution imaging of the lungs, as well as inspiratory and expiratory imaging, was performed. RADIATION DOSE REDUCTION: This exam was performed according to the departmental dose-optimization program which includes automated exposure control, adjustment of the mA and/or kV according to patient size and/or use of iterative reconstruction technique. COMPARISON:  01/05/2023 chest radiograph. 11/05/2022 high-resolution chest CT. FINDINGS: Cardiovascular: Stable mild cardiomegaly. No significant pericardial  effusion/thickening. Three-vessel coronary atherosclerosis status post CABG. Atherosclerotic nonaneurysmal thoracic aorta. Normal caliber pulmonary arteries. Mediastinum/Nodes: No significant thyroid nodules. Unremarkable esophagus. No axillary adenopathy. Mild right paratracheal adenopathy up to 1.2 cm (series 5/image 45), unchanged. No new pathologically enlarged mediastinal nodes. No discrete hilar adenopathy on these noncontrast images. Lungs/Pleura: No pneumothorax. No pleural effusion. No acute consolidative airspace disease, lung masses or significant pulmonary nodules. Stable mild patchy air trapping in both lungs on the expiration sequence, without evidence of tracheobronchomalacia. Extensive patchy peribronchovascular and subpleural reticulation and ground-glass opacity throughout both lungs with associated mild traction bronchiectasis and architectural distortion. No clear apicobasilar gradient to these findings. Scattered foci of mild honeycombing in both lungs, for example in the peripheral right lung on series 8/image  80 and in the peripheral lower left lung on series 8/image 98). Ground-glass opacities have worsened since 11/05/2022 chest CT. Findings are otherwise not appreciably changed. Upper abdomen: Cholelithiasis. Musculoskeletal: No aggressive appearing focal osseous lesions. Intact sternotomy wires. Moderate thoracic spondylosis. IMPRESSION: 1. Spectrum of findings compatible with fibrotic interstitial lung disease with mild honeycombing, with worsened diffuse patchy ground-glass opacity in the interval since 11/05/2022 CT. Acute flare of chronic fibrotic interstitial lung disease favored. As previously described, chronic hypersensitivity pneumonitis or fibrotic NSIP are favored. Findings are suggestive of an alternative diagnosis (not UIP) per consensus guidelines: Diagnosis of Idiopathic Pulmonary Fibrosis: An Official ATS/ERS/JRS/ALAT Clinical Practice Guideline. Am Rosezetta Schlatter Crit Care Med  Vol 198, Iss 5, [phone], Sep 04 2016. 2. Stable mild cardiomegaly. 3. Cholelithiasis. 4.  Aortic Atherosclerosis (ICD10-I70.0). Electronically Signed   By: Delbert Phenix M.D.   On: 01/07/2023 10:28   CARDIAC CATHETERIZATION Result Date: 01/06/2023 1. Normal filling pressures. 2. Mild pulmonary hypertension without elevated PVR. 3. Normal cardiac output.   VAS [place] LOWER EXTREMITY VENOUS (DVT) Result Date: 01/05/2023  Lower Venous DVT Study Patient Name:  LAROY MUSTARD [facility]  Date of Exam:   01/05/2023 Medical Rec #: [id number]          Accession #:    [id number] Date of Birth: Feb 23, 1945           Patient Gender: M Patient Age:   13 years Exam Location:  [facility] Procedure:      VAS [place] LOWER EXTREMITY VENOUS (DVT) Referring Phys: Levon Hedger --------------------------------------------------------------------------------  Indications: Edema.  Anticoagulation: Coumadin. Limitations: Poor ultrasound/tissue interface. Performing Technologist: Ernestene Mention RVT, RDMS  Examination Guidelines: A complete evaluation includes B-mode imaging, spectral Doppler, color Doppler, and power Doppler as needed of all accessible portions of each vessel. Bilateral testing is considered an integral part of a complete examination. Limited examinations for reoccurring indications may be performed as noted. The reflux portion of the exam is performed with the patient in reverse Trendelenburg.  +--------+---------------+---------+-----------+----------+--------------------+ RIGHT   CompressibilityPhasicitySpontaneityPropertiesThrombus Aging       +--------+---------------+---------+-----------+----------+--------------------+ CFV     Full           Yes      Yes                                       +--------+---------------+---------+-----------+----------+--------------------+ SFJ     Full                                                               +--------+---------------+---------+-----------+----------+--------------------+ FV Prox Full           Yes      Yes                                       +--------+---------------+---------+-----------+----------+--------------------+ FV Mid  Full           Yes      Yes                                       +--------+---------------+---------+-----------+----------+--------------------+  FV                     Yes      Yes                  patent by            Distal                                               color/doppler        +--------+---------------+---------+-----------+----------+--------------------+ PFV     Full                                                              +--------+---------------+---------+-----------+----------+--------------------+ POP     Full           Yes      Yes                                       +--------+---------------+---------+-----------+----------+--------------------+ PTV     Full                                                              +--------+---------------+---------+-----------+----------+--------------------+ PERO    Full                                                              +--------+---------------+---------+-----------+----------+--------------------+   +--------+---------------+---------+-----------+----------+--------------------+ LEFT    CompressibilityPhasicitySpontaneityPropertiesThrombus Aging       +--------+---------------+---------+-----------+----------+--------------------+ CFV     Full           Yes      Yes                                       +--------+---------------+---------+-----------+----------+--------------------+ SFJ     Full                                                              +--------+---------------+---------+-----------+----------+--------------------+ FV Prox Full           Yes      Yes                                        +--------+---------------+---------+-----------+----------+--------------------+ FV Mid  Full           Yes  Yes                                       +--------+---------------+---------+-----------+----------+--------------------+ FV                     Yes      Yes                  patent by            Distal                                               color/doppler        +--------+---------------+---------+-----------+----------+--------------------+ PFV     Full                                                              +--------+---------------+---------+-----------+----------+--------------------+ POP     Full           Yes      Yes                                       +--------+---------------+---------+-----------+----------+--------------------+ PTV     Full                                                              +--------+---------------+---------+-----------+----------+--------------------+ PERO    Full                                                              +--------+---------------+---------+-----------+----------+--------------------+     Summary: BILATERAL: - No evidence of deep vein thrombosis seen in the lower extremities, bilaterally. -No evidence of popliteal cyst, bilaterally.   *See table(s) above for measurements and observations. Electronically signed by Lemar Livings MD on 01/05/2023 at 5:34:57 PM.    Final    DG Chest Port 1 View Result Date: 01/05/2023 CLINICAL DATA:  Acute respiratory failure. History of hypertension and coronary artery disease. EXAM: PORTABLE CHEST 1 VIEW COMPARISON:  01/01/2023 FINDINGS: Previous median sternotomy and CABG procedure. Lung volumes are low. Diffuse increased reticular and nodular interstitial opacities within both lungs compatible with previously characterized chronic fibrotic no superimposed pleural effusion, interstitial edema or airspace consolidation. Interstitial lung disease  IMPRESSION: 1. No acute findings. 2. Chronic interstitial lung disease. Electronically Signed   By: Signa Kell M.D.   On: 01/05/2023 12:24   DG Chest Port 1 View Result Date: 01/01/2023 CLINICAL DATA:  Shortness of breath. EXAM: PORTABLE CHEST 1 VIEW COMPARISON:  09/17/2022. FINDINGS: Low lung volume. There are diffuse increased interstitial markings, less pronounced  than the prior radiograph from 10/04/2022, which corresponds to chronic interstitial lung disease on recent chest CT scan from 11/05/2022. Bilateral lung fields are otherwise clear. No acute consolidation or lung collapse. Bilateral costophrenic angles are clear. Stable cardio-mediastinal silhouette. Sternotomy wires noted, status post CABG. No acute osseous abnormalities. The soft tissues are within normal limits. IMPRESSION: *No acute cardiopulmonary abnormality.  Interstitial lung disease. Electronically Signed   By: Jules Schick M.D.   On: 01/01/2023 17:19   ECHOCARDIOGRAM COMPLETE Result Date: 12/07/2022    ECHOCARDIOGRAM REPORT   Patient Name:   EJ PINSON Date of Exam: 12/07/2022 Medical Rec #:  [id number]         Height:       68.3 in Accession #:    [id number]        Weight:       221.0 lb Date of Birth:  [birth year]          BSA:          2.139 m Patient Age:    77 years          BP:           128/80 mmHg Patient Gender: M                 HR:           81 bpm. Exam Location:  [address] Procedure: 2D Echo, 3D Echo, Cardiac Doppler and Color Doppler Indications:    R06.00 Dyspnea  History:        Patient has prior history of Echocardiogram examinations, most                 recent 03/02/2022. CHF, CAD, Prior CABG, Arrythmias:Atrial                 Fibrillation, Signs/Symptoms:Dyspnea; Risk Factors:Hypertension,                 Dyslipidemia, Former Smoker, Family History of Coronary Artery                 Disease and Sleep Apnea. Interstitial Lung Disease, Wegener's                 Granulomatosis.  Sonographer:    Farrel Conners RDCS Referring Phys: Kalman Shan IMPRESSIONS  1. Left ventricular ejection fraction, by estimation, is 60 to 65%. The left ventricle has normal function. The left ventricle has no regional wall motion abnormalities. Left ventricular diastolic parameters are consistent with Grade I diastolic dysfunction (impaired relaxation).  2. Mid systolic notching of the pulmonary artery pressure tracing consistent with pulmonary hypertension. RVSP estimated at . . Right ventricular systolic function is normal. The right ventricular size is mildly enlarged.  3. Left atrial size was moderately dilated.  4. Right atrial size was severely dilated.  5. The mitral valve is normal in structure. Trivial mitral valve regurgitation. No evidence of mitral stenosis. Moderate mitral annular calcification.  6. The aortic valve is normal in structure. Aortic valve regurgitation is not visualized. No aortic stenosis is present.  7. Aortic dilatation noted. There is mild dilatation of the ascending aorta, measuring 41 mm.  8. The inferior vena cava is normal in size with greater than 50% respiratory variability, suggesting right atrial pressure of 3 mmHg. FINDINGS  Left Ventricle: Left ventricular ejection fraction, by estimation, is 60 to 65%. The left ventricle has normal function. The left ventricle has no regional wall motion abnormalities. The left ventricular internal cavity  size was normal in size. There is  no left ventricular hypertrophy. Left ventricular diastolic parameters are consistent with Grade I diastolic dysfunction (impaired relaxation). Right Ventricle: Mid systolic notching of the pulmonary artery pressure tracing consistent with pulmonary hypertension. RVSP estimated at . The right ventricular size is mildly enlarged. No increase in right ventricular wall thickness. Right ventricular systolic function is normal. Left Atrium: Left atrial size was moderately dilated. Right Atrium: Right atrial size  was severely dilated. Pericardium: There is no evidence of pericardial effusion. Mitral Valve: The mitral valve is normal in structure. Moderate mitral annular calcification. Trivial mitral valve regurgitation. No evidence of mitral valve stenosis. MV peak gradient, 5.7 mmHg. The mean mitral valve gradient is 2.0 mmHg. Tricuspid Valve: The tricuspid valve is normal in structure. Tricuspid valve regurgitation is not demonstrated. No evidence of tricuspid stenosis. Aortic Valve: The aortic valve is normal in structure. Aortic valve regurgitation is not visualized. No aortic stenosis is present. Aortic valve mean gradient measures 7.0 mmHg. Aortic valve peak gradient measures 13.0 mmHg. Aortic valve area, by VTI measures 2.51 cm. Pulmonic Valve: The pulmonic valve was normal in structure. Pulmonic valve regurgitation is not visualized. No evidence of pulmonic stenosis. Aorta: Aortic dilatation noted and the aortic root is normal in size and structure. There is mild dilatation of the ascending aorta, measuring 41 mm. Venous: The inferior vena cava is normal in size with greater than 50% respiratory variability, suggesting right atrial pressure of 3 mmHg. IAS/Shunts: No atrial level shunt detected by color flow Doppler.  LEFT VENTRICLE PLAX 2D LVIDd:         5.40 cm   Diastology LVIDs:         3.60 cm   LV e' medial:    5.87 cm/s LV PW:         1.10 cm   LV E/e' medial:  16.0 LV IVS:        1.10 cm   LV e' lateral:   10.30 cm/s LVOT diam:     2.60 cm   LV E/e' lateral: 9.1 LV SV:         99 LV SV Index:   46 LVOT Area:     5.31 cm                           3D Volume EF:                          3D EF:        60 %                          LV EDV:       187 ml                          LV ESV:       75 ml                          LV SV:        113 ml RIGHT VENTRICLE RV Basal diam:  4.50 cm RV Mid diam:    3.50 cm RV S prime:     16.10 cm/s TAPSE (M-mode): 2.3 cm RVSP:           59.2 mmHg LEFT ATRIUM  Index         RIGHT ATRIUM           Index LA diam:        5.30 cm  2.48 cm/m   RA Pressure: 3.00 mmHg LA Vol (A2C):   79.1 ml  36.99 ml/m  RA Area:     31.20 cm LA Vol (A4C):   112.0 ml 52.37 ml/m  RA Volume:   122.00 ml 57.05 ml/m LA Biplane Vol: 98.6 ml  46.11 ml/m  AORTIC VALVE AV Area (Vmax):    2.48 cm AV Area (Vmean):   2.44 cm AV Area (VTI):     2.51 cm AV Vmax:           180.00 cm/s AV Vmean:          121.000 cm/s AV VTI:            0.393 m AV Peak Grad:      13.0 mmHg AV Mean Grad:      7.0 mmHg LVOT Vmax:         84.10 cm/s LVOT Vmean:        55.550 cm/s LVOT VTI:          0.186 m LVOT/AV VTI ratio: 0.47  AORTA Ao Root diam: 3.40 cm Ao Asc diam:  4.05 cm MITRAL VALVE               TRICUSPID VALVE MV Area (PHT): cm         TR Peak grad:   56.2 mmHg MV Area VTI:   2.87 cm    TR Vmax:        375.00 cm/s MV Peak grad:  5.7 mmHg    Estimated RAP:  3.00 mmHg MV Mean grad:  2.0 mmHg    RVSP:           59.2 mmHg MV Vmax:       1.19 m/s MV Vmean:      55.7 cm/s   SHUNTS MV Decel Time: 219 msec    Systemic VTI:  0.19 m MV E velocity: 94.10 cm/s  Systemic Diam: 2.60 cm MV A velocity: 67.15 cm/s MV E/A ratio:  1.40 Clearnce Hasten Electronically signed by Clearnce Hasten Signature Date/Time: 12/07/2022/4:32:10 PM    Final       Signature  -   Susa Raring M.D on 02/15/2023 at 8:50 AM   -  To page go to [email or web address]

## 2023-02-16 ENCOUNTER — Other Ambulatory Visit (HOSPITAL_COMMUNITY): Payer: Self-pay

## 2023-02-16 DIAGNOSIS — J9621 Acute and chronic respiratory failure with hypoxia: Secondary | ICD-10-CM | POA: Diagnosis not present

## 2023-02-16 LAB — CBC WITH DIFFERENTIAL/PLATELET
Abs Immature Granulocytes: 0.13 10*3/uL — ABNORMAL HIGH (ref 0.00–0.07)
Basophils Absolute: 0 10*3/uL (ref 0.0–0.1)
Basophils Relative: 0 %
Eosinophils Absolute: 0 10*3/uL (ref 0.0–0.5)
Eosinophils Relative: 0 %
HCT: 28.4 % — ABNORMAL LOW (ref 39.0–52.0)
Hemoglobin: 8.3 g/dL — ABNORMAL LOW (ref 13.0–17.0)
Immature Granulocytes: 2 %
Lymphocytes Relative: 5 %
Lymphs Abs: 0.4 10*3/uL — ABNORMAL LOW (ref 0.7–4.0)
MCH: 26.1 pg (ref 26.0–34.0)
MCHC: 29.2 g/dL — ABNORMAL LOW (ref 30.0–36.0)
MCV: 89.3 fL (ref 80.0–100.0)
Monocytes Absolute: 0.3 10*3/uL (ref 0.1–1.0)
Monocytes Relative: 4 %
Neutro Abs: 7.2 10*3/uL (ref 1.7–7.7)
Neutrophils Relative %: 89 %
Platelets: 367 10*3/uL (ref 150–400)
RBC: 3.18 MIL/uL — ABNORMAL LOW (ref 4.22–5.81)
RDW: 24.7 % — ABNORMAL HIGH (ref 11.5–15.5)
WBC: 8 10*3/uL (ref 4.0–10.5)
nRBC: 0.7 % — ABNORMAL HIGH (ref 0.0–0.2)

## 2023-02-16 LAB — BASIC METABOLIC PANEL
Anion gap: 11 (ref 5–15)
BUN: 32 mg/dL — ABNORMAL HIGH (ref 8–23)
CO2: 30 mmol/L (ref 22–32)
Calcium: 9 mg/dL (ref 8.9–10.3)
Chloride: 96 mmol/L — ABNORMAL LOW (ref 98–111)
Creatinine, Ser: 0.78 mg/dL (ref 0.61–1.24)
GFR, Estimated: >60 mL/min (ref >60)
Glucose, Bld: 101 mg/dL — ABNORMAL HIGH (ref 70–99)
Potassium: 3.8 mmol/L (ref 3.5–5.1)
Sodium: 137 mmol/L (ref 135–145)

## 2023-02-16 LAB — GLUCOSE, CAPILLARY
Glucose-Capillary: 247 mg/dL — ABNORMAL HIGH (ref 70–99)
Glucose-Capillary: 201 mg/dL — ABNORMAL HIGH (ref 70–99)
Glucose-Capillary: 98 mg/dL (ref 70–99)
Glucose-Capillary: 288 mg/dL — ABNORMAL HIGH (ref 70–99)

## 2023-02-16 LAB — C-REACTIVE PROTEIN: CRP: 12 mg/dL — ABNORMAL HIGH (ref <1.0)

## 2023-02-16 LAB — MAGNESIUM: Magnesium: 2.3 mg/dL (ref 1.7–2.4)

## 2023-02-16 LAB — BRAIN NATRIURETIC PEPTIDE: B Natriuretic Peptide: 106.4 pg/mL — ABNORMAL HIGH (ref 0.0–100.0)

## 2023-02-16 LAB — PHOSPHORUS: Phosphorus: 3.6 mg/dL (ref 2.5–4.6)

## 2023-02-16 LAB — PROCALCITONIN: Procalcitonin: 0.2 ng/mL

## 2023-02-16 MED ORDER — DEXTROSE 5 % IV SOLN
750.0000 mg/m2 | Freq: Once | INTRAVENOUS | Status: AC
  Administered 2023-02-16: 1500 mg via INTRAVENOUS
  Filled 2023-02-16: qty 75

## 2023-02-16 MED ORDER — ONDANSETRON HCL 4 MG PO TABS
8.0000 mg | ORAL_TABLET | ORAL | Status: AC
  Administered 2023-02-16 (×2): 8 mg via ORAL
  Filled 2023-02-16 (×3): qty 2

## 2023-02-16 MED ORDER — SODIUM CHLORIDE 0.9 % IV SOLN
187.5000 mg/m2 | Freq: Once | INTRAVENOUS | Status: AC
  Administered 2023-02-16: 400 mg via INTRAVENOUS
  Filled 2023-02-16: qty 4

## 2023-02-16 MED ORDER — HYDROCOD POLI-CHLORPHE POLI ER 10-8 MG/5ML PO SUER
5.0000 mL | Freq: Two times a day (BID) | ORAL | Status: DC | PRN
  Administered 2023-02-16 – 2023-02-19 (×3): 5 mL via ORAL
  Filled 2023-02-16 (×3): qty 5

## 2023-02-16 NOTE — TOC Initial Note (Signed)
Transition of Care [facility]) - Initial/Assessment Note    Patient Details  Name: Gary [name] MRN: [id number] Date of Birth: Sep 18, 1945  Transition of Care [facility]) CM/SW Contact:    Lamonte Sakai, Student-Social Work Phone Number: 02/16/2023, 11:46 AM  Clinical Narrative:                  Pt admitted from home with spouse. Pt currently on 4L of oxygen. TOC following for home health needs.       Patient Goals and CMS Choice            Expected Discharge Plan and Services       Living arrangements for the past 2 months: Single Family Home                                      Prior Living Arrangements/Services Living arrangements for the past 2 months: Single Family Home Lives with:: Spouse                   Activities of Daily Living   ADL Screening (condition at time of admission) Independently performs ADLs?: No Does the patient have a NEW difficulty with bathing/dressing/toileting/self-feeding that is expected to last >3 days?: No (needs assist due to shortness of breath) Does the patient have a NEW difficulty with getting in/out of bed, walking, or climbing stairs that is expected to last >3 days?: No (needs assist due to shortness of breath) Does the patient have a NEW difficulty with communication that is expected to last >3 days?: No Is the patient deaf or have difficulty hearing?: No Does the patient have difficulty seeing, even when wearing glasses/contacts?: 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) Does the patient have difficulty concentrating, remembering, or making decisions?: No  Permission Sought/Granted                  Emotional Assessment       Orientation: : Oriented to Self, Oriented to Place, Oriented to  Time, Oriented to Situation      Admission diagnosis:  Acute and chronic respiratory failure with hypoxia (HCC) [J96.21] Acute hypoxemic respiratory failure (HCC) [J96.01] Patient Active Problem List   Diagnosis Date Noted   Acute  on chronic respiratory failure (HCC) 02/03/2023   Acute and chronic respiratory failure with hypoxia (HCC) 02/03/2023   Elevated troponin 01/02/2023   SIRS (systemic inflammatory response syndrome) (HCC) 01/02/2023   Leukocytosis 01/02/2023   Chronic iron deficiency anemia 01/02/2023   Allergic rhinitis 01/02/2023   Acquired hypothyroidism 01/02/2023   Depression 01/02/2023   GAD (generalized anxiety disorder) 01/02/2023   Acute hypoxemic respiratory failure (HCC) 09/17/2022   Interstitial lung disease (HCC) 09/17/2022   Encounter for monitoring dofetilide therapy 06/17/2022   Hypercoagulable state due to persistent atrial fibrillation (HCC) 05/11/2022   Persistent atrial fibrillation (HCC) 05/11/2022   CAP (community acquired pneumonia) 03/02/2022   Class 1 obesity 03/02/2022   Hypokalemia 03/02/2022   Acute respiratory failure with hypoxia (HCC) 03/01/2022   Atypical atrial flutter (HCC) 02/26/2022   Non-ST elevation (NSTEMI) myocardial infarction [facility])    Chest pain 09/25/2021   Chest pain due to coronary artery disease (HCC) 09/25/2021   Hypernatremia 03/10/2021   Long term (current) use of anticoagulants 02/12/2019   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 (HCC) 01/24/2019   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) 11/18/2017  Obstructive sleep apnea (adult) (pediatric) 11/09/2017   DM2 (diabetes mellitus, type 2) (HCC) 09/29/2017   Acute respiratory failure (HCC) 05/03/2017   Pulmonary edema cardiac cause (HCC) 05/03/2017   Respiratory failure (HCC) 05/03/2017   Chronic diastolic CHF (congestive heart failure) (HCC) 02/20/2016   Aortic aneurysm (HCC) 02/20/2016   CAD (coronary artery disease) 11/13/2014   Essential hypertension 11/13/2014   Hyperlipidemia 11/13/2014   Paroxysmal atrial fibrillation (HCC) 11/13/2014   Osteoarthritis of right hip 01/03/2012   PCP:  Charlane Ferretti, DO Pharmacy:    [facility] [phone] - [place], [place] - [address] [place] [address] Phone: [phone] Fax: [phone]  [name] [address] [place] [address] Phone: [phone] Fax: [phone]  [facility] [address] [place] [address] Phone: [phone] Fax: [phone]     Social Drivers of Health (SDOH) Social History: SDOH Screenings   Food Insecurity: No Food Insecurity (02/04/2023)  Housing: Low Risk  (02/04/2023)  Transportation Needs: No Transportation Needs (02/04/2023)  Utilities: Not At Risk (02/04/2023)  Social Connections: Moderately Isolated (02/04/2023)  Tobacco Use: Medium Risk (02/03/2023)   SDOH Interventions:     Readmission Risk Interventions    03/02/2022    4:16 PM  Readmission Risk Prevention Plan  Transportation Screening Complete  HRI or Home Care Consult Complete  Palliative Care Screening Not Applicable  Medication Review (RN Care Manager) Complete

## 2023-02-16 NOTE — Progress Notes (Signed)
NAME:  Gary [name], MRN:  [id number], DOB:  12-10-1945, LOS: 13 ADMISSION DATE:  02/03/2023, CONSULTATION DATE:  02/03/2023 REFERRING MD:  Dr. Susa Raring, CHIEF COMPLAINT:  dyspnea, hypoxia   History of Present Illness:  This 78 year old Caucasian male is seen in consultation at the request of Dr. Susa Raring for recommendations on further evaluation and management of hypoxia.  The patient was sent to [facility] emergency department from [facility] pulmonary clinic today, after he presented with reports of having profound hypoxia at home (down to SpO2 45%).  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  He is on rituximab and steroids.  In fact, he was recently hospitalized for interstitial lung disease flareup and has been on a prednisone taper, currently on prednisone 40 mg p.o. daily.  Treated with high-dose steroids.  Right heart cath on 01/04/2023 showed normal filling pressures, mild pulmonary hypertension and preserved cardiac output.  He reported doing well after his recent hospitalization and felt that he had a very good day yesterday.  However, this morning he woke up and experienced extreme exertional dyspnea while getting from his bed to the bathroom, when he noted that his SpO2 went down to 45%.  He is normally on supplemental oxygen at 2-2.5 LPM via nasal cannula.  He does titrate as needed to maintain SpO2 90%.  However, he noted that despite turning his supplemental oxygen up to 6 LPM (which he typically needs during ambulation), he was not recovering.  He denies cough.  He denies pleuritic pain.  He denies sputum production.  He denies chest pain.  He does state that when he was profoundly hypoxic he did notice that he was experiencing blurring of vision.  He has noted some modest increased swelling in his lower extremities.  He does confess that he is prescribed daily furosemide but is inconsistent with adherence to  prescribed therapy.  Patient says he has had a very stressful and very active week, which revolves around having to get to multiple medical appointments.  He had an iron infusion a few days ago.  He also had a Rituxan infusion earlier this week.  Patient says he has been having blood in his stools, although he says that this is not any more than usual.  This has been a chronic problem ever since his diagnosis of Wegener's granulomatosis.    He is on Coumadin for chronic atrial fibrillation.  His last INR check (01/24/2023) was supratherapeutic, 6.7.  However, upon recheck in the emergency department, this has fallen into the therapeutic range, 2.7.  Last hemoglobin reported by patient was 8.2, currently 8.7.   Pertinent  Medical History  PAST MEDICAL history significant for Wegener's granulomatosis (diagnosed 2019, on Rituxan and steroids), type 2 diabetes mellitus, OSA on CPAP, atrial fibrillation on coumadin, HFpEF (LVEF 65 in 12/28/2022, grade 1 diastolic dysfunction), pulmonary hypertension, coronary artery disease.  Significant Hospital Events: Including procedures, antibiotic start and stop dates in addition to other pertinent events   1/30 presented with shortness of breath and hypoxia to steroids started, solumedrol 125mg  q12 1/31 states he feels well but is requiring 8 to 10 L high flow nasal cannula 2/1 Started Cellcept 500mg  bid 2/2 no acute issues overnight, remains on 6 L high flow 2/7 Steroids reduced to solumedrol 125 mg daily 2/9 Prednisone 30mg  IV bid 2/10 Cellcept increased to 1000mg  bid 2/12 1 Dose cytoxan given 750mg /m2  Interim History / Subjective:   Remains on  5 to 8 L oxygen.  Still has desats on exertion  Objective   Blood pressure 125/60, pulse 78, temperature 98.7 F (37.1 C), temperature source Oral, resp. rate 20, height 5\' 10"  (1.778 m), weight 89.3 kg, SpO2 95%.    PEEP:  [7 cmH20] 7 cmH20   Intake/Output Summary (Last 24 hours) at 02/16/2023 1219 Last data  filed at 02/16/2023 0406 Gross per 24 hour  Intake --  Output 550 ml  Net -550 ml   Filed Weights   02/14/23 0500 02/15/23 0500 02/16/23 0543  Weight: 89.8 kg 88.9 kg 89.3 kg    Examination: Gen:      No acute distress. frail HEENT:  EOMI, sclera anicteric Neck:     No masses; no thyromegaly Lungs:    Bilateral rhonchi CV:         Regular rate and rhythm; no murmurs Abd:      + bowel sounds; soft, non-tender; no palpable masses, no distension Ext:    No edema; adequate peripheral perfusion Skin:      Warm and dry; no rash Neuro: alert and oriented x 3 Psych: normal mood and affect    Resolved Hospital Problem list   Not applicable.  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 Desaturation today with agitation Neg 8.475  liters, BNP 155, Pro calcitonin has slightly reduced.( 0.36 from 0.54), CRP 12.7 Bubble study neg. EF good   Plan He is s/p high dose steroids 125 mg bid x 7 days and reduced to 125 mg daily on 2/7, and to prednisone 30 twice daily on 2/9 On CellCept 1 g twice daily Discussed with [name] his primary pulmonologist.  Since there is no improvement we are giving Cytoxan infusion today Cont to wean O2 as able Encourage PO intake Cont lasix as BP/BUN and cr allow Goal is to get to 2 lpm prior to dc  Cont dapsone for PCP prophylaxis  May need to consider palliative eval if  no improvement  Discussed in detail with patient and family at bedside.  Signature:   Chilton Greathouse MD [facility] See Amion for pager  If no response to pager , please call [phone] until 7pm After 7:00 pm call Elink  [phone] 02/16/2023, 12:19 PM

## 2023-02-16 NOTE — Progress Notes (Signed)
Gary [name] received Cytoxan infusion today with no issues. Vital signs stable through out infusion. Patient tolerated treatment well.

## 2023-02-16 NOTE — Progress Notes (Signed)
OT Cancellation Note  Patient Details Name: Gary [name] MRN: [id number] DOB: 01/27/1945   Cancelled Treatment:    Reason Eval/Treat Not Completed: Patient at procedure or test/ unavailable (Patient beginning Chemo, OT to attempt later today as schedule permits.) Alfonse Flavors, OTA Acute Rehabilitation Services  Office [phone]  Dewain Penning 02/16/2023, 10:46 AM

## 2023-02-16 NOTE — Progress Notes (Signed)
   02/16/23 1026  Mobility  Activity Ambulated with assistance in room  Level of Assistance Contact guard assist, steadying assist  Assistive Device Four wheel walker  Distance Ambulated (ft) 30 ft  Activity Response Tolerated fair  Mobility Referral Yes  Mobility visit 1 Mobility  Mobility Specialist Start Time (ACUTE ONLY) 0959  Mobility Specialist Stop Time (ACUTE ONLY) 1026  Mobility Specialist Time Calculation (min) (ACUTE ONLY) 27 min   Mobility Specialist: Progress Note  Pre-Mobility:      SpO2 100% 6L During Mobility:  SpO2 82-95% 8L Post-Mobility:     SpO2 97% 6L  Pt agreeable to mobility session - received in chair. C/o SOB/ winded, pt requiring one seated break. Pt required 8LO2 per portable tank to keep maintain good SPO2%, desat to 82% when mobilizing. Returned to chair with all needs met - call bell within reach.   Barnie Mort, BS Mobility Specialist Please contact via SecureChat or  Rehab office at [phone].

## 2023-02-16 NOTE — Progress Notes (Signed)
PROGRESS NOTE        PATIENT DETAILS Name: Gary [name] Age: 78 y.o. Sex: male Date of Birth: 1945/06/26 Admit Date: 02/03/2023 Admitting Physician Leroy Sea, MD EXB:[id number], [name], DO  Brief Summary: Patient is a 78 y.o.  male with history of granulomatosis polyangiitis-ILD-chronic hypoxic respiratory failure on 3-4 L of oxygen at home, CAD s/p PCI September 2023, A-fib on warfarin, chronic HFpEF-recent hospitalization for ILD flare-on tapering steroids-presented with shortness of breath-found to have acute hypoxic respiratory failure-requiring HFNC-secondary to ILD flare (while on prednisone 40 mg daily)   Significant events: 1/30>> admit to [facility]  Significant studies: 1/30>> CXR: Worsening right-sided infiltrates. 2/06>> Echo:EF 60-65%, agitated saline contrast bubble study negative-normal evidence of interatrial shunt.  Significant microbiology data: 1/30>> respiratory virus panel: Negative 1/30>> COVID/influenza/RSV PCR: Negative  Procedures: None  Consults: PCCM  Subjective: Patient sitting in recliner denies any headache chest or abdominal pain, shortness of breath is gradually improving, no focal weakness.  Objective: Vitals: Blood pressure 125/60, pulse 78, temperature 98.7 F (37.1 C), resp. rate 20, height 5\' 10"  (1.778 m), weight 89.3 kg, SpO2 95%.   Exam:  Awake Alert, No new F.N deficits, Normal affect [name],PERRAL Supple Neck, No JVD,   Symmetrical Chest wall movement, Good air movement bilaterally, chronic bibasilar fine crackles RRR,No Gallops, Rubs or new Murmurs,  +ve B.Sounds, Abd Soft, No tenderness,   No Cyanosis, Clubbing or edema     Assessment/Plan:  Acute on chronic hypoxic respiratory failure secondary to GPA associated ILD (on home O2-anywhere from 3-4 L) Slowly improving-Down to 4-6 L of oxygen-at one point several days back-he was on 12 L of HFNC . Although hypoxia slowly improving-he continues to  have severe exertional dyspnea with minimal activity with significant drops in saturation-with slow but gradual recovery with rest.  He seems a bit more fatigued/tired today. Remains on CellCept ( dose doubled on 02/14/23) was on Solu-Medrol currently on prednisone total 60 a day per PCCM continue this dose for now, possible Cytoxan infusion in a day or 2 by PCCM.  Case discussed with Dr. Isaiah Serge PCCM on 02/15/2023. As needed Lasix, last dose 02/14/2023 Echo without cardiac shunt-stable EF 60%. Continue attempts to slowly titrate down FiO2. PCCM continues to follow, he continues to feel poorly, requested PCCM to reevaluate him on 02/14/2023  SpO2: 95 % O2 Flow Rate (L/min): 4 L/min FiO2 (%): 40 %    History of granulomatosis with polyangiitis (GPA) Was on tapering steroids (down to 40 mg)-when he developed worsening respiratory failure Prophylactic dapsone for PJP (no Bactrim as interaction with Tikosyn) PCCM started CellCept-no longer on Rituxan Currently on IV Solu-Medrol-dose reduced from twice daily to daily on 2/7.  Paroxysmal atrial fibrillation Maintaining sinus rhythm Tikosyn/Coreg Has been transitioned to Eliquis-no plans to restart Coumadin on discharge.  Chronic HFpEF Euvolemic-on Lasix to ensure negative balance.  History of CAD s/p PCI September 2023 Aspirin No longer on Plavix-this was also not on his most recent discharge list of medications.  Iron deficiency anemia Appreciate GI input-plans are for outpatient endoscopy evaluation once his respiratory status improves-No overt bleeding noted-some drop in Hb but no obvious evidence of GI bleed.  Given IV iron in 2/4-follow CBC  Hypothyroidism Synthroid  Mood disorder Stable Continue Effexor   DM-2 with steroid related hyperglycemia CBGs continue to fluctuate-elevated yesterday-borderline hypoglycemic today Decrease Semglee to  10 units Continue SSI Follow-up with   Recent Labs    02/15/23 1605 02/15/23 2114  02/16/23 0856  GLUCAP 125* 95 247*     BMI: Estimated body mass index is 28.24 kg/m as calculated from the following:   Height as of this encounter: 5\' 10"  (1.778 m).   Weight as of this encounter: 89.3 kg.   Code status:   Code Status: Full Code   DVT Prophylaxis: SCDs Start: 02/03/23 1523 apixaban (ELIQUIS) tablet 5 mg    Family Communication:Spouse [name] on 2/4   Disposition Plan: Status is: Inpatient Remains inpatient appropriate because: Severity of illness   Planned Discharge Destination:Home health   Diet: Diet Order             Diet regular Fluid consistency: Thin  Diet effective now                     Antimicrobial agents: Anti-infectives (From admission, onward)    Start     Dose/Rate Route Frequency Ordered Stop   02/03/23 1445  dapsone tablet 100 mg        100 mg Oral Daily 02/03/23 1442          MEDICATIONS: Scheduled Meds:  ALPRAZolam  0.5 mg Oral QHS   apixaban  5 mg Oral BID   aspirin  81 mg Oral Daily   carvedilol  3.125 mg Oral BID WC   cyclophosphamide (CYTOXAN) 1,500 mg in dextrose 5 % 150 mL chemo infusion  750 mg/m2 Intravenous Once   dapsone  100 mg Oral Daily   dofetilide  500 mcg Oral BID   dronabinol  5 mg Oral QAC lunch   feeding supplement (GLUCERNA SHAKE)  237 mL Oral BID BM   insulin aspart  0-20 Units Subcutaneous TID WC   insulin aspart  0-5 Units Subcutaneous QHS   insulin aspart  4 Units Subcutaneous TID WC   insulin glargine-yfgn  12 Units Subcutaneous Daily   levothyroxine  137 mcg Oral Q0600   mycophenolate  1,000 mg Oral BID   ondansetron  8 mg Oral Q4H   pantoprazole  40 mg Oral BID   predniSONE  30 mg Oral BID WC   venlafaxine XR  150 mg Oral Daily   Continuous Infusions:  dextrose 5 % and 0.45 % NaCl     mesna 400 mg (02/16/23 1038)   mesna 108 mL/hr at 02/16/23 1035    PRN Meds:.acetaminophen **OR** acetaminophen, bisacodyl, chlorpheniramine-HYDROcodone, metoprolol tartrate,  nitroGLYCERIN, mouth rinse, promethazine   I have personally reviewed following labs and imaging studies  LABORATORY DATA:   Data Review:    Recent Labs  Lab 02/13/23 0544 02/14/23 0514 02/15/23 0501 02/15/23 1857 02/16/23 0456  WBC 9.9 8.9 8.8 8.5 8.0  HGB 8.6* 8.8* 8.7* 8.0* 8.3*  HCT 28.9* 29.9* 30.2* 26.6* 28.4*  PLT 314 310 395 335 367  MCV 89.5 89.8 89.3 89.3 89.3  MCH 26.6 26.4 25.7* 26.8 26.1  MCHC 29.8* 29.4* 28.8* 30.1 29.2*  RDW 25.8* 25.6* 25.2* 25.2* 24.7*  LYMPHSABS 0.7 0.4* 0.6*  --  0.4*  MONOABS 0.3 0.2 0.4  --  0.3  EOSABS 0.0 0.0 0.0  --  0.0  BASOSABS 0.0 0.0 0.0  --  0.0    Recent Labs  Lab 02/09/23 1433 02/10/23 0856 02/12/23 1133 02/13/23 0544 02/13/23 0748 02/14/23 0514 02/15/23 0501 02/15/23 1857 02/16/23 0456  NA  --    < >  --  141  --  142 140 137 137  K  --    < >  --  4.3  --  4.2 4.5 3.9 3.8  CL  --    < >  --  99  --  92* 99 97* 96*  CO2  --    < >  --  31  --  33* 29 29 30   ANIONGAP  --    < >  --  11  --  17* 12 11 11   GLUCOSE  --    < >  --  151*  --  96 143* 126* 101*  BUN  --    < >  --  38*  --  29* 36* 34* 32*  CREATININE  --    < >  --  0.86  --  0.80 0.98 0.83 0.78  AST  --   --   --   --   --   --   --  33  --   ALT  --   --   --   --   --   --   --  59*  --   ALKPHOS  --   --   --   --   --   --   --  170*  --   BILITOT  --   --   --   --   --   --   --  0.5  --   ALBUMIN  --   --   --   --   --   --   --  2.6*  --   CRP 12.4*  --   --  11.1*  --  12.7* 15.8*  --  12.0*  PROCALCITON 0.24  --  0.50 0.54  --  0.36 0.46  --  0.20  BNP  --   --  183.8*  --  226.7* 155.0* 174.3*  --  106.4*  MG  --   --   --  2.3  --  2.4 2.3  --  2.3  PHOS  --   --   --  2.6  --  3.2 3.3  --  3.6  CALCIUM  --    < >  --  9.6  --  10.2 9.4 9.0 9.0   < > = values in this interval not displayed.      Recent Labs  Lab 02/09/23 1433 02/10/23 0856 02/12/23 1133 02/13/23 0544 02/13/23 0748 02/14/23 0514 02/15/23 0501  02/15/23 1857 02/16/23 0456  CRP 12.4*  --   --  11.1*  --  12.7* 15.8*  --  12.0*  PROCALCITON 0.24  --  0.50 0.54  --  0.36 0.46  --  0.20  BNP  --   --  183.8*  --  226.7* 155.0* 174.3*  --  106.4*  MG  --   --   --  2.3  --  2.4 2.3  --  2.3  CALCIUM  --    < >  --  9.6  --  10.2 9.4 9.0 9.0   < > = values in this interval not displayed.    --------------------------------------------------------------------------------------------------------------- Lab Results  Component Value Date   CHOL 155 05/26/2020   HDL 45 05/26/2020   LDLCALC 79 05/26/2020   TRIG 184 (H) 05/26/2020   CHOLHDL 3.4 05/26/2020    Lab Results  Component Value Date   HGBA1C 5.9 (H) 01/02/2023   Radiology Reports DG Chest [facility]  1 View Result Date: 02/12/2023 CLINICAL DATA:  Interstitial lung disease EXAM: PORTABLE CHEST 1 VIEW COMPARISON:  02/09/2023 FINDINGS: Single frontal view of the chest demonstrates postsurgical changes from CABG. Cardiac silhouette is enlarged but stable. Diffuse interstitial lung disease is again identified, with bilateral fibrotic changes and scattered ground-glass opacities most pronounced in the right upper and left lower lung zones similar to prior CT exam. No new consolidation, effusion, or pneumothorax. No acute bony abnormalities. IMPRESSION: 1. Stable findings of chronic interstitial lung disease, with fibrosis and ground-glass opacities unchanged since recent high-resolution chest CT 01/07/2023. 2. No evidence of acute intrathoracic process. Electronically Signed   By: Sharlet Salina M.D.   On: 02/12/2023 15:32   ECHOCARDIOGRAM LIMITED BUBBLE STUDY Result Date: 02/10/2023    ECHOCARDIOGRAM LIMITED REPORT   Patient Name:   RYLEE NUZUM Date of Exam: 02/10/2023 Medical Rec #:  [id number]         Height:       70.0 in Accession #:    [id number]        Weight:       201.7 lb Date of Birth:  [birth year]          BSA:          2.095 m Patient Age:    77 years          BP:           149/81  mmHg Patient Gender: M                 HR:           75 bpm. Exam Location:  Inpatient Procedure: Limited Echo, Cardiac Doppler, Color Doppler and Saline Contrast            Bubble Study Indications:    ASD  History:        Patient has prior history of Echocardiogram examinations, most                 recent 12/07/2022. CAD; Risk Factors:Hypertension, Diabetes,                 Dyslipidemia, Former Smoker and Sleep Apnea.  Sonographer:    Karma Ganja Referring Phys: [id number] Vinnie Level SMITH IMPRESSIONS  1. Left ventricular ejection fraction, by estimation, is 60 to 65%. The left ventricle has normal function. The left ventricle has no regional wall motion abnormalities. There is moderate concentric left ventricular hypertrophy. Left ventricular diastolic function could not be evaluated.  2. Right ventricular systolic function is normal. The right ventricular size is normal. There is moderately elevated pulmonary artery systolic pressure.  3. The mitral valve is degenerative. Mild mitral valve regurgitation. No evidence of mitral stenosis. Moderate mitral annular calcification.  4. The aortic valve is calcified. There is mild calcification of the aortic valve. Aortic valve regurgitation is not visualized. Aortic valve sclerosis/calcification is present, without any evidence of aortic stenosis.  5. The inferior vena cava is normal in size with greater than 50% respiratory variability, suggesting right atrial pressure of 3 mmHg.  6. Agitated saline contrast bubble study was negative, with no evidence of any interatrial shunt. FINDINGS  Left Ventricle: Left ventricular ejection fraction, by estimation, is 60 to 65%. The left ventricle has normal function. The left ventricle has no regional wall motion abnormalities. The left ventricular internal cavity size was normal in size. There is  moderate concentric left ventricular hypertrophy. Left ventricular diastolic function could not be evaluated. Left ventricular diastolic  function could not be evaluated due to mitral annular calcification (moderate or greater). Right Ventricle: The right ventricular size is normal. No increase in right ventricular wall thickness. Right ventricular systolic function is normal. There is moderately elevated pulmonary artery systolic pressure. The tricuspid regurgitant velocity is 3.58 m/s, and with an assumed right atrial pressure of 3 mmHg, the estimated right ventricular systolic pressure is 54.3 mmHg. Left Atrium: Left atrial size was not assessed. Right Atrium: Right atrial size was not assessed. Pericardium: There is no evidence of pericardial effusion. Mitral Valve: The mitral valve is degenerative in appearance. Moderate mitral annular calcification. Mild mitral valve regurgitation. No evidence of mitral valve stenosis. Tricuspid Valve: The tricuspid valve is normal in structure. Tricuspid valve regurgitation is mild . No evidence of tricuspid stenosis. Aortic Valve: The aortic valve is calcified. There is mild calcification of the aortic valve. Aortic valve regurgitation is not visualized. Aortic valve sclerosis/calcification is present, without any evidence of aortic stenosis. Pulmonic Valve: The pulmonic valve was not well visualized. Pulmonic valve regurgitation is not visualized. No evidence of pulmonic stenosis. Aorta: Aortic root could not be assessed. Venous: The inferior vena cava is normal in size with greater than 50% respiratory variability, suggesting right atrial pressure of 3 mmHg. IAS/Shunts: No atrial level shunt detected by color flow Doppler. Agitated saline contrast was given intravenously to evaluate for intracardiac shunting. Agitated saline contrast bubble study was negative, with no evidence of any interatrial shunt. Additional Comments: Spectral Doppler performed. Color Doppler performed.  TRICUSPID VALVE TR Peak grad:   51.3 mmHg TR Vmax:        358.00 cm/s Lavona Mound Tobb DO Electronically signed by Thomasene Ripple DO Signature  Date/Time: 02/10/2023/12:47:16 PM    Final    DG Chest Port 1 View Result Date: 02/09/2023 CLINICAL DATA:  Respiratory failure. EXAM: PORTABLE CHEST 1 VIEW COMPARISON:  02/03/2023 FINDINGS: Low volume film. The cardio pericardial silhouette is enlarged. Diffuse interstitial opacity again noted, progressive in the parahilar left mid lung. No substantial pleural effusion. No acute bony abnormality. Telemetry leads overlie the chest. IMPRESSION: Low volume film with progressive interstitial opacity in the parahilar left mid lung. Imaging features could be related to asymmetric edema or infection. Electronically Signed   By: Kennith Center M.D.   On: 02/09/2023 08:54   DG Chest Portable 1 View Result Date: 02/03/2023 CLINICAL DATA:  Shortness of breath. Hypoxia. History of Wegener's and interstitial lung disease. EXAM: PORTABLE CHEST 1 VIEW COMPARISON:  Radiographs 01/05/2023 and 01/01/2023.  CT 01/07/2023. FINDINGS: 1252 hours. The heart size and mediastinal contours are stable status post median sternotomy and CABG. Chronic interstitial lung disease again noted with possible slight worsening on the right which could indicate superimposed edema or inflammation. There is no consolidation, pneumothorax or significant pleural effusion. The bones appear unchanged. IMPRESSION: Chronic interstitial lung disease with possible slight worsening on the right which could indicate superimposed edema or inflammation. Electronically Signed   By: Carey Bullocks M.D.   On: 02/03/2023 14:01   CT Chest High Resolution Result Date: 01/07/2023 CLINICAL DATA:  Inpatient. Diffuse/interstitial lung disease ild flare EXAM: CT CHEST WITHOUT CONTRAST TECHNIQUE: Multidetector CT imaging of the chest was performed following the standard protocol without intravenous contrast. High resolution imaging of the lungs, as well as inspiratory and expiratory imaging, was performed. RADIATION DOSE REDUCTION: This exam was performed according to the  departmental dose-optimization program which includes automated exposure control, adjustment of the mA and/or kV according to patient size and/or  use of iterative reconstruction technique. COMPARISON:  01/05/2023 chest radiograph. 11/05/2022 high-resolution chest CT. FINDINGS: Cardiovascular: Stable mild cardiomegaly. No significant pericardial effusion/thickening. Three-vessel coronary atherosclerosis status post CABG. Atherosclerotic nonaneurysmal thoracic aorta. Normal caliber pulmonary arteries. Mediastinum/Nodes: No significant thyroid nodules. Unremarkable esophagus. No axillary adenopathy. Mild right paratracheal adenopathy up to 1.2 cm (series 5/image 45), unchanged. No new pathologically enlarged mediastinal nodes. No discrete hilar adenopathy on these noncontrast images. Lungs/Pleura: No pneumothorax. No pleural effusion. No acute consolidative airspace disease, lung masses or significant pulmonary nodules. Stable mild patchy air trapping in both lungs on the expiration sequence, without evidence of tracheobronchomalacia. Extensive patchy peribronchovascular and subpleural reticulation and ground-glass opacity throughout both lungs with associated mild traction bronchiectasis and architectural distortion. No clear apicobasilar gradient to these findings. Scattered foci of mild honeycombing in both lungs, for example in the peripheral right lung on series 8/image 80 and in the peripheral lower left lung on series 8/image 98). Ground-glass opacities have worsened since 11/05/2022 chest CT. Findings are otherwise not appreciably changed. Upper abdomen: Cholelithiasis. Musculoskeletal: No aggressive appearing focal osseous lesions. Intact sternotomy wires. Moderate thoracic spondylosis. IMPRESSION: 1. Spectrum of findings compatible with fibrotic interstitial lung disease with mild honeycombing, with worsened diffuse patchy ground-glass opacity in the interval since 11/05/2022 CT. Acute flare of chronic  fibrotic interstitial lung disease favored. As previously described, chronic hypersensitivity pneumonitis or fibrotic NSIP are favored. Findings are suggestive of an alternative diagnosis (not UIP) per consensus guidelines: Diagnosis of Idiopathic Pulmonary Fibrosis: An Official ATS/ERS/JRS/ALAT Clinical Practice Guideline. Am Rosezetta Schlatter Crit Care Med Vol 198, Iss 5, [phone], Sep 04 2016. 2. Stable mild cardiomegaly. 3. Cholelithiasis. 4.  Aortic Atherosclerosis (ICD10-I70.0). Electronically Signed   By: Delbert Phenix M.D.   On: 01/07/2023 10:28   CARDIAC CATHETERIZATION Result Date: 01/06/2023 1. Normal filling pressures. 2. Mild pulmonary hypertension without elevated PVR. 3. Normal cardiac output.   VAS [place] LOWER EXTREMITY VENOUS (DVT) Result Date: 01/05/2023  Lower Venous DVT Study Patient Name:  BASSEL GASKILL [facility]  Date of Exam:   01/05/2023 Medical Rec #: [id number]          Accession #:    [id number] Date of Birth: 07-Apr-1945           Patient Gender: M Patient Age:   72 years Exam Location:  [facility] Procedure:      VAS [place] LOWER EXTREMITY VENOUS (DVT) Referring Phys: Levon Hedger --------------------------------------------------------------------------------  Indications: Edema.  Anticoagulation: Coumadin. Limitations: Poor ultrasound/tissue interface. Performing Technologist: Ernestene Mention RVT, RDMS  Examination Guidelines: A complete evaluation includes B-mode imaging, spectral Doppler, color Doppler, and power Doppler as needed of all accessible portions of each vessel. Bilateral testing is considered an integral part of a complete examination. Limited examinations for reoccurring indications may be performed as noted. The reflux portion of the exam is performed with the patient in reverse Trendelenburg.  +--------+---------------+---------+-----------+----------+--------------------+ RIGHT   CompressibilityPhasicitySpontaneityPropertiesThrombus Aging        +--------+---------------+---------+-----------+----------+--------------------+ CFV     Full           Yes      Yes                                       +--------+---------------+---------+-----------+----------+--------------------+ SFJ     Full                                                              +--------+---------------+---------+-----------+----------+--------------------+  FV Prox Full           Yes      Yes                                       +--------+---------------+---------+-----------+----------+--------------------+ FV Mid  Full           Yes      Yes                                       +--------+---------------+---------+-----------+----------+--------------------+ FV                     Yes      Yes                  patent by            Distal                                               color/doppler        +--------+---------------+---------+-----------+----------+--------------------+ PFV     Full                                                              +--------+---------------+---------+-----------+----------+--------------------+ POP     Full           Yes      Yes                                       +--------+---------------+---------+-----------+----------+--------------------+ PTV     Full                                                              +--------+---------------+---------+-----------+----------+--------------------+ PERO    Full                                                              +--------+---------------+---------+-----------+----------+--------------------+   +--------+---------------+---------+-----------+----------+--------------------+ LEFT    CompressibilityPhasicitySpontaneityPropertiesThrombus Aging       +--------+---------------+---------+-----------+----------+--------------------+ CFV     Full           Yes      Yes                                        +--------+---------------+---------+-----------+----------+--------------------+ SFJ     Full                                                              +--------+---------------+---------+-----------+----------+--------------------+  FV Prox Full           Yes      Yes                                       +--------+---------------+---------+-----------+----------+--------------------+ FV Mid  Full           Yes      Yes                                       +--------+---------------+---------+-----------+----------+--------------------+ FV                     Yes      Yes                  patent by            Distal                                               color/doppler        +--------+---------------+---------+-----------+----------+--------------------+ PFV     Full                                                              +--------+---------------+---------+-----------+----------+--------------------+ POP     Full           Yes      Yes                                       +--------+---------------+---------+-----------+----------+--------------------+ PTV     Full                                                              +--------+---------------+---------+-----------+----------+--------------------+ PERO    Full                                                              +--------+---------------+---------+-----------+----------+--------------------+     Summary: BILATERAL: - No evidence of deep vein thrombosis seen in the lower extremities, bilaterally. -No evidence of popliteal cyst, bilaterally.   *See table(s) above for measurements and observations. Electronically signed by Lemar Livings MD on 01/05/2023 at 5:34:57 PM.    Final    DG Chest Port 1 View Result Date: 01/05/2023 CLINICAL DATA:  Acute respiratory failure. History of hypertension and coronary artery disease. EXAM: PORTABLE CHEST 1 VIEW COMPARISON:  01/01/2023  FINDINGS: Previous median sternotomy and CABG procedure. Lung volumes are low. Diffuse increased reticular and nodular interstitial opacities within both  lungs compatible with previously characterized chronic fibrotic no superimposed pleural effusion, interstitial edema or airspace consolidation. Interstitial lung disease IMPRESSION: 1. No acute findings. 2. Chronic interstitial lung disease. Electronically Signed   By: Signa Kell M.D.   On: 01/05/2023 12:24   DG Chest Port 1 View Result Date: 01/01/2023 CLINICAL DATA:  Shortness of breath. EXAM: PORTABLE CHEST 1 VIEW COMPARISON:  09/17/2022. FINDINGS: Low lung volume. There are diffuse increased interstitial markings, less pronounced than the prior radiograph from 10/04/2022, which corresponds to chronic interstitial lung disease on recent chest CT scan from 11/05/2022. Bilateral lung fields are otherwise clear. No acute consolidation or lung collapse. Bilateral costophrenic angles are clear. Stable cardio-mediastinal silhouette. Sternotomy wires noted, status post CABG. No acute osseous abnormalities. The soft tissues are within normal limits. IMPRESSION: *No acute cardiopulmonary abnormality.  Interstitial lung disease. Electronically Signed   By: Jules Schick M.D.   On: 01/01/2023 17:19   ECHOCARDIOGRAM COMPLETE Result Date: 12/07/2022    ECHOCARDIOGRAM REPORT   Patient Name:   MICKY SHELLER Date of Exam: 12/07/2022 Medical Rec #:  [id number]         Height:       68.3 in Accession #:    [id number]        Weight:       221.0 lb Date of Birth:  1945-03-31          BSA:          2.139 m Patient Age:    77 years          BP:           128/80 mmHg Patient Gender: M                 HR:           81 bpm. Exam Location:  [address] Procedure: 2D Echo, 3D Echo, Cardiac Doppler and Color Doppler Indications:    R06.00 Dyspnea  History:        Patient has prior history of Echocardiogram examinations, most                 recent 03/02/2022. CHF, CAD, Prior  CABG, Arrythmias:Atrial                 Fibrillation, Signs/Symptoms:Dyspnea; Risk Factors:Hypertension,                 Dyslipidemia, Former Smoker, Family History of Coronary Artery                 Disease and Sleep Apnea. Interstitial Lung Disease, Wegener's                 Granulomatosis.  Sonographer:    Farrel Conners RDCS Referring Phys: Kalman Shan IMPRESSIONS  1. Left ventricular ejection fraction, by estimation, is 60 to 65%. The left ventricle has normal function. The left ventricle has no regional wall motion abnormalities. Left ventricular diastolic parameters are consistent with Grade I diastolic dysfunction (impaired relaxation).  2. Mid systolic notching of the pulmonary artery pressure tracing consistent with pulmonary hypertension. RVSP estimated at . . Right ventricular systolic function is normal. The right ventricular size is mildly enlarged.  3. Left atrial size was moderately dilated.  4. Right atrial size was severely dilated.  5. The mitral valve is normal in structure. Trivial mitral valve regurgitation. No evidence of mitral stenosis. Moderate mitral annular calcification.  6. The aortic valve is normal in structure. Aortic valve regurgitation is not  visualized. No aortic stenosis is present.  7. Aortic dilatation noted. There is mild dilatation of the ascending aorta, measuring 41 mm.  8. The inferior vena cava is normal in size with greater than 50% respiratory variability, suggesting right atrial pressure of 3 mmHg. FINDINGS  Left Ventricle: Left ventricular ejection fraction, by estimation, is 60 to 65%. The left ventricle has normal function. The left ventricle has no regional wall motion abnormalities. The left ventricular internal cavity size was normal in size. There is  no left ventricular hypertrophy. Left ventricular diastolic parameters are consistent with Grade I diastolic dysfunction (impaired relaxation). Right Ventricle: Mid systolic notching of the pulmonary  artery pressure tracing consistent with pulmonary hypertension. RVSP estimated at . The right ventricular size is mildly enlarged. No increase in right ventricular wall thickness. Right ventricular systolic function is normal. Left Atrium: Left atrial size was moderately dilated. Right Atrium: Right atrial size was severely dilated. Pericardium: There is no evidence of pericardial effusion. Mitral Valve: The mitral valve is normal in structure. Moderate mitral annular calcification. Trivial mitral valve regurgitation. No evidence of mitral valve stenosis. MV peak gradient, 5.7 mmHg. The mean mitral valve gradient is 2.0 mmHg. Tricuspid Valve: The tricuspid valve is normal in structure. Tricuspid valve regurgitation is not demonstrated. No evidence of tricuspid stenosis. Aortic Valve: The aortic valve is normal in structure. Aortic valve regurgitation is not visualized. No aortic stenosis is present. Aortic valve mean gradient measures 7.0 mmHg. Aortic valve peak gradient measures 13.0 mmHg. Aortic valve area, by VTI measures 2.51 cm. Pulmonic Valve: The pulmonic valve was normal in structure. Pulmonic valve regurgitation is not visualized. No evidence of pulmonic stenosis. Aorta: Aortic dilatation noted and the aortic root is normal in size and structure. There is mild dilatation of the ascending aorta, measuring 41 mm. Venous: The inferior vena cava is normal in size with greater than 50% respiratory variability, suggesting right atrial pressure of 3 mmHg. IAS/Shunts: No atrial level shunt detected by color flow Doppler.  LEFT VENTRICLE PLAX 2D LVIDd:         5.40 cm   Diastology LVIDs:         3.60 cm   LV e' medial:    5.87 cm/s LV PW:         1.10 cm   LV E/e' medial:  16.0 LV IVS:        1.10 cm   LV e' lateral:   10.30 cm/s LVOT diam:     2.60 cm   LV E/e' lateral: 9.1 LV SV:         99 LV SV Index:   46 LVOT Area:     5.31 cm                           3D Volume EF:                          3D EF:         60 %                          LV EDV:       187 ml                          LV ESV:       75 ml  LV SV:        113 ml RIGHT VENTRICLE RV Basal diam:  4.50 cm RV Mid diam:    3.50 cm RV S prime:     16.10 cm/s TAPSE (M-mode): 2.3 cm RVSP:           59.2 mmHg LEFT ATRIUM              Index        RIGHT ATRIUM           Index LA diam:        5.30 cm  2.48 cm/m   RA Pressure: 3.00 mmHg LA Vol (A2C):   79.1 ml  36.99 ml/m  RA Area:     31.20 cm LA Vol (A4C):   112.0 ml 52.37 ml/m  RA Volume:   122.00 ml 57.05 ml/m LA Biplane Vol: 98.6 ml  46.11 ml/m  AORTIC VALVE AV Area (Vmax):    2.48 cm AV Area (Vmean):   2.44 cm AV Area (VTI):     2.51 cm AV Vmax:           180.00 cm/s AV Vmean:          121.000 cm/s AV VTI:            0.393 m AV Peak Grad:      13.0 mmHg AV Mean Grad:      7.0 mmHg LVOT Vmax:         84.10 cm/s LVOT Vmean:        55.550 cm/s LVOT VTI:          0.186 m LVOT/AV VTI ratio: 0.47  AORTA Ao Root diam: 3.40 cm Ao Asc diam:  4.05 cm MITRAL VALVE               TRICUSPID VALVE MV Area (PHT): cm         TR Peak grad:   56.2 mmHg MV Area VTI:   2.87 cm    TR Vmax:        375.00 cm/s MV Peak grad:  5.7 mmHg    Estimated RAP:  3.00 mmHg MV Mean grad:  2.0 mmHg    RVSP:           59.2 mmHg MV Vmax:       1.19 m/s MV Vmean:      55.7 cm/s   SHUNTS MV Decel Time: 219 msec    Systemic VTI:  0.19 m MV E velocity: 94.10 cm/s  Systemic Diam: 2.60 cm MV A velocity: 67.15 cm/s MV E/A ratio:  1.40 Clearnce Hasten Electronically signed by Clearnce Hasten Signature Date/Time: 12/07/2022/4:32:10 PM    Final       Signature  -   Susa Raring M.D on 02/16/2023 at 10:56 AM   -  To page go to [email or web address]

## 2023-02-17 DIAGNOSIS — J9621 Acute and chronic respiratory failure with hypoxia: Secondary | ICD-10-CM | POA: Diagnosis not present

## 2023-02-17 LAB — CBC WITH DIFFERENTIAL/PLATELET
Abs Immature Granulocytes: 0.13 10*3/uL — ABNORMAL HIGH (ref 0.00–0.07)
Basophils Absolute: 0 10*3/uL (ref 0.0–0.1)
Basophils Relative: 0 %
Eosinophils Absolute: 0 10*3/uL (ref 0.0–0.5)
Eosinophils Relative: 0 %
HCT: 27.5 % — ABNORMAL LOW (ref 39.0–52.0)
Hemoglobin: 8 g/dL — ABNORMAL LOW (ref 13.0–17.0)
Immature Granulocytes: 2 %
Lymphocytes Relative: 4 %
Lymphs Abs: 0.4 10*3/uL — ABNORMAL LOW (ref 0.7–4.0)
MCH: 26.3 pg (ref 26.0–34.0)
MCHC: 29.1 g/dL — ABNORMAL LOW (ref 30.0–36.0)
MCV: 90.5 fL (ref 80.0–100.0)
Monocytes Absolute: 0.4 10*3/uL (ref 0.1–1.0)
Monocytes Relative: 4 %
Neutro Abs: 7.7 10*3/uL (ref 1.7–7.7)
Neutrophils Relative %: 90 %
Platelets: 394 10*3/uL (ref 150–400)
RBC: 3.04 MIL/uL — ABNORMAL LOW (ref 4.22–5.81)
RDW: 24 % — ABNORMAL HIGH (ref 11.5–15.5)
Smear Review: NORMAL
WBC: 8.5 10*3/uL (ref 4.0–10.5)
nRBC: 0.7 % — ABNORMAL HIGH (ref 0.0–0.2)

## 2023-02-17 LAB — GLUCOSE, CAPILLARY
Glucose-Capillary: 79 mg/dL (ref 70–99)
Glucose-Capillary: 247 mg/dL — ABNORMAL HIGH (ref 70–99)
Glucose-Capillary: 160 mg/dL — ABNORMAL HIGH (ref 70–99)
Glucose-Capillary: 140 mg/dL — ABNORMAL HIGH (ref 70–99)

## 2023-02-17 LAB — BASIC METABOLIC PANEL
Anion gap: 12 (ref 5–15)
BUN: 23 mg/dL (ref 8–23)
CO2: 31 mmol/L (ref 22–32)
Calcium: 9.4 mg/dL (ref 8.9–10.3)
Chloride: 97 mmol/L — ABNORMAL LOW (ref 98–111)
Creatinine, Ser: 0.87 mg/dL (ref 0.61–1.24)
GFR, Estimated: >60 mL/min (ref >60)
Glucose, Bld: 139 mg/dL — ABNORMAL HIGH (ref 70–99)
Potassium: 4.6 mmol/L (ref 3.5–5.1)
Sodium: 140 mmol/L (ref 135–145)

## 2023-02-17 LAB — PROCALCITONIN: Procalcitonin: 0.19 ng/mL

## 2023-02-17 MED ORDER — FUROSEMIDE 10 MG/ML IJ SOLN
40.0000 mg | Freq: Once | INTRAMUSCULAR | Status: AC
Start: 2023-02-17 — End: 2023-02-17
  Administered 2023-02-17: 40 mg via INTRAVENOUS
  Filled 2023-02-17: qty 4

## 2023-02-17 MED ORDER — GUAIFENESIN-DM 100-10 MG/5ML PO SYRP
5.0000 mL | ORAL_SOLUTION | Freq: Once | ORAL | Status: AC
  Administered 2023-02-17: 5 mL via ORAL
  Filled 2023-02-17: qty 5

## 2023-02-17 NOTE — Progress Notes (Signed)
NAME:  Gary [name], MRN:  [id number], DOB:  November 23, 1945, LOS: 14 ADMISSION DATE:  02/03/2023, CONSULTATION DATE:  02/03/2023 REFERRING MD:  Dr. Susa Raring, CHIEF COMPLAINT:  dyspnea, hypoxia   History of Present Illness:  This 78 year old Caucasian male is seen in consultation at the request of Dr. Susa Raring for recommendations on further evaluation and management of hypoxia.  The patient was sent to [facility] emergency department from [facility] pulmonary clinic today, after he presented with reports of having profound hypoxia at home (down to SpO2 45%).  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  He is on rituximab and steroids.  In fact, he was recently hospitalized for interstitial lung disease flareup and has been on a prednisone taper, currently on prednisone 40 mg p.o. daily.  Treated with high-dose steroids.  Right heart cath on 01/04/2023 showed normal filling pressures, mild pulmonary hypertension and preserved cardiac output.  He reported doing well after his recent hospitalization and felt that he had a very good day yesterday.  However, this morning he woke up and experienced extreme exertional dyspnea while getting from his bed to the bathroom, when he noted that his SpO2 went down to 45%.  He is normally on supplemental oxygen at 2-2.5 LPM via nasal cannula.  He does titrate as needed to maintain SpO2 90%.  However, he noted that despite turning his supplemental oxygen up to 6 LPM (which he typically needs during ambulation), he was not recovering.  He denies cough.  He denies pleuritic pain.  He denies sputum production.  He denies chest pain.  He does state that when he was profoundly hypoxic he did notice that he was experiencing blurring of vision.  He has noted some modest increased swelling in his lower extremities.  He does confess that he is prescribed daily furosemide but is inconsistent with adherence to  prescribed therapy.  Patient says he has had a very stressful and very active week, which revolves around having to get to multiple medical appointments.  He had an iron infusion a few days ago.  He also had a Rituxan infusion earlier this week.  Patient says he has been having blood in his stools, although he says that this is not any more than usual.  This has been a chronic problem ever since his diagnosis of Wegener's granulomatosis.    He is on Coumadin for chronic atrial fibrillation.  His last INR check (01/24/2023) was supratherapeutic, 6.7.  However, upon recheck in the emergency department, this has fallen into the therapeutic range, 2.7.  Last hemoglobin reported by patient was 8.2, currently 8.7.   Pertinent  Medical History  PAST MEDICAL history significant for Wegener's granulomatosis (diagnosed 2019, on Rituxan and steroids), type 2 diabetes mellitus, OSA on CPAP, atrial fibrillation on coumadin, HFpEF (LVEF 65 in 12/28/2022, grade 1 diastolic dysfunction), pulmonary hypertension, coronary artery disease.  Significant Hospital Events: Including procedures, antibiotic start and stop dates in addition to other pertinent events   1/30 presented with shortness of breath and hypoxia to steroids started, solumedrol 125mg  q12 1/31 states he feels well but is requiring 8 to 10 L high flow nasal cannula 2/1 Started Cellcept 500mg  bid 2/2 no acute issues overnight, remains on 6 L high flow 2/7 Steroids reduced to solumedrol 125 mg daily 2/9 Prednisone 30mg  IV bid 2/10 Cellcept increased to 1000mg  bid 2/12 1 Dose cytoxan given 750mg /m2  Interim History / Subjective:   Remains on  5 to 8 L oxygen.  Still has desats on exertion Sitting OOB in chair, no c/o.   Objective   Blood pressure (!) 107/54, pulse 81, temperature (!) 97.4 F (36.3 C), temperature source Oral, resp. rate 17, height 5\' 10"  (1.778 m), weight 91.3 kg, SpO2 97%.        Intake/Output Summary (Last 24 hours) at  02/17/2023 1130 Last data filed at 02/17/2023 1000 Gross per 24 hour  Intake --  Output 1350 ml  Net -1350 ml   Filed Weights   02/15/23 0500 02/16/23 0543 02/17/23 0900  Weight: 88.9 kg 89.3 kg 91.3 kg    Examination: Gen:      Chronically ill appearing male, NAD in chair, easily SOB with exertion  HEENT:  EOMI, sclera anicteric Neck:     No masses; no thyromegaly Lungs:    resps even non labored at rest, Bilateral rhonchi CV:         Regular rate and rhythm; no murmurs Abd:      + bowel sounds; soft, non-tender; no palpable masses, no distension Ext:    No edema; adequate peripheral perfusion Skin:      Warm and dry; no rash Neuro: alert and oriented x 3 Psych: normal mood and affect    Resolved Hospital Problem list   Not applicable.  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  I/O now net NEG Neg 10L.  Bubble study neg. EF good   Plan He is s/p high dose steroids 125 mg bid x 7 days and reduced to 125 mg daily on 2/7, and to prednisone 30 twice daily on 2/9 On CellCept 1 g twice daily Discussed with [name] his primary pulmonologist.  Since there is no improvement we are giving Cytoxan infusion today Cont to wean O2 as able Encourage PO intake Cont lasix as BP/BUN and cr allow Cont dapsone for PCP prophylaxis  May need to consider palliative eval if no improvement  Discussed in  detail with patient and family at bedside. Hopeful for d/c in next few days.  Would continue prednisone 60mg  daily for 5 days, then 50mg  daily for 5 days, then 40mg  for 5 days then 30mg  and hold until seen by pulm in office  Outpt pulm f/u with Dr Marchelle [name] scheduled -- 03/10/23 3pm   Signature:  Dirk Dress, NP Pulmonary/Critical Care Medicine  02/17/2023  11:30 AM   See Loretha Stapler for personal pager PCCM on call pager [phone] until 7pm. Please call Elink 7p-7a. [phone]

## 2023-02-17 NOTE — Plan of Care (Signed)
  Problem: Fluid Volume: Goal: Ability to maintain a balanced intake and output will improve Outcome: Progressing   Problem: Tissue Perfusion: Goal: Adequacy of tissue perfusion will improve Outcome: Progressing   Problem: Education: Goal: Knowledge of General Education information will improve Description: Including pain rating scale, medication(s)/side effects and non-pharmacologic comfort measures Outcome: Progressing   Problem: Clinical Measurements: Goal: Will remain free from infection Outcome: Progressing Goal: Diagnostic test results will improve Outcome: Progressing Goal: Respiratory complications will improve Outcome: Progressing Goal: Cardiovascular complication will be avoided Outcome: Progressing   Problem: Activity: Goal: Risk for activity intolerance will decrease Outcome: Progressing   Problem: Coping: Goal: Level of anxiety will decrease Outcome: Progressing   Problem: Skin Integrity: Goal: Risk for impaired skin integrity will decrease Outcome: Progressing

## 2023-02-17 NOTE — Consult Note (Signed)
Cardiology Consultation   Patient ID: Gary [name]: [id number]; DOB: 11/02/1945  Admit date: 02/03/2023 Date of Consult: 02/17/2023  PCP:  Charlane Ferretti, DO   [facility] Providers Cardiologist:  Chilton Si, MD   Patient Profile:   Gary [name] is a 78 y.o. male with a hx of HTN, DM, GERD, CAD (remote PCIs, CABG > PCI 2023 to SVG> PDA), chronic CHF (mixed diastolic/systolic), HLD, OSA, Wegener's, AFib who is being seen 02/17/2023 for the evaluation of Tikosyn management at the request of Thedore Mins.  Tikosyn started June 2024  History of Present Illness:   Gary [name] was admitted 02/03/23 with acute worsening of SOB > hopxic at home with sats to the 40's >> self managed with increased n/c O2 >> eventually to the ER > admitted with acute on chronic hypoxic respiratory failure   Significant studies: 1/30>> CXR: Worsening right-sided infiltrates. 2/06>> Echo:EF 60-65%, agitated saline contrast bubble study negative-normal evidence of interatrial shunt.   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 He is on rituximab and steroids recently on prednisone taper Acute on chronic hypoxic respiratory failure secondary to GPA associated ILD (on home O2-anywhere from 3-4 L) History of granulomatosis with polyangiitis (GPA) 1/30 presented with shortness of breath and hypoxia to steroids started, solumedrol 125mg  q12 1/31 states he feels well but is requiring 8 to 10 L high flow nasal cannula 2/1 Started Cellcept 500mg  bid 2/2 no acute issues overnight, remains on 6 L high flow 2/7 Steroids reduced to solumedrol 125 mg daily 2/9 Prednisone 30mg  IV bid 2/10 Cellcept increased to 1000mg  bid 2/12 1 Dose cytoxan given 750mg /m2 With no improvement we are giving Cytoxan infusion today   might need to consider  palliative  Also noted anemic > GI plans are for outpatient endoscopy evaluation once his respiratory status improves-No overt bleeding noted-some drop in Hb but no obvious evidence of GI bleed. >> Iron  EP is asked on board with concerns of QT lengthening on telemetry  LABS Today K+ 4.6 Mag yesterday was 2.3 BUN/Creat 23/0.87 WBC 8.5 H/H 8/27 Plts 394  Meds reviewed --- ? If Tussonex is OK --- HE GOT A DOSE OF ZOFRAN 01/16/23 X1, NONE FURTHER ORDERED --- PHENERGAN ORDERED, NONE GIVEN AT LEAST IN THE LAST 4 DAYS >> I have discontinued   --- [facility] is an out patient med  I have asked pharmacy to consult and review meds   The pt denies CP, reports since on Tikosyn he had has GI issues, diarrhea seems.   Listed as a possible side effect. 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   Arthritis    Atrial fibrillation (HCC) 11/13/2014   Cancer (HCC)    skin   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/2016   LVEF 45-50% 11/2014   Coronary artery disease    Depression    Essential hypertension 11/13/2014   Hyperlipidemia 11/13/2014   ILD (interstitial lung disease) (HCC)    Sleep apnea    CPAP 'Uses half the time"  last study 2009   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    Past Surgical History:  Procedure Laterality Date   CARDIOVERSION N/A 12/06/2014   Procedure: CARDIOVERSION;  Surgeon: Chilton Si, MD;  Location: [facility] ENDOSCOPY;  Service: Cardiovascular;  Laterality: N/A;   CARDIOVERSION N/A 05/26/2017   Procedure: CARDIOVERSION;  Surgeon: Chilton Si, MD;  Location: [facility] ENDOSCOPY;  Service: Cardiovascular;  Laterality: N/A;   CARDIOVERSION N/A 04/25/2019   Procedure: CARDIOVERSION;  Surgeon: Thurmon Fair, MD;  Location: [facility] ENDOSCOPY;  Service: Cardiovascular;  Laterality: N/A;    CARDIOVERSION N/A 02/26/2022   Procedure: CARDIOVERSION;  Surgeon: Chilton Si, MD;  Location: [facility] ENDOSCOPY;  Service: Cardiovascular;  Laterality: N/A;   CARDIOVERSION N/A 05/13/2022   Procedure: CARDIOVERSION;  Surgeon: Quintella Reichert, MD;  Location: [facility] INVASIVE CV LAB;  Service: Cardiovascular;  Laterality: N/A;   CARPAL TUNNEL RELEASE     CORONARY ANGIOPLASTY     multiple stents   CORONARY ARTERY BYPASS GRAFT  2006   CORONARY STENT INTERVENTION N/A 09/28/2021   Procedure: CORONARY STENT INTERVENTION;  Surgeon: Runell Gess, MD;  Location: [facility] INVASIVE CV LAB;  Service: Cardiovascular;  Laterality: N/A;  SVG-PDA   EYE SURGERY     right eye-  muscular repair   LEFT HEART CATH AND CORS/GRAFTS ANGIOGRAPHY N/A 09/28/2021   Procedure: LEFT HEART CATH AND CORS/GRAFTS ANGIOGRAPHY;  Surgeon: Runell Gess, MD;  Location: [facility] INVASIVE CV LAB;  Service: Cardiovascular;  Laterality: N/A;   RIGHT HEART CATH N/A 01/04/2023   Procedure: RIGHT HEART CATH;  Surgeon: Laurey Morale, MD;  Location: [facility] INVASIVE CV LAB;  Service: Cardiovascular;  Laterality: N/A;   TOTAL HIP ARTHROPLASTY  01/03/2012   Procedure: TOTAL HIP ARTHROPLASTY;  Surgeon: Jacki Cones, MD;  Location: [facility] ORS;  Service: Orthopedics;  Laterality: Right;     Home Medications:  Prior to Admission medications   Medication Sig Start Date End Date Taking? Authorizing Provider  ALPRAZolam Prudy Feeler) 0.5 MG tablet Take 0.5 mg by mouth at bedtime. Takes at 11:00pm   Yes [provider]  aspirin 81 MG chewable tablet Chew 81 mg by mouth daily.   Yes [provider]  Calcium Carb-Cholecalciferol (CALCIUM 600/VITAMIN D3 PO) Take 1 tablet by mouth daily.   Yes [provider]  carvedilol (COREG) 25 MG tablet Take 0.5 tablets (12.5 mg total) by mouth 2 (two) times daily. Patient taking differently: Take 12.5 mg by mouth 2 (two) times daily. Hold dose if SBP <110. 03/06/22  Yes Zannie Cove, MD   Cholecalciferol (VITAMIN D-3) 125 MCG (5000 UT) TABS Take 10,000 Units by mouth daily.   Yes [provider]  clopidogrel (PLAVIX) 75 MG tablet Take 75 mg by mouth daily. 01/26/23  Yes [provider]  Coenzyme Q10 (COQ10 PO) Take 400 mg by mouth daily.   Yes [provider]  dapsone 100 MG tablet Take 1 tablet (100 mg total) by mouth daily. 01/07/23  Yes Hunsucker, Lesia Sago, MD  dofetilide (TIKOSYN) 500 MCG capsule TAKE 1 CAPSULE BY MOUTH 2 TIMES DAILY. 11/02/22  Yes Eustace Pen, PA-C  Evolocumab (REPATHA SURECLICK) 140 MG/ML SOAJ INJECT 140 MG INTO THE SKIN EVERY 14 (FOURTEEN) DAYS. INJECT 1 PEN INTO THE FATTY TISSUE SKIN EVERY 14 DAY 05/06/22  Yes Chilton Si, MD  ferrous sulfate 325 (65 FE) MG EC tablet Take 325 mg by mouth daily with breakfast.   Yes [provider]  furosemide (LASIX) 40 MG tablet Take 1 tablet (40 mg total) by mouth daily. 01/07/23  Yes Zannie Cove, MD  levothyroxine (SYNTHROID, LEVOTHROID) 137 MCG tablet Take 137 mcg by mouth daily before breakfast. 10/27/14  Yes [provider]  loratadine (CLARITIN) 10 MG tablet Take  10 mg by mouth daily.   Yes [provider]  metFORMIN (GLUCOPHAGE) 500 MG tablet Take 500 mg by mouth 2 (two) times daily. 10/27/17  Yes [provider]  Multiple Vitamins-Minerals (CENTRUM SILVER 50+MEN) TABS Take 1 tablet by mouth daily.   Yes [provider]  nitroGLYCERIN (NITROSTAT) 0.4 MG SL tablet Place 1 tablet (0.4 mg total) under the tongue every 5 (five) minutes x 3 doses as needed for chest pain. 09/29/21  Yes Jonita Albee, PA-C  pantoprazole (PROTONIX) 40 MG tablet TAKE 1 TABLET BY MOUTH 2 (TWO) TIMES DAILY FOR 7 DAYS, THEN 1 TABLET (40 MG TOTAL) DAILY. 01/13/23  Yes Alver Sorrow, NP  Pirfenidone 267 MG TABS Take 1 tab three times daily for 7 days, then 2 tabs three times daily for 7 days, then 3 tabs three times daily thereafter. 01/18/23  Yes Kalman Shan, MD  potassium chloride SA (KLOR-CON M) 20 MEQ tablet Take 1 tablet (20 mEq total) by mouth daily. 01/07/23  Yes Zannie Cove, MD  predniSONE (DELTASONE) 20 MG tablet Take 60 mg (3 tabs) daily for 2 weeks, cut down to 40 mg (2 tabs) daily from 1/17 01/07/23  Yes Zannie Cove, MD  riTUXimab (RITUXAN IV) Inject 1 application  into the vein See admin instructions. Take every two weeks and every six months alternating   Yes [provider]  Semaglutide (OZEMPIC, 0.25 OR 0.5 MG/DOSE, [place]) Inject 0.5 mg into the skin once a week.   Yes [provider]  testosterone cypionate (DEPOTESTOSTERONE CYPIONATE) 200 MG/ML injection Inject 150 mg into the muscle every 14 (fourteen) days.   Yes [provider]  venlafaxine XR (EFFEXOR-XR) 150 MG 24 hr capsule Take 150 mg by mouth daily.   Yes [provider]  warfarin (COUMADIN) 5 MG tablet TAKE 1/2 TABLET TO 1 TABLET BY MOUTH DAILY AS DIRECTED BY COUMADIN CLINIC Patient taking differently: Take 2.5-5 mg by mouth See admin instructions. TAKE 1/2 TABLET TO 1 TABLET BY MOUTH DAILY AS DIRECTED BY COUMADIN CLINIC; 2.5 mg Mon Wed Fri; 5 mg all other days 11/11/22  Yes Chilton Si, MD    Inpatient Medications: Scheduled Meds:  ALPRAZolam  0.5 mg Oral QHS   apixaban  5 mg Oral BID   aspirin  81 mg Oral Daily   carvedilol  3.125 mg Oral BID WC   dapsone  100 mg Oral Daily   dofetilide  500 mcg Oral BID   dronabinol  5 mg Oral QAC lunch   feeding supplement (GLUCERNA SHAKE)  237 mL Oral BID BM   insulin aspart  0-20 Units Subcutaneous TID WC   insulin aspart  0-5 Units Subcutaneous QHS   insulin aspart  4 Units Subcutaneous TID WC   insulin glargine-yfgn  12 Units Subcutaneous Daily   levothyroxine  137 mcg Oral Q0600   mycophenolate  1,000 mg Oral BID   pantoprazole  40 mg Oral BID   predniSONE  30 mg Oral BID WC   venlafaxine XR  150 mg Oral Daily   Continuous Infusions:  PRN Meds: acetaminophen **OR**  acetaminophen, bisacodyl, chlorpheniramine-HYDROcodone, metoprolol tartrate, nitroGLYCERIN, mouth rinse, promethazine  Allergies:    Allergies  Allergen Reactions   Farxiga [Dapagliflozin] Other (See Comments)    Lightheadedness Low blood pressure   Statins Other (See Comments)    Myalgias     Social History:   Social History   Socioeconomic History   Marital status: Married    Spouse name:  Not on file   Number of children: Not on file   Years of education: Not on file   Highest education level: Not on file  Occupational History   Not on file  Tobacco Use   Smoking status: Former    Current packs/day: 0.00    Types: Cigarettes    Quit date: 12/24/1983    Years since quitting: 39.1   Smokeless tobacco: Never  Vaping Use   Vaping status: Never Used  Substance and Sexual Activity   Alcohol use: No    Alcohol/week: 0.0 standard drinks of alcohol   Drug use: No   Sexual activity: Not on file  Other Topics Concern   Not on file  Social History Narrative   Not on file   Social Drivers of Health   Financial Resource Strain: Not on file  Food Insecurity: No Food Insecurity (02/04/2023)   Hunger Vital Sign    Worried About Running Out of Food in the Last Year: Never true    Ran Out of Food in the Last Year: Never true  Transportation Needs: No Transportation Needs (02/04/2023)   PRAPARE - Administrator, Civil Service (Medical): No    Lack of Transportation (Non-Medical): No  Physical Activity: Not on file  Stress: Not on file  Social Connections: Moderately Isolated (02/04/2023)   Social Connection and Isolation Panel [NHANES]    Frequency of Communication with Friends and Family: More than three times a week    Frequency of Social Gatherings with Friends and Family: Three times a week    Attends Religious Services: Never    Active Member of Clubs or Organizations: No    Attends Banker Meetings: Never    Marital Status: Married  Careers information officer Violence: Not At Risk (02/04/2023)   Humiliation, Afraid, Rape, and Kick questionnaire    Fear of Current or Ex-Partner: No    Emotionally Abused: No    Physically Abused: No    Sexually Abused: No    Family History:   Family History  Problem Relation Age of Onset   Heart attack Mother    Other Father        odd stomach disorder   Stroke Maternal Grandmother    Heart attack Maternal Grandfather    Heart attack Paternal Grandmother    Heart attack Paternal Grandfather      ROS:  Please see the history of present illness.  All other ROS reviewed and negative.     Physical Exam/Data:   Vitals:   02/17/23 0817 02/17/23 0828 02/17/23 0900 02/17/23 1211  BP: (!) 109/53 (!) 107/54  96/81  Pulse: 75 81  90  Resp: 17     Temp: (!) 97.4 F (36.3 C)   97.9 F (36.6 C)  TempSrc: Oral   Oral  SpO2: 97%   91%  Weight:   91.3 kg   Height:        Intake/Output Summary (Last 24 hours) at 02/17/2023 1324 Last data filed at 02/17/2023 1200 Gross per 24 hour  Intake --  Output 1700 ml  Net -1700 ml      02/17/2023    9:00 AM 02/17/2023    3:27 AM 02/16/2023    5:43 AM  Last 3 Weights  Weight (lbs) 201 lb 3.2 oz -- 196 lb 12.8 oz  Weight (kg) 91.264 kg -- 89.268 kg     Body mass index is 28.87 kg/m.  General:  Well nourished, well developed, in  no acute distress HEENT: normal Neck: no JVD Vascular: No carotid bruits; Distal pulses 2+ bilaterally Cardiac: RRR; no murmurs, gallops or rubs Lungs:  b/l rhonchi Abd: soft, nontender Ext: no edema Musculoskeletal:  No deformities Skin: warm and dry  Neuro:  no focal abnormalities noted Psych:  Normal affect   EKG:  The EKG was personally reviewed and demonstrates:    #1 SR 70bpm, QTc #2 SR 71bpm, QTc Today  SR 61bpm, QTc  SR 82bpm, QTc  Telemetry:  Telemetry was personally reviewed and demonstrates:   SR, PACs, infrequent PVCs, rare couplet  Relevant CV Studies:  02/10/23: TTE 1. Left  ventricular ejection fraction, by estimation, is 60 to 65%. The  left ventricle has normal function. The left ventricle has no regional  wall motion abnormalities. There is moderate concentric left ventricular  hypertrophy. Left ventricular  diastolic function could not be evaluated.   2. Right ventricular systolic function is normal. The right ventricular  size is normal. There is moderately elevated pulmonary artery systolic  pressure.   3. The mitral valve is degenerative. Mild mitral valve regurgitation. No  evidence of mitral stenosis. Moderate mitral annular calcification.   4. The aortic valve is calcified. There is mild calcification of the  aortic valve. Aortic valve regurgitation is not visualized. Aortic valve  sclerosis/calcification is present, without any evidence of aortic  stenosis.   5. The inferior vena cava is normal in size with greater than 50%  respiratory variability, suggesting right atrial pressure of 3 mmHg.   6. Agitated saline contrast bubble study was negative, with no evidence  of any interatrial shunt.   Laboratory Data:  High Sensitivity Troponin:   Recent Labs  Lab 02/03/23 1304 02/03/23 1443 02/03/23 1911 02/03/23 2341  TROPONINIHS 31* 31* 37* 26*     Chemistry Recent Labs  Lab 02/14/23 0514 02/15/23 0501 02/15/23 1857 02/16/23 0456 02/17/23 0455  NA 142 140 137 137 140  K 4.2 4.5 3.9 3.8 4.6  CL 92* 99 97* 96* 97*  CO2 33* 29 29 30 31   GLUCOSE 96 143* 126* 101* 139*  BUN 29* 36* 34* 32* 23  CREATININE 0.80 0.98 0.83 0.78 0.87  CALCIUM 10.2 9.4 9.0 9.0 9.4  MG 2.4 2.3  --  2.3  --   GFRNONAA >60 >60 >60 >60 >60  ANIONGAP 17* 12 11 11 12     Recent Labs  Lab 02/15/23 1857  PROT 5.2*  ALBUMIN 2.6*  AST 33  ALT 59*  ALKPHOS 170*  BILITOT 0.5   Lipids No results for input(s): "CHOL", "TRIG", "HDL", "LABVLDL", "LDLCALC", "CHOLHDL" in the last 168 hours.  Hematology Recent Labs  Lab 02/15/23 1857 02/16/23 0456 02/17/23 0455   WBC 8.5 8.0 8.5  RBC 2.98* 3.18* 3.04*  HGB 8.0* 8.3* 8.0*  HCT 26.6* 28.4* 27.5*  MCV 89.3 89.3 90.5  MCH 26.8 26.1 26.3  MCHC 30.1 29.2* 29.1*  RDW 25.2* 24.7* 24.0*  PLT 335 367 394   Thyroid No results for input(s): "TSH", "FREET4" in the last 168 hours.  BNP Recent Labs  Lab 02/14/23 0514 02/15/23 0501 02/16/23 0456  BNP 155.0* 174.3* 106.4*    DDimer No results for input(s): "DDIMER" in the last 168 hours.   Radiology/Studies:  No results found.   Assessment and Plan:   Persistent AFib CHA2DS2Vasc is 6, on warfarin outpt >> transitioned to Eliquis here  Chronic Tikosyn  Electrolytes looke OK Calc CrCl using actual weight is 79  Tikosyn remains appropriate His labs/lytes/renal function have been stable  Recommend daily BMET/mag while here QTc has wobbled some, but is acceptable  NO MORE ZOFRAN, PHENERGAN or other QT prolonging agents I have consulted pharmacy to review meds as well. Effexor looks chronic/on out pt  He seems to be maintaining SR  Dr. Nelly Laurence has seen the patient     Risk Assessment/Risk Scores:   For questions or updates, please contact [facility] Please consult [email or web address] for contact info under    Signed, Sheilah Pigeon, PA-C  02/17/2023 1:24 PM;t

## 2023-02-17 NOTE — Telephone Encounter (Signed)
You saw this patient end of January (sent to ED), I just wanted to forward this to you as I never saw him

## 2023-02-17 NOTE — Progress Notes (Signed)
Physical Therapy Treatment Patient Details Name: Gary [name] MRN: [id number] DOB: [birth year] Today's Date: 02/17/2023   History of Present Illness Pt is 78 yo male admitted on 1/30/425 with acute on chronic resp failure with hypoxia.  Pt with hx including but not limited to Wegener's granulomatosis, ILD, former smoker, CAD/CABG, afib, CHF, DM2    PT Comments  Pt received in the recliner and agreeable to session. Pt continues to appear short of breath at rest, however is motivated to participate in session. Pt able to tolerate short gait trial in the room, however requires a seated rest break due to DOE and weakness. Pt then able to ambulate to the recliner and perform static standing marches, but quickly requires a seated rest due to fatigue. Pt requires increase up to 10L during session to maintain SpO2 WFL. Pt continues to benefit from PT services to progress toward functional mobility goals.    If plan is discharge home, recommend the following: Assistance with cooking/housework;Assist for transportation;Help with stairs or ramp for entrance   Can travel by private vehicle        Equipment Recommendations  None recommended by PT    Recommendations for Other Services       Precautions / Restrictions Precautions Precautions: Other (comment) Precaution/Restrictions Comments: watch sats, contact prec     Mobility  Bed Mobility Overal bed mobility: Modified Independent             General bed mobility comments: OOB in recliner upon arrival and departure    Transfers Overall transfer level: Needs assistance Equipment used: Rolling walker (2 wheels) Transfers: Sit to/from Stand Sit to Stand: Contact guard assist           General transfer comment: cues for hand placement    Ambulation/Gait Ambulation/Gait assistance: Contact guard assist Gait Distance (Feet): 20 Feet (+5) Assistive device: Rolling walker (2 wheels) Gait Pattern/deviations: Step-through  pattern, Trunk flexed, Decreased stride length       General Gait Details: slow gait with assist for line management. Limited by DOE requiring a seated rest break      Balance Overall balance assessment: Mild deficits observed, not formally tested                                          Communication Communication Communication: No apparent difficulties  Cognition Arousal: Alert Behavior During Therapy: WFL for tasks assessed/performed   PT - Cognitive impairments: No apparent impairments                                Cueing    Exercises General Exercises - Lower Extremity Hip Flexion/Marching: AROM, Both, 10 reps, Standing    General Comments General comments (skin integrity, edema, etc.): Pt on 7L upon entry with SpO2 at 92%. Pt desats to 82% on 8L during ambulation requiring increase to 10L and a seated rest break to improve >90%.      Pertinent Vitals/Pain Pain Assessment Pain Assessment: No/denies pain     PT Goals (current goals can now be found in the care plan section) Acute Rehab PT Goals Patient Stated Goal: home PT Goal Formulation: With patient Time For Goal Achievement: 02/18/23 Progress towards PT goals: Progressing toward goals    Frequency    Min 1X/week       AM-PAC PT "6  Clicks" Mobility   Outcome Measure  Help needed turning from your back to your side while in a flat bed without using bedrails?: None Help needed moving from lying on your back to sitting on the side of a flat bed without using bedrails?: None Help needed moving to and from a bed to a chair (including a wheelchair)?: A Little Help needed standing up from a chair using your arms (e.g., wheelchair or bedside chair)?: A Little Help needed to walk in hospital room?: A Little Help needed climbing 3-5 steps with a railing? : A Little 6 Click Score: 20    End of Session Equipment Utilized During Treatment: Oxygen Activity Tolerance: Patient  limited by fatigue;Other (comment) Patient left: with call bell/phone within reach;in chair Nurse Communication: Mobility status PT Visit Diagnosis: Difficulty in walking, not elsewhere classified (R26.2)     Time: 1610-9604 PT Time Calculation (min) (ACUTE ONLY): 24 min  Charges:    $Gait Training: 8-22 mins $Therapeutic Activity: 8-22 mins PT General Charges $$ ACUTE PT VISIT: 1 Visit                     Johny Shock, PTA Acute Rehabilitation Services Secure Chat Preferred  [phone]    Johny Shock 02/17/2023, 12:25 PM

## 2023-02-17 NOTE — Plan of Care (Signed)
  Problem: Education: Goal: Individualized Educational Video(s) Outcome: Progressing   Problem: Coping: Goal: Ability to adjust to condition or change in health will improve Outcome: Progressing   Problem: Tissue Perfusion: Goal: Adequacy of tissue perfusion will improve Outcome: Progressing   Problem: Education: Goal: Knowledge of General Education information will improve Description: Including pain rating scale, medication(s)/side effects and non-pharmacologic comfort measures Outcome: Progressing   Problem: Clinical Measurements: Goal: Will remain free from infection Outcome: Progressing Goal: Diagnostic test results will improve Outcome: Progressing   Problem: Activity: Goal: Risk for activity intolerance will decrease Outcome: Progressing

## 2023-02-17 NOTE — Plan of Care (Signed)

## 2023-02-17 NOTE — Progress Notes (Signed)
PROGRESS NOTE        PATIENT DETAILS Name: Gary [name] Age: 78 y.o. Sex: male Date of Birth: 1945-07-01 Admit Date: 02/03/2023 Admitting Physician Leroy Sea, MD ZOX:WRUEAV, [name], DO  Brief Summary: Patient is a 78 y.o.  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 2/06>> Echo:EF 60-65%, agitated saline contrast bubble study negative-normal evidence of interatrial shunt.  Significant microbiology data: 1/30>> respiratory virus panel: Negative 1/30>> COVID/influenza/RSV PCR: Negative  Procedures: None  Consults: PCCM  Subjective:  Patient in bed, appears comfortable, denies any headache, no fever, no chest pain or pressure, now improving shortness of breath , no abdominal pain. No new focal weakness.   Objective: Vitals: Blood pressure (!) 107/54, pulse 81, temperature 98.7 F (37.1 C), temperature source Oral, resp. rate 14, height 5\' 10"  (1.778 m), weight 89.3 kg, SpO2 95%.   Exam:  Awake Alert, No new F.N deficits, Normal affect [place].AT,PERRAL Supple Neck, No JVD,   Symmetrical Chest wall movement, Good air movement bilaterally, chronic bibasilar fine crackles RRR,No Gallops, Rubs or new Murmurs,  +ve B.Sounds, Abd Soft, No tenderness,   No Cyanosis, Clubbing or edema     Assessment/Plan:  Acute on chronic hypoxic respiratory failure secondary to GPA associated ILD (on home O2-anywhere from 3-4 L) Slowly improving-Down to 4-6 L of oxygen-at one point several days back-he was on  12 L of HFNC . Although hypoxia slowly improving-he continues to have severe exertional dyspnea with minimal activity with significant drops in saturation-with slow but gradual recovery with rest.  He seems a bit more fatigued/tired today. Remains on CellCept ( dose doubled on 02/14/23) was on Solu-Medrol currently on prednisone total 60 a day per PCCM continue this dose for now, possible Cytoxan infusion in a day or 2 by PCCM.  Case discussed with Dr. Isaiah Serge PCCM on 02/15/2023. As needed Lasix, repeat another dose on 02/17/2023 Echo without cardiac shunt-stable EF 60%. Continue attempts to slowly titrate down FiO2. PCCM continues to follow, he is finally starting to improve in the last 2 to 3 days, if improvement continues likely discharge on 02/19/2023 or 02/20/2023.  SpO2: 95 % O2 Flow Rate (L/min): 5 L/min FiO2 (%): 40 %    History of granulomatosis with polyangiitis (GPA) Was on tapering steroids (down to 40 mg)-when he developed worsening respiratory failure Prophylactic dapsone for PJP (no Bactrim as interaction with Tikosyn) PCCM started CellCept-no longer on Rituxan Currently on IV Solu-Medrol-dose reduced from twice daily to daily on 2/7.  Paroxysmal atrial fibrillation Maintaining sinus rhythm Tikosyn/Coreg Has been transitioned to Eliquis-no plans to restart Coumadin on discharge.  Chronic HFpEF Euvolemic-on Lasix to ensure negative balance.  History of CAD s/p PCI September 2023 Aspirin No longer on Plavix-this was also not on his most recent discharge list of medications.  Iron deficiency anemia Appreciate GI input-plans are for outpatient endoscopy evaluation once his respiratory status improves-No overt bleeding noted-some drop in Hb but no obvious evidence of GI bleed.  Given IV iron in 2/4-follow CBC  Hypothyroidism Synthroid  Mood disorder Stable Continue Effexor   DM-2 with steroid related hyperglycemia CBGs continue to fluctuate-elevated yesterday-borderline  hypoglycemic today Decrease Semglee to 10 units Continue SSI Follow-up with   Recent Labs    02/16/23 1654 02/16/23 2209 02/17/23 0813  GLUCAP 98 288* 79     BMI: Estimated body mass index is 28.24 kg/m as calculated from the following:   Height as of this encounter: 5\' 10"  (1.778 m).   Weight as of this encounter: 89.3 kg.   Code status:   Code Status: Full Code   DVT Prophylaxis: SCDs Start: 02/03/23 1523 apixaban (ELIQUIS) tablet 5 mg    Family Communication:Spouse [name]-[phone] on 2/4   Disposition Plan: Status is: Inpatient Remains inpatient appropriate because: Severity of illness   Planned Discharge Destination:Home health   Diet: Diet Order             Diet regular Fluid consistency: Thin  Diet effective now                     Antimicrobial agents: Anti-infectives (From admission, onward)    Start     Dose/Rate Route Frequency Ordered Stop   02/03/23 1445  dapsone tablet 100 mg        100 mg Oral Daily 02/03/23 1442          MEDICATIONS: Scheduled Meds:  ALPRAZolam  0.5 mg Oral QHS   apixaban  5 mg Oral BID   aspirin  81 mg Oral Daily   carvedilol  3.125 mg Oral BID WC   dapsone  100 mg Oral Daily   dofetilide  500 mcg Oral BID   dronabinol  5 mg Oral QAC lunch   feeding supplement (GLUCERNA SHAKE)  237 mL Oral BID BM   furosemide  40 mg Intravenous Once   insulin aspart  0-20 Units Subcutaneous TID WC   insulin aspart  0-5 Units Subcutaneous QHS   insulin aspart  4 Units Subcutaneous TID WC   insulin glargine-yfgn  12 Units Subcutaneous Daily   levothyroxine  137 mcg Oral Q0600   mycophenolate  1,000 mg Oral BID   pantoprazole  40 mg Oral BID   predniSONE  30 mg Oral BID WC   venlafaxine XR  150 mg Oral Daily   Continuous Infusions:    PRN Meds:.acetaminophen **OR** acetaminophen, bisacodyl, chlorpheniramine-HYDROcodone, metoprolol tartrate, nitroGLYCERIN, mouth rinse, promethazine   I have personally reviewed  following labs and imaging studies  LABORATORY DATA:   Data Review:    Recent Labs  Lab 02/13/23 0544 02/14/23 0514 02/15/23 0501 02/15/23 1857 02/16/23 0456 02/17/23 0455  WBC 9.9 8.9 8.8 8.5 8.0 8.5  HGB 8.6* 8.8* 8.7* 8.0* 8.3* 8.0*  HCT 28.9* 29.9* 30.2* 26.6* 28.4* 27.5*  PLT 314 310 395 335 367 394  MCV 89.5 89.8 89.3 89.3 89.3 90.5  MCH 26.6 26.4 25.7* 26.8 26.1 26.3  MCHC 29.8* 29.4* 28.8* 30.1 29.2* 29.1*  RDW 25.8* 25.6* 25.2* 25.2* 24.7* 24.0*  LYMPHSABS 0.7 0.4* 0.6*  --  0.4* 0.4*  MONOABS 0.3 0.2 0.4  --  0.3 0.4  EOSABS 0.0 0.0 0.0  --  0.0 0.0  BASOSABS 0.0 0.0 0.0  --  0.0 0.0    Recent Labs  Lab 02/12/23 1133 02/13/23 0544 02/13/23 0748 02/14/23 0514 02/15/23 0501 02/15/23 1857 02/16/23 0456 02/17/23 0455  NA  --  141  --  142 140 137 137 140  K  --  4.3  --  4.2 4.5 3.9 3.8 4.6  CL  --  99  --  92* 99 97* 96* 97*  CO2  --  31  --  33* 29 29 30 31   ANIONGAP  --  11  --  17* 12 11 11 12   GLUCOSE  --  151*  --  96 143* 126* 101* 139*  BUN  --  38*  --  29* 36* 34* 32* 23  CREATININE  --  0.86  --  0.80 0.98 0.83 0.78 0.87  AST  --   --   --   --   --  33  --   --   ALT  --   --   --   --   --  59*  --   --   ALKPHOS  --   --   --   --   --  170*  --   --   BILITOT  --   --   --   --   --  0.5  --   --   ALBUMIN  --   --   --   --   --  2.6*  --   --   CRP  --  11.1*  --  12.7* 15.8*  --  12.0*  --   PROCALCITON 0.50 0.54  --  0.36 0.46  --  0.20 0.19  BNP 183.8*  --  226.7* 155.0* 174.3*  --  106.4*  --   MG  --  2.3  --  2.4 2.3  --  2.3  --   PHOS  --  2.6  --  3.2 3.3  --  3.6  --   CALCIUM  --  9.6  --  10.2 9.4 9.0 9.0 9.4      Recent Labs  Lab 02/12/23 1133 02/13/23 0544 02/13/23 0748 02/14/23 0514 02/15/23 0501 02/15/23 1857 02/16/23 0456 02/17/23 0455  CRP  --  11.1*  --  12.7* 15.8*  --  12.0*  --   PROCALCITON 0.50 0.54  --  0.36 0.46  --  0.20 0.19  BNP 183.8*  --  226.7* 155.0* 174.3*  --  106.4*  --   MG  --  2.3   --  2.4 2.3  --  2.3  --   CALCIUM  --  9.6  --  10.2 9.4 9.0 9.0 9.4    --------------------------------------------------------------------------------------------------------------- Lab Results  Component Value Date   CHOL 155 05/26/2020   HDL 45 05/26/2020   LDLCALC 79 05/26/2020   TRIG 184 (H) 05/26/2020   CHOLHDL 3.4 05/26/2020    Lab Results  Component Value Date   HGBA1C 5.9 (H) 01/02/2023   Radiology Reports DG Chest Port 1 View Result Date: 02/12/2023 CLINICAL DATA:  Interstitial lung disease EXAM: PORTABLE CHEST 1 VIEW COMPARISON:  02/09/2023 FINDINGS: Single frontal view of the chest demonstrates postsurgical changes from CABG. Cardiac silhouette is enlarged but stable. Diffuse interstitial lung disease is again identified, with bilateral fibrotic changes and scattered ground-glass opacities most pronounced in the right upper and left lower lung zones similar to prior CT exam. No new consolidation, effusion, or pneumothorax. No acute bony abnormalities. IMPRESSION: 1. Stable findings of chronic interstitial lung disease, with fibrosis and ground-glass opacities unchanged since recent high-resolution chest CT 01/07/2023. 2. No evidence of acute intrathoracic process. Electronically Signed   By: Sharlet Salina M.D.   On: 02/12/2023 15:32   ECHOCARDIOGRAM LIMITED BUBBLE STUDY Result Date: 02/10/2023  ECHOCARDIOGRAM LIMITED REPORT   Patient Name:   ALOIS COLGAN Date of Exam: 02/10/2023 Medical Rec #:  [id number]         Height:       70.0 in Accession #:    [id number]        Weight:       201.7 lb Date of Birth:  Dec 13, 1945          BSA:          2.095 m Patient Age:    63 years          BP:           149/81 mmHg Patient Gender: M                 HR:           75 bpm. Exam Location:  Inpatient Procedure: Limited Echo, Cardiac Doppler, Color Doppler and Saline Contrast            Bubble Study Indications:    ASD  History:        Patient has prior history of Echocardiogram examinations,  most                 recent 12/07/2022. CAD; Risk Factors:Hypertension, Diabetes,                 Dyslipidemia, Former Smoker and Sleep Apnea.  Sonographer:    Karma Ganja Referring Phys: [id number] Vinnie Level SMITH IMPRESSIONS  1. Left ventricular ejection fraction, by estimation, is 60 to 65%. The left ventricle has normal function. The left ventricle has no regional wall motion abnormalities. There is moderate concentric left ventricular hypertrophy. Left ventricular diastolic function could not be evaluated.  2. Right ventricular systolic function is normal. The right ventricular size is normal. There is moderately elevated pulmonary artery systolic pressure.  3. The mitral valve is degenerative. Mild mitral valve regurgitation. No evidence of mitral stenosis. Moderate mitral annular calcification.  4. The aortic valve is calcified. There is mild calcification of the aortic valve. Aortic valve regurgitation is not visualized. Aortic valve sclerosis/calcification is present, without any evidence of aortic stenosis.  5. The inferior vena cava is normal in size with greater than 50% respiratory variability, suggesting right atrial pressure of 3 mmHg.  6. Agitated saline contrast bubble study was negative, with no evidence of any interatrial shunt. FINDINGS  Left Ventricle: Left ventricular ejection fraction, by estimation, is 60 to 65%. The left ventricle has normal function. The left ventricle has no regional wall motion abnormalities. The left ventricular internal cavity size was normal in size. There is  moderate concentric left ventricular hypertrophy. Left ventricular diastolic function could not be evaluated. Left ventricular diastolic function could not be evaluated due to mitral annular calcification (moderate or greater). Right Ventricle: The right ventricular size is normal. No increase in right ventricular wall thickness. Right ventricular systolic function is normal. There is moderately elevated pulmonary  artery systolic pressure. The tricuspid regurgitant velocity is 3.58 m/s, and with an assumed right atrial pressure of 3 mmHg, the estimated right ventricular systolic pressure is 54.3 mmHg. Left Atrium: Left atrial size was not assessed. Right Atrium: Right atrial size was not assessed. Pericardium: There is no evidence of pericardial effusion. Mitral Valve: The mitral valve is degenerative in appearance. Moderate mitral annular calcification. Mild mitral valve regurgitation. No evidence of mitral valve stenosis. Tricuspid Valve: The tricuspid valve is normal in structure. Tricuspid valve regurgitation is mild . No  evidence of tricuspid stenosis. Aortic Valve: The aortic valve is calcified. There is mild calcification of the aortic valve. Aortic valve regurgitation is not visualized. Aortic valve sclerosis/calcification is present, without any evidence of aortic stenosis. Pulmonic Valve: The pulmonic valve was not well visualized. Pulmonic valve regurgitation is not visualized. No evidence of pulmonic stenosis. Aorta: Aortic root could not be assessed. Venous: The inferior vena cava is normal in size with greater than 50% respiratory variability, suggesting right atrial pressure of 3 mmHg. IAS/Shunts: No atrial level shunt detected by color flow Doppler. Agitated saline contrast was given intravenously to evaluate for intracardiac shunting. Agitated saline contrast bubble study was negative, with no evidence of any interatrial shunt. Additional Comments: Spectral Doppler performed. Color Doppler performed.  TRICUSPID VALVE TR Peak grad:   51.3 mmHg TR Vmax:        358.00 cm/s Lavona Mound Tobb DO Electronically signed by Thomasene Ripple DO Signature Date/Time: 02/10/2023/12:47:16 PM    Final    DG Chest Port 1 View Result Date: 02/09/2023 CLINICAL DATA:  Respiratory failure. EXAM: PORTABLE CHEST 1 VIEW COMPARISON:  02/03/2023 FINDINGS: Low volume film. The cardio pericardial silhouette is enlarged. Diffuse interstitial  opacity again noted, progressive in the parahilar left mid lung. No substantial pleural effusion. No acute bony abnormality. Telemetry leads overlie the chest. IMPRESSION: Low volume film with progressive interstitial opacity in the parahilar left mid lung. Imaging features could be related to asymmetric edema or infection. Electronically Signed   By: Kennith Center M.D.   On: 02/09/2023 08:54   DG Chest Portable 1 View Result Date: 02/03/2023 CLINICAL DATA:  Shortness of breath. Hypoxia. History of Wegener's and interstitial lung disease. EXAM: PORTABLE CHEST 1 VIEW COMPARISON:  Radiographs 01/05/2023 and 01/01/2023.  CT 01/07/2023. FINDINGS: 1252 hours. The heart size and mediastinal contours are stable status post median sternotomy and CABG. Chronic interstitial lung disease again noted with possible slight worsening on the right which could indicate superimposed edema or inflammation. There is no consolidation, pneumothorax or significant pleural effusion. The bones appear unchanged. IMPRESSION: Chronic interstitial lung disease with possible slight worsening on the right which could indicate superimposed edema or inflammation. Electronically Signed   By: Carey Bullocks M.D.   On: 02/03/2023 14:01   CT Chest High Resolution Result Date: 01/07/2023 CLINICAL DATA:  Inpatient. Diffuse/interstitial lung disease ild flare EXAM: CT CHEST WITHOUT CONTRAST TECHNIQUE: Multidetector CT imaging of the chest was performed following the standard protocol without intravenous contrast. High resolution imaging of the lungs, as well as inspiratory and expiratory imaging, was performed. RADIATION DOSE REDUCTION: This exam was performed according to the departmental dose-optimization program which includes automated exposure control, adjustment of the mA and/or kV according to patient size and/or use of iterative reconstruction technique. COMPARISON:  01/05/2023 chest radiograph. 11/05/2022 high-resolution chest CT. FINDINGS:  Cardiovascular: Stable mild cardiomegaly. No significant pericardial effusion/thickening. Three-vessel coronary atherosclerosis status post CABG. Atherosclerotic nonaneurysmal thoracic aorta. Normal caliber pulmonary arteries. Mediastinum/Nodes: No significant thyroid nodules. Unremarkable esophagus. No axillary adenopathy. Mild right paratracheal adenopathy up to 1.2 cm (series 5/image 45), unchanged. No new pathologically enlarged mediastinal nodes. No discrete hilar adenopathy on these noncontrast images. Lungs/Pleura: No pneumothorax. No pleural effusion. No acute consolidative airspace disease, lung masses or significant pulmonary nodules. Stable mild patchy air trapping in both lungs on the expiration sequence, without evidence of tracheobronchomalacia. Extensive patchy peribronchovascular and subpleural reticulation and ground-glass opacity throughout both lungs with associated mild traction bronchiectasis and architectural distortion. No clear apicobasilar gradient to  these findings. Scattered foci of mild honeycombing in both lungs, for example in the peripheral right lung on series 8/image 80 and in the peripheral lower left lung on series 8/image 98). Ground-glass opacities have worsened since 11/05/2022 chest CT. Findings are otherwise not appreciably changed. Upper abdomen: Cholelithiasis. Musculoskeletal: No aggressive appearing focal osseous lesions. Intact sternotomy wires. Moderate thoracic spondylosis. IMPRESSION: 1. Spectrum of findings compatible with fibrotic interstitial lung disease with mild honeycombing, with worsened diffuse patchy ground-glass opacity in the interval since 11/05/2022 CT. Acute flare of chronic fibrotic interstitial lung disease favored. As previously described, chronic hypersensitivity pneumonitis or fibrotic NSIP are favored. Findings are suggestive of an alternative diagnosis (not UIP) per consensus guidelines: Diagnosis of Idiopathic Pulmonary Fibrosis: An Official  ATS/ERS/JRS/ALAT Clinical Practice Guideline. Am Rosezetta Schlatter Crit Care Med Vol 198, Iss 5, [phone], Sep 04 2016. 2. Stable mild cardiomegaly. 3. Cholelithiasis. 4.  Aortic Atherosclerosis (ICD10-I70.0). Electronically Signed   By: Delbert Phenix M.D.   On: 01/07/2023 10:28   CARDIAC CATHETERIZATION Result Date: 01/06/2023 1. Normal filling pressures. 2. Mild pulmonary hypertension without elevated PVR. 3. Normal cardiac output.   VAS [place] LOWER EXTREMITY VENOUS (DVT) Result Date: 01/05/2023  Lower Venous DVT Study Patient Name:  SIDDH VANDEVENTER [facility]  Date of Exam:   01/05/2023 Medical Rec #: [id number]          Accession #:    [id number] Date of Birth: March 12, 1945           Patient Gender: M Patient Age:   46 years Exam Location:  [facility] Procedure:      VAS [place] LOWER EXTREMITY VENOUS (DVT) Referring Phys: Levon Hedger --------------------------------------------------------------------------------  Indications: Edema.  Anticoagulation: Coumadin. Limitations: Poor ultrasound/tissue interface. Performing Technologist: Ernestene Mention RVT, RDMS  Examination Guidelines: A complete evaluation includes B-mode imaging, spectral Doppler, color Doppler, and power Doppler as needed of all accessible portions of each vessel. Bilateral testing is considered an integral part of a complete examination. Limited examinations for reoccurring indications may be performed as noted. The reflux portion of the exam is performed with the patient in reverse Trendelenburg.  +--------+---------------+---------+-----------+----------+--------------------+ RIGHT   CompressibilityPhasicitySpontaneityPropertiesThrombus Aging       +--------+---------------+---------+-----------+----------+--------------------+ CFV     Full           Yes      Yes                                       +--------+---------------+---------+-----------+----------+--------------------+ SFJ     Full                                                               +--------+---------------+---------+-----------+----------+--------------------+ FV Prox Full           Yes      Yes                                       +--------+---------------+---------+-----------+----------+--------------------+ FV Mid  Full           Yes      Yes                                       +--------+---------------+---------+-----------+----------+--------------------+  FV                     Yes      Yes                  patent by            Distal                                               color/doppler        +--------+---------------+---------+-----------+----------+--------------------+ PFV     Full                                                              +--------+---------------+---------+-----------+----------+--------------------+ POP     Full           Yes      Yes                                       +--------+---------------+---------+-----------+----------+--------------------+ PTV     Full                                                              +--------+---------------+---------+-----------+----------+--------------------+ PERO    Full                                                              +--------+---------------+---------+-----------+----------+--------------------+   +--------+---------------+---------+-----------+----------+--------------------+ LEFT    CompressibilityPhasicitySpontaneityPropertiesThrombus Aging       +--------+---------------+---------+-----------+----------+--------------------+ CFV     Full           Yes      Yes                                       +--------+---------------+---------+-----------+----------+--------------------+ SFJ     Full                                                              +--------+---------------+---------+-----------+----------+--------------------+ FV Prox Full           Yes      Yes                                        +--------+---------------+---------+-----------+----------+--------------------+ FV Mid  Full           Yes  Yes                                       +--------+---------------+---------+-----------+----------+--------------------+ FV                     Yes      Yes                  patent by            Distal                                               color/doppler        +--------+---------------+---------+-----------+----------+--------------------+ PFV     Full                                                              +--------+---------------+---------+-----------+----------+--------------------+ POP     Full           Yes      Yes                                       +--------+---------------+---------+-----------+----------+--------------------+ PTV     Full                                                              +--------+---------------+---------+-----------+----------+--------------------+ PERO    Full                                                              +--------+---------------+---------+-----------+----------+--------------------+     Summary: BILATERAL: - No evidence of deep vein thrombosis seen in the lower extremities, bilaterally. -No evidence of popliteal cyst, bilaterally.   *See table(s) above for measurements and observations. Electronically signed by Lemar Livings MD on 01/05/2023 at 5:34:57 PM.    Final    DG Chest Port 1 View Result Date: 01/05/2023 CLINICAL DATA:  Acute respiratory failure. History of hypertension and coronary artery disease. EXAM: PORTABLE CHEST 1 VIEW COMPARISON:  01/01/2023 FINDINGS: Previous median sternotomy and CABG procedure. Lung volumes are low. Diffuse increased reticular and nodular interstitial opacities within both lungs compatible with previously characterized chronic fibrotic no superimposed pleural effusion, interstitial edema or airspace consolidation. Interstitial lung disease  IMPRESSION: 1. No acute findings. 2. Chronic interstitial lung disease. Electronically Signed   By: Signa Kell M.D.   On: 01/05/2023 12:24   DG Chest Port 1 View Result Date: 01/01/2023 CLINICAL DATA:  Shortness of breath. EXAM: PORTABLE CHEST 1 VIEW COMPARISON:  09/17/2022. FINDINGS: Low lung volume. There are diffuse increased interstitial markings, less pronounced  than the prior radiograph from 10/04/2022, which corresponds to chronic interstitial lung disease on recent chest CT scan from 11/05/2022. Bilateral lung fields are otherwise clear. No acute consolidation or lung collapse. Bilateral costophrenic angles are clear. Stable cardio-mediastinal silhouette. Sternotomy wires noted, status post CABG. No acute osseous abnormalities. The soft tissues are within normal limits. IMPRESSION: *No acute cardiopulmonary abnormality.  Interstitial lung disease. Electronically Signed   By: Jules Schick M.D.   On: 01/01/2023 17:19   ECHOCARDIOGRAM COMPLETE Result Date: 12/07/2022    ECHOCARDIOGRAM REPORT   Patient Name:   TIONNE DAYHOFF Date of Exam: 12/07/2022 Medical Rec #:  [id number]         Height:       68.3 in Accession #:    [id number]        Weight:       221.0 lb Date of Birth:  June 14, 1945          BSA:          2.139 m Patient Age:    77 years          BP:           128/80 mmHg Patient Gender: M                 HR:           81 bpm. Exam Location:  [address] Procedure: 2D Echo, 3D Echo, Cardiac Doppler and Color Doppler Indications:    R06.00 Dyspnea  History:        Patient has prior history of Echocardiogram examinations, most                 recent 03/02/2022. CHF, CAD, Prior CABG, Arrythmias:Atrial                 Fibrillation, Signs/Symptoms:Dyspnea; Risk Factors:Hypertension,                 Dyslipidemia, Former Smoker, Family History of Coronary Artery                 Disease and Sleep Apnea. Interstitial Lung Disease, Wegener's                 Granulomatosis.  Sonographer:    Farrel Conners RDCS Referring Phys: Kalman Shan IMPRESSIONS  1. Left ventricular ejection fraction, by estimation, is 60 to 65%. The left ventricle has normal function. The left ventricle has no regional wall motion abnormalities. Left ventricular diastolic parameters are consistent with Grade I diastolic dysfunction (impaired relaxation).  2. Mid systolic notching of the pulmonary artery pressure tracing consistent with pulmonary hypertension. RVSP estimated at . . Right ventricular systolic function is normal. The right ventricular size is mildly enlarged.  3. Left atrial size was moderately dilated.  4. Right atrial size was severely dilated.  5. The mitral valve is normal in structure. Trivial mitral valve regurgitation. No evidence of mitral stenosis. Moderate mitral annular calcification.  6. The aortic valve is normal in structure. Aortic valve regurgitation is not visualized. No aortic stenosis is present.  7. Aortic dilatation noted. There is mild dilatation of the ascending aorta, measuring 41 mm.  8. The inferior vena cava is normal in size with greater than 50% respiratory variability, suggesting right atrial pressure of 3 mmHg. FINDINGS  Left Ventricle: Left ventricular ejection fraction, by estimation, is 60 to 65%. The left ventricle has normal function. The left ventricle has no regional wall motion abnormalities. The left ventricular internal cavity  size was normal in size. There is  no left ventricular hypertrophy. Left ventricular diastolic parameters are consistent with Grade I diastolic dysfunction (impaired relaxation). Right Ventricle: Mid systolic notching of the pulmonary artery pressure tracing consistent with pulmonary hypertension. RVSP estimated at . The right ventricular size is mildly enlarged. No increase in right ventricular wall thickness. Right ventricular systolic function is normal. Left Atrium: Left atrial size was moderately dilated. Right Atrium: Right atrial size  was severely dilated. Pericardium: There is no evidence of pericardial effusion. Mitral Valve: The mitral valve is normal in structure. Moderate mitral annular calcification. Trivial mitral valve regurgitation. No evidence of mitral valve stenosis. MV peak gradient, 5.7 mmHg. The mean mitral valve gradient is 2.0 mmHg. Tricuspid Valve: The tricuspid valve is normal in structure. Tricuspid valve regurgitation is not demonstrated. No evidence of tricuspid stenosis. Aortic Valve: The aortic valve is normal in structure. Aortic valve regurgitation is not visualized. No aortic stenosis is present. Aortic valve mean gradient measures 7.0 mmHg. Aortic valve peak gradient measures 13.0 mmHg. Aortic valve area, by VTI measures 2.51 cm. Pulmonic Valve: The pulmonic valve was normal in structure. Pulmonic valve regurgitation is not visualized. No evidence of pulmonic stenosis. Aorta: Aortic dilatation noted and the aortic root is normal in size and structure. There is mild dilatation of the ascending aorta, measuring 41 mm. Venous: The inferior vena cava is normal in size with greater than 50% respiratory variability, suggesting right atrial pressure of 3 mmHg. IAS/Shunts: No atrial level shunt detected by color flow Doppler.  LEFT VENTRICLE PLAX 2D LVIDd:         5.40 cm   Diastology LVIDs:         3.60 cm   LV e' medial:    5.87 cm/s LV PW:         1.10 cm   LV E/e' medial:  16.0 LV IVS:        1.10 cm   LV e' lateral:   10.30 cm/s LVOT diam:     2.60 cm   LV E/e' lateral: 9.1 LV SV:         99 LV SV Index:   46 LVOT Area:     5.31 cm                           3D Volume EF:                          3D EF:        60 %                          LV EDV:       187 ml                          LV ESV:       75 ml                          LV SV:        113 ml RIGHT VENTRICLE RV Basal diam:  4.50 cm RV Mid diam:    3.50 cm RV S prime:     16.10 cm/s TAPSE (M-mode): 2.3 cm RVSP:           59.2 mmHg LEFT ATRIUM  Index         RIGHT ATRIUM           Index LA diam:        5.30 cm  2.48 cm/m   RA Pressure: 3.00 mmHg LA Vol (A2C):   79.1 ml  36.99 ml/m  RA Area:     31.20 cm LA Vol (A4C):   112.0 ml 52.37 ml/m  RA Volume:   122.00 ml 57.05 ml/m LA Biplane Vol: 98.6 ml  46.11 ml/m  AORTIC VALVE AV Area (Vmax):    2.48 cm AV Area (Vmean):   2.44 cm AV Area (VTI):     2.51 cm AV Vmax:           180.00 cm/s AV Vmean:          121.000 cm/s AV VTI:            0.393 m AV Peak Grad:      13.0 mmHg AV Mean Grad:      7.0 mmHg LVOT Vmax:         84.10 cm/s LVOT Vmean:        55.550 cm/s LVOT VTI:          0.186 m LVOT/AV VTI ratio: 0.47  AORTA Ao Root diam: 3.40 cm Ao Asc diam:  4.05 cm MITRAL VALVE               TRICUSPID VALVE MV Area (PHT): cm         TR Peak grad:   56.2 mmHg MV Area VTI:   2.87 cm    TR Vmax:        375.00 cm/s MV Peak grad:  5.7 mmHg    Estimated RAP:  3.00 mmHg MV Mean grad:  2.0 mmHg    RVSP:           59.2 mmHg MV Vmax:       1.19 m/s MV Vmean:      55.7 cm/s   SHUNTS MV Decel Time: 219 msec    Systemic VTI:  0.19 m MV E velocity: 94.10 cm/s  Systemic Diam: 2.60 cm MV A velocity: 67.15 cm/s MV E/A ratio:  1.40 Clearnce Hasten Electronically signed by Clearnce Hasten Signature Date/Time: 12/07/2022/4:32:10 PM    Final       Signature  -   Susa Raring M.D on 02/17/2023 at 8:50 AM   -  To page go to [email or web address]

## 2023-02-18 ENCOUNTER — Inpatient Hospital Stay (HOSPITAL_COMMUNITY): Payer: Medicare Other

## 2023-02-18 DIAGNOSIS — J9621 Acute and chronic respiratory failure with hypoxia: Secondary | ICD-10-CM | POA: Diagnosis not present

## 2023-02-18 LAB — BASIC METABOLIC PANEL
Anion gap: 13 (ref 5–15)
BUN: 27 mg/dL — ABNORMAL HIGH (ref 8–23)
CO2: 28 mmol/L (ref 22–32)
Calcium: 8.9 mg/dL (ref 8.9–10.3)
Chloride: 98 mmol/L (ref 98–111)
Creatinine, Ser: 0.83 mg/dL (ref 0.61–1.24)
GFR, Estimated: >60 mL/min (ref >60)
Glucose, Bld: 67 mg/dL — ABNORMAL LOW (ref 70–99)
Potassium: 4.1 mmol/L (ref 3.5–5.1)
Sodium: 139 mmol/L (ref 135–145)

## 2023-02-18 LAB — CBC WITH DIFFERENTIAL/PLATELET
Abs Immature Granulocytes: 0.1 10*3/uL — ABNORMAL HIGH (ref 0.00–0.07)
Basophils Absolute: 0 10*3/uL (ref 0.0–0.1)
Basophils Relative: 0 %
Eosinophils Absolute: 0 10*3/uL (ref 0.0–0.5)
Eosinophils Relative: 0 %
HCT: 32.6 % — ABNORMAL LOW (ref 39.0–52.0)
Hemoglobin: 9.3 g/dL — ABNORMAL LOW (ref 13.0–17.0)
Immature Granulocytes: 1 %
Lymphocytes Relative: 3 %
Lymphs Abs: 0.4 10*3/uL — ABNORMAL LOW (ref 0.7–4.0)
MCH: 26.1 pg (ref 26.0–34.0)
MCHC: 28.5 g/dL — ABNORMAL LOW (ref 30.0–36.0)
MCV: 91.3 fL (ref 80.0–100.0)
Monocytes Absolute: 0.2 10*3/uL (ref 0.1–1.0)
Monocytes Relative: 1 %
Neutro Abs: 13.8 10*3/uL — ABNORMAL HIGH (ref 1.7–7.7)
Neutrophils Relative %: 95 %
Platelets: 555 10*3/uL — ABNORMAL HIGH (ref 150–400)
RBC: 3.57 MIL/uL — ABNORMAL LOW (ref 4.22–5.81)
RDW: 24.2 % — ABNORMAL HIGH (ref 11.5–15.5)
Smear Review: NORMAL
WBC: 14.6 10*3/uL — ABNORMAL HIGH (ref 4.0–10.5)
nRBC: 0 % (ref 0.0–0.2)

## 2023-02-18 LAB — GLUCOSE, CAPILLARY
Glucose-Capillary: 81 mg/dL (ref 70–99)
Glucose-Capillary: 211 mg/dL — ABNORMAL HIGH (ref 70–99)
Glucose-Capillary: 81 mg/dL (ref 70–99)
Glucose-Capillary: 258 mg/dL — ABNORMAL HIGH (ref 70–99)

## 2023-02-18 LAB — MAGNESIUM: Magnesium: 2.2 mg/dL (ref 1.7–2.4)

## 2023-02-18 MED ORDER — METHYLPREDNISOLONE SODIUM SUCC 125 MG IJ SOLR
60.0000 mg | INTRAMUSCULAR | Status: DC
  Administered 2023-02-18: 60 mg via INTRAVENOUS
  Filled 2023-02-18: qty 2

## 2023-02-18 MED ORDER — SODIUM CHLORIDE 0.9 % IV SOLN
1.0000 g | INTRAVENOUS | Status: DC
  Administered 2023-02-18 – 2023-02-20 (×3): 1 g via INTRAVENOUS
  Filled 2023-02-18 (×3): qty 10

## 2023-02-18 MED ORDER — SODIUM CHLORIDE 0.9 % IV SOLN
510.0000 mg | INTRAVENOUS | Status: DC
  Filled 2023-02-18: qty 17

## 2023-02-18 MED ORDER — FUROSEMIDE 10 MG/ML IJ SOLN: 40.0000 mg | Freq: Once | INTRAMUSCULAR | Status: DC

## 2023-02-18 MED ORDER — DOXYCYCLINE HYCLATE 100 MG PO TABS
100.0000 mg | ORAL_TABLET | Freq: Two times a day (BID) | ORAL | Status: AC
  Administered 2023-02-18 – 2023-02-20 (×5): 100 mg via ORAL
  Filled 2023-02-18 (×5): qty 1

## 2023-02-18 MED ORDER — METHYLPREDNISOLONE SODIUM SUCC 40 MG IJ SOLR
40.0000 mg | Freq: Four times a day (QID) | INTRAMUSCULAR | Status: DC
  Administered 2023-02-18 – 2023-02-20 (×7): 40 mg via INTRAVENOUS
  Filled 2023-02-18 (×7): qty 1

## 2023-02-18 MED ORDER — FUROSEMIDE 10 MG/ML IJ SOLN
60.0000 mg | Freq: Once | INTRAMUSCULAR | Status: AC
  Administered 2023-02-18: 60 mg via INTRAVENOUS
  Filled 2023-02-18: qty 6

## 2023-02-18 NOTE — Progress Notes (Signed)
OT Cancellation Note  Patient Details Name: Gary [name] MRN: [id number] DOB: 1945/08/27   Cancelled Treatment:    Reason Eval/Treat Not Completed: Patient declined, no reason specified (Attempted to work with pt, he stated "I don't want to do therapy. I am dying next week so does it matter anyways." Messaged MD of pt remarks, unsure if he is considering palliative. OT will follow-up with pt as tolerable, will await futher pt decisions.)  02/18/2023  AB, OTR/L  Acute Rehabilitation Services  Office: [phone]   Tristan Schroeder 02/18/2023, 4:20 PM

## 2023-02-18 NOTE — Progress Notes (Signed)
NAME:  Gary [name], MRN:  [id number], DOB:  07/11/1945, LOS: 15 ADMISSION DATE:  02/03/2023, CONSULTATION DATE:  02/03/2023 REFERRING MD:  Dr. Susa Raring, CHIEF COMPLAINT:  dyspnea, hypoxia   History of Present Illness:  This 78 year old Caucasian male is seen in consultation at the request of Dr. Susa Raring for recommendations on further evaluation and management of hypoxia.  The patient was sent to [facility] emergency department from [facility] pulmonary clinic today, after he presented with reports of having profound hypoxia at home (down to SpO2 45%).  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  He is on rituximab and steroids.  In fact, he was recently hospitalized for interstitial lung disease flareup and has been on a prednisone taper, currently on prednisone 40 mg p.o. daily.  Treated with high-dose steroids.  Right heart cath on 01/04/2023 showed normal filling pressures, mild pulmonary hypertension and preserved cardiac output.  He reported doing well after his recent hospitalization and felt that he had a very good day yesterday.  However, this morning he woke up and experienced extreme exertional dyspnea while getting from his bed to the bathroom, when he noted that his SpO2 went down to 45%.  He is normally on supplemental oxygen at 2-2.5 LPM via nasal cannula.  He does titrate as needed to maintain SpO2 90%.  However, he noted that despite turning his supplemental oxygen up to 6 LPM (which he typically needs during ambulation), he was not recovering.  He denies cough.  He denies pleuritic pain.  He denies sputum production.  He denies chest pain.  He does state that when he was profoundly hypoxic he did notice that he was experiencing blurring of vision.  He has noted some modest increased swelling in his lower extremities.  He does confess that he is prescribed daily furosemide but is inconsistent with adherence to  prescribed therapy.  Patient says he has had a very stressful and very active week, which revolves around having to get to multiple medical appointments.  He had an iron infusion a few days ago.  He also had a Rituxan infusion earlier this week.  Patient says he has been having blood in his stools, although he says that this is not any more than usual.  This has been a chronic problem ever since his diagnosis of Wegener's granulomatosis.    He is on Coumadin for chronic atrial fibrillation.  His last INR check (01/24/2023) was supratherapeutic, 6.7.  However, upon recheck in the emergency department, this has fallen into the therapeutic range, 2.7.  Last hemoglobin reported by patient was 8.2, currently 8.7.   Pertinent  Medical History  PAST MEDICAL history significant for Wegener's granulomatosis (diagnosed 2019, on Rituxan and steroids), type 2 diabetes mellitus, OSA on CPAP, atrial fibrillation on coumadin, HFpEF (LVEF 65 in 12/28/2022, grade 1 diastolic dysfunction), pulmonary hypertension, coronary artery disease.  Significant Hospital Events: Including procedures, antibiotic start and stop dates in addition to other pertinent events   1/30 presented with shortness of breath and hypoxia to steroids started, solumedrol 125mg  q12 1/31 states he feels well but is requiring 8 to 10 L high flow nasal cannula 2/1 Started Cellcept 500mg  bid 2/2 no acute issues overnight, remains on 6 L high flow 2/7 Steroids reduced to solumedrol 125 mg daily 2/9 Prednisone 30mg  IV bid 2/10 Cellcept increased to 1000mg  bid 2/12 1 Dose cytoxan given 750mg /m2 2/14 Oxygen requirement back up to 5L [place], patient  states he is considering hospice referral   Interim History / Subjective:  Seen sitting up in bedside recliner with flat affect. States he doesn't think he will get any better and is considereing hospice   Objective   Blood pressure (!) 119/45, pulse (!) 103, temperature 98.1 F (36.7 C), temperature source  Oral, resp. rate (!) 24, height 5\' 10"  (1.778 m), weight 91 kg, SpO2 93%.        Intake/Output Summary (Last 24 hours) at 02/18/2023 1140 Last data filed at 02/18/2023 0900 Gross per 24 hour  Intake 250 ml  Output 1250 ml  Net -1000 ml   Filed Weights   02/16/23 0543 02/17/23 0900 02/18/23 0533  Weight: 89.3 kg 91.3 kg 91 kg    Examination: General: Acute on chronically ill appearing elderly male lying in bed in NAD  HEENT: [place]/AT, MM pink/moist, PERRL,  Neuro: Alert and oriented x3, non-focal  CV: s1s2 regular rate and rhythm, no murmur, rubs, or gallops,  PULM:  Diminished bilaterally, no increased work of breathing, on 8L [place]  GI: soft, bowel sounds active in all 4 quadrants, non-tender, non-distended, tolerating oral diet  Extremities: warm/dry, no edema  Skin: no rashes or lesions  Resolved Hospital Problem list   Not applicable.  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  I/O now net NEG Neg 10L.  Bubble study neg. EF good -He is s/p high dose steroids 125 mg bid x 7 days and reduced to 125 mg daily on 2/7, and to PO prednisone 30 twice daily on 2/9 -Discussed with [name] his primary pulmonologist.  Since there is no improvement patient received Cytoxan 2/13 Plan Change steroids back IV  Continue CellCept  Continue supplemental oxygen Diurese as able   Patient is leaning toward  hospice referral   Signature:  Almalik Weissberg D. [name], NP-C [facility] Personal contact information can be found on Amion  If no contact or response made please call [phone] 02/18/2023, 11:41 AM

## 2023-02-18 NOTE — Progress Notes (Signed)
   02/18/23 2020  BiPAP/CPAP/SIPAP  Reason BIPAP/CPAP not in use Non-compliant (Patient states that he does not want to use CPAP machine.)  BiPAP/CPAP /SiPAP Vitals  Pulse Rate 76  Resp (!) 29  SpO2 94 %  MEWS Score/Color  MEWS Score 2  MEWS Score Color Yellow   Patient offered a different machine that will deliver more oxygen but he continues to refuse to wear CPAP.

## 2023-02-18 NOTE — Progress Notes (Signed)
PROGRESS NOTE        PATIENT DETAILS Name: Gary [name] Age: 78 y.o. Sex: male Date of Birth: 1945-01-13 Admit Date: 02/03/2023 Admitting Physician Leroy Sea, MD ZOX:WRUEAV, [name], DO  Brief Summary: Patient is a 78 y.o.  male with history of granulomatosis polyangiitis-ILD-chronic hypoxic respiratory failure on 3-4 L of oxygen at home, CAD s/p PCI September 2023, A-fib on warfarin, chronic HFpEF-recent hospitalization for ILD flare-on tapering steroids-presented with shortness of breath-found to have acute hypoxic respiratory failure-requiring HFNC-secondary to ILD flare (while on prednisone 40 mg daily)   Significant events: 1/30>> admit to [facility]  Significant studies: 1/30>> CXR: Worsening right-sided infiltrates. 2/06>> Echo:EF 60-65%, agitated saline contrast bubble study negative-normal evidence of interatrial shunt.  Significant microbiology data: 1/30>> respiratory virus panel: Negative 1/30>> COVID/influenza/RSV PCR: Negative  Procedures: None  Consults: PCCM  Subjective: Patient sitting in chair denies any headache or chest pain, shortness of breath is worse today, no focal weakness.   Objective: Vitals: Blood pressure (!) 119/45, pulse (!) 103, temperature 98.1 F (36.7 C), temperature source Oral, resp. rate (!) 24, height 5\' 10"  (1.778 m), weight 91 kg, SpO2 93%.   Exam:  Awake Alert, No new F.N deficits, Normal affect Lyndon.AT,PERRAL Supple Neck, No JVD,   Symmetrical Chest wall movement, Good air movement bilaterally, chronic bibasilar fine crackles RRR,No Gallops, Rubs or new Murmurs,  +ve B.Sounds, Abd Soft, No tenderness,   No Cyanosis, Clubbing or edema     Assessment/Plan:  Acute on chronic hypoxic respiratory failure secondary to GPA associated ILD (on home O2-anywhere from 3-4 L) Slowly improving-Down to 4-6 L of oxygen-at one point several days back-he was on 12 L of HFNC . Although hypoxia slowly improving-he  continues to have severe exertional dyspnea with minimal activity with significant drops in saturation-with slow but gradual recovery with rest.  He seems a bit more fatigued/tired today. Remains on CellCept ( dose doubled on 02/14/23) was on Solu-Medrol currently on prednisone total 60 a day per PCCM, received Cytoxan infusion on 02/16/2023, initially improved but now worse again on 02/18/2023.  Leukocyte count has gotten worse, repeat chest x-ray, repeat a course of Rocephin and doxycycline, IV Lasix on 02/18/2023.  Check inflammatory markers.   Will ask pulmonary to evaluate again to see if we have any other strategies for improvement versus thinking about comfort and palliative measures. Adding Lasix as needed repeating a dose on 02/18/2023 Echo without cardiac shunt-stable EF 60%. Continue attempts to slowly titrate down FiO2. PCCM continues to follow, unfortunately he has declined again on 02/18/2023  SpO2: 93 % O2 Flow Rate (L/min): 6 L/min FiO2 (%): 40 %    History of granulomatosis with polyangiitis (GPA) Was on tapering steroids (down to 40 mg)-when he developed worsening respiratory failure Prophylactic dapsone for PJP (no Bactrim as interaction with Tikosyn) PCCM started CellCept-no longer on Rituxan Currently on IV Solu-Medrol-dose reduced from twice daily to daily on 2/7.  Paroxysmal atrial fibrillation Maintaining sinus rhythm Tikosyn/Coreg Has been transitioned to Eliquis-no plans to restart Coumadin on discharge.  Chronic HFpEF Euvolemic-on Lasix to ensure negative balance.  History of CAD s/p PCI September 2023 Aspirin No longer on Plavix-this was also not on his most recent discharge list of medications.  Iron deficiency anemia Appreciate GI input-plans are for outpatient endoscopy evaluation once his respiratory status improves-No overt bleeding noted-some drop  in Hb but no obvious evidence of GI bleed.  Given IV iron in 2/4-follow  CBC  Hypothyroidism Synthroid  Mood disorder Stable Continue Effexor   DM-2 with steroid related hyperglycemia CBGs continue to fluctuate-elevated yesterday-borderline hypoglycemic today Decrease Semglee to 10 units Continue SSI Follow-up with   Recent Labs    02/17/23 1626 02/17/23 2122 02/18/23 0851  GLUCAP 160* 140* 81     BMI: Estimated body mass index is 28.79 kg/m as calculated from the following:   Height as of this encounter: 5\' 10"  (1.778 m).   Weight as of this encounter: 91 kg.   Code status:   Code Status: Full Code   DVT Prophylaxis: SCDs Start: 02/03/23 1523 apixaban (ELIQUIS) tablet 5 mg    Family Communication:Spouse [name]-[phone] on 02/18/2023   Disposition Plan: Status is: Inpatient Remains inpatient appropriate because: Severity of illness   Planned Discharge Destination:Home health   Diet: Diet Order             Diet regular Fluid consistency: Thin  Diet effective now                     Antimicrobial agents: Anti-infectives (From admission, onward)    Start     Dose/Rate Route Frequency Ordered Stop   02/18/23 1230  doxycycline (VIBRA-TABS) tablet 100 mg        100 mg Oral Every 12 hours 02/18/23 1133     02/18/23 1230  cefTRIAXone (ROCEPHIN) 1 g in sodium chloride 0.9 % 100 mL IVPB        1 g 200 mL/hr over 30 Minutes Intravenous Every 24 hours 02/18/23 1133 02/23/23 1229   02/03/23 1445  dapsone tablet 100 mg        100 mg Oral Daily 02/03/23 1442          MEDICATIONS: Scheduled Meds:  ALPRAZolam  0.5 mg Oral QHS   apixaban  5 mg Oral BID   aspirin  81 mg Oral Daily   carvedilol  3.125 mg Oral BID WC   dapsone  100 mg Oral Daily   dofetilide  500 mcg Oral BID   doxycycline  100 mg Oral Q12H   dronabinol  5 mg Oral QAC lunch   feeding supplement (GLUCERNA SHAKE)  237 mL Oral BID BM   furosemide  60 mg Intravenous Once   insulin aspart  0-20 Units Subcutaneous TID WC   insulin aspart  0-5 Units  Subcutaneous QHS   insulin aspart  4 Units Subcutaneous TID WC   insulin glargine-yfgn  12 Units Subcutaneous Daily   levothyroxine  137 mcg Oral Q0600   mycophenolate  1,000 mg Oral BID   pantoprazole  40 mg Oral BID   predniSONE  30 mg Oral BID WC   venlafaxine XR  150 mg Oral Daily   Continuous Infusions:  cefTRIAXone (ROCEPHIN)  IV       PRN Meds:.acetaminophen **OR** acetaminophen, bisacodyl, chlorpheniramine-HYDROcodone, metoprolol tartrate, nitroGLYCERIN, mouth rinse   I have personally reviewed following labs and imaging studies  LABORATORY DATA:   Data Review:    Recent Labs  Lab 02/14/23 0514 02/15/23 0501 02/15/23 1857 02/16/23 0456 02/17/23 0455 02/18/23 0842  WBC 8.9 8.8 8.5 8.0 8.5 14.6*  HGB 8.8* 8.7* 8.0* 8.3* 8.0* 9.3*  HCT 29.9* 30.2* 26.6* 28.4* 27.5* 32.6*  PLT 310 395 335 367 394 555*  MCV 89.8 89.3 89.3 89.3 90.5 91.3  MCH 26.4 25.7* 26.8 26.1 26.3 26.1  MCHC 29.4* 28.8* 30.1 29.2* 29.1* 28.5*  RDW 25.6* 25.2* 25.2* 24.7* 24.0* 24.2*  LYMPHSABS 0.4* 0.6*  --  0.4* 0.4* PENDING  MONOABS 0.2 0.4  --  0.3 0.4 PENDING  EOSABS 0.0 0.0  --  0.0 0.0 PENDING  BASOSABS 0.0 0.0  --  0.0 0.0 PENDING    Recent Labs  Lab 02/12/23 1133 02/13/23 0544 02/13/23 0748 02/14/23 0514 02/15/23 0501 02/15/23 1857 02/16/23 0456 02/17/23 0455 02/18/23 0559  NA  --  141  --  142 140 137 137 140 139  K  --  4.3  --  4.2 4.5 3.9 3.8 4.6 4.1  CL  --  99  --  92* 99 97* 96* 97* 98  CO2  --  31  --  33* 29 29 30 31 28   ANIONGAP  --  11  --  17* 12 11 11 12 13   GLUCOSE  --  151*  --  96 143* 126* 101* 139* 67*  BUN  --  38*  --  29* 36* 34* 32* 23 27*  CREATININE  --  0.86  --  0.80 0.98 0.83 0.78 0.87 0.83  AST  --   --   --   --   --  33  --   --   --   ALT  --   --   --   --   --  59*  --   --   --   ALKPHOS  --   --   --   --   --  170*  --   --   --   BILITOT  --   --   --   --   --  0.5  --   --   --   ALBUMIN  --   --   --   --   --  2.6*  --   --    --   CRP  --  11.1*  --  12.7* 15.8*  --  12.0*  --   --   PROCALCITON 0.50 0.54  --  0.36 0.46  --  0.20 0.19  --   BNP 183.8*  --  226.7* 155.0* 174.3*  --  106.4*  --   --   MG  --  2.3  --  2.4 2.3  --  2.3  --  2.2  PHOS  --  2.6  --  3.2 3.3  --  3.6  --   --   CALCIUM  --  9.6  --  10.2 9.4 9.0 9.0 9.4 8.9      Recent Labs  Lab 02/12/23 1133 02/13/23 0544 02/13/23 0748 02/14/23 0514 02/15/23 0501 02/15/23 1857 02/16/23 0456 02/17/23 0455 02/18/23 0559  CRP  --  11.1*  --  12.7* 15.8*  --  12.0*  --   --   PROCALCITON 0.50 0.54  --  0.36 0.46  --  0.20 0.19  --   BNP 183.8*  --  226.7* 155.0* 174.3*  --  106.4*  --   --   MG  --  2.3  --  2.4 2.3  --  2.3  --  2.2  CALCIUM  --  9.6  --  10.2 9.4 9.0 9.0 9.4 8.9    --------------------------------------------------------------------------------------------------------------- Lab Results  Component Value Date   CHOL 155 05/26/2020   HDL 45 05/26/2020   LDLCALC 79 05/26/2020   TRIG 184 (H) 05/26/2020   CHOLHDL  3.4 05/26/2020    Lab Results  Component Value Date   HGBA1C 5.9 (H) 01/02/2023   Radiology Reports DG Chest Port 1 View Result Date: 02/12/2023 CLINICAL DATA:  Interstitial lung disease EXAM: PORTABLE CHEST 1 VIEW COMPARISON:  02/09/2023 FINDINGS: Single frontal view of the chest demonstrates postsurgical changes from CABG. Cardiac silhouette is enlarged but stable. Diffuse interstitial lung disease is again identified, with bilateral fibrotic changes and scattered ground-glass opacities most pronounced in the right upper and left lower lung zones similar to prior CT exam. No new consolidation, effusion, or pneumothorax. No acute bony abnormalities. IMPRESSION: 1. Stable findings of chronic interstitial lung disease, with fibrosis and ground-glass opacities unchanged since recent high-resolution chest CT 01/07/2023. 2. No evidence of acute intrathoracic process. Electronically Signed   By: Sharlet Salina M.D.    On: 02/12/2023 15:32   ECHOCARDIOGRAM LIMITED BUBBLE STUDY Result Date: 02/10/2023    ECHOCARDIOGRAM LIMITED REPORT   Patient Name:   Gary [name] Date of Exam: 02/10/2023 Medical Rec #:  [id number]         Height:       70.0 in Accession #:    [id number]        Weight:       201.7 lb Date of Birth:  [birth year]          BSA:          2.095 m Patient Age:    77 years          BP:           149/81 mmHg Patient Gender: M                 HR:           75 bpm. Exam Location:  Inpatient Procedure: Limited Echo, Cardiac Doppler, Color Doppler and Saline Contrast            Bubble Study Indications:    ASD  History:        Patient has prior history of Echocardiogram examinations, most                 recent 12/07/2022. CAD; Risk Factors:Hypertension, Diabetes,                 Dyslipidemia, Former Smoker and Sleep Apnea.  Sonographer:    Karma Ganja Referring Phys: [id number] Vinnie Level SMITH IMPRESSIONS  1. Left ventricular ejection fraction, by estimation, is 60 to 65%. The left ventricle has normal function. The left ventricle has no regional wall motion abnormalities. There is moderate concentric left ventricular hypertrophy. Left ventricular diastolic function could not be evaluated.  2. Right ventricular systolic function is normal. The right ventricular size is normal. There is moderately elevated pulmonary artery systolic pressure.  3. The mitral valve is degenerative. Mild mitral valve regurgitation. No evidence of mitral stenosis. Moderate mitral annular calcification.  4. The aortic valve is calcified. There is mild calcification of the aortic valve. Aortic valve regurgitation is not visualized. Aortic valve sclerosis/calcification is present, without any evidence of aortic stenosis.  5. The inferior vena cava is normal in size with greater than 50% respiratory variability, suggesting right atrial pressure of 3 mmHg.  6. Agitated saline contrast bubble study was negative, with no evidence of any interatrial shunt.  FINDINGS  Left Ventricle: Left ventricular ejection fraction, by estimation, is 60 to 65%. The left ventricle has normal function. The left ventricle has no regional wall motion abnormalities. The left ventricular internal  cavity size was normal in size. There is  moderate concentric left ventricular hypertrophy. Left ventricular diastolic function could not be evaluated. Left ventricular diastolic function could not be evaluated due to mitral annular calcification (moderate or greater). Right Ventricle: The right ventricular size is normal. No increase in right ventricular wall thickness. Right ventricular systolic function is normal. There is moderately elevated pulmonary artery systolic pressure. The tricuspid regurgitant velocity is 3.58 m/s, and with an assumed right atrial pressure of 3 mmHg, the estimated right ventricular systolic pressure is 54.3 mmHg. Left Atrium: Left atrial size was not assessed. Right Atrium: Right atrial size was not assessed. Pericardium: There is no evidence of pericardial effusion. Mitral Valve: The mitral valve is degenerative in appearance. Moderate mitral annular calcification. Mild mitral valve regurgitation. No evidence of mitral valve stenosis. Tricuspid Valve: The tricuspid valve is normal in structure. Tricuspid valve regurgitation is mild . No evidence of tricuspid stenosis. Aortic Valve: The aortic valve is calcified. There is mild calcification of the aortic valve. Aortic valve regurgitation is not visualized. Aortic valve sclerosis/calcification is present, without any evidence of aortic stenosis. Pulmonic Valve: The pulmonic valve was not well visualized. Pulmonic valve regurgitation is not visualized. No evidence of pulmonic stenosis. Aorta: Aortic root could not be assessed. Venous: The inferior vena cava is normal in size with greater than 50% respiratory variability, suggesting right atrial pressure of 3 mmHg. IAS/Shunts: No atrial level shunt detected by color flow  Doppler. Agitated saline contrast was given intravenously to evaluate for intracardiac shunting. Agitated saline contrast bubble study was negative, with no evidence of any interatrial shunt. Additional Comments: Spectral Doppler performed. Color Doppler performed.  TRICUSPID VALVE TR Peak grad:   51.3 mmHg TR Vmax:        358.00 cm/s Lavona Mound Tobb DO Electronically signed by Thomasene Ripple DO Signature Date/Time: 02/10/2023/12:47:16 PM    Final    DG Chest Port 1 View Result Date: 02/09/2023 CLINICAL DATA:  Respiratory failure. EXAM: PORTABLE CHEST 1 VIEW COMPARISON:  02/03/2023 FINDINGS: Low volume film. The cardio pericardial silhouette is enlarged. Diffuse interstitial opacity again noted, progressive in the parahilar left mid lung. No substantial pleural effusion. No acute bony abnormality. Telemetry leads overlie the chest. IMPRESSION: Low volume film with progressive interstitial opacity in the parahilar left mid lung. Imaging features could be related to asymmetric edema or infection. Electronically Signed   By: Kennith Center M.D.   On: 02/09/2023 08:54   DG Chest Portable 1 View Result Date: 02/03/2023 CLINICAL DATA:  Shortness of breath. Hypoxia. History of Wegener's and interstitial lung disease. EXAM: PORTABLE CHEST 1 VIEW COMPARISON:  Radiographs 01/05/2023 and 01/01/2023.  CT 01/07/2023. FINDINGS: 1252 hours. The heart size and mediastinal contours are stable status post median sternotomy and CABG. Chronic interstitial lung disease again noted with possible slight worsening on the right which could indicate superimposed edema or inflammation. There is no consolidation, pneumothorax or significant pleural effusion. The bones appear unchanged. IMPRESSION: Chronic interstitial lung disease with possible slight worsening on the right which could indicate superimposed edema or inflammation. Electronically Signed   By: Carey Bullocks M.D.   On: 02/03/2023 14:01   CT Chest High Resolution Result Date:  01/07/2023 CLINICAL DATA:  Inpatient. Diffuse/interstitial lung disease ild flare EXAM: CT CHEST WITHOUT CONTRAST TECHNIQUE: Multidetector CT imaging of the chest was performed following the standard protocol without intravenous contrast. High resolution imaging of the lungs, as well as inspiratory and expiratory imaging, was performed. RADIATION DOSE REDUCTION: This  exam was performed according to the departmental dose-optimization program which includes automated exposure control, adjustment of the mA and/or kV according to patient size and/or use of iterative reconstruction technique. COMPARISON:  01/05/2023 chest radiograph. 11/05/2022 high-resolution chest CT. FINDINGS: Cardiovascular: Stable mild cardiomegaly. No significant pericardial effusion/thickening. Three-vessel coronary atherosclerosis status post CABG. Atherosclerotic nonaneurysmal thoracic aorta. Normal caliber pulmonary arteries. Mediastinum/Nodes: No significant thyroid nodules. Unremarkable esophagus. No axillary adenopathy. Mild right paratracheal adenopathy up to 1.2 cm (series 5/image 45), unchanged. No new pathologically enlarged mediastinal nodes. No discrete hilar adenopathy on these noncontrast images. Lungs/Pleura: No pneumothorax. No pleural effusion. No acute consolidative airspace disease, lung masses or significant pulmonary nodules. Stable mild patchy air trapping in both lungs on the expiration sequence, without evidence of tracheobronchomalacia. Extensive patchy peribronchovascular and subpleural reticulation and ground-glass opacity throughout both lungs with associated mild traction bronchiectasis and architectural distortion. No clear apicobasilar gradient to these findings. Scattered foci of mild honeycombing in both lungs, for example in the peripheral right lung on series 8/image 80 and in the peripheral lower left lung on series 8/image 98). Ground-glass opacities have worsened since 11/05/2022 chest CT. Findings are  otherwise not appreciably changed. Upper abdomen: Cholelithiasis. Musculoskeletal: No aggressive appearing focal osseous lesions. Intact sternotomy wires. Moderate thoracic spondylosis. IMPRESSION: 1. Spectrum of findings compatible with fibrotic interstitial lung disease with mild honeycombing, with worsened diffuse patchy ground-glass opacity in the interval since 11/05/2022 CT. Acute flare of chronic fibrotic interstitial lung disease favored. As previously described, chronic hypersensitivity pneumonitis or fibrotic NSIP are favored. Findings are suggestive of an alternative diagnosis (not UIP) per consensus guidelines: Diagnosis of Idiopathic Pulmonary Fibrosis: An Official ATS/ERS/JRS/ALAT Clinical Practice Guideline. Am Rosezetta Schlatter Crit Care Med Vol 198, Iss 5, [phone], Sep 04 2016. 2. Stable mild cardiomegaly. 3. Cholelithiasis. 4.  Aortic Atherosclerosis (ICD10-I70.0). Electronically Signed   By: Delbert Phenix M.D.   On: 01/07/2023 10:28   CARDIAC CATHETERIZATION Result Date: 01/06/2023 1. Normal filling pressures. 2. Mild pulmonary hypertension without elevated PVR. 3. Normal cardiac output.   VAS [place] LOWER EXTREMITY VENOUS (DVT) Result Date: 01/05/2023  Lower Venous DVT Study Patient Name:  BROLY HATFIELD [facility]  Date of Exam:   01/05/2023 Medical Rec #: [id number]          Accession #:    [id number] Date of Birth: 1945-10-11           Patient Gender: M Patient Age:   55 years Exam Location:  [facility] Procedure:      VAS [place] LOWER EXTREMITY VENOUS (DVT) Referring Phys: Levon Hedger --------------------------------------------------------------------------------  Indications: Edema.  Anticoagulation: Coumadin. Limitations: Poor ultrasound/tissue interface. Performing Technologist: Ernestene Mention RVT, RDMS  Examination Guidelines: A complete evaluation includes B-mode imaging, spectral Doppler, color Doppler, and power Doppler as needed of all accessible portions of each vessel. Bilateral testing is considered  an integral part of a complete examination. Limited examinations for reoccurring indications may be performed as noted. The reflux portion of the exam is performed with the patient in reverse Trendelenburg.  +--------+---------------+---------+-----------+----------+--------------------+ RIGHT   CompressibilityPhasicitySpontaneityPropertiesThrombus Aging       +--------+---------------+---------+-----------+----------+--------------------+ CFV     Full           Yes      Yes                                       +--------+---------------+---------+-----------+----------+--------------------+ SFJ  Full                                                              +--------+---------------+---------+-----------+----------+--------------------+ FV Prox Full           Yes      Yes                                       +--------+---------------+---------+-----------+----------+--------------------+ FV Mid  Full           Yes      Yes                                       +--------+---------------+---------+-----------+----------+--------------------+ FV                     Yes      Yes                  patent by            Distal                                               color/doppler        +--------+---------------+---------+-----------+----------+--------------------+ PFV     Full                                                              +--------+---------------+---------+-----------+----------+--------------------+ POP     Full           Yes      Yes                                       +--------+---------------+---------+-----------+----------+--------------------+ PTV     Full                                                              +--------+---------------+---------+-----------+----------+--------------------+ PERO    Full                                                               +--------+---------------+---------+-----------+----------+--------------------+   +--------+---------------+---------+-----------+----------+--------------------+ LEFT    CompressibilityPhasicitySpontaneityPropertiesThrombus Aging       +--------+---------------+---------+-----------+----------+--------------------+ CFV     Full           Yes  Yes                                       +--------+---------------+---------+-----------+----------+--------------------+ SFJ     Full                                                              +--------+---------------+---------+-----------+----------+--------------------+ FV Prox Full           Yes      Yes                                       +--------+---------------+---------+-----------+----------+--------------------+ FV Mid  Full           Yes      Yes                                       +--------+---------------+---------+-----------+----------+--------------------+ FV                     Yes      Yes                  patent by            Distal                                               color/doppler        +--------+---------------+---------+-----------+----------+--------------------+ PFV     Full                                                              +--------+---------------+---------+-----------+----------+--------------------+ POP     Full           Yes      Yes                                       +--------+---------------+---------+-----------+----------+--------------------+ PTV     Full                                                              +--------+---------------+---------+-----------+----------+--------------------+ PERO    Full                                                              +--------+---------------+---------+-----------+----------+--------------------+  Summary: BILATERAL: - No evidence of deep vein thrombosis seen in the lower  extremities, bilaterally. -No evidence of popliteal cyst, bilaterally.   *See table(s) above for measurements and observations. Electronically signed by Lemar Livings MD on 01/05/2023 at 5:34:57 PM.    Final    DG Chest Port 1 View Result Date: 01/05/2023 CLINICAL DATA:  Acute respiratory failure. History of hypertension and coronary artery disease. EXAM: PORTABLE CHEST 1 VIEW COMPARISON:  01/01/2023 FINDINGS: Previous median sternotomy and CABG procedure. Lung volumes are low. Diffuse increased reticular and nodular interstitial opacities within both lungs compatible with previously characterized chronic fibrotic no superimposed pleural effusion, interstitial edema or airspace consolidation. Interstitial lung disease IMPRESSION: 1. No acute findings. 2. Chronic interstitial lung disease. Electronically Signed   By: Signa Kell M.D.   On: 01/05/2023 12:24   DG Chest Port 1 View Result Date: 01/01/2023 CLINICAL DATA:  Shortness of breath. EXAM: PORTABLE CHEST 1 VIEW COMPARISON:  09/17/2022. FINDINGS: Low lung volume. There are diffuse increased interstitial markings, less pronounced than the prior radiograph from 10/04/2022, which corresponds to chronic interstitial lung disease on recent chest CT scan from 11/05/2022. Bilateral lung fields are otherwise clear. No acute consolidation or lung collapse. Bilateral costophrenic angles are clear. Stable cardio-mediastinal silhouette. Sternotomy wires noted, status post CABG. No acute osseous abnormalities. The soft tissues are within normal limits. IMPRESSION: *No acute cardiopulmonary abnormality.  Interstitial lung disease. Electronically Signed   By: Jules Schick M.D.   On: 01/01/2023 17:19   ECHOCARDIOGRAM COMPLETE Result Date: 12/07/2022    ECHOCARDIOGRAM REPORT   Patient Name:   Gary [name] Date of Exam: 12/07/2022 Medical Rec #:  [id number]         Height:       68.3 in Accession #:    [id number]        Weight:       221.0 lb Date of Birth:  21-Feb-1945           BSA:          2.139 m Patient Age:    77 years          BP:           128/80 mmHg Patient Gender: M                 HR:           81 bpm. Exam Location:  [address] Procedure: 2D Echo, 3D Echo, Cardiac Doppler and Color Doppler Indications:    R06.00 Dyspnea  History:        Patient has prior history of Echocardiogram examinations, most                 recent 03/02/2022. CHF, CAD, Prior CABG, Arrythmias:Atrial                 Fibrillation, Signs/Symptoms:Dyspnea; Risk Factors:Hypertension,                 Dyslipidemia, Former Smoker, Family History of Coronary Artery                 Disease and Sleep Apnea. Interstitial Lung Disease, Wegener's                 Granulomatosis.  Sonographer:    Farrel Conners RDCS Referring Phys: Kalman Shan IMPRESSIONS  1. Left ventricular ejection fraction, by estimation, is 60 to 65%. The left ventricle has normal function. The left ventricle has no regional wall motion abnormalities.  Left ventricular diastolic parameters are consistent with Grade I diastolic dysfunction (impaired relaxation).  2. Mid systolic notching of the pulmonary artery pressure tracing consistent with pulmonary hypertension. RVSP estimated at . . Right ventricular systolic function is normal. The right ventricular size is mildly enlarged.  3. Left atrial size was moderately dilated.  4. Right atrial size was severely dilated.  5. The mitral valve is normal in structure. Trivial mitral valve regurgitation. No evidence of mitral stenosis. Moderate mitral annular calcification.  6. The aortic valve is normal in structure. Aortic valve regurgitation is not visualized. No aortic stenosis is present.  7. Aortic dilatation noted. There is mild dilatation of the ascending aorta, measuring 41 mm.  8. The inferior vena cava is normal in size with greater than 50% respiratory variability, suggesting right atrial pressure of 3 mmHg. FINDINGS  Left Ventricle: Left ventricular ejection fraction, by  estimation, is 60 to 65%. The left ventricle has normal function. The left ventricle has no regional wall motion abnormalities. The left ventricular internal cavity size was normal in size. There is  no left ventricular hypertrophy. Left ventricular diastolic parameters are consistent with Grade I diastolic dysfunction (impaired relaxation). Right Ventricle: Mid systolic notching of the pulmonary artery pressure tracing consistent with pulmonary hypertension. RVSP estimated at . The right ventricular size is mildly enlarged. No increase in right ventricular wall thickness. Right ventricular systolic function is normal. Left Atrium: Left atrial size was moderately dilated. Right Atrium: Right atrial size was severely dilated. Pericardium: There is no evidence of pericardial effusion. Mitral Valve: The mitral valve is normal in structure. Moderate mitral annular calcification. Trivial mitral valve regurgitation. No evidence of mitral valve stenosis. MV peak gradient, 5.7 mmHg. The mean mitral valve gradient is 2.0 mmHg. Tricuspid Valve: The tricuspid valve is normal in structure. Tricuspid valve regurgitation is not demonstrated. No evidence of tricuspid stenosis. Aortic Valve: The aortic valve is normal in structure. Aortic valve regurgitation is not visualized. No aortic stenosis is present. Aortic valve mean gradient measures 7.0 mmHg. Aortic valve peak gradient measures 13.0 mmHg. Aortic valve area, by VTI measures 2.51 cm. Pulmonic Valve: The pulmonic valve was normal in structure. Pulmonic valve regurgitation is not visualized. No evidence of pulmonic stenosis. Aorta: Aortic dilatation noted and the aortic root is normal in size and structure. There is mild dilatation of the ascending aorta, measuring 41 mm. Venous: The inferior vena cava is normal in size with greater than 50% respiratory variability, suggesting right atrial pressure of 3 mmHg. IAS/Shunts: No atrial level shunt detected by color flow  Doppler.  LEFT VENTRICLE PLAX 2D LVIDd:         5.40 cm   Diastology LVIDs:         3.60 cm   LV e' medial:    5.87 cm/s LV PW:         1.10 cm   LV E/e' medial:  16.0 LV IVS:        1.10 cm   LV e' lateral:   10.30 cm/s LVOT diam:     2.60 cm   LV E/e' lateral: 9.1 LV SV:         99 LV SV Index:   46 LVOT Area:     5.31 cm                           3D Volume EF:  3D EF:        60 %                          LV EDV:       187 ml                          LV ESV:       75 ml                          LV SV:        113 ml RIGHT VENTRICLE RV Basal diam:  4.50 cm RV Mid diam:    3.50 cm RV S prime:     16.10 cm/s TAPSE (M-mode): 2.3 cm RVSP:           59.2 mmHg LEFT ATRIUM              Index        RIGHT ATRIUM           Index LA diam:        5.30 cm  2.48 cm/m   RA Pressure: 3.00 mmHg LA Vol (A2C):   79.1 ml  36.99 ml/m  RA Area:     31.20 cm LA Vol (A4C):   112.0 ml 52.37 ml/m  RA Volume:   122.00 ml 57.05 ml/m LA Biplane Vol: 98.6 ml  46.11 ml/m  AORTIC VALVE AV Area (Vmax):    2.48 cm AV Area (Vmean):   2.44 cm AV Area (VTI):     2.51 cm AV Vmax:           180.00 cm/s AV Vmean:          121.000 cm/s AV VTI:            0.393 m AV Peak Grad:      13.0 mmHg AV Mean Grad:      7.0 mmHg LVOT Vmax:         84.10 cm/s LVOT Vmean:        55.550 cm/s LVOT VTI:          0.186 m LVOT/AV VTI ratio: 0.47  AORTA Ao Root diam: 3.40 cm Ao Asc diam:  4.05 cm MITRAL VALVE               TRICUSPID VALVE MV Area (PHT): cm         TR Peak grad:   56.2 mmHg MV Area VTI:   2.87 cm    TR Vmax:        375.00 cm/s MV Peak grad:  5.7 mmHg    Estimated RAP:  3.00 mmHg MV Mean grad:  2.0 mmHg    RVSP:           59.2 mmHg MV Vmax:       1.19 m/s MV Vmean:      55.7 cm/s   SHUNTS MV Decel Time: 219 msec    Systemic VTI:  0.19 m MV E velocity: 94.10 cm/s  Systemic Diam: 2.60 cm MV A velocity: 67.15 cm/s MV E/A ratio:  1.40 Clearnce Hasten Electronically signed by Clearnce Hasten Signature Date/Time:  12/07/2022/4:32:10 PM    Final       Signature  -   Susa Raring M.D on 02/18/2023 at 11:33 AM   -  To page go to [email or web address]

## 2023-02-19 DIAGNOSIS — J9621 Acute and chronic respiratory failure with hypoxia: Secondary | ICD-10-CM | POA: Diagnosis not present

## 2023-02-19 LAB — GLUCOSE, CAPILLARY
Glucose-Capillary: 140 mg/dL — ABNORMAL HIGH (ref 70–99)
Glucose-Capillary: 184 mg/dL — ABNORMAL HIGH (ref 70–99)
Glucose-Capillary: 180 mg/dL — ABNORMAL HIGH (ref 70–99)
Glucose-Capillary: 91 mg/dL (ref 70–99)

## 2023-02-19 LAB — CBC WITH DIFFERENTIAL/PLATELET
Abs Immature Granulocytes: 0.04 10*3/uL (ref 0.00–0.07)
Basophils Absolute: 0 10*3/uL (ref 0.0–0.1)
Basophils Relative: 0 %
Eosinophils Absolute: 0 10*3/uL (ref 0.0–0.5)
Eosinophils Relative: 0 %
HCT: 26.5 % — ABNORMAL LOW (ref 39.0–52.0)
Hemoglobin: 7.8 g/dL — ABNORMAL LOW (ref 13.0–17.0)
Immature Granulocytes: 1 %
Lymphocytes Relative: 1 %
Lymphs Abs: 0.1 10*3/uL — ABNORMAL LOW (ref 0.7–4.0)
MCH: 26.2 pg (ref 26.0–34.0)
MCHC: 29.4 g/dL — ABNORMAL LOW (ref 30.0–36.0)
MCV: 88.9 fL (ref 80.0–100.0)
Monocytes Absolute: 0.1 10*3/uL (ref 0.1–1.0)
Monocytes Relative: 1 %
Neutro Abs: 7.5 10*3/uL (ref 1.7–7.7)
Neutrophils Relative %: 97 %
Platelets: 386 10*3/uL (ref 150–400)
RBC: 2.98 MIL/uL — ABNORMAL LOW (ref 4.22–5.81)
RDW: 23.8 % — ABNORMAL HIGH (ref 11.5–15.5)
WBC: 7.7 10*3/uL (ref 4.0–10.5)
nRBC: 0 % (ref 0.0–0.2)

## 2023-02-19 LAB — BASIC METABOLIC PANEL
Anion gap: 12 (ref 5–15)
BUN: 28 mg/dL — ABNORMAL HIGH (ref 8–23)
CO2: 29 mmol/L (ref 22–32)
Calcium: 8.8 mg/dL — ABNORMAL LOW (ref 8.9–10.3)
Chloride: 97 mmol/L — ABNORMAL LOW (ref 98–111)
Creatinine, Ser: 0.75 mg/dL (ref 0.61–1.24)
GFR, Estimated: >60 mL/min (ref >60)
Glucose, Bld: 135 mg/dL — ABNORMAL HIGH (ref 70–99)
Potassium: 4 mmol/L (ref 3.5–5.1)
Sodium: 138 mmol/L (ref 135–145)

## 2023-02-19 LAB — MAGNESIUM: Magnesium: 2.3 mg/dL (ref 1.7–2.4)

## 2023-02-19 LAB — PROCALCITONIN: Procalcitonin: 0.42 ng/mL

## 2023-02-19 LAB — BRAIN NATRIURETIC PEPTIDE: B Natriuretic Peptide: 243.6 pg/mL — ABNORMAL HIGH (ref 0.0–100.0)

## 2023-02-19 NOTE — Progress Notes (Signed)
NAME:  Gary [name], MRN:  [id number], DOB:  05/14/1945, LOS: 16 ADMISSION DATE:  02/03/2023, CONSULTATION DATE:  02/03/2023 REFERRING MD:  Dr. Susa Raring, CHIEF COMPLAINT:  dyspnea, hypoxia   History of Present Illness:  This 78 year old Caucasian male is seen in consultation at the request of Dr. Susa Raring for recommendations on further evaluation and management of hypoxia.  The patient was sent to [facility] emergency department from [facility] pulmonary clinic today, after he presented with reports of having profound hypoxia at home (down to SpO2 45%).  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  He is on rituximab and steroids.  In fact, he was recently hospitalized for interstitial lung disease flareup and has been on a prednisone taper, currently on prednisone 40 mg p.o. daily.  Treated with high-dose steroids.  Right heart cath on 01/04/2023 showed normal filling pressures, mild pulmonary hypertension and preserved cardiac output.  He reported doing well after his recent hospitalization and felt that he had a very good day yesterday.  However, this morning he woke up and experienced extreme exertional dyspnea while getting from his bed to the bathroom, when he noted that his SpO2 went down to 45%.  He is normally on supplemental oxygen at 2-2.5 LPM via nasal cannula.  He does titrate as needed to maintain SpO2 90%.  However, he noted that despite turning his supplemental oxygen up to 6 LPM (which he typically needs during ambulation), he was not recovering.  He denies cough.  He denies pleuritic pain.  He denies sputum production.  He denies chest pain.  He does state that when he was profoundly hypoxic he did notice that he was experiencing blurring of vision.  He has noted some modest increased swelling in his lower extremities.  He does confess that he is prescribed daily furosemide but is inconsistent with adherence to  prescribed therapy.  Patient says he has had a very stressful and very active week, which revolves around having to get to multiple medical appointments.  He had an iron infusion a few days ago.  He also had a Rituxan infusion earlier this week.  Patient says he has been having blood in his stools, although he says that this is not any more than usual.  This has been a chronic problem ever since his diagnosis of Wegener's granulomatosis.    He is on Coumadin for chronic atrial fibrillation.  His last INR check (01/24/2023) was supratherapeutic, 6.7.  However, upon recheck in the emergency department, this has fallen into the therapeutic range, 2.7.  Last hemoglobin reported by patient was 8.2, currently 8.7.   Pertinent  Medical History  PAST MEDICAL history significant for Wegener's granulomatosis (diagnosed 2019, on Rituxan and steroids), type 2 diabetes mellitus, OSA on CPAP, atrial fibrillation on coumadin, HFpEF (LVEF 65 in 12/28/2022, grade 1 diastolic dysfunction), pulmonary hypertension, coronary artery disease.  Significant Hospital Events: Including procedures, antibiotic start and stop dates in addition to other pertinent events   1/30 presented with shortness of breath and hypoxia to steroids started, solumedrol 125mg  q12 1/31 states he feels well but is requiring 8 to 10 L high flow nasal cannula 2/1 Started Cellcept 500mg  bid 2/2 no acute issues overnight, remains on 6 L high flow 2/7 Steroids reduced to solumedrol 125 mg daily 2/9 Prednisone 30mg  IV bid 2/10 Cellcept increased to 1000mg  bid 2/12 1 Dose cytoxan given 750mg /m2 2/14 Oxygen requirement back up to 5L [place], patient  states he is considering hospice referral   Interim History / Subjective:  Seen sitting up in bedside recliner with flat affect. States he doesn't think he will get any better and is considereing hospice   Objective   Blood pressure 134/61, pulse 71, temperature 99.3 F (37.4 C), temperature source Oral,  resp. rate 13, height 5\' 10"  (1.778 m), weight 86.7 kg, SpO2 100%.        Intake/Output Summary (Last 24 hours) at 02/19/2023 1436 Last data filed at 02/19/2023 1610 Gross per 24 hour  Intake 100 ml  Output 2150 ml  Net -2050 ml   Filed Weights   02/18/23 0533 02/19/23 0500 02/19/23 0656  Weight: 91 kg 91.4 kg 86.7 kg    Examination: General: Acute on chronically ill appearing elderly male lying in bed in NAD  HEENT: /AT, MM pink/moist, PERRL,  Neuro: Alert and oriented x3, non-focal  CV: s1s2 regular rate and rhythm, no murmur, rubs, or gallops,  PULM:  Diminished bilaterally, no increased work of breathing, on 8L   GI: soft, bowel sounds active in all 4 quadrants, non-tender, non-distended, tolerating oral diet  Extremities: warm/dry, no edema  Skin: no rashes or lesions  Resolved Hospital Problem list   Not applicable.  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  No clinical signs of signfiicant 3rd spacing.  I/O net negative. Oxygen requireement has continued to increase: was on 4 LPM briefly, but now on 10 LPM..  Bubble study negative.  LVEF good On steroid therapy with no significant improvement. The patient has voice his wishes to seek consultation from Hospice with hopes of transitioning to inpatient hospice care.  I agree.  Signature:  Marcelle Smiling, MD Board Certified by the [employer], Pulmonary Diseases & Critical Care Medicine  Personal contact information can be found on Amion  If no contact or response made please call [phone] 02/19/2023, 2:36 PM

## 2023-02-19 NOTE — Progress Notes (Signed)
PROGRESS NOTE        PATIENT DETAILS Name: Gary [name] Age: 78 y.o. Sex: male Date of Birth: Oct 14, 1945 Admit Date: 02/03/2023 Admitting Physician Leroy Sea, MD ZOX:WRUEAV, [name], DO  Brief Summary: Patient is a 78 y.o.  male with history of granulomatosis polyangiitis-ILD-chronic hypoxic respiratory failure on 3-4 L of oxygen at home, CAD s/p PCI September 2023, A-fib on warfarin, chronic HFpEF-recent hospitalization for ILD flare-on tapering steroids-presented with shortness of breath-found to have acute hypoxic respiratory failure-requiring HFNC-secondary to ILD flare (while on prednisone 40 mg daily)   Significant events: 1/30>> admit to [facility]  Significant studies: 1/30>> CXR: Worsening right-sided infiltrates. 2/06>> Echo:EF 60-65%, agitated saline contrast bubble study negative-normal evidence of interatrial shunt.  Significant microbiology data: 1/30>> respiratory virus panel: Negative 1/30>> COVID/influenza/RSV PCR: Negative  Procedures: None  Consults: PCCM, palliative care  Subjective: Sitting in recliner denies any headache or chest pain, no abdominal pain no weakness, still is quite short of breath, now wants to pursue comfort measures and does not want any further blood draws  Objective: Vitals: Blood pressure 125/77, pulse 72, temperature 99.3 F (37.4 C), temperature source Oral, resp. rate (!) 24, height 5\' 10"  (1.778 m), weight 86.7 kg, SpO2 99%.   Exam:  Awake Alert, No new F.N deficits, Normal affect [place].AT,PERRAL Supple Neck, No JVD,   Symmetrical Chest wall movement, Good air movement bilaterally, chronic bibasilar fine crackles RRR,No Gallops, Rubs or new Murmurs,  +ve B.Sounds, Abd Soft, No tenderness,   No Cyanosis, Clubbing or edema     Assessment/Plan:  Acute on chronic hypoxic respiratory failure secondary to GPA associated ILD (on home O2-anywhere from 3-4 L) Slowly improving-Down to 4-6 L of oxygen-at  one point several days back-he was on 12 L of HFNC . Although hypoxia slowly improving-he continues to have severe exertional dyspnea with minimal activity with significant drops in saturation-with slow but gradual recovery with rest.  He seems a bit more fatigued/tired today. Remains on CellCept ( dose doubled on 02/14/23) initially on IV Solu-Medrol thereafter switch to 60 mg of prednisone, since he declined he was switched back to IV Solu-Medrol on 02/18/2023, received Cytoxan infusion on 02/16/2023, initially improved but now worse again on 02/18/2023.  Leukocyte count has gotten worse, repeat chest x-ray, repeat a course of Rocephin and doxycycline, IV Lasix on 02/18/2023, replaced on IV Solu-Medrol however no significant improvement.  PCCM continues to follow, unfortunately he has declined again on 02/18/2023, patient now is saying that he does not want any further blood work or workup, he wants to pursue comfort measures and hospice, palliative care will be involved, I had already discussed this patient's wife on 02/18/2023 and she is open for hospice if patient is agreeable.  SpO2: 99 % O2 Flow Rate (L/min): 8 L/min FiO2 (%): 40 %    History of granulomatosis with polyangiitis (GPA) Was on tapering steroids (down to 40 mg)-when he developed worsening respiratory failure Prophylactic dapsone for PJP (no Bactrim as interaction with Tikosyn) PCCM started CellCept-no longer on Rituxan Currently on IV Solu-Medrol-dose reduced from twice daily to daily on 2/7.  Paroxysmal atrial fibrillation Maintaining sinus rhythm Tikosyn/Coreg Has been transitioned to Eliquis-no plans to restart Coumadin on discharge.  Chronic HFpEF Euvolemic-on Lasix to ensure negative balance.  History of CAD s/p PCI September 2023 Aspirin No longer on Plavix-this was also  not on his most recent discharge list of medications.  Iron deficiency anemia Appreciate GI input-plans are for outpatient endoscopy evaluation once  his respiratory status improves-No overt bleeding noted-some drop in Hb but no obvious evidence of GI bleed.  Given IV iron in 2/4-follow CBC  Hypothyroidism Synthroid  Mood disorder Stable Continue Effexor   DM-2 with steroid related hyperglycemia CBGs continue to fluctuate-elevated yesterday-borderline hypoglycemic today Decrease Semglee to 10 units Continue SSI Follow-up with   Recent Labs    02/18/23 1640 02/18/23 2111 02/19/23 0847  GLUCAP 81 258* 140*     BMI: Estimated body mass index is 27.43 kg/m as calculated from the following:   Height as of this encounter: 5\' 10"  (1.778 m).   Weight as of this encounter: 86.7 kg.   Code status:   Code Status: Full Code   DVT Prophylaxis: SCDs Start: 02/03/23 1523 apixaban (ELIQUIS) tablet 5 mg    Family Communication:Spouse [name] on 02/18/2023   Disposition Plan: Status is: Inpatient Remains inpatient appropriate because: Severity of illness   Planned Discharge Destination:Home health   Diet: Diet Order             Diet regular Fluid consistency: Thin  Diet effective now                     Antimicrobial agents: Anti-infectives (From admission, onward)    Start     Dose/Rate Route Frequency Ordered Stop   02/18/23 1230  doxycycline (VIBRA-TABS) tablet 100 mg        100 mg Oral Every 12 hours 02/18/23 1133     02/18/23 1230  cefTRIAXone (ROCEPHIN) 1 g in sodium chloride 0.9 % 100 mL IVPB        1 g 200 mL/hr over 30 Minutes Intravenous Every 24 hours 02/18/23 1133 02/23/23 1229   02/03/23 1445  dapsone tablet 100 mg        100 mg Oral Daily 02/03/23 1442          MEDICATIONS: Scheduled Meds:  ALPRAZolam  0.5 mg Oral QHS   apixaban  5 mg Oral BID   aspirin  81 mg Oral Daily   carvedilol  3.125 mg Oral BID WC   dapsone  100 mg Oral Daily   dofetilide  500 mcg Oral BID   doxycycline  100 mg Oral Q12H   dronabinol  5 mg Oral QAC lunch   feeding supplement (GLUCERNA SHAKE)  237  mL Oral BID BM   insulin aspart  0-20 Units Subcutaneous TID WC   insulin aspart  0-5 Units Subcutaneous QHS   insulin aspart  4 Units Subcutaneous TID WC   insulin glargine-yfgn  12 Units Subcutaneous Daily   levothyroxine  137 mcg Oral Q0600   methylPREDNISolone (SOLU-MEDROL) injection  40 mg Intravenous Q6H   mycophenolate  1,000 mg Oral BID   pantoprazole  40 mg Oral BID   venlafaxine XR  150 mg Oral Daily   Continuous Infusions:  cefTRIAXone (ROCEPHIN)  IV Stopped (02/18/23 1345)     PRN Meds:.acetaminophen **OR** acetaminophen, bisacodyl, chlorpheniramine-HYDROcodone, metoprolol tartrate, nitroGLYCERIN, mouth rinse   I have personally reviewed following labs and imaging studies  LABORATORY DATA:   Data Review:    Recent Labs  Lab 02/15/23 0501 02/15/23 1857 02/16/23 0456 02/17/23 0455 02/18/23 0842 02/19/23 0529  WBC 8.8 8.5 8.0 8.5 14.6* 7.7  HGB 8.7* 8.0* 8.3* 8.0* 9.3* 7.8*  HCT 30.2* 26.6* 28.4* 27.5* 32.6* 26.5*  PLT 395 335 367 394 555* 386  MCV 89.3 89.3 89.3 90.5 91.3 88.9  MCH 25.7* 26.8 26.1 26.3 26.1 26.2  MCHC 28.8* 30.1 29.2* 29.1* 28.5* 29.4*  RDW 25.2* 25.2* 24.7* 24.0* 24.2* 23.8*  LYMPHSABS 0.6*  --  0.4* 0.4* 0.4* 0.1*  MONOABS 0.4  --  0.3 0.4 0.2 0.1  EOSABS 0.0  --  0.0 0.0 0.0 0.0  BASOSABS 0.0  --  0.0 0.0 0.0 0.0    Recent Labs  Lab 02/13/23 0544 02/13/23 0748 02/14/23 0514 02/15/23 0501 02/15/23 1857 02/16/23 0456 02/17/23 0455 02/18/23 0559 02/19/23 0529  NA 141  --  142 140 137 137 140 139 138  K 4.3  --  4.2 4.5 3.9 3.8 4.6 4.1 4.0  CL 99  --  92* 99 97* 96* 97* 98 97*  CO2 31  --  33* 29 29 30 31 28 29   ANIONGAP 11  --  17* 12 11 11 12 13 12   GLUCOSE 151*  --  96 143* 126* 101* 139* 67* 135*  BUN 38*  --  29* 36* 34* 32* 23 27* 28*  CREATININE 0.86  --  0.80 0.98 0.83 0.78 0.87 0.83 0.75  AST  --   --   --   --  33  --   --   --   --   ALT  --   --   --   --  59*  --   --   --   --   ALKPHOS  --   --   --   --   170*  --   --   --   --   BILITOT  --   --   --   --  0.5  --   --   --   --   ALBUMIN  --   --   --   --  2.6*  --   --   --   --   CRP 11.1*  --  12.7* 15.8*  --  12.0*  --   --   --   PROCALCITON 0.54  --  0.36 0.46  --  0.20 0.19  --  0.42  BNP  --  226.7* 155.0* 174.3*  --  106.4*  --   --  243.6*  MG 2.3  --  2.4 2.3  --  2.3  --  2.2 2.3  PHOS 2.6  --  3.2 3.3  --  3.6  --   --   --   CALCIUM 9.6  --  10.2 9.4 9.0 9.0 9.4 8.9 8.8*      Recent Labs  Lab 02/13/23 0544 02/13/23 0748 02/14/23 0514 02/15/23 0501 02/15/23 1857 02/16/23 0456 02/17/23 0455 02/18/23 0559 02/19/23 0529  CRP 11.1*  --  12.7* 15.8*  --  12.0*  --   --   --   PROCALCITON 0.54  --  0.36 0.46  --  0.20 0.19  --  0.42  BNP  --  226.7* 155.0* 174.3*  --  106.4*  --   --  243.6*  MG 2.3  --  2.4 2.3  --  2.3  --  2.2 2.3  CALCIUM 9.6  --  10.2 9.4 9.0 9.0 9.4 8.9 8.8*    --------------------------------------------------------------------------------------------------------------- Lab Results  Component Value Date   CHOL 155 05/26/2020   HDL 45 05/26/2020   LDLCALC 79 05/26/2020   TRIG 184 (H) 05/26/2020   CHOLHDL  3.4 05/26/2020    Lab Results  Component Value Date   HGBA1C 5.9 (H) 01/02/2023   Radiology Reports DG Chest Port 1 View Result Date: 02/18/2023 CLINICAL DATA:  Shortness of breath. EXAM: PORTABLE CHEST 1 VIEW COMPARISON:  02/12/2023, CT 01/07/2023 FINDINGS: Prior median sternotomy. Stable heart size and mediastinal contours. Chronic interstitial lung disease with diffuse reticular opacities. No superimposed acute airspace disease. No pleural fluid or pneumothorax. IMPRESSION: Chronic interstitial lung disease. No superimposed acute finding. Electronically Signed   By: Narda Rutherford M.D.   On: 02/18/2023 15:47   DG Chest Port 1 View Result Date: 02/12/2023 CLINICAL DATA:  Interstitial lung disease EXAM: PORTABLE CHEST 1 VIEW COMPARISON:  02/09/2023 FINDINGS: Single frontal view of  the chest demonstrates postsurgical changes from CABG. Cardiac silhouette is enlarged but stable. Diffuse interstitial lung disease is again identified, with bilateral fibrotic changes and scattered ground-glass opacities most pronounced in the right upper and left lower lung zones similar to prior CT exam. No new consolidation, effusion, or pneumothorax. No acute bony abnormalities. IMPRESSION: 1. Stable findings of chronic interstitial lung disease, with fibrosis and ground-glass opacities unchanged since recent high-resolution chest CT 01/07/2023. 2. No evidence of acute intrathoracic process. Electronically Signed   By: Sharlet Salina M.D.   On: 02/12/2023 15:32   ECHOCARDIOGRAM LIMITED BUBBLE STUDY Result Date: 02/10/2023    ECHOCARDIOGRAM LIMITED REPORT   Patient Name:   ZACARI STIFF Date of Exam: 02/10/2023 Medical Rec #:  [id number]         Height:       70.0 in Accession #:    [id number]        Weight:       201.7 lb Date of Birth:  01-31-1945          BSA:          2.095 m Patient Age:    77 years          BP:           149/81 mmHg Patient Gender: M                 HR:           75 bpm. Exam Location:  Inpatient Procedure: Limited Echo, Cardiac Doppler, Color Doppler and Saline Contrast            Bubble Study Indications:    ASD  History:        Patient has prior history of Echocardiogram examinations, most                 recent 12/07/2022. CAD; Risk Factors:Hypertension, Diabetes,                 Dyslipidemia, Former Smoker and Sleep Apnea.  Sonographer:    Karma Ganja Referring Phys: [id number] Vinnie Level SMITH IMPRESSIONS  1. Left ventricular ejection fraction, by estimation, is 60 to 65%. The left ventricle has normal function. The left ventricle has no regional wall motion abnormalities. There is moderate concentric left ventricular hypertrophy. Left ventricular diastolic function could not be evaluated.  2. Right ventricular systolic function is normal. The right ventricular size is normal. There is  moderately elevated pulmonary artery systolic pressure.  3. The mitral valve is degenerative. Mild mitral valve regurgitation. No evidence of mitral stenosis. Moderate mitral annular calcification.  4. The aortic valve is calcified. There is mild calcification of the aortic valve. Aortic valve regurgitation is not visualized. Aortic valve sclerosis/calcification is present, without  any evidence of aortic stenosis.  5. The inferior vena cava is normal in size with greater than 50% respiratory variability, suggesting right atrial pressure of 3 mmHg.  6. Agitated saline contrast bubble study was negative, with no evidence of any interatrial shunt. FINDINGS  Left Ventricle: Left ventricular ejection fraction, by estimation, is 60 to 65%. The left ventricle has normal function. The left ventricle has no regional wall motion abnormalities. The left ventricular internal cavity size was normal in size. There is  moderate concentric left ventricular hypertrophy. Left ventricular diastolic function could not be evaluated. Left ventricular diastolic function could not be evaluated due to mitral annular calcification (moderate or greater). Right Ventricle: The right ventricular size is normal. No increase in right ventricular wall thickness. Right ventricular systolic function is normal. There is moderately elevated pulmonary artery systolic pressure. The tricuspid regurgitant velocity is 3.58 m/s, and with an assumed right atrial pressure of 3 mmHg, the estimated right ventricular systolic pressure is 54.3 mmHg. Left Atrium: Left atrial size was not assessed. Right Atrium: Right atrial size was not assessed. Pericardium: There is no evidence of pericardial effusion. Mitral Valve: The mitral valve is degenerative in appearance. Moderate mitral annular calcification. Mild mitral valve regurgitation. No evidence of mitral valve stenosis. Tricuspid Valve: The tricuspid valve is normal in structure. Tricuspid valve regurgitation is  mild . No evidence of tricuspid stenosis. Aortic Valve: The aortic valve is calcified. There is mild calcification of the aortic valve. Aortic valve regurgitation is not visualized. Aortic valve sclerosis/calcification is present, without any evidence of aortic stenosis. Pulmonic Valve: The pulmonic valve was not well visualized. Pulmonic valve regurgitation is not visualized. No evidence of pulmonic stenosis. Aorta: Aortic root could not be assessed. Venous: The inferior vena cava is normal in size with greater than 50% respiratory variability, suggesting right atrial pressure of 3 mmHg. IAS/Shunts: No atrial level shunt detected by color flow Doppler. Agitated saline contrast was given intravenously to evaluate for intracardiac shunting. Agitated saline contrast bubble study was negative, with no evidence of any interatrial shunt. Additional Comments: Spectral Doppler performed. Color Doppler performed.  TRICUSPID VALVE TR Peak grad:   51.3 mmHg TR Vmax:        358.00 cm/s Lavona Mound Tobb DO Electronically signed by Thomasene Ripple DO Signature Date/Time: 02/10/2023/12:47:16 PM    Final    DG Chest Port 1 View Result Date: 02/09/2023 CLINICAL DATA:  Respiratory failure. EXAM: PORTABLE CHEST 1 VIEW COMPARISON:  02/03/2023 FINDINGS: Low volume film. The cardio pericardial silhouette is enlarged. Diffuse interstitial opacity again noted, progressive in the parahilar left mid lung. No substantial pleural effusion. No acute bony abnormality. Telemetry leads overlie the chest. IMPRESSION: Low volume film with progressive interstitial opacity in the parahilar left mid lung. Imaging features could be related to asymmetric edema or infection. Electronically Signed   By: Kennith Center M.D.   On: 02/09/2023 08:54   DG Chest Portable 1 View Result Date: 02/03/2023 CLINICAL DATA:  Shortness of breath. Hypoxia. History of Wegener's and interstitial lung disease. EXAM: PORTABLE CHEST 1 VIEW COMPARISON:  Radiographs 01/05/2023 and  01/01/2023.  CT 01/07/2023. FINDINGS: 1252 hours. The heart size and mediastinal contours are stable status post median sternotomy and CABG. Chronic interstitial lung disease again noted with possible slight worsening on the right which could indicate superimposed edema or inflammation. There is no consolidation, pneumothorax or significant pleural effusion. The bones appear unchanged. IMPRESSION: Chronic interstitial lung disease with possible slight worsening on the right which could  indicate superimposed edema or inflammation. Electronically Signed   By: Carey Bullocks M.D.   On: 02/03/2023 14:01   CT Chest High Resolution Result Date: 01/07/2023 CLINICAL DATA:  Inpatient. Diffuse/interstitial lung disease ild flare EXAM: CT CHEST WITHOUT CONTRAST TECHNIQUE: Multidetector CT imaging of the chest was performed following the standard protocol without intravenous contrast. High resolution imaging of the lungs, as well as inspiratory and expiratory imaging, was performed. RADIATION DOSE REDUCTION: This exam was performed according to the departmental dose-optimization program which includes automated exposure control, adjustment of the mA and/or kV according to patient size and/or use of iterative reconstruction technique. COMPARISON:  01/05/2023 chest radiograph. 11/05/2022 high-resolution chest CT. FINDINGS: Cardiovascular: Stable mild cardiomegaly. No significant pericardial effusion/thickening. Three-vessel coronary atherosclerosis status post CABG. Atherosclerotic nonaneurysmal thoracic aorta. Normal caliber pulmonary arteries. Mediastinum/Nodes: No significant thyroid nodules. Unremarkable esophagus. No axillary adenopathy. Mild right paratracheal adenopathy up to 1.2 cm (series 5/image 45), unchanged. No new pathologically enlarged mediastinal nodes. No discrete hilar adenopathy on these noncontrast images. Lungs/Pleura: No pneumothorax. No pleural effusion. No acute consolidative airspace disease, lung  masses or significant pulmonary nodules. Stable mild patchy air trapping in both lungs on the expiration sequence, without evidence of tracheobronchomalacia. Extensive patchy peribronchovascular and subpleural reticulation and ground-glass opacity throughout both lungs with associated mild traction bronchiectasis and architectural distortion. No clear apicobasilar gradient to these findings. Scattered foci of mild honeycombing in both lungs, for example in the peripheral right lung on series 8/image 80 and in the peripheral lower left lung on series 8/image 98). Ground-glass opacities have worsened since 11/05/2022 chest CT. Findings are otherwise not appreciably changed. Upper abdomen: Cholelithiasis. Musculoskeletal: No aggressive appearing focal osseous lesions. Intact sternotomy wires. Moderate thoracic spondylosis. IMPRESSION: 1. Spectrum of findings compatible with fibrotic interstitial lung disease with mild honeycombing, with worsened diffuse patchy ground-glass opacity in the interval since 11/05/2022 CT. Acute flare of chronic fibrotic interstitial lung disease favored. As previously described, chronic hypersensitivity pneumonitis or fibrotic NSIP are favored. Findings are suggestive of an alternative diagnosis (not UIP) per consensus guidelines: Diagnosis of Idiopathic Pulmonary Fibrosis: An Official ATS/ERS/JRS/ALAT Clinical Practice Guideline. Am Rosezetta Schlatter Crit Care Med Vol 198, Iss 5, [id number], Sep 04 2016. 2. Stable mild cardiomegaly. 3. Cholelithiasis. 4.  Aortic Atherosclerosis (ICD10-I70.0). Electronically Signed   By: Delbert Phenix M.D.   On: 01/07/2023 10:28   CARDIAC CATHETERIZATION Result Date: 01/06/2023 1. Normal filling pressures. 2. Mild pulmonary hypertension without elevated PVR. 3. Normal cardiac output.   VAS [place] LOWER EXTREMITY VENOUS (DVT) Result Date: 01/05/2023  Lower Venous DVT Study Patient Name:  CAREL CARRIER [facility]  Date of Exam:   01/05/2023 Medical Rec #: [id number]           Accession #:    [id number] Date of Birth: 03-Jul-1945           Patient Gender: M Patient Age:   22 years Exam Location:  [facility] Procedure:      VAS [place] LOWER EXTREMITY VENOUS (DVT) Referring Phys: Levon Hedger --------------------------------------------------------------------------------  Indications: Edema.  Anticoagulation: Coumadin. Limitations: Poor ultrasound/tissue interface. Performing Technologist: Ernestene Mention RVT, RDMS  Examination Guidelines: A complete evaluation includes B-mode imaging, spectral Doppler, color Doppler, and power Doppler as needed of all accessible portions of each vessel. Bilateral testing is considered an integral part of a complete examination. Limited examinations for reoccurring indications may be performed as noted. The reflux portion of the exam is performed with the patient in reverse Trendelenburg.  +--------+---------------+---------+-----------+----------+--------------------+ RIGHT  CompressibilityPhasicitySpontaneityPropertiesThrombus Aging       +--------+---------------+---------+-----------+----------+--------------------+ CFV     Full           Yes      Yes                                       +--------+---------------+---------+-----------+----------+--------------------+ SFJ     Full                                                              +--------+---------------+---------+-----------+----------+--------------------+ FV Prox Full           Yes      Yes                                       +--------+---------------+---------+-----------+----------+--------------------+ FV Mid  Full           Yes      Yes                                       +--------+---------------+---------+-----------+----------+--------------------+ FV                     Yes      Yes                  patent by            Distal                                               color/doppler         +--------+---------------+---------+-----------+----------+--------------------+ PFV     Full                                                              +--------+---------------+---------+-----------+----------+--------------------+ POP     Full           Yes      Yes                                       +--------+---------------+---------+-----------+----------+--------------------+ PTV     Full                                                              +--------+---------------+---------+-----------+----------+--------------------+ PERO    Full                                                              +--------+---------------+---------+-----------+----------+--------------------+   +--------+---------------+---------+-----------+----------+--------------------+  LEFT    CompressibilityPhasicitySpontaneityPropertiesThrombus Aging       +--------+---------------+---------+-----------+----------+--------------------+ CFV     Full           Yes      Yes                                       +--------+---------------+---------+-----------+----------+--------------------+ SFJ     Full                                                              +--------+---------------+---------+-----------+----------+--------------------+ FV Prox Full           Yes      Yes                                       +--------+---------------+---------+-----------+----------+--------------------+ FV Mid  Full           Yes      Yes                                       +--------+---------------+---------+-----------+----------+--------------------+ FV                     Yes      Yes                  patent by            Distal                                               color/doppler        +--------+---------------+---------+-----------+----------+--------------------+ PFV     Full                                                               +--------+---------------+---------+-----------+----------+--------------------+ POP     Full           Yes      Yes                                       +--------+---------------+---------+-----------+----------+--------------------+ PTV     Full                                                              +--------+---------------+---------+-----------+----------+--------------------+ PERO    Full                                                              +--------+---------------+---------+-----------+----------+--------------------+  Summary: BILATERAL: - No evidence of deep vein thrombosis seen in the lower extremities, bilaterally. -No evidence of popliteal cyst, bilaterally.   *See table(s) above for measurements and observations. Electronically signed by Lemar Livings MD on 01/05/2023 at 5:34:57 PM.    Final    DG Chest Port 1 View Result Date: 01/05/2023 CLINICAL DATA:  Acute respiratory failure. History of hypertension and coronary artery disease. EXAM: PORTABLE CHEST 1 VIEW COMPARISON:  01/01/2023 FINDINGS: Previous median sternotomy and CABG procedure. Lung volumes are low. Diffuse increased reticular and nodular interstitial opacities within both lungs compatible with previously characterized chronic fibrotic no superimposed pleural effusion, interstitial edema or airspace consolidation. Interstitial lung disease IMPRESSION: 1. No acute findings. 2. Chronic interstitial lung disease. Electronically Signed   By: Signa Kell M.D.   On: 01/05/2023 12:24   DG Chest Port 1 View Result Date: 01/01/2023 CLINICAL DATA:  Shortness of breath. EXAM: PORTABLE CHEST 1 VIEW COMPARISON:  09/17/2022. FINDINGS: Low lung volume. There are diffuse increased interstitial markings, less pronounced than the prior radiograph from 10/04/2022, which corresponds to chronic interstitial lung disease on recent chest CT scan from 11/05/2022. Bilateral lung fields are otherwise clear. No acute  consolidation or lung collapse. Bilateral costophrenic angles are clear. Stable cardio-mediastinal silhouette. Sternotomy wires noted, status post CABG. No acute osseous abnormalities. The soft tissues are within normal limits. IMPRESSION: *No acute cardiopulmonary abnormality.  Interstitial lung disease. Electronically Signed   By: Jules Schick M.D.   On: 01/01/2023 17:19   ECHOCARDIOGRAM COMPLETE Result Date: 12/07/2022    ECHOCARDIOGRAM REPORT   Patient Name:   TITAN KARNER Date of Exam: 12/07/2022 Medical Rec #:  [id number]         Height:       68.3 in Accession #:    [id number]        Weight:       221.0 lb Date of Birth:  March 21, 1945          BSA:          2.139 m Patient Age:    77 years          BP:           128/80 mmHg Patient Gender: M                 HR:           81 bpm. Exam Location:  [address] Procedure: 2D Echo, 3D Echo, Cardiac Doppler and Color Doppler Indications:    R06.00 Dyspnea  History:        Patient has prior history of Echocardiogram examinations, most                 recent 03/02/2022. CHF, CAD, Prior CABG, Arrythmias:Atrial                 Fibrillation, Signs/Symptoms:Dyspnea; Risk Factors:Hypertension,                 Dyslipidemia, Former Smoker, Family History of Coronary Artery                 Disease and Sleep Apnea. Interstitial Lung Disease, Wegener's                 Granulomatosis.  Sonographer:    Farrel Conners RDCS Referring Phys: Kalman Shan IMPRESSIONS  1. Left ventricular ejection fraction, by estimation, is 60 to 65%. The left ventricle has normal function. The left ventricle has no regional wall motion abnormalities. Left  ventricular diastolic parameters are consistent with Grade I diastolic dysfunction (impaired relaxation).  2. Mid systolic notching of the pulmonary artery pressure tracing consistent with pulmonary hypertension. RVSP estimated at . . Right ventricular systolic function is normal. The right ventricular size is mildly enlarged.  3.  Left atrial size was moderately dilated.  4. Right atrial size was severely dilated.  5. The mitral valve is normal in structure. Trivial mitral valve regurgitation. No evidence of mitral stenosis. Moderate mitral annular calcification.  6. The aortic valve is normal in structure. Aortic valve regurgitation is not visualized. No aortic stenosis is present.  7. Aortic dilatation noted. There is mild dilatation of the ascending aorta, measuring 41 mm.  8. The inferior vena cava is normal in size with greater than 50% respiratory variability, suggesting right atrial pressure of 3 mmHg. FINDINGS  Left Ventricle: Left ventricular ejection fraction, by estimation, is 60 to 65%. The left ventricle has normal function. The left ventricle has no regional wall motion abnormalities. The left ventricular internal cavity size was normal in size. There is  no left ventricular hypertrophy. Left ventricular diastolic parameters are consistent with Grade I diastolic dysfunction (impaired relaxation). Right Ventricle: Mid systolic notching of the pulmonary artery pressure tracing consistent with pulmonary hypertension. RVSP estimated at . The right ventricular size is mildly enlarged. No increase in right ventricular wall thickness. Right ventricular systolic function is normal. Left Atrium: Left atrial size was moderately dilated. Right Atrium: Right atrial size was severely dilated. Pericardium: There is no evidence of pericardial effusion. Mitral Valve: The mitral valve is normal in structure. Moderate mitral annular calcification. Trivial mitral valve regurgitation. No evidence of mitral valve stenosis. MV peak gradient, 5.7 mmHg. The mean mitral valve gradient is 2.0 mmHg. Tricuspid Valve: The tricuspid valve is normal in structure. Tricuspid valve regurgitation is not demonstrated. No evidence of tricuspid stenosis. Aortic Valve: The aortic valve is normal in structure. Aortic valve regurgitation is not visualized. No  aortic stenosis is present. Aortic valve mean gradient measures 7.0 mmHg. Aortic valve peak gradient measures 13.0 mmHg. Aortic valve area, by VTI measures 2.51 cm. Pulmonic Valve: The pulmonic valve was normal in structure. Pulmonic valve regurgitation is not visualized. No evidence of pulmonic stenosis. Aorta: Aortic dilatation noted and the aortic root is normal in size and structure. There is mild dilatation of the ascending aorta, measuring 41 mm. Venous: The inferior vena cava is normal in size with greater than 50% respiratory variability, suggesting right atrial pressure of 3 mmHg. IAS/Shunts: No atrial level shunt detected by color flow Doppler.  LEFT VENTRICLE PLAX 2D LVIDd:         5.40 cm   Diastology LVIDs:         3.60 cm   LV e' medial:    5.87 cm/s LV PW:         1.10 cm   LV E/e' medial:  16.0 LV IVS:        1.10 cm   LV e' lateral:   10.30 cm/s LVOT diam:     2.60 cm   LV E/e' lateral: 9.1 LV SV:         99 LV SV Index:   46 LVOT Area:     5.31 cm                           3D Volume EF:  3D EF:        60 %                          LV EDV:       187 ml                          LV ESV:       75 ml                          LV SV:        113 ml RIGHT VENTRICLE RV Basal diam:  4.50 cm RV Mid diam:    3.50 cm RV S prime:     16.10 cm/s TAPSE (M-mode): 2.3 cm RVSP:           59.2 mmHg LEFT ATRIUM              Index        RIGHT ATRIUM           Index LA diam:        5.30 cm  2.48 cm/m   RA Pressure: 3.00 mmHg LA Vol (A2C):   79.1 ml  36.99 ml/m  RA Area:     31.20 cm LA Vol (A4C):   112.0 ml 52.37 ml/m  RA Volume:   122.00 ml 57.05 ml/m LA Biplane Vol: 98.6 ml  46.11 ml/m  AORTIC VALVE AV Area (Vmax):    2.48 cm AV Area (Vmean):   2.44 cm AV Area (VTI):     2.51 cm AV Vmax:           180.00 cm/s AV Vmean:          121.000 cm/s AV VTI:            0.393 m AV Peak Grad:      13.0 mmHg AV Mean Grad:      7.0 mmHg LVOT Vmax:         84.10 cm/s LVOT Vmean:        55.550 cm/s  LVOT VTI:          0.186 m LVOT/AV VTI ratio: 0.47  AORTA Ao Root diam: 3.40 cm Ao Asc diam:  4.05 cm MITRAL VALVE               TRICUSPID VALVE MV Area (PHT): cm         TR Peak grad:   56.2 mmHg MV Area VTI:   2.87 cm    TR Vmax:        375.00 cm/s MV Peak grad:  5.7 mmHg    Estimated RAP:  3.00 mmHg MV Mean grad:  2.0 mmHg    RVSP:           59.2 mmHg MV Vmax:       1.19 m/s MV Vmean:      55.7 cm/s   SHUNTS MV Decel Time: 219 msec    Systemic VTI:  0.19 m MV E velocity: 94.10 cm/s  Systemic Diam: 2.60 cm MV A velocity: 67.15 cm/s MV E/A ratio:  1.40 Clearnce Hasten Electronically signed by Clearnce Hasten Signature Date/Time: 12/07/2022/4:32:10 PM    Final       Signature  -   Susa Raring M.D on 02/19/2023 at 9:34 AM   -  To page go to [email or web address]

## 2023-02-19 NOTE — Progress Notes (Addendum)
Patient stated " I dont want labs done anymore". and refused this morning blood draw. Patient also picked one Capsule of Tikosyn 125 mcg out of 4 capsules and stated " I will be taking one in the morning and one at night just enough to prevent a stroke". Remainder placed in [facility] pharmacy box.

## 2023-02-20 DIAGNOSIS — J9621 Acute and chronic respiratory failure with hypoxia: Secondary | ICD-10-CM | POA: Diagnosis not present

## 2023-02-20 LAB — GLUCOSE, CAPILLARY
Glucose-Capillary: 49 mg/dL — ABNORMAL LOW (ref 70–99)
Glucose-Capillary: 61 mg/dL — ABNORMAL LOW (ref 70–99)
Glucose-Capillary: 139 mg/dL — ABNORMAL HIGH (ref 70–99)
Glucose-Capillary: 161 mg/dL — ABNORMAL HIGH (ref 70–99)
Glucose-Capillary: 190 mg/dL — ABNORMAL HIGH (ref 70–99)

## 2023-02-20 MED ORDER — ONDANSETRON HCL 4 MG/2ML IJ SOLN: 4.0000 mg | Freq: Four times a day (QID) | INTRAMUSCULAR | Status: DC | PRN

## 2023-02-20 MED ORDER — METHYLPREDNISOLONE SODIUM SUCC 125 MG IJ SOLR
60.0000 mg | Freq: Four times a day (QID) | INTRAMUSCULAR | Status: DC
  Administered 2023-02-20: 60 mg via INTRAVENOUS
  Filled 2023-02-20: qty 2

## 2023-02-20 MED ORDER — GLYCOPYRROLATE 0.2 MG/ML IJ SOLN: 0.2000 mg | INTRAMUSCULAR | Status: DC | PRN

## 2023-02-20 MED ORDER — MORPHINE SULFATE (PF) 2 MG/ML IV SOLN: 1.0000 mg | INTRAVENOUS | Status: DC | PRN

## 2023-02-20 MED ORDER — INSULIN GLARGINE-YFGN 100 UNIT/ML ~~LOC~~ SOLN: 8.0000 [IU] | Freq: Every day | SUBCUTANEOUS | Status: DC

## 2023-02-20 MED ORDER — POLYVINYL ALCOHOL 1.4 % OP SOLN: 1.0000 [drp] | Freq: Four times a day (QID) | OPHTHALMIC | Status: DC | PRN

## 2023-02-20 MED ORDER — LORAZEPAM 2 MG/ML IJ SOLN: 2.0000 mg | INTRAMUSCULAR | Status: DC | PRN

## 2023-02-20 MED ORDER — GLYCOPYRROLATE 1 MG PO TABS: 1.0000 mg | ORAL_TABLET | ORAL | Status: DC | PRN

## 2023-02-20 MED ORDER — DIPHENHYDRAMINE HCL 50 MG/ML IJ SOLN: 25.0000 mg | INTRAMUSCULAR | Status: DC | PRN

## 2023-02-20 MED ORDER — HALOPERIDOL LACTATE 5 MG/ML IJ SOLN: 2.5000 mg | INTRAMUSCULAR | Status: DC | PRN

## 2023-02-20 MED ORDER — ONDANSETRON 4 MG PO TBDP: 4.0000 mg | ORAL_TABLET | Freq: Four times a day (QID) | ORAL | Status: DC | PRN

## 2023-02-20 NOTE — Progress Notes (Signed)
PROGRESS NOTE        PATIENT DETAILS Name: Gary [name] Age: 78 y.o. Sex: male Date of Birth: [birth year] Admit Date: 02/03/2023 Admitting Physician Leroy Sea, MD ZOX:WRUEAV, [name], DO  Brief Summary: Patient is a 78 y.o.  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 2/06>> Echo:EF 60-65%, agitated saline contrast bubble study negative-normal evidence of interatrial shunt.  Significant microbiology data: 1/30>> respiratory virus panel: Negative 1/30>> COVID/influenza/RSV PCR: Negative  Procedures: None  Consults: PCCM, palliative care  Subjective: Patient in recliner, denies any headache or chest pain, continues to have shortness of breath, does not want to get any more blood draws and wants to go home with hospice.  Objective: Vitals: Blood pressure 138/73, pulse 81, temperature 97.9 F (36.6 C), temperature source Oral, resp. rate 19, height 5\' 10"  (1.778 m), weight 86.7 kg, SpO2 97%.   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 Although hypoxia slowly improving-he continues to have severe exertional dyspnea with minimal activity with significant drops in saturation-with slow but gradual recovery with rest.  He seems a bit more fatigued/tired today. Remains on CellCept ( dose doubled on 02/14/23) initially on IV Solu-Medrol thereafter switch to 60 mg of prednisone, since he declined he was switched back to IV Solu-Medrol on 02/18/2023, received Cytoxan infusion on 02/16/2023, initially improved but now worse again on 02/18/2023.  Leukocyte count has gotten worse, repeat chest x-ray, repeat a course of Rocephin and doxycycline, IV Lasix on 02/18/2023, replaced on IV Solu-Medrol however no significant improvement.  PCCM continues to follow, unfortunately he has declined again on 02/18/2023, patient now is saying that he does not want any further blood work or workup, he wants to pursue comfort measures and hospice, palliative care will be involved, I had already discussed this patient's wife on 02/18/2023 and she is open for hospice if patient is agreeable.  Social work to start arranging for home hospice.  Await palliative care input, patient again confirmed on 02/20/2023 that he wants to go home with home hospice.  SpO2: 97 % O2 Flow Rate (L/min): 9 L/min FiO2 (%): 40 %    History of granulomatosis with polyangiitis (GPA) Was on tapering steroids (down to 40 mg)-when he developed worsening respiratory failure Prophylactic dapsone for PJP (no Bactrim as interaction with Tikosyn) PCCM started CellCept-no longer on Rituxan Currently on IV Solu-Medrol-dose reduced from twice daily to daily on 2/7.  Paroxysmal atrial fibrillation Maintaining sinus rhythm Tikosyn/Coreg Has been transitioned to Eliquis-no plans to restart Coumadin on discharge.  Chronic HFpEF Euvolemic-on Lasix to ensure negative balance.  History of CAD s/p PCI September 2023 Aspirin No longer on Plavix-this was also not on his most  recent discharge list of medications.  Iron deficiency anemia Appreciate GI input-plans are for outpatient endoscopy evaluation once his respiratory status improves-No overt bleeding noted-some drop in Hb but no obvious evidence of GI bleed.  Given IV iron in 2/4-follow CBC  Hypothyroidism Synthroid  Mood disorder Stable Continue Effexor   DM-2 with steroid related hyperglycemia CBGs continue to fluctuate-elevated yesterday-borderline hypoglycemic today Decrease Semglee to 10 units Continue SSI Follow-up with   Recent Labs    02/20/23 0749 02/20/23 0818 02/20/23 0858  GLUCAP 61* 49* 139*     BMI: Estimated body mass index is 27.43 kg/m as calculated from the following:   Height as of this encounter: 5\' 10"  (1.778 m).   Weight as of this encounter: 86.7 kg.   Code status:   Code Status: Full Code   DVT Prophylaxis: SCDs Start: 02/03/23 1523 apixaban (ELIQUIS) tablet 5 mg    Family Communication:Spouse [name] on 02/18/2023, called and left message on 02/20/2023 at 10:06 AM   Disposition Plan: Status is: Inpatient Remains inpatient appropriate because: Severity of illness   Planned Discharge Destination:Home health   Diet: Diet Order             Diet regular Fluid consistency: Thin  Diet effective now                     Antimicrobial agents: Anti-infectives (From admission, onward)    Start     Dose/Rate Route Frequency Ordered Stop   02/18/23 1230  doxycycline (VIBRA-TABS) tablet 100 mg        100 mg Oral Every 12 hours 02/18/23 1133     02/18/23 1230  cefTRIAXone (ROCEPHIN) 1 g in sodium chloride 0.9 % 100 mL IVPB        1 g 200 mL/hr over 30 Minutes Intravenous Every 24 hours 02/18/23 1133 02/23/23 1229   02/03/23 1445  dapsone tablet 100 mg        100 mg Oral Daily 02/03/23 1442          MEDICATIONS: Scheduled Meds:  ALPRAZolam  0.5 mg Oral QHS   apixaban  5 mg Oral BID   aspirin  81 mg Oral Daily   carvedilol  3.125 mg  Oral BID WC   dapsone  100 mg Oral Daily   dofetilide  500 mcg Oral BID   doxycycline  100 mg Oral Q12H   dronabinol  5 mg Oral QAC lunch   feeding supplement (GLUCERNA SHAKE)  237 mL Oral BID BM   insulin aspart  0-20 Units Subcutaneous TID WC   insulin aspart  0-5 Units Subcutaneous QHS   insulin aspart  4 Units Subcutaneous TID WC   insulin glargine-yfgn  12 Units Subcutaneous Daily   levothyroxine  137 mcg Oral Q0600   methylPREDNISolone (SOLU-MEDROL) injection  60 mg Intravenous Q6H   mycophenolate  1,000 mg Oral BID   pantoprazole  40 mg Oral BID   venlafaxine XR  150 mg Oral Daily   Continuous Infusions:  cefTRIAXone (ROCEPHIN)  IV Stopped (02/19/23 1523)     PRN Meds:.acetaminophen **OR** acetaminophen, bisacodyl, chlorpheniramine-HYDROcodone, metoprolol tartrate, nitroGLYCERIN, mouth rinse   I have personally reviewed following labs and imaging studies  LABORATORY DATA:   Data Review:    Recent Labs  Lab 02/15/23 0501 02/15/23 1857 02/16/23  4540 02/17/23 0455 02/18/23 0842 02/19/23 0529  WBC 8.8 8.5 8.0 8.5 14.6* 7.7  HGB 8.7* 8.0* 8.3* 8.0* 9.3* 7.8*  HCT 30.2* 26.6* 28.4* 27.5* 32.6* 26.5*  PLT 395 335 367 394 555* 386  MCV 89.3 89.3 89.3 90.5 91.3 88.9  MCH 25.7* 26.8 26.1 26.3 26.1 26.2  MCHC 28.8* 30.1 29.2* 29.1* 28.5* 29.4*  RDW 25.2* 25.2* 24.7* 24.0* 24.2* 23.8*  LYMPHSABS 0.6*  --  0.4* 0.4* 0.4* 0.1*  MONOABS 0.4  --  0.3 0.4 0.2 0.1  EOSABS 0.0  --  0.0 0.0 0.0 0.0  BASOSABS 0.0  --  0.0 0.0 0.0 0.0    Recent Labs  Lab 02/14/23 0514 02/15/23 0501 02/15/23 1857 02/16/23 0456 02/17/23 0455 02/18/23 0559 02/19/23 0529  NA 142 140 137 137 140 139 138  K 4.2 4.5 3.9 3.8 4.6 4.1 4.0  CL 92* 99 97* 96* 97* 98 97*  CO2 33* 29 29 30 31 28 29   ANIONGAP 17* 12 11 11 12 13 12   GLUCOSE 96 143* 126* 101* 139* 67* 135*  BUN 29* 36* 34* 32* 23 27* 28*  CREATININE 0.80 0.98 0.83 0.78 0.87 0.83 0.75  AST  --   --  33  --   --   --   --   ALT   --   --  59*  --   --   --   --   ALKPHOS  --   --  170*  --   --   --   --   BILITOT  --   --  0.5  --   --   --   --   ALBUMIN  --   --  2.6*  --   --   --   --   CRP 12.7* 15.8*  --  12.0*  --   --   --   PROCALCITON 0.36 0.46  --  0.20 0.19  --  0.42  BNP 155.0* 174.3*  --  106.4*  --   --  243.6*  MG 2.4 2.3  --  2.3  --  2.2 2.3  PHOS 3.2 3.3  --  3.6  --   --   --   CALCIUM 10.2 9.4 9.0 9.0 9.4 8.9 8.8*      Recent Labs  Lab 02/14/23 0514 02/15/23 0501 02/15/23 1857 02/16/23 0456 02/17/23 0455 02/18/23 0559 02/19/23 0529  CRP 12.7* 15.8*  --  12.0*  --   --   --   PROCALCITON 0.36 0.46  --  0.20 0.19  --  0.42  BNP 155.0* 174.3*  --  106.4*  --   --  243.6*  MG 2.4 2.3  --  2.3  --  2.2 2.3  CALCIUM 10.2 9.4 9.0 9.0 9.4 8.9 8.8*    --------------------------------------------------------------------------------------------------------------- Lab Results  Component Value Date   CHOL 155 05/26/2020   HDL 45 05/26/2020   LDLCALC 79 05/26/2020   TRIG 184 (H) 05/26/2020   CHOLHDL 3.4 05/26/2020    Lab Results  Component Value Date   HGBA1C 5.9 (H) 01/02/2023   Radiology Reports DG Chest Port 1 View Result Date: 02/18/2023 CLINICAL DATA:  Shortness of breath. EXAM: PORTABLE CHEST 1 VIEW COMPARISON:  02/12/2023, CT 01/07/2023 FINDINGS: Prior median sternotomy. Stable heart size and mediastinal contours. Chronic interstitial lung disease with diffuse reticular opacities. No superimposed acute airspace disease. No pleural fluid or pneumothorax. IMPRESSION: Chronic interstitial lung disease. No superimposed acute finding.  Electronically Signed   By: Narda Rutherford M.D.   On: 02/18/2023 15:47   DG Chest Port 1 View Result Date: 02/12/2023 CLINICAL DATA:  Interstitial lung disease EXAM: PORTABLE CHEST 1 VIEW COMPARISON:  02/09/2023 FINDINGS: Single frontal view of the chest demonstrates postsurgical changes from CABG. Cardiac silhouette is enlarged but stable. Diffuse  interstitial lung disease is again identified, with bilateral fibrotic changes and scattered ground-glass opacities most pronounced in the right upper and left lower lung zones similar to prior CT exam. No new consolidation, effusion, or pneumothorax. No acute bony abnormalities. IMPRESSION: 1. Stable findings of chronic interstitial lung disease, with fibrosis and ground-glass opacities unchanged since recent high-resolution chest CT 01/07/2023. 2. No evidence of acute intrathoracic process. Electronically Signed   By: Sharlet Salina M.D.   On: 02/12/2023 15:32   ECHOCARDIOGRAM LIMITED BUBBLE STUDY Result Date: 02/10/2023    ECHOCARDIOGRAM LIMITED REPORT   Patient Name:   MICKLE CAMPTON Date of Exam: 02/10/2023 Medical Rec #:  [id number]         Height:       70.0 in Accession #:    [id number]        Weight:       201.7 lb Date of Birth:  1945-10-02          BSA:          2.095 m Patient Age:    77 years          BP:           149/81 mmHg Patient Gender: M                 HR:           75 bpm. Exam Location:  Inpatient Procedure: Limited Echo, Cardiac Doppler, Color Doppler and Saline Contrast            Bubble Study Indications:    ASD  History:        Patient has prior history of Echocardiogram examinations, most                 recent 12/07/2022. CAD; Risk Factors:Hypertension, Diabetes,                 Dyslipidemia, Former Smoker and Sleep Apnea.  Sonographer:    Karma Ganja Referring Phys: [id number] Vinnie Level SMITH IMPRESSIONS  1. Left ventricular ejection fraction, by estimation, is 60 to 65%. The left ventricle has normal function. The left ventricle has no regional wall motion abnormalities. There is moderate concentric left ventricular hypertrophy. Left ventricular diastolic function could not be evaluated.  2. Right ventricular systolic function is normal. The right ventricular size is normal. There is moderately elevated pulmonary artery systolic pressure.  3. The mitral valve is degenerative. Mild mitral  valve regurgitation. No evidence of mitral stenosis. Moderate mitral annular calcification.  4. The aortic valve is calcified. There is mild calcification of the aortic valve. Aortic valve regurgitation is not visualized. Aortic valve sclerosis/calcification is present, without any evidence of aortic stenosis.  5. The inferior vena cava is normal in size with greater than 50% respiratory variability, suggesting right atrial pressure of 3 mmHg.  6. Agitated saline contrast bubble study was negative, with no evidence of any interatrial shunt. FINDINGS  Left Ventricle: Left ventricular ejection fraction, by estimation, is 60 to 65%. The left ventricle has normal function. The left ventricle has no regional wall motion abnormalities. The left ventricular internal cavity size was normal in  size. There is  moderate concentric left ventricular hypertrophy. Left ventricular diastolic function could not be evaluated. Left ventricular diastolic function could not be evaluated due to mitral annular calcification (moderate or greater). Right Ventricle: The right ventricular size is normal. No increase in right ventricular wall thickness. Right ventricular systolic function is normal. There is moderately elevated pulmonary artery systolic pressure. The tricuspid regurgitant velocity is 3.58 m/s, and with an assumed right atrial pressure of 3 mmHg, the estimated right ventricular systolic pressure is 54.3 mmHg. Left Atrium: Left atrial size was not assessed. Right Atrium: Right atrial size was not assessed. Pericardium: There is no evidence of pericardial effusion. Mitral Valve: The mitral valve is degenerative in appearance. Moderate mitral annular calcification. Mild mitral valve regurgitation. No evidence of mitral valve stenosis. Tricuspid Valve: The tricuspid valve is normal in structure. Tricuspid valve regurgitation is mild . No evidence of tricuspid stenosis. Aortic Valve: The aortic valve is calcified. There is mild  calcification of the aortic valve. Aortic valve regurgitation is not visualized. Aortic valve sclerosis/calcification is present, without any evidence of aortic stenosis. Pulmonic Valve: The pulmonic valve was not well visualized. Pulmonic valve regurgitation is not visualized. No evidence of pulmonic stenosis. Aorta: Aortic root could not be assessed. Venous: The inferior vena cava is normal in size with greater than 50% respiratory variability, suggesting right atrial pressure of 3 mmHg. IAS/Shunts: No atrial level shunt detected by color flow Doppler. Agitated saline contrast was given intravenously to evaluate for intracardiac shunting. Agitated saline contrast bubble study was negative, with no evidence of any interatrial shunt. Additional Comments: Spectral Doppler performed. Color Doppler performed.  TRICUSPID VALVE TR Peak grad:   51.3 mmHg TR Vmax:        358.00 cm/s Lavona Mound Tobb DO Electronically signed by Thomasene Ripple DO Signature Date/Time: 02/10/2023/12:47:16 PM    Final    DG Chest Port 1 View Result Date: 02/09/2023 CLINICAL DATA:  Respiratory failure. EXAM: PORTABLE CHEST 1 VIEW COMPARISON:  02/03/2023 FINDINGS: Low volume film. The cardio pericardial silhouette is enlarged. Diffuse interstitial opacity again noted, progressive in the parahilar left mid lung. No substantial pleural effusion. No acute bony abnormality. Telemetry leads overlie the chest. IMPRESSION: Low volume film with progressive interstitial opacity in the parahilar left mid lung. Imaging features could be related to asymmetric edema or infection. Electronically Signed   By: Kennith Center M.D.   On: 02/09/2023 08:54   DG Chest Portable 1 View Result Date: 02/03/2023 CLINICAL DATA:  Shortness of breath. Hypoxia. History of Wegener's and interstitial lung disease. EXAM: PORTABLE CHEST 1 VIEW COMPARISON:  Radiographs 01/05/2023 and 01/01/2023.  CT 01/07/2023. FINDINGS: 1252 hours. The heart size and mediastinal contours are stable  status post median sternotomy and CABG. Chronic interstitial lung disease again noted with possible slight worsening on the right which could indicate superimposed edema or inflammation. There is no consolidation, pneumothorax or significant pleural effusion. The bones appear unchanged. IMPRESSION: Chronic interstitial lung disease with possible slight worsening on the right which could indicate superimposed edema or inflammation. Electronically Signed   By: Carey Bullocks M.D.   On: 02/03/2023 14:01   CT Chest High Resolution Result Date: 01/07/2023 CLINICAL DATA:  Inpatient. Diffuse/interstitial lung disease ild flare EXAM: CT CHEST WITHOUT CONTRAST TECHNIQUE: Multidetector CT imaging of the chest was performed following the standard protocol without intravenous contrast. High resolution imaging of the lungs, as well as inspiratory and expiratory imaging, was performed. RADIATION DOSE REDUCTION: This exam was performed according to  the departmental dose-optimization program which includes automated exposure control, adjustment of the mA and/or kV according to patient size and/or use of iterative reconstruction technique. COMPARISON:  01/05/2023 chest radiograph. 11/05/2022 high-resolution chest CT. FINDINGS: Cardiovascular: Stable mild cardiomegaly. No significant pericardial effusion/thickening. Three-vessel coronary atherosclerosis status post CABG. Atherosclerotic nonaneurysmal thoracic aorta. Normal caliber pulmonary arteries. Mediastinum/Nodes: No significant thyroid nodules. Unremarkable esophagus. No axillary adenopathy. Mild right paratracheal adenopathy up to 1.2 cm (series 5/image 45), unchanged. No new pathologically enlarged mediastinal nodes. No discrete hilar adenopathy on these noncontrast images. Lungs/Pleura: No pneumothorax. No pleural effusion. No acute consolidative airspace disease, lung masses or significant pulmonary nodules. Stable mild patchy air trapping in both lungs on the expiration  sequence, without evidence of tracheobronchomalacia. Extensive patchy peribronchovascular and subpleural reticulation and ground-glass opacity throughout both lungs with associated mild traction bronchiectasis and architectural distortion. No clear apicobasilar gradient to these findings. Scattered foci of mild honeycombing in both lungs, for example in the peripheral right lung on series 8/image 80 and in the peripheral lower left lung on series 8/image 98). Ground-glass opacities have worsened since 11/05/2022 chest CT. Findings are otherwise not appreciably changed. Upper abdomen: Cholelithiasis. Musculoskeletal: No aggressive appearing focal osseous lesions. Intact sternotomy wires. Moderate thoracic spondylosis. IMPRESSION: 1. Spectrum of findings compatible with fibrotic interstitial lung disease with mild honeycombing, with worsened diffuse patchy ground-glass opacity in the interval since 11/05/2022 CT. Acute flare of chronic fibrotic interstitial lung disease favored. As previously described, chronic hypersensitivity pneumonitis or fibrotic NSIP are favored. Findings are suggestive of an alternative diagnosis (not UIP) per consensus guidelines: Diagnosis of Idiopathic Pulmonary Fibrosis: An Official ATS/ERS/JRS/ALAT Clinical Practice Guideline. Am Rosezetta Schlatter Crit Care Med Vol 198, Iss 5, [id number], Sep 04 2016. 2. Stable mild cardiomegaly. 3. Cholelithiasis. 4.  Aortic Atherosclerosis (ICD10-I70.0). Electronically Signed   By: Delbert Phenix M.D.   On: 01/07/2023 10:28   CARDIAC CATHETERIZATION Result Date: 01/06/2023 1. Normal filling pressures. 2. Mild pulmonary hypertension without elevated PVR. 3. Normal cardiac output.   VAS [place] LOWER EXTREMITY VENOUS (DVT) Result Date: 01/05/2023  Lower Venous DVT Study Patient Name:  Gary [name] [facility]  Date of Exam:   01/05/2023 Medical Rec #: [id number]          Accession #:    [id number] Date of Birth: [birth year]           Patient Gender: M Patient Age:   59 years Exam  Location:  [facility] Procedure:      VAS [place] LOWER EXTREMITY VENOUS (DVT) Referring Phys: Levon Hedger --------------------------------------------------------------------------------  Indications: Edema.  Anticoagulation: Coumadin. Limitations: Poor ultrasound/tissue interface. Performing Technologist: Ernestene Mention RVT, RDMS  Examination Guidelines: A complete evaluation includes B-mode imaging, spectral Doppler, color Doppler, and power Doppler as needed of all accessible portions of each vessel. Bilateral testing is considered an integral part of a complete examination. Limited examinations for reoccurring indications may be performed as noted. The reflux portion of the exam is performed with the patient in reverse Trendelenburg.  +--------+---------------+---------+-----------+----------+--------------------+ RIGHT   CompressibilityPhasicitySpontaneityPropertiesThrombus Aging       +--------+---------------+---------+-----------+----------+--------------------+ CFV     Full           Yes      Yes                                       +--------+---------------+---------+-----------+----------+--------------------+ SFJ     Full                                                              +--------+---------------+---------+-----------+----------+--------------------+  FV Prox Full           Yes      Yes                                       +--------+---------------+---------+-----------+----------+--------------------+ FV Mid  Full           Yes      Yes                                       +--------+---------------+---------+-----------+----------+--------------------+ FV                     Yes      Yes                  patent by            Distal                                               color/doppler        +--------+---------------+---------+-----------+----------+--------------------+ PFV     Full                                                               +--------+---------------+---------+-----------+----------+--------------------+ POP     Full           Yes      Yes                                       +--------+---------------+---------+-----------+----------+--------------------+ PTV     Full                                                              +--------+---------------+---------+-----------+----------+--------------------+ PERO    Full                                                              +--------+---------------+---------+-----------+----------+--------------------+   +--------+---------------+---------+-----------+----------+--------------------+ LEFT    CompressibilityPhasicitySpontaneityPropertiesThrombus Aging       +--------+---------------+---------+-----------+----------+--------------------+ CFV     Full           Yes      Yes                                       +--------+---------------+---------+-----------+----------+--------------------+ SFJ     Full                                                              +--------+---------------+---------+-----------+----------+--------------------+  FV Prox Full           Yes      Yes                                       +--------+---------------+---------+-----------+----------+--------------------+ FV Mid  Full           Yes      Yes                                       +--------+---------------+---------+-----------+----------+--------------------+ FV                     Yes      Yes                  patent by            Distal                                               color/doppler        +--------+---------------+---------+-----------+----------+--------------------+ PFV     Full                                                              +--------+---------------+---------+-----------+----------+--------------------+ POP     Full           Yes      Yes                                        +--------+---------------+---------+-----------+----------+--------------------+ PTV     Full                                                              +--------+---------------+---------+-----------+----------+--------------------+ PERO    Full                                                              +--------+---------------+---------+-----------+----------+--------------------+     Summary: BILATERAL: - No evidence of deep vein thrombosis seen in the lower extremities, bilaterally. -No evidence of popliteal cyst, bilaterally.   *See table(s) above for measurements and observations. Electronically signed by Lemar Livings MD on 01/05/2023 at 5:34:57 PM.    Final    DG Chest Port 1 View Result Date: 01/05/2023 CLINICAL DATA:  Acute respiratory failure. History of hypertension and coronary artery disease. EXAM: PORTABLE CHEST 1 VIEW COMPARISON:  01/01/2023 FINDINGS: Previous median sternotomy and CABG procedure. Lung volumes are low. Diffuse increased reticular and nodular interstitial opacities within both  lungs compatible with previously characterized chronic fibrotic no superimposed pleural effusion, interstitial edema or airspace consolidation. Interstitial lung disease IMPRESSION: 1. No acute findings. 2. Chronic interstitial lung disease. Electronically Signed   By: Signa Kell M.D.   On: 01/05/2023 12:24   DG Chest Port 1 View Result Date: 01/01/2023 CLINICAL DATA:  Shortness of breath. EXAM: PORTABLE CHEST 1 VIEW COMPARISON:  09/17/2022. FINDINGS: Low lung volume. There are diffuse increased interstitial markings, less pronounced than the prior radiograph from 10/04/2022, which corresponds to chronic interstitial lung disease on recent chest CT scan from 11/05/2022. Bilateral lung fields are otherwise clear. No acute consolidation or lung collapse. Bilateral costophrenic angles are clear. Stable cardio-mediastinal silhouette. Sternotomy wires noted, status post CABG. No  acute osseous abnormalities. The soft tissues are within normal limits. IMPRESSION: *No acute cardiopulmonary abnormality.  Interstitial lung disease. Electronically Signed   By: Jules Schick M.D.   On: 01/01/2023 17:19   ECHOCARDIOGRAM COMPLETE Result Date: 12/07/2022    ECHOCARDIOGRAM REPORT   Patient Name:   Gary [name] Date of Exam: 12/07/2022 Medical Rec #:  [id number]         Height:       68.3 in Accession #:    [id number]        Weight:       221.0 lb Date of Birth:  [birth year]          BSA:          2.139 m Patient Age:    77 years          BP:           128/80 mmHg Patient Gender: M                 HR:           81 bpm. Exam Location:  [address] Procedure: 2D Echo, 3D Echo, Cardiac Doppler and Color Doppler Indications:    R06.00 Dyspnea  History:        Patient has prior history of Echocardiogram examinations, most                 recent 03/02/2022. CHF, CAD, Prior CABG, Arrythmias:Atrial                 Fibrillation, Signs/Symptoms:Dyspnea; Risk Factors:Hypertension,                 Dyslipidemia, Former Smoker, Family History of Coronary Artery                 Disease and Sleep Apnea. Interstitial Lung Disease, Wegener's                 Granulomatosis.  Sonographer:    Farrel Conners RDCS Referring Phys: Kalman Shan IMPRESSIONS  1. Left ventricular ejection fraction, by estimation, is 60 to 65%. The left ventricle has normal function. The left ventricle has no regional wall motion abnormalities. Left ventricular diastolic parameters are consistent with Grade I diastolic dysfunction (impaired relaxation).  2. Mid systolic notching of the pulmonary artery pressure tracing consistent with pulmonary hypertension. RVSP estimated at . . Right ventricular systolic function is normal. The right ventricular size is mildly enlarged.  3. Left atrial size was moderately dilated.  4. Right atrial size was severely dilated.  5. The mitral valve is normal in structure. Trivial mitral valve  regurgitation. No evidence of mitral stenosis. Moderate mitral annular calcification.  6. The aortic valve is normal in structure. Aortic valve regurgitation is  not visualized. No aortic stenosis is present.  7. Aortic dilatation noted. There is mild dilatation of the ascending aorta, measuring 41 mm.  8. The inferior vena cava is normal in size with greater than 50% respiratory variability, suggesting right atrial pressure of 3 mmHg. FINDINGS  Left Ventricle: Left ventricular ejection fraction, by estimation, is 60 to 65%. The left ventricle has normal function. The left ventricle has no regional wall motion abnormalities. The left ventricular internal cavity size was normal in size. There is  no left ventricular hypertrophy. Left ventricular diastolic parameters are consistent with Grade I diastolic dysfunction (impaired relaxation). Right Ventricle: Mid systolic notching of the pulmonary artery pressure tracing consistent with pulmonary hypertension. RVSP estimated at . The right ventricular size is mildly enlarged. No increase in right ventricular wall thickness. Right ventricular systolic function is normal. Left Atrium: Left atrial size was moderately dilated. Right Atrium: Right atrial size was severely dilated. Pericardium: There is no evidence of pericardial effusion. Mitral Valve: The mitral valve is normal in structure. Moderate mitral annular calcification. Trivial mitral valve regurgitation. No evidence of mitral valve stenosis. MV peak gradient, 5.7 mmHg. The mean mitral valve gradient is 2.0 mmHg. Tricuspid Valve: The tricuspid valve is normal in structure. Tricuspid valve regurgitation is not demonstrated. No evidence of tricuspid stenosis. Aortic Valve: The aortic valve is normal in structure. Aortic valve regurgitation is not visualized. No aortic stenosis is present. Aortic valve mean gradient measures 7.0 mmHg. Aortic valve peak gradient measures 13.0 mmHg. Aortic valve area, by VTI  measures 2.51 cm. Pulmonic Valve: The pulmonic valve was normal in structure. Pulmonic valve regurgitation is not visualized. No evidence of pulmonic stenosis. Aorta: Aortic dilatation noted and the aortic root is normal in size and structure. There is mild dilatation of the ascending aorta, measuring 41 mm. Venous: The inferior vena cava is normal in size with greater than 50% respiratory variability, suggesting right atrial pressure of 3 mmHg. IAS/Shunts: No atrial level shunt detected by color flow Doppler.  LEFT VENTRICLE PLAX 2D LVIDd:         5.40 cm   Diastology LVIDs:         3.60 cm   LV e' medial:    5.87 cm/s LV PW:         1.10 cm   LV E/e' medial:  16.0 LV IVS:        1.10 cm   LV e' lateral:   10.30 cm/s LVOT diam:     2.60 cm   LV E/e' lateral: 9.1 LV SV:         99 LV SV Index:   46 LVOT Area:     5.31 cm                           3D Volume EF:                          3D EF:        60 %                          LV EDV:       187 ml                          LV ESV:       75 ml  LV SV:        113 ml RIGHT VENTRICLE RV Basal diam:  4.50 cm RV Mid diam:    3.50 cm RV S prime:     16.10 cm/s TAPSE (M-mode): 2.3 cm RVSP:           59.2 mmHg LEFT ATRIUM              Index        RIGHT ATRIUM           Index LA diam:        5.30 cm  2.48 cm/m   RA Pressure: 3.00 mmHg LA Vol (A2C):   79.1 ml  36.99 ml/m  RA Area:     31.20 cm LA Vol (A4C):   112.0 ml 52.37 ml/m  RA Volume:   122.00 ml 57.05 ml/m LA Biplane Vol: 98.6 ml  46.11 ml/m  AORTIC VALVE AV Area (Vmax):    2.48 cm AV Area (Vmean):   2.44 cm AV Area (VTI):     2.51 cm AV Vmax:           180.00 cm/s AV Vmean:          121.000 cm/s AV VTI:            0.393 m AV Peak Grad:      13.0 mmHg AV Mean Grad:      7.0 mmHg LVOT Vmax:         84.10 cm/s LVOT Vmean:        55.550 cm/s LVOT VTI:          0.186 m LVOT/AV VTI ratio: 0.47  AORTA Ao Root diam: 3.40 cm Ao Asc diam:  4.05 cm MITRAL VALVE               TRICUSPID VALVE MV  Area (PHT): cm         TR Peak grad:   56.2 mmHg MV Area VTI:   2.87 cm    TR Vmax:        375.00 cm/s MV Peak grad:  5.7 mmHg    Estimated RAP:  3.00 mmHg MV Mean grad:  2.0 mmHg    RVSP:           59.2 mmHg MV Vmax:       1.19 m/s MV Vmean:      55.7 cm/s   SHUNTS MV Decel Time: 219 msec    Systemic VTI:  0.19 m MV E velocity: 94.10 cm/s  Systemic Diam: 2.60 cm MV A velocity: 67.15 cm/s MV E/A ratio:  1.40 Clearnce Hasten Electronically signed by Clearnce Hasten Signature Date/Time: 12/07/2022/4:32:10 PM    Final       Signature  -   Susa Raring M.D on 02/20/2023 at 10:07 AM   -  To page go to [email or web address]

## 2023-02-20 NOTE — Progress Notes (Signed)
Mobility Specialist Progress Note:    02/20/23 1354  Mobility  Activity Repositioned in chair  Level of Assistance Moderate assist, patient does 50-74% (+2)  Assistive Device Other (Comment) (bed pads)  Activity Response Tolerated fair  Mobility Referral Yes  Mobility visit 1 Mobility  Mobility Specialist Start Time (ACUTE ONLY) 1009  Mobility Specialist Stop Time (ACUTE ONLY) 1021  Mobility Specialist Time Calculation (min) (ACUTE ONLY) 12 min   Pt received in chair declining mobility today. States that he is too weak and SOB, SPO2 was 95% and pt was on 10L/min. Pt agreeable to being fixed in chair d/t being very slouched in it. Pt attempted to get situated independently but was unable to move much. MS and NT helped pull pt up in chair. Call bell and personal belongings in reach. All needs met.  Thompson Grayer Mobility Specialist  Please contact vis Secure Chat or  Rehab Office [phone]

## 2023-02-20 NOTE — Progress Notes (Signed)
[name] Liaison Note   Received request for family interest in hospice inpatient unit. Visited patient and spoke with wife at bedside to discuss services and hospice philosophy of care.    At this time patient does not meet criteria for hospice inpatient unit. Notified wife as well as care team.    He is appropriate for hospice services in the home or LTC facility and we would be happy to reassess for inpatient unit appropriateness at a later time if requested.    Thank you for the opportunity to participate in this patient's care.    Gary [name], BSN, RN Hospice hospital liaison [phone]

## 2023-02-20 NOTE — Plan of Care (Signed)

## 2023-02-20 NOTE — Plan of Care (Signed)
   Problem: Education: Goal: Ability to describe self-care measures that may prevent or decrease complications (Diabetes Survival Skills Education) will improve Outcome: Progressing

## 2023-02-20 NOTE — Progress Notes (Signed)
Brief PCCM Progress Note   Mr. Knieriem asked to speak with me regarding hospice and comfort care. He relayed to me that he has been denied inpatient hospice and wanted to talk about alternate options. We discussed the possibility of transitioning to home hospice vs inpatient hospice and he has elected for inpatient.    He would like to continue most oral medication through today with plans to stop all medications not focused on comfort tomorrow morning. I have placed stop dates on most oral medications to stop tonight. He does not want any more lab work or CBG checks.   He also requested to be transferred to our palliative care floor on the sixth floor of the [employer] [employer].   I spoke with the Palliative care team and relayed his wishes, tentative plan for formal consult tomorrow. I also updated primary team regarding transition of care.   Amberleigh Gerken D. [name], NP-C [employer] [facility] Personal contact information can be found on Amion  If no contact or response made please call [phone] 02/20/2023, 5:28 PM

## 2023-02-20 NOTE — TOC Progression Note (Addendum)
Transition of Care [facility]) - Progression Note    Patient Details  Name: Gary [name] MRN: [id number] Date of Birth: Apr 19, 1945  Transition of Care [facility]) CM/SW Contact  Lawerance Sabal, RN Phone Number: 02/20/2023, 11:33 AM  Clinical Narrative:     Sherron Monday w patient at bedside. He states that he wants to go to [facility]. MD, CSW and ACC liaison Joni Reining notified   14:20 update from [facility] in secure chat that patient is not eligible for [facility]. Wife is not agreeable to home hospice. MD present in chat.       Expected Discharge Plan and Services       Living arrangements for the past 2 months: Single Family Home                                       Social Determinants of Health (SDOH) Interventions SDOH Screenings   Food Insecurity: No Food Insecurity (02/04/2023)  Housing: Low Risk  (02/04/2023)  Transportation Needs: No Transportation Needs (02/04/2023)  Utilities: Not At Risk (02/04/2023)  Social Connections: Moderately Isolated (02/04/2023)  Tobacco Use: Medium Risk (02/03/2023)    Readmission Risk Interventions    03/02/2022    4:16 PM  Readmission Risk Prevention Plan  Transportation Screening Complete  HRI or Home Care Consult Complete  Palliative Care Screening Not Applicable  Medication Review (RN Care Manager) Complete

## 2023-02-21 DIAGNOSIS — Z515 Encounter for palliative care: Secondary | ICD-10-CM | POA: Diagnosis not present

## 2023-02-21 DIAGNOSIS — J9621 Acute and chronic respiratory failure with hypoxia: Secondary | ICD-10-CM | POA: Diagnosis not present

## 2023-02-21 DIAGNOSIS — Z7189 Other specified counseling: Secondary | ICD-10-CM | POA: Diagnosis not present

## 2023-02-21 MED ORDER — MORPHINE SULFATE (PF) 2 MG/ML IV SOLN
1.0000 mg | Freq: Four times a day (QID) | INTRAVENOUS | Status: DC
  Administered 2023-02-21: 1 mg via INTRAVENOUS
  Filled 2023-02-21: qty 1

## 2023-02-21 MED ORDER — POLYETHYLENE GLYCOL 3350 17 G PO PACK
17.0000 g | PACK | Freq: Every day | ORAL | Status: DC
  Filled 2023-02-21: qty 1

## 2023-02-21 NOTE — Progress Notes (Addendum)
       Overnight   NAME: Gary [name] MRN: [id number] DOB : 08/22/1945  (RN notification to remote coverage)  Date of Service   02/21/2023   HPI/Events of Note    Notified by RN for system qualified "fall". RN was assisting patient to bed from bedside commode, when patient became unsteady. RN assisted patient to the floor under her power and then enlisted help to put patient back in bed. Patient was not unrestrained in descent and he did not hit his head or any other part of his body. Patient has no complaints otherwise.  78 year old male with a history of gastric granulomatosis polyangiitis, ILD chronic hypoxic respiratory failure. Patient is currently followed by hospice and is comfort care.  Assigned RN assesses NO: Deformities, contusions, abrasions, punctures, bruising, tears, lacerations, or swelling.  Patient has movement of extremities at baseline.  This was a Education administrator witnessed, RN regulated descent.   Interventions/ Plan   Follow institution required fall protocol. Continue all previous orders Continue assistance with mobility. Fall precautions.      Chinita Greenland BSN MSNA MSN ACNPC-AG Acute Care Nurse Practitioner [facility]

## 2023-02-21 NOTE — TOC Progression Note (Signed)
Transition of Care [facility]) - Progression Note    Patient Details  Name: Gary [name] MRN: [id number] Date of Birth: [birth year]  [facility] CM/SW Contact  Carley Hammed, LCSW Phone Number: 02/21/2023, 11:35 AM  Clinical Narrative:    CSW reviewed chart and was advised by medical team that pt wanted [facility], but was not considered GIP hospice at this time. Per conversation, plan is to wait 24 hours and review again as interventions are DC. MD noting pt to stay here until dispo is determined due to medical needs and high O2 needs.TOC will continue to follow for DC needs.         Expected Discharge Plan and Services       Living arrangements for the past 2 months: Single Family Home                                       Social Determinants of Health (SDOH) Interventions SDOH Screenings   Food Insecurity: No Food Insecurity (02/04/2023)  Housing: Low Risk  (02/04/2023)  Transportation Needs: No Transportation Needs (02/04/2023)  Utilities: Not At Risk (02/04/2023)  Social Connections: Moderately Isolated (02/04/2023)  Tobacco Use: Medium Risk (02/03/2023)    Readmission Risk Interventions    03/02/2022    4:16 PM  Readmission Risk Prevention Plan  Transportation Screening Complete  HRI or Home Care Consult Complete  Palliative Care Screening Not Applicable  Medication Review (RN Care Manager) Complete

## 2023-02-21 NOTE — Consult Note (Signed)
Palliative Medicine Inpatient Consult Note  Consulting Provider:  Talitha Givens, NP   Reason for consult:   Palliative Care Consult Services Palliative Medicine Consult  Reason for Consult? Assist with transition to comfort care   02/21/2023  HPI:  Per intake H&P -->  Patient is a 78 y.o.  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   The PMT has been asked to support additional end of life management.   Clinical Assessment/Goals of Care:  *Please note that this is a verbal dictation therefore any spelling or grammatical errors are due to the "Dragon Medical One" system interpretation.  I have reviewed medical records including EPIC notes, labs and imaging, received report from bedside RN, assessed the patient who is lying in bed, dyspneic when speaking.    I met with Gary [name] to further discuss diagnosis prognosis, GOC, EOL wishes, disposition and options.   I introduced Palliative Medicine as specialized medical care for people living with serious illness. It focuses on providing relief from the symptoms and stress of a serious illness. The goal is to improve quality of life for both the patient and the family.  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  Social History:  Gary [name] is from [facility].  He has been married to his wife, Gary [name] for the past 55 years.  They share 2 grown sons and 3 grandchildren.  He formally worked in [employer].  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  He expresses his extreme faith as he was raised as a [employer].  He values and love spending time with his family.  Functional and Nutritional State:  Preceding hospitalization, [name] was able to perform B ADLs and IADLs on his own.  He has had a consistent appetite.  Advance Directives:  A detailed discussion was had today regarding advanced directives.  Patient's spouse is a Runner, broadcasting/film/video.  Code Status:  Concepts specific to code status, artifical feeding and hydration, continued IV antibiotics and rehospitalization was had.  The difference between a aggressive medical intervention path  and a palliative comfort care path for this patient at this time was had.   Gary [name] is an established DO NOT RESUSCITATE DO NOT INTUBATE CODE STATUS.  Discussion:  [name] shares that he is aware that he has end-stage disease.  He expresses readiness to go on and understands at this time the goals are for comfort.  Given patient's profound dyspnea I did ask if he would be accepting of low-dose morphine around-the-clock which he shares he is.  We talked about transition to comfort measures in house and what that would entail inclusive of medications to control pain, dyspnea, agitation, nausea, itching, and hiccups. We discussed stopping all uneccessary measures such as cardiac monitoring, blood draws, needle sticks, and frequent vital signs. Utilized reflective listening throughout our time together.   Cardin's son is out of town and plans to come back on Wednesday.  He shares that his wife and children are aware of what is going on those grandchildren  or not.  This reasonably creates some upset.Allowed time for him to express himself, offered support through therapeutic listening.   Discussed the importance of continued conversation with family and their  medical providers regarding overall plan of  care and treatment options, ensuring decisions are within the context of the patients values and GOCs.  Decision Maker: Gary [name] (Spouse): [phone] (Mobile)   SUMMARY OF RECOMMENDATIONS   DNAR/DNI  Comfort Care  Initiate low dose morphine (1mg ) IV Q6H ATC --> given profound dyspnea  Additional comfort medications per [facility] will re-evaluate in the oncoming day(s)  Ongoing PMT support  Code Status/Advance Care Planning: DNAR/DNI  Palliative Prophylaxis:  Aspiration, Bowel Regimen, Delirium Protocol, Frequent Pain Assessment, Oral Care, Palliative Wound Care, and Turn Reposition  Additional Recommendations (Limitations, Scope, Preferences): Comfort Care  Psycho-social/Spiritual:  Desire for further Chaplaincy support: Yes Additional Recommendations: Education on EOL care   Prognosis: Limited to days - most especially if O2 is weaned  Discharge Planning: Discharge plan to be determined.   Vitals:   02/21/23 0521 02/21/23 0908  BP: 127/64 (!) 134/57  Pulse: 84 94  Resp: 20 17  Temp: 98.3 F (36.8 C) 99.2 F (37.3 C)  SpO2: 95% 98%   No intake or output data in the 24 hours ending 02/21/23 0981 Last Weight  Most recent update: 02/19/2023  6:57 AM    Weight  86.7 kg (191 lb 3.2 oz)            Gen:  Elderly Caucasian M in NAD HEENT: moist mucous membranes CV: Regular rate and irregular rhythm  PULM: On 10LPM [place], dyspnea with verbalization ABD: soft/nontender  EXT: No edema  Neuro: Alert and oriented x3   PPS: 30%   This conversation/these recommendations were discussed with patient primary care team, Dr. Thedore Mins  Billing based on MDM: High ______________________________________________________ Lamarr Lulas [facility] Palliative Medicine Team Team Cell Phone: [phone] Please utilize secure chat with additional questions, if there is no response within 30 minutes please call the above phone number  Palliative Medicine Team  providers are available by phone from 7am to 7pm daily and can be reached through the team cell phone.  Should this patient require assistance outside of these hours, please call the patient's attending physician.

## 2023-02-21 NOTE — Progress Notes (Signed)
2/17 Patient under Comfort Measures, IMM Letter respectfully not given.

## 2023-02-21 NOTE — Progress Notes (Signed)
Patient wanted to use the bedside commode for bowel movement, assisted twice through out the night till this time, the second time he was unsteady of the feet, so made him sit on the floor and called for help and helped him back to the bed with 2 assist. Patient did not hit the back or the head and the charge nurse was made aware of the incident and the hospital ist was made aware of the incident

## 2023-02-21 NOTE — Progress Notes (Signed)
02/21/2023     I have seen and evaluated the patient for AEILD   S:  No events, at peace with decision to transition to comfort care.    O: Blood pressure 96/66, pulse 64, temperature 97.6 F (36.4 C), temperature source Oral, resp. rate 17, height 5\' 3"  (1.6 m), weight 81.6 kg, SpO2 94 %.    No distress Minimal crackles bases Aox3, globally weak   A:  GPA in flare refractory to all therapies End of life   P:  Morphine PRN air hunger O2 for comfort Patient would like inpatient comfort care PCCM will be available PRN, sent message to MR     Myrla Halsted MD [facility] Prefer epic messenger for cross cover needs If after hours, please call E-link

## 2023-02-21 NOTE — Progress Notes (Signed)
PROGRESS NOTE        PATIENT DETAILS Name: Gary [name] Age: 78 y.o. Sex: male Date of Birth: [birth year] Admit Date: 02/03/2023 Admitting Physician Leroy Sea, MD ZOX:WRUEAV, [name], DO  Brief Summary: Patient is a 78 y.o.  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  Patient had a prolonged stay here was seen by pulmonary critical care, after 2 weeks of aggressive treatment he did not show any significant improvement, on 02/19/2023 he decided that he would transition to comfort care and look for residential hospice bed.  He has been transition to comfort medications only at this time.    Significant events: 1/30>> admit to [facility]  Significant studies: 1/30>> CXR: Worsening right-sided infiltrates. 2/06>> Echo:EF 60-65%, agitated saline contrast bubble study negative-normal evidence of interatrial shunt.  Significant microbiology data: 1/30>> respiratory virus panel: Negative 1/30>> COVID/influenza/RSV PCR: Negative  Procedures: None  Consults: PCCM, palliative care  Subjective: Patient in recliner, denies any headache or chest pain, continues to have shortness of breath, does not want to get any more blood draws and wants to go to a residential hospice.  Objective: Vitals: Blood pressure 127/64, pulse 84, temperature 98.3 F (36.8 C), temperature source Oral, resp. rate 20, height 5\' 10"  (1.778 m), weight 86.7 kg, SpO2 95%.   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 Although hypoxia slowly improving-he continues to have severe exertional dyspnea with minimal activity with significant drops in saturation-with slow but gradual recovery with rest.  He seems a bit more fatigued/tired today. Remains on CellCept ( dose doubled on 02/14/23) initially on IV Solu-Medrol thereafter switch to 60 mg of prednisone, since he declined he was switched back to IV Solu-Medrol on 02/18/2023, received Cytoxan infusion on 02/16/2023, initially improved but now worse again on 02/18/2023.  Leukocyte count has gotten worse, repeat chest x-ray, repeat a course of Rocephin and doxycycline, IV Lasix on 02/18/2023, replaced on IV Solu-Medrol fortunately no improvement.  After multiple discussions with patient, his wife, PCCM it was decided that he will be best served with comfort approach, he has chosen to stop all lab draws and non comfort medications were stopped by PCCM on 02/20/2023, he is awaiting a residential hospice bed where he wants to go.  Palliative care is going to see the patient shortly, case manager and social worker also involved.  He is now under full comfort care.  SpO2: 95 % O2 Flow Rate (L/min): 9 L/min FiO2 (%): (!) 6 %    History of granulomatosis with polyangiitis (GPA) Was on tapering steroids (down to 40 mg)-when he developed worsening respiratory failure  Prophylactic dapsone for PJP (no Bactrim as interaction with Tikosyn) Now only on comfort medications per PCCM   Paroxysmal atrial fibrillation Maintaining sinus rhythm Tikosyn/Coreg His anti-coagulation has been stopped by PCCM once he was transition to  comfort measures   Chronic HFpEF Euvolemic-on Lasix to ensure negative balance on as needed basis.   History of CAD s/p PCI September 2023 Anti-platelet medications stopped by PCCM   Iron deficiency anemia Was seen by GI this admission, not stable for any procedures.  Supportive care.   Hypothyroidism Was on Synthroid   Mood disorder Stable Continue Effexor   DM-2 with steroid related hyperglycemia Was on insulin long-acting and sliding scale stopped by [facility]   Recent Labs    02/20/23 0858 02/20/23 1231 02/20/23 1706  GLUCAP 139* 161* 190*     BMI: Estimated body mass index is 27.43 kg/m as calculated from the following:   Height as of this encounter: 5\' 10"  (1.778 m).   Weight as of this encounter: 86.7 kg.   Code status:   Code Status: Do not attempt resuscitation (DNR) - Comfort care   DVT Prophylaxis:     Family Communication:Spouse [name]-[phone] on 02/18/2023, called and left message on 02/20/2023 at 10:06 AM   Disposition Plan: Status is: Inpatient Remains inpatient appropriate because: Severity of illness   Planned Discharge Destination:Home health   Diet: Diet Order             Diet regular Fluid consistency: Thin  Diet effective now                     Antimicrobial agents: Anti-infectives (From admission, onward)    Start     Dose/Rate Route Frequency Ordered Stop   02/18/23 1230  doxycycline (VIBRA-TABS) tablet 100 mg        100 mg Oral Every 12 hours 02/18/23 1133 02/20/23 2359   02/18/23 1230  cefTRIAXone (ROCEPHIN) 1 g in sodium chloride 0.9 % 100 mL IVPB  Status:  Discontinued        1 g 200 mL/hr over 30 Minutes Intravenous Every 24 hours 02/18/23 1133 02/20/23 1730   02/03/23 1445  dapsone tablet 100 mg  Status:  Discontinued        100 mg Oral Daily 02/03/23 1442 02/20/23 1730        MEDICATIONS: Scheduled Meds:  ALPRAZolam  0.5 mg Oral QHS   carvedilol  3.125 mg Oral BID WC   dronabinol  5 mg Oral QAC  lunch   feeding supplement (GLUCERNA SHAKE)  237 mL Oral BID BM   polyethylene glycol  17 g Oral Daily   venlafaxine XR  150 mg Oral Daily   Continuous Infusions:     PRN Meds:.acetaminophen **OR** acetaminophen, bisacodyl, chlorpheniramine-HYDROcodone, diphenhydrAMINE, glycopyrrolate **OR** glycopyrrolate **OR** glycopyrrolate, haloperidol lactate, LORazepam, morphine injection, ondansetron **OR** ondansetron (ZOFRAN) IV, mouth rinse, polyvinyl alcohol   I have personally reviewed following labs and imaging studies  LABORATORY DATA:   Data Review:    Recent Labs  Lab 02/15/23 0501 02/15/23 1857 02/16/23 0456 02/17/23 0455 02/18/23 0842 02/19/23 0529  WBC 8.8 8.5 8.0 8.5 14.6* 7.7  HGB 8.7* 8.0* 8.3* 8.0* 9.3* 7.8*  HCT 30.2* 26.6* 28.4* 27.5* 32.6* 26.5*  PLT 395 335 367 394 555* 386  MCV 89.3 89.3 89.3 90.5 91.3 88.9  MCH 25.7* 26.8 26.1 26.3 26.1 26.2  MCHC 28.8* 30.1 29.2* 29.1* 28.5* 29.4*  RDW 25.2* 25.2* 24.7* 24.0* 24.2* 23.8*  LYMPHSABS 0.6*  --  0.4* 0.4* 0.4* 0.1*  MONOABS 0.4  --  0.3 0.4 0.2 0.1  EOSABS 0.0  --  0.0 0.0 0.0 0.0  BASOSABS 0.0  --  0.0 0.0 0.0 0.0    Recent Labs  Lab 02/15/23 0501 02/15/23 1857 02/16/23 0456 02/17/23 0455 02/18/23 0559 02/19/23 0529  NA 140 137 137 140 139 138  K 4.5 3.9 3.8 4.6 4.1 4.0  CL 99 97* 96* 97* 98 97*  CO2 29 29 30 31 28 29   ANIONGAP 12 11 11 12 13 12   GLUCOSE 143* 126* 101* 139* 67* 135*  BUN 36* 34* 32* 23 27* 28*  CREATININE 0.98 0.83 0.78 0.87 0.83 0.75  AST  --  33  --   --   --   --   ALT  --  59*  --   --   --   --   ALKPHOS  --  170*  --   --   --   --   BILITOT  --  0.5  --   --   --   --   ALBUMIN  --  2.6*  --   --   --   --   CRP 15.8*  --  12.0*  --   --   --   PROCALCITON 0.46  --  0.20 0.19  --  0.42  BNP 174.3*  --  106.4*  --   --  243.6*  MG 2.3  --  2.3  --  2.2 2.3  PHOS 3.3  --  3.6  --   --   --   CALCIUM 9.4 9.0 9.0 9.4 8.9 8.8*      Recent Labs  Lab 02/15/23 0501  02/15/23 1857 02/16/23 0456 02/17/23 0455 02/18/23 0559 02/19/23 0529  CRP 15.8*  --  12.0*  --   --   --   PROCALCITON 0.46  --  0.20 0.19  --  0.42  BNP 174.3*  --  106.4*  --   --  243.6*  MG 2.3  --  2.3  --  2.2 2.3  CALCIUM 9.4 9.0 9.0 9.4 8.9 8.8*    --------------------------------------------------------------------------------------------------------------- Lab Results  Component Value Date   CHOL 155 05/26/2020   HDL 45 05/26/2020   LDLCALC 79 05/26/2020   TRIG 184 (H) 05/26/2020   CHOLHDL 3.4 05/26/2020    Lab Results  Component Value Date   HGBA1C 5.9 (H) 01/02/2023   Radiology Reports DG Chest Port 1 View Result Date: 02/18/2023 CLINICAL DATA:  Shortness of breath. EXAM: PORTABLE CHEST 1 VIEW COMPARISON:  02/12/2023, CT 01/07/2023 FINDINGS: Prior median sternotomy. Stable heart size and mediastinal contours. Chronic interstitial lung disease with diffuse reticular opacities. No superimposed acute airspace disease. No pleural fluid or pneumothorax. IMPRESSION: Chronic interstitial lung disease. No superimposed acute finding. Electronically Signed   By: Narda Rutherford M.D.   On: 02/18/2023 15:47   DG Chest Port 1 View Result Date: 02/12/2023 CLINICAL DATA:  Interstitial lung disease EXAM: PORTABLE CHEST 1 VIEW COMPARISON:  02/09/2023 FINDINGS: Single frontal view of the chest demonstrates postsurgical changes from CABG. Cardiac silhouette is enlarged but stable. Diffuse interstitial lung disease is again identified, with bilateral fibrotic changes and scattered ground-glass opacities most pronounced in the right upper and left lower lung zones similar to prior CT exam. No new consolidation, effusion, or pneumothorax. No acute bony abnormalities. IMPRESSION: 1. Stable findings of chronic interstitial lung disease, with fibrosis and ground-glass opacities unchanged since recent high-resolution  chest CT 01/07/2023. 2. No evidence of acute intrathoracic process.  Electronically Signed   By: Sharlet Salina M.D.   On: 02/12/2023 15:32   ECHOCARDIOGRAM LIMITED BUBBLE STUDY Result Date: 02/10/2023    ECHOCARDIOGRAM LIMITED REPORT   Patient Name:   RAHN LACUESTA Date of Exam: 02/10/2023 Medical Rec #:  [id number]         Height:       70.0 in Accession #:    [id number]        Weight:       201.7 lb Date of Birth:  [birth year]          BSA:          2.095 m Patient Age:    77 years          BP:           149/81 mmHg Patient Gender: M                 HR:           75 bpm. Exam Location:  Inpatient Procedure: Limited Echo, Cardiac Doppler, Color Doppler and Saline Contrast            Bubble Study Indications:    ASD  History:        Patient has prior history of Echocardiogram examinations, most                 recent 12/07/2022. CAD; Risk Factors:Hypertension, Diabetes,                 Dyslipidemia, Former Smoker and Sleep Apnea.  Sonographer:    Karma Ganja Referring Phys: [id number] Vinnie Level SMITH IMPRESSIONS  1. Left ventricular ejection fraction, by estimation, is 60 to 65%. The left ventricle has normal function. The left ventricle has no regional wall motion abnormalities. There is moderate concentric left ventricular hypertrophy. Left ventricular diastolic function could not be evaluated.  2. Right ventricular systolic function is normal. The right ventricular size is normal. There is moderately elevated pulmonary artery systolic pressure.  3. The mitral valve is degenerative. Mild mitral valve regurgitation. No evidence of mitral stenosis. Moderate mitral annular calcification.  4. The aortic valve is calcified. There is mild calcification of the aortic valve. Aortic valve regurgitation is not visualized. Aortic valve sclerosis/calcification is present, without any evidence of aortic stenosis.  5. The inferior vena cava is normal in size with greater than 50% respiratory variability, suggesting right atrial pressure of 3 mmHg.  6. Agitated saline contrast bubble study was  negative, with no evidence of any interatrial shunt. FINDINGS  Left Ventricle: Left ventricular ejection fraction, by estimation, is 60 to 65%. The left ventricle has normal function. The left ventricle has no regional wall motion abnormalities. The left ventricular internal cavity size was normal in size. There is  moderate concentric left ventricular hypertrophy. Left ventricular diastolic function could not be evaluated. Left ventricular diastolic function could not be evaluated due to mitral annular calcification (moderate or greater). Right Ventricle: The right ventricular size is normal. No increase in right ventricular wall thickness. Right ventricular systolic function is normal. There is moderately elevated pulmonary artery systolic pressure. The tricuspid regurgitant velocity is 3.58 m/s, and with an assumed right atrial pressure of 3 mmHg, the estimated right ventricular systolic pressure is 54.3 mmHg. Left Atrium: Left atrial size was not assessed. Right Atrium: Right atrial size was not assessed. Pericardium: There is no evidence of pericardial effusion. Mitral Valve: The  mitral valve is degenerative in appearance. Moderate mitral annular calcification. Mild mitral valve regurgitation. No evidence of mitral valve stenosis. Tricuspid Valve: The tricuspid valve is normal in structure. Tricuspid valve regurgitation is mild . No evidence of tricuspid stenosis. Aortic Valve: The aortic valve is calcified. There is mild calcification of the aortic valve. Aortic valve regurgitation is not visualized. Aortic valve sclerosis/calcification is present, without any evidence of aortic stenosis. Pulmonic Valve: The pulmonic valve was not well visualized. Pulmonic valve regurgitation is not visualized. No evidence of pulmonic stenosis. Aorta: Aortic root could not be assessed. Venous: The inferior vena cava is normal in size with greater than 50% respiratory variability, suggesting right atrial pressure of 3 mmHg.  IAS/Shunts: No atrial level shunt detected by color flow Doppler. Agitated saline contrast was given intravenously to evaluate for intracardiac shunting. Agitated saline contrast bubble study was negative, with no evidence of any interatrial shunt. Additional Comments: Spectral Doppler performed. Color Doppler performed.  TRICUSPID VALVE TR Peak grad:   51.3 mmHg TR Vmax:        358.00 cm/s Lavona Mound Tobb DO Electronically signed by Thomasene Ripple DO Signature Date/Time: 02/10/2023/12:47:16 PM    Final    DG Chest Port 1 View Result Date: 02/09/2023 CLINICAL DATA:  Respiratory failure. EXAM: PORTABLE CHEST 1 VIEW COMPARISON:  02/03/2023 FINDINGS: Low volume film. The cardio pericardial silhouette is enlarged. Diffuse interstitial opacity again noted, progressive in the parahilar left mid lung. No substantial pleural effusion. No acute bony abnormality. Telemetry leads overlie the chest. IMPRESSION: Low volume film with progressive interstitial opacity in the parahilar left mid lung. Imaging features could be related to asymmetric edema or infection. Electronically Signed   By: Kennith Center M.D.   On: 02/09/2023 08:54   DG Chest Portable 1 View Result Date: 02/03/2023 CLINICAL DATA:  Shortness of breath. Hypoxia. History of Wegener's and interstitial lung disease. EXAM: PORTABLE CHEST 1 VIEW COMPARISON:  Radiographs 01/05/2023 and 01/01/2023.  CT 01/07/2023. FINDINGS: 1252 hours. The heart size and mediastinal contours are stable status post median sternotomy and CABG. Chronic interstitial lung disease again noted with possible slight worsening on the right which could indicate superimposed edema or inflammation. There is no consolidation, pneumothorax or significant pleural effusion. The bones appear unchanged. IMPRESSION: Chronic interstitial lung disease with possible slight worsening on the right which could indicate superimposed edema or inflammation. Electronically Signed   By: Carey Bullocks M.D.   On:  02/03/2023 14:01   CT Chest High Resolution Result Date: 01/07/2023 CLINICAL DATA:  Inpatient. Diffuse/interstitial lung disease ild flare EXAM: CT CHEST WITHOUT CONTRAST TECHNIQUE: Multidetector CT imaging of the chest was performed following the standard protocol without intravenous contrast. High resolution imaging of the lungs, as well as inspiratory and expiratory imaging, was performed. RADIATION DOSE REDUCTION: This exam was performed according to the departmental dose-optimization program which includes automated exposure control, adjustment of the mA and/or kV according to patient size and/or use of iterative reconstruction technique. COMPARISON:  01/05/2023 chest radiograph. 11/05/2022 high-resolution chest CT. FINDINGS: Cardiovascular: Stable mild cardiomegaly. No significant pericardial effusion/thickening. Three-vessel coronary atherosclerosis status post CABG. Atherosclerotic nonaneurysmal thoracic aorta. Normal caliber pulmonary arteries. Mediastinum/Nodes: No significant thyroid nodules. Unremarkable esophagus. No axillary adenopathy. Mild right paratracheal adenopathy up to 1.2 cm (series 5/image 45), unchanged. No new pathologically enlarged mediastinal nodes. No discrete hilar adenopathy on these noncontrast images. Lungs/Pleura: No pneumothorax. No pleural effusion. No acute consolidative airspace disease, lung masses or significant pulmonary nodules. Stable mild patchy air trapping  in both lungs on the expiration sequence, without evidence of tracheobronchomalacia. Extensive patchy peribronchovascular and subpleural reticulation and ground-glass opacity throughout both lungs with associated mild traction bronchiectasis and architectural distortion. No clear apicobasilar gradient to these findings. Scattered foci of mild honeycombing in both lungs, for example in the peripheral right lung on series 8/image 80 and in the peripheral lower left lung on series 8/image 98). Ground-glass opacities  have worsened since 11/05/2022 chest CT. Findings are otherwise not appreciably changed. Upper abdomen: Cholelithiasis. Musculoskeletal: No aggressive appearing focal osseous lesions. Intact sternotomy wires. Moderate thoracic spondylosis. IMPRESSION: 1. Spectrum of findings compatible with fibrotic interstitial lung disease with mild honeycombing, with worsened diffuse patchy ground-glass opacity in the interval since 11/05/2022 CT. Acute flare of chronic fibrotic interstitial lung disease favored. As previously described, chronic hypersensitivity pneumonitis or fibrotic NSIP are favored. Findings are suggestive of an alternative diagnosis (not UIP) per consensus guidelines: Diagnosis of Idiopathic Pulmonary Fibrosis: An Official ATS/ERS/JRS/ALAT Clinical Practice Guideline. Am Rosezetta Schlatter Crit Care Med Vol 198, Iss 5, [phone], Sep 04 2016. 2. Stable mild cardiomegaly. 3. Cholelithiasis. 4.  Aortic Atherosclerosis (ICD10-I70.0). Electronically Signed   By: Delbert Phenix M.D.   On: 01/07/2023 10:28   CARDIAC CATHETERIZATION Result Date: 01/06/2023 1. Normal filling pressures. 2. Mild pulmonary hypertension without elevated PVR. 3. Normal cardiac output.   VAS [place] LOWER EXTREMITY VENOUS (DVT) Result Date: 01/05/2023  Lower Venous DVT Study Patient Name:  AYREN ZUMBRO [facility]  Date of Exam:   01/05/2023 Medical Rec #: [id number]          Accession #:    [id number] Date of Birth: 07-05-1945           Patient Gender: M Patient Age:   46 years Exam Location:  [facility] Procedure:      VAS [place] LOWER EXTREMITY VENOUS (DVT) Referring Phys: Levon Hedger --------------------------------------------------------------------------------  Indications: Edema.  Anticoagulation: Coumadin. Limitations: Poor ultrasound/tissue interface. Performing Technologist: Ernestene Mention RVT, RDMS  Examination Guidelines: A complete evaluation includes B-mode imaging, spectral Doppler, color Doppler, and power Doppler as needed of all accessible  portions of each vessel. Bilateral testing is considered an integral part of a complete examination. Limited examinations for reoccurring indications may be performed as noted. The reflux portion of the exam is performed with the patient in reverse Trendelenburg.  +--------+---------------+---------+-----------+----------+--------------------+ RIGHT   CompressibilityPhasicitySpontaneityPropertiesThrombus Aging       +--------+---------------+---------+-----------+----------+--------------------+ CFV     Full           Yes      Yes                                       +--------+---------------+---------+-----------+----------+--------------------+ SFJ     Full                                                              +--------+---------------+---------+-----------+----------+--------------------+ FV Prox Full           Yes      Yes                                       +--------+---------------+---------+-----------+----------+--------------------+  FV Mid  Full           Yes      Yes                                       +--------+---------------+---------+-----------+----------+--------------------+ FV                     Yes      Yes                  patent by            Distal                                               color/doppler        +--------+---------------+---------+-----------+----------+--------------------+ PFV     Full                                                              +--------+---------------+---------+-----------+----------+--------------------+ POP     Full           Yes      Yes                                       +--------+---------------+---------+-----------+----------+--------------------+ PTV     Full                                                              +--------+---------------+---------+-----------+----------+--------------------+ PERO    Full                                                               +--------+---------------+---------+-----------+----------+--------------------+   +--------+---------------+---------+-----------+----------+--------------------+ LEFT    CompressibilityPhasicitySpontaneityPropertiesThrombus Aging       +--------+---------------+---------+-----------+----------+--------------------+ CFV     Full           Yes      Yes                                       +--------+---------------+---------+-----------+----------+--------------------+ SFJ     Full                                                              +--------+---------------+---------+-----------+----------+--------------------+ FV Prox Full           Yes  Yes                                       +--------+---------------+---------+-----------+----------+--------------------+ FV Mid  Full           Yes      Yes                                       +--------+---------------+---------+-----------+----------+--------------------+ FV                     Yes      Yes                  patent by            Distal                                               color/doppler        +--------+---------------+---------+-----------+----------+--------------------+ PFV     Full                                                              +--------+---------------+---------+-----------+----------+--------------------+ POP     Full           Yes      Yes                                       +--------+---------------+---------+-----------+----------+--------------------+ PTV     Full                                                              +--------+---------------+---------+-----------+----------+--------------------+ PERO    Full                                                              +--------+---------------+---------+-----------+----------+--------------------+     Summary: BILATERAL: - No evidence of deep vein thrombosis seen  in the lower extremities, bilaterally. -No evidence of popliteal cyst, bilaterally.   *See table(s) above for measurements and observations. Electronically signed by Lemar Livings MD on 01/05/2023 at 5:34:57 PM.    Final    DG Chest Port 1 View Result Date: 01/05/2023 CLINICAL DATA:  Acute respiratory failure. History of hypertension and coronary artery disease. EXAM: PORTABLE CHEST 1 VIEW COMPARISON:  01/01/2023 FINDINGS: Previous median sternotomy and CABG procedure. Lung volumes are low. Diffuse increased reticular and nodular interstitial opacities within both lungs compatible with previously characterized chronic fibrotic no superimposed pleural effusion, interstitial edema or airspace consolidation. Interstitial lung disease  IMPRESSION: 1. No acute findings. 2. Chronic interstitial lung disease. Electronically Signed   By: Signa Kell M.D.   On: 01/05/2023 12:24   DG Chest Port 1 View Result Date: 01/01/2023 CLINICAL DATA:  Shortness of breath. EXAM: PORTABLE CHEST 1 VIEW COMPARISON:  09/17/2022. FINDINGS: Low lung volume. There are diffuse increased interstitial markings, less pronounced than the prior radiograph from 10/04/2022, which corresponds to chronic interstitial lung disease on recent chest CT scan from 11/05/2022. Bilateral lung fields are otherwise clear. No acute consolidation or lung collapse. Bilateral costophrenic angles are clear. Stable cardio-mediastinal silhouette. Sternotomy wires noted, status post CABG. No acute osseous abnormalities. The soft tissues are within normal limits. IMPRESSION: *No acute cardiopulmonary abnormality.  Interstitial lung disease. Electronically Signed   By: Jules Schick M.D.   On: 01/01/2023 17:19   ECHOCARDIOGRAM COMPLETE Result Date: 12/07/2022    ECHOCARDIOGRAM REPORT   Patient Name:   DRAYKE GRABEL Date of Exam: 12/07/2022 Medical Rec #:  [id number]         Height:       68.3 in Accession #:    [id number]        Weight:       221.0 lb Date of  Birth:  09/30/1945          BSA:          2.139 m Patient Age:    77 years          BP:           128/80 mmHg Patient Gender: M                 HR:           81 bpm. Exam Location:  [address] Procedure: 2D Echo, 3D Echo, Cardiac Doppler and Color Doppler Indications:    R06.00 Dyspnea  History:        Patient has prior history of Echocardiogram examinations, most                 recent 03/02/2022. CHF, CAD, Prior CABG, Arrythmias:Atrial                 Fibrillation, Signs/Symptoms:Dyspnea; Risk Factors:Hypertension,                 Dyslipidemia, Former Smoker, Family History of Coronary Artery                 Disease and Sleep Apnea. Interstitial Lung Disease, Wegener's                 Granulomatosis.  Sonographer:    Farrel Conners RDCS Referring Phys: Kalman Shan IMPRESSIONS  1. Left ventricular ejection fraction, by estimation, is 60 to 65%. The left ventricle has normal function. The left ventricle has no regional wall motion abnormalities. Left ventricular diastolic parameters are consistent with Grade I diastolic dysfunction (impaired relaxation).  2. Mid systolic notching of the pulmonary artery pressure tracing consistent with pulmonary hypertension. RVSP estimated at . . Right ventricular systolic function is normal. The right ventricular size is mildly enlarged.  3. Left atrial size was moderately dilated.  4. Right atrial size was severely dilated.  5. The mitral valve is normal in structure. Trivial mitral valve regurgitation. No evidence of mitral stenosis. Moderate mitral annular calcification.  6. The aortic valve is normal in structure. Aortic valve regurgitation is not visualized. No aortic stenosis is present.  7. Aortic dilatation noted. There is mild dilatation of the ascending aorta,  measuring 41 mm.  8. The inferior vena cava is normal in size with greater than 50% respiratory variability, suggesting right atrial pressure of 3 mmHg. FINDINGS  Left Ventricle: Left ventricular  ejection fraction, by estimation, is 60 to 65%. The left ventricle has normal function. The left ventricle has no regional wall motion abnormalities. The left ventricular internal cavity size was normal in size. There is  no left ventricular hypertrophy. Left ventricular diastolic parameters are consistent with Grade I diastolic dysfunction (impaired relaxation). Right Ventricle: Mid systolic notching of the pulmonary artery pressure tracing consistent with pulmonary hypertension. RVSP estimated at . The right ventricular size is mildly enlarged. No increase in right ventricular wall thickness. Right ventricular systolic function is normal. Left Atrium: Left atrial size was moderately dilated. Right Atrium: Right atrial size was severely dilated. Pericardium: There is no evidence of pericardial effusion. Mitral Valve: The mitral valve is normal in structure. Moderate mitral annular calcification. Trivial mitral valve regurgitation. No evidence of mitral valve stenosis. MV peak gradient, 5.7 mmHg. The mean mitral valve gradient is 2.0 mmHg. Tricuspid Valve: The tricuspid valve is normal in structure. Tricuspid valve regurgitation is not demonstrated. No evidence of tricuspid stenosis. Aortic Valve: The aortic valve is normal in structure. Aortic valve regurgitation is not visualized. No aortic stenosis is present. Aortic valve mean gradient measures 7.0 mmHg. Aortic valve peak gradient measures 13.0 mmHg. Aortic valve area, by VTI measures 2.51 cm. Pulmonic Valve: The pulmonic valve was normal in structure. Pulmonic valve regurgitation is not visualized. No evidence of pulmonic stenosis. Aorta: Aortic dilatation noted and the aortic root is normal in size and structure. There is mild dilatation of the ascending aorta, measuring 41 mm. Venous: The inferior vena cava is normal in size with greater than 50% respiratory variability, suggesting right atrial pressure of 3 mmHg. IAS/Shunts: No atrial level shunt  detected by color flow Doppler.  LEFT VENTRICLE PLAX 2D LVIDd:         5.40 cm   Diastology LVIDs:         3.60 cm   LV e' medial:    5.87 cm/s LV PW:         1.10 cm   LV E/e' medial:  16.0 LV IVS:        1.10 cm   LV e' lateral:   10.30 cm/s LVOT diam:     2.60 cm   LV E/e' lateral: 9.1 LV SV:         99 LV SV Index:   46 LVOT Area:     5.31 cm                           3D Volume EF:                          3D EF:        60 %                          LV EDV:       187 ml                          LV ESV:       75 ml  LV SV:        113 ml RIGHT VENTRICLE RV Basal diam:  4.50 cm RV Mid diam:    3.50 cm RV S prime:     16.10 cm/s TAPSE (M-mode): 2.3 cm RVSP:           59.2 mmHg LEFT ATRIUM              Index        RIGHT ATRIUM           Index LA diam:        5.30 cm  2.48 cm/m   RA Pressure: 3.00 mmHg LA Vol (A2C):   79.1 ml  36.99 ml/m  RA Area:     31.20 cm LA Vol (A4C):   112.0 ml 52.37 ml/m  RA Volume:   122.00 ml 57.05 ml/m LA Biplane Vol: 98.6 ml  46.11 ml/m  AORTIC VALVE AV Area (Vmax):    2.48 cm AV Area (Vmean):   2.44 cm AV Area (VTI):     2.51 cm AV Vmax:           180.00 cm/s AV Vmean:          121.000 cm/s AV VTI:            0.393 m AV Peak Grad:      13.0 mmHg AV Mean Grad:      7.0 mmHg LVOT Vmax:         84.10 cm/s LVOT Vmean:        55.550 cm/s LVOT VTI:          0.186 m LVOT/AV VTI ratio: 0.47  AORTA Ao Root diam: 3.40 cm Ao Asc diam:  4.05 cm MITRAL VALVE               TRICUSPID VALVE MV Area (PHT): cm         TR Peak grad:   56.2 mmHg MV Area VTI:   2.87 cm    TR Vmax:        375.00 cm/s MV Peak grad:  5.7 mmHg    Estimated RAP:  3.00 mmHg MV Mean grad:  2.0 mmHg    RVSP:           59.2 mmHg MV Vmax:       1.19 m/s MV Vmean:      55.7 cm/s   SHUNTS MV Decel Time: 219 msec    Systemic VTI:  0.19 m MV E velocity: 94.10 cm/s  Systemic Diam: 2.60 cm MV A velocity: 67.15 cm/s MV E/A ratio:  1.40 Clearnce Hasten Electronically signed by Clearnce Hasten Signature  Date/Time: 12/07/2022/4:32:10 PM    Final       Signature  -   Susa Raring M.D on 02/21/2023 at 8:36 AM   -  To page go to [email or web address]

## 2023-02-22 DIAGNOSIS — J9621 Acute and chronic respiratory failure with hypoxia: Secondary | ICD-10-CM | POA: Diagnosis not present

## 2023-02-22 DIAGNOSIS — Z7189 Other specified counseling: Secondary | ICD-10-CM | POA: Diagnosis not present

## 2023-02-22 DIAGNOSIS — Z515 Encounter for palliative care: Secondary | ICD-10-CM | POA: Diagnosis not present

## 2023-02-22 MED ORDER — HALOPERIDOL LACTATE 5 MG/ML IJ SOLN
2.5000 mg | INTRAMUSCULAR | Status: DC | PRN
Start: 2023-02-22 — End: 2023-03-10

## 2023-02-22 MED ORDER — ONDANSETRON HCL 4 MG/2ML IJ SOLN: 4.0000 mg | Freq: Four times a day (QID) | INTRAMUSCULAR | Status: DC | PRN

## 2023-02-22 MED ORDER — MORPHINE BOLUS VIA INFUSION: 1.0000 mg | INTRAVENOUS | Status: DC | PRN

## 2023-02-22 MED ORDER — ACETAMINOPHEN 650 MG RE SUPP: 650.0000 mg | Freq: Four times a day (QID) | RECTAL | Status: DC | PRN

## 2023-02-22 MED ORDER — MORPHINE 100MG IN NS 100ML (1MG/ML) PREMIX INFUSION
1.0000 mg/h | INTRAVENOUS | Status: DC
Start: 2023-02-22 — End: 2023-03-10

## 2023-02-22 MED ORDER — GLYCOPYRROLATE 1 MG PO TABS: 1.0000 mg | ORAL_TABLET | ORAL | Status: DC | PRN

## 2023-02-22 MED ORDER — LORAZEPAM 2 MG/ML IJ SOLN: 2.0000 mg | INTRAMUSCULAR | Status: DC | PRN

## 2023-02-22 MED ORDER — POLYVINYL ALCOHOL 1.4 % OP SOLN: 1.0000 [drp] | Freq: Four times a day (QID) | OPHTHALMIC | Status: DC | PRN

## 2023-02-22 MED ORDER — ONDANSETRON 4 MG PO TBDP: 4.0000 mg | ORAL_TABLET | Freq: Four times a day (QID) | ORAL | Status: DC | PRN

## 2023-02-22 MED ORDER — GLYCOPYRROLATE 0.2 MG/ML IJ SOLN: 0.2000 mg | INTRAMUSCULAR | Status: DC | PRN

## 2023-02-22 MED ORDER — KETOROLAC TROMETHAMINE 15 MG/ML IJ SOLN
15.0000 mg | Freq: Four times a day (QID) | INTRAMUSCULAR | Status: DC
  Administered 2023-02-22: 15 mg via INTRAVENOUS
  Filled 2023-02-22: qty 1

## 2023-02-22 MED ORDER — MORPHINE 100MG IN NS 100ML (1MG/ML) PREMIX INFUSION
1.0000 mg/h | INTRAVENOUS | Status: DC
  Administered 2023-02-22: 1 mg/h via INTRAVENOUS
  Filled 2023-02-22: qty 100

## 2023-02-22 MED ORDER — ACETAMINOPHEN 325 MG PO TABS: 650.0000 mg | ORAL_TABLET | Freq: Four times a day (QID) | ORAL | Status: DC | PRN

## 2023-02-22 NOTE — Progress Notes (Addendum)
   Palliative Medicine Inpatient Follow Up Note  HPI: Patient is a 78 y.o.  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    The PMT has been asked to support additional end of life management.   Today's Discussion 02/22/2023  *Please note that this is a verbal dictation therefore any spelling or grammatical errors are due to the "Dragon [employer] One" system interpretation.  Chart reviewed inclusive of vital signs, progress notes, laboratory results, and diagnostic images.   I met with Celene Kras at bedside in the presence of his son this morning. Renji is somnolent and minimally responsive this morning. I shared openly and honestly that I feel he has entered the active phases of the dying process. I recommended that patients son call family to come in.  We discussed starting a morphine gtt for symptom management and weaning off of supplemental O2.   All oral medications have been stopped.   I spoke to patients RN who plans to start the morphine gtt this morning.  Tordal written for ATC for fevers.  Offered emotional support to patients son.   Questions and concerns addressed/Palliative Support Provided.   Objective Assessment: Vital Signs Vitals:   02/22/23 0509 02/22/23 0645  BP: 94/72   Pulse: (!) 131   Resp: 20   Temp: (!) 102.9 F (39.4 C) 99.2 F (37.3 C)  SpO2: 96%     Intake/Output Summary (Last 24 hours) at 02/22/2023 1010 Last data filed at 02/22/2023 0900 Gross per 24 hour  Intake 240 ml  Output 1950 ml  Net -1710 ml   Last Weight  Most recent update: 02/19/2023  6:57 AM    Weight  86.7 kg (191 lb 3.2 oz)             Gen:  Elderly Caucasian M in NAD HEENT: moist mucous membranes CV: Regular rate and irregular rhythm  PULM: On 6LPM [place], dyspnea with verbalization ABD: soft/nontender  EXT: No edema  Neuro: Alert and oriented x3   SUMMARY OF RECOMMENDATIONS   DNAR/DNI   Comfort Care   Initiate morphine gtt with boluses  Ketorolac 15mg  IVP Q6H for fevers   Additional comfort medications per [facility]   Anticipate in hospital death   Ongoing PMT support  Time Spent: 50 Billing based on MDM: High ______________________________________________________________________________________ Lamarr Lulas [place] Palliative Medicine Team Team Cell Phone: [phone] Please utilize secure chat with additional questions, if there is no response within 30 minutes please call the above phone number  Palliative Medicine Team providers are available by phone from 7am to 7pm daily and can be reached through the team cell phone.  Should this patient require assistance outside of these hours, please call the patient's attending physician.

## 2023-02-22 NOTE — TOC Transition Note (Signed)
Transition of Care [facility]) - Discharge Note   Patient Details  Name: Gary [name] MRN: [id number] Date of Birth: 11-26-1945  [facility] of [facility] CM/SW Contact:  Carley Hammed, LCSW Phone Number: 02/22/2023, 2:31 PM   Clinical Narrative:    Pt to be transported to [facility] via [place]. Nurse to call report to [phone]    Final next level of care: Hospice Medical Facility Barriers to Discharge: Barriers Resolved   Patient Goals and CMS Choice            Discharge Placement              Patient chooses bed at:  [facility]) Patient to be transferred to facility by: [facility] Name of family member notified: Son Patient and family notified of of transfer: 02/22/23  Discharge Plan and Services Additional resources added to the After Visit Summary for                                       Social Drivers of Health (SDOH) Interventions SDOH Screenings   Food Insecurity: No Food Insecurity (02/04/2023)  Housing: Low Risk  (02/04/2023)  Transportation Needs: No Transportation Needs (02/04/2023)  Utilities: Not At Risk (02/04/2023)  Social Connections: Moderately Isolated (02/04/2023)  Tobacco Use: Medium Risk (02/03/2023)     Readmission Risk Interventions    03/02/2022    4:16 PM  Readmission Risk Prevention Plan  Transportation Screening Complete  HRI or Home Care Consult Complete  Palliative Care Screening Not Applicable  Medication Review (RN Care Manager) Complete

## 2023-02-22 NOTE — Progress Notes (Addendum)
Report called to [facility] at [facility]. Advised they are ready for pt when he arrives.   1700: Morphine gtt stopped and remainder (77mL) wasted at pyxis with TL RN. Medication placed in appropriate receptor.   Pt in care of EMS en route to [facility].

## 2023-02-22 NOTE — Plan of Care (Signed)
  Problem: Fluid Volume: Goal: Ability to maintain a balanced intake and output will improve Outcome: Progressing   Problem: Education: Goal: Ability to describe self-care measures that may prevent or decrease complications (Diabetes Survival Skills Education) will improve Outcome: Not Progressing   Problem: Coping: Goal: Ability to adjust to condition or change in health will improve Outcome: Not Progressing   

## 2023-02-22 NOTE — Discharge Summary (Signed)
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   Hospital Course: The patient has undergone a prolonged hospital stay under the care of Pulmonary Critical Care Medicine and the [facility]. Despite 2 weeks of aggressive therapy he failed to show any sign of significant improvement. On 02/19/2023 the decision was made to transition to comfort focused care, in keeping with his clearly established prior wishes.  The medical team and the family all agreed entirely that this was the most appropriate intervention possible.  After transitioning to comfort focused care it was observed that the patient was resting comfortably and appeared to be at peace.  Arrangements were completed to honor the patient's desire to be transported to [facility] for his remaining days.  His supportive and loving family was at the bedside assuring his needs were met.   Allergies as of 02/22/2023       Reactions   Farxiga [dapagliflozin] Other (See Comments)   Lightheadedness Low blood pressure   Statins Other (See  Comments)   Myalgias         Medication List     STOP taking these medications    ALPRAZolam 0.5 MG tablet Commonly known as: XANAX   aspirin 81 MG chewable tablet   CALCIUM 600/VITAMIN D3 PO   carvedilol 25 MG tablet Commonly known as: COREG   Centrum Silver 50+Men Tabs   clopidogrel 75 MG tablet Commonly known as: PLAVIX   COQ10 PO   dapsone 100 MG tablet   dofetilide 500 MCG capsule Commonly known as: TIKOSYN   ferrous sulfate 325 (65 FE) MG EC tablet   furosemide 40 MG tablet Commonly known as: Lasix   levothyroxine 137 MCG tablet Commonly known as: SYNTHROID   loratadine 10 MG tablet Commonly known as: CLARITIN   metFORMIN 500 MG tablet Commonly known as: GLUCOPHAGE   nitroGLYCERIN 0.4 MG SL tablet Commonly known as: NITROSTAT   OZEMPIC (0.25 OR 0.5 MG/DOSE) [place]   pantoprazole 40 MG tablet Commonly known as: PROTONIX   Pirfenidone 267 MG Tabs   potassium chloride SA 20 MEQ tablet Commonly known as: KLOR-CON M   predniSONE 20 MG tablet Commonly known as: DELTASONE   Repatha SureClick 140 MG/ML Soaj Generic drug: Evolocumab   RITUXAN IV   testosterone cypionate 200 MG/ML injection Commonly known as: DEPOTESTOSTERONE CYPIONATE   venlafaxine XR 150 MG 24 hr capsule Commonly known as: EFFEXOR-XR   Vitamin D-3 125 MCG (5000 UT) Tabs   warfarin 5 MG tablet Commonly known as: COUMADIN       TAKE these medications    acetaminophen 325 MG tablet Commonly known as: TYLENOL Take 2 tablets (650 mg total) by mouth every 6 (six) hours as needed for mild pain (pain score 1-3) (or Fever >/=  101).   acetaminophen 650 MG suppository Commonly known as: TYLENOL Place 1 suppository (650 mg total) rectally every 6 (six) hours as needed for mild pain (pain score 1-3) (or Fever >/= 101).   glycopyrrolate 1 MG tablet Commonly known as: ROBINUL Take 1 tablet (1 mg total) by mouth every 4 (four) hours as needed (excessive secretions).    glycopyrrolate 0.2 MG/ML injection Commonly known as: ROBINUL Inject 1 mL (0.2 mg total) into the skin every 4 (four) hours as needed (excessive secretions).   glycopyrrolate 0.2 MG/ML injection Commonly known as: ROBINUL Inject 1 mL (0.2 mg total) into the vein every 4 (four) hours as needed (excessive secretions).   haloperidol lactate 5 MG/ML injection Commonly known as: HALDOL Inject 0.5-1 mLs (2.5-5 mg total) into the vein every 4 (four) hours as needed (or psychosis).   LORazepam 2 MG/ML injection Commonly known as: ATIVAN Inject 1-2 mLs (2-4 mg total) into the vein every 4 (four) hours as needed for anxiety or seizure.   morphine 1 mg/mL Soln infusion Inject 1-10 mg/hr into the vein continuous.   morphine 5 mg/mL Soln Inject 1 mg into the vein every 10 (ten) minutes as needed (Pain, Shortness of breath).   ondansetron 4 MG disintegrating tablet Commonly known as: ZOFRAN-ODT Take 1 tablet (4 mg total) by mouth every 6 (six) hours as needed for nausea.   ondansetron 4 MG/2ML Soln injection Commonly known as: ZOFRAN Inject 2 mLs (4 mg total) into the vein every 6 (six) hours as needed for nausea.   polyvinyl alcohol 1.4 % ophthalmic solution Commonly known as: LIQUIFILM TEARS Place 1 drop into both eyes 4 (four) times daily as needed for dry eyes.        Day of Discharge BP 94/72 (BP Location: Right Arm)   Pulse (!) 131   Temp 99.2 F (37.3 C) Comment: Ax  Resp 20   Ht 5\' 10"  (1.778 m)   Wt 86.7 kg   SpO2 96%   BMI 27.43 kg/m   Physical Exam: General: No acute respiratory distress Lungs: Clear to auscultation bilaterally without wheezes or crackles Cardiovascular: Regular rate and rhythm without murmur gallop or rub normal S1 and S2 Abdomen: Nontender, nondistended, soft, bowel sounds positive, no rebound, no ascites, no appreciable mass Extremities: No significant cyanosis, clubbing, or edema bilateral lower extremities  Basic Metabolic Panel: Recent  Labs  Lab 02/15/23 1857 02/16/23 0456 02/17/23 0455 02/18/23 0559 02/19/23 0529  NA 137 137 140 139 138  K 3.9 3.8 4.6 4.1 4.0  CL 97* 96* 97* 98 97*  CO2 29 30 31 28 29   GLUCOSE 126* 101* 139* 67* 135*  BUN 34* 32* 23 27* 28*  CREATININE 0.83 0.78 0.87 0.83 0.75  CALCIUM 9.0 9.0 9.4 8.9 8.8*  MG  --  2.3  --  2.2 2.3  PHOS  --  3.6  --   --   --     CBC: Recent Labs  Lab 02/15/23 1857 02/16/23 0456 02/17/23 0455 02/18/23 0842 02/19/23 0529  WBC 8.5 8.0 8.5 14.6* 7.7  NEUTROABS  --  7.2 7.7 13.8* 7.5  HGB 8.0* 8.3* 8.0* 9.3* 7.8*  HCT 26.6* 28.4* 27.5* 32.6* 26.5*  MCV 89.3 89.3 90.5 91.3 88.9  PLT 335 367 394 555* 386    Time spent in discharge (includes decision making & examination of pt): 35 minutes  02/22/2023, 2:12 PM   Lonia Blood, MD [facility] Office  [phone]

## 2023-02-23 ENCOUNTER — Other Ambulatory Visit: Payer: Self-pay

## 2023-02-23 NOTE — Progress Notes (Signed)
Disenrolling - patient stopped therapy and is one hospice care. Specialty call center confirmed with clinic pharm okay to disenroll.

## 2023-02-28 ENCOUNTER — Encounter: Payer: Self-pay | Admitting: Internal Medicine

## 2023-03-02 ENCOUNTER — Ambulatory Visit (HOSPITAL_COMMUNITY): Payer: Medicare Other | Admitting: Internal Medicine

## 2023-03-09 ENCOUNTER — Other Ambulatory Visit: Payer: Self-pay | Admitting: Cardiovascular Disease

## 2023-03-10 ENCOUNTER — Inpatient Hospital Stay: Payer: Medicare Other | Admitting: Internal Medicine

## 2023-03-28 ENCOUNTER — Ambulatory Visit: Payer: Medicare Other | Admitting: Cardiovascular Disease

## 2023-03-31 ENCOUNTER — Ambulatory Visit (HOSPITAL_BASED_OUTPATIENT_CLINIC_OR_DEPARTMENT_OTHER): Payer: Medicare Other | Admitting: Cardiovascular Disease

## 2023-05-03 ENCOUNTER — Other Ambulatory Visit (HOSPITAL_COMMUNITY): Payer: Self-pay | Admitting: Internal Medicine

## 2023-08-02 ENCOUNTER — Other Ambulatory Visit (HOSPITAL_COMMUNITY): Payer: Self-pay
